# Patient Record
Sex: Female | Born: 1935 | Race: Black or African American | Hispanic: No | Smoking: Never smoker
Health system: Southern US, Community
[De-identification: ages and names within clinical notes are randomized; demographics above are authoritative.]

## PROBLEM LIST (undated history)

## (undated) DIAGNOSIS — B029 Zoster without complications: Secondary | ICD-10-CM

## (undated) DIAGNOSIS — H409 Unspecified glaucoma: Secondary | ICD-10-CM

## (undated) DIAGNOSIS — M1990 Unspecified osteoarthritis, unspecified site: Secondary | ICD-10-CM

## (undated) DIAGNOSIS — N63 Unspecified lump in unspecified breast: Secondary | ICD-10-CM

## (undated) DIAGNOSIS — E119 Type 2 diabetes mellitus without complications: Secondary | ICD-10-CM

## (undated) DIAGNOSIS — I10 Essential (primary) hypertension: Secondary | ICD-10-CM

## (undated) DIAGNOSIS — Z1239 Encounter for other screening for malignant neoplasm of breast: Secondary | ICD-10-CM

## (undated) DIAGNOSIS — Z87442 Personal history of urinary calculi: Secondary | ICD-10-CM

## (undated) DIAGNOSIS — M109 Gout, unspecified: Secondary | ICD-10-CM

## (undated) DIAGNOSIS — M810 Age-related osteoporosis without current pathological fracture: Secondary | ICD-10-CM

## (undated) DIAGNOSIS — K219 Gastro-esophageal reflux disease without esophagitis: Secondary | ICD-10-CM

## (undated) DIAGNOSIS — I499 Cardiac arrhythmia, unspecified: Secondary | ICD-10-CM

## (undated) DIAGNOSIS — D649 Anemia, unspecified: Secondary | ICD-10-CM

## (undated) DIAGNOSIS — E669 Obesity, unspecified: Secondary | ICD-10-CM

## (undated) DIAGNOSIS — Z1211 Encounter for screening for malignant neoplasm of colon: Secondary | ICD-10-CM

## (undated) DIAGNOSIS — R928 Other abnormal and inconclusive findings on diagnostic imaging of breast: Secondary | ICD-10-CM

## (undated) DIAGNOSIS — E785 Hyperlipidemia, unspecified: Secondary | ICD-10-CM

## (undated) HISTORY — PX: CATARACT EXTRACTION, BILATERAL: SHX1313

## (undated) HISTORY — DX: Other abnormal and inconclusive findings on diagnostic imaging of breast: R92.8

## (undated) HISTORY — DX: Type 2 diabetes mellitus without complications: E11.9

## (undated) HISTORY — PX: KNEE SURGERY: SHX244

## (undated) HISTORY — DX: Encounter for screening for malignant neoplasm of colon: Z12.11

## (undated) HISTORY — DX: Zoster without complications: B02.9

## (undated) HISTORY — DX: Age-related osteoporosis without current pathological fracture: M81.0

## (undated) HISTORY — PX: BACK SURGERY: SHX140

## (undated) HISTORY — PX: TONSILLECTOMY: SUR1361

## (undated) HISTORY — PX: EYE SURGERY: SHX253

## (undated) HISTORY — PX: CHOLECYSTECTOMY: SHX55

## (undated) HISTORY — PX: CORONARY ARTERY BYPASS GRAFT: SHX141

## (undated) HISTORY — DX: Essential (primary) hypertension: I10

## (undated) HISTORY — DX: Gastro-esophageal reflux disease without esophagitis: K21.9

## (undated) HISTORY — DX: Hyperlipidemia, unspecified: E78.5

## (undated) HISTORY — DX: Unspecified lump in unspecified breast: N63.0

## (undated) HISTORY — DX: Unspecified glaucoma: H40.9

## (undated) HISTORY — PX: BREAST EXCISIONAL BIOPSY: SUR124

## (undated) HISTORY — DX: Encounter for other screening for malignant neoplasm of breast: Z12.39

## (undated) HISTORY — DX: Obesity, unspecified: E66.9

---
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
## 1978-10-21 DIAGNOSIS — I10 Essential (primary) hypertension: Secondary | ICD-10-CM

## 1978-10-21 HISTORY — DX: Essential (primary) hypertension: I10

## 1996-02-20 DIAGNOSIS — E119 Type 2 diabetes mellitus without complications: Secondary | ICD-10-CM

## 1996-02-20 HISTORY — PX: LIPOMA EXCISION: SHX5283

## 1996-02-20 HISTORY — DX: Type 2 diabetes mellitus without complications: E11.9

## 2001-02-19 DIAGNOSIS — H409 Unspecified glaucoma: Secondary | ICD-10-CM

## 2001-02-19 HISTORY — DX: Unspecified glaucoma: H40.9

## 2001-02-19 HISTORY — PX: COLONOSCOPY: SHX174

## 2002-02-19 HISTORY — PX: SPINE SURGERY: SHX786

## 2004-05-27 ENCOUNTER — Emergency Department: Payer: Self-pay | Admitting: Emergency Medicine

## 2004-06-07 ENCOUNTER — Encounter: Payer: Self-pay | Admitting: Family Medicine

## 2004-06-07 ENCOUNTER — Emergency Department: Payer: Self-pay | Admitting: Emergency Medicine

## 2004-06-13 ENCOUNTER — Ambulatory Visit: Payer: Self-pay | Admitting: Family Medicine

## 2004-06-19 ENCOUNTER — Encounter: Payer: Self-pay | Admitting: Family Medicine

## 2004-07-20 ENCOUNTER — Encounter: Payer: Self-pay | Admitting: Family Medicine

## 2004-11-05 ENCOUNTER — Emergency Department: Payer: Self-pay | Admitting: Internal Medicine

## 2005-07-24 ENCOUNTER — Ambulatory Visit: Payer: Self-pay | Admitting: Specialist

## 2005-08-02 ENCOUNTER — Ambulatory Visit: Payer: Self-pay | Admitting: Specialist

## 2005-08-02 ENCOUNTER — Other Ambulatory Visit: Payer: Self-pay

## 2005-08-10 ENCOUNTER — Ambulatory Visit: Payer: Self-pay | Admitting: Specialist

## 2006-02-19 DIAGNOSIS — E785 Hyperlipidemia, unspecified: Secondary | ICD-10-CM

## 2006-02-19 HISTORY — DX: Hyperlipidemia, unspecified: E78.5

## 2007-02-24 ENCOUNTER — Ambulatory Visit: Payer: Self-pay | Admitting: Specialist

## 2007-03-04 ENCOUNTER — Inpatient Hospital Stay: Payer: Self-pay | Admitting: Specialist

## 2007-05-28 ENCOUNTER — Ambulatory Visit: Payer: Self-pay | Admitting: Family Medicine

## 2008-10-14 ENCOUNTER — Ambulatory Visit: Payer: Self-pay | Admitting: Specialist

## 2009-04-19 ENCOUNTER — Emergency Department: Payer: Self-pay | Admitting: Emergency Medicine

## 2009-07-18 ENCOUNTER — Encounter: Payer: Self-pay | Admitting: Internal Medicine

## 2009-07-20 ENCOUNTER — Encounter: Payer: Self-pay | Admitting: Internal Medicine

## 2009-08-19 ENCOUNTER — Encounter: Payer: Self-pay | Admitting: Internal Medicine

## 2010-06-28 ENCOUNTER — Encounter: Payer: Self-pay | Admitting: Orthopaedic Surgery

## 2010-07-04 ENCOUNTER — Ambulatory Visit: Payer: Self-pay | Admitting: Family Medicine

## 2010-07-21 ENCOUNTER — Encounter: Payer: Self-pay | Admitting: Orthopaedic Surgery

## 2010-12-04 ENCOUNTER — Encounter: Payer: Self-pay | Admitting: Orthopaedic Surgery

## 2010-12-21 ENCOUNTER — Encounter: Payer: Self-pay | Admitting: Orthopaedic Surgery

## 2011-01-20 ENCOUNTER — Encounter: Payer: Self-pay | Admitting: Orthopaedic Surgery

## 2011-02-20 DIAGNOSIS — R928 Other abnormal and inconclusive findings on diagnostic imaging of breast: Secondary | ICD-10-CM

## 2011-02-20 DIAGNOSIS — Z1211 Encounter for screening for malignant neoplasm of colon: Secondary | ICD-10-CM

## 2011-02-20 DIAGNOSIS — B029 Zoster without complications: Secondary | ICD-10-CM

## 2011-02-20 DIAGNOSIS — E669 Obesity, unspecified: Secondary | ICD-10-CM

## 2011-02-20 DIAGNOSIS — Z1239 Encounter for other screening for malignant neoplasm of breast: Secondary | ICD-10-CM

## 2011-02-20 HISTORY — DX: Encounter for other screening for malignant neoplasm of breast: Z12.39

## 2011-02-20 HISTORY — DX: Obesity, unspecified: E66.9

## 2011-02-20 HISTORY — DX: Encounter for screening for malignant neoplasm of colon: Z12.11

## 2011-02-20 HISTORY — DX: Zoster without complications: B02.9

## 2011-02-20 HISTORY — DX: Other abnormal and inconclusive findings on diagnostic imaging of breast: R92.8

## 2011-06-14 ENCOUNTER — Ambulatory Visit: Payer: Self-pay | Admitting: Family Medicine

## 2011-06-21 ENCOUNTER — Ambulatory Visit: Payer: Self-pay | Admitting: Family Medicine

## 2011-07-28 ENCOUNTER — Emergency Department: Payer: Self-pay | Admitting: Emergency Medicine

## 2011-08-27 ENCOUNTER — Ambulatory Visit: Payer: Self-pay | Admitting: Family Medicine

## 2011-11-21 ENCOUNTER — Ambulatory Visit: Payer: Self-pay | Admitting: Family Medicine

## 2011-12-26 ENCOUNTER — Ambulatory Visit: Payer: Self-pay | Admitting: General Surgery

## 2011-12-31 ENCOUNTER — Ambulatory Visit: Payer: Self-pay | Admitting: Family Medicine

## 2012-01-03 DIAGNOSIS — N63 Unspecified lump in unspecified breast: Secondary | ICD-10-CM

## 2012-01-03 HISTORY — DX: Unspecified lump in unspecified breast: N63.0

## 2012-01-04 ENCOUNTER — Ambulatory Visit: Payer: Self-pay | Admitting: Family Medicine

## 2012-01-25 HISTORY — PX: BREAST BIOPSY: SHX20

## 2012-03-26 ENCOUNTER — Ambulatory Visit: Payer: Self-pay | Admitting: Family Medicine

## 2012-05-21 ENCOUNTER — Encounter: Payer: Self-pay | Admitting: General Surgery

## 2012-06-02 ENCOUNTER — Encounter: Payer: Self-pay | Admitting: *Deleted

## 2012-06-02 DIAGNOSIS — N6310 Unspecified lump in the right breast, unspecified quadrant: Secondary | ICD-10-CM | POA: Insufficient documentation

## 2012-06-02 DIAGNOSIS — N63 Unspecified lump in unspecified breast: Secondary | ICD-10-CM | POA: Insufficient documentation

## 2012-06-11 ENCOUNTER — Ambulatory Visit: Payer: Self-pay | Admitting: Family Medicine

## 2012-06-24 ENCOUNTER — Ambulatory Visit: Payer: Self-pay | Admitting: General Surgery

## 2012-06-25 ENCOUNTER — Encounter: Payer: Self-pay | Admitting: General Surgery

## 2012-07-09 ENCOUNTER — Ambulatory Visit (INDEPENDENT_AMBULATORY_CARE_PROVIDER_SITE_OTHER): Payer: Medicare Other | Admitting: General Surgery

## 2012-07-09 ENCOUNTER — Encounter: Payer: Self-pay | Admitting: General Surgery

## 2012-07-09 VITALS — BP 140/74 | HR 72 | Resp 14 | Ht 62.0 in | Wt 206.0 lb

## 2012-07-09 DIAGNOSIS — N63 Unspecified lump in unspecified breast: Secondary | ICD-10-CM

## 2012-07-09 NOTE — Progress Notes (Signed)
Patient ID: Taylor [name], female   DOB: 1932/11/04, 79 y.o.   MRN: [id number]  Chief Complaint  Patient presents with  . Follow-up    mammogram    HPI Taylor [name] is a 79 y.o. female here today for her follow up mammogram done on 06/24/12 cat 2. Patient dose perform self breast checks and get regular mammograms.No family history of breast cancers. Patient has not problems at this time. HPI  Past Medical History  Diagnosis Date  . Diabetes mellitus without complication 1998    non insulin dependent  . Glaucoma 2003  . Hypertension 1980's  . Abnormal mammogram, unspecified 2013  . Shingles 2013  . Osteoporosis   . Breast screening, unspecified 2013  . Lump or mass in breast 01/03/2012    left breast  . Special screening for malignant neoplasms, colon 2013  . Obesity, unspecified 2013  . Hyperlipidemia 2008  . GERD (gastroesophageal reflux disease)     Past Surgical History  Procedure Laterality Date  . Abdominal hysterectomy  1958    menorrhagia  . Knee surgery  2009,2011,2012    twice on right and once on left  . Lipoma excision  1998  . Spine surgery  2004  . Tonsillectomy      age of 53  . Colonoscopy  2003    [facility]  . Coronary artery bypass graft Left 01/03/2012,01/24/2012    Left FNA, Left breast [facility] bx    Family History  Problem Relation Age of Onset  . Cancer Mother     skin cancer    Social History History  Substance Use Topics  . Smoking status: Never Smoker   . Smokeless tobacco: Never Used  . Alcohol Use: No    No Known Allergies  Current Outpatient Prescriptions  Medication Sig Dispense Refill  . amLODipine-benazepril (LOTREL) 10-20 MG per capsule Take 1 capsule by mouth daily.      . bimatoprost (LUMIGAN) 0.01 % SOLN 1 drop daily.      . metFORMIN (GLUCOPHAGE) 500 MG tablet Take 500 mg by mouth daily.      . pravastatin (PRAVACHOL) 40 MG tablet Take 40 mg by mouth daily.      . quinapril (ACCUPRIL) 40 MG tablet Take 40 mg by mouth daily.        No current facility-administered medications for this visit.    Review of Systems Review of Systems  Constitutional: Negative.   Respiratory: Negative.   Cardiovascular: Negative.     Blood pressure 140/74, pulse 72, resp. rate 14, height 5\' 2"  (1.575 m), weight 206 lb (93.441 kg).  Physical Exam Physical Exam  Constitutional: She appears well-developed and well-nourished.  Eyes: Conjunctivae are normal.  Neck: Neck supple.  Cardiovascular: Normal rate and normal heart sounds.   Pulmonary/Chest: Breath sounds normal.   Right breast exhibits no inverted nipple, no mass, no nipple discharge, no skin change and no tenderness. Left breast exhibits no inverted nipple, no mass, no nipple discharge, no skin change and no tenderness. Breasts are asymmetrical (right bigger than left two cup sizes differents.).  8 cm long scar on back   Lymphadenopathy:    She has no cervical adenopathy.    She has no axillary adenopathy.    Data Reviewed Bilateral mammograms dated 06/24/2012 showed the previously identified nodular density in the left breast to be associated with a biopsy clip. No interval new findings. BI-RAD-2.  The left breast biopsy dated 12 6, 2013 showed fibroadenomatous changes with usual ductal hyperplasia.  No atypia or malignancy. Assessment    Benign breast exam    Plan    The patient will continue annual clinical and mammographic examinations with her primary care provider, Dennison Mascot, M.D.       Earline Mayotte 07/10/2012, 7:09 AM

## 2012-07-09 NOTE — Patient Instructions (Signed)
Patient to return as needed. 

## 2012-07-10 ENCOUNTER — Encounter: Payer: Self-pay | Admitting: General Surgery

## 2012-07-10 DIAGNOSIS — K429 Umbilical hernia without obstruction or gangrene: Secondary | ICD-10-CM | POA: Insufficient documentation

## 2012-07-10 DIAGNOSIS — K5000 Crohn's disease of small intestine without complications: Secondary | ICD-10-CM | POA: Insufficient documentation

## 2012-09-02 ENCOUNTER — Ambulatory Visit: Payer: Self-pay | Admitting: Family Medicine

## 2013-06-29 ENCOUNTER — Ambulatory Visit: Payer: Self-pay | Admitting: Family Medicine

## 2013-07-06 ENCOUNTER — Ambulatory Visit: Payer: Self-pay | Admitting: Family Medicine

## 2013-08-13 ENCOUNTER — Ambulatory Visit: Payer: Self-pay | Admitting: Family Medicine

## 2013-09-08 ENCOUNTER — Ambulatory Visit: Payer: Self-pay | Admitting: Family Medicine

## 2013-12-21 ENCOUNTER — Encounter: Payer: Self-pay | Admitting: General Surgery

## 2014-03-16 DIAGNOSIS — E780 Pure hypercholesterolemia: Secondary | ICD-10-CM | POA: Diagnosis not present

## 2014-03-16 DIAGNOSIS — E669 Obesity, unspecified: Secondary | ICD-10-CM | POA: Diagnosis not present

## 2014-03-16 DIAGNOSIS — E119 Type 2 diabetes mellitus without complications: Secondary | ICD-10-CM | POA: Diagnosis not present

## 2014-03-16 DIAGNOSIS — E1140 Type 2 diabetes mellitus with diabetic neuropathy, unspecified: Secondary | ICD-10-CM | POA: Diagnosis not present

## 2014-03-16 DIAGNOSIS — I10 Essential (primary) hypertension: Secondary | ICD-10-CM | POA: Diagnosis not present

## 2014-03-29 ENCOUNTER — Ambulatory Visit: Payer: Self-pay | Admitting: Family Medicine

## 2014-03-29 DIAGNOSIS — Z713 Dietary counseling and surveillance: Secondary | ICD-10-CM | POA: Diagnosis not present

## 2014-03-29 DIAGNOSIS — E119 Type 2 diabetes mellitus without complications: Secondary | ICD-10-CM | POA: Diagnosis not present

## 2014-04-20 ENCOUNTER — Ambulatory Visit
Admit: 2014-04-20 | Disposition: A | Discharge status: Home / Self Care | Payer: Self-pay | Attending: Family Medicine | Admitting: Family Medicine

## 2014-05-03 DIAGNOSIS — Z96652 Presence of left artificial knee joint: Secondary | ICD-10-CM | POA: Diagnosis not present

## 2014-05-03 DIAGNOSIS — Z471 Aftercare following joint replacement surgery: Secondary | ICD-10-CM | POA: Diagnosis not present

## 2014-05-03 DIAGNOSIS — M25561 Pain in right knee: Secondary | ICD-10-CM | POA: Diagnosis not present

## 2014-05-03 DIAGNOSIS — Z96651 Presence of right artificial knee joint: Secondary | ICD-10-CM | POA: Diagnosis not present

## 2014-05-03 DIAGNOSIS — M79671 Pain in right foot: Secondary | ICD-10-CM | POA: Diagnosis not present

## 2014-05-20 DIAGNOSIS — H4011X3 Primary open-angle glaucoma, severe stage: Secondary | ICD-10-CM | POA: Diagnosis not present

## 2014-05-20 DIAGNOSIS — H2513 Age-related nuclear cataract, bilateral: Secondary | ICD-10-CM | POA: Diagnosis not present

## 2014-05-20 DIAGNOSIS — H524 Presbyopia: Secondary | ICD-10-CM | POA: Diagnosis not present

## 2014-06-10 DIAGNOSIS — I10 Essential (primary) hypertension: Secondary | ICD-10-CM | POA: Diagnosis not present

## 2014-06-10 DIAGNOSIS — G894 Chronic pain syndrome: Secondary | ICD-10-CM | POA: Diagnosis not present

## 2014-06-10 DIAGNOSIS — E1140 Type 2 diabetes mellitus with diabetic neuropathy, unspecified: Secondary | ICD-10-CM | POA: Diagnosis not present

## 2014-06-10 DIAGNOSIS — E780 Pure hypercholesterolemia: Secondary | ICD-10-CM | POA: Diagnosis not present

## 2014-06-10 DIAGNOSIS — G629 Polyneuropathy, unspecified: Secondary | ICD-10-CM | POA: Diagnosis not present

## 2014-06-21 ENCOUNTER — Other Ambulatory Visit: Payer: Self-pay

## 2014-06-21 DIAGNOSIS — Z1231 Encounter for screening mammogram for malignant neoplasm of breast: Secondary | ICD-10-CM

## 2014-07-09 ENCOUNTER — Other Ambulatory Visit: Payer: Self-pay

## 2014-07-09 ENCOUNTER — Ambulatory Visit
Admission: RE | Admit: 2014-07-09 | Discharge: 2014-07-09 | Disposition: A | Discharge status: Home / Self Care | Payer: Medicare Other | Source: Ambulatory Visit | Attending: Family Medicine | Admitting: Family Medicine

## 2014-07-09 DIAGNOSIS — Z1231 Encounter for screening mammogram for malignant neoplasm of breast: Secondary | ICD-10-CM

## 2014-07-20 ENCOUNTER — Other Ambulatory Visit: Payer: Self-pay | Admitting: Family Medicine

## 2014-07-20 DIAGNOSIS — R928 Other abnormal and inconclusive findings on diagnostic imaging of breast: Secondary | ICD-10-CM

## 2014-07-20 DIAGNOSIS — N63 Unspecified lump in unspecified breast: Secondary | ICD-10-CM

## 2014-07-23 ENCOUNTER — Ambulatory Visit
Admission: RE | Admit: 2014-07-23 | Discharge: 2014-07-23 | Disposition: A | Discharge status: Home / Self Care | Payer: Medicare Other | Source: Ambulatory Visit | Attending: Family Medicine | Admitting: Family Medicine

## 2014-07-23 DIAGNOSIS — N63 Unspecified lump in unspecified breast: Secondary | ICD-10-CM

## 2014-07-23 DIAGNOSIS — N6002 Solitary cyst of left breast: Secondary | ICD-10-CM | POA: Diagnosis not present

## 2014-07-23 DIAGNOSIS — R928 Other abnormal and inconclusive findings on diagnostic imaging of breast: Secondary | ICD-10-CM

## 2014-08-16 ENCOUNTER — Other Ambulatory Visit: Payer: Self-pay | Admitting: Family Medicine

## 2014-09-09 ENCOUNTER — Ambulatory Visit (INDEPENDENT_AMBULATORY_CARE_PROVIDER_SITE_OTHER): Payer: Medicare Other | Admitting: Family Medicine

## 2014-09-09 ENCOUNTER — Encounter: Payer: Self-pay | Admitting: Family Medicine

## 2014-09-09 VITALS — BP 140/72 | HR 93 | Temp 97.1°F | Resp 16 | Ht 60.0 in | Wt 208.9 lb

## 2014-09-09 DIAGNOSIS — G8929 Other chronic pain: Secondary | ICD-10-CM

## 2014-09-09 DIAGNOSIS — I10 Essential (primary) hypertension: Secondary | ICD-10-CM | POA: Diagnosis not present

## 2014-09-09 DIAGNOSIS — N183 Chronic kidney disease, stage 3 (moderate): Secondary | ICD-10-CM

## 2014-09-09 DIAGNOSIS — E1129 Type 2 diabetes mellitus with other diabetic kidney complication: Secondary | ICD-10-CM | POA: Insufficient documentation

## 2014-09-09 DIAGNOSIS — M159 Polyosteoarthritis, unspecified: Secondary | ICD-10-CM | POA: Insufficient documentation

## 2014-09-09 DIAGNOSIS — B354 Tinea corporis: Secondary | ICD-10-CM

## 2014-09-09 DIAGNOSIS — R809 Proteinuria, unspecified: Secondary | ICD-10-CM

## 2014-09-09 DIAGNOSIS — H409 Unspecified glaucoma: Secondary | ICD-10-CM | POA: Insufficient documentation

## 2014-09-09 DIAGNOSIS — I129 Hypertensive chronic kidney disease with stage 1 through stage 4 chronic kidney disease, or unspecified chronic kidney disease: Secondary | ICD-10-CM | POA: Insufficient documentation

## 2014-09-09 MED ORDER — TERBINAFINE HCL 250 MG PO TABS: 250.0000 mg | ORAL_TABLET | Freq: Every day | ORAL | Status: DC

## 2014-09-09 MED ORDER — OXYCODONE-ACETAMINOPHEN 7.5-325 MG PO TABS: 1.0000 | ORAL_TABLET | Freq: Three times a day (TID) | ORAL | Status: DC | PRN

## 2014-09-09 NOTE — Progress Notes (Signed)
Name: Taylor [name]   MRN: [id number]    DOB: [birth year]   Date:09/09/2014       Progress Note  Subjective  Chief Complaint  Chief Complaint  Patient presents with  . Back Pain    chronic pain management  . Rash    skin discoloration around chin for 2 months    Back Pain This is a chronic problem. The current episode started more than 1 year ago. The problem occurs constantly. The problem is unchanged. The pain is present in the lumbar spine. The quality of the pain is described as stabbing. The pain is moderate. The pain is worse during the night. The symptoms are aggravated by bending, coughing and twisting. Pertinent negatives include no chest pain, dysuria, fever, headaches, tingling, weakness or weight loss. Risk factors include history of osteoporosis and sedentary lifestyle. She has tried NSAIDs and muscle relaxant for the symptoms. The treatment provided mild relief.  Rash Associated symptoms include joint pain. Pertinent negatives include no congestion, cough, diarrhea, fever, shortness of breath, sore throat or vomiting.  Hypertension This is a chronic problem. The current episode started more than 1 year ago. The problem is unchanged. The problem is controlled. Associated symptoms include anxiety. Pertinent negatives include no blurred vision, chest pain, headaches, neck pain, orthopnea, palpitations or shortness of breath. (Chronic pain) Risk factors for coronary artery disease include diabetes mellitus, dyslipidemia, obesity and sedentary lifestyle. Past treatments include ACE inhibitors and calcium channel blockers. Hypertensive end-organ damage includes kidney disease.   patient is noted a facial rash for the past several weeks. It is slightly pruritic. She already has a dermatology appointment.    Past Medical History  Diagnosis Date  . Diabetes mellitus without complication 4239    non insulin dependent  . Glaucoma 2003  . Hypertension 1980's  . Abnormal mammogram,  unspecified 2013  . Shingles 2013  . Osteoporosis   . Breast screening, unspecified 2013  . Lump or mass in breast 01/03/2012    left breast  . Special screening for malignant neoplasms, colon 2013  . Obesity, unspecified 2013  . Hyperlipidemia 2008  . GERD (gastroesophageal reflux disease)     History  Substance Use Topics  . Smoking status: Never Smoker   . Smokeless tobacco: Never Used  . Alcohol Use: No     Current outpatient prescriptions:  .  fluticasone (FLONASE) 50 MCG/ACT nasal spray, Place into the nose., Disp: , Rfl:  .  alendronate (FOSAMAX) 70 MG tablet, TAKE 1 TABLET WEEKLY, Disp: 4 tablet, Rfl: 11 .  amLODipine-benazepril (LOTREL) 10-20 MG per capsule, Take 1 capsule by mouth daily., Disp: , Rfl:  .  bimatoprost (LUMIGAN) 0.01 % SOLN, 1 drop daily., Disp: , Rfl:  .  Cholecalciferol (VITAMIN D-1000 MAX ST) 1000 UNITS tablet, Take by mouth., Disp: , Rfl:  .  metFORMIN (GLUCOPHAGE) 500 MG tablet, Take 500 mg by mouth daily., Disp: , Rfl:  .  omeprazole (PRILOSEC) 20 MG capsule, Take by mouth., Disp: , Rfl:  .  oxyCODONE-acetaminophen (PERCOCET) 7.5-325 MG per tablet, Take by mouth., Disp: , Rfl:  .  pravastatin (PRAVACHOL) 40 MG tablet, Take 40 mg by mouth daily., Disp: , Rfl:  .  quinapril (ACCUPRIL) 40 MG tablet, Take 40 mg by mouth daily., Disp: , Rfl:   No Known Allergies  Review of Systems  Constitutional: Negative for fever, chills and weight loss.  HENT: Negative for congestion, hearing loss, sore throat and tinnitus.   Eyes: Negative for blurred  vision, double vision and redness.  Respiratory: Negative for cough, hemoptysis and shortness of breath.   Cardiovascular: Negative for chest pain, palpitations, orthopnea, claudication and leg swelling.  Gastrointestinal: Negative for heartburn, nausea, vomiting, diarrhea, constipation and blood in stool.  Genitourinary: Negative for dysuria, urgency, frequency and hematuria.  Musculoskeletal: Positive for back  pain and joint pain. Negative for myalgias, falls and neck pain.  Skin: Positive for rash. Negative for itching.  Neurological: Negative for dizziness, tingling, tremors, focal weakness, seizures, loss of consciousness, weakness and headaches.  Endo/Heme/Allergies: Does not bruise/bleed easily.  Psychiatric/Behavioral: Negative for depression and substance abuse. The patient is not nervous/anxious and does not have insomnia.      Objective  Filed Vitals:   09/09/14 1110  BP: 140/72  Pulse: 93  Temp: 97.1 F (36.2 C)  TempSrc: Oral  Resp: 16  Height: 5' (1.524 m)  Weight: 208 lb 14.4 oz (94.756 kg)  SpO2: 97%     Physical Exam  Constitutional: She is oriented to person, place, and time and well-developed, well-nourished, and in no distress.  HENT:  Head: Normocephalic.  Eyes: EOM are normal. Pupils are equal, round, and reactive to light.  Neck: Normal range of motion. No thyromegaly present.  Cardiovascular: Normal rate, regular rhythm and normal heart sounds.   No murmur heard. Pulmonary/Chest: Effort normal and breath sounds normal.  Abdominal: Soft. Bowel sounds are normal.  Musculoskeletal: Normal range of motion. She exhibits no edema.  Neurological: She is alert and oriented to person, place, and time. No cranial nerve deficit. Gait normal.  Skin: Skin is warm and dry. Rash noted.  Scaly hypopigmented rash noted on the face  Psychiatric: Memory and affect normal.      Assessment & Plan  1. Chronic pain Stable - oxyCODONE-acetaminophen (PERCOCET) 7.5-325 MG per tablet; Take 1 tablet by mouth every 8 (eight) hours as needed for severe pain.  Dispense: 90 tablet; Refill: 0 - oxyCODONE-acetaminophen (PERCOCET) 7.5-325 MG per tablet; Take 1 tablet by mouth every 8 (eight) hours as needed for severe pain.  Dispense: 90 tablet; Refill: 0  2. Tinea corporis New-onset. Dermatology if not resolving with current meds - terbinafine (LAMISIL) 250 MG tablet; Take 1 tablet  (250 mg total) by mouth daily.  Dispense: 10 tablet; Refill: 0  3. Essential hypertension Stable

## 2014-09-09 NOTE — Patient Instructions (Signed)

## 2014-09-27 DIAGNOSIS — M79662 Pain in left lower leg: Secondary | ICD-10-CM | POA: Diagnosis not present

## 2014-09-27 DIAGNOSIS — Z471 Aftercare following joint replacement surgery: Secondary | ICD-10-CM | POA: Diagnosis not present

## 2014-09-27 DIAGNOSIS — Z96653 Presence of artificial knee joint, bilateral: Secondary | ICD-10-CM | POA: Diagnosis not present

## 2014-09-27 DIAGNOSIS — R600 Localized edema: Secondary | ICD-10-CM | POA: Diagnosis not present

## 2014-09-27 DIAGNOSIS — M79661 Pain in right lower leg: Secondary | ICD-10-CM | POA: Diagnosis not present

## 2014-09-27 DIAGNOSIS — Z96651 Presence of right artificial knee joint: Secondary | ICD-10-CM | POA: Diagnosis not present

## 2014-10-06 DIAGNOSIS — L818 Other specified disorders of pigmentation: Secondary | ICD-10-CM | POA: Diagnosis not present

## 2014-10-07 ENCOUNTER — Other Ambulatory Visit: Payer: Self-pay | Admitting: Family Medicine

## 2014-10-11 ENCOUNTER — Other Ambulatory Visit: Payer: Self-pay | Admitting: Emergency Medicine

## 2014-10-11 DIAGNOSIS — G8929 Other chronic pain: Secondary | ICD-10-CM

## 2014-10-11 MED ORDER — OXYCODONE-ACETAMINOPHEN 7.5-325 MG PO TABS: 1.0000 | ORAL_TABLET | Freq: Three times a day (TID) | ORAL | Status: DC | PRN

## 2014-10-11 NOTE — Telephone Encounter (Signed)
Patient called and stated she need a refill on her pain medication. Has lost previous script.

## 2014-11-10 ENCOUNTER — Ambulatory Visit (INDEPENDENT_AMBULATORY_CARE_PROVIDER_SITE_OTHER): Payer: Medicare Other | Admitting: Family Medicine

## 2014-11-10 ENCOUNTER — Encounter: Payer: Self-pay | Admitting: Family Medicine

## 2014-11-10 VITALS — BP 126/72 | HR 102 | Temp 98.1°F | Resp 16 | Ht 60.0 in | Wt 210.5 lb

## 2014-11-10 DIAGNOSIS — B354 Tinea corporis: Secondary | ICD-10-CM | POA: Diagnosis not present

## 2014-11-10 DIAGNOSIS — I10 Essential (primary) hypertension: Secondary | ICD-10-CM

## 2014-11-10 DIAGNOSIS — E1129 Type 2 diabetes mellitus with other diabetic kidney complication: Secondary | ICD-10-CM | POA: Diagnosis not present

## 2014-11-10 DIAGNOSIS — G8929 Other chronic pain: Secondary | ICD-10-CM | POA: Diagnosis not present

## 2014-11-10 DIAGNOSIS — Z23 Encounter for immunization: Secondary | ICD-10-CM | POA: Diagnosis not present

## 2014-11-10 DIAGNOSIS — E1165 Type 2 diabetes mellitus with hyperglycemia: Secondary | ICD-10-CM

## 2014-11-10 DIAGNOSIS — N200 Calculus of kidney: Secondary | ICD-10-CM | POA: Insufficient documentation

## 2014-11-10 DIAGNOSIS — IMO0002 Reserved for concepts with insufficient information to code with codable children: Secondary | ICD-10-CM

## 2014-11-10 LAB — GLUCOSE, POCT (MANUAL RESULT ENTRY): POC Glucose: 138 mg/dl — AB (ref 70–99)

## 2014-11-10 LAB — POCT GLYCOSYLATED HEMOGLOBIN (HGB A1C): Hemoglobin A1C: 7.3

## 2014-11-10 MED ORDER — OXYCODONE-ACETAMINOPHEN 7.5-325 MG PO TABS
1.0000 | ORAL_TABLET | Freq: Three times a day (TID) | ORAL | Status: DC | PRN
Start: 2014-11-10 — End: 2015-08-05

## 2014-11-10 MED ORDER — OXYCODONE-ACETAMINOPHEN 7.5-325 MG PO TABS
1.0000 | ORAL_TABLET | Freq: Three times a day (TID) | ORAL | Status: DC | PRN
Start: 2014-11-10 — End: 2015-02-11

## 2014-11-10 MED ORDER — TERBINAFINE HCL 250 MG PO TABS: 250.0000 mg | ORAL_TABLET | Freq: Every day | ORAL | Status: DC

## 2014-11-10 MED ORDER — OXYCODONE-ACETAMINOPHEN 7.5-325 MG PO TABS: 1.0000 | ORAL_TABLET | Freq: Three times a day (TID) | ORAL | Status: DC | PRN

## 2014-11-10 NOTE — Progress Notes (Signed)
Name: Taylor [name]   MRN: [id number]    DOB: Mar 24, 1932   Date:11/10/2014       Progress Note  Subjective  Chief Complaint  Chief Complaint  Patient presents with  . Back Pain    chronic med refill 2 month recheck    HPI  Chronic pain  Patient presents for follow-up of chronic pain syndrome. The pain score at worse is 7/10  and at best is 5/10 with medication rest and home remedies.  There is no evidence of any extra dosage or issues of usage outside of the prescribed regimen.  Pain is exacerbated by changes in weather and by strenuous activities. There have been no recent activities to exacerbate the chronic pain. Concomitant medications include Percocet every 8 hours   .  Drug screen and medication contract have been renewed within the last 1 year.    Hypertension   Patient presents for follow-up of hypertension. It has been present for over 5 years.  Patient states that there is compliance with medical regimen amlodipine and quinapril. Minimal exercise.   . There is no end organ disease. Cardiac risk factors include hypertension hyperlipidemia and diabetes.  Exercise regimen consist of minimal .  Diet consist of restricted salt .  Diabetes  Patient presents for follow-up of diabetes which is present for greater than 5 years. Is currently on a regimen of metformin daily . Patient states she is sometimes compliant with their diet and exercise. There's been no hypoglycemic episodes and there is no polyuria polydipsia polyphagia. His average fasting glucoses been in the low around 130s with a high around 140s . There is no end organ disease.  Last diabetic eye exam was earlier this year.   Last visit with dietitian was several years ago. Last microalbumin was obtained January 16 and was 100 .   Past Medical History  Diagnosis Date  . Diabetes mellitus without complication 1950    non insulin dependent  . Glaucoma 2003  . Hypertension 1980's  . Abnormal mammogram, unspecified 2013   . Shingles 2013  . Osteoporosis   . Breast screening, unspecified 2013  . Lump or mass in breast 01/03/2012    left breast  . Special screening for malignant neoplasms, colon 2013  . Obesity, unspecified 2013  . Hyperlipidemia 2008  . GERD (gastroesophageal reflux disease)     Social History  Substance Use Topics  . Smoking status: Never Smoker   . Smokeless tobacco: Never Used  . Alcohol Use: No     Current outpatient prescriptions:  .  alendronate (FOSAMAX) 70 MG tablet, TAKE 1 TABLET WEEKLY, Disp: 4 tablet, Rfl: 11 .  amLODipine-benazepril (LOTREL) 10-20 MG per capsule, Take 1 capsule by mouth daily., Disp: , Rfl:  .  bimatoprost (LUMIGAN) 0.01 % SOLN, 1 drop daily., Disp: , Rfl:  .  Cholecalciferol (VITAMIN D-1000 MAX ST) 1000 UNITS tablet, Take by mouth., Disp: , Rfl:  .  fluticasone (FLONASE) 50 MCG/ACT nasal spray, Place into the nose., Disp: , Rfl:  .  metFORMIN (GLUCOPHAGE) 500 MG tablet, TAKE 1 TABLET BY MOUTH DAILY, Disp: 90 tablet, Rfl: 1 .  omeprazole (PRILOSEC) 20 MG capsule, Take by mouth., Disp: , Rfl:  .  oxyCODONE-acetaminophen (PERCOCET) 7.5-325 MG per tablet, Take 1 tablet by mouth every 8 (eight) hours as needed for severe pain., Disp: 90 tablet, Rfl: 0 .  oxyCODONE-acetaminophen (PERCOCET) 7.5-325 MG per tablet, Take 1 tablet by mouth every 8 (eight) hours as needed for severe pain.,  Disp: 90 tablet, Rfl: 0 .  pravastatin (PRAVACHOL) 40 MG tablet, Take 40 mg by mouth daily., Disp: , Rfl:  .  quinapril (ACCUPRIL) 40 MG tablet, Take 40 mg by mouth daily., Disp: , Rfl:  .  terbinafine (LAMISIL) 250 MG tablet, Take 1 tablet (250 mg total) by mouth daily., Disp: 10 tablet, Rfl: 0  No Known Allergies  Review of Systems  Constitutional: Negative for fever, chills and weight loss.  HENT: Negative for congestion, hearing loss, sore throat and tinnitus.   Eyes: Negative for blurred vision, double vision and redness.  Respiratory: Negative for cough, hemoptysis and  shortness of breath.   Cardiovascular: Positive for leg swelling. Negative for chest pain, palpitations (on face which is responding to Lamisil), orthopnea and claudication.  Gastrointestinal: Negative for heartburn, nausea, vomiting, diarrhea, constipation and blood in stool.  Genitourinary: Negative for dysuria, urgency, frequency and hematuria.  Musculoskeletal: Positive for back pain and joint pain. Negative for myalgias, falls and neck pain.  Skin: Positive for rash. Negative for itching.  Neurological: Positive for weakness. Negative for dizziness, tingling, tremors, focal weakness, seizures, loss of consciousness and headaches.  Endo/Heme/Allergies: Does not bruise/bleed easily.  Psychiatric/Behavioral: Negative for depression and substance abuse. The patient has insomnia. The patient is not nervous/anxious.      Objective  Filed Vitals:   11/10/14 1056  BP: 126/72  Pulse: 102  Temp: 98.1 F (36.7 C)  TempSrc: Oral  Resp: 16  Height: 5' (1.524 m)  Weight: 210 lb 8 oz (95.482 kg)  SpO2: 97%     Physical Exam  Constitutional: She is oriented to person, place, and time and well-developed, well-nourished, and in no distress.  HENT:  Head: Normocephalic.  Eyes: EOM are normal. Pupils are equal, round, and reactive to light.  Neck: Normal range of motion. No thyromegaly present.  Cardiovascular: Normal rate, regular rhythm and normal heart sounds.   No murmur heard. Pulmonary/Chest: Effort normal and breath sounds normal.  Abdominal: Soft. Bowel sounds are normal.  Musculoskeletal: She exhibits tenderness (left flank area). She exhibits no edema.  Neurological: She is alert and oriented to person, place, and time. No cranial nerve deficit. Gait normal.  Skin: Skin is warm and dry. No rash noted.  Psychiatric: Memory and affect normal.      Assessment & Plan   1. DM (diabetes mellitus), type 2, uncontrolled, with renal complications  - POCT HgB A1C - POCT Glucose  (CBG)  2. Essential hypertension Well-controlled  3. Need for influenza vaccination Given today - Flu vaccine HIGH DOSE PF (Fluzone High dose)  4. Tinea corporis Treatment - terbinafine (LAMISIL) 250 MG tablet; Take 1 tablet (250 mg total) by mouth daily.  Dispense: 21 tablet; Refill: 0  5. Chronic pain Continue current meds and recheck in 3 months - oxyCODONE-acetaminophen (PERCOCET) 7.5-325 MG per tablet; Take 1 tablet by mouth every 8 (eight) hours as needed for severe pain.  Dispense: 90 tablet; Refill: 0 - oxyCODONE-acetaminophen (PERCOCET) 7.5-325 MG per tablet; Take 1 tablet by mouth every 8 (eight) hours as needed for severe pain.  Dispense: 90 tablet; Refill: 0 - oxyCODONE-acetaminophen (PERCOCET) 7.5-325 MG per tablet; Take 1 tablet by mouth every 8 (eight) hours as needed for severe pain.  Dispense: 90 tablet; Refill: 0 - oxyCODONE-acetaminophen (PERCOCET) 7.5-325 MG per tablet; Take 1 tablet by mouth every 8 (eight) hours as needed for severe pain.  Dispense: 90 tablet; Refill: 0

## 2014-11-16 ENCOUNTER — Other Ambulatory Visit: Payer: Self-pay | Admitting: Emergency Medicine

## 2014-11-16 NOTE — Telephone Encounter (Signed)
Patient called and stated she got a script for pain medication for October, November and December. Did not receive one for September. Per Dr. Ancil Boozer, we spoke to pharmacy and they filled the December script for September. Patient return to office with all scripts and Dr. Ancil Boozer sign ok to fill now on the December script. Patient will then be on track and return in December for follow up

## 2014-12-21 ENCOUNTER — Other Ambulatory Visit: Payer: Self-pay | Admitting: Family Medicine

## 2014-12-21 NOTE — Telephone Encounter (Signed)
Patient requesting refill. 

## 2014-12-23 ENCOUNTER — Other Ambulatory Visit: Payer: Self-pay

## 2014-12-23 DIAGNOSIS — K2100 Gastro-esophageal reflux disease with esophagitis, without bleeding: Secondary | ICD-10-CM

## 2014-12-23 DIAGNOSIS — E1142 Type 2 diabetes mellitus with diabetic polyneuropathy: Secondary | ICD-10-CM

## 2014-12-23 DIAGNOSIS — G629 Polyneuropathy, unspecified: Secondary | ICD-10-CM

## 2014-12-23 DIAGNOSIS — I10 Essential (primary) hypertension: Secondary | ICD-10-CM

## 2014-12-23 NOTE — Telephone Encounter (Signed)
Patient requesting refill. 

## 2014-12-24 ENCOUNTER — Other Ambulatory Visit: Payer: Self-pay

## 2014-12-24 MED ORDER — PRAVASTATIN SODIUM 40 MG PO TABS
40.0000 mg | ORAL_TABLET | Freq: Every day | ORAL | Status: DC
Start: 2014-12-24 — End: 2015-07-15

## 2014-12-24 NOTE — Telephone Encounter (Signed)
Patient requesting refill. 

## 2014-12-26 NOTE — Telephone Encounter (Signed)
What does she need rf for?

## 2014-12-27 NOTE — Telephone Encounter (Signed)
Patient is requesting a refill on Norvasc 10 mg, Quinapril 40 mg, Gabapentin, Prilosec and glucose test strips.

## 2014-12-28 NOTE — Telephone Encounter (Signed)
Refill each for a three-month supply with one refill

## 2014-12-29 MED ORDER — GLUCOSE BLOOD VI STRP: ORAL_STRIP | Status: DC

## 2014-12-29 MED ORDER — OMEPRAZOLE 20 MG PO CPDR: 20.0000 mg | DELAYED_RELEASE_CAPSULE | Freq: Every day | ORAL | Status: DC

## 2014-12-29 MED ORDER — "INSULIN SYRINGE-NEEDLE U-100 31G X 5/16"" 0.5 ML MISC": 100.0000 | Freq: Every day | Status: DC

## 2014-12-29 MED ORDER — AMLODIPINE BESY-BENAZEPRIL HCL 10-20 MG PO CAPS: 1.0000 | ORAL_CAPSULE | Freq: Every day | ORAL | Status: DC

## 2014-12-29 MED ORDER — QUINAPRIL HCL 40 MG PO TABS: 40.0000 mg | ORAL_TABLET | Freq: Every day | ORAL | Status: DC

## 2014-12-29 MED ORDER — GABAPENTIN 300 MG PO CAPS: 300.0000 mg | ORAL_CAPSULE | Freq: Three times a day (TID) | ORAL | Status: DC

## 2014-12-29 NOTE — Addendum Note (Signed)
Addended by: Vonna Kotyk L on: 12/29/2014 12:02 PM   Modules accepted: Orders, Medications

## 2014-12-30 NOTE — Telephone Encounter (Signed)
Refill all with 3 months with 1 refill

## 2015-01-06 ENCOUNTER — Telehealth: Payer: Self-pay | Admitting: Family Medicine

## 2015-01-06 NOTE — Telephone Encounter (Signed)
Pt would like a call back. She has some issues with some of her prescriptions and needs to speak to you.

## 2015-01-06 NOTE — Telephone Encounter (Signed)
Spoke to patient, She stated that she had scripts on her profile at the pharmacy that was not medication she was on. I informed patient I will contact pharmacy to get it straight

## 2015-02-09 ENCOUNTER — Ambulatory Visit: Payer: Medicare Other | Admitting: Family Medicine

## 2015-02-10 ENCOUNTER — Telehealth: Payer: Self-pay | Admitting: Family Medicine

## 2015-02-10 DIAGNOSIS — G8929 Other chronic pain: Secondary | ICD-10-CM

## 2015-02-10 NOTE — Telephone Encounter (Signed)
Dr Rutherford Nail Patient: She had appointment today and it was cancelled due to Dr being out of the office. She is completely out of Oxycodone acetaminophen 7.5-325 mg and would like to know if you are able to refill the medication for her. Please return call

## 2015-02-11 MED ORDER — OXYCODONE-ACETAMINOPHEN 7.5-325 MG PO TABS: 1.0000 | ORAL_TABLET | Freq: Three times a day (TID) | ORAL | Status: DC | PRN

## 2015-02-11 NOTE — Telephone Encounter (Signed)
Patient informed and she is on her way to pick it up. She also thanks you

## 2015-02-11 NOTE — Telephone Encounter (Signed)
Done, she needs to pick it up before we close

## 2015-02-17 ENCOUNTER — Ambulatory Visit: Payer: Medicare Other | Admitting: Family Medicine

## 2015-02-22 ENCOUNTER — Ambulatory Visit (INDEPENDENT_AMBULATORY_CARE_PROVIDER_SITE_OTHER): Payer: Medicare Other | Admitting: Family Medicine

## 2015-02-22 ENCOUNTER — Encounter: Payer: Self-pay | Admitting: Family Medicine

## 2015-02-22 VITALS — BP 130/68 | HR 88 | Temp 98.0°F | Resp 14 | Ht 60.0 in | Wt 209.9 lb

## 2015-02-22 DIAGNOSIS — G8929 Other chronic pain: Secondary | ICD-10-CM

## 2015-02-22 DIAGNOSIS — N898 Other specified noninflammatory disorders of vagina: Secondary | ICD-10-CM

## 2015-02-22 DIAGNOSIS — M1990 Unspecified osteoarthritis, unspecified site: Secondary | ICD-10-CM

## 2015-02-22 MED ORDER — OXYCODONE-ACETAMINOPHEN 7.5-325 MG PO TABS: 1.0000 | ORAL_TABLET | Freq: Three times a day (TID) | ORAL | Status: DC | PRN

## 2015-02-22 MED ORDER — ESTROGENS, CONJUGATED 0.625 MG/GM VA CREA: 1.0000 | TOPICAL_CREAM | Freq: Every day | VAGINAL | Status: DC

## 2015-02-22 NOTE — Progress Notes (Signed)
Name: Taylor [name]   MRN: [id number]    DOB: [birth year]   Date:02/22/2015       Progress Note  Subjective  Chief Complaint  Chief Complaint  Patient presents with  . Back Pain    chronic pain med refill 3 month recheck    HPI  Chronic pain  Patient presents for follow-up of chronic pain syndrome. The pain score at worse is 10/10  and at best is 6-7/10 with medication rest and home remedies.  There is no evidence of any extra dosage or issues of usage outside of the prescribed regimen.  Pain is exacerbated by changes in weather and by strenuous activities. There have been no recent activities to exacerbate the chronic pain. Concomitant medications include gabapentin and Percocet .  Drug screen and medication contract have been renewed within the last 1 year.    Vaginal dryness  Complaint of vaginal dryness and itching and irritation which is improved with intermittent use of Premarin vaginal cream. There's no vaginal bleeding or history of any uterine or vaginal cancer.  Past Medical History  Diagnosis Date  . Diabetes mellitus without complication ([place]) [id number]    non insulin dependent  . Glaucoma 2003  . Hypertension 1980's  . Abnormal mammogram, unspecified 2013  . Shingles 2013  . Osteoporosis   . Breast screening, unspecified 2013  . Lump or mass in breast 01/03/2012    left breast  . Special screening for malignant neoplasms, colon 2013  . Obesity, unspecified 2013  . Hyperlipidemia 2008  . GERD (gastroesophageal reflux disease)     Social History  Substance Use Topics  . Smoking status: Never Smoker   . Smokeless tobacco: Never Used  . Alcohol Use: No     Current outpatient prescriptions:  .  alendronate (FOSAMAX) 70 MG tablet, TAKE 1 TABLET WEEKLY, Disp: 4 tablet, Rfl: 11 .  amLODipine-benazepril (LOTREL) 10-20 MG capsule, Take 1 capsule by mouth daily., Disp: 90 capsule, Rfl: 1 .  bimatoprost (LUMIGAN) 0.01 % SOLN, 1 drop daily., Disp: , Rfl:  .   Cholecalciferol (VITAMIN D-1000 MAX ST) 1000 UNITS tablet, Take by mouth., Disp: , Rfl:  .  fluticasone (FLONASE) 50 MCG/ACT nasal spray, Place into the nose., Disp: , Rfl:  .  gabapentin (NEURONTIN) 300 MG capsule, Take 1 capsule (300 mg total) by mouth 3 (three) times daily., Disp: 90 capsule, Rfl: 1 .  glucose blood test strip, Use as instructed, Disp: 100 each, Rfl: 12 .  Insulin Syringe-Needle U-100 31G X 5/16" 0.5 ML MISC, 100 each by Does not apply route daily., Disp: 100 each, Rfl: 12 .  metFORMIN (GLUCOPHAGE) 500 MG tablet, TAKE 1 TABLET BY MOUTH DAILY, Disp: 90 tablet, Rfl: 1 .  omeprazole (PRILOSEC) 20 MG capsule, Take 1 capsule (20 mg total) by mouth daily., Disp: 90 capsule, Rfl: 1 .  oxyCODONE-acetaminophen (PERCOCET) 7.5-325 MG per tablet, Take 1 tablet by mouth every 8 (eight) hours as needed for severe pain., Disp: 90 tablet, Rfl: 0 .  oxyCODONE-acetaminophen (PERCOCET) 7.5-325 MG per tablet, Take 1 tablet by mouth every 8 (eight) hours as needed for severe pain., Disp: 90 tablet, Rfl: 0 .  oxyCODONE-acetaminophen (PERCOCET) 7.5-325 MG per tablet, Take 1 tablet by mouth every 8 (eight) hours as needed for severe pain., Disp: 90 tablet, Rfl: 0 .  oxyCODONE-acetaminophen (PERCOCET) 7.5-325 MG per tablet, Take 1 tablet by mouth every 8 (eight) hours as needed for severe pain., Disp: 90 tablet, Rfl: 0 .  oxyCODONE-acetaminophen (PERCOCET)  7.5-325 MG tablet, Take 1 tablet by mouth every 8 (eight) hours as needed for severe pain., Disp: 90 tablet, Rfl: 0 .  pravastatin (PRAVACHOL) 40 MG tablet, Take 1 tablet (40 mg total) by mouth daily., Disp: 90 tablet, Rfl: 1 .  quinapril (ACCUPRIL) 40 MG tablet, Take 1 tablet (40 mg total) by mouth daily., Disp: 90 tablet, Rfl: 1 .  terbinafine (LAMISIL) 250 MG tablet, Take 1 tablet (250 mg total) by mouth daily., Disp: 21 tablet, Rfl: 0  No Known Allergies  Review of Systems  Constitutional: Negative for fever, chills and weight loss.  HENT:  Negative for congestion, hearing loss, sore throat and tinnitus.   Eyes: Negative for blurred vision, double vision and redness.  Respiratory: Negative for cough, hemoptysis and shortness of breath.   Cardiovascular: Negative for chest pain, palpitations, orthopnea, claudication and leg swelling.  Gastrointestinal: Negative for heartburn, nausea, vomiting, diarrhea, constipation and blood in stool.  Genitourinary: Negative for dysuria, urgency, frequency and hematuria.       Vaginal irritation and dryness  Musculoskeletal: Positive for back pain and joint pain. Negative for myalgias, falls and neck pain.  Skin: Negative for itching.  Neurological: Negative for dizziness, tingling, tremors, focal weakness, seizures, loss of consciousness, weakness and headaches.  Endo/Heme/Allergies: Does not bruise/bleed easily.  Psychiatric/Behavioral: Negative for depression and substance abuse. The patient is not nervous/anxious and does not have insomnia.      Objective  Filed Vitals:   02/22/15 1100  BP: 130/68  Pulse: 88  Temp: 98 F (36.7 C)  TempSrc: Oral  Resp: 14  Height: 5' (1.524 m)  Weight: 209 lb 14.4 oz (95.21 kg)  SpO2: 98%     Physical Exam  Constitutional: She is oriented to person, place, and time.  Obesity no acute distress  HENT:  Head: Normocephalic.  Eyes: EOM are normal. Pupils are equal, round, and reactive to light.  Neck: Normal range of motion. No thyromegaly present.  Cardiovascular: Normal rate, regular rhythm and normal heart sounds.   No murmur heard. Pulmonary/Chest: Effort normal and breath sounds normal.  Abdominal: Soft. Bowel sounds are normal.  Musculoskeletal: She exhibits tenderness (lower back and both knees). She exhibits no edema.  Neurological: She is alert and oriented to person, place, and time. No cranial nerve deficit. Gait normal.  Skin: Skin is warm and dry. No rash noted.  Psychiatric: Memory and affect normal.      Assessment &  Plan  1. Chronic pain Stable - oxyCODONE-acetaminophen (PERCOCET) 7.5-325 MG tablet; Take 1 tablet by mouth every 8 (eight) hours as needed for severe pain.  Dispense: 90 tablet; Refill: 0 - oxyCODONE-acetaminophen (PERCOCET) 7.5-325 MG tablet; Take 1 tablet by mouth every 8 (eight) hours as needed for severe pain.  Dispense: 90 tablet; Refill: 0 - oxyCODONE-acetaminophen (PERCOCET) 7.5-325 MG tablet; Take 1 tablet by mouth every 8 (eight) hours as needed for severe pain.  Dispense: 90 tablet; Refill: 0  2. Arthritis Worsening  3. Vaginal dryness Renew vaginal cream twice weekly - conjugated estrogens (PREMARIN) vaginal cream; Place 1 Applicatorful vaginally daily.  Dispense: 42.5 g; Refill: 5

## 2015-03-11 ENCOUNTER — Other Ambulatory Visit: Payer: Self-pay | Admitting: Family Medicine

## 2015-03-17 ENCOUNTER — Other Ambulatory Visit: Payer: Self-pay | Admitting: Family Medicine

## 2015-03-25 ENCOUNTER — Ambulatory Visit: Payer: Medicare Other | Admitting: Family Medicine

## 2015-03-31 DIAGNOSIS — H35342 Macular cyst, hole, or pseudohole, left eye: Secondary | ICD-10-CM | POA: Diagnosis not present

## 2015-03-31 DIAGNOSIS — H2513 Age-related nuclear cataract, bilateral: Secondary | ICD-10-CM | POA: Diagnosis not present

## 2015-03-31 DIAGNOSIS — H401132 Primary open-angle glaucoma, bilateral, moderate stage: Secondary | ICD-10-CM | POA: Diagnosis not present

## 2015-04-13 DIAGNOSIS — B351 Tinea unguium: Secondary | ICD-10-CM | POA: Diagnosis not present

## 2015-04-13 DIAGNOSIS — M79675 Pain in left toe(s): Secondary | ICD-10-CM | POA: Diagnosis not present

## 2015-04-13 DIAGNOSIS — M2011 Hallux valgus (acquired), right foot: Secondary | ICD-10-CM | POA: Diagnosis not present

## 2015-04-13 DIAGNOSIS — M79674 Pain in right toe(s): Secondary | ICD-10-CM | POA: Diagnosis not present

## 2015-04-13 DIAGNOSIS — E119 Type 2 diabetes mellitus without complications: Secondary | ICD-10-CM | POA: Diagnosis not present

## 2015-04-20 ENCOUNTER — Ambulatory Visit: Payer: Medicare Other | Admitting: Family Medicine

## 2015-05-03 ENCOUNTER — Other Ambulatory Visit: Payer: Self-pay

## 2015-05-03 DIAGNOSIS — G8929 Other chronic pain: Secondary | ICD-10-CM

## 2015-05-03 MED ORDER — OXYCODONE-ACETAMINOPHEN 7.5-325 MG PO TABS: 1.0000 | ORAL_TABLET | Freq: Three times a day (TID) | ORAL | Status: DC | PRN

## 2015-05-16 DIAGNOSIS — H2511 Age-related nuclear cataract, right eye: Secondary | ICD-10-CM | POA: Diagnosis not present

## 2015-05-16 DIAGNOSIS — H35342 Macular cyst, hole, or pseudohole, left eye: Secondary | ICD-10-CM | POA: Diagnosis not present

## 2015-05-16 DIAGNOSIS — H2513 Age-related nuclear cataract, bilateral: Secondary | ICD-10-CM | POA: Diagnosis not present

## 2015-05-16 DIAGNOSIS — H18413 Arcus senilis, bilateral: Secondary | ICD-10-CM | POA: Diagnosis not present

## 2015-05-16 DIAGNOSIS — H02839 Dermatochalasis of unspecified eye, unspecified eyelid: Secondary | ICD-10-CM | POA: Diagnosis not present

## 2015-05-20 ENCOUNTER — Encounter (INDEPENDENT_AMBULATORY_CARE_PROVIDER_SITE_OTHER): Payer: Medicare Other | Admitting: Ophthalmology

## 2015-05-20 DIAGNOSIS — H35342 Macular cyst, hole, or pseudohole, left eye: Secondary | ICD-10-CM

## 2015-05-20 DIAGNOSIS — I10 Essential (primary) hypertension: Secondary | ICD-10-CM

## 2015-05-20 DIAGNOSIS — H35032 Hypertensive retinopathy, left eye: Secondary | ICD-10-CM

## 2015-05-20 DIAGNOSIS — H2513 Age-related nuclear cataract, bilateral: Secondary | ICD-10-CM

## 2015-05-20 DIAGNOSIS — H43813 Vitreous degeneration, bilateral: Secondary | ICD-10-CM | POA: Diagnosis not present

## 2015-05-23 ENCOUNTER — Ambulatory Visit: Payer: Medicare Other | Admitting: Family Medicine

## 2015-06-02 ENCOUNTER — Other Ambulatory Visit: Payer: Self-pay | Admitting: Family Medicine

## 2015-06-02 DIAGNOSIS — I10 Essential (primary) hypertension: Secondary | ICD-10-CM

## 2015-06-02 MED ORDER — AMLODIPINE BESY-BENAZEPRIL HCL 10-20 MG PO CAPS: 1.0000 | ORAL_CAPSULE | Freq: Every day | ORAL | Status: DC

## 2015-06-02 MED ORDER — OMEPRAZOLE 20 MG PO CPDR: 20.0000 mg | DELAYED_RELEASE_CAPSULE | Freq: Every day | ORAL | Status: DC

## 2015-06-02 MED ORDER — METFORMIN HCL 500 MG PO TABS: 500.0000 mg | ORAL_TABLET | Freq: Every day | ORAL | Status: DC

## 2015-06-07 DIAGNOSIS — H2513 Age-related nuclear cataract, bilateral: Secondary | ICD-10-CM | POA: Diagnosis not present

## 2015-06-07 DIAGNOSIS — H2511 Age-related nuclear cataract, right eye: Secondary | ICD-10-CM | POA: Diagnosis not present

## 2015-06-08 ENCOUNTER — Other Ambulatory Visit: Payer: Self-pay

## 2015-06-08 DIAGNOSIS — H2512 Age-related nuclear cataract, left eye: Secondary | ICD-10-CM | POA: Diagnosis not present

## 2015-06-08 DIAGNOSIS — Z0283 Encounter for blood-alcohol and blood-drug test: Secondary | ICD-10-CM

## 2015-06-08 DIAGNOSIS — Z0189 Encounter for other specified special examinations: Secondary | ICD-10-CM | POA: Diagnosis not present

## 2015-06-08 DIAGNOSIS — F1110 Opioid abuse, uncomplicated: Secondary | ICD-10-CM

## 2015-06-13 ENCOUNTER — Encounter: Payer: Self-pay | Admitting: Family Medicine

## 2015-06-16 ENCOUNTER — Other Ambulatory Visit: Payer: Self-pay | Admitting: Family Medicine

## 2015-06-16 ENCOUNTER — Other Ambulatory Visit: Payer: Self-pay | Admitting: Orthopaedic Surgery

## 2015-06-16 DIAGNOSIS — Z1231 Encounter for screening mammogram for malignant neoplasm of breast: Secondary | ICD-10-CM

## 2015-06-16 DIAGNOSIS — Z96653 Presence of artificial knee joint, bilateral: Secondary | ICD-10-CM

## 2015-06-17 ENCOUNTER — Ambulatory Visit
Admission: RE | Admit: 2015-06-17 | Discharge: 2015-06-17 | Disposition: A | Discharge status: Home / Self Care | Payer: Medicare Other | Source: Ambulatory Visit | Attending: Orthopaedic Surgery | Admitting: Orthopaedic Surgery

## 2015-06-17 DIAGNOSIS — Z96653 Presence of artificial knee joint, bilateral: Secondary | ICD-10-CM | POA: Diagnosis not present

## 2015-06-17 DIAGNOSIS — Z471 Aftercare following joint replacement surgery: Secondary | ICD-10-CM | POA: Diagnosis not present

## 2015-06-17 DIAGNOSIS — Z96651 Presence of right artificial knee joint: Secondary | ICD-10-CM | POA: Diagnosis not present

## 2015-06-17 DIAGNOSIS — Z96652 Presence of left artificial knee joint: Secondary | ICD-10-CM | POA: Diagnosis not present

## 2015-06-21 DIAGNOSIS — H2512 Age-related nuclear cataract, left eye: Secondary | ICD-10-CM | POA: Diagnosis not present

## 2015-06-21 DIAGNOSIS — H2513 Age-related nuclear cataract, bilateral: Secondary | ICD-10-CM | POA: Diagnosis not present

## 2015-06-27 DIAGNOSIS — Z96653 Presence of artificial knee joint, bilateral: Secondary | ICD-10-CM | POA: Diagnosis not present

## 2015-06-27 DIAGNOSIS — R208 Other disturbances of skin sensation: Secondary | ICD-10-CM | POA: Diagnosis not present

## 2015-07-04 ENCOUNTER — Other Ambulatory Visit: Payer: Self-pay

## 2015-07-04 DIAGNOSIS — I10 Essential (primary) hypertension: Secondary | ICD-10-CM

## 2015-07-04 MED ORDER — AMLODIPINE BESYLATE 10 MG PO TABS: 10.0000 mg | ORAL_TABLET | Freq: Every day | ORAL | Status: DC

## 2015-07-11 ENCOUNTER — Other Ambulatory Visit: Payer: Self-pay | Admitting: Family Medicine

## 2015-07-11 ENCOUNTER — Ambulatory Visit
Admission: RE | Admit: 2015-07-11 | Discharge: 2015-07-11 | Disposition: A | Discharge status: Home / Self Care | Payer: Medicare Other | Source: Ambulatory Visit | Attending: Family Medicine | Admitting: Family Medicine

## 2015-07-11 DIAGNOSIS — Z1231 Encounter for screening mammogram for malignant neoplasm of breast: Secondary | ICD-10-CM

## 2015-07-14 DIAGNOSIS — T1511XA Foreign body in conjunctival sac, right eye, initial encounter: Secondary | ICD-10-CM | POA: Diagnosis not present

## 2015-07-15 ENCOUNTER — Other Ambulatory Visit: Payer: Self-pay | Admitting: Family Medicine

## 2015-07-15 NOTE — Telephone Encounter (Signed)
Patient requesting refill. 

## 2015-07-28 ENCOUNTER — Other Ambulatory Visit: Payer: Self-pay | Admitting: Family Medicine

## 2015-08-05 ENCOUNTER — Ambulatory Visit (INDEPENDENT_AMBULATORY_CARE_PROVIDER_SITE_OTHER): Payer: Medicare Other | Admitting: Family Medicine

## 2015-08-05 ENCOUNTER — Encounter: Payer: Self-pay | Admitting: Family Medicine

## 2015-08-05 VITALS — BP 128/76 | HR 72 | Temp 98.1°F | Resp 16 | Ht 60.0 in | Wt 211.1 lb

## 2015-08-05 DIAGNOSIS — Z9889 Other specified postprocedural states: Secondary | ICD-10-CM

## 2015-08-05 DIAGNOSIS — I10 Essential (primary) hypertension: Secondary | ICD-10-CM | POA: Diagnosis not present

## 2015-08-05 DIAGNOSIS — E785 Hyperlipidemia, unspecified: Secondary | ICD-10-CM | POA: Diagnosis not present

## 2015-08-05 DIAGNOSIS — E1129 Type 2 diabetes mellitus with other diabetic kidney complication: Secondary | ICD-10-CM | POA: Diagnosis not present

## 2015-08-05 DIAGNOSIS — M5441 Lumbago with sciatica, right side: Secondary | ICD-10-CM | POA: Diagnosis not present

## 2015-08-05 DIAGNOSIS — Z23 Encounter for immunization: Secondary | ICD-10-CM | POA: Diagnosis not present

## 2015-08-05 DIAGNOSIS — M150 Primary generalized (osteo)arthritis: Secondary | ICD-10-CM | POA: Diagnosis not present

## 2015-08-05 DIAGNOSIS — M159 Polyosteoarthritis, unspecified: Secondary | ICD-10-CM

## 2015-08-05 DIAGNOSIS — K219 Gastro-esophageal reflux disease without esophagitis: Secondary | ICD-10-CM | POA: Diagnosis not present

## 2015-08-05 DIAGNOSIS — G8929 Other chronic pain: Secondary | ICD-10-CM

## 2015-08-05 DIAGNOSIS — Z79899 Other long term (current) drug therapy: Secondary | ICD-10-CM | POA: Diagnosis not present

## 2015-08-05 DIAGNOSIS — R809 Proteinuria, unspecified: Secondary | ICD-10-CM

## 2015-08-05 DIAGNOSIS — M8949 Other hypertrophic osteoarthropathy, multiple sites: Secondary | ICD-10-CM

## 2015-08-05 MED ORDER — OXYCODONE-ACETAMINOPHEN 7.5-325 MG PO TABS: 1.0000 | ORAL_TABLET | Freq: Three times a day (TID) | ORAL | Status: DC | PRN

## 2015-08-05 MED ORDER — QUINAPRIL HCL 40 MG PO TABS: 40.0000 mg | ORAL_TABLET | Freq: Every day | ORAL | Status: DC

## 2015-08-05 MED ORDER — PRAVASTATIN SODIUM 40 MG PO TABS
40.0000 mg | ORAL_TABLET | Freq: Every day | ORAL | Status: DC
Start: 2015-08-05 — End: 2015-11-24

## 2015-08-05 MED ORDER — OMEPRAZOLE 20 MG PO CPDR: 20.0000 mg | DELAYED_RELEASE_CAPSULE | Freq: Every day | ORAL | Status: DC

## 2015-08-05 MED ORDER — AMLODIPINE BESYLATE 10 MG PO TABS: 10.0000 mg | ORAL_TABLET | Freq: Every day | ORAL | Status: DC

## 2015-08-05 MED ORDER — ASPIRIN EC 81 MG PO TBEC: 81.0000 mg | DELAYED_RELEASE_TABLET | Freq: Every day | ORAL | Status: DC

## 2015-08-05 NOTE — Progress Notes (Signed)
Name: Taylor [name]   MRN: [id number]    DOB: 10/24/1932   Date:08/05/2015       Progress Note  Subjective  Chief Complaint  Chief Complaint  Patient presents with  . Medication Refill    BP and pain meds, diabetic supplies.  . Diabetes    patient stated that her blood sugar runs around 112-140. needs diabetic foot exam. patient has an appt with Dr. Silvano Bilis [facility]) on 08/10/15 at 11:15am.  . Pain    patient stated that she has intermittent pain in multiple sites. she does have arthritis.  . Labs Only    last labs was 03/16/2014  . Immunizations    PCV 23 patient does not like needles, but Bonnita Nasuti usually gives them to her with no problem.  . Hypertension    patient has not been     HPI  Chronic pain: she has a history of lumbar spine surgery and OA of multiple joints, she states she has been on Percocet for a long time. Discussed risk of of pain medication in her age group including chance of fall and accidental overdose with possible death, but she refuses to try to wean off medication, advised referral to pain clinic. I will give her one month supply only. She uses a cane to help with gait, pain level at this time is 7/10. Denies sedation or constipation with narcotic use  HTN: taking medication as prescribed, no chest pain or palpitation  DMII with microalbuminuria: she has been taking medication as prescribed, glucose at home has been 112-140 range. She denies polydipsia, polyuria or polyphagia. She is having cataract surgery and had recent eye exam. She is taking Ace as prescribed. No side effects of Metformin  GERD: discussed possible side effects of long term use of PPI, discussed trying to alternate with Zantac and eventually wean self off it. No heartburn or regurgitation at this time  Rash on both hips: she wears depends at night, and has hyperpigmentation and per patient itching daily, advised A&D ointment, hydrocortisone or try different  Depends  Patient Active Problem List   Diagnosis Date Noted  . Hyperlipidemia 08/05/2015  . Vaginal dryness 02/22/2015  . Calculus of kidney 11/10/2014  . Osteoarthritis, multiple sites 09/09/2014  . Diabetes mellitus, type 2 ([place]) 09/09/2014  . Glaucoma 09/09/2014  . BP (high blood pressure) 09/09/2014  . Chronic pain 09/09/2014  . Tinea corporis 09/09/2014  . Umbilical hernia [id number]  . Lump or mass in breast     Past Surgical History  Procedure Laterality Date  . Abdominal hysterectomy  1958    menorrhagia  . Knee surgery  2009,2011,2012    twice on right and once on left  . Lipoma excision  1998  . Spine surgery  2004  . Tonsillectomy      age of 57  . Colonoscopy  2003    [facility]  . Coronary artery bypass graft Left 01/03/2012,01/24/2012    Left FNA, Left breast [facility] bx  . Breast biopsy Left 2013    stereo, negativie  . Cataract extraction, bilateral Bilateral     1st 06/07/15 and the 2nd 06/21/15    Family History  Problem Relation Age of Onset  . Cancer Mother     skin cancer    Social History   Social History  . Marital Status: Divorced    Spouse Name: N/A  . Number of Children: N/A  . Years of Education: N/A   Occupational History  .  Not on file.   Social History Main Topics  . Smoking status: Never Smoker   . Smokeless tobacco: Never Used  . Alcohol Use: No  . Drug Use: No  . Sexual Activity: No   Other Topics Concern  . Not on file   Social History Narrative     Current outpatient prescriptions:  .  alendronate (FOSAMAX) 70 MG tablet, TAKE 1 TABLET WEEKLY, Disp: 4 tablet, Rfl: 11 .  amLODipine (NORVASC) 10 MG tablet, Take 1 tablet (10 mg total) by mouth daily., Disp: 90 tablet, Rfl: 1 .  BESIVANCE 0.6 % SUSP, USE 1 DROP IN LEFT EYE THREE TIMES A DAY, Disp: , Rfl: 6 .  bimatoprost (LUMIGAN) 0.01 % SOLN, 1 drop daily., Disp: , Rfl:  .  DUREZOL 0.05 % EMUL, , Disp: , Rfl:  .  fluticasone (FLONASE) 50 MCG/ACT nasal spray, Place into the  nose., Disp: , Rfl:  .  glucose blood test strip, Use as instructed, Disp: 100 each, Rfl: 12 .  metFORMIN (GLUCOPHAGE) 500 MG tablet, Take 1 tablet (500 mg total) by mouth daily., Disp: 90 tablet, Rfl: 0 .  omeprazole (PRILOSEC) 20 MG capsule, Take 1 capsule (20 mg total) by mouth daily., Disp: 90 capsule, Rfl: 1 .  ONETOUCH DELICA LANCETS FINE MISC, USE ONCE DAILY TO CHECK BLOOD SUGAR, Disp: 100 each, Rfl: 0 .  pravastatin (PRAVACHOL) 40 MG tablet, Take 1 tablet (40 mg total) by mouth at bedtime., Disp: 90 tablet, Rfl: 0 .  PROLENSA 0.07 % SOLN, USE 1 DROP IN LEFT EYE AT BEDTIME, Disp: , Rfl: 1 .  quinapril (ACCUPRIL) 40 MG tablet, Take 1 tablet (40 mg total) by mouth daily., Disp: 90 tablet, Rfl: 1 .  aspirin EC 81 MG tablet, Take 1 tablet (81 mg total) by mouth daily., Disp: 30 tablet, Rfl: 0 .  Cholecalciferol (VITAMIN D-1000 MAX ST) 1000 UNITS tablet, Take by mouth. Reported on 08/05/2015, Disp: , Rfl:  .  conjugated estrogens (PREMARIN) vaginal cream, Place 1 Applicatorful vaginally daily., Disp: 42.5 g, Rfl: 5 .  oxyCODONE-acetaminophen (PERCOCET) 7.5-325 MG tablet, Take 1 tablet by mouth every 8 (eight) hours as needed for severe pain., Disp: 90 tablet, Rfl: 0  No Known Allergies   ROS  Constitutional: Negative for fever or weight change.  Respiratory: Negative for cough and shortness of breath.   Cardiovascular: Negative for chest pain or palpitations.  Gastrointestinal: Negative for abdominal pain, no bowel changes.  Musculoskeletal: Negative for gait problem or joint swelling.  Skin: Negative for rash.  Neurological: Negative for dizziness or headache.  No other specific complaints in a complete review of systems (except as listed in HPI above).  Objective  Filed Vitals:   08/05/15 1149  BP: 128/76  Pulse: 72  Temp: 98.1 F (36.7 C)  TempSrc: Oral  Resp: 16  Height: 5' (1.524 m)  Weight: 211 lb 1.6 oz (95.754 kg)  SpO2: 95%    Body mass index is 41.23  kg/(m^2).  Physical Exam  Constitutional: Patient appears well-developed and well-nourished. Obese  No distress.  HEENT: head atraumatic, normocephalic,neck supple, throat within normal limits Cardiovascular: Normal rate, regular rhythm, systolic ejection murmur 2/6 ( seen by Dr. Clayborn Bigness in the past )  Trace BLE edema. Pulmonary/Chest: Effort normal and breath sounds normal. No respiratory distress. Abdominal: Soft.  There is no tenderness. Psychiatric: Patient has a normal mood and affect. behavior is normal. Judgment and thought content normal. Muscular Skeletal : history of knee replacement, no crepitus,  pain during palpation of lumbar spine, negative for radiculitis.    PHQ2/9: Depression screen [facility] 2/9 08/05/2015 02/22/2015 11/10/2014  Decreased Interest 0 0 0  Down, Depressed, Hopeless 0 0 0  PHQ - 2 Score 0 0 0     Fall Risk: Fall Risk  08/05/2015 02/22/2015 11/10/2014 09/09/2014  Falls in the past year? Yes No No No  Number falls in past yr: 1 - - -  Injury with Fall? No - - -     Functional Status Survey: Is the patient deaf or have difficulty hearing?: Yes Does the patient have difficulty seeing, even when wearing glasses/contacts?: Yes (patient recently had cataract surgery) Does the patient have difficulty concentrating, remembering, or making decisions?: No Does the patient have difficulty walking or climbing stairs?: Yes Does the patient have difficulty dressing or bathing?: No Does the patient have difficulty doing errands alone such as visiting a doctor's office or shopping?: Yes    Assessment & Plan  1. Essential hypertension  At goal - quinapril (ACCUPRIL) 40 MG tablet; Take 1 tablet (40 mg total) by mouth daily.  Dispense: 90 tablet; Refill: 1 - amLODipine (NORVASC) 10 MG tablet; Take 1 tablet (10 mg total) by mouth daily.  Dispense: 90 tablet; Refill: 1 - Comprehensive metabolic panel  2. Chronic pain  Explained to the patient that I don't feel  comfortable rx her narcotics, she agrees in going back to pain clinic - oxyCODONE-acetaminophen (PERCOCET) 7.5-325 MG tablet; Take 1 tablet by mouth every 8 (eight) hours as needed for severe pain.  Dispense: 90 tablet; Refill: 0 - Ambulatory referral to Pain Clinic  3. Primary osteoarthritis involving multiple joints  - Ambulatory referral to Pain Clinic  4. History of back surgery   5. Hyperlipidemia  - pravastatin (PRAVACHOL) 40 MG tablet; Take 1 tablet (40 mg total) by mouth at bedtime.  Dispense: 90 tablet; Refill: 0 - Lipid panel - Comprehensive metabolic panel  6. Gastroesophageal reflux disease without esophagitis  She will try to wean self off, will alternate with Ranitidine - omeprazole (PRILOSEC) 20 MG capsule; Take 1 capsule (20 mg total) by mouth daily.  Dispense: 90 capsule; Refill: 1  7. 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 Controlled type 2 diabetes mellitus with microalbuminuria, without long-term current use of insulin ([facility])  - POCT HgB at goal for her - POCT UA - Microalbumin -50  9. Need for pneumococcal vaccination  - Pneumococcal conjugate vaccine 13-valent IM  10. Encounter for long-term (current) use of high-risk medication  - Comprehensive metabolic panel

## 2015-08-09 DIAGNOSIS — I10 Essential (primary) hypertension: Secondary | ICD-10-CM | POA: Diagnosis not present

## 2015-08-09 DIAGNOSIS — Z79899 Other long term (current) drug therapy: Secondary | ICD-10-CM | POA: Diagnosis not present

## 2015-08-09 DIAGNOSIS — E785 Hyperlipidemia, unspecified: Secondary | ICD-10-CM | POA: Diagnosis not present

## 2015-08-10 LAB — COMPREHENSIVE METABOLIC PANEL
ALT: 6 IU/L (ref 0–32)
AST: 9 IU/L (ref 0–40)
Albumin/Globulin Ratio: 1.7 (ref 1.2–2.2)
Albumin: 4.1 g/dL (ref 3.5–4.7)
Alkaline Phosphatase: 56 IU/L (ref 39–117)
BUN/Creatinine Ratio: 16 (ref 12–28)
BUN: 22 mg/dL (ref 8–27)
Bilirubin Total: 0.3 mg/dL (ref 0.0–1.2)
CO2: 22 mmol/L (ref 18–29)
Calcium: 9.3 mg/dL (ref 8.7–10.3)
Chloride: 105 mmol/L (ref 96–106)
Creatinine, Ser: 1.38 mg/dL — ABNORMAL HIGH (ref 0.57–1.00)
GFR calc Af Amer: 42 mL/min/{1.73_m2} — ABNORMAL LOW (ref >59)
GFR calc non Af Amer: 36 mL/min/{1.73_m2} — ABNORMAL LOW (ref >59)
Globulin, Total: 2.4 g/dL (ref 1.5–4.5)
Glucose: 160 mg/dL — ABNORMAL HIGH (ref 65–99)
Potassium: 5.2 mmol/L (ref 3.5–5.2)
Sodium: 141 mmol/L (ref 134–144)
Total Protein: 6.5 g/dL (ref 6.0–8.5)

## 2015-08-10 LAB — LIPID PANEL
Chol/HDL Ratio: 2.7 ratio units (ref 0.0–4.4)
Cholesterol, Total: 127 mg/dL (ref 100–199)
HDL: 47 mg/dL (ref >39)
LDL Calculated: 59 mg/dL (ref 0–99)
Triglycerides: 105 mg/dL (ref 0–149)
VLDL Cholesterol Cal: 21 mg/dL (ref 5–40)

## 2015-08-11 ENCOUNTER — Emergency Department: Payer: Medicare Other

## 2015-08-11 ENCOUNTER — Encounter: Payer: Self-pay | Admitting: Emergency Medicine

## 2015-08-11 ENCOUNTER — Emergency Department
Admission: EM | Admit: 2015-08-11 | Discharge: 2015-08-12 | Disposition: A | Discharge status: Home / Self Care | Payer: Medicare Other | Attending: Emergency Medicine | Admitting: Emergency Medicine

## 2015-08-11 ENCOUNTER — Other Ambulatory Visit: Payer: Self-pay

## 2015-08-11 DIAGNOSIS — E119 Type 2 diabetes mellitus without complications: Secondary | ICD-10-CM | POA: Insufficient documentation

## 2015-08-11 DIAGNOSIS — Z951 Presence of aortocoronary bypass graft: Secondary | ICD-10-CM | POA: Insufficient documentation

## 2015-08-11 DIAGNOSIS — Z79899 Other long term (current) drug therapy: Secondary | ICD-10-CM | POA: Insufficient documentation

## 2015-08-11 DIAGNOSIS — M810 Age-related osteoporosis without current pathological fracture: Secondary | ICD-10-CM | POA: Insufficient documentation

## 2015-08-11 DIAGNOSIS — Z7951 Long term (current) use of inhaled steroids: Secondary | ICD-10-CM | POA: Insufficient documentation

## 2015-08-11 DIAGNOSIS — M79601 Pain in right arm: Secondary | ICD-10-CM | POA: Diagnosis not present

## 2015-08-11 DIAGNOSIS — M1990 Unspecified osteoarthritis, unspecified site: Secondary | ICD-10-CM | POA: Insufficient documentation

## 2015-08-11 DIAGNOSIS — I10 Essential (primary) hypertension: Secondary | ICD-10-CM | POA: Insufficient documentation

## 2015-08-11 DIAGNOSIS — Z7984 Long term (current) use of oral hypoglycemic drugs: Secondary | ICD-10-CM | POA: Diagnosis not present

## 2015-08-11 DIAGNOSIS — E785 Hyperlipidemia, unspecified: Secondary | ICD-10-CM | POA: Diagnosis not present

## 2015-08-11 DIAGNOSIS — Z7982 Long term (current) use of aspirin: Secondary | ICD-10-CM | POA: Insufficient documentation

## 2015-08-11 DIAGNOSIS — I517 Cardiomegaly: Secondary | ICD-10-CM | POA: Diagnosis not present

## 2015-08-11 HISTORY — DX: Gout, unspecified: M10.9

## 2015-08-11 NOTE — ED Provider Notes (Signed)
[facility] Emergency Department Provider Note  ____________________________________________  Time seen: Approximately 10:32 PM  I have reviewed the triage vital signs and the nursing notes.   HISTORY  Chief Complaint Back Pain and Shoulder Pain    HPI Taylor [name] is a 82 y.o. female with history of chronic pain primarily in her lumbar spine and a history of prior lumbar surgery who has been on Percocet for decades presents for evaluation of right arm pain.  She reports that she noticed it when she woke up yesterday morning and that it is been present and constant and severe for all day yesterday and all day today until she decided to come to the emergency department.  She describes it as an aching pain that radiates down from her shoulder down to her forearm.  She denies fever/chills, chest pain, shortness of breath, nausea, vomiting, diarrhea, abdominal pain, dysuria.  Moving the arm causes the pain to get worse and holding it at rest makes it better.  She states that she has tried taking some of her usual Percocet which does seem to make the pain slightly better.  She denies any recent traumatic injuries and does not think that she strained it.  She also denies headache and neck pain.  She has had no numbness nor tingling in the extremity.   Past Medical History  Diagnosis Date  . Diabetes mellitus without complication ([place]) [id number]    non insulin dependent  . Glaucoma 2003  . Hypertension 1980's  . Abnormal mammogram, unspecified 2013  . Shingles 2013  . Osteoporosis   . Breast screening, unspecified 2013  . Lump or mass in breast 01/03/2012    left breast  . Special screening for malignant neoplasms, colon 2013  . Obesity, unspecified 2013  . Hyperlipidemia 2008  . GERD (gastroesophageal reflux disease)   . Gout     Patient Active Problem List   Diagnosis Date Noted  . Hyperlipidemia 08/05/2015  . Vaginal dryness 02/22/2015  . Calculus of  kidney 11/10/2014  . Osteoarthritis, multiple sites 09/09/2014  . Diabetes mellitus, type 2 ([place]) 09/09/2014  . Glaucoma 09/09/2014  . BP (high blood pressure) 09/09/2014  . Chronic pain 09/09/2014  . Tinea corporis 09/09/2014  . Umbilical hernia [id number]  . Lump or mass in breast     Past Surgical History  Procedure Laterality Date  . Abdominal hysterectomy  1958    menorrhagia  . Knee surgery  2009,2011,2012    twice on right and once on left  . Lipoma excision  1998  . Spine surgery  2004  . Tonsillectomy      age of 30  . Colonoscopy  2003    [facility]  . Coronary artery bypass graft Left 01/03/2012,01/24/2012    Left FNA, Left breast [facility] bx  . Breast biopsy Left 2013    stereo, negativie  . Cataract extraction, bilateral Bilateral     1st 06/07/15 and the 2nd 06/21/15  . Cholecystectomy    . Back surgery      Current Outpatient Rx  Name  Route  Sig  Dispense  Refill  . alendronate (FOSAMAX) 70 MG tablet      TAKE 1 TABLET WEEKLY   4 tablet   11   . amLODipine (NORVASC) 10 MG tablet   Oral   Take 1 tablet (10 mg total) by mouth daily.   90 tablet   1   . aspirin EC 81 MG tablet   Oral  Take 1 tablet (81 mg total) by mouth daily.   30 tablet   0   . bimatoprost (LUMIGAN) 0.01 % SOLN   Both Eyes   Place 1 drop into both eyes daily.          . brimonidine-timolol (COMBIGAN) 0.2-0.5 % ophthalmic solution   Both Eyes   Place 1 drop into both eyes See admin instructions. 1 drop into both eyes in the morning, then 1 drop into both eyes 8 hours later         . Cholecalciferol (VITAMIN D-1000 MAX ST) 1000 UNITS tablet   Oral   Take 1,000 Units by mouth daily. Reported on 08/05/2015         . conjugated estrogens (PREMARIN) vaginal cream   Vaginal   Place 1 Applicatorful vaginally daily.   42.5 g   5   . DUREZOL 0.05 % EMUL   Both Eyes   Place 1 drop into both eyes 4 (four) times daily.            Dispense as written.   . fluticasone  (FLONASE) 50 MCG/ACT nasal spray   Each Nare   Place 2 sprays into both nostrils daily.          [name] glucose blood test strip      Use as instructed   100 each   12   . metFORMIN (GLUCOPHAGE) 500 MG tablet   Oral   Take 1 tablet (500 mg total) by mouth daily.   90 tablet   0   . omeprazole (PRILOSEC) 20 MG capsule   Oral   Take 1 capsule (20 mg total) by mouth daily.   90 capsule   1   . ONETOUCH DELICA LANCETS FINE MISC      USE ONCE DAILY TO CHECK BLOOD SUGAR   100 each   0   . oxyCODONE-acetaminophen (PERCOCET) 7.5-325 MG tablet   Oral   Take 1 tablet by mouth every 8 (eight) hours as needed for severe pain.   90 tablet   0     Refill 06/22/2015   . pravastatin (PRAVACHOL) 40 MG tablet   Oral   Take 1 tablet (40 mg total) by mouth at bedtime.   90 tablet   0   . quinapril (ACCUPRIL) 40 MG tablet   Oral   Take 1 tablet (40 mg total) by mouth daily.   90 tablet   1   . BESIVANCE 0.6 % SUSP      Reported on 08/11/2015      6     Dispense as written.   [name] PROLENSA 0.07 % SOLN      Reported on 08/11/2015      1     Dispense as written.     Allergies Review of patient's allergies indicates no known allergies.  Family History  Problem Relation Age of Onset  . Cancer Mother     skin cancer    Social History Social History  Substance Use Topics  . Smoking status: Never Smoker   . Smokeless tobacco: Never Used  . Alcohol Use: No    Review of Systems Constitutional: No fever/chills Eyes: No visual changes. ENT: No sore throat. Cardiovascular: Denies chest pain. Respiratory: Denies shortness of breath. Gastrointestinal: No abdominal pain.  No nausea, no vomiting.  No diarrhea.  No constipation. Genitourinary: Negative for dysuria. Musculoskeletal: Pain in her right upper extremity from the shoulder down to the forearm. Skin: Negative for rash.  Neurological: Negative for headaches, focal weakness or numbness.  10-point ROS otherwise  negative.  ____________________________________________   PHYSICAL EXAM:  VITAL SIGNS: ED Triage Vitals  Enc Vitals Group     BP 08/11/15 2140 196/78 mmHg     Pulse Rate 08/11/15 2140 70     Resp 08/11/15 2140 22     Temp 08/11/15 2140 98.2 F (36.8 C)     Temp Source 08/11/15 2140 Oral     SpO2 08/11/15 2140 98 %     Weight 08/11/15 2140 211 lb (95.709 kg)     Height 08/11/15 2140 5\' 2"  (1.575 m)     Head Cir --      Peak Flow --      Pain Score 08/11/15 2141 10     Pain Loc --      Pain Edu? --      Excl. in [place]? --     Constitutional: Alert and oriented. Well appearing and in no acute distress. Eyes: Conjunctivae are normal. PERRL. EOMI. Head: Atraumatic. Nose: No congestion/rhinnorhea. Mouth/Throat: Mucous membranes are moist.  Oropharynx non-erythematous. Neck: No stridor.  No meningeal signs.  No cervical spine tenderness to palpation. Cardiovascular: Normal rate, regular rhythm. Good peripheral circulation. Grossly normal heart sounds.   Respiratory: Normal respiratory effort.  No retractions. Lungs CTAB. Gastrointestinal: Soft and nontender. No distention.  Musculoskeletal: Highly reproducible right shoulder pain with range of motion (both active and passive).  Neurovascularly intact throughout the upper extremities.  No evidence of infection/cellulitis.  No gross deformity.  Radial pulses are intact bilaterally.  No tenderness to palpation throughout the spine.  The patient has old surgical scars down her spine and a horizontal scar on her left shoulder where she says a lipoma was excised. Neurologic:  Normal speech and language. No gross focal neurologic deficits are appreciated.  Skin:  Skin is warm, dry and intact. No rash noted. Psychiatric: Mood and affect are normal. Speech and behavior are normal.  ____________________________________________   LABS (all labs ordered are listed, but only abnormal results are displayed)  Labs Reviewed - No data to  display ____________________________________________  EKG  ED ECG REPORT I, Keaundre Thelin, the attending physician, personally viewed and interpreted this ECG.  Date: 08/11/2015 EKG Time: 21:40 Rate: 77 Rhythm: normal sinus rhythm QRS Axis: normal Intervals: normal ST/T Wave abnormalities: normal Conduction Disturbances: none Narrative Interpretation: unremarkable  ____________________________________________  RADIOLOGY   Dg Chest 2 View  08/11/2015  CLINICAL DATA:  Acute onset of right-sided arm pain, radiating to the right shoulder. Initial encounter. EXAM: CHEST  2 VIEW COMPARISON:  Chest radiograph performed 06/11/2012 FINDINGS: The lungs are well-aerated and clear. There is no evidence of focal opacification, pleural effusion or pneumothorax. The heart is borderline enlarged. No acute osseous abnormalities are seen. Clips are noted within the right upper quadrant, reflecting prior cholecystectomy. IMPRESSION: Borderline cardiomegaly, though the superior mediastinum is normal in appearance. The thoracic aorta is grossly unremarkable in appearance. No acute cardiopulmonary process seen. Electronically Signed   By: Garald Balding M.D.   On: 08/11/2015 23:19    ____________________________________________   PROCEDURES  Procedure(s) performed:   Procedures   ____________________________________________   INITIAL IMPRESSION / ASSESSMENT AND PLAN / ED COURSE  Pertinent labs & imaging results that were available during my care of the patient were reviewed by me and considered in my medical decision making (see chart for details).  The patient is joking with me and laughing and in no acute distress.  I discussed with her how the signs and symptoms seem to be musculoskeletal strain.  I reviewed her medical history and she was at her primary care doctor about 6 days ago.  Her doctor is referring her to a pain management clinic because the PCP is no longer comfortable  prescribing chronic narcotics at her age.  The patient refuses to try and wean herself off.  I mentioned that she should continue taking her chronic narcotics because the PCP said that she would prescribe one month supply for the last time.  The patient told me she had only one pill left, but I reminded her that she was given a prescription about 6 days ago for a full month's supply, and she said "oh, that medicine.".  I have a very low suspicion that she has an acute or emergent medical condition.  I am obtaining a two-view chest x-ray to rule out a widened mediastinum and to look for any gross abnormality in the thorax, as well as x-rays of her right shoulder, but anticipate close outpatient follow-up with her doctor will be the most appropriate plan.  I explained this to her and her caretaker who is present in the room and they both understand and agree.  ----------------------------------------- 11:31 PM on 08/11/2015 -----------------------------------------  The patient felt that it was too painful to raise her arm over her head to obtain the shoulder x-rays.  However in the room she continues to have normal range of motion.  I discussed again with her the chronic osteoarthritis and the very musculoskeletal nature of her symptoms.  She is okay with the plan to take her outpatient Percocet to follow up with an orthopedic doctor or her regular doctor.  ____________________________________________  FINAL CLINICAL IMPRESSION(S) / ED DIAGNOSES  Final diagnoses:  Musculoskeletal pain of right upper extremity     MEDICATIONS GIVEN DURING THIS VISIT:  Medications - No data to display   NEW OUTPATIENT MEDICATIONS STARTED DURING THIS VISIT:  New Prescriptions   No medications on file      Note:  This document was prepared using Dragon voice recognition software and may include unintentional dictation errors.   Hinda Kehr, MD 08/11/15 [phone]

## 2015-08-11 NOTE — ED Notes (Addendum)
Pt to triage with c/o right arm pain (10/10), "going through to my shoulder", radiation to right shoulder.  Pt denies N/V/dizziness/SOB or cardic history.  Pt reports that she woke up like this and denies trauma injury to site.

## 2015-08-11 NOTE — ED Notes (Signed)
Pt returned from xray at this time.

## 2015-08-11 NOTE — ED Notes (Signed)
MD at bedside. 

## 2015-08-11 NOTE — Discharge Instructions (Signed)
We believe that your symptoms are caused by musculoskeletal strain.  Please read through the included information about additional care such as heating pads, over-the-counter pain medicine.  If you were provided a prescription please use it only as needed and as instructed.  Remember that early mobility and using the affected part of your body is actually better than keeping it immobile.  Follow-up with the doctor listed as recommended or return to the emergency department with new or worsening symptoms that concern you.   Musculoskeletal Pain Musculoskeletal pain is muscle and boney aches and pains. These pains can occur in any part of the body. Your caregiver may treat you without knowing the cause of the pain. They may treat you if blood or urine tests, X-rays, and other tests were normal.  CAUSES There is often not a definite cause or reason for these pains. These pains may be caused by a type of germ (virus). The discomfort may also come from overuse. Overuse includes working out too hard when your body is not fit. Boney aches also come from weather changes. Bone is sensitive to atmospheric pressure changes. HOME CARE INSTRUCTIONS   Ask when your test results will be ready. Make sure you get your test results.  Only take over-the-counter or prescription medicines for pain, discomfort, or fever as directed by your caregiver. If you were given medications for your condition, do not drive, operate machinery or power tools, or sign legal documents for 24 hours. Do not drink alcohol. Do not take sleeping pills or other medications that may interfere with treatment.  Continue all activities unless the activities cause more pain. When the pain lessens, slowly resume normal activities. Gradually increase the intensity and duration of the activities or exercise.  During periods of severe pain, bed rest may be helpful. Lay or sit in any position that is comfortable.  Putting ice on the injured  area.  Put ice in a bag.  Place a towel between your skin and the bag.  Leave the ice on for 15 to 20 minutes, 3 to 4 times a day.  Follow up with your caregiver for continued problems and no reason can be found for the pain. If the pain becomes worse or does not go away, it may be necessary to repeat tests or do additional testing. Your caregiver may need to look further for a possible cause. SEEK IMMEDIATE MEDICAL CARE IF:  You have pain that is getting worse and is not relieved by medications.  You develop chest pain that is associated with shortness or breath, sweating, feeling sick to your stomach (nauseous), or throw up (vomit).  Your pain becomes localized to the abdomen.  You develop any new symptoms that seem different or that concern you. MAKE SURE YOU:   Understand these instructions.  Will watch your condition.  Will get help right away if you are not doing well or get worse.   This information is not intended to replace advice given to you by your health care provider. Make sure you discuss any questions you have with your health care provider.   Document Released: 02/05/2005 Document Revised: 04/30/2011 Document Reviewed: 10/10/2012 Elsevier Interactive Patient Education 2016 [place] therapy can help ease sore, stiff, injured, and tight muscles and joints. Heat relaxes your muscles, which may help ease your pain.  RISKS AND COMPLICATIONS If you have any of the following conditions, do not use heat therapy unless your health care provider has approved:  Poor circulation.  Healing wounds or scarred skin in the area being treated.  Diabetes, heart disease, or high blood pressure.  Not being able to feel (numbness) the area being treated.  Unusual swelling of the area being treated.  Active infections.  Blood clots.  Cancer.  Inability to communicate pain. This may include young children and people who have problems with their  brain function (dementia).  Pregnancy. Heat therapy should only be used on old, pre-existing, or long-lasting (chronic) injuries. Do not use heat therapy on new injuries unless directed by your health care provider. HOW TO USE HEAT THERAPY There are several different kinds of heat therapy, including:  Moist heat pack.  Warm water bath.  Hot water bottle.  Electric heating pad.  Heated gel pack.  Heated wrap.  Electric heating pad. Use the heat therapy method suggested by your health care provider. Follow your health care provider's instructions on when and how to use heat therapy. GENERAL HEAT THERAPY RECOMMENDATIONS  Do not sleep while using heat therapy. Only use heat therapy while you are awake.  Your skin may turn pink while using heat therapy. Do not use heat therapy if your skin turns red.  Do not use heat therapy if you have new pain.  High heat or long exposure to heat can cause burns. Be careful when using heat therapy to avoid burning your skin.  Do not use heat therapy on areas of your skin that are already irritated, such as with a rash or sunburn. SEEK MEDICAL CARE IF:  You have blisters, redness, swelling, or numbness.  You have new pain.  Your pain is worse. MAKE SURE YOU:  Understand these instructions.  Will watch your condition.  Will get help right away if you are not doing well or get worse.   This information is not intended to replace advice given to you by your health care provider. Make sure you discuss any questions you have with your health care provider.   Document Released: 04/30/2011 Document Revised: 02/26/2014 Document Reviewed: 03/31/2013 Elsevier Interactive Patient Education [employer].

## 2015-08-12 ENCOUNTER — Encounter: Payer: Self-pay | Admitting: Family Medicine

## 2015-08-12 ENCOUNTER — Ambulatory Visit (INDEPENDENT_AMBULATORY_CARE_PROVIDER_SITE_OTHER): Payer: Medicare Other | Admitting: Family Medicine

## 2015-08-12 ENCOUNTER — Telehealth: Payer: Self-pay

## 2015-08-12 VITALS — BP 158/82 | HR 88 | Temp 98.3°F | Resp 16 | Ht 60.0 in | Wt 204.7 lb

## 2015-08-12 DIAGNOSIS — I10 Essential (primary) hypertension: Secondary | ICD-10-CM

## 2015-08-12 DIAGNOSIS — M25511 Pain in right shoulder: Secondary | ICD-10-CM

## 2015-08-12 DIAGNOSIS — R809 Proteinuria, unspecified: Secondary | ICD-10-CM | POA: Diagnosis not present

## 2015-08-12 DIAGNOSIS — M542 Cervicalgia: Secondary | ICD-10-CM

## 2015-08-12 DIAGNOSIS — M792 Neuralgia and neuritis, unspecified: Secondary | ICD-10-CM

## 2015-08-12 DIAGNOSIS — E1129 Type 2 diabetes mellitus with other diabetic kidney complication: Secondary | ICD-10-CM | POA: Diagnosis not present

## 2015-08-12 DIAGNOSIS — M5410 Radiculopathy, site unspecified: Secondary | ICD-10-CM

## 2015-08-12 LAB — GLUCOSE, POCT (MANUAL RESULT ENTRY): POC Glucose: 153 mg/dl — AB (ref 70–99)

## 2015-08-12 MED ORDER — CYCLOBENZAPRINE HCL 5 MG PO TABS: 5.0000 mg | ORAL_TABLET | Freq: Three times a day (TID) | ORAL | Status: DC | PRN

## 2015-08-12 MED ORDER — KETOROLAC TROMETHAMINE 60 MG/2ML IM SOLN
60.0000 mg | Freq: Once | INTRAMUSCULAR | Status: AC
  Administered 2015-08-12: 60 mg via INTRAMUSCULAR

## 2015-08-12 MED ORDER — DEXAMETHASONE SODIUM PHOSPHATE 10 MG/ML IJ SOLN: 10.0000 mg | Freq: Once | INTRAMUSCULAR | Status: DC

## 2015-08-12 MED ORDER — METHYLPREDNISOLONE 4 MG PO TBPK: ORAL_TABLET | ORAL | Status: DC

## 2015-08-12 MED ORDER — DEXAMETHASONE SODIUM PHOSPHATE 10 MG/ML IJ SOLN
10.0000 mg | Freq: Once | INTRAMUSCULAR | Status: AC
  Administered 2015-08-12: 10 mg via INTRAMUSCULAR

## 2015-08-12 NOTE — Progress Notes (Signed)
Name: Taylor [name]   MRN: [id number]    DOB: 1933/01/15   Date:08/12/2015       Progress Note  Subjective  Chief Complaint  Chief Complaint  Patient presents with  . Arm Pain    Onset-Wednesday, patient states she went to the ER and they tried to do a X-Ray but could not perform them do not being able to stretch her arm out. Patient is in excruciating pain and took a Percocet to help her symptoms. Pain shoots her from her right shoulder to her fingers and is a sharp pain.    HPI  Radiculitis: she woke up 2 days ago in severe pain. She states pain is on her right arm, but hurts from right side of neck and radiates down her right hand and right scapular area. She denies burning or tingling, she states she feels slightly week. She describe the pain as intense, constant, aching like, worse when she moves her arm or neck. Percocet helps with symptoms but minimally. There is no swelling on her arm, no SOB or chest pain. She was seen at [facility] last night and CXR was unremarkable with mild cardiomegaly and normal EKG. She was not given any medications for her pain.   HTN: bp is usually at goal, but has been very high, likely secondary to her pain, better today than last night while at [facility]  DM: glucose in our office in the 150 range. She denies polyphagia, polydipsia or polyuria. Explained to her that steroids will increase her glucose levels and needs to be very strict about her diet and drink plenty of fluids   Patient Active Problem List   Diagnosis Date Noted  . Hyperlipidemia 08/05/2015  . Vaginal dryness 02/22/2015  . Calculus of kidney 11/10/2014  . Osteoarthritis, multiple sites 09/09/2014  . Diabetes mellitus, type 2 ([place]) 09/09/2014  . Glaucoma 09/09/2014  . BP (high blood pressure) 09/09/2014  . Chronic pain 09/09/2014  . Tinea corporis 09/09/2014  . Umbilical hernia [id number]  . Lump or mass in breast     Past Surgical History  Procedure Laterality Date  . Abdominal  hysterectomy  1958    menorrhagia  . Knee surgery  2009,2011,2012    twice on right and once on left  . Lipoma excision  1998  . Spine surgery  2004  . Tonsillectomy      age of 49  . Colonoscopy  2003    [facility]  . Coronary artery bypass graft Left 01/03/2012,01/24/2012    Left FNA, Left breast [facility] bx  . Breast biopsy Left 2013    stereo, negativie  . Cataract extraction, bilateral Bilateral     1st 06/07/15 and the 2nd 06/21/15  . Cholecystectomy    . Back surgery      Family History  Problem Relation Age of Onset  . Cancer Mother     skin cancer    Social History   Social History  . Marital Status: Divorced    Spouse Name: N/A  . Number of Children: N/A  . Years of Education: N/A   Occupational History  . Not on file.   Social History Main Topics  . Smoking status: Never Smoker   . Smokeless tobacco: Never Used  . Alcohol Use: No  . Drug Use: No  . Sexual Activity: No   Other Topics Concern  . Not on file   Social History Narrative     Current outpatient prescriptions:  .  alendronate (  FOSAMAX) 70 MG tablet, TAKE 1 TABLET WEEKLY, Disp: 4 tablet, Rfl: 11 .  amLODipine (NORVASC) 10 MG tablet, Take 1 tablet (10 mg total) by mouth daily., Disp: 90 tablet, Rfl: 1 .  aspirin EC 81 MG tablet, Take 1 tablet (81 mg total) by mouth daily., Disp: 30 tablet, Rfl: 0 .  BESIVANCE 0.6 % SUSP, Reported on 08/11/2015, Disp: , Rfl: 6 .  bimatoprost (LUMIGAN) 0.01 % SOLN, Place 1 drop into both eyes daily. , Disp: , Rfl:  .  brimonidine-timolol (COMBIGAN) 0.2-0.5 % ophthalmic solution, Place 1 drop into both eyes See admin instructions. 1 drop into both eyes in the morning, then 1 drop into both eyes 8 hours later, Disp: , Rfl:  .  Cholecalciferol (VITAMIN D-1000 MAX ST) 1000 UNITS tablet, Take 1,000 Units by mouth daily. Reported on 08/05/2015, Disp: , Rfl:  .  conjugated estrogens (PREMARIN) vaginal cream, Place 1 Applicatorful vaginally daily., Disp: 42.5 g, Rfl: 5 .  DUREZOL  0.05 % EMUL, Place 1 drop into both eyes 4 (four) times daily. , Disp: , Rfl:  .  fluticasone (FLONASE) 50 MCG/ACT nasal spray, Place 2 sprays into both nostrils daily. , Disp: , Rfl:  .  glucose blood test strip, Use as instructed, Disp: 100 each, Rfl: 12 .  metFORMIN (GLUCOPHAGE) 500 MG tablet, Take 1 tablet (500 mg total) by mouth daily., Disp: 90 tablet, Rfl: 0 .  omeprazole (PRILOSEC) 20 MG capsule, Take 1 capsule (20 mg total) by mouth daily., Disp: 90 capsule, Rfl: 1 .  ONETOUCH DELICA LANCETS FINE MISC, USE ONCE DAILY TO CHECK BLOOD SUGAR, Disp: 100 each, Rfl: 0 .  oxyCODONE-acetaminophen (PERCOCET) 7.5-325 MG tablet, Take 1 tablet by mouth every 8 (eight) hours as needed for severe pain., Disp: 90 tablet, Rfl: 0 .  pravastatin (PRAVACHOL) 40 MG tablet, Take 1 tablet (40 mg total) by mouth at bedtime., Disp: 90 tablet, Rfl: 0 .  PROLENSA 0.07 % SOLN, Reported on 08/11/2015, Disp: , Rfl: 1 .  quinapril (ACCUPRIL) 40 MG tablet, Take 1 tablet (40 mg total) by mouth daily., Disp: 90 tablet, Rfl: 1 .  cyclobenzaprine (FLEXERIL) 5 MG tablet, Take 1 tablet (5 mg total) by mouth 3 (three) times daily as needed for muscle spasms., Disp: 30 tablet, Rfl: 0 .  methylPREDNISolone (MEDROL DOSEPAK) 4 MG TBPK tablet, Take as directed, Disp: 21 tablet, Rfl: 0  Current facility-administered medications:  .  dexamethasone (DECADRON) injection 10 mg, 10 mg, Intramuscular, Once, [name], MD .  ketorolac (TORADOL) injection 60 mg, 60 mg, Intramuscular, Once, [name], MD  No Known Allergies   ROS  Ten systems reviewed and is negative except as mentioned in HPI   Objective  Filed Vitals:   08/12/15 0919  BP: 158/82  Pulse: 88  Temp: 98.3 F (36.8 C)  TempSrc: Oral  Resp: 16  Height: 5' (1.524 m)  Weight: 204 lb 11.2 oz (92.851 kg)  SpO2: 98%    Body mass index is 39.98 kg/(m^2).  Physical Exam  Constitutional: Patient appears well-developed and well-nourished. Obese  She is in   distress. Holding right elbow with left hand, moaning. HEENT: head atraumatic, normocephalic, pupils equal and reactive to light, pain with rom of neck , throat within normal limits Cardiovascular: Normal rate, regular rhythm and normal heart sounds.  No murmur heard. No BLE edema. Pulmonary/Chest: Effort normal and breath sounds normal. No respiratory distress. Abdominal: Soft.  There is no tenderness. Psychiatric: behavior is normal. Judgment and thought  content normal. Muscular Skeletal: decrease rom of right shoulder, only mild abduction but she cried and we stopped, some pain during palpation of cervical spine and right paraspinal muscles, also during right anterior shoulder pain. Normal grip and sensation on both arms. Normal radial pulse, no rashes.   Recent Results (from the past 2160 hour(s))  Lipid panel     Status: None   Collection Time: 08/09/15  9:21 AM  Result Value Ref Range   Cholesterol, Total 127 100 - 199 mg/dL   Triglycerides 105 0 - 149 mg/dL   HDL 47 >39 mg/dL   VLDL Cholesterol Cal 21 5 - 40 mg/dL   LDL Calculated 59 0 - 99 mg/dL   Chol/HDL Ratio 2.7 0.0 - 4.4 ratio units    Comment:                                   T. Chol/HDL Ratio                                             Men  Women                               1/2 Avg.Risk  3.4    3.3                                   Avg.Risk  5.0    4.4                                2X Avg.Risk  9.6    7.1                                3X Avg.Risk 23.4   11.0   Comprehensive metabolic panel     Status: Abnormal   Collection Time: 08/09/15  9:21 AM  Result Value Ref Range   Glucose 160 (H) 65 - 99 mg/dL   BUN 22 8 - 27 mg/dL   Creatinine, Ser 1.38 (H) 0.57 - 1.00 mg/dL   GFR calc non Af Amer 36 (L) >59 mL/min/1.73   GFR calc Af Amer 42 (L) >59 mL/min/1.73   BUN/Creatinine Ratio 16 12 - 28   Sodium 141 134 - 144 mmol/L   Potassium 5.2 3.5 - 5.2 mmol/L   Chloride 105 96 - 106 mmol/L   CO2 22 18 - 29 mmol/L    Calcium 9.3 8.7 - 10.3 mg/dL   Total Protein 6.5 6.0 - 8.5 g/dL   Albumin 4.1 3.5 - 4.7 g/dL   Globulin, Total 2.4 1.5 - 4.5 g/dL   Albumin/Globulin Ratio 1.7 1.2 - 2.2   Bilirubin Total 0.3 0.0 - 1.2 mg/dL   Alkaline Phosphatase 56 39 - 117 IU/L   AST 9 0 - 40 IU/L   ALT 6 0 - 32 IU/L      PHQ2/9: Depression screen [facility] 2/9 08/05/2015 02/22/2015 11/10/2014  Decreased Interest 0 0 0  Down, Depressed, Hopeless 0 0 0  PHQ - 2 Score 0 0 0  Fall Risk: Fall Risk  08/05/2015 02/22/2015 11/10/2014 09/09/2014  Falls in the past year? Yes No No No  Number falls in past yr: 1 - - -  Injury with Fall? No - - -    Assessment & Plan  1. Essential hypertension  Continue medication, bp is better today, recheck in one week   2. Radiculitis  She would not be able to tolerate imaging of neck or shoulder today, can't stay still because of pain - dexamethasone (DECADRON) injection 10 mg; Inject 1 mL (10 mg total) into the muscle once. - ketorolac (TORADOL) injection 60 mg; Inject 2 mLs (60 mg total) into the muscle once. - cyclobenzaprine (FLEXERIL) 5 MG tablet; Take 1 tablet (5 mg total) by mouth 3 (three) times daily as needed for muscle spasms.  Dispense: 30 tablet; Refill: 0 - methylPREDNISolone (MEDROL DOSEPAK) 4 MG TBPK tablet; Take as directed  Dispense: 21 tablet; Refill: 0  3. Right shoulder pain  - dexamethasone (DECADRON) injection 10 mg; Inject 1 mL (10 mg total) into the muscle once. - ketorolac (TORADOL) injection 60 mg; Inject 2 mLs (60 mg total) into the muscle once.  4. Neck pain on right side  - dexamethasone (DECADRON) injection 10 mg; Inject 1 mL (10 mg total) into the muscle once. - ketorolac (TORADOL) injection 60 mg; Inject 2 mLs (60 mg total) into the muscle once.  5. Controlled type 2 diabetes mellitus with microalbuminuria, without long-term current use of insulin ([place])  Follow diabetic diet and check glucose two to three times daily while of Medrol dose pain.

## 2015-08-12 NOTE — Telephone Encounter (Signed)
Dr. Ancil Boozer wanted [place] to check on patient since she was in the office this morning in excruating pain and was given shots with an muscle relaxer. Patient answered my phone called and states the pain is dying down-which is making her feel better and she is also feeling sleepy from the medication.

## 2015-08-19 ENCOUNTER — Ambulatory Visit (INDEPENDENT_AMBULATORY_CARE_PROVIDER_SITE_OTHER): Payer: Medicare Other | Admitting: Family Medicine

## 2015-08-19 ENCOUNTER — Encounter: Payer: Self-pay | Admitting: Family Medicine

## 2015-08-19 VITALS — BP 124/76 | HR 84 | Temp 98.1°F | Resp 18 | Ht 60.0 in | Wt 210.6 lb

## 2015-08-19 DIAGNOSIS — M25511 Pain in right shoulder: Secondary | ICD-10-CM

## 2015-08-19 DIAGNOSIS — M5412 Radiculopathy, cervical region: Secondary | ICD-10-CM

## 2015-08-19 DIAGNOSIS — E1129 Type 2 diabetes mellitus with other diabetic kidney complication: Secondary | ICD-10-CM | POA: Diagnosis not present

## 2015-08-19 DIAGNOSIS — R809 Proteinuria, unspecified: Secondary | ICD-10-CM | POA: Diagnosis not present

## 2015-08-19 NOTE — Progress Notes (Signed)
Name: Taylor [name]   MRN: [id number]    DOB: 1932-03-25   Date:08/19/2015       Progress Note  Subjective  Chief Complaint  Chief Complaint  Patient presents with  . Shoulder Pain    1 week F/U, patient states her pain has decreased going from a 20 to a 10 today on our pain scale. Patient states she is feeling a little better but it is not gone away completely.     HPI  Cervical pain with radiculitis: she was seen 1 weeks ago with severe pain on right side of neck and shoulder, she was given Medrol dose pain and pain is now bearable, still has decrease in abduction of right shoulder, but today she also states she has numbness on 5th right finger and weakness of right hand. She had a long history of neck problems, but symptoms of tingling, weakness started suddenly with right shoulder pain on 08/11/2015.  DMII: her glucose went up to 450 once while taking medrol dose pain, average of 200's, glucose was 197 this am. She has not noticed polydipsia or polyuria.   Patient Active Problem List   Diagnosis Date Noted  . Hyperlipidemia 08/05/2015  . Vaginal dryness 02/22/2015  . Calculus of kidney 11/10/2014  . Osteoarthritis, multiple sites 09/09/2014  . Diabetes mellitus, type 2 ([place]) 09/09/2014  . Glaucoma 09/09/2014  . BP (high blood pressure) 09/09/2014  . Chronic pain 09/09/2014  . Tinea corporis 09/09/2014  . Umbilical hernia [id number]  . Lump or mass in breast     Past Surgical History  Procedure Laterality Date  . Abdominal hysterectomy  1958    menorrhagia  . Knee surgery  2009,2011,2012    twice on right and once on left  . Lipoma excision  1998  . Spine surgery  2004  . Tonsillectomy      age of 55  . Colonoscopy  2003    [facility]  . Coronary artery bypass graft Left 01/03/2012,01/24/2012    Left FNA, Left breast [facility] bx  . Breast biopsy Left 2013    stereo, negativie  . Cataract extraction, bilateral Bilateral     1st 06/07/15 and the 2nd 06/21/15  .  Cholecystectomy    . Back surgery      Family History  Problem Relation Age of Onset  . Cancer Mother     skin cancer    Social History   Social History  . Marital Status: Divorced    Spouse Name: N/A  . Number of Children: N/A  . Years of Education: N/A   Occupational History  . Not on file.   Social History Main Topics  . Smoking status: Never Smoker   . Smokeless tobacco: Never Used  . Alcohol Use: No  . Drug Use: No  . Sexual Activity: No   Other Topics Concern  . Not on file   Social History Narrative     Current outpatient prescriptions:  .  alendronate (FOSAMAX) 70 MG tablet, TAKE 1 TABLET WEEKLY, Disp: 4 tablet, Rfl: 11 .  amLODipine (NORVASC) 10 MG tablet, Take 1 tablet (10 mg total) by mouth daily., Disp: 90 tablet, Rfl: 1 .  aspirin EC 81 MG tablet, Take 1 tablet (81 mg total) by mouth daily., Disp: 30 tablet, Rfl: 0 .  BESIVANCE 0.6 % SUSP, Reported on 08/11/2015, Disp: , Rfl: 6 .  bimatoprost (LUMIGAN) 0.01 % SOLN, Place 1 drop into both eyes daily. , Disp: , Rfl:  .  brimonidine-timolol (COMBIGAN) 0.2-0.5 % ophthalmic solution, Place 1 drop into both eyes See admin instructions. 1 drop into both eyes in the morning, then 1 drop into both eyes 8 hours later, Disp: , Rfl:  .  Cholecalciferol (VITAMIN D-1000 MAX ST) 1000 UNITS tablet, Take 1,000 Units by mouth daily. Reported on 08/05/2015, Disp: , Rfl:  .  conjugated estrogens (PREMARIN) vaginal cream, Place 1 Applicatorful vaginally daily., Disp: 42.5 g, Rfl: 5 .  cyclobenzaprine (FLEXERIL) 5 MG tablet, Take 1 tablet (5 mg total) by mouth 3 (three) times daily as needed for muscle spasms., Disp: 30 tablet, Rfl: 0 .  DUREZOL 0.05 % EMUL, Place 1 drop into both eyes 4 (four) times daily. , Disp: , Rfl:  .  fluticasone (FLONASE) 50 MCG/ACT nasal spray, Place 2 sprays into both nostrils daily. , Disp: , Rfl:  .  glucose blood test strip, Use as instructed, Disp: 100 each, Rfl: 12 .  metFORMIN (GLUCOPHAGE) 500 MG  tablet, Take 1 tablet (500 mg total) by mouth daily., Disp: 90 tablet, Rfl: 0 .  omeprazole (PRILOSEC) 20 MG capsule, Take 1 capsule (20 mg total) by mouth daily., Disp: 90 capsule, Rfl: 1 .  ONETOUCH DELICA LANCETS FINE MISC, USE ONCE DAILY TO CHECK BLOOD SUGAR, Disp: 100 each, Rfl: 0 .  oxyCODONE-acetaminophen (PERCOCET) 7.5-325 MG tablet, Take 1 tablet by mouth every 8 (eight) hours as needed for severe pain., Disp: 90 tablet, Rfl: 0 .  pravastatin (PRAVACHOL) 40 MG tablet, Take 1 tablet (40 mg total) by mouth at bedtime., Disp: 90 tablet, Rfl: 0 .  PROLENSA 0.07 % SOLN, Reported on 08/11/2015, Disp: , Rfl: 1 .  quinapril (ACCUPRIL) 40 MG tablet, Take 1 tablet (40 mg total) by mouth daily., Disp: 90 tablet, Rfl: 1  No Known Allergies   ROS  Ten systems reviewed and is negative except as mentioned in HPI   Objective  Filed Vitals:   08/19/15 0948  BP: 124/76  Pulse: 84  Temp: 98.1 F (36.7 C)  TempSrc: Oral  Resp: 18  Height: 5' (1.524 m)  Weight: 210 lb 9.6 oz (95.528 kg)  SpO2: 98%    Body mass index is 41.13 kg/(m^2).  Physical Exam  Constitutional: Patient appears well-developed and well-nourished. Obese No distress.  HEENT: head atraumatic, normocephalic, pupils equal and reactive to light, decrease in rom, worse with extension or right lateral bending, pain during palpation of right posterior neckthroat within normal limits Cardiovascular: Normal rate, regular rhythm and normal heart sounds.  No murmur heard. No BLE edema. Pulmonary/Chest: Effort normal and breath sounds normal. No respiratory distress. Abdominal: Soft.  There is no tenderness. Psychiatric: Patient has a normal mood and affect. behavior is normal. Judgment and thought content normal. Muscular skeletal: pain during palpation of trapezium muscle, and anterior shoulder, she is right handed and grip is 5/5 but weaker on the right side, paresthesia on right lateral 5th finger  Recent Results (from the past  2160 hour(s))  Lipid panel     Status: None   Collection Time: 08/09/15  9:21 AM  Result Value Ref Range   Cholesterol, Total 127 100 - 199 mg/dL   Triglycerides 105 0 - 149 mg/dL   HDL 47 >39 mg/dL   VLDL Cholesterol Cal 21 5 - 40 mg/dL   LDL Calculated 59 0 - 99 mg/dL   Chol/HDL Ratio 2.7 0.0 - 4.4 ratio units    Comment:  T. Chol/HDL Ratio                                             Men  Women                               1/2 Avg.Risk  3.4    3.3                                   Avg.Risk  5.0    4.4                                2X Avg.Risk  9.6    7.1                                3X Avg.Risk 23.4   11.0   Comprehensive metabolic panel     Status: Abnormal   Collection Time: 08/09/15  9:21 AM  Result Value Ref Range   Glucose 160 (H) 65 - 99 mg/dL   BUN 22 8 - 27 mg/dL   Creatinine, Ser 1.38 (H) 0.57 - 1.00 mg/dL   GFR calc non Af Amer 36 (L) >59 mL/min/1.73   GFR calc Af Amer 42 (L) >59 mL/min/1.73   BUN/Creatinine Ratio 16 12 - 28   Sodium 141 134 - 144 mmol/L   Potassium 5.2 3.5 - 5.2 mmol/L   Chloride 105 96 - 106 mmol/L   CO2 22 18 - 29 mmol/L   Calcium 9.3 8.7 - 10.3 mg/dL   Total Protein 6.5 6.0 - 8.5 g/dL   Albumin 4.1 3.5 - 4.7 g/dL   Globulin, Total 2.4 1.5 - 4.5 g/dL   Albumin/Globulin Ratio 1.7 1.2 - 2.2   Bilirubin Total 0.3 0.0 - 1.2 mg/dL   Alkaline Phosphatase 56 39 - 117 IU/L   AST 9 0 - 40 IU/L   ALT 6 0 - 32 IU/L  POCT Glucose (CBG)     Status: Abnormal   Collection Time: 08/12/15  9:55 AM  Result Value Ref Range   POC Glucose 153 (A) 70 - 99 mg/dl    PHQ2/9: Depression screen [facility] 2/9 08/05/2015 02/22/2015 11/10/2014  Decreased Interest 0 0 0  Down, Depressed, Hopeless 0 0 0  PHQ - 2 Score 0 0 0     Fall Risk: Fall Risk  08/05/2015 02/22/2015 11/10/2014 09/09/2014  Falls in the past year? Yes No No No  Number falls in past yr: 1 - - -  Injury with Fall? No - - -     Assessment & Plan  1. Radiculitis of  right cervical region  - MR Cervical Spine Wo Contrast; Future  2. Right shoulder pain  Seems to be secondary to neck problems.   3. Controlled type 2 diabetes mellitus with microalbuminuria, without long-term current use of insulin ([facility])  Glucose has gone up secondary to steroids, needs to follow diabetic diet and keep monitoring, trending down now.

## 2015-08-19 NOTE — Addendum Note (Signed)
Addended by: Inda Coke on: 08/19/2015 03:02 PM   Modules accepted: Orders

## 2015-08-22 ENCOUNTER — Telehealth: Payer: Self-pay

## 2015-08-22 NOTE — Telephone Encounter (Signed)
Patient was informed via Taylor [name], about her upcoming appt with Dr. Sharlet Salina on 10/03/15 @ 10:30am/

## 2015-09-01 ENCOUNTER — Telehealth: Payer: Self-pay | Admitting: Family Medicine

## 2015-09-01 NOTE — Telephone Encounter (Signed)
Not due for refill until July 16th and will need to be seen for drug screen also

## 2015-09-01 NOTE — Telephone Encounter (Signed)
Patient informed. 

## 2015-09-01 NOTE — Telephone Encounter (Signed)
Patient calls with complaint for shoulder pain that is going down her arm again. She is asking for a prescription for the pain. Please send to [email or web address]. Have been going on for the past 3 days and its getting worse. Not able to sleep.

## 2015-09-05 DIAGNOSIS — M545 Low back pain: Secondary | ICD-10-CM | POA: Diagnosis not present

## 2015-09-05 DIAGNOSIS — G8929 Other chronic pain: Secondary | ICD-10-CM | POA: Diagnosis not present

## 2015-09-06 ENCOUNTER — Other Ambulatory Visit: Payer: Self-pay | Admitting: Family Medicine

## 2015-09-06 ENCOUNTER — Ambulatory Visit: Payer: Medicare Other

## 2015-09-09 ENCOUNTER — Other Ambulatory Visit: Payer: Self-pay

## 2015-09-09 DIAGNOSIS — K219 Gastro-esophageal reflux disease without esophagitis: Secondary | ICD-10-CM

## 2015-09-09 MED ORDER — METFORMIN HCL 500 MG PO TABS: 500.0000 mg | ORAL_TABLET | Freq: Every day | ORAL | Status: DC

## 2015-09-09 NOTE — Telephone Encounter (Signed)
Got a fax from [facility] requesting a refill of this patient's Metformin 500mg   Refill request was sent to Dr. [name] for approval and submission.

## 2015-09-12 ENCOUNTER — Telehealth: Payer: Self-pay | Admitting: Family Medicine

## 2015-09-13 ENCOUNTER — Other Ambulatory Visit: Payer: Self-pay | Admitting: Family Medicine

## 2015-09-13 DIAGNOSIS — M5410 Radiculopathy, site unspecified: Secondary | ICD-10-CM

## 2015-09-13 DIAGNOSIS — G8929 Other chronic pain: Secondary | ICD-10-CM

## 2015-09-13 MED ORDER — CYCLOBENZAPRINE HCL 5 MG PO TABS: 5.0000 mg | ORAL_TABLET | Freq: Three times a day (TID) | ORAL | 0 refills | Status: DC | PRN

## 2015-09-13 MED ORDER — OXYCODONE-ACETAMINOPHEN 7.5-325 MG PO TABS: 1.0000 | ORAL_TABLET | Freq: Two times a day (BID) | ORAL | 0 refills | Status: DC | PRN

## 2015-09-13 NOTE — Telephone Encounter (Signed)
Patient notified and states her arm is still bothering her and radiating down her fingers. Can you call in some more of her muscle relaxer.

## 2015-09-13 NOTE — Telephone Encounter (Signed)
I will give her 33 until she sees pain clinic, she will need to take at most two pills daily to make it last

## 2015-09-13 NOTE — Telephone Encounter (Signed)
Patient urine screen is back and last refill was:  08/05/2015  oxyCODONE-acetaminophen (PERCOCET) 7.5-325 MG tablet [id number]  Order Details  Dose: 1 tablet Route: Oral Frequency: Every 8 hours PRN for severe pain Note to Pharmacy: Refill 06/22/2015    Dispense Quantity:  90 tablet Refills:  0 Fills remaining:  --      Sig: Take 1 tablet by mouth every 8 (eight) hours as needed for severe pain.

## 2015-09-13 NOTE — Telephone Encounter (Signed)
done

## 2015-09-21 ENCOUNTER — Encounter: Payer: Self-pay | Admitting: Family Medicine

## 2015-09-23 ENCOUNTER — Telehealth: Payer: Self-pay | Admitting: Family Medicine

## 2015-09-23 NOTE — Telephone Encounter (Signed)
Patient notified

## 2015-09-23 NOTE — Telephone Encounter (Signed)
Go to the first appointment. They both can take care of pain

## 2015-09-28 DIAGNOSIS — H21233 Degeneration of iris (pigmentary), bilateral: Secondary | ICD-10-CM | POA: Diagnosis not present

## 2015-09-29 DIAGNOSIS — H35342 Macular cyst, hole, or pseudohole, left eye: Secondary | ICD-10-CM | POA: Diagnosis not present

## 2015-09-29 DIAGNOSIS — I10 Essential (primary) hypertension: Secondary | ICD-10-CM | POA: Diagnosis not present

## 2015-09-29 DIAGNOSIS — H18412 Arcus senilis, left eye: Secondary | ICD-10-CM | POA: Diagnosis not present

## 2015-09-29 DIAGNOSIS — H401190 Primary open-angle glaucoma, unspecified eye, stage unspecified: Secondary | ICD-10-CM | POA: Diagnosis not present

## 2015-10-10 DIAGNOSIS — H401190 Primary open-angle glaucoma, unspecified eye, stage unspecified: Secondary | ICD-10-CM | POA: Diagnosis not present

## 2015-10-10 DIAGNOSIS — Z961 Presence of intraocular lens: Secondary | ICD-10-CM | POA: Diagnosis not present

## 2015-10-10 DIAGNOSIS — H20023 Recurrent acute iridocyclitis, bilateral: Secondary | ICD-10-CM | POA: Diagnosis not present

## 2015-10-13 ENCOUNTER — Telehealth: Payer: Self-pay | Admitting: Family Medicine

## 2015-10-13 NOTE — Telephone Encounter (Signed)
Pt informed

## 2015-10-13 NOTE — Telephone Encounter (Signed)
Sorry, no. She will need to wait for appointment

## 2015-10-25 DIAGNOSIS — H20043 Secondary noninfectious iridocyclitis, bilateral: Secondary | ICD-10-CM | POA: Diagnosis not present

## 2015-11-07 ENCOUNTER — Telehealth: Payer: Self-pay

## 2015-11-07 NOTE — Telephone Encounter (Signed)
She can come in today for follow up

## 2015-11-07 NOTE — Telephone Encounter (Signed)
Patient called Call A Nurse and states she would like some cough medication for something for runny nose and congestion. States symptoms started 2 days ago. States, she has been hoarse and is losing voice, states her throat is not sore. Denies SOB, Chest Pain. Denies Nausea or vomiting. States has a stuffy nose. States cough is non-productive. Arcola Jansky, RN informed patient sopping the cough could worsen symptoms and lead to pneumonia. So informed Taylor [name] to use Inhaler Advair as instructed by PCP and drink warm fluids and use humidifier.

## 2015-11-07 NOTE — Telephone Encounter (Signed)
You are completely booked for today therefore I was able to get her in tomorrow afternoon

## 2015-11-08 ENCOUNTER — Ambulatory Visit (INDEPENDENT_AMBULATORY_CARE_PROVIDER_SITE_OTHER): Payer: Medicare Other | Admitting: Family Medicine

## 2015-11-08 ENCOUNTER — Encounter: Payer: Self-pay | Admitting: Family Medicine

## 2015-11-08 VITALS — BP 110/64 | HR 84 | Temp 97.6°F | Resp 16 | Ht 60.0 in | Wt 206.8 lb

## 2015-11-08 DIAGNOSIS — E1165 Type 2 diabetes mellitus with hyperglycemia: Secondary | ICD-10-CM | POA: Diagnosis not present

## 2015-11-08 DIAGNOSIS — R490 Dysphonia: Secondary | ICD-10-CM

## 2015-11-08 DIAGNOSIS — E1121 Type 2 diabetes mellitus with diabetic nephropathy: Secondary | ICD-10-CM | POA: Diagnosis not present

## 2015-11-08 DIAGNOSIS — J069 Acute upper respiratory infection, unspecified: Secondary | ICD-10-CM | POA: Diagnosis not present

## 2015-11-08 DIAGNOSIS — IMO0002 Reserved for concepts with insufficient information to code with codable children: Secondary | ICD-10-CM

## 2015-11-08 MED ORDER — PREDNISONE 10 MG PO TABS: 10.0000 mg | ORAL_TABLET | Freq: Every day | ORAL | 0 refills | Status: DC

## 2015-11-08 NOTE — Progress Notes (Signed)
Name: Taylor [name]   MRN: [id number]    DOB: Oct 23, 1932   Date:11/08/2015       Progress Note  Subjective  Chief Complaint  Chief Complaint  Patient presents with  . Cough    Patient states for the past 4 days she has been experiencing hoarseness, sneezing. Patient has been doing hot tea with honey and has been using her inhaler and humidifer with no relief.    HPI  URI: she states she has developed cold symptoms over the past 4 days, she has sneezing, mild dry cough but the worse symptom is hoarseness. She has been drinking warm tea with honey but is not helping. She denies SOB or fever, no sore throat. She has been tired, she has lost her appetite  DM: she states glucose at home has been controlled in the low 100's, no polydipsia or polyuria   Patient Active Problem List   Diagnosis Date Noted  . Hyperlipidemia 08/05/2015  . Vaginal dryness 02/22/2015  . Calculus of kidney 11/10/2014  . Osteoarthritis, multiple sites 09/09/2014  . Diabetes mellitus, type 2 ([place]) 09/09/2014  . Glaucoma 09/09/2014  . BP (high blood pressure) 09/09/2014  . Chronic pain 09/09/2014  . Tinea corporis 09/09/2014  . Umbilical hernia [id number]  . Lump or mass in breast     Past Surgical History:  Procedure Laterality Date  . ABDOMINAL HYSTERECTOMY  1958   menorrhagia  . BACK SURGERY    . BREAST BIOPSY Left 2013   stereo, negativie  . CATARACT EXTRACTION, BILATERAL Bilateral    1st 06/07/15 and the 2nd 06/21/15  . CHOLECYSTECTOMY    . COLONOSCOPY  2003   [facility]  . CORONARY ARTERY BYPASS GRAFT Left 01/03/2012,01/24/2012   Left FNA, Left breast [facility] bx  . KNEE SURGERY  2009,2011,2012   twice on right and once on left  . LIPOMA EXCISION  1998  . [place]  2004  . TONSILLECTOMY     age of 22    Family History  Problem Relation Age of Onset  . Cancer Mother     skin cancer    Social History   Social History  . Marital status: Divorced    Spouse name: N/A  . Number of children:  N/A  . Years of education: N/A   Occupational History  . Not on file.   Social History Main Topics  . Smoking status: Never Smoker  . Smokeless tobacco: Never Used  . Alcohol use No  . Drug use: No  . Sexual activity: No   Other Topics Concern  . Not on file   Social History Narrative  . No narrative on file     Current Outpatient Prescriptions:  .  alendronate (FOSAMAX) 70 MG tablet, TAKE 1 TABLET WEEKLY, Disp: 4 tablet, Rfl: 11 .  amLODipine (NORVASC) 10 MG tablet, Take 1 tablet (10 mg total) by mouth daily., Disp: 90 tablet, Rfl: 1 .  bimatoprost (LUMIGAN) 0.01 % SOLN, Place 1 drop into both eyes daily. , Disp: , Rfl:  .  brimonidine-timolol (COMBIGAN) 0.2-0.5 % ophthalmic solution, Place 1 drop into both eyes See admin instructions. 1 drop into both eyes in the morning, then 1 drop into both eyes 8 hours later, Disp: , Rfl:  .  Cholecalciferol (VITAMIN D-1000 MAX ST) 1000 UNITS tablet, Take 1,000 Units by mouth daily. Reported on 08/05/2015, Disp: , Rfl:  .  conjugated estrogens (PREMARIN) vaginal cream, Place 1 Applicatorful vaginally daily., Disp: 42.5 g, Rfl: 5 .  cyclobenzaprine (FLEXERIL) 5 MG tablet, Take 1 tablet (5 mg total) by mouth 3 (three) times daily as needed for muscle spasms., Disp: 30 tablet, Rfl: 0 .  DUREZOL 0.05 % EMUL, Place 1 drop into both eyes 4 (four) times daily. , Disp: , Rfl:  .  fluticasone (FLONASE) 50 MCG/ACT nasal spray, Place 2 sprays into both nostrils daily. , Disp: , Rfl:  .  glucose blood test strip, Use as instructed, Disp: 100 each, Rfl: 12 .  metFORMIN (GLUCOPHAGE) 500 MG tablet, Take 1 tablet (500 mg total) by mouth daily., Disp: 90 tablet, Rfl: 0 .  omeprazole (PRILOSEC) 20 MG capsule, Take 1 capsule (20 mg total) by mouth daily., Disp: 90 capsule, Rfl: 1 .  ONETOUCH DELICA LANCETS FINE MISC, USE ONCE DAILY TO CHECK BLOOD SUGAR, Disp: 100 each, Rfl: 0 .  oxyCODONE-acetaminophen (PERCOCET) 7.5-325 MG tablet, Take 1 tablet by mouth 2 (two)  times daily as needed for severe pain., Disp: 60 tablet, Rfl: 0 .  pravastatin (PRAVACHOL) 40 MG tablet, Take 1 tablet (40 mg total) by mouth at bedtime., Disp: 90 tablet, Rfl: 0 .  PROLENSA 0.07 % SOLN, Reported on 08/11/2015, Disp: , Rfl: 1 .  quinapril (ACCUPRIL) 40 MG tablet, Take 1 tablet (40 mg total) by mouth daily., Disp: 90 tablet, Rfl: 1  No Known Allergies   ROS  Ten systems reviewed and is negative except as mentioned in HPI   Objective  Vitals:   11/08/15 1533  Pulse: 84  Resp: 16  Temp: 97.6 F (36.4 C)  TempSrc: Oral  SpO2: 98%  Weight: 206 lb 12.8 oz (93.8 kg)  Height: 5' (1.524 m)    Body mass index is 40.39 kg/m.  Physical Exam  Constitutional: Patient appears well-developed and well-nourished. Obese  No distress.  HEENT: head atraumatic, normocephalic, pupils equal and reactive to light, ears normal TM neck supple, throat within normal limits Cardiovascular: Normal rate, regular rhythm and normal heart sounds.  No murmur heard. No BLE edema. Pulmonary/Chest: Effort normal and breath sounds normal. No respiratory distress. Abdominal: Soft.  There is no tenderness. Psychiatric: Patient has a normal mood and affect. behavior is normal. Judgment and thought content normal.  Recent Results (from the past 2160 hour(s))  POCT Glucose (CBG)     Status: Abnormal   Collection Time: 08/12/15  9:55 AM  Result Value Ref Range   POC Glucose 153 (A) 70 - 99 mg/dl      PHQ2/9: Depression screen [facility] 2/9 11/08/2015 08/05/2015 02/22/2015 11/10/2014  Decreased Interest 0 0 0 0  Down, Depressed, Hopeless 0 0 0 0  PHQ - 2 Score 0 0 0 0     Fall Risk: Fall Risk  11/08/2015 08/05/2015 02/22/2015 11/10/2014 09/09/2014  Falls in the past year? No Yes No No No  Number falls in past yr: - 1 - - -  Injury with Fall? - No - - -      Functional Status Survey: Is the patient deaf or have difficulty hearing?: No Does the patient have difficulty seeing, even when wearing  glasses/contacts?: No Does the patient have difficulty concentrating, remembering, or making decisions?: No Does the patient have difficulty walking or climbing stairs?: Yes (difficulty walking) Does the patient have difficulty dressing or bathing?: No Does the patient have difficulty doing errands alone such as visiting a doctor's office or shopping?: No    Assessment & Plan  1. Upper respiratory infection  Advised Coricidin HbP  2. Hoarseness  - predniSONE (  DELTASONE) 10 MG tablet; Take 1 tablet (10 mg total) by mouth daily with breakfast.  Dispense: 6 tablet; Refill: 0  3. Uncontrolled type 2 diabetes mellitus with diabetic nephropathy, without long-term current use of insulin ([place])  Explained importance of following diabetic diet and monitor glucose since she will be taking steroids

## 2015-11-14 ENCOUNTER — Encounter: Payer: Self-pay | Admitting: Anesthesiology

## 2015-11-14 ENCOUNTER — Ambulatory Visit: Payer: Medicare Other | Attending: Anesthesiology | Admitting: Anesthesiology

## 2015-11-14 VITALS — BP 159/77 | HR 88 | Temp 98.4°F | Resp 16 | Ht 61.0 in | Wt 206.0 lb

## 2015-11-14 DIAGNOSIS — M109 Gout, unspecified: Secondary | ICD-10-CM | POA: Insufficient documentation

## 2015-11-14 DIAGNOSIS — I10 Essential (primary) hypertension: Secondary | ICD-10-CM | POA: Diagnosis not present

## 2015-11-14 DIAGNOSIS — Z7984 Long term (current) use of oral hypoglycemic drugs: Secondary | ICD-10-CM | POA: Insufficient documentation

## 2015-11-14 DIAGNOSIS — M545 Low back pain: Secondary | ICD-10-CM | POA: Insufficient documentation

## 2015-11-14 DIAGNOSIS — R928 Other abnormal and inconclusive findings on diagnostic imaging of breast: Secondary | ICD-10-CM | POA: Diagnosis not present

## 2015-11-14 DIAGNOSIS — Z981 Arthrodesis status: Secondary | ICD-10-CM | POA: Diagnosis not present

## 2015-11-14 DIAGNOSIS — M25511 Pain in right shoulder: Secondary | ICD-10-CM | POA: Insufficient documentation

## 2015-11-14 DIAGNOSIS — M25551 Pain in right hip: Secondary | ICD-10-CM | POA: Diagnosis present

## 2015-11-14 DIAGNOSIS — Z823 Family history of stroke: Secondary | ICD-10-CM | POA: Diagnosis not present

## 2015-11-14 DIAGNOSIS — Z9049 Acquired absence of other specified parts of digestive tract: Secondary | ICD-10-CM | POA: Insufficient documentation

## 2015-11-14 DIAGNOSIS — E669 Obesity, unspecified: Secondary | ICD-10-CM | POA: Diagnosis not present

## 2015-11-14 DIAGNOSIS — R200 Anesthesia of skin: Secondary | ICD-10-CM | POA: Diagnosis not present

## 2015-11-14 DIAGNOSIS — M5431 Sciatica, right side: Secondary | ICD-10-CM | POA: Diagnosis not present

## 2015-11-14 DIAGNOSIS — Z9889 Other specified postprocedural states: Secondary | ICD-10-CM | POA: Diagnosis not present

## 2015-11-14 DIAGNOSIS — E785 Hyperlipidemia, unspecified: Secondary | ICD-10-CM | POA: Insufficient documentation

## 2015-11-14 DIAGNOSIS — K219 Gastro-esophageal reflux disease without esophagitis: Secondary | ICD-10-CM | POA: Diagnosis not present

## 2015-11-14 DIAGNOSIS — Z90710 Acquired absence of both cervix and uterus: Secondary | ICD-10-CM | POA: Insufficient documentation

## 2015-11-14 DIAGNOSIS — Z951 Presence of aortocoronary bypass graft: Secondary | ICD-10-CM | POA: Diagnosis not present

## 2015-11-14 DIAGNOSIS — M542 Cervicalgia: Secondary | ICD-10-CM | POA: Insufficient documentation

## 2015-11-14 DIAGNOSIS — M25552 Pain in left hip: Secondary | ICD-10-CM | POA: Insufficient documentation

## 2015-11-14 DIAGNOSIS — E119 Type 2 diabetes mellitus without complications: Secondary | ICD-10-CM | POA: Diagnosis not present

## 2015-11-14 DIAGNOSIS — H409 Unspecified glaucoma: Secondary | ICD-10-CM | POA: Insufficient documentation

## 2015-11-14 DIAGNOSIS — Z79899 Other long term (current) drug therapy: Secondary | ICD-10-CM | POA: Diagnosis not present

## 2015-11-14 DIAGNOSIS — G8929 Other chronic pain: Secondary | ICD-10-CM | POA: Diagnosis not present

## 2015-11-14 DIAGNOSIS — M961 Postlaminectomy syndrome, not elsewhere classified: Secondary | ICD-10-CM

## 2015-11-14 DIAGNOSIS — M5386 Other specified dorsopathies, lumbar region: Secondary | ICD-10-CM

## 2015-11-14 NOTE — Patient Instructions (Signed)
Epidural Steroid Injection Patient Information  Description: The epidural space surrounds the nerves as they exit the spinal cord.  In some patients, the nerves can be compressed and inflamed by a bulging disc or a tight spinal canal (spinal stenosis).  By injecting steroids into the epidural space, we can bring irritated nerves into direct contact with a potentially helpful medication.  These steroids act directly on the irritated nerves and can reduce swelling and inflammation which often leads to decreased pain.  Epidural steroids may be injected anywhere along the spine and from the neck to the low back depending upon the location of your pain.   After numbing the skin with local anesthetic (like Novocaine), a small needle is passed into the epidural space slowly.  You may experience a sensation of pressure while this is being done.  The entire block usually last less than 10 minutes.  Conditions which may be treated by epidural steroids:   Low back and leg pain  Neck and arm pain  Spinal stenosis  Post-laminectomy syndrome  Herpes zoster (shingles) pain  Pain from compression fractures  Preparation for the injection:  1. Do not eat any solid food or dairy products within 8 hours of your appointment.  2. You may drink clear liquids up to 3 hours before appointment.  Clear liquids include water, black coffee, juice or soda.  No milk or cream please. 3. You may take your regular medication, including pain medications, with a sip of water before your appointment  Diabetics should hold regular insulin (if taken separately) and take 1/2 normal NPH dos the morning of the procedure.  Carry some sugar containing items with you to your appointment. 4. A driver must accompany you and be prepared to drive you home after your procedure.  5. Bring all your current medications with your. 6. An IV may be inserted and sedation may be given at the discretion of the physician.   7. A blood pressure  cuff, EKG and other monitors will often be applied during the procedure.  Some patients may need to have extra oxygen administered for a short period. 8. You will be asked to provide medical information, including your allergies, prior to the procedure.  We must know immediately if you are taking blood thinners (like Coumadin/Warfarin)  Or if you are allergic to IV iodine contrast (dye). We must know if you could possible be pregnant.  Possible side-effects:  Bleeding from needle site  Infection (rare, may require surgery)  Nerve injury (rare)  Numbness & tingling (temporary)  Difficulty urinating (rare, temporary)  Spinal headache ( a headache worse with upright posture)  Light -headedness (temporary)  Pain at injection site (several days)  Decreased blood pressure (temporary)  Weakness in arm/leg (temporary)  Pressure sensation in back/neck (temporary)  Call if you experience:  Fever/chills associated with headache or increased back/neck pain.  Headache worsened by an upright position.  New onset weakness or numbness of an extremity below the injection site  Hives or difficulty breathing (go to the emergency room)  Inflammation or drainage at the infection site  Severe back/neck pain  Any new symptoms which are concerning to you  Please note:  Although the local anesthetic injected can often make your back or neck feel good for several hours after the injection, the pain will likely return.  It takes 3-7 days for steroids to work in the epidural space.  You may not notice any pain relief for at least that one week.    If effective, we will often do a series of three injections spaced 3-6 weeks apart to maximally decrease your pain.  After the initial series, we generally will wait several months before considering a repeat injection of the same type.  If you have any questions, please call [phone] [facility] Clinic[facility] R[facility]KS AND  COMPLICATIONS  What are the risk, side effects and possible complications? Generally speaking, most procedures are safe.  However, with any procedure there are risks, side effects, and the possibility of complications.  The risks and complications are dependent upon the sites that are lesioned, or the type of nerve block to be performed.  The closer the procedure is to the spine, the more serious the risks are.  Great care is taken when placing the radio frequency needles, block needles or lesioning probes, but sometimes complications can occur. 1. Infection: Any time there is an injection through the skin, there is a risk of infection.  This is why sterile conditions are used for these blocks.  There are four possible types of infection. 1. Localized skin infection. 2. Central Nervous System Infection-This can be in the form of Meningitis, which can be deadly. 3. Epidural Infections-This can be in the form of an epidural abscess, which can cause pressure inside of the spine, causing compression of the spinal cord with subsequent paralysis. This would require an emergency surgery to decompress, and there are no guarantees that the patient would recover from the paralysis. 4. Discitis-This is an infection of the intervertebral discs.  It occurs in about 1% of discography procedures.  It is difficult to treat and it may lead to surgery.        2. Pain: the needles have to go through skin and soft tissues, will cause soreness.       3. Damage to internal structures:  The nerves to be lesioned may be near blood vessels or    other nerves which can be potentially damaged.       4. Bleeding: Bleeding is more common if the patient is taking blood thinners such as  aspirin, Coumadin, Ticiid, Plavix, etc., or if he/she have some genetic predisposition  such as hemophilia. Bleeding into the spinal canal can cause compression of the spinal  cord with subsequent paralysis.  This would require an emergency surgery  to  decompress and there are no guarantees that the patient would recover from the  paralysis.       5. Pneumothorax:  Puncturing of a lung is a possibility, every time a needle is introduced in  the area of the chest or upper back.  Pneumothorax refers to free air around the  collapsed lung(s), inside of the thoracic cavity (chest cavity).  Another two possible  complications related to a similar event would include: Hemothorax and Chylothorax.   These are variations of the Pneumothorax, where instead of air around the collapsed  lung(s), you may have blood or chyle, respectively.       6. Spinal headaches: They may occur with any procedures in the area of the spine.       7. Persistent CSF (Cerebro-Spinal Fluid) leakage: This is a rare problem, but may occur  with prolonged intrathecal or epidural catheters either due to the formation of a fistulous  track or a dural tear.       8. Nerve damage: By working so close to the spinal cord, there is always a possibility of  nerve damage, which could be   as serious as a permanent spinal cord injury with  paralysis.       9. Death:  Although rare, severe deadly allergic reactions known as "Anaphylactic  reaction" can occur to any of the medications used.      10. Worsening of the symptoms:  We can always make thing worse.  What are the chances of something like this happening? Chances of any of this occuring are extremely low.  By statistics, you have more of a chance of getting killed in a motor vehicle accident: while driving to the hospital than any of the above occurring .  Nevertheless, you should be aware that they are possibilities.  In general, it is similar to taking a shower.  Everybody knows that you can slip, hit your head and get killed.  Does that mean that you should not shower again?  Nevertheless always keep in mind that statistics do not mean anything if you happen to be on the wrong side of them.  Even if a procedure has a 1 (one) in a 1,000,000  (million) chance of going wrong, it you happen to be that one..Also, keep in mind that by statistics, you have more of a chance of having something go wrong when taking medications.  Who should not have this procedure? If you are on a blood thinning medication (e.g. Coumadin, Plavix, see list of "Blood Thinners"), or if you have an active infection going on, you should not have the procedure.  If you are taking any blood thinners, please inform your physician.  How should I prepare for this procedure?  Do not eat or drink anything at least six hours prior to the procedure.  Bring a driver with you .  It cannot be a taxi.  Come accompanied by an adult that can drive you back, and that is strong enough to help you if your legs get weak or numb from the local anesthetic.  Take all of your medicines the morning of the procedure with just enough water to swallow them.  If you have diabetes, make sure that you are scheduled to have your procedure done first thing in the morning, whenever possible.  If you have diabetes, take only half of your insulin dose and notify our nurse that you have done so as soon as you arrive at the clinic.  If you are diabetic, but only take blood sugar pills (oral hypoglycemic), then do not take them on the morning of your procedure.  You may take them after you have had the procedure.  Do not take aspirin or any aspirin-containing medications, at least eleven (11) days prior to the procedure.  They may prolong bleeding.  Wear loose fitting clothing that may be easy to take off and that you would not mind if it got stained with Betadine or blood.  Do not wear any jewelry or perfume  Remove any nail coloring.  It will interfere with some of our monitoring equipment.  NOTE: Remember that this is not meant to be interpreted as a complete list of all possible complications.  Unforeseen problems may occur.  BLOOD THINNERS The following drugs contain aspirin or other  products, which can cause increased bleeding during surgery and should not be taken for 2 weeks prior to and 1 week after surgery.  If you should need take something for relief of minor pain, you may take acetaminophen which is found in Tylenol,m Datril, Anacin-3 and Panadol. It is not blood thinner. The products listed below   are.  Do not take any of the products listed below in addition to any listed on your instruction sheet.  A.P.C or A.P.C with Codeine Codeine Phosphate Capsules #3 Ibuprofen Ridaura  ABC compound Congesprin Imuran rimadil  Advil Cope Indocin Robaxisal  Alka-Seltzer Effervescent Pain Reliever and Antacid Coricidin or Coricidin-D  Indomethacin Rufen  Alka-Seltzer plus Cold Medicine Cosprin Ketoprofen S-A-C Tablets  Anacin Analgesic Tablets or Capsules Coumadin Korlgesic Salflex  Anacin Extra Strength Analgesic tablets or capsules CP-2 Tablets Lanoril Salicylate  Anaprox Cuprimine Capsules Levenox Salocol  Anexsia-D Dalteparin Magan Salsalate  Anodynos Darvon compound Magnesium Salicylate Sine-off  Ansaid Dasin Capsules Magsal Sodium Salicylate  Anturane Depen Capsules Marnal Soma  APF Arthritis pain formula Dewitt's Pills Measurin Stanback  Argesic Dia-Gesic Meclofenamic Sulfinpyrazone  Arthritis Bayer Timed Release Aspirin Diclofenac Meclomen Sulindac  Arthritis pain formula Anacin Dicumarol Medipren Supac  Analgesic (Safety coated) Arthralgen Diffunasal Mefanamic Suprofen  Arthritis Strength Bufferin Dihydrocodeine Mepro Compound Suprol  Arthropan liquid Dopirydamole Methcarbomol with Aspirin Synalgos  ASA tablets/Enseals Disalcid Micrainin Tagament  Ascriptin Doan's Midol Talwin  Ascriptin A/D Dolene Mobidin Tanderil  Ascriptin Extra Strength Dolobid Moblgesic Ticlid  Ascriptin with Codeine Doloprin or Doloprin with Codeine Momentum Tolectin  Asperbuf Duoprin Mono-gesic Trendar  Aspergum Duradyne Motrin or Motrin IB Triminicin  Aspirin plain, buffered or enteric  coated Durasal Myochrisine Trigesic  Aspirin Suppositories Easprin Nalfon Trillsate  Aspirin with Codeine Ecotrin Regular or Extra Strength Naprosyn Uracel  Atromid-S Efficin Naproxen Ursinus  Auranofin Capsules Elmiron Neocylate Vanquish  Axotal Emagrin Norgesic Verin  Azathioprine Empirin or Empirin with Codeine Normiflo Vitamin E  Azolid Emprazil Nuprin Voltaren  Bayer Aspirin plain, buffered or children's or timed BC Tablets or powders Encaprin Orgaran Warfarin Sodium  Buff-a-Comp Enoxaparin Orudis Zorpin  Buff-a-Comp with Codeine Equegesic Os-Cal-Gesic   Buffaprin Excedrin plain, buffered or Extra Strength Oxalid   Bufferin Arthritis Strength Feldene Oxphenbutazone   Bufferin plain or Extra Strength Feldene Capsules Oxycodone with Aspirin   Bufferin with Codeine Fenoprofen Fenoprofen Pabalate or Pabalate-SF   Buffets II Flogesic Panagesic   Buffinol plain or Extra Strength Florinal or Florinal with Codeine Panwarfarin   Buf-Tabs Flurbiprofen Penicillamine   Butalbital Compound Four-way cold tablets Penicillin   Butazolidin Fragmin Pepto-Bismol   Carbenicillin Geminisyn Percodan   Carna Arthritis Reliever Geopen Persantine   Carprofen Gold's salt Persistin   Chloramphenicol Goody's Phenylbutazone   Chloromycetin Haltrain Piroxlcam   Clmetidine heparin Plaquenil   Cllnoril Hyco-pap Ponstel   Clofibrate Hydroxy chloroquine Propoxyphen         Before stopping any of these medications, be sure to consult the physician who ordered them.  Some, such as Coumadin (Warfarin) are ordered to prevent or treat serious conditions such as "deep thrombosis", "pumonary embolisms", and other heart problems.  The amount of time that you may need off of the medication may also vary with the medication and the reason for which you were taking it.  If you are taking any of these medications, please make sure you notify your pain physician before you undergo any procedures.          

## 2015-11-14 NOTE — Progress Notes (Signed)
Subjective:  Patient ID: Taylor [name], female    DOB: 16-Jan-1933  Age: 82 y.o. MRN: [id number]  CC: Shoulder Pain (right); Leg Pain (bilateral); and Hip Pain (bilateral )      PROCEDURE:None  HPI Taylor [name] presents for a new patient evaluation. She is a pleasant 82 year old black female with long-standing history of low back pain. She reports a previous fusion back in 2001 at L5-S1 and this was cooperated with previous x-rays of her low back. She's had no recent studies that reports a pain that starts in her low back with radiation into the right posterior and anterior leg. She has associated numbness and tingling that is worse with standing affecting the anterior knee and anterior lower leg right side. Furthermore she reports a VAS score of maximum 8 to 10 and averaging a 6 but  not influenced by time of day but aggravated by any type of motion or activity. Alleviating factors include walking resting warm showers or medication management. She has been on Percocet 7.5 mg tablets for an extended period of time and these are the only thing she reports giving her relief. The pain is described as a feeling of weight pressure sharp shooting in her low back with radiation into the right lower leg area she has associated tingling affecting the front of the leg and weakness that occurs with prolonged standing affecting the right greater than left lower leg.  She has had a history of some cervical pain as well affecting the right shoulder region and down the right arm into the right fifth finger however this is reportedly better at this time. This is no longer a current problem for other than some occasional muscle stiffness in the posterior right trapezius region. She denies any weakness in the upper extremities at this time  History Taylor [name] has a past medical history of Abnormal mammogram, unspecified (2013); Breast screening, unspecified (2013); Diabetes mellitus without complication ([place]) ([id number]);  GERD (gastroesophageal reflux disease); Glaucoma (2003); Gout; Hyperlipidemia (2008); Hypertension (1980's); Lump or mass in breast (01/03/2012); Obesity, unspecified (2013); Osteoporosis; Shingles (2013); and Special screening for malignant neoplasms, colon (2013).   She has a past surgical history that includes Abdominal hysterectomy (1958); Knee surgery (2009,2011,2012); Lipoma excision (1998); Spine surgery (2004); Tonsillectomy; Colonoscopy (2003); Coronary artery bypass graft (Left, 01/03/2012,01/24/2012); Cataract extraction, bilateral (Bilateral); Cholecystectomy; Back surgery; and Knee surgery.   Her family history includes Cancer in her mother; Heart disease in her father; Stroke in her mother.She reports that she has never smoked. She has never used smokeless tobacco. She reports that she does not drink alcohol or use drugs.  No results found for this or any previous visit.  No results found for: TOXASSSELUR  Outpatient Medications Prior to Visit  Medication Sig Dispense Refill  . amLODipine (NORVASC) 10 MG tablet Take 1 tablet (10 mg total) by mouth daily. 90 tablet 1  . bimatoprost (LUMIGAN) 0.01 % SOLN Place 1 drop into both eyes daily.     . brimonidine-timolol (COMBIGAN) 0.2-0.5 % ophthalmic solution Place 1 drop into both eyes See admin instructions. 1 drop into both eyes in the morning, then 1 drop into both eyes 8 hours later    . conjugated estrogens (PREMARIN) vaginal cream Place 1 Applicatorful vaginally daily. 42.5 g 5  . DUREZOL 0.05 % EMUL Place 1 drop into both eyes 4 (four) times daily.     . fluticasone (FLONASE) 50 MCG/ACT nasal spray Place 2 sprays into both nostrils daily.     [name]  glucose blood test strip Use as instructed 100 each 12  . metFORMIN (GLUCOPHAGE) 500 MG tablet Take 1 tablet (500 mg total) by mouth daily. 90 tablet 0  . omeprazole (PRILOSEC) 20 MG capsule Take 1 capsule (20 mg total) by mouth daily. 90 capsule 1  . ONETOUCH DELICA LANCETS FINE MISC USE  ONCE DAILY TO CHECK BLOOD SUGAR 100 each 0  . pravastatin (PRAVACHOL) 40 MG tablet Take 1 tablet (40 mg total) by mouth at bedtime. 90 tablet 0  . predniSONE (DELTASONE) 10 MG tablet Take 1 tablet (10 mg total) by mouth daily with breakfast. 6 tablet 0  . quinapril (ACCUPRIL) 40 MG tablet Take 1 tablet (40 mg total) by mouth daily. 90 tablet 1  . alendronate (FOSAMAX) 70 MG tablet TAKE 1 TABLET WEEKLY (Patient not taking: Reported on 11/14/2015) 4 tablet 11  . Cholecalciferol (VITAMIN D-1000 MAX ST) 1000 UNITS tablet Take 1,000 Units by mouth daily. Reported on 08/05/2015    . cyclobenzaprine (FLEXERIL) 5 MG tablet Take 1 tablet (5 mg total) by mouth 3 (three) times daily as needed for muscle spasms. (Patient not taking: Reported on 11/14/2015) 30 tablet 0  . oxyCODONE-acetaminophen (PERCOCET) 7.5-325 MG tablet Take 1 tablet by mouth 2 (two) times daily as needed for severe pain. (Patient not taking: Reported on 11/14/2015) 60 tablet 0  . PROLENSA 0.07 % SOLN Reported on 08/11/2015  1   No facility-administered medications prior to visit.    Lab Results  Component Value Date   GLUCOSE 160 (H) 08/09/2015   CHOL 127 08/09/2015   TRIG 105 08/09/2015   HDL 47 08/09/2015   LDLCALC 59 08/09/2015   ALT 6 08/09/2015   AST 9 08/09/2015   NA 141 08/09/2015   K 5.2 08/09/2015   CL 105 08/09/2015   CREATININE 1.38 (H) 08/09/2015   BUN 22 08/09/2015   CO2 22 08/09/2015   HGBA1C 7.3 11/10/2014    --------------------------------------------------------------------------------------------------------------------- Dg Chest 2 View  Result Date: 08/11/2015 CLINICAL DATA:  Acute onset of right-sided arm pain, radiating to the right shoulder. Initial encounter. EXAM: CHEST  2 VIEW COMPARISON:  Chest radiograph performed 06/11/2012 FINDINGS: The lungs are well-aerated and clear. There is no evidence of focal opacification, pleural effusion or pneumothorax. The heart is borderline enlarged. No acute osseous  abnormalities are seen. Clips are noted within the right upper quadrant, reflecting prior cholecystectomy. IMPRESSION: Borderline cardiomegaly, though the superior mediastinum is normal in appearance. The thoracic aorta is grossly unremarkable in appearance. No acute cardiopulmonary process seen. Electronically Signed   By: Garald Balding M.D.   On: 08/11/2015 23:19       ---------------------------------------------------------------------------------------------------------------------- Past Medical History:  Diagnosis Date  . Abnormal mammogram, unspecified 2013  . Breast screening, unspecified 2013  . Diabetes mellitus without complication ([place]) [id number]   non insulin dependent  . GERD (gastroesophageal reflux disease)   . Glaucoma 2003  . Gout   . Hyperlipidemia 2008  . Hypertension 1980's  . Lump or mass in breast 01/03/2012   left breast  . Obesity, unspecified 2013  . Osteoporosis   . Shingles 2013  . Special screening for malignant neoplasms, colon 2013    Past Surgical History:  Procedure Laterality Date  . ABDOMINAL HYSTERECTOMY  1958   menorrhagia  . BACK SURGERY    . CATARACT EXTRACTION, BILATERAL Bilateral    1st 06/07/15 and the 2nd 06/21/15  . CHOLECYSTECTOMY    . COLONOSCOPY  2003   [facility]  . CORONARY ARTERY  BYPASS GRAFT Left 01/03/2012,01/24/2012   Left FNA, Left breast [facility] bx  . KNEE SURGERY  2009,2011,2012   twice on right and once on left  . KNEE SURGERY    . LIPOMA EXCISION  1998  . [place]  2004  . TONSILLECTOMY     age of 59    Family History  Problem Relation Age of Onset  . Cancer Mother     skin cancer  . Stroke Mother   . Heart disease Father     Social History  Substance Use Topics  . Smoking status: Never Smoker  . Smokeless tobacco: Never Used  . Alcohol use No    ---------------------------------------------------------------------------------------------------------------------- Social History   Social History  .  Marital status: Divorced    Spouse name: N/A  . Number of children: N/A  . Years of education: N/A   Social History Main Topics  . Smoking status: Never Smoker  . Smokeless tobacco: Never Used  . Alcohol use No  . Drug use: No  . Sexual activity: No   Other Topics Concern  . None   Social History Narrative  . None    Scheduled Meds: Continuous Infusions: PRN Meds:.   BP (!) 159/77 (BP Location: Right Arm, Patient Position: Sitting, Cuff Size: Large)   Pulse 88   Temp 98.4 F (36.9 C) (Oral)   Resp 16   Ht 5\' 1"  (1.549 m)   Wt 206 lb (93.4 kg)   SpO2 100%   BMI 38.92 kg/m    BP Readings from Last 3 Encounters:  11/14/15 (!) 159/77  11/08/15 110/64  08/19/15 124/76     Wt Readings from Last 3 Encounters:  11/14/15 206 lb (93.4 kg)  11/08/15 206 lb 12.8 oz (93.8 kg)  08/19/15 210 lb 9.6 oz (95.5 kg)     ----------------------------------------------------------------------------------------------------------------------  ROS Review of Systems  Cardiac: Abnormal heart rhythm and high blood pressure with heart murmur Pulmonary: Bronchitis Neurologic: As above Psychological: Negative GI: Negative GU: Kidney stones and history of UTI Hematologic: Occasional easy bruising Endocrine: Diabetes Rheumatologic: Rheumatoid arthritis history Denies drug abuse  Objective:  BP (!) 159/77 (BP Location: Right Arm, Patient Position: Sitting, Cuff Size: Large)   Pulse 88   Temp 98.4 F (36.9 C) (Oral)   Resp 16   Ht 5\' 1"  (1.549 m)   Wt 206 lb (93.4 kg)   SpO2 100%   BMI 38.92 kg/m   Physical Exam she is a pleasant 82 year old black female in no acute distress alert and oriented in her wheelchair. Pupils are equally round reactive to light extraocular muscles intact Heart is regular rate and rhythm without murmur Lungs are clear to also dictation Action the back reveals a well-healed midline scar running from approximately T12-L1 region to the sacral  region. No cutaneous remarkable findings are present she has some mild tenderness to palpation bilaterally. Her strength throughout the lower extremities appears to be intact except for 4/5 strength to flexion at the right knee. Sensation appears to be grossly intact.     Assessment & Plan:   Taylor [name] was seen today for shoulder pain, leg pain and hip pain.  Diagnoses and all orders for this visit:  Failed back syndrome of lumbar spine -     Lumbar Epidural Injection; Future -     ToxASSURE Select 13 (MW), Urine  Sciatica associated with disorder of lumbar spine, right -     Lumbar Epidural Injection; Future  Low back derangement syndrome -  Lumbar Epidural Injection; Future     ----------------------------------------------------------------------------------------------------------------------  Problem List Items Addressed This Visit    None    Visit Diagnoses    Failed back syndrome of lumbar spine    -  Primary   Relevant Orders   Lumbar Epidural Injection   ToxASSURE Select 13 (MW), Urine   Sciatica associated with disorder of lumbar spine, right       Relevant Orders   Lumbar Epidural Injection   Low back derangement syndrome       Relevant Orders   Lumbar Epidural Injection      ----------------------------------------------------------------------------------------------------------------------  1. Failed back syndrome of lumbar spine We talked about the benefits of a possible caudal epidural injection at her next visit in one month. We have gone over the risks and benefits of this and she seems somewhat reticent to proceed with injection therapy. Furthermore we talked about benefits of continued opioid management. We talked about potential for addiction death from opioid overdose etc. These seem to be well managed for her at this time and she maintains that she's been compliant with the regimen. A urine tox screen is ordered today. Return to clinic in 1 month  and we may consider writing for her medications at that point. - Lumbar Epidural Injection; Future - ToxASSURE Select 13 (MW), Urine  2. Sciatica associated with disorder of lumbar spine, right As above as above - Lumbar Epidural Injection; Future  3. Low back derangement syndrome As above - Lumbar Epidural Injection; Future    ----------------------------------------------------------------------------------------------------------------------  I am having Taylor [name] maintain her bimatoprost, Cholecalciferol, fluticasone, glucose blood, conjugated estrogens, ONETOUCH DELICA LANCETS FINE, alendronate, DUREZOL, PROLENSA, quinapril, amLODipine, omeprazole, pravastatin, brimonidine-timolol, metFORMIN, oxyCODONE-acetaminophen, cyclobenzaprine, predniSONE, and fluticasone-salmeterol.   Meds ordered this encounter  Medications  . fluticasone-salmeterol (ADVAIR HFA) 115-21 MCG/ACT inhaler    Sig: Inhale 2 puffs into the lungs 2 (two) times daily.       Follow-up: Return in about 1 month (around 12/14/2015) for evaluation, procedure.    [name], MD  This dictation was performed utilizing Dragon voice recognition software.  Please excuse any unintentional or mistaken typographical errors as a result of its unedited utilization.

## 2015-11-14 NOTE — Progress Notes (Signed)
Safety precautions to be maintained throughout the outpatient stay will include: orient to surroundings, keep bed in low position, maintain call bell within reach at all times, provide assistance with transfer out of bed and ambulation.  New pt eval

## 2015-11-17 ENCOUNTER — Ambulatory Visit (INDEPENDENT_AMBULATORY_CARE_PROVIDER_SITE_OTHER): Payer: Medicare Other

## 2015-11-17 DIAGNOSIS — Z23 Encounter for immunization: Secondary | ICD-10-CM

## 2015-11-23 LAB — TOXASSURE SELECT 13 (MW), URINE

## 2015-11-24 ENCOUNTER — Other Ambulatory Visit: Payer: Self-pay | Admitting: Family Medicine

## 2015-11-24 DIAGNOSIS — E785 Hyperlipidemia, unspecified: Secondary | ICD-10-CM

## 2015-11-24 NOTE — Telephone Encounter (Signed)
Patient requesting refill of Pravastatin to [facility].  

## 2015-11-30 DIAGNOSIS — M62830 Muscle spasm of back: Secondary | ICD-10-CM | POA: Diagnosis not present

## 2015-11-30 DIAGNOSIS — S39012A Strain of muscle, fascia and tendon of lower back, initial encounter: Secondary | ICD-10-CM | POA: Diagnosis not present

## 2015-12-05 ENCOUNTER — Ambulatory Visit: Payer: Medicare Other | Admitting: Family Medicine

## 2015-12-10 ENCOUNTER — Other Ambulatory Visit: Payer: Self-pay | Admitting: Family Medicine

## 2015-12-12 ENCOUNTER — Ambulatory Visit (INDEPENDENT_AMBULATORY_CARE_PROVIDER_SITE_OTHER): Payer: Medicare Other | Admitting: Family Medicine

## 2015-12-12 ENCOUNTER — Encounter: Payer: Self-pay | Admitting: Family Medicine

## 2015-12-12 VITALS — BP 134/76 | HR 90 | Temp 97.7°F | Resp 16 | Ht 61.0 in | Wt 207.7 lb

## 2015-12-12 DIAGNOSIS — E7800 Pure hypercholesterolemia, unspecified: Secondary | ICD-10-CM | POA: Diagnosis not present

## 2015-12-12 DIAGNOSIS — L600 Ingrowing nail: Secondary | ICD-10-CM | POA: Diagnosis not present

## 2015-12-12 DIAGNOSIS — K219 Gastro-esophageal reflux disease without esophagitis: Secondary | ICD-10-CM

## 2015-12-12 DIAGNOSIS — E1165 Type 2 diabetes mellitus with hyperglycemia: Secondary | ICD-10-CM

## 2015-12-12 DIAGNOSIS — M150 Primary generalized (osteo)arthritis: Secondary | ICD-10-CM

## 2015-12-12 DIAGNOSIS — E1121 Type 2 diabetes mellitus with diabetic nephropathy: Secondary | ICD-10-CM

## 2015-12-12 DIAGNOSIS — IMO0002 Reserved for concepts with insufficient information to code with codable children: Secondary | ICD-10-CM

## 2015-12-12 DIAGNOSIS — I10 Essential (primary) hypertension: Secondary | ICD-10-CM

## 2015-12-12 DIAGNOSIS — M8949 Other hypertrophic osteoarthropathy, multiple sites: Secondary | ICD-10-CM

## 2015-12-12 DIAGNOSIS — M159 Polyosteoarthritis, unspecified: Secondary | ICD-10-CM

## 2015-12-12 LAB — POCT GLYCOSYLATED HEMOGLOBIN (HGB A1C): Hemoglobin A1C: 8.2

## 2015-12-12 LAB — POCT UA - MICROALBUMIN: Microalbumin Ur, POC: 100 mg/L

## 2015-12-12 MED ORDER — AMLODIPINE BESYLATE 10 MG PO TABS: 10.0000 mg | ORAL_TABLET | Freq: Every day | ORAL | 1 refills | Status: DC

## 2015-12-12 MED ORDER — METFORMIN HCL 500 MG PO TABS: 500.0000 mg | ORAL_TABLET | Freq: Every day | ORAL | 1 refills | Status: DC

## 2015-12-12 MED ORDER — PRAVASTATIN SODIUM 40 MG PO TABS: 40.0000 mg | ORAL_TABLET | Freq: Every day | ORAL | 1 refills | Status: DC

## 2015-12-12 MED ORDER — QUINAPRIL HCL 40 MG PO TABS
40.0000 mg | ORAL_TABLET | Freq: Every day | ORAL | 1 refills | Status: DC
Start: 2015-12-12 — End: 2016-04-16

## 2015-12-12 MED ORDER — METFORMIN HCL 500 MG PO TABS: 500.0000 mg | ORAL_TABLET | Freq: Two times a day (BID) | ORAL | 1 refills | Status: DC

## 2015-12-12 MED ORDER — ACETAMINOPHEN 500 MG PO TABS: 500.0000 mg | ORAL_TABLET | Freq: Three times a day (TID) | ORAL | 0 refills | Status: DC

## 2015-12-12 MED ORDER — RANITIDINE HCL 150 MG PO TABS: 150.0000 mg | ORAL_TABLET | Freq: Two times a day (BID) | ORAL | 0 refills | Status: DC

## 2015-12-12 NOTE — Patient Instructions (Signed)
Food Choices for Gastroesophageal Reflux Disease, Adult When you have gastroesophageal reflux disease (GERD), the foods you eat and your eating habits are very important. Choosing the right foods can help ease the discomfort of GERD. WHAT GENERAL GUIDELINES DO I NEED TO FOLLOW?  Choose fruits, vegetables, whole grains, low-fat dairy products, and low-fat meat, fish, and poultry.  Limit fats such as oils, salad dressings, butter, nuts, and avocado.  Keep a food diary to identify foods that cause symptoms.  Avoid foods that cause reflux. These may be different for different people.  Eat frequent small meals instead of three large meals each day.  Eat your meals slowly, in a relaxed setting.  Limit fried foods.  Cook foods using methods other than frying.  Avoid drinking alcohol.  Avoid drinking large amounts of liquids with your meals.  Avoid bending over or lying down until 2-3 hours after eating. WHAT FOODS ARE NOT RECOMMENDED? The following are some foods and drinks that may worsen your symptoms: Vegetables Tomatoes. Tomato juice. Tomato and spaghetti sauce. Chili peppers. Onion and garlic. Horseradish. Fruits Oranges, grapefruit, and lemon (fruit and juice). Meats High-fat meats, fish, and poultry. This includes hot dogs, ribs, ham, sausage, salami, and bacon. Dairy Whole milk and chocolate milk. Sour cream. Cream. Butter. Ice cream. Cream cheese.  Beverages Coffee and tea, with or without caffeine. Carbonated beverages or energy drinks. Condiments Hot sauce. Barbecue sauce.  Sweets/Desserts Chocolate and cocoa. Donuts. Peppermint and spearmint. Fats and Oils High-fat foods, including French fries and potato chips. Other Vinegar. Strong spices, such as black pepper, white pepper, red pepper, cayenne, curry powder, cloves, ginger, and chili powder. The items listed above may not be a complete list of foods and beverages to avoid. Contact your dietitian for more  information.   This information is not intended to replace advice given to you by your health care provider. Make sure you discuss any questions you have with your health care provider.   Document Released: 02/05/2005 Document Revised: 02/26/2014 Document Reviewed: 12/10/2012 Elsevier Interactive Patient Education 2016 [employer]  

## 2015-12-12 NOTE — Progress Notes (Signed)
Name: Taylor [name]   MRN: [id number]    DOB: [birth year]   Date:12/12/2015       Progress Note  Subjective  Chief Complaint  Chief Complaint  Patient presents with  . Follow-up    1 month F/U  . Diabetes    Checks BS once daily Low-121 Average-150 Highest-180. Patient states she kept a check on her sugar while taking Prednisone.     HPI  HTN: bp is at goal today, she states she forgets to take medication sometimes and that is when it runs high. She states she has burning from GERD, but no tightness, no SOB.  Hyperlipidemia: she is taking pravastatin, no decrease in exercise tolerance or SOB  DM: glucose in our office in the 150 range, low of 121 and high of 180.  She denies polyphagia, polydipsia or polyuria. Eye exam is up to date. She has microalbuminuria and is taking ACE  GERD: she has been taking Omeprazole daily , she states symptoms of heartburn are worse at night, she has a hospital bed, and she has not been raising to 30 degrees. Discussed life style modification and long term risk associated with PPI, she is willing to try switching to Ranitidine  OA: she has intermittent low back pain, also daily right knee pain, and some other joint aches, explained the risk of narcotics, we will give her Tylenol, she goes to pain clinic, Dr. Andree Elk  Patient Active Problem List   Diagnosis Date Noted  . Hyperlipidemia 08/05/2015  . Vaginal dryness 02/22/2015  . Calculus of kidney 11/10/2014  . Osteoarthritis, multiple sites 09/09/2014  . Diabetes mellitus, type 2 ([place]) 09/09/2014  . Glaucoma 09/09/2014  . BP (high blood pressure) 09/09/2014  . Chronic pain 09/09/2014  . Tinea corporis 09/09/2014  . Umbilical hernia [id number]  . Lump or mass in breast     Past Surgical History:  Procedure Laterality Date  . ABDOMINAL HYSTERECTOMY  1958   menorrhagia  . BACK SURGERY    . CATARACT EXTRACTION, BILATERAL Bilateral    1st 06/07/15 and the 2nd 06/21/15  . CHOLECYSTECTOMY    .  COLONOSCOPY  2003   [facility]  . CORONARY ARTERY BYPASS GRAFT Left 01/03/2012,01/24/2012   Left FNA, Left breast [facility] bx  . KNEE SURGERY  2009,2011,2012   twice on right and once on left  . KNEE SURGERY    . LIPOMA EXCISION  1998  . [place]  2004  . TONSILLECTOMY     age of 60    Family History  Problem Relation Age of Onset  . Cancer Mother     skin cancer  . Stroke Mother   . Heart disease Father     Social History   Social History  . Marital status: Divorced    Spouse name: N/A  . Number of children: N/A  . Years of education: N/A   Occupational History  . Not on file.   Social History Main Topics  . Smoking status: Never Smoker  . Smokeless tobacco: Never Used  . Alcohol use No  . Drug use: No  . Sexual activity: No   Other Topics Concern  . Not on file   Social History Narrative  . No narrative on file     Current Outpatient Prescriptions:  .  alendronate (FOSAMAX) 70 MG tablet, TAKE 1 TABLET WEEKLY, Disp: 4 tablet, Rfl: 11 .  amLODipine (NORVASC) 10 MG tablet, Take 1 tablet (10 mg total) by mouth daily., Disp: 90  tablet, Rfl: 1 .  bimatoprost (LUMIGAN) 0.01 % SOLN, Place 1 drop into both eyes daily. , Disp: , Rfl:  .  brimonidine-timolol (COMBIGAN) 0.2-0.5 % ophthalmic solution, Place 1 drop into both eyes See admin instructions. 1 drop into both eyes in the morning, then 1 drop into both eyes 8 hours later, Disp: , Rfl:  .  conjugated estrogens (PREMARIN) vaginal cream, Place 1 Applicatorful vaginally daily., Disp: 42.5 g, Rfl: 5 .  fluticasone (FLONASE) 50 MCG/ACT nasal spray, Place 2 sprays into both nostrils daily. , Disp: , Rfl:  .  glucose blood test strip, Use as instructed, Disp: 100 each, Rfl: 12 .  metFORMIN (GLUCOPHAGE) 500 MG tablet, Take 1 tablet (500 mg total) by mouth 2 (two) times daily with a meal., Disp: 180 tablet, Rfl: 1 .  omeprazole (PRILOSEC) 20 MG capsule, Take 1 capsule (20 mg total) by mouth daily., Disp: 90 capsule, Rfl: 1 .   ONETOUCH DELICA LANCETS FINE MISC, USE ONCE DAILY TO CHECK BLOOD SUGAR, Disp: 100 each, Rfl: 0 .  pravastatin (PRAVACHOL) 40 MG tablet, Take 1 tablet (40 mg total) by mouth at bedtime., Disp: 90 tablet, Rfl: 1 .  PROLENSA 0.07 % SOLN, Reported on 08/11/2015, Disp: , Rfl: 1 .  quinapril (ACCUPRIL) 40 MG tablet, Take 1 tablet (40 mg total) by mouth daily., Disp: 90 tablet, Rfl: 1 .  acetaminophen (TYLENOL) 500 MG tablet, Take 1 tablet (500 mg total) by mouth 3 (three) times daily., Disp: 90 tablet, Rfl: 0 .  Cholecalciferol (VITAMIN D-1000 MAX ST) 1000 UNITS tablet, Take 1,000 Units by mouth daily. Reported on 08/05/2015, Disp: , Rfl:  .  DUREZOL 0.05 % EMUL, Place 1 drop into both eyes 4 (four) times daily. , Disp: , Rfl:  .  ranitidine (ZANTAC) 150 MG tablet, Take 1 tablet (150 mg total) by mouth 2 (two) times daily. Try to take this in place of Omeprazole, Disp: 180 tablet, Rfl: 0  No Known Allergies   ROS  Constitutional: Negative for fever or weight change.  Respiratory: Negative for cough and shortness of breath.   Cardiovascular: Negative for chest pain or palpitations.  Gastrointestinal: Negative for abdominal pain, no bowel changes.  Musculoskeletal:Positive for gait problem ( uses a cane ) or joint swelling.  Skin: Negative for rash.  Neurological: Negative for dizziness or headache.  No other specific complaints in a complete review of systems (except as listed in HPI above).  Objective  Vitals:   12/12/15 1144  BP: 134/76  Pulse: 90  Resp: 16  Temp: 97.7 F (36.5 C)  TempSrc: Oral  SpO2: 96%  Weight: 207 lb 11.2 oz (94.2 kg)  Height: 5\' 1"  (1.549 m)    Body mass index is 39.24 kg/m.  Physical Exam  Constitutional: Patient appears well-developed and well-nourished. Obese  No distress.  HEENT: head atraumatic, normocephalic, pupils equal and reactive to light, neck supple, throat within normal limits Cardiovascular: Normal rate, regular rhythm and normal heart  sounds.  No murmur heard. No BLE edema. Pulmonary/Chest: Effort normal and breath sounds normal. No respiratory distress. Abdominal: Soft.  There is no tenderness. Psychiatric: Patient has a normal mood and affect. behavior is normal. Judgment and thought content normal. Muscular skeletal: uses cane Skin: right ingrown toenail, tender but no signs of infection   Recent Results (from the past 2160 hour(s))  ToxASSURE Select 13 (MW), Urine     Status: None   Collection Time: 11/14/15  4:06 PM  Result Value Ref Range  ToxAssure Select 13 FINAL     Comment: ==================================================================== TOXASSURE SELECT 13 (MW) ==================================================================== Test                             Result       Flag       Units Drug Absent but Declared for Prescription Verification   Oxycodone                      Not Detected UNEXPECTED ng/mg creat ==================================================================== Test                      Result    Flag   Units      Ref Range   Creatinine              92               mg/dL      >=20 ==================================================================== Declared Medications:  The flagging and interpretation on this report are based on the  following declared medications.  Unexpected results may arise from  inaccuracies in the declared medications.  **Note: The testing scope of this panel includes these medications:  Oxycodone (Oxycodone Acetaminophen)  **Note: The testing scope of this panel does not include following   reported medications:  Acetaminophen (Oxycodone Acetaminophen)  Alendronate (Fosamax)  Amlodipine (Norvasc)  Bimatoprost (Lumigan)  Bromfenac (Prolensa)  Cholecalciferol  Cyclobenzaprine (Flexeril)  Estrogen (Premarin)  Eye Drop  Fluticasone (Advair)  Fluticasone (Flonase)  Metformin  Omeprazole  Pravastatin  Prednisone  Quinapril (Accupril)  Salmeterol  (Advair) ==================================================================== For clinical consultation, please call [phone]. ====================================================================   POCT glycosylated hemoglobin (Hb A1C)     Status: Abnormal   Collection Time: 12/12/15 12:18 PM  Result Value Ref Range   Hemoglobin A1C 8.2   POCT UA - Microalbumin     Status: Abnormal   Collection Time: 12/12/15 12:21 PM  Result Value Ref Range   Microalbumin Ur, POC 100 mg/L   Creatinine, POC  mg/dL   Albumin/Creatinine Ratio, Urine, POC        PHQ2/9: Depression screen [facility] 2/9 11/14/2015 11/08/2015 08/05/2015 02/22/2015 11/10/2014  Decreased Interest 0 0 0 0 0  Down, Depressed, Hopeless 0 0 0 0 0  PHQ - 2 Score 0 0 0 0 0     Fall Risk: Fall Risk  11/14/2015 11/08/2015 08/05/2015 02/22/2015 11/10/2014  Falls in the past year? No No Yes No No  Number falls in past yr: - - 1 - -  Injury with Fall? - - No - -    Assessment & Plan  1. Uncontrolled type 2 diabetes mellitus with diabetic nephropathy, without long-term current use of insulin (IXL)  She had a round of prednisone and glucose was high, however advised to increase Metformin to twice daily , taking Losartan  - POCT glycosylated hemoglobin (Hb A1C) - metFORMIN (GLUCOPHAGE) 500 MG tablet; Take 1 tablet (500 mg total) by mouth twice  daily.  Dispense: 180 tablet; Refill: 1  2. Essential hypertension  - quinapril (ACCUPRIL) 40 MG tablet; Take 1 tablet (40 mg total) by mouth daily.  Dispense: 90 tablet; Refill: 1 - amLODipine (NORVASC) 10 MG tablet; Take 1 tablet (10 mg total) by mouth daily.  Dispense: 90 tablet; Refill: 1  3. Gastroesophageal reflux disease without esophagitis  - ranitidine (ZANTAC) 150 MG tablet; Take 1 tablet (150 mg total) by mouth 2 (two) times daily.  Try to take this in place of Omeprazole  Dispense: 180 tablet; Refill: 0  4. Pure hypercholesterolemia  - pravastatin (PRAVACHOL) 40 MG tablet; Take 1  tablet (40 mg total) by mouth at bedtime.  Dispense: 90 tablet; Refill: 1  5. Primary osteoarthritis involving multiple joints  Advised Tylenol to control pain  6. Ingrown right big toenail  Going on for a long time, painful right medial aspect - Ambulatory referral to Podiatry

## 2015-12-12 NOTE — Telephone Encounter (Signed)
Patient requesting refill of Metformin to [facility]. 

## 2015-12-14 DIAGNOSIS — H2013 Chronic iridocyclitis, bilateral: Secondary | ICD-10-CM | POA: Diagnosis not present

## 2015-12-15 ENCOUNTER — Ambulatory Visit: Payer: Medicare Other | Attending: Anesthesiology | Admitting: Anesthesiology

## 2015-12-15 ENCOUNTER — Encounter: Payer: Self-pay | Admitting: Anesthesiology

## 2015-12-15 VITALS — BP 167/66 | HR 85 | Temp 97.9°F | Resp 18 | Ht 61.0 in | Wt 207.0 lb

## 2015-12-15 DIAGNOSIS — Z823 Family history of stroke: Secondary | ICD-10-CM | POA: Insufficient documentation

## 2015-12-15 DIAGNOSIS — M109 Gout, unspecified: Secondary | ICD-10-CM | POA: Insufficient documentation

## 2015-12-15 DIAGNOSIS — Z8619 Personal history of other infectious and parasitic diseases: Secondary | ICD-10-CM | POA: Diagnosis not present

## 2015-12-15 DIAGNOSIS — Z951 Presence of aortocoronary bypass graft: Secondary | ICD-10-CM | POA: Diagnosis not present

## 2015-12-15 DIAGNOSIS — Z6839 Body mass index (BMI) 39.0-39.9, adult: Secondary | ICD-10-CM | POA: Diagnosis not present

## 2015-12-15 DIAGNOSIS — Z9841 Cataract extraction status, right eye: Secondary | ICD-10-CM | POA: Insufficient documentation

## 2015-12-15 DIAGNOSIS — K219 Gastro-esophageal reflux disease without esophagitis: Secondary | ICD-10-CM | POA: Diagnosis not present

## 2015-12-15 DIAGNOSIS — M545 Low back pain: Secondary | ICD-10-CM | POA: Insufficient documentation

## 2015-12-15 DIAGNOSIS — Z9842 Cataract extraction status, left eye: Secondary | ICD-10-CM | POA: Insufficient documentation

## 2015-12-15 DIAGNOSIS — M25552 Pain in left hip: Secondary | ICD-10-CM | POA: Diagnosis not present

## 2015-12-15 DIAGNOSIS — M79604 Pain in right leg: Secondary | ICD-10-CM | POA: Diagnosis not present

## 2015-12-15 DIAGNOSIS — M961 Postlaminectomy syndrome, not elsewhere classified: Secondary | ICD-10-CM

## 2015-12-15 DIAGNOSIS — H409 Unspecified glaucoma: Secondary | ICD-10-CM | POA: Diagnosis not present

## 2015-12-15 DIAGNOSIS — G8929 Other chronic pain: Secondary | ICD-10-CM | POA: Diagnosis not present

## 2015-12-15 DIAGNOSIS — I10 Essential (primary) hypertension: Secondary | ICD-10-CM | POA: Diagnosis not present

## 2015-12-15 DIAGNOSIS — M542 Cervicalgia: Secondary | ICD-10-CM | POA: Insufficient documentation

## 2015-12-15 DIAGNOSIS — M5386 Other specified dorsopathies, lumbar region: Secondary | ICD-10-CM

## 2015-12-15 DIAGNOSIS — E785 Hyperlipidemia, unspecified: Secondary | ICD-10-CM | POA: Insufficient documentation

## 2015-12-15 DIAGNOSIS — Z981 Arthrodesis status: Secondary | ICD-10-CM | POA: Diagnosis not present

## 2015-12-15 DIAGNOSIS — Z809 Family history of malignant neoplasm, unspecified: Secondary | ICD-10-CM | POA: Diagnosis not present

## 2015-12-15 DIAGNOSIS — Z90710 Acquired absence of both cervix and uterus: Secondary | ICD-10-CM | POA: Insufficient documentation

## 2015-12-15 DIAGNOSIS — E119 Type 2 diabetes mellitus without complications: Secondary | ICD-10-CM | POA: Insufficient documentation

## 2015-12-15 DIAGNOSIS — E669 Obesity, unspecified: Secondary | ICD-10-CM | POA: Insufficient documentation

## 2015-12-15 DIAGNOSIS — Z7984 Long term (current) use of oral hypoglycemic drugs: Secondary | ICD-10-CM | POA: Diagnosis not present

## 2015-12-15 DIAGNOSIS — Z8249 Family history of ischemic heart disease and other diseases of the circulatory system: Secondary | ICD-10-CM | POA: Diagnosis not present

## 2015-12-15 DIAGNOSIS — Z9049 Acquired absence of other specified parts of digestive tract: Secondary | ICD-10-CM | POA: Insufficient documentation

## 2015-12-15 MED ORDER — OXYCODONE-ACETAMINOPHEN 7.5-325 MG PO TABS: 1.0000 | ORAL_TABLET | Freq: Three times a day (TID) | ORAL | 0 refills | Status: DC

## 2015-12-15 NOTE — Progress Notes (Signed)
Subjective:  Patient ID: Taylor [name], female    DOB: [birth year]  Age: 82 y.o. MRN: [id number]  CC: Hip Pain (left) and Leg Pain (right lower)   Service Provided on Last Visit: Evaluation  PROCEDURE:None  HPI Taylor [name] presents for a Repeat evaluation today. She is a pleasant 82 year old black female with long-standing history of low back pain. She reports a previous fusion back in 2001 at L5-S1 and this was cooperated with previous x-rays of her low back. She's had no recent studies that reports a pain that starts in her low back with radiation into the right posterior and anterior leg. She has associated numbness and tingling that is worse with standing affecting the anterior knee and anterior lower leg right side. Furthermore she reports a VAS score of maximum 8 to 10 and averaging a 6 but  not influenced by time of day but aggravated by any type of motion or activity. Alleviating factors include walking resting warm showers or medication management. She has been on Percocet 7.5 mg tablets for an extended period of time and these are the only thing she reports giving her relief. No changes in the quality of her low back pain and lower extremity pain are noted her urine tox screen was negative and she is requesting initiation of opioid therapy today.   She has had a history of some cervical pain as well affecting the right shoulder region and down the right arm into the right fifth finger however this is reportedly better at this time. This is no longer a current problem for other than some occasional muscle stiffness in the posterior right trapezius region. She denies any weakness in the upper extremities at this time  History Taylor [name] has a past medical history of Abnormal mammogram, unspecified (2013); Breast screening, unspecified (2013); Diabetes mellitus without complication ([place]) ([id number]); GERD (gastroesophageal reflux disease); Glaucoma (2003); Gout; Hyperlipidemia (2008); Hypertension  (1980's); Lump or mass in breast (01/03/2012); Obesity, unspecified (2013); Osteoporosis; Shingles (2013); and Special screening for malignant neoplasms, colon (2013).   She has a past surgical history that includes Abdominal hysterectomy (1958); Knee surgery (2009,2011,2012); Lipoma excision (1998); Spine surgery (2004); Tonsillectomy; Colonoscopy (2003); Coronary artery bypass graft (Left, 01/03/2012,01/24/2012); Cataract extraction, bilateral (Bilateral); Cholecystectomy; Back surgery; and Knee surgery.   Her family history includes Cancer in her mother; Heart disease in her father; Stroke in her mother.She reports that she has never smoked. She has never used smokeless tobacco. She reports that she does not drink alcohol or use drugs.  No results found for this or any previous visit.  ToxAssure Select 13  Date Value Ref Range Status  11/14/2015 FINAL  Final    Comment:    ==================================================================== TOXASSURE SELECT 13 (MW) ==================================================================== Test                             Result       Flag       Units Drug Absent but Declared for Prescription Verification   Oxycodone                      Not Detected UNEXPECTED ng/mg creat ==================================================================== Test                      Result    Flag   Units      Ref Range   Creatinine  92               mg/dL      >=20 ==================================================================== Declared Medications:  The flagging and interpretation on this report are based on the  following declared medications.  Unexpected results may arise from  inaccuracies in the declared medications.  **Note: The testing scope of this panel includes these medications:  Oxycodone (Oxycodone Acetaminophen)  **Note: The testing scope of this panel does not include following  reported medications:  Acetaminophen  (Oxycodone Acetaminophen)  Alendronate (Fosamax)  Amlodipine (Norvasc)  Bimatoprost (Lumigan)  Bromfenac (Prolensa)  Cholecalciferol  Cyclobenzaprine (Flexeril)  Estrogen (Premarin)  Eye Drop  Fluticasone (Advair)  Fluticasone (Flonase)  Metformin  Omeprazole  Pravastatin  Prednisone  Quinapril (Accupril)  Salmeterol (Advair) ==================================================================== For clinical consultation, please call [phone]. ====================================================================     Outpatient Medications Prior to Visit  Medication Sig Dispense Refill  . acetaminophen (TYLENOL) 500 MG tablet Take 1 tablet (500 mg total) by mouth 3 (three) times daily. 90 tablet 0  . alendronate (FOSAMAX) 70 MG tablet TAKE 1 TABLET WEEKLY 4 tablet 11  . amLODipine (NORVASC) 10 MG tablet Take 1 tablet (10 mg total) by mouth daily. 90 tablet 1  . bimatoprost (LUMIGAN) 0.01 % SOLN Place 1 drop into both eyes daily.     . brimonidine-timolol (COMBIGAN) 0.2-0.5 % ophthalmic solution Place 1 drop into both eyes See admin instructions. 1 drop into both eyes in the morning, then 1 drop into both eyes 8 hours later    . Cholecalciferol (VITAMIN D-1000 MAX ST) 1000 UNITS tablet Take 1,000 Units by mouth daily. Reported on 08/05/2015    . conjugated estrogens (PREMARIN) vaginal cream Place 1 Applicatorful vaginally daily. 42.5 g 5  . DUREZOL 0.05 % EMUL Place 1 drop into both eyes 4 (four) times daily.     . fluticasone (FLONASE) 50 MCG/ACT nasal spray Place 2 sprays into both nostrils daily.     [name] glucose blood test strip Use as instructed 100 each 12  . metFORMIN (GLUCOPHAGE) 500 MG tablet Take 1 tablet (500 mg total) by mouth 2 (two) times daily with a meal. 180 tablet 1  . omeprazole (PRILOSEC) 20 MG capsule Take 1 capsule (20 mg total) by mouth daily. 90 capsule 1  . ONETOUCH DELICA LANCETS FINE MISC USE ONCE DAILY TO CHECK BLOOD SUGAR 100 each 0  . pravastatin  (PRAVACHOL) 40 MG tablet Take 1 tablet (40 mg total) by mouth at bedtime. 90 tablet 1  . PROLENSA 0.07 % SOLN Reported on 08/11/2015  1  . quinapril (ACCUPRIL) 40 MG tablet Take 1 tablet (40 mg total) by mouth daily. 90 tablet 1  . ranitidine (ZANTAC) 150 MG tablet Take 1 tablet (150 mg total) by mouth 2 (two) times daily. Try to take this in place of Omeprazole 180 tablet 0   No facility-administered medications prior to visit.    Lab Results  Component Value Date   GLUCOSE 160 (H) 08/09/2015   CHOL 127 08/09/2015   TRIG 105 08/09/2015   HDL 47 08/09/2015   LDLCALC 59 08/09/2015   ALT 6 08/09/2015   AST 9 08/09/2015   NA 141 08/09/2015   K 5.2 08/09/2015   CL 105 08/09/2015   CREATININE 1.38 (H) 08/09/2015   BUN 22 08/09/2015   CO2 22 08/09/2015   HGBA1C 8.2 12/12/2015   MICROALBUR 100 12/12/2015    --------------------------------------------------------------------------------------------------------------------- Dg Chest 2 View  Result Date: 08/11/2015 CLINICAL DATA:  Acute onset  of right-sided arm pain, radiating to the right shoulder. Initial encounter. EXAM: CHEST  2 VIEW COMPARISON:  Chest radiograph performed 06/11/2012 FINDINGS: The lungs are well-aerated and clear. There is no evidence of focal opacification, pleural effusion or pneumothorax. The heart is borderline enlarged. No acute osseous abnormalities are seen. Clips are noted within the right upper quadrant, reflecting prior cholecystectomy. IMPRESSION: Borderline cardiomegaly, though the superior mediastinum is normal in appearance. The thoracic aorta is grossly unremarkable in appearance. No acute cardiopulmonary process seen. Electronically Signed   By: Garald Balding M.D.   On: 08/11/2015 23:19       ---------------------------------------------------------------------------------------------------------------------- Past Medical History:  Diagnosis Date  . Abnormal mammogram, unspecified 2013  . Breast  screening, unspecified 2013  . Diabetes mellitus without complication ([place]) [id number]   non insulin dependent  . GERD (gastroesophageal reflux disease)   . Glaucoma 2003  . Gout   . Hyperlipidemia 2008  . Hypertension 1980's  . Lump or mass in breast 01/03/2012   left breast  . Obesity, unspecified 2013  . Osteoporosis   . Shingles 2013  . Special screening for malignant neoplasms, colon 2013    Past Surgical History:  Procedure Laterality Date  . ABDOMINAL HYSTERECTOMY  1958   menorrhagia  . BACK SURGERY    . CATARACT EXTRACTION, BILATERAL Bilateral    1st 06/07/15 and the 2nd 06/21/15  . CHOLECYSTECTOMY    . COLONOSCOPY  2003   [facility]  . CORONARY ARTERY BYPASS GRAFT Left 01/03/2012,01/24/2012   Left FNA, Left breast [facility] bx  . KNEE SURGERY  2009,2011,2012   twice on right and once on left  . KNEE SURGERY    . LIPOMA EXCISION  1998  . [place]  2004  . TONSILLECTOMY     age of 84    Family History  Problem Relation Age of Onset  . Cancer Mother     skin cancer  . Stroke Mother   . Heart disease Father     Social History  Substance Use Topics  . Smoking status: Never Smoker  . Smokeless tobacco: Never Used  . Alcohol use No    ---------------------------------------------------------------------------------------------------------------------- Social History   Social History  . Marital status: Divorced    Spouse name: N/A  . Number of children: N/A  . Years of education: N/A   Social History Main Topics  . Smoking status: Never Smoker  . Smokeless tobacco: Never Used  . Alcohol use No  . Drug use: No  . Sexual activity: No   Other Topics Concern  . None   Social History Narrative  . None    Scheduled Meds: Continuous Infusions: PRN Meds:.   BP (!) 167/66   Pulse 85   Temp 97.9 F (36.6 C) (Oral)   Resp 18   Ht 5\' 1"  (1.549 m)   Wt 207 lb (93.9 kg)   SpO2 100%   BMI 39.11 kg/m    BP Readings from Last 3 Encounters:  12/15/15 (!)  167/66  12/12/15 134/76  11/14/15 (!) 159/77     Wt Readings from Last 3 Encounters:  12/15/15 207 lb (93.9 kg)  12/12/15 207 lb 11.2 oz (94.2 kg)  11/14/15 206 lb (93.4 kg)     ----------------------------------------------------------------------------------------------------------------------  ROS Review of Systems    Objective:  BP (!) 167/66   Pulse 85   Temp 97.9 F (36.6 C) (Oral)   Resp 18   Ht 5\' 1"  (1.549 m)   Wt 207 lb (93.9 kg)  SpO2 100%   BMI 39.11 kg/m   Physical Exam she is a pleasant 82 year old black female in no acute distress alert and oriented in her wheelchair. Pupils are equally round reactive to light extraocular muscles intact Heart is regular rate and rhythm without murmur Lungs are clear to also dictation Action the back reveals a well-healed midline scar running from approximately T12-L1 region to the sacral region. No cutaneous remarkable findings are present she has some mild tenderness to palpation bilaterally. Her strength throughout the lower extremities appears to be intact except for 4/5 strength to flexion at the right knee. Sensation appears to be grossly intact. This is without change from previous evaluation.     Assessment & Plan:   Taylor [name] was seen today for hip pain and leg pain.  Diagnoses and all orders for this visit:  Failed back syndrome of lumbar spine  Low back derangement syndrome  Other orders -     oxyCODONE-acetaminophen (PERCOCET) 7.5-325 MG tablet; Take 1 tablet by mouth 3 (three) times daily.     ----------------------------------------------------------------------------------------------------------------------  Problem List Items Addressed This Visit    None    Visit Diagnoses    Failed back syndrome of lumbar spine    -  Primary   Low back derangement syndrome       Relevant Medications   cyclobenzaprine (FLEXERIL) 5 MG tablet   oxyCODONE-acetaminophen (PERCOCET) 7.5-325 MG tablet       ----------------------------------------------------------------------------------------------------------------------  1. Failed back syndrome of lumbar spine We talked about the benefits of a possible caudal epidural injection at a future visit. She is reticent to proceed with any injection therapy. She desires to proceed with reinitiation of oxycodone therapy. This has worked well for her in the past at the 7.5 mg dose 3 times a day. A prescription was given for today with return to clinic in 1 month 2. Sciatica associated with disorder of lumbar spine, right As above as above - Lumbar Epidural Injection; Future  3. Low back derangement syndrome As above - Lumbar Epidural Injection; Future    ----------------------------------------------------------------------------------------------------------------------  I am having Taylor [name] start on oxyCODONE-acetaminophen. I am also having her maintain her bimatoprost, Cholecalciferol, fluticasone, glucose blood, conjugated estrogens, ONETOUCH DELICA LANCETS FINE, alendronate, DUREZOL, PROLENSA, omeprazole, brimonidine-timolol, ranitidine, acetaminophen, pravastatin, quinapril, amLODipine, metFORMIN, and cyclobenzaprine.   Meds ordered this encounter  Medications  . cyclobenzaprine (FLEXERIL) 5 MG tablet    Sig: Take 5 mg by mouth 3 (three) times daily.  [name] oxyCODONE-acetaminophen (PERCOCET) 7.5-325 MG tablet    Sig: Take 1 tablet by mouth 3 (three) times daily.    Dispense:  90 tablet    Refill:  0       Follow-up: Return in about 1 month (around 01/15/2016) for evaluation, med refill.    [name], MD  This dictation was performed utilizing Dragon voice recognition software.  Please excuse any unintentional or mistaken typographical errors as a result of its unedited utilization.

## 2015-12-15 NOTE — Patient Instructions (Signed)
You were given one prescription for Oxycodone today. 

## 2015-12-15 NOTE — Progress Notes (Signed)
Safety precautions to be maintained throughout the outpatient stay will include: orient to surroundings, keep bed in low position, maintain call bell within reach at all times, provide assistance with transfer out of bed and ambulation.  

## 2015-12-19 DIAGNOSIS — M79675 Pain in left toe(s): Secondary | ICD-10-CM | POA: Diagnosis not present

## 2015-12-19 DIAGNOSIS — L600 Ingrowing nail: Secondary | ICD-10-CM | POA: Diagnosis not present

## 2015-12-19 DIAGNOSIS — M79674 Pain in right toe(s): Secondary | ICD-10-CM | POA: Diagnosis not present

## 2015-12-19 DIAGNOSIS — B351 Tinea unguium: Secondary | ICD-10-CM | POA: Diagnosis not present

## 2016-01-03 ENCOUNTER — Ambulatory Visit: Payer: Medicare Other | Attending: Anesthesiology | Admitting: Anesthesiology

## 2016-01-03 ENCOUNTER — Encounter: Payer: Self-pay | Admitting: Anesthesiology

## 2016-01-03 VITALS — BP 149/56 | HR 81 | Temp 98.1°F | Resp 18 | Ht 61.0 in | Wt 206.0 lb

## 2016-01-03 DIAGNOSIS — M545 Low back pain: Secondary | ICD-10-CM | POA: Diagnosis not present

## 2016-01-03 DIAGNOSIS — M79641 Pain in right hand: Secondary | ICD-10-CM | POA: Diagnosis present

## 2016-01-03 DIAGNOSIS — M25561 Pain in right knee: Secondary | ICD-10-CM | POA: Diagnosis present

## 2016-01-03 DIAGNOSIS — M961 Postlaminectomy syndrome, not elsewhere classified: Secondary | ICD-10-CM | POA: Diagnosis not present

## 2016-01-03 DIAGNOSIS — M25562 Pain in left knee: Secondary | ICD-10-CM | POA: Diagnosis present

## 2016-01-03 DIAGNOSIS — M5386 Other specified dorsopathies, lumbar region: Secondary | ICD-10-CM

## 2016-01-03 DIAGNOSIS — M5431 Sciatica, right side: Secondary | ICD-10-CM | POA: Diagnosis not present

## 2016-01-03 MED ORDER — OXYCODONE-ACETAMINOPHEN 7.5-325 MG PO TABS: 1.0000 | ORAL_TABLET | Freq: Three times a day (TID) | ORAL | 0 refills | Status: DC

## 2016-01-03 NOTE — Progress Notes (Signed)
Subjective:  Patient ID: Taylor [name], female    DOB: Mar 01, 1932  Age: 82 y.o. MRN: [id number]  CC: Knee Pain (bilateral) and Hand Pain (bilateral)   Service Provided on Last Visit: Evaluation, Med Refill  PROCEDURE:None  HPI Taylor [name] presents For reevaluation today. She was last seen 1 month ago and states that the quality characteristic and distribution of her pain have been stable in nature. Medication she is taking including Percocet seemed to keep the pain under good control and based on her narcotic assessment sheet she is handling these medications well and they are achieving improvement in her overall functional status. Otherwise no changes are noted in upper or lower extremity strength or function.  By history She is a pleasant 82 year old black female with long-standing history of low back pain. She reports a previous fusion back in 2001 at L5-S1 and this was cooperated with previous x-rays of her low back. She's had no recent studies that reports a pain that starts in her low back with radiation into the right posterior and anterior leg. She has associated numbness and tingling that is worse with standing affecting the anterior knee and anterior lower leg right side. Furthermore she reports a VAS score of maximum 8 to 10 and averaging a 6 but  not influenced by time of day but aggravated by any type of motion or activity. Alleviating factors include walking resting warm showers or medication management. She has been on Percocet 7.5 mg tablets for an extended period of time and these are the only thing she reports giving her relief. No changes in the quality of her low back pain and lower extremity pain are noted her urine tox screen was negative and she is requesting initiation of opioid therapy today.   She has had a history of some cervical pain as well affecting the right shoulder region and down the right arm into the right fifth finger however this is reportedly better at  this time. This is no longer a current problem for other than some occasional muscle stiffness in the posterior right trapezius region. She denies any weakness in the upper extremities at this time  History [name] has a past medical history of Abnormal mammogram, unspecified (2013); Breast screening, unspecified (2013); Diabetes mellitus without complication ([place]) ([id number]); GERD (gastroesophageal reflux disease); Glaucoma (2003); Gout; Hyperlipidemia (2008); Hypertension (1980's); Lump or mass in breast (01/03/2012); Obesity, unspecified (2013); Osteoporosis; Shingles (2013); and Special screening for malignant neoplasms, colon (2013).   She has a past surgical history that includes Abdominal hysterectomy (1958); Knee surgery (2009,2011,2012); Lipoma excision (1998); Spine surgery (2004); Tonsillectomy; Colonoscopy (2003); Coronary artery bypass graft (Left, 01/03/2012,01/24/2012); Cataract extraction, bilateral (Bilateral); Cholecystectomy; Back surgery; and Knee surgery.   Her family history includes Cancer in her mother; Heart disease in her father; Stroke in her mother.She reports that she has never smoked. She has never used smokeless tobacco. She reports that she does not drink alcohol or use drugs.  No results found for this or any previous visit.  Tox[facility] Select 13  Date Value Ref Range Status  11/14/2015 FINAL  Final    Comment:    ==================================================================== TOXASSU[facility] SELECT 13 (MW) ==================================================================== Test                             Result       Flag       Units Drug Absent but Declared for Prescription Verification  Oxycodone                      Not Detected UNEXPECTED ng/mg creat ==================================================================== Test                      Result    Flag   Units      Ref Range   Creatinine              92               mg/dL       >=20 ==================================================================== Declared Medications:  The flagging and interpretation on this report are based on the  following declared medications.  Unexpected results may arise from  inaccuracies in the declared medications.  **Note: The testing scope of this panel includes these medications:  Oxycodone (Oxycodone Acetaminophen)  **Note: The testing scope of this panel does not include following  reported medications:  Acetaminophen (Oxycodone Acetaminophen)  Alendronate (Fosamax)  Amlodipine (Norvasc)  Bimatoprost (Lumigan)  Bromfenac (Prolensa)  Cholecalciferol  Cyclobenzaprine (Flexeril)  Estrogen (Premarin)  Eye Drop  Fluticasone (Advair)  Fluticasone (Flonase)  Metformin  Omeprazole  Pravastatin  Prednisone  Quinapril (Accupril)  Salmeterol (Advair) ==================================================================== For clinical consultation, please call [phone]. ====================================================================     Outpatient Medications Prior to Visit  Medication Sig Dispense Refill  . acetaminophen (TYLENOL) 500 MG tablet Take 1 tablet (500 mg total) by mouth 3 (three) times daily. 90 tablet 0  . alendronate (FOSAMAX) 70 MG tablet TAKE 1 TABLET WEEKLY 4 tablet 11  . amLODipine (NORVASC) 10 MG tablet Take 1 tablet (10 mg total) by mouth daily. 90 tablet 1  . bimatoprost (LUMIGAN) 0.01 % SOLN Place 1 drop into both eyes daily.     . brimonidine-timolol (COMBIGAN) 0.2-0.5 % ophthalmic solution Place 1 drop into both eyes See admin instructions. 1 drop into both eyes in the morning, then 1 drop into both eyes 8 hours later    . Cholecalciferol (VITAMIN D-1000 MAX ST) 1000 UNITS tablet Take 1,000 Units by mouth daily. Reported on 08/05/2015    . conjugated estrogens (PREMARIN) vaginal cream Place 1 Applicatorful vaginally daily. 42.5 g 5  . cyclobenzaprine (FLEXERIL) 5 MG tablet Take 5 mg by mouth 3  (three) times daily.    . DUREZOL 0.05 % EMUL Place 1 drop into both eyes 4 (four) times daily.     . fluticasone (FLONASE) 50 MCG/ACT nasal spray Place 2 sprays into both nostrils daily.     [name] glucose blood test strip Use as instructed 100 each 12  . metFORMIN (GLUCOPHAGE) 500 MG tablet Take 1 tablet (500 mg total) by mouth 2 (two) times daily with a meal. 180 tablet 1  . omeprazole (PRILOSEC) 20 MG capsule Take 1 capsule (20 mg total) by mouth daily. 90 capsule 1  . ONETOUCH DELICA LANCETS FINE MISC USE ONCE DAILY TO CHECK BLOOD SUGAR 100 each 0  . pravastatin (PRAVACHOL) 40 MG tablet Take 1 tablet (40 mg total) by mouth at bedtime. 90 tablet 1  . PROLENSA 0.07 % SOLN Reported on 08/11/2015  1  . quinapril (ACCUPRIL) 40 MG tablet Take 1 tablet (40 mg total) by mouth daily. 90 tablet 1  . ranitidine (ZANTAC) 150 MG tablet Take 1 tablet (150 mg total) by mouth 2 (two) times daily. Try to take this in place of Omeprazole 180 tablet 0  . oxyCODONE-acetaminophen (PERCOCET) 7.5-325 MG tablet Take 1 tablet by mouth  3 (three) times daily. 90 tablet 0   No facility-administered medications prior to visit.    Lab Results  Component Value Date   GLUCOSE 160 (H) 08/09/2015   CHOL 127 08/09/2015   TRIG 105 08/09/2015   HDL 47 08/09/2015   LDLCALC 59 08/09/2015   ALT 6 08/09/2015   AST 9 08/09/2015   NA 141 08/09/2015   K 5.2 08/09/2015   CL 105 08/09/2015   CREATININE 1.38 (H) 08/09/2015   BUN 22 08/09/2015   CO2 22 08/09/2015   HGBA1C 8.2 12/12/2015   MICROALBUR 100 12/12/2015    --------------------------------------------------------------------------------------------------------------------- Dg Chest 2 View  Result Date: 08/11/2015 CLINICAL DATA:  Acute onset of right-sided arm pain, radiating to the right shoulder. Initial encounter. EXAM: CHEST  2 VIEW COMPARISON:  Chest radiograph performed 06/11/2012 FINDINGS: The lungs are well-aerated and clear. There is no evidence of focal  opacification, pleural effusion or pneumothorax. The heart is borderline enlarged. No acute osseous abnormalities are seen. Clips are noted within the right upper quadrant, reflecting prior cholecystectomy. IMPRESSION: Borderline cardiomegaly, though the superior mediastinum is normal in appearance. The thoracic aorta is grossly unremarkable in appearance. No acute cardiopulmonary process seen. Electronically Signed   By: Garald Balding M.D.   On: 08/11/2015 23:19       ---------------------------------------------------------------------------------------------------------------------- Past Medical History:  Diagnosis Date  . Abnormal mammogram, unspecified 2013  . Breast screening, unspecified 2013  . Diabetes mellitus without complication ([place]) [id number]   non insulin dependent  . GERD (gastroesophageal reflux disease)   . Glaucoma 2003  . Gout   . Hyperlipidemia 2008  . Hypertension 1980's  . Lump or mass in breast 01/03/2012   left breast  . Obesity, unspecified 2013  . Osteoporosis   . Shingles 2013  . Special screening for malignant neoplasms, colon 2013    Past Surgical History:  Procedure Laterality Date  . ABDOMINAL HYSTERECTOMY  1958   menorrhagia  . BACK SURGERY    . CATARACT EXTRACTION, BILATERAL Bilateral    1st 06/07/15 and the 2nd 06/21/15  . CHOLECYSTECTOMY    . COLONOSCOPY  2003   [facility]  . CORONARY ARTERY BYPASS GRAFT Left 01/03/2012,01/24/2012   Left FNA, Left breast [facility] bx  . KNEE SURGERY  2009,2011,2012   twice on right and once on left  . KNEE SURGERY    . LIPOMA EXCISION  1998  . [place]  2004  . TONSILLECTOMY     age of 34    Family History  Problem Relation Age of Onset  . Cancer Mother     skin cancer  . Stroke Mother   . Heart disease Father     Social History  Substance Use Topics  . Smoking status: Never Smoker  . Smokeless tobacco: Never Used  . Alcohol use No     ---------------------------------------------------------------------------------------------------------------------- Social History   Social History  . Marital status: Divorced    Spouse name: N/A  . Number of children: N/A  . Years of education: N/A   Social History Main Topics  . Smoking status: Never Smoker  . Smokeless tobacco: Never Used  . Alcohol use No  . Drug use: No  . Sexual activity: No   Other Topics Concern  . None   Social History Narrative  . None    Scheduled Meds: Continuous Infusions: PRN Meds:.   BP (!) 149/56   Pulse 81   Temp 98.1 F (36.7 C) (Oral)   Resp 18   Ht  5\' 1"  (1.549 m)   Wt 206 lb (93.4 kg)   SpO2 99%   BMI 38.92 kg/m    BP Readings from Last 3 Encounters:  01/03/16 (!) 149/56  12/15/15 (!) 167/66  12/12/15 134/76     Wt Readings from Last 3 Encounters:  01/03/16 206 lb (93.4 kg)  12/15/15 207 lb (93.9 kg)  12/12/15 207 lb 11.2 oz (94.2 kg)     ----------------------------------------------------------------------------------------------------------------------  ROS Review of Systems    Objective:  BP (!) 149/56   Pulse 81   Temp 98.1 F (36.7 C) (Oral)   Resp 18   Ht 5\' 1"  (1.549 m)   Wt 206 lb (93.4 kg)   SpO2 99%   BMI 38.92 kg/m   Physical Exam she is a pleasant 82 year old black female in no acute distress alert and oriented in her wheelchair. Pupils are equally round reactive to light extraocular muscles intact Heart is regular rate and rhythm without murmur Lungs are clear to also dictation Otherwise her lower extremity strength and function are at baseline.     Assessment & Plan:   Delorus was seen today for knee pain and hand pain.  Diagnoses and all orders for this visit:  Failed back syndrome of lumbar spine  Low back derangement syndrome  Other orders -     oxyCODONE-acetaminophen (PERCOCET) 7.5-325 MG tablet; Take 1 tablet by mouth 3 (three) times  daily.     ----------------------------------------------------------------------------------------------------------------------  Problem List Items Addressed This Visit    None    Visit Diagnoses    Failed back syndrome of lumbar spine    -  Primary   Low back derangement syndrome       Relevant Medications   oxyCODONE-acetaminophen (PERCOCET) 7.5-325 MG tablet      ----------------------------------------------------------------------------------------------------------------------  1. Failed back syndrome of lumbar spine She seems to be doing well with her current medication regimen. She is denying any significant side effects and feels that overall her lifestyle function has improved and we will therefore keep her on this medication regimen at this time. She is to return to clinic in 1 month for reevaluation. 2. Sciatica associated with disorder of lumbar spine, right As above as above - Lumbar Epidural Injection; Future  3. Low back derangement syndrome As above - Lumbar Epidural Injection; Future    ----------------------------------------------------------------------------------------------------------------------  I am having Ms. Owczarzak maintain her bimatoprost, Cholecalciferol, fluticasone, glucose blood, conjugated estrogens, ONETOUCH DELICA LANCETS FINE, alendronate, DUREZOL, PROLENSA, omeprazole, brimonidine-timolol, ranitidine, acetaminophen, pravastatin, quinapril, amLODipine, metFORMIN, cyclobenzaprine, and oxyCODONE-acetaminophen.   Meds ordered this encounter  Medications  . oxyCODONE-acetaminophen (PERCOCET) 7.5-325 MG tablet    Sig: Take 1 tablet by mouth 3 (three) times daily.    Dispense:  90 tablet    Refill:  0    Do not fill until [id number]       Follow-up: Return in about 1 month (around 02/02/2016) for evaluation, med refill.    [name], MD  This dictation was performed utilizing Dragon voice recognition software.  Please excuse  any unintentional or mistaken typographical errors as a result of its unedited utilization.

## 2016-01-03 NOTE — Progress Notes (Signed)
Safety precautions to be maintained throughout the outpatient stay will include: orient to surroundings, keep bed in low position, maintain call bell within reach at all times, provide assistance with transfer out of bed and ambulation.  

## 2016-01-05 ENCOUNTER — Ambulatory Visit: Payer: Medicare Other | Admitting: Anesthesiology

## 2016-01-18 DIAGNOSIS — H2013 Chronic iridocyclitis, bilateral: Secondary | ICD-10-CM | POA: Diagnosis not present

## 2016-01-31 ENCOUNTER — Encounter: Payer: Self-pay | Admitting: Anesthesiology

## 2016-01-31 ENCOUNTER — Ambulatory Visit: Payer: Medicare Other | Attending: Anesthesiology | Admitting: Anesthesiology

## 2016-01-31 VITALS — BP 146/63 | HR 63 | Temp 98.4°F | Resp 15 | Ht 61.0 in | Wt 268.0 lb

## 2016-01-31 DIAGNOSIS — Z823 Family history of stroke: Secondary | ICD-10-CM | POA: Insufficient documentation

## 2016-01-31 DIAGNOSIS — K219 Gastro-esophageal reflux disease without esophagitis: Secondary | ICD-10-CM | POA: Diagnosis not present

## 2016-01-31 DIAGNOSIS — Z951 Presence of aortocoronary bypass graft: Secondary | ICD-10-CM | POA: Insufficient documentation

## 2016-01-31 DIAGNOSIS — E785 Hyperlipidemia, unspecified: Secondary | ICD-10-CM | POA: Diagnosis not present

## 2016-01-31 DIAGNOSIS — M545 Low back pain: Secondary | ICD-10-CM

## 2016-01-31 DIAGNOSIS — M79641 Pain in right hand: Secondary | ICD-10-CM | POA: Diagnosis not present

## 2016-01-31 DIAGNOSIS — Z8249 Family history of ischemic heart disease and other diseases of the circulatory system: Secondary | ICD-10-CM | POA: Insufficient documentation

## 2016-01-31 DIAGNOSIS — G894 Chronic pain syndrome: Secondary | ICD-10-CM | POA: Diagnosis not present

## 2016-01-31 DIAGNOSIS — Z7984 Long term (current) use of oral hypoglycemic drugs: Secondary | ICD-10-CM | POA: Diagnosis not present

## 2016-01-31 DIAGNOSIS — M961 Postlaminectomy syndrome, not elsewhere classified: Secondary | ICD-10-CM | POA: Diagnosis not present

## 2016-01-31 DIAGNOSIS — Z8619 Personal history of other infectious and parasitic diseases: Secondary | ICD-10-CM | POA: Insufficient documentation

## 2016-01-31 DIAGNOSIS — E669 Obesity, unspecified: Secondary | ICD-10-CM | POA: Insufficient documentation

## 2016-01-31 DIAGNOSIS — Z90710 Acquired absence of both cervix and uterus: Secondary | ICD-10-CM | POA: Insufficient documentation

## 2016-01-31 DIAGNOSIS — M810 Age-related osteoporosis without current pathological fracture: Secondary | ICD-10-CM | POA: Diagnosis not present

## 2016-01-31 DIAGNOSIS — M25512 Pain in left shoulder: Secondary | ICD-10-CM | POA: Insufficient documentation

## 2016-01-31 DIAGNOSIS — Z808 Family history of malignant neoplasm of other organs or systems: Secondary | ICD-10-CM | POA: Insufficient documentation

## 2016-01-31 DIAGNOSIS — H409 Unspecified glaucoma: Secondary | ICD-10-CM | POA: Diagnosis not present

## 2016-01-31 DIAGNOSIS — Z981 Arthrodesis status: Secondary | ICD-10-CM | POA: Diagnosis not present

## 2016-01-31 DIAGNOSIS — Z9841 Cataract extraction status, right eye: Secondary | ICD-10-CM | POA: Insufficient documentation

## 2016-01-31 DIAGNOSIS — Z6841 Body Mass Index (BMI) 40.0 and over, adult: Secondary | ICD-10-CM | POA: Diagnosis not present

## 2016-01-31 DIAGNOSIS — Z9842 Cataract extraction status, left eye: Secondary | ICD-10-CM | POA: Diagnosis not present

## 2016-01-31 DIAGNOSIS — I10 Essential (primary) hypertension: Secondary | ICD-10-CM | POA: Diagnosis not present

## 2016-01-31 DIAGNOSIS — Z9049 Acquired absence of other specified parts of digestive tract: Secondary | ICD-10-CM | POA: Diagnosis not present

## 2016-01-31 DIAGNOSIS — E119 Type 2 diabetes mellitus without complications: Secondary | ICD-10-CM | POA: Diagnosis not present

## 2016-01-31 DIAGNOSIS — M109 Gout, unspecified: Secondary | ICD-10-CM | POA: Diagnosis not present

## 2016-01-31 DIAGNOSIS — M5386 Other specified dorsopathies, lumbar region: Secondary | ICD-10-CM

## 2016-01-31 MED ORDER — OXYCODONE-ACETAMINOPHEN 7.5-325 MG PO TABS: 1.0000 | ORAL_TABLET | Freq: Three times a day (TID) | ORAL | 0 refills | Status: DC

## 2016-01-31 NOTE — Progress Notes (Signed)
Safety precautions to be maintained throughout the outpatient stay will include: orient to surroundings, keep bed in low position, maintain call bell within reach at all times, provide assistance with transfer out of bed and ambulation.  

## 2016-02-01 NOTE — Progress Notes (Signed)
Subjective:  Patient ID: Taylor [name], female    DOB: [birth year]  Age: 82 y.o. MRN: [id number]  CC: Shoulder Pain (left); Knee Pain (right ); and Hand Pain (right )   Service Provided on Last Visit: Evaluation, Med Refill  PROCEDURE:None  HPI Taylor [name] presents For reevaluation today. She is followed for her chronic low back pain and was here proximally 1 month ago. The quality characteristic addition we should've her pain remains stable without change in lower extremity strength or function. Furthermore she is tolerating her medications without problem. I have reviewed the practitioner database and she has been compliant.   She is a pleasant 82 year old black female with long-standing history of low back pain. She reports a previous fusion back in 2001 at L5-S1 and this was cooperated with previous x-rays of her low back. She's had no recent studies that reports a pain that starts in her low back with radiation into the right posterior and anterior leg. She has associated numbness and tingling that is worse with standing affecting the anterior knee and anterior lower leg right side. Furthermore she reports a VAS score of maximum 8 to 10 and averaging a 6 but  not influenced by time of day but aggravated by any type of motion or activity. Alleviating factors include walking resting warm showers or medication management. She has been on Percocet 7.5 mg tablets for an extended period of time and these are the only thing she reports giving her relief. No changes in the quality of her low back pain and lower extremity pain are noted her urine tox screen was negative and she is requesting initiation of opioid therapy today.   She has had a history of some cervical pain as well affecting the right shoulder region and down the right arm into the right fifth finger however this is reportedly better at this time. This is no longer a current problem for other than some occasional muscle stiffness in  the posterior right trapezius region. She denies any weakness in the upper extremities at this time  History Taylor [name] has a past medical history of Abnormal mammogram, unspecified (2013); Breast screening, unspecified (2013); Diabetes mellitus without complication ([place]) ([id number]); GERD (gastroesophageal reflux disease); Glaucoma (2003); Gout; Hyperlipidemia (2008); Hypertension (1980's); Lump or mass in breast (01/03/2012); Obesity, unspecified (2013); Osteoporosis; Shingles (2013); and Special screening for malignant neoplasms, colon (2013).   She has a past surgical history that includes Abdominal hysterectomy (1958); Knee surgery (2009,2011,2012); Lipoma excision (1998); Spine surgery (2004); Tonsillectomy; Colonoscopy (2003); Coronary artery bypass graft (Left, 01/03/2012,01/24/2012); Cataract extraction, bilateral (Bilateral); Cholecystectomy; Back surgery; and Knee surgery.   Her family history includes Cancer in her mother; Heart disease in her father; Stroke in her mother.She reports that she has never smoked. She has never used smokeless tobacco. She reports that she does not drink alcohol or use drugs.  No results found for this or any previous visit.  ToxAssure Select 13  Date Value Ref Range Status  11/14/2015 FINAL  Final    Comment:    ==================================================================== TOXASSURE SELECT 13 (MW) ==================================================================== Test                             Result       Flag       Units Drug Absent but Declared for Prescription Verification   Oxycodone  Not Detected UNEXPECTED ng/mg creat ==================================================================== Test                      Result    Flag   Units      Ref Range   Creatinine              92               mg/dL      >=20 ==================================================================== Declared Medications:  The flagging and  interpretation on this report are based on the  following declared medications.  Unexpected results may arise from  inaccuracies in the declared medications.  **Note: The testing scope of this panel includes these medications:  Oxycodone (Oxycodone Acetaminophen)  **Note: The testing scope of this panel does not include following  reported medications:  Acetaminophen (Oxycodone Acetaminophen)  Alendronate (Fosamax)  Amlodipine (Norvasc)  Bimatoprost (Lumigan)  Bromfenac (Prolensa)  Cholecalciferol  Cyclobenzaprine (Flexeril)  Estrogen (Premarin)  Eye Drop  Fluticasone (Advair)  Fluticasone (Flonase)  Metformin  Omeprazole  Pravastatin  Prednisone  Quinapril (Accupril)  Salmeterol (Advair) ==================================================================== For clinical consultation, please call [phone]. ====================================================================     Outpatient Medications Prior to Visit  Medication Sig Dispense Refill  . alendronate (FOSAMAX) 70 MG tablet TAKE 1 TABLET WEEKLY 4 tablet 11  . amLODipine (NORVASC) 10 MG tablet Take 1 tablet (10 mg total) by mouth daily. 90 tablet 1  . bimatoprost (LUMIGAN) 0.01 % SOLN Place 1 drop into both eyes daily.     . brimonidine-timolol (COMBIGAN) 0.2-0.5 % ophthalmic solution Place 1 drop into both eyes See admin instructions. 1 drop into both eyes in the morning, then 1 drop into both eyes 8 hours later    . conjugated estrogens (PREMARIN) vaginal cream Place 1 Applicatorful vaginally daily. 42.5 g 5  . DUREZOL 0.05 % EMUL Place 1 drop into both eyes 4 (four) times daily.     . fluticasone (FLONASE) 50 MCG/ACT nasal spray Place 2 sprays into both nostrils daily.     [name] glucose blood test strip Use as instructed 100 each 12  . metFORMIN (GLUCOPHAGE) 500 MG tablet Take 1 tablet (500 mg total) by mouth 2 (two) times daily with a meal. 180 tablet 1  . omeprazole (PRILOSEC) 20 MG capsule Take 1 capsule (20 mg  total) by mouth daily. 90 capsule 1  . ONETOUCH DELICA LANCETS FINE MISC USE ONCE DAILY TO CHECK BLOOD SUGAR 100 each 0  . pravastatin (PRAVACHOL) 40 MG tablet Take 1 tablet (40 mg total) by mouth at bedtime. 90 tablet 1  . PROLENSA 0.07 % SOLN Reported on 08/11/2015  1  . quinapril (ACCUPRIL) 40 MG tablet Take 1 tablet (40 mg total) by mouth daily. 90 tablet 1  . oxyCODONE-acetaminophen (PERCOCET) 7.5-325 MG tablet Take 1 tablet by mouth 3 (three) times daily. 90 tablet 0  . acetaminophen (TYLENOL) 500 MG tablet Take 1 tablet (500 mg total) by mouth 3 (three) times daily. (Patient not taking: Reported on 01/31/2016) 90 tablet 0  . Cholecalciferol (VITAMIN D-1000 MAX ST) 1000 UNITS tablet Take 1,000 Units by mouth daily. Reported on 08/05/2015    . cyclobenzaprine (FLEXERIL) 5 MG tablet Take 5 mg by mouth 3 (three) times daily.    . ranitidine (ZANTAC) 150 MG tablet Take 1 tablet (150 mg total) by mouth 2 (two) times daily. Try to take this in place of Omeprazole (Patient not taking: Reported on 01/31/2016) 180 tablet 0   No  facility-administered medications prior to visit.    Lab Results  Component Value Date   GLUCOSE 160 (H) 08/09/2015   CHOL 127 08/09/2015   TRIG 105 08/09/2015   HDL 47 08/09/2015   LDLCALC 59 08/09/2015   ALT 6 08/09/2015   AST 9 08/09/2015   NA 141 08/09/2015   K 5.2 08/09/2015   CL 105 08/09/2015   CREATININE 1.38 (H) 08/09/2015   BUN 22 08/09/2015   CO2 22 08/09/2015   HGBA1C 8.2 12/12/2015   MICROALBUR 100 12/12/2015    --------------------------------------------------------------------------------------------------------------------- Dg Chest 2 View  Result Date: 08/11/2015 CLINICAL DATA:  Acute onset of right-sided arm pain, radiating to the right shoulder. Initial encounter. EXAM: CHEST  2 VIEW COMPARISON:  Chest radiograph performed 06/11/2012 FINDINGS: The lungs are well-aerated and clear. There is no evidence of focal opacification, pleural effusion  or pneumothorax. The heart is borderline enlarged. No acute osseous abnormalities are seen. Clips are noted within the right upper quadrant, reflecting prior cholecystectomy. IMPRESSION: Borderline cardiomegaly, though the superior mediastinum is normal in appearance. The thoracic aorta is grossly unremarkable in appearance. No acute cardiopulmonary process seen. Electronically Signed   By: Garald Balding M.D.   On: 08/11/2015 23:19       ---------------------------------------------------------------------------------------------------------------------- Past Medical History:  Diagnosis Date  . Abnormal mammogram, unspecified 2013  . Breast screening, unspecified 2013  . Diabetes mellitus without complication ([place]) [id number]   non insulin dependent  . GERD (gastroesophageal reflux disease)   . Glaucoma 2003  . Gout   . Hyperlipidemia 2008  . Hypertension 1980's  . Lump or mass in breast 01/03/2012   left breast  . Obesity, unspecified 2013  . Osteoporosis   . Shingles 2013  . Special screening for malignant neoplasms, colon 2013    Past Surgical History:  Procedure Laterality Date  . ABDOMINAL HYSTERECTOMY  1958   menorrhagia  . BACK SURGERY    . CATARACT EXTRACTION, BILATERAL Bilateral    1st 06/07/15 and the 2nd 06/21/15  . CHOLECYSTECTOMY    . COLONOSCOPY  2003   [facility]  . CORONARY ARTERY BYPASS GRAFT Left 01/03/2012,01/24/2012   Left FNA, Left breast [facility] bx  . KNEE SURGERY  2009,2011,2012   twice on right and once on left  . KNEE SURGERY    . LIPOMA EXCISION  1998  . [place]  2004  . TONSILLECTOMY     age of 43    Family History  Problem Relation Age of Onset  . Cancer Mother     skin cancer  . Stroke Mother   . Heart disease Father     Social History  Substance Use Topics  . Smoking status: Never Smoker  . Smokeless tobacco: Never Used  . Alcohol use No     ---------------------------------------------------------------------------------------------------------------------- Social History   Social History  . Marital status: Divorced    Spouse name: N/A  . Number of children: N/A  . Years of education: N/A   Social History Main Topics  . Smoking status: Never Smoker  . Smokeless tobacco: Never Used  . Alcohol use No  . Drug use: No  . Sexual activity: No   Other Topics Concern  . None   Social History Narrative  . None    Scheduled Meds: Continuous Infusions: PRN Meds:.   BP (!) 146/63 (BP Location: Left Arm, Patient Position: Sitting, Cuff Size: Large)   Pulse 63   Temp 98.4 F (36.9 C) (Oral)   Resp 15   Ht  5\' 1"  (1.549 m)   Wt 268 lb (121.6 kg)   SpO2 95%   BMI 50.64 kg/m    BP Readings from Last 3 Encounters:  01/31/16 (!) 146/63  01/03/16 (!) 149/56  12/15/15 (!) 167/66     Wt Readings from Last 3 Encounters:  01/31/16 268 lb (121.6 kg)  01/03/16 206 lb (93.4 kg)  12/15/15 207 lb (93.9 kg)     ----------------------------------------------------------------------------------------------------------------------  ROS Review of Systems  No interval change noted    Objective:  BP (!) 146/63 (BP Location: Left Arm, Patient Position: Sitting, Cuff Size: Large)   Pulse 63   Temp 98.4 F (36.9 C) (Oral)   Resp 15   Ht 5\' 1"  (1.549 m)   Wt 268 lb (121.6 kg)   SpO2 95%   BMI 50.64 kg/m   Physical Exam she is a pleasant 82 year old black female in no acute distress alert and oriented in her wheelchair. Pupils are equally round reactive to light extraocular muscles intact Heart is regular rate and rhythm without murmur Lungs are clear to also dictation Otherwise her lower extremity strength and function are at baseline.     Assessment & Plan:   Taylor [name] was seen today for shoulder pain, knee pain and hand pain.  Diagnoses and all orders for this visit:  Failed back syndrome of lumbar  spine  Low back derangement syndrome  Chronic pain syndrome -     ToxASSURE Select 13 (MW), Urine  Pain in joint of left shoulder  Other orders -     oxyCODONE-acetaminophen (PERCOCET) 7.5-325 MG tablet; Take 1 tablet by mouth 3 (three) times daily.     ----------------------------------------------------------------------------------------------------------------------  Problem List Items Addressed This Visit      Other   Chronic pain   Relevant Medications   oxyCODONE-acetaminophen (PERCOCET) 7.5-325 MG tablet   Other Relevant Orders   ToxASSURE Select 13 (MW), Urine    Other Visit Diagnoses    Failed back syndrome of lumbar spine    -  Primary   Low back derangement syndrome       Relevant Medications   oxyCODONE-acetaminophen (PERCOCET) 7.5-325 MG tablet   Pain in joint of left shoulder          ----------------------------------------------------------------------------------------------------------------------  1. Failed back syndrome of lumbar spine We will refill her medications today with return to clinic in 1 month.  2. Sciatica associated with disorder of lumbar spine, right As above as above - Lumbar Epidural Injection; Future  3. Low back derangement syndrome As above - Lumbar Epidural Injection; Future    ----------------------------------------------------------------------------------------------------------------------  I am having Taylor [name] maintain her bimatoprost, Cholecalciferol, fluticasone, glucose blood, conjugated estrogens, ONETOUCH DELICA LANCETS FINE, alendronate, DUREZOL, PROLENSA, omeprazole, brimonidine-timolol, ranitidine, acetaminophen, pravastatin, quinapril, amLODipine, metFORMIN, cyclobenzaprine, and oxyCODONE-acetaminophen.   Meds ordered this encounter  Medications  . oxyCODONE-acetaminophen (PERCOCET) 7.5-325 MG tablet    Sig: Take 1 tablet by mouth 3 (three) times daily.    Dispense:  90 tablet    Refill:  0    Do  not fill until [id number]       Follow-up: Return in about 1 month (around 03/02/2016) for evaluation.    [name], MD  This dictation was performed utilizing Dragon voice recognition software.  Please excuse any unintentional or mistaken typographical errors as a result of its unedited utilization.

## 2016-02-02 DIAGNOSIS — L03031 Cellulitis of right toe: Secondary | ICD-10-CM | POA: Diagnosis not present

## 2016-02-02 DIAGNOSIS — L600 Ingrowing nail: Secondary | ICD-10-CM | POA: Diagnosis not present

## 2016-02-05 LAB — TOXASSURE SELECT 13 (MW), URINE

## 2016-02-15 DIAGNOSIS — H2013 Chronic iridocyclitis, bilateral: Secondary | ICD-10-CM | POA: Diagnosis not present

## 2016-02-29 ENCOUNTER — Ambulatory Visit: Payer: Medicare Other | Attending: Anesthesiology | Admitting: Anesthesiology

## 2016-02-29 ENCOUNTER — Encounter: Payer: Self-pay | Admitting: Anesthesiology

## 2016-02-29 VITALS — BP 174/67 | HR 83 | Temp 98.3°F | Resp 16 | Ht 61.0 in | Wt 268.0 lb

## 2016-02-29 DIAGNOSIS — M79641 Pain in right hand: Secondary | ICD-10-CM | POA: Diagnosis not present

## 2016-02-29 DIAGNOSIS — M79642 Pain in left hand: Secondary | ICD-10-CM | POA: Insufficient documentation

## 2016-02-29 DIAGNOSIS — E119 Type 2 diabetes mellitus without complications: Secondary | ICD-10-CM | POA: Insufficient documentation

## 2016-02-29 DIAGNOSIS — Z981 Arthrodesis status: Secondary | ICD-10-CM | POA: Diagnosis not present

## 2016-02-29 DIAGNOSIS — M542 Cervicalgia: Secondary | ICD-10-CM | POA: Insufficient documentation

## 2016-02-29 DIAGNOSIS — M545 Low back pain: Secondary | ICD-10-CM | POA: Diagnosis not present

## 2016-02-29 DIAGNOSIS — E785 Hyperlipidemia, unspecified: Secondary | ICD-10-CM | POA: Insufficient documentation

## 2016-02-29 DIAGNOSIS — H409 Unspecified glaucoma: Secondary | ICD-10-CM | POA: Diagnosis not present

## 2016-02-29 DIAGNOSIS — Z7984 Long term (current) use of oral hypoglycemic drugs: Secondary | ICD-10-CM | POA: Diagnosis not present

## 2016-02-29 DIAGNOSIS — K219 Gastro-esophageal reflux disease without esophagitis: Secondary | ICD-10-CM | POA: Diagnosis not present

## 2016-02-29 DIAGNOSIS — I10 Essential (primary) hypertension: Secondary | ICD-10-CM | POA: Diagnosis not present

## 2016-02-29 DIAGNOSIS — M5386 Other specified dorsopathies, lumbar region: Secondary | ICD-10-CM

## 2016-02-29 DIAGNOSIS — G894 Chronic pain syndrome: Secondary | ICD-10-CM | POA: Diagnosis not present

## 2016-02-29 DIAGNOSIS — M961 Postlaminectomy syndrome, not elsewhere classified: Secondary | ICD-10-CM

## 2016-02-29 MED ORDER — OXYCODONE-ACETAMINOPHEN 7.5-325 MG PO TABS: 1.0000 | ORAL_TABLET | Freq: Three times a day (TID) | ORAL | 0 refills | Status: DC

## 2016-02-29 NOTE — Progress Notes (Signed)
Safety precautions to be maintained throughout the outpatient stay will include: orient to surroundings, keep bed in low position, maintain call bell within reach at all times, provide assistance with transfer out of bed and ambulation.  

## 2016-02-29 NOTE — Patient Instructions (Signed)

## 2016-03-01 NOTE — Progress Notes (Signed)
Subjective:  Patient ID: Taylor [name], female    DOB: [birth year]  Age: 83 y.o. MRN: [id number]  CC: Hand Pain (both); Back Pain (lower); and Neck Pain (both sides )      PROCEDURE:None  HPI Taylor [name] is here for reevaluation today. She continues to do well with her current medication regimen. We have reviewed the practitioner database and she is appropriate. She is tolerating her medications well with no significant side effects and based on her narcotic assessment sheet this continues to work well for her. She is receiving improved overall lifestyle function with her medications and they are well received.   She is a pleasant 83 year old black female with long-standing history of low back pain. She reports a previous fusion back in 2001 at L5-S1 and this was cooperated with previous x-rays of her low back. She's had no recent studies that reports a pain that starts in her low back with radiation into the right posterior and anterior leg. She has associated numbness and tingling that is worse with standing affecting the anterior knee and anterior lower leg right side. Furthermore she reports a VAS score of maximum 8 to 10 and averaging a 6 but  not influenced by time of day but aggravated by any type of motion or activity. Alleviating factors include walking resting warm showers or medication management. She has been on Percocet 7.5 mg tablets for an extended period of time and these are the only thing she reports giving her relief. No changes in the quality of her low back pain and lower extremity pain are noted her urine tox screen was negative and she is requesting initiation of opioid therapy today.   She has had a history of some cervical pain as well affecting the right shoulder region and down the right arm into the right fifth finger however this is reportedly better at this time. This is no longer a current problem for other than some occasional muscle stiffness in the posterior  right trapezius region. She denies any weakness in the upper extremities at this time  History [name] has a past medical history of Abnormal mammogram, unspecified (2013); Breast screening, unspecified (2013); Diabetes mellitus without complication ([place]) ([id number]); GERD (gastroesophageal reflux disease); Glaucoma (2003); Gout; Hyperlipidemia (2008); Hypertension (1980's); Lump or mass in breast (01/03/2012); Obesity, unspecified (2013); Osteoporosis; Shingles (2013); and Special screening for malignant neoplasms, colon (2013).   She has a past surgical history that includes Abdominal hysterectomy (1958); Knee surgery (2009,2011,2012); Lipoma excision (1998); Spine surgery (2004); Tonsillectomy; Colonoscopy (2003); Coronary artery bypass graft (Left, 01/03/2012,01/24/2012); Cataract extraction, bilateral (Bilateral); Cholecystectomy; Back surgery; and Knee surgery.   Her family history includes Cancer in her mother; Heart disease in her father; Stroke in her mother.She reports that she has never smoked. She has never used smokeless tobacco. She reports that she does not drink alcohol or use drugs.  No results found for this or any previous visit.  Tox[facility] Select 13  Date Value Ref Range Status  01/31/2016 FINAL  Final    Comment:    ==================================================================== TOX[facility] SELECT 13 (MW) ==================================================================== Test                             Result       Flag       Units Drug Present and Declared for Prescription Verification   Oxycodone  528          EXPECTED   ng/mg creat   Oxymorphone                    596          EXPECTED   ng/mg creat   Noroxycodone                   699          EXPECTED   ng/mg creat   Noroxymorphone                 240          EXPECTED   ng/mg creat    Sources of oxycodone are scheduled prescription medications.    Oxymorphone, noroxycodone, and noroxymorphone are  expected    metabolites of oxycodone. Oxymorphone is also available as a    scheduled prescription medication. ==================================================================== Test                      Result    Flag   Units      Ref Range   Creatinine              126              mg/dL      >=20 ==================================================================== Declared Medications:  The flagging and interpretation on this report are based on the  following declared medications.  Unexpected results may arise from  inaccuracies in the declared medications.  **Note: The testing scope of this panel includes these medications:  Oxycodone (Percocet)  **Note: The testing scope of this panel does not include following  reported medications:  Acetaminophen (Percocet)  Acetaminophen (Tylenol)  Alendronate (Fosamax)  Amlodipine (Norvasc)  Bimatoprost (Lumigan)  Bromfenac (Prolensa)  Cholecalciferol  Cyclobenzaprine (Flexeril)  Estrogen (Premarin)  Eye Drops  Fluticasone (Flonase)  Metformin  Omeprazole  Pravastatin (Pravachol)  Quinapril  Ranitidine (Zantac) ==================================================================== For clinical consultation, please call [phone]. ====================================================================     Outpatient Medications Prior to Visit  Medication Sig Dispense Refill  . acetaminophen (TYLENOL) 500 MG tablet Take 1 tablet (500 mg total) by mouth 3 (three) times daily. (Patient taking differently: Take 500 mg by mouth as needed. ) 90 tablet 0  . alendronate (FOSAMAX) 70 MG tablet TAKE 1 TABLET WEEKLY 4 tablet 11  . amLODipine (NORVASC) 10 MG tablet Take 1 tablet (10 mg total) by mouth daily. 90 tablet 1  . bimatoprost (LUMIGAN) 0.01 % SOLN Place 1 drop into both eyes daily.     . brimonidine-timolol (COMBIGAN) 0.2-0.5 % ophthalmic solution Place 1 drop into both eyes See admin instructions. 1 drop into both eyes in the  morning, then 1 drop into both eyes 8 hours later    . conjugated estrogens (PREMARIN) vaginal cream Place 1 Applicatorful vaginally daily. 42.5 g 5  . cyclobenzaprine (FLEXERIL) 5 MG tablet Take 5 mg by mouth 3 (three) times daily.    . fluticasone (FLONASE) 50 MCG/ACT nasal spray Place 2 sprays into both nostrils daily.     [name] glucose blood test strip Use as instructed 100 each 12  . metFORMIN (GLUCOPHAGE) 500 MG tablet Take 1 tablet (500 mg total) by mouth 2 (two) times daily with a meal. 180 tablet 1  . omeprazole (PRILOSEC) 20 MG capsule Take 1 capsule (20 mg total) by mouth daily. 90 capsule 1  . ONETOUCH DELICA LANCETS FINE MISC USE ONCE DAILY TO  CHECK BLOOD SUGAR 100 each 0  . pravastatin (PRAVACHOL) 40 MG tablet Take 1 tablet (40 mg total) by mouth at bedtime. 90 tablet 1  . quinapril (ACCUPRIL) 40 MG tablet Take 1 tablet (40 mg total) by mouth daily. 90 tablet 1  . ranitidine (ZANTAC) 150 MG tablet Take 1 tablet (150 mg total) by mouth 2 (two) times daily. Try to take this in place of Omeprazole 180 tablet 0  . oxyCODONE-acetaminophen (PERCOCET) 7.5-325 MG tablet Take 1 tablet by mouth 3 (three) times daily. 90 tablet 0  . Cholecalciferol (VITAMIN D-1000 MAX ST) 1000 UNITS tablet Take 1,000 Units by mouth daily. Reported on 08/05/2015    . DUREZOL 0.05 % EMUL Place 1 drop into both eyes 4 (four) times daily.     [name] PROLENSA 0.07 % SOLN Reported on 08/11/2015  1   No facility-administered medications prior to visit.    Lab Results  Component Value Date   GLUCOSE 160 (H) 08/09/2015   CHOL 127 08/09/2015   TRIG 105 08/09/2015   HDL 47 08/09/2015   LDLCALC 59 08/09/2015   ALT 6 08/09/2015   AST 9 08/09/2015   NA 141 08/09/2015   K 5.2 08/09/2015   CL 105 08/09/2015   CREATININE 1.38 (H) 08/09/2015   BUN 22 08/09/2015   CO2 22 08/09/2015   HGBA1C 8.2 12/12/2015   MICROALBUR 100 12/12/2015     --------------------------------------------------------------------------------------------------------------------- Dg Chest 2 View  Result Date: 08/11/2015 CLINICAL DATA:  Acute onset of right-sided arm pain, radiating to the right shoulder. Initial encounter. EXAM: CHEST  2 VIEW COMPARISON:  Chest radiograph performed 06/11/2012 FINDINGS: The lungs are well-aerated and clear. There is no evidence of focal opacification, pleural effusion or pneumothorax. The heart is borderline enlarged. No acute osseous abnormalities are seen. Clips are noted within the right upper quadrant, reflecting prior cholecystectomy. IMPRESSION: Borderline cardiomegaly, though the superior mediastinum is normal in appearance. The thoracic aorta is grossly unremarkable in appearance. No acute cardiopulmonary process seen. Electronically Signed   By: Garald Balding M.D.   On: 08/11/2015 23:19       ---------------------------------------------------------------------------------------------------------------------- Past Medical History:  Diagnosis Date  . Abnormal mammogram, unspecified 2013  . Breast screening, unspecified 2013  . Diabetes mellitus without complication ([place]) [id number]   non insulin dependent  . GERD (gastroesophageal reflux disease)   . Glaucoma 2003  . Gout   . Hyperlipidemia 2008  . Hypertension 1980's  . Lump or mass in breast 01/03/2012   left breast  . Obesity, unspecified 2013  . Osteoporosis   . Shingles 2013  . Special screening for malignant neoplasms, colon 2013    Past Surgical History:  Procedure Laterality Date  . ABDOMINAL HYSTERECTOMY  1958   menorrhagia  . BACK SURGERY    . CATARACT EXTRACTION, BILATERAL Bilateral    1st 06/07/15 and the 2nd 06/21/15  . CHOLECYSTECTOMY    . COLONOSCOPY  2003   [facility]  . CORONARY ARTERY BYPASS GRAFT Left 01/03/2012,01/24/2012   Left FNA, Left breast [facility] bx  . KNEE SURGERY  2009,2011,2012   twice on right and once on left  . KNEE  SURGERY    . LIPOMA EXCISION  1998  . [place]  2004  . TONSILLECTOMY     age of 63    Family History  Problem Relation Age of Onset  . Cancer Mother     skin cancer  . Stroke Mother   . Heart disease Father  Social History  Substance Use Topics  . Smoking status: Never Smoker  . Smokeless tobacco: Never Used  . Alcohol use No    ---------------------------------------------------------------------------------------------------------------------- Social History   Social History  . Marital status: Divorced    Spouse name: N/A  . Number of children: N/A  . Years of education: N/A   Social History Main Topics  . Smoking status: Never Smoker  . Smokeless tobacco: Never Used  . Alcohol use No  . Drug use: No  . Sexual activity: No   Other Topics Concern  . None   Social History Narrative  . None    Scheduled Meds: Continuous Infusions: PRN Meds:.   BP (!) 174/67 (BP Location: Left Arm, Patient Position: Sitting, Cuff Size: Large)   Pulse 83   Temp 98.3 F (36.8 C) (Oral)   Resp 16   Ht 5\' 1"  (1.549 m)   Wt 268 lb (121.6 kg)   SpO2 99%   BMI 50.64 kg/m    BP Readings from Last 3 Encounters:  02/29/16 (!) 174/67  01/31/16 (!) 146/63  01/03/16 (!) 149/56     Wt Readings from Last 3 Encounters:  02/29/16 268 lb (121.6 kg)  01/31/16 268 lb (121.6 kg)  01/03/16 206 lb (93.4 kg)     ----------------------------------------------------------------------------------------------------------------------  ROS Review of Systems  No interval change noted    Objective:  BP (!) 174/67 (BP Location: Left Arm, Patient Position: Sitting, Cuff Size: Large)   Pulse 83   Temp 98.3 F (36.8 C) (Oral)   Resp 16   Ht 5\' 1"  (1.549 m)   Wt 268 lb (121.6 kg)   SpO2 99%   BMI 50.64 kg/m   Physical Exam  Pupils are equally round reactive to light extraocular muscles intact Heart is regular rate and rhythm without murmur Lungs are clear to  Auscultation Her exam remained stable   Assessment & Plan:   Honestie was seen today for hand pain, back pain and neck pain.  Diagnoses and all orders for this visit:  Failed back syndrome of lumbar spine  Low back derangement syndrome  Chronic pain syndrome  Other orders -     oxyCODONE-acetaminophen (PERCOCET) 7.5-325 MG tablet; Take 1 tablet by mouth 3 (three) times daily.     ----------------------------------------------------------------------------------------------------------------------  Problem List Items Addressed This Visit      Other   Chronic pain   Relevant Medications   oxyCODONE-acetaminophen (PERCOCET) 7.5-325 MG tablet    Other Visit Diagnoses    Failed back syndrome of lumbar spine    -  Primary   Low back derangement syndrome       Relevant Medications   oxyCODONE-acetaminophen (PERCOCET) 7.5-325 MG tablet      ----------------------------------------------------------------------------------------------------------------------  1. Failed back syndrome of lumbar spine We will refill her medications today with return to clinic in 1 month. Oh other changes are recommended today. She is tolerating her medications well. My plan is to span her out to every 2 month review at the six-month mark. She is to continue follow-up with her primary care physicians. 2. Sciatica associated with disorder of lumbar spine, right As above as above - Lumbar Epidural Injection; Future  3. Low back derangement syndrome As above - Lumbar Epidural Injection; Future    ----------------------------------------------------------------------------------------------------------------------  I am having Ms. Myint maintain her bimatoprost, Cholecalciferol, fluticasone, glucose blood, conjugated estrogens, ONETOUCH DELICA LANCETS FINE, alendronate, DUREZOL, PROLENSA, omeprazole, brimonidine-timolol, ranitidine, acetaminophen, pravastatin, quinapril, amLODipine, metFORMIN,  cyclobenzaprine, Bromfenac Sodium, Bromfenac Sodium, brimonidine-timolol, and  oxyCODONE-acetaminophen.   Meds ordered this encounter  Medications  . Bromfenac Sodium 0.09 % SOLN    Sig: APPLY 1 DROP INTO AFFECTED EYE DAILY    Refill:  1  . Bromfenac Sodium 0.09 % SOLN    Sig: APPLY 1 DROP INTO AFFECTED EYE DAILY  . brimonidine-timolol (COMBIGAN) 0.2-0.5 % ophthalmic solution  . oxyCODONE-acetaminophen (PERCOCET) 7.5-325 MG tablet    Sig: Take 1 tablet by mouth 3 (three) times daily.    Dispense:  90 tablet    Refill:  0    Do not fill until [id number]       Follow-up: Return in about 1 month (around 03/31/2016) for evaluation, med refill.    [name], MD  This dictation was performed utilizing Dragon voice recognition software.  Please excuse any unintentional or mistaken typographical errors as a result of its unedited utilization.

## 2016-03-14 DIAGNOSIS — H2013 Chronic iridocyclitis, bilateral: Secondary | ICD-10-CM | POA: Diagnosis not present

## 2016-03-26 ENCOUNTER — Other Ambulatory Visit: Payer: Self-pay

## 2016-03-26 NOTE — Telephone Encounter (Signed)
Patient requesting refill of Omega 3 to [facility]. 

## 2016-03-27 ENCOUNTER — Encounter: Payer: Self-pay | Admitting: Anesthesiology

## 2016-03-27 ENCOUNTER — Ambulatory Visit: Payer: Medicare Other | Attending: Anesthesiology | Admitting: Anesthesiology

## 2016-03-27 VITALS — BP 188/63 | HR 56 | Temp 98.0°F | Resp 16 | Ht 61.0 in | Wt 210.0 lb

## 2016-03-27 DIAGNOSIS — K219 Gastro-esophageal reflux disease without esophagitis: Secondary | ICD-10-CM | POA: Diagnosis not present

## 2016-03-27 DIAGNOSIS — I10 Essential (primary) hypertension: Secondary | ICD-10-CM | POA: Diagnosis not present

## 2016-03-27 DIAGNOSIS — M5386 Other specified dorsopathies, lumbar region: Secondary | ICD-10-CM

## 2016-03-27 DIAGNOSIS — M79603 Pain in arm, unspecified: Secondary | ICD-10-CM | POA: Diagnosis not present

## 2016-03-27 DIAGNOSIS — Z6839 Body mass index (BMI) 39.0-39.9, adult: Secondary | ICD-10-CM | POA: Insufficient documentation

## 2016-03-27 DIAGNOSIS — G894 Chronic pain syndrome: Secondary | ICD-10-CM | POA: Insufficient documentation

## 2016-03-27 DIAGNOSIS — M179 Osteoarthritis of knee, unspecified: Secondary | ICD-10-CM | POA: Diagnosis not present

## 2016-03-27 DIAGNOSIS — M961 Postlaminectomy syndrome, not elsewhere classified: Secondary | ICD-10-CM | POA: Insufficient documentation

## 2016-03-27 DIAGNOSIS — Z8249 Family history of ischemic heart disease and other diseases of the circulatory system: Secondary | ICD-10-CM | POA: Insufficient documentation

## 2016-03-27 DIAGNOSIS — Z951 Presence of aortocoronary bypass graft: Secondary | ICD-10-CM | POA: Diagnosis not present

## 2016-03-27 DIAGNOSIS — Z9049 Acquired absence of other specified parts of digestive tract: Secondary | ICD-10-CM | POA: Diagnosis not present

## 2016-03-27 DIAGNOSIS — Z9841 Cataract extraction status, right eye: Secondary | ICD-10-CM | POA: Diagnosis not present

## 2016-03-27 DIAGNOSIS — H409 Unspecified glaucoma: Secondary | ICD-10-CM | POA: Diagnosis not present

## 2016-03-27 DIAGNOSIS — M109 Gout, unspecified: Secondary | ICD-10-CM | POA: Diagnosis not present

## 2016-03-27 DIAGNOSIS — Z8619 Personal history of other infectious and parasitic diseases: Secondary | ICD-10-CM | POA: Insufficient documentation

## 2016-03-27 DIAGNOSIS — Z9842 Cataract extraction status, left eye: Secondary | ICD-10-CM | POA: Diagnosis not present

## 2016-03-27 DIAGNOSIS — E119 Type 2 diabetes mellitus without complications: Secondary | ICD-10-CM | POA: Insufficient documentation

## 2016-03-27 DIAGNOSIS — E669 Obesity, unspecified: Secondary | ICD-10-CM | POA: Diagnosis not present

## 2016-03-27 DIAGNOSIS — M25512 Pain in left shoulder: Secondary | ICD-10-CM | POA: Insufficient documentation

## 2016-03-27 DIAGNOSIS — M542 Cervicalgia: Secondary | ICD-10-CM | POA: Diagnosis not present

## 2016-03-27 DIAGNOSIS — M5431 Sciatica, right side: Secondary | ICD-10-CM

## 2016-03-27 DIAGNOSIS — Z981 Arthrodesis status: Secondary | ICD-10-CM | POA: Diagnosis not present

## 2016-03-27 DIAGNOSIS — E785 Hyperlipidemia, unspecified: Secondary | ICD-10-CM | POA: Insufficient documentation

## 2016-03-27 DIAGNOSIS — Z823 Family history of stroke: Secondary | ICD-10-CM | POA: Insufficient documentation

## 2016-03-27 DIAGNOSIS — Z7984 Long term (current) use of oral hypoglycemic drugs: Secondary | ICD-10-CM | POA: Diagnosis not present

## 2016-03-27 DIAGNOSIS — Z79891 Long term (current) use of opiate analgesic: Secondary | ICD-10-CM | POA: Diagnosis not present

## 2016-03-27 DIAGNOSIS — M810 Age-related osteoporosis without current pathological fracture: Secondary | ICD-10-CM | POA: Insufficient documentation

## 2016-03-27 DIAGNOSIS — M545 Low back pain: Secondary | ICD-10-CM

## 2016-03-27 DIAGNOSIS — Z809 Family history of malignant neoplasm, unspecified: Secondary | ICD-10-CM | POA: Diagnosis not present

## 2016-03-27 MED ORDER — OXYCODONE-ACETAMINOPHEN 7.5-325 MG PO TABS: 1.0000 | ORAL_TABLET | Freq: Three times a day (TID) | ORAL | 0 refills | Status: DC

## 2016-03-27 NOTE — Progress Notes (Signed)
Safety precautions to be maintained throughout the outpatient stay will include: orient to surroundings, keep bed in low position, maintain call bell within reach at all times, provide assistance with transfer out of bed and ambulation.  

## 2016-03-27 NOTE — Patient Instructions (Signed)
You were given 2 prescriptions for Percocet

## 2016-03-28 NOTE — Progress Notes (Signed)
Subjective:  Patient ID: Taylor [name], female    DOB: [birth year]  Age: 83 y.o. MRN: [id number]  CC: Arm Pain (both) and Knee Pain (right)      PROCEDURE:None  HPI [name] presents for reevaluation. She was last seen approximately a month ago and continues to have low back pain which radiates into her right knee and right foot. The pain has been recalcitrant and generally responds favorably to her opioid regimen. Percocet keeps her pain under good control as reported on her lower narcotic assessment sheet. Otherwise she is in her usual state of health with no new changes in bowel or bladder function and her strength has been at baseline.   She is a pleasant 83 year old black female with long-standing history of low back pain. She reports a previous fusion back in 2001 at L5-S1 and this was cooperated with previous x-rays of her low back. She's had no recent studies that reports a pain that starts in her low back with radiation into the right posterior and anterior leg. She has associated numbness and tingling that is worse with standing affecting the anterior knee and anterior lower leg right side. Furthermore she reports a VAS score of maximum 8 to 10 and averaging a 6 but  not influenced by time of day but aggravated by any type of motion or activity. Alleviating factors include walking resting warm showers or medication management. She has been on Percocet 7.5 mg tablets for an extended period of time and these are the only thing she reports giving her relief. No changes in the quality of her low back pain and lower extremity pain are noted her urine tox screen was negative and she is requesting initiation of opioid therapy today.   She has had a history of some cervical pain as well affecting the right shoulder region and down the right arm into the right fifth finger however this is reportedly better at this time. This is no longer a current problem for other than some occasional  muscle stiffness in the posterior right trapezius region. She denies any weakness in the upper extremities at this time  History Mittie has a past medical history of Abnormal mammogram, unspecified (2013); Breast screening, unspecified (2013); Diabetes mellitus without complication ([place]) ([id number]); GERD (gastroesophageal reflux disease); Glaucoma (2003); Gout; Hyperlipidemia (2008); Hypertension (1980's); Lump or mass in breast (01/03/2012); Obesity, unspecified (2013); Osteoporosis; Shingles (2013); and Special screening for malignant neoplasms, colon (2013).   She has a past surgical history that includes Abdominal hysterectomy (1958); Knee surgery (2009,2011,2012); Lipoma excision (1998); Spine surgery (2004); Tonsillectomy; Colonoscopy (2003); Coronary artery bypass graft (Left, 01/03/2012,01/24/2012); Cataract extraction, bilateral (Bilateral); Cholecystectomy; Back surgery; and Knee surgery.   Her family history includes Cancer in her mother; Heart disease in her father; Stroke in her mother.She reports that she has never smoked. She has never used smokeless tobacco. She reports that she does not drink alcohol or use drugs.  No results found for this or any previous visit.  [facility]ox[facility] Select 13  Date Value Ref Range Status  01/31/2016 FINAL  Final    Comment:    ==================================================================== TOX[facility] SELECT 13 (MW) ==================================================================== Test                             Result       Flag       Units Drug Present and Declared for Prescription Verification   Oxycodone  528          EXPECTED   ng/mg creat   Oxymorphone                    596          EXPECTED   ng/mg creat   Noroxycodone                   699          EXPECTED   ng/mg creat   Noroxymorphone                 240          EXPECTED   ng/mg creat    Sources of oxycodone are scheduled prescription medications.    Oxymorphone,  noroxycodone, and noroxymorphone are expected    metabolites of oxycodone. Oxymorphone is also available as a    scheduled prescription medication. ==================================================================== Test                      Result    Flag   Units      Ref Range   Creatinine              126              mg/dL      >=20 ==================================================================== Declared Medications:  The flagging and interpretation on this report are based on the  following declared medications.  Unexpected results may arise from  inaccuracies in the declared medications.  **Note: The testing scope of this panel includes these medications:  Oxycodone (Percocet)  **Note: The testing scope of this panel does not include following  reported medications:  Acetaminophen (Percocet)  Acetaminophen (Tylenol)  Alendronate (Fosamax)  Amlodipine (Norvasc)  Bimatoprost (Lumigan)  Bromfenac (Prolensa)  Cholecalciferol  Cyclobenzaprine (Flexeril)  Estrogen (Premarin)  Eye Drops  Fluticasone (Flonase)  Metformin  Omeprazole  Pravastatin (Pravachol)  Quinapril  Ranitidine (Zantac) ==================================================================== For clinical consultation, please call [phone]. ====================================================================     Outpatient Medications Prior to Visit  Medication Sig Dispense Refill  . acetaminophen (TYLENOL) 500 MG tablet Take 1 tablet (500 mg total) by mouth 3 (three) times daily. (Patient taking differently: Take 500 mg by mouth as needed. ) 90 tablet 0  . alendronate (FOSAMAX) 70 MG tablet TAKE 1 TABLET WEEKLY 4 tablet 11  . amLODipine (NORVASC) 10 MG tablet Take 1 tablet (10 mg total) by mouth daily. 90 tablet 1  . bimatoprost (LUMIGAN) 0.01 % SOLN Place 1 drop into both eyes daily.     . brimonidine-timolol (COMBIGAN) 0.2-0.5 % ophthalmic solution Place 1 drop into both eyes See admin  instructions. 1 drop into both eyes in the morning, then 1 drop into both eyes 8 hours later    . conjugated estrogens (PREMARIN) vaginal cream Place 1 Applicatorful vaginally daily. 42.5 g 5  . DUREZOL 0.05 % EMUL Place 1 drop into both eyes 4 (four) times daily.     . fluticasone (FLONASE) 50 MCG/ACT nasal spray Place 2 sprays into both nostrils daily.     [name] glucose blood test strip Use as instructed 100 each 12  . metFORMIN (GLUCOPHAGE) 500 MG tablet Take 1 tablet (500 mg total) by mouth 2 (two) times daily with a meal. 180 tablet 1  . omeprazole (PRILOSEC) 20 MG capsule Take 1 capsule (20 mg total) by mouth daily. 90 capsule 1  . ONETOUCH DELICA LANCETS FINE MISC USE ONCE DAILY  TO CHECK BLOOD SUGAR 100 each 0  . pravastatin (PRAVACHOL) 40 MG tablet Take 1 tablet (40 mg total) by mouth at bedtime. 90 tablet 1  . quinapril (ACCUPRIL) 40 MG tablet Take 1 tablet (40 mg total) by mouth daily. 90 tablet 1  . ranitidine (ZANTAC) 150 MG tablet Take 1 tablet (150 mg total) by mouth 2 (two) times daily. Try to take this in place of Omeprazole 180 tablet 0  . oxyCODONE-acetaminophen (PERCOCET) 7.5-325 MG tablet Take 1 tablet by mouth 3 (three) times daily. 90 tablet 0  . brimonidine-timolol (COMBIGAN) 0.2-0.5 % ophthalmic solution     . Bromfenac Sodium 0.09 % SOLN APPLY 1 DROP INTO AFFECTED EYE DAILY  1  . Bromfenac Sodium 0.09 % SOLN APPLY 1 DROP INTO AFFECTED EYE DAILY    . Cholecalciferol (VITAMIN D-1000 MAX ST) 1000 UNITS tablet Take 1,000 Units by mouth daily. Reported on 08/05/2015    . cyclobenzaprine (FLEXERIL) 5 MG tablet Take 5 mg by mouth 3 (three) times daily.    [name] PROLENSA 0.07 % SOLN Reported on 08/11/2015  1   No facility-administered medications prior to visit.    Lab Results  Component Value Date   GLUCOSE 160 (H) 08/09/2015   CHOL 127 08/09/2015   TRIG 105 08/09/2015   HDL 47 08/09/2015   LDLCALC 59 08/09/2015   ALT 6 08/09/2015   AST 9 08/09/2015   NA 141 08/09/2015   K 5.2  08/09/2015   CL 105 08/09/2015   CREATININE 1.38 (H) 08/09/2015   BUN 22 08/09/2015   CO2 22 08/09/2015   HGBA1C 8.2 12/12/2015   MICROALBUR 100 12/12/2015    --------------------------------------------------------------------------------------------------------------------- Dg Chest 2 View  Result Date: 08/11/2015 CLINICAL DATA:  Acute onset of right-sided arm pain, radiating to the right shoulder. Initial encounter. EXAM: CHEST  2 VIEW COMPARISON:  Chest radiograph performed 06/11/2012 FINDINGS: The lungs are well-aerated and clear. There is no evidence of focal opacification, pleural effusion or pneumothorax. The heart is borderline enlarged. No acute osseous abnormalities are seen. Clips are noted within the right upper quadrant, reflecting prior cholecystectomy. IMPRESSION: Borderline cardiomegaly, though the superior mediastinum is normal in appearance. The thoracic aorta is grossly unremarkable in appearance. No acute cardiopulmonary process seen. Electronically Signed   By: Garald Balding M.D.   On: 08/11/2015 23:19       ---------------------------------------------------------------------------------------------------------------------- Past Medical History:  Diagnosis Date  . Abnormal mammogram, unspecified 2013  . Breast screening, unspecified 2013  . Diabetes mellitus without complication ([place]) [id number]   non insulin dependent  . GERD (gastroesophageal reflux disease)   . Glaucoma 2003  . Gout   . Hyperlipidemia 2008  . Hypertension 1980's  . Lump or mass in breast 01/03/2012   left breast  . Obesity, unspecified 2013  . Osteoporosis   . Shingles 2013  . Special screening for malignant neoplasms, colon 2013    Past Surgical History:  Procedure Laterality Date  . ABDOMINAL HYSTERECTOMY  1958   menorrhagia  . BACK SURGERY    . CATARACT EXTRACTION, BILATERAL Bilateral    1st 06/07/15 and the 2nd 06/21/15  . CHOLECYSTECTOMY    . COLONOSCOPY  2003   [facility]  . CORONARY  ARTERY BYPASS GRAFT Left 01/03/2012,01/24/2012   Left FNA, Left breast [facility] bx  . KNEE SURGERY  2009,2011,2012   twice on right and once on left  . KNEE SURGERY    . LIPOMA EXCISION  1998  . [place]  2004  .  TONSILLECTOMY     age of 75    Family History  Problem Relation Age of Onset  . Cancer Mother     skin cancer  . Stroke Mother   . Heart disease Father     Social History  Substance Use Topics  . Smoking status: Never Smoker  . Smokeless tobacco: Never Used  . Alcohol use No    ---------------------------------------------------------------------------------------------------------------------- Social History   Social History  . Marital status: Divorced    Spouse name: N/A  . Number of children: N/A  . Years of education: N/A   Social History Main Topics  . Smoking status: Never Smoker  . Smokeless tobacco: Never Used  . Alcohol use No  . Drug use: No  . Sexual activity: No   Other Topics Concern  . None   Social History Narrative  . None    Scheduled Meds: Continuous Infusions: PRN Meds:.   BP (!) 188/63 (BP Location: Left Arm, Patient Position: Sitting, Cuff Size: Normal)   Pulse (!) 56   Temp 98 F (36.7 C) (Oral)   Resp 16   Ht 5\' 1"  (1.549 m)   Wt 210 lb (95.3 kg)   SpO2 100%   BMI 39.68 kg/m    BP Readings from Last 3 Encounters:  03/27/16 (!) 188/63  02/29/16 (!) 174/67  01/31/16 (!) 146/63     Wt Readings from Last 3 Encounters:  03/27/16 210 lb (95.3 kg)  02/29/16 268 lb (121.6 kg)  01/31/16 268 lb (121.6 kg)     ----------------------------------------------------------------------------------------------------------------------  ROS Review of Systems  No interval change noted  GI: No constipation reported   Objective:  BP (!) 188/63 (BP Location: Left Arm, Patient Position: Sitting, Cuff Size: Normal)   Pulse (!) 56   Temp 98 F (36.7 C) (Oral)   Resp 16   Ht 5\' 1"  (1.549 m)   Wt 210 lb (95.3 kg)    SpO2 100%   BMI 39.68 kg/m   Physical Exam  Pupils are equally round reactive to light extraocular muscles intact Heart is regular rate and rhythm without murmur Lungs are clear to Auscultation Her exam remained stable Without change on examination today. She has some mild paraspinous muscle tenderness worse on the right side.   Assessment & Plan:   Tapanga was seen today for arm pain and knee pain.  Diagnoses and all orders for this visit:  Failed back syndrome of lumbar spine  Low back derangement syndrome  Pain in joint of left shoulder  Chronic pain syndrome  Chronic sciatica, right  Other orders -     Discontinue: oxyCODONE-acetaminophen (PERCOCET) 7.5-325 MG tablet; Take 1 tablet by mouth 3 (three) times daily. -     oxyCODONE-acetaminophen (PERCOCET) 7.5-325 MG tablet; Take 1 tablet by mouth 3 (three) times daily.     ----------------------------------------------------------------------------------------------------------------------  Problem List Items Addressed This Visit      Other   Chronic pain   Relevant Medications   oxyCODONE-acetaminophen (PERCOCET) 7.5-325 MG tablet    Other Visit Diagnoses    Failed back syndrome of lumbar spine    -  Primary   Low back derangement syndrome       Relevant Medications   oxyCODONE-acetaminophen (PERCOCET) 7.5-325 MG tablet   Pain in joint of left shoulder       Chronic sciatica, right          ----------------------------------------------------------------------------------------------------------------------  1. Failed back syndrome of lumbar spine I will refill her medications for the next  2 months with return to clinic in 2 months.   2. Sciatica associated with disorder of lumbar spine, right We have talked about options for a possible epidural steroid injection should her radicular right side pain worsen. 3. Low back derangement  syndrome     ----------------------------------------------------------------------------------------------------------------------  I am having Ms. Vanduzer maintain her bimatoprost, Cholecalciferol, fluticasone, glucose blood, conjugated estrogens, ONETOUCH DELICA LANCETS FINE, alendronate, DUREZOL, PROLENSA, omeprazole, brimonidine-timolol, ranitidine, acetaminophen, pravastatin, quinapril, amLODipine, metFORMIN, cyclobenzaprine, Bromfenac Sodium, Bromfenac Sodium, brimonidine-timolol, ketorolac, and oxyCODONE-acetaminophen.   Meds ordered this encounter  Medications  . ketorolac (ACULAR) 0.5 % ophthalmic solution  . DISCONTD: oxyCODONE-acetaminophen (PERCOCET) 7.5-325 MG tablet    Sig: Take 1 tablet by mouth 3 (three) times daily.    Dispense:  90 tablet    Refill:  0    Do not fill until [id number]  . oxyCODONE-acetaminophen (PERCOCET) 7.5-325 MG tablet    Sig: Take 1 tablet by mouth 3 (three) times daily.    Dispense:  90 tablet    Refill:  0    Do not fill until [id number]       Follow-up: Return in about 2 months (around 05/25/2016) for evaluation, med refill.    [name], MD  This dictation was performed utilizing Dragon voice recognition software.  Please excuse any unintentional or mistaken typographical errors as a result of its unedited utilization.

## 2016-03-29 ENCOUNTER — Other Ambulatory Visit: Payer: Self-pay

## 2016-03-29 MED ORDER — OMEGA-3-ACID ETHYL ESTERS 1 G PO CAPS: 1.0000 g | ORAL_CAPSULE | Freq: Two times a day (BID) | ORAL | 0 refills | Status: DC

## 2016-03-29 MED ORDER — LIDOCAINE 5 % EX OINT: 1.0000 "application " | TOPICAL_OINTMENT | CUTANEOUS | 0 refills | Status: DC | PRN

## 2016-03-29 NOTE — Telephone Encounter (Addendum)
Patient requesting refill of Lidocaine Ointment and Lovaza to [facility].

## 2016-04-11 ENCOUNTER — Other Ambulatory Visit: Payer: Self-pay

## 2016-04-11 MED ORDER — ONETOUCH DELICA LANCETS FINE MISC: 1.0000 | Freq: Every day | 1 refills | Status: DC

## 2016-04-16 ENCOUNTER — Ambulatory Visit (INDEPENDENT_AMBULATORY_CARE_PROVIDER_SITE_OTHER): Payer: Medicare Other | Admitting: Family Medicine

## 2016-04-16 ENCOUNTER — Encounter: Payer: Self-pay | Admitting: Family Medicine

## 2016-04-16 VITALS — BP 126/66 | HR 69 | Temp 97.8°F | Resp 16 | Ht 61.0 in | Wt 208.6 lb

## 2016-04-16 DIAGNOSIS — E1129 Type 2 diabetes mellitus with other diabetic kidney complication: Secondary | ICD-10-CM | POA: Diagnosis not present

## 2016-04-16 DIAGNOSIS — I10 Essential (primary) hypertension: Secondary | ICD-10-CM | POA: Diagnosis not present

## 2016-04-16 DIAGNOSIS — E1165 Type 2 diabetes mellitus with hyperglycemia: Secondary | ICD-10-CM

## 2016-04-16 DIAGNOSIS — IMO0002 Reserved for concepts with insufficient information to code with codable children: Secondary | ICD-10-CM

## 2016-04-16 DIAGNOSIS — R21 Rash and other nonspecific skin eruption: Secondary | ICD-10-CM | POA: Diagnosis not present

## 2016-04-16 DIAGNOSIS — E785 Hyperlipidemia, unspecified: Secondary | ICD-10-CM | POA: Diagnosis not present

## 2016-04-16 DIAGNOSIS — E1169 Type 2 diabetes mellitus with other specified complication: Secondary | ICD-10-CM | POA: Diagnosis not present

## 2016-04-16 DIAGNOSIS — M545 Low back pain, unspecified: Secondary | ICD-10-CM

## 2016-04-16 DIAGNOSIS — G8929 Other chronic pain: Secondary | ICD-10-CM | POA: Insufficient documentation

## 2016-04-16 DIAGNOSIS — M25561 Pain in right knee: Secondary | ICD-10-CM | POA: Diagnosis not present

## 2016-04-16 DIAGNOSIS — R809 Proteinuria, unspecified: Secondary | ICD-10-CM | POA: Diagnosis not present

## 2016-04-16 DIAGNOSIS — E7800 Pure hypercholesterolemia, unspecified: Secondary | ICD-10-CM | POA: Diagnosis not present

## 2016-04-16 LAB — POCT GLYCOSYLATED HEMOGLOBIN (HGB A1C): Hemoglobin A1C: 7.7

## 2016-04-16 MED ORDER — KETOCONAZOLE 2 % EX CREA: 1.0000 "application " | TOPICAL_CREAM | Freq: Every day | CUTANEOUS | 0 refills | Status: DC

## 2016-04-16 MED ORDER — METFORMIN HCL 500 MG PO TABS: 500.0000 mg | ORAL_TABLET | Freq: Two times a day (BID) | ORAL | 1 refills | Status: DC

## 2016-04-16 MED ORDER — FLUCONAZOLE 150 MG PO TABS: 150.0000 mg | ORAL_TABLET | ORAL | 0 refills | Status: DC

## 2016-04-16 MED ORDER — QUINAPRIL HCL 40 MG PO TABS: 40.0000 mg | ORAL_TABLET | Freq: Every day | ORAL | 1 refills | Status: DC

## 2016-04-16 MED ORDER — AMLODIPINE BESYLATE 10 MG PO TABS: 10.0000 mg | ORAL_TABLET | Freq: Every day | ORAL | 1 refills | Status: DC

## 2016-04-16 MED ORDER — PRAVASTATIN SODIUM 40 MG PO TABS: 40.0000 mg | ORAL_TABLET | Freq: Every day | ORAL | 1 refills | Status: DC

## 2016-04-16 NOTE — Progress Notes (Signed)
Name: Taylor [name]   MRN: [id number]    DOB: November 30, 1932   Date:04/16/2016       Progress Note  Subjective  Chief Complaint  Chief Complaint  Patient presents with  . Diabetes    Checks occasionally, Highest 161, Lowest 114  . Pain  . Hyperlipidemia  . Hypertension  . Medication Refill    4 month F/U    HPI  HTN: bp is at goal today, she denies side effects of medication.  She denies chest pain or  SOB.  Hyperlipidemia: she is taking pravastatin, no decrease in exercise tolerance or SOB, no myalgias   DM: glucose 114-161  She denies polyphagia, polydipsia or polyuria. Eye exam is up to date, foot exam done by Dr. Cleda Mccreedy She has microalbuminuria and is taking ACE. She also has dyslipidemia with low HDL, but LDL is at goal on statin therapy.   GERD: she was  taking Omeprazole daily , she is now taking Ranitidine prn and is doing well, no heartburn or regurgitation at this time  OA: she has intermittent low back pain, also daily right knee pain, and some other joint aches, explained the risk of narcotics,she goes to pain clinic, Dr. Andree Elk. Discussed referral to Ortho but she states she does not want to have another surgery  Rash on face: going on for over one year, she states it clears at times, seen by [facility] Dermatolologist in the past.   Patient Active Problem List   Diagnosis Date Noted  . Dyslipidemia associated with type 2 diabetes mellitus ([place]) 04/16/2016  . Chronic midline low back pain without sciatica 04/16/2016  . Chronic pain of right knee 04/16/2016  . Vaginal dryness 02/22/2015  . Calculus of kidney 11/10/2014  . Osteoarthritis, multiple sites 09/09/2014  . Type 2 diabetes mellitus with microalbuminuria ([place]) 09/09/2014  . Glaucoma 09/09/2014  . BP (high blood pressure) 09/09/2014  . Chronic pain 09/09/2014  . Tinea corporis 09/09/2014  . Umbilical hernia 123XX123  . Lump or mass in breast     Past Surgical History:  Procedure Laterality Date  .  ABDOMINAL HYSTERECTOMY  1958   menorrhagia  . BACK SURGERY    . CATARACT EXTRACTION, BILATERAL Bilateral    1st 06/07/15 and the 2nd 06/21/15  . CHOLECYSTECTOMY    . COLONOSCOPY  2003   [facility]  . CORONARY ARTERY BYPASS GRAFT Left 01/03/2012,01/24/2012   Left FNA, Left breast [facility] bx  . KNEE SURGERY  2009,2011,2012   twice on right and once on left  . KNEE SURGERY    . LIPOMA EXCISION  1998  . [place]  2004  . TONSILLECTOMY     age of 47    Family History  Problem Relation Age of Onset  . Cancer Mother     skin cancer  . Stroke Mother   . Heart disease Father     Social History   Social History  . Marital status: Divorced    Spouse name: N/A  . Number of children: N/A  . Years of education: N/A   Occupational History  . Not on file.   Social History Main Topics  . Smoking status: Never Smoker  . Smokeless tobacco: Never Used  . Alcohol use No  . Drug use: No  . Sexual activity: No   Other Topics Concern  . Not on file   Social History Narrative  . No narrative on file     Current Outpatient Prescriptions:  .  acetaminophen (TYLENOL)  500 MG tablet, Take 1 tablet (500 mg total) by mouth 3 (three) times daily. (Patient taking differently: Take 500 mg by mouth as needed. ), Disp: 90 tablet, Rfl: 0 .  alendronate (FOSAMAX) 70 MG tablet, TAKE 1 TABLET WEEKLY, Disp: 4 tablet, Rfl: 11 .  amLODipine (NORVASC) 10 MG tablet, Take 1 tablet (10 mg total) by mouth daily., Disp: 90 tablet, Rfl: 1 .  bimatoprost (LUMIGAN) 0.01 % SOLN, Place 1 drop into both eyes daily. , Disp: , Rfl:  .  brimonidine-timolol (COMBIGAN) 0.2-0.5 % ophthalmic solution, Place 1 drop into both eyes See admin instructions. 1 drop into both eyes in the morning, then 1 drop into both eyes 8 hours later, Disp: , Rfl:  .  Bromfenac Sodium 0.09 % SOLN, APPLY 1 DROP INTO AFFECTED EYE DAILY, Disp: , Rfl: 1 .  Cholecalciferol (VITAMIN D-1000 MAX ST) 1000 UNITS tablet, Take 1,000 Units by mouth daily.  Reported on 08/05/2015, Disp: , Rfl:  .  conjugated estrogens (PREMARIN) vaginal cream, Place 1 Applicatorful vaginally daily., Disp: 42.5 g, Rfl: 5 .  cyclobenzaprine (FLEXERIL) 5 MG tablet, Take 5 mg by mouth 3 (three) times daily., Disp: , Rfl:  .  DUREZOL 0.05 % EMUL, Place 1 drop into both eyes 4 (four) times daily. , Disp: , Rfl:  .  fluticasone (FLONASE) 50 MCG/ACT nasal spray, Place 2 sprays into both nostrils daily. , Disp: , Rfl:  .  glucose blood test strip, Use as instructed, Disp: 100 each, Rfl: 12 .  ketorolac (ACULAR) 0.5 % ophthalmic solution, , Disp: , Rfl:  .  lidocaine (XYLOCAINE) 5 % ointment, Apply 1 application topically as needed., Disp: 35.44 g, Rfl: 0 .  metFORMIN (GLUCOPHAGE) 500 MG tablet, Take 1 tablet (500 mg total) by mouth 2 (two) times daily with a meal., Disp: 180 tablet, Rfl: 1 .  omega-3 acid ethyl esters (LOVAZA) 1 g capsule, Take 1 capsule (1 g total) by mouth 2 (two) times daily., Disp: 360 capsule, Rfl: 0 .  omeprazole (PRILOSEC) 20 MG capsule, Take 1 capsule (20 mg total) by mouth daily., Disp: 90 capsule, Rfl: 1 .  ONETOUCH DELICA LANCETS FINE MISC, 1 each by Does not apply route daily. 30 G Lancets- One Touch Delica, Disp: 123XX123 each, Rfl: 1 .  oxyCODONE-acetaminophen (PERCOCET) 7.5-325 MG tablet, Take 1 tablet by mouth 3 (three) times daily., Disp: 90 tablet, Rfl: 0 .  pravastatin (PRAVACHOL) 40 MG tablet, Take 1 tablet (40 mg total) by mouth at bedtime., Disp: 90 tablet, Rfl: 1 .  quinapril (ACCUPRIL) 40 MG tablet, Take 1 tablet (40 mg total) by mouth daily., Disp: 90 tablet, Rfl: 1 .  ranitidine (ZANTAC) 150 MG tablet, Take 1 tablet (150 mg total) by mouth 2 (two) times daily. Try to take this in place of Omeprazole, Disp: 180 tablet, Rfl: 0  No Known Allergies   ROS  Constitutional: Negative for fever or significant weight change.  Respiratory: Negative for cough and shortness of breath.   Cardiovascular: Negative for chest pain or palpitations.   Gastrointestinal: Negative for abdominal pain, no bowel changes.  Musculoskeletal: Positive  for gait problem and  joint swelling.  Skin: Positive  for rash.  Neurological: Negative for dizziness or headache.  No other specific complaints in a complete review of systems (except as listed in HPI above).  Objective  Vitals:   04/16/16 1049  BP: 126/66  Pulse: 69  Resp: 16  Temp: 97.8 F (36.6 C)  TempSrc: Oral  SpO2:  96%  Weight: 208 lb 9 oz (94.6 kg)  Height: 5\' 1"  (1.549 m)    Body mass index is 39.41 kg/m.  Physical Exam  Constitutional: Patient appears well-developed and well-nourished. Obese  No distress.  HEENT: head atraumatic, normocephalic, pupils equal and reactive to light, neck supple, throat within normal limits Cardiovascular: Normal rate, regular rhythm and normal heart sounds.  No murmur heard. No BLE edema. Pulmonary/Chest: Effort normal and breath sounds normal. No respiratory distress. Abdominal: Soft.  There is no tenderness. Psychiatric: Patient has a normal mood and affect. behavior is normal. Judgment and thought content normal. Muscular skeletal: uses cane, pain with rom of right knee Skin: hypopigmentation and scaly area on right lower jaw.   Recent Results (from the past 2160 hour(s))  ToxASSURE Select 13 (MW), Urine     Status: None   Collection Time: 01/31/16  2:49 PM  Result Value Ref Range   ToxAssure Select 13 FINAL     Comment: ==================================================================== TOXASSURE SELECT 13 (MW) ==================================================================== Test                             Result       Flag       Units Drug Present and Declared for Prescription Verification   Oxycodone                      528          EXPECTED   ng/mg creat   Oxymorphone                    596          EXPECTED   ng/mg creat   Noroxycodone                   699          EXPECTED   ng/mg creat   Noroxymorphone                  240          EXPECTED   ng/mg creat    Sources of oxycodone are scheduled prescription medications.    Oxymorphone, noroxycodone, and noroxymorphone are expected    metabolites of oxycodone. Oxymorphone is also available as a    scheduled prescription medication. ==================================================================== Test                      Result    Flag   Units      Ref Range   Creatinine              126              mg/dL      >=20 ====== ============================================================== Declared Medications:  The flagging and interpretation on this report are based on the  following declared medications.  Unexpected results may arise from  inaccuracies in the declared medications.  **Note: The testing scope of this panel includes these medications:  Oxycodone (Percocet)  **Note: The testing scope of this panel does not include following  reported medications:  Acetaminophen (Percocet)  Acetaminophen (Tylenol)  Alendronate (Fosamax)  Amlodipine (Norvasc)  Bimatoprost (Lumigan)  Bromfenac (Prolensa)  Cholecalciferol  Cyclobenzaprine (Flexeril)  Estrogen (Premarin)  Eye Drops  Fluticasone (Flonase)  Metformin  Omeprazole  Pravastatin (Pravachol)  Quinapril  Ranitidine (Zantac) ==================================================================== For clinical consultation, please call [phone]. ====================================================================  POCT HgB A1C     Status: Abnormal   Collection Time: 04/16/16 11:02 AM  Result Value Ref Range   Hemoglobin A1C 7.7       PHQ2/9: Depression screen [facility] 2/9 04/16/2016 03/27/2016 02/29/2016 01/31/2016 01/03/2016  Decreased Interest 0 0 0 0 0  Down, Depressed, Hopeless 0 0 0 0 0  PHQ - 2 Score 0 0 0 0 0     Fall Risk: Fall Risk  04/16/2016 03/27/2016 02/29/2016 01/31/2016 01/03/2016  Falls in the past year? No No No No No  Number falls in past yr: - - - - -  Injury with  Fall? - - - - -     Functional Status Survey: Is the patient deaf or have difficulty hearing?: Yes Does the patient have difficulty seeing, even when wearing glasses/contacts?: No Does the patient have difficulty concentrating, remembering, or making decisions?: No Does the patient have difficulty walking or climbing stairs?: Yes Does the patient have difficulty dressing or bathing?: No Does the patient have difficulty doing errands alone such as visiting a doctor's office or shopping?: Yes   Assessment & Plan  1. Uncontrolled type 2 diabetes mellitus with microalbuminuria, without long-term current use of insulin ([place])  Doing better, closer to goal. Continue medication and hard work  - POCT HgB A1C - metFORMIN (GLUCOPHAGE) 500 MG tablet; Take 1 tablet (500 mg total) by mouth 2 (two) times daily with a meal.  Dispense: 180 tablet; Refill: 1  2. Essential hypertension  At goal  - quinapril (ACCUPRIL) 40 MG tablet; Take 1 tablet (40 mg total) by mouth daily.  Dispense: 90 tablet; Refill: 1 - amLODipine (NORVASC) 10 MG tablet; Take 1 tablet (10 mg total) by mouth daily.  Dispense: 90 tablet; Refill: 1  3. Dyslipidemia associated with type 2 diabetes mellitus ([place])  Continue statin therapy   4. Chronic pain of right knee  Had two knee surgeries, sees dr. Andree Elk but still affects his sleep  5. Chronic midline low back pain without sciatica  Stable on medication   6. Pure hypercholesterolemia  - pravastatin (PRAVACHOL) 40 MG tablet; Take 1 tablet (40 mg total) by mouth at bedtime.  Dispense: 90 tablet; Refill: 1  7. Rash of face  Advised to call me back if no improvement, avoid contact with mouth or eye. Had Lamisil in the past and referral to Dermatologist that at the time states post-inflammatory, patient states rash is back  - fluconazole (DIFLUCAN) 150 MG tablet; Take 1 tablet (150 mg total) by mouth every other day.  Dispense: 3 tablet; Refill: 0 - ketoconazole (NIZORAL)  2 % cream; Apply 1 application topically daily.  Dispense: 15 g; Refill: 0

## 2016-04-19 ENCOUNTER — Other Ambulatory Visit: Payer: Self-pay

## 2016-04-19 MED ORDER — LIDOCAINE 5 % EX OINT: 1.0000 "application " | TOPICAL_OINTMENT | CUTANEOUS | 0 refills | Status: DC | PRN

## 2016-04-19 MED ORDER — OMEGA-3-ACID ETHYL ESTERS 1 G PO CAPS: 1.0000 g | ORAL_CAPSULE | Freq: Two times a day (BID) | ORAL | 0 refills | Status: DC

## 2016-04-19 NOTE — Telephone Encounter (Signed)
Patient requesting refill of Lidocaine and Lovaza to [facility].

## 2016-04-23 ENCOUNTER — Telehealth: Payer: Self-pay | Admitting: Family Medicine

## 2016-04-23 ENCOUNTER — Encounter: Payer: Self-pay | Admitting: Family Medicine

## 2016-04-23 ENCOUNTER — Ambulatory Visit (INDEPENDENT_AMBULATORY_CARE_PROVIDER_SITE_OTHER): Payer: Medicare Other | Admitting: Family Medicine

## 2016-04-23 VITALS — BP 138/78 | HR 77 | Temp 98.5°F | Resp 18 | Ht 61.0 in | Wt 199.9 lb

## 2016-04-23 DIAGNOSIS — E1129 Type 2 diabetes mellitus with other diabetic kidney complication: Secondary | ICD-10-CM

## 2016-04-23 DIAGNOSIS — R809 Proteinuria, unspecified: Secondary | ICD-10-CM

## 2016-04-23 DIAGNOSIS — IMO0002 Reserved for concepts with insufficient information to code with codable children: Secondary | ICD-10-CM

## 2016-04-23 DIAGNOSIS — E1165 Type 2 diabetes mellitus with hyperglycemia: Secondary | ICD-10-CM | POA: Diagnosis not present

## 2016-04-23 DIAGNOSIS — J209 Acute bronchitis, unspecified: Secondary | ICD-10-CM | POA: Diagnosis not present

## 2016-04-23 MED ORDER — BENZONATATE 100 MG PO CAPS: 100.0000 mg | ORAL_CAPSULE | Freq: Three times a day (TID) | ORAL | 0 refills | Status: DC | PRN

## 2016-04-23 MED ORDER — AZITHROMYCIN 250 MG PO TABS: ORAL_TABLET | ORAL | 0 refills | Status: DC

## 2016-04-23 MED ORDER — FLUTICASONE FUROATE-VILANTEROL 100-25 MCG/INH IN AEPB: 1.0000 | INHALATION_SPRAY | Freq: Every day | RESPIRATORY_TRACT | 0 refills | Status: DC

## 2016-04-23 NOTE — Progress Notes (Signed)
Name: Taylor [name]   MRN: [id number]    DOB: 06/10/1932   Date:04/23/2016       Progress Note  Subjective  Chief Complaint  Chief Complaint  Patient presents with  . Fever    Onset-Friday, coughing, wheezing  . Gait Problem    Has been unstable    HPI  Bronchitis: she states that since Friday she has been having ear pain, fatigue, decrease in appetite, nasal congestion , rhinorrhea and scratchy throat. Cough has been dry, and has noticed wheezing and SOB with activity. She had chills, and temperature was 101.5 and took two Tylenols, but did not take antipyretic today.  It may have been the flu, but no fever today and is getting better so we will not rx Tamiflu at this time. She has DM and is older, other differential diagnosis includes CAP and bronchitis. She has DM, but states she has not checked her glucose over the past couple of days.    Patient Active Problem List   Diagnosis Date Noted  . Dyslipidemia associated with type 2 diabetes mellitus ([place]) 04/16/2016  . Chronic midline low back pain without sciatica 04/16/2016  . Chronic pain of right knee 04/16/2016  . Vaginal dryness 02/22/2015  . Calculus of kidney 11/10/2014  . Osteoarthritis, multiple sites 09/09/2014  . Type 2 diabetes mellitus with microalbuminuria ([place]) 09/09/2014  . Glaucoma 09/09/2014  . BP (high blood pressure) 09/09/2014  . Chronic pain 09/09/2014  . Tinea corporis 09/09/2014  . Umbilical hernia [id number]  . Lump or mass in breast     Past Surgical History:  Procedure Laterality Date  . ABDOMINAL HYSTERECTOMY  1958   menorrhagia  . BACK SURGERY    . CATARACT EXTRACTION, BILATERAL Bilateral    1st 06/07/15 and the 2nd 06/21/15  . CHOLECYSTECTOMY    . COLONOSCOPY  2003   [facility]  . CORONARY ARTERY BYPASS GRAFT Left 01/03/2012,01/24/2012   Left FNA, Left breast [facility] bx  . KNEE SURGERY  2009,2011,2012   twice on right and once on left  . KNEE SURGERY    . LIPOMA EXCISION  1998  . [place]   2004  . TONSILLECTOMY     age of 89    Family History  Problem Relation Age of Onset  . Cancer Mother     skin cancer  . Stroke Mother   . Heart disease Father     Social History   Social History  . Marital status: Divorced    Spouse name: N/A  . Number of children: N/A  . Years of education: N/A   Occupational History  . Not on file.   Social History Main Topics  . Smoking status: Never Smoker  . Smokeless tobacco: Never Used  . Alcohol use No  . Drug use: No  . Sexual activity: No   Other Topics Concern  . Not on file   Social History Narrative  . No narrative on file     Current Outpatient Prescriptions:  .  acetaminophen (TYLENOL) 500 MG tablet, Take 1 tablet (500 mg total) by mouth 3 (three) times daily. (Patient taking differently: Take 500 mg by mouth as needed. ), Disp: 90 tablet, Rfl: 0 .  alendronate (FOSAMAX) 70 MG tablet, TAKE 1 TABLET WEEKLY, Disp: 4 tablet, Rfl: 11 .  amLODipine (NORVASC) 10 MG tablet, Take 1 tablet (10 mg total) by mouth daily., Disp: 90 tablet, Rfl: 1 .  bimatoprost (LUMIGAN) 0.01 % SOLN, Place 1 drop into both  eyes daily. , Disp: , Rfl:  .  brimonidine-timolol (COMBIGAN) 0.2-0.5 % ophthalmic solution, Place 1 drop into both eyes See admin instructions. 1 drop into both eyes in the morning, then 1 drop into both eyes 8 hours later, Disp: , Rfl:  .  Bromfenac Sodium 0.09 % SOLN, APPLY 1 DROP INTO AFFECTED EYE DAILY, Disp: , Rfl: 1 .  Cholecalciferol (VITAMIN D-1000 MAX ST) 1000 UNITS tablet, Take 1,000 Units by mouth daily. Reported on 08/05/2015, Disp: , Rfl:  .  conjugated estrogens (PREMARIN) vaginal cream, Place 1 Applicatorful vaginally daily., Disp: 42.5 g, Rfl: 5 .  cyclobenzaprine (FLEXERIL) 5 MG tablet, Take 5 mg by mouth 3 (three) times daily., Disp: , Rfl:  .  DUREZOL 0.05 % EMUL, Place 1 drop into both eyes 4 (four) times daily. , Disp: , Rfl:  .  fluconazole (DIFLUCAN) 150 MG tablet, Take 1 tablet (150 mg total) by mouth  every other day., Disp: 3 tablet, Rfl: 0 .  fluticasone (FLONASE) 50 MCG/ACT nasal spray, Place 2 sprays into both nostrils daily. , Disp: , Rfl:  .  glucose blood test strip, Use as instructed, Disp: 100 each, Rfl: 12 .  ketoconazole (NIZORAL) 2 % cream, Apply 1 application topically daily., Disp: 15 g, Rfl: 0 .  ketorolac (ACULAR) 0.5 % ophthalmic solution, , Disp: , Rfl:  .  lidocaine (XYLOCAINE) 5 % ointment, Apply 1 application topically as needed., Disp: 35.44 g, Rfl: 0 .  metFORMIN (GLUCOPHAGE) 500 MG tablet, Take 1 tablet (500 mg total) by mouth 2 (two) times daily with a meal., Disp: 180 tablet, Rfl: 1 .  omega-3 acid ethyl esters (LOVAZA) 1 g capsule, Take 1 capsule (1 g total) by mouth 2 (two) times daily., Disp: 360 capsule, Rfl: 0 .  omeprazole (PRILOSEC) 20 MG capsule, Take 1 capsule (20 mg total) by mouth daily., Disp: 90 capsule, Rfl: 1 .  ONETOUCH DELICA LANCETS FINE MISC, 1 each by Does not apply route daily. 30 G Lancets- One Touch Delica, Disp: 123XX123 each, Rfl: 1 .  oxyCODONE-acetaminophen (PERCOCET) 7.5-325 MG tablet, Take 1 tablet by mouth 3 (three) times daily., Disp: 90 tablet, Rfl: 0 .  pravastatin (PRAVACHOL) 40 MG tablet, Take 1 tablet (40 mg total) by mouth at bedtime., Disp: 90 tablet, Rfl: 1 .  quinapril (ACCUPRIL) 40 MG tablet, Take 1 tablet (40 mg total) by mouth daily., Disp: 90 tablet, Rfl: 1 .  ranitidine (ZANTAC) 150 MG tablet, Take 1 tablet (150 mg total) by mouth 2 (two) times daily. Try to take this in place of Omeprazole, Disp: 180 tablet, Rfl: 0  No Known Allergies   ROS  Ten systems reviewed and is negative except as mentioned in HPI   Objective  Vitals:   04/23/16 1310  BP: 138/78  Pulse: 77  Resp: 18  Temp: 98.5 F (36.9 C)  TempSrc: Oral  SpO2: 97%  Weight: 199 lb 14.4 oz (90.7 kg)  Height: 5\' 1"  (1.549 m)    Body mass index is 37.77 kg/m.  Physical Exam  Constitutional: Patient appears well-developed and well-nourished. Obese No  distress.  HEENT: head atraumatic, normocephalic, pupils equal and reactive to light, ears normal bilaterally, neck supple, throat within normal limits Cardiovascular: Normal rate, regular rhythm and normal heart sounds.  No murmur heard. No BLE edema. Pulmonary/Chest: Effort normal and breath sounds normal. No respiratory distress. Abdominal: Soft.  There is no tenderness. Psychiatric: Patient has a normal mood and affect. behavior is normal. Judgment and thought  content normal. Muscular Skeletal: using a cane  Recent Results (from the past 2160 hour(s))  ToxASSURE Select 13 (MW), Urine     Status: None   Collection Time: 01/31/16  2:49 PM  Result Value Ref Range   ToxAssure Select 13 FINAL     Comment: ==================================================================== TOXASSURE SELECT 13 (MW) ==================================================================== Test                             Result       Flag       Units Drug Present and Declared for Prescription Verification   Oxycodone                      528          EXPECTED   ng/mg creat   Oxymorphone                    596          EXPECTED   ng/mg creat   Noroxycodone                   699          EXPECTED   ng/mg creat   Noroxymorphone                 240          EXPECTED   ng/mg creat    Sources of oxycodone are scheduled prescription medications.    Oxymorphone, noroxycodone, and noroxymorphone are expected    metabolites of oxycodone. Oxymorphone is also available as a    scheduled prescription medication. ==================================================================== Test                      Result    Flag   Units      Ref Range   Creatinine              126              mg/dL      >=20 ====== ============================================================== Declared Medications:  The flagging and interpretation on this report are based on the  following declared medications.  Unexpected results may arise  from  inaccuracies in the declared medications.  **Note: The testing scope of this panel includes these medications:  Oxycodone (Percocet)  **Note: The testing scope of this panel does not include following  reported medications:  Acetaminophen (Percocet)  Acetaminophen (Tylenol)  Alendronate (Fosamax)  Amlodipine (Norvasc)  Bimatoprost (Lumigan)  Bromfenac (Prolensa)  Cholecalciferol  Cyclobenzaprine (Flexeril)  Estrogen (Premarin)  Eye Drops  Fluticasone (Flonase)  Metformin  Omeprazole  Pravastatin (Pravachol)  Quinapril  Ranitidine (Zantac) ==================================================================== For clinical consultation, please call [phone]. ====================================================================   POCT HgB A1C     Status: Abnormal   Collection Time: 04/16/16 11:02 AM  Result Value Ref Range   Hemoglobin A1C 7.7      PHQ2/9: Depression screen [facility] 2/9 04/16/2016 03/27/2016 02/29/2016 01/31/2016 01/03/2016  Decreased Interest 0 0 0 0 0  Down, Depressed, Hopeless 0 0 0 0 0  PHQ - 2 Score 0 0 0 0 0    Fall Risk: Fall Risk  04/16/2016 03/27/2016 02/29/2016 01/31/2016 01/03/2016  Falls in the past year? No No No No No  Number falls in past yr: - - - - -  Injury with Fall? - - - - -     Assessment &  Plan   1. Acute bronchitis, unspecified organism  Because of her age and risk factors we will start antibiotics, sample of Breo, avoid oral prednisone because of DM, advised fluids, rest and to go to [facility] if symptoms do not improve - azithromycin (ZITHROMAX) 250 MG tablet; Take as directed  Dispense: 6 tablet; Refill: 0 - fluticasone furoate-vilanterol (BREO ELLIPTA) 100-25 MCG/INH AEPB; Inhale 1 puff into the lungs daily.  Dispense: 60 each; Refill: 0 - benzonatate (TESSALON) 100 MG capsule; Take 1-2 capsules (100-200 mg total) by mouth 3 (three) times daily as needed.  Dispense: 40 capsule; Refill: 0  2. Uncontrolled type 2 diabetes mellitus  with microalbuminuria, without long-term current use of insulin ([facility])  Monitor glucose and make sure to hold Metformin if she develops diarrhea and is unable to keep fluids down, also go to [facility] if glucose spikes.

## 2016-04-23 NOTE — Telephone Encounter (Signed)
done

## 2016-04-23 NOTE — Telephone Encounter (Signed)
Azithromycin, benzonatate, and breo was sent to wrong pharmacy. Please send to [facility]

## 2016-04-24 NOTE — Telephone Encounter (Signed)
Pt informed

## 2016-05-01 ENCOUNTER — Telehealth: Payer: Self-pay | Admitting: Family Medicine

## 2016-05-01 ENCOUNTER — Other Ambulatory Visit: Payer: Self-pay | Admitting: Family Medicine

## 2016-05-01 MED ORDER — LIDOCAINE 5 % EX OINT: 1.0000 "application " | TOPICAL_OINTMENT | Freq: Three times a day (TID) | CUTANEOUS | 0 refills | Status: DC | PRN

## 2016-05-01 NOTE — Telephone Encounter (Signed)
Have question about lidocaine prescription. Please return call [phone] States instructions are not clear

## 2016-05-01 NOTE — Telephone Encounter (Signed)
Please clarify prescription for lidocaine. Thanks

## 2016-05-08 ENCOUNTER — Telehealth: Payer: Self-pay | Admitting: Family Medicine

## 2016-05-08 MED ORDER — LIDOCAINE 5 % EX OINT: 1.0000 "application " | TOPICAL_OINTMENT | Freq: Three times a day (TID) | CUTANEOUS | 0 refills | Status: DC | PRN

## 2016-05-08 NOTE — Telephone Encounter (Signed)
Amy from [place] needing clarification on lidocaine. Would like to know the directions for this script. Need to know how long this script should last and how often or how much should pt apply [phone]

## 2016-05-08 NOTE — Telephone Encounter (Signed)
2 grams every 6 hours prn, one month

## 2016-05-09 DIAGNOSIS — H2013 Chronic iridocyclitis, bilateral: Secondary | ICD-10-CM | POA: Diagnosis not present

## 2016-05-09 DIAGNOSIS — H401132 Primary open-angle glaucoma, bilateral, moderate stage: Secondary | ICD-10-CM | POA: Diagnosis not present

## 2016-05-09 NOTE — Telephone Encounter (Signed)
Informed the pharmacist of directions at [place].

## 2016-05-20 ENCOUNTER — Other Ambulatory Visit: Payer: Self-pay | Admitting: Family Medicine

## 2016-05-20 DIAGNOSIS — J209 Acute bronchitis, unspecified: Secondary | ICD-10-CM

## 2016-05-22 ENCOUNTER — Encounter: Payer: Self-pay | Admitting: Anesthesiology

## 2016-05-22 ENCOUNTER — Ambulatory Visit: Payer: Medicare Other | Attending: Anesthesiology | Admitting: Anesthesiology

## 2016-05-22 VITALS — BP 176/69 | HR 73 | Temp 98.7°F | Resp 18 | Ht 61.5 in | Wt 199.0 lb

## 2016-05-22 DIAGNOSIS — M25561 Pain in right knee: Secondary | ICD-10-CM | POA: Insufficient documentation

## 2016-05-22 DIAGNOSIS — Z7984 Long term (current) use of oral hypoglycemic drugs: Secondary | ICD-10-CM | POA: Insufficient documentation

## 2016-05-22 DIAGNOSIS — E669 Obesity, unspecified: Secondary | ICD-10-CM | POA: Insufficient documentation

## 2016-05-22 DIAGNOSIS — M533 Sacrococcygeal disorders, not elsewhere classified: Secondary | ICD-10-CM | POA: Diagnosis not present

## 2016-05-22 DIAGNOSIS — M5386 Other specified dorsopathies, lumbar region: Secondary | ICD-10-CM

## 2016-05-22 DIAGNOSIS — E785 Hyperlipidemia, unspecified: Secondary | ICD-10-CM | POA: Insufficient documentation

## 2016-05-22 DIAGNOSIS — Z6836 Body mass index (BMI) 36.0-36.9, adult: Secondary | ICD-10-CM | POA: Insufficient documentation

## 2016-05-22 DIAGNOSIS — I10 Essential (primary) hypertension: Secondary | ICD-10-CM | POA: Insufficient documentation

## 2016-05-22 DIAGNOSIS — Z9841 Cataract extraction status, right eye: Secondary | ICD-10-CM | POA: Insufficient documentation

## 2016-05-22 DIAGNOSIS — M961 Postlaminectomy syndrome, not elsewhere classified: Secondary | ICD-10-CM | POA: Diagnosis not present

## 2016-05-22 DIAGNOSIS — E119 Type 2 diabetes mellitus without complications: Secondary | ICD-10-CM | POA: Insufficient documentation

## 2016-05-22 DIAGNOSIS — M109 Gout, unspecified: Secondary | ICD-10-CM | POA: Diagnosis not present

## 2016-05-22 DIAGNOSIS — M79641 Pain in right hand: Secondary | ICD-10-CM | POA: Diagnosis not present

## 2016-05-22 DIAGNOSIS — R200 Anesthesia of skin: Secondary | ICD-10-CM | POA: Insufficient documentation

## 2016-05-22 DIAGNOSIS — Z9842 Cataract extraction status, left eye: Secondary | ICD-10-CM | POA: Diagnosis not present

## 2016-05-22 DIAGNOSIS — K219 Gastro-esophageal reflux disease without esophagitis: Secondary | ICD-10-CM | POA: Diagnosis not present

## 2016-05-22 DIAGNOSIS — Z981 Arthrodesis status: Secondary | ICD-10-CM | POA: Insufficient documentation

## 2016-05-22 DIAGNOSIS — Z8249 Family history of ischemic heart disease and other diseases of the circulatory system: Secondary | ICD-10-CM | POA: Insufficient documentation

## 2016-05-22 DIAGNOSIS — Z951 Presence of aortocoronary bypass graft: Secondary | ICD-10-CM | POA: Diagnosis not present

## 2016-05-22 DIAGNOSIS — M545 Low back pain: Secondary | ICD-10-CM | POA: Diagnosis not present

## 2016-05-22 DIAGNOSIS — Z90710 Acquired absence of both cervix and uterus: Secondary | ICD-10-CM | POA: Insufficient documentation

## 2016-05-22 DIAGNOSIS — M79642 Pain in left hand: Secondary | ICD-10-CM | POA: Diagnosis not present

## 2016-05-22 DIAGNOSIS — Z809 Family history of malignant neoplasm, unspecified: Secondary | ICD-10-CM | POA: Insufficient documentation

## 2016-05-22 DIAGNOSIS — M810 Age-related osteoporosis without current pathological fracture: Secondary | ICD-10-CM | POA: Diagnosis not present

## 2016-05-22 DIAGNOSIS — Z79891 Long term (current) use of opiate analgesic: Secondary | ICD-10-CM | POA: Insufficient documentation

## 2016-05-22 DIAGNOSIS — Z9889 Other specified postprocedural states: Secondary | ICD-10-CM | POA: Insufficient documentation

## 2016-05-22 DIAGNOSIS — M542 Cervicalgia: Secondary | ICD-10-CM | POA: Diagnosis not present

## 2016-05-22 DIAGNOSIS — Z9049 Acquired absence of other specified parts of digestive tract: Secondary | ICD-10-CM | POA: Diagnosis not present

## 2016-05-22 DIAGNOSIS — H409 Unspecified glaucoma: Secondary | ICD-10-CM | POA: Insufficient documentation

## 2016-05-22 DIAGNOSIS — G894 Chronic pain syndrome: Secondary | ICD-10-CM | POA: Diagnosis not present

## 2016-05-22 DIAGNOSIS — Z823 Family history of stroke: Secondary | ICD-10-CM | POA: Insufficient documentation

## 2016-05-22 MED ORDER — OXYCODONE-ACETAMINOPHEN 7.5-325 MG PO TABS: 1.0000 | ORAL_TABLET | Freq: Three times a day (TID) | ORAL | 0 refills | Status: DC

## 2016-05-22 NOTE — Patient Instructions (Signed)
You were given 2 prescriptions for Oxycodone today. Please bring your remaining bottle of Oxycodone to your appointment for pill count.

## 2016-05-23 ENCOUNTER — Ambulatory Visit (INDEPENDENT_AMBULATORY_CARE_PROVIDER_SITE_OTHER): Payer: Medicare Other | Admitting: Ophthalmology

## 2016-05-23 NOTE — Progress Notes (Signed)
Subjective:  Patient ID: Taylor [name], female    DOB: [birth year]  Age: 83 y.o. MRN: [id number]  CC: Knee Pain (right) and Hand Pain (bilateral)   Service Provided on Last Visit: Med Refill, Evaluation  PROCEDURE:None  HPI Taylor [name] was last seen 2 months ago. Taylor [name] presents for reevaluation and seems to be doing reasonably well with her current regimen of medication. The quality characteristic and distribution of her pain have been stable. Taylor [name]'s taking her medications as prescribed and they're working well for her. In review today Taylor [name] denies any significant problems with side effects with the medicines. And based on her narcotic assessment sheet Taylor [name] appears to be doing well. Taylor [name] continues to derive good benefit from the medications based on lifestyle and function.   Taylor [name] is a pleasant 83 year old black female with long-standing history of low back pain. Taylor [name] reports a previous fusion back in 2001 at L5-S1 and this was cooperated with previous x-rays of her low back. Taylor [name]'s had no recent studies that reports a pain that starts in her low back with radiation into the right posterior and anterior leg. Taylor [name] has associated numbness and tingling that is worse with standing affecting the anterior knee and anterior lower leg right side. Furthermore Taylor [name] reports a VAS score of maximum 8 to 10 and averaging a 6 but  not influenced by time of day but aggravated by any type of motion or activity. Alleviating factors include walking resting warm showers or medication management. Taylor [name] has been on Percocet 7.5 mg tablets for an extended period of time and these are the only thing Taylor [name] reports giving her relief. No changes in the quality of her low back pain and lower extremity pain are noted her urine tox screen was negative and Taylor [name] is requesting initiation of opioid therapy today.   Taylor [name] has had a history of some cervical pain as well affecting the right shoulder region and down the right arm into the right fifth  finger however this is reportedly better at this time. This is no longer a current problem for other than some occasional muscle stiffness in the posterior right trapezius region. Taylor [name] denies any weakness in the upper extremities at this time  History Taylor [name] has a past medical history of Abnormal mammogram, unspecified (2013); Breast screening, unspecified (2013); Diabetes mellitus without complication ([place]) (7588); GERD (gastroesophageal reflux disease); Glaucoma (2003); Gout; Hyperlipidemia (2008); Hypertension (1980's); Lump or mass in breast (01/03/2012); Obesity, unspecified (2013); Osteoporosis; Shingles (2013); and Special screening for malignant neoplasms, colon (2013).   Taylor [name] has a past surgical history that includes Abdominal hysterectomy (1958); Knee surgery (2009,2011,2012); Lipoma excision (1998); Spine surgery (2004); Tonsillectomy; Colonoscopy (2003); Coronary artery bypass graft (Left, 01/03/2012,01/24/2012); Cataract extraction, bilateral (Bilateral); Cholecystectomy; Back surgery; and Knee surgery.   Her family history includes Cancer in her mother; Heart disease in her father; Stroke in her mother.Taylor [name] reports that Taylor [name] has never smoked. Taylor [name] has never used smokeless tobacco. Taylor [name] reports that Taylor [name] does not drink alcohol or use drugs.  No results found for this or any previous visit.  [facility] Select 13  Date Value Ref Range Status  01/31/2016 FINAL  Final    Comment:    ==================================================================== [facility] SELECT 13 (MW) ==================================================================== Test                             Result       Flag       Units  Drug Present and Declared for Prescription Verification   Oxycodone                      528          EXPECTED   ng/mg creat   Oxymorphone                    596          EXPECTED   ng/mg creat   Noroxycodone                   699          EXPECTED   ng/mg creat   Noroxymorphone                  240          EXPECTED   ng/mg creat    Sources of oxycodone are scheduled prescription medications.    Oxymorphone, noroxycodone, and noroxymorphone are expected    metabolites of oxycodone. Oxymorphone is also available as a    scheduled prescription medication. ==================================================================== Test                      Result    Flag   Units      Ref Range   Creatinine              126              mg/dL      >=20 ==================================================================== Declared Medications:  The flagging and interpretation on this report are based on the  following declared medications.  Unexpected results may arise from  inaccuracies in the declared medications.  **Note: The testing scope of this panel includes these medications:  Oxycodone (Percocet)  **Note: The testing scope of this panel does not include following  reported medications:  Acetaminophen (Percocet)  Acetaminophen (Tylenol)  Alendronate (Fosamax)  Amlodipine (Norvasc)  Bimatoprost (Lumigan)  Bromfenac (Prolensa)  Cholecalciferol  Cyclobenzaprine (Flexeril)  Estrogen (Premarin)  Eye Drops  Fluticasone (Flonase)  Metformin  Omeprazole  Pravastatin (Pravachol)  Quinapril  Ranitidine (Zantac) ==================================================================== For clinical consultation, please call [phone]. ====================================================================     Outpatient Medications Prior to Visit  Medication Sig Dispense Refill  . acetaminophen (TYLENOL) 500 MG tablet Take 1 tablet (500 mg total) by mouth 3 (three) times daily. (Patient taking differently: Take 500 mg by mouth as needed. ) 90 tablet 0  . alendronate (FOSAMAX) 70 MG tablet TAKE 1 TABLET WEEKLY 4 tablet 11  . amLODipine (NORVASC) 10 MG tablet Take 1 tablet (10 mg total) by mouth daily. 90 tablet 1  . benzonatate (TESSALON) 100 MG capsule Take 1-2 capsules (100-200 mg  total) by mouth 3 (three) times daily as needed. 40 capsule 0  . bimatoprost (LUMIGAN) 0.01 % SOLN Place 1 drop into both eyes daily.     [name] BREO ELLIPTA 100-25 MCG/INH AEPB INHALE 1 PUFF INTO LUNGS DAILY 60 each 0  . brimonidine-timolol (COMBIGAN) 0.2-0.5 % ophthalmic solution Place 1 drop into both eyes See admin instructions. 1 drop into both eyes in the morning, then 1 drop into both eyes 8 hours later    . Bromfenac Sodium 0.09 % SOLN APPLY 1 DROP INTO AFFECTED EYE DAILY  1  . Cholecalciferol (VITAMIN D-1000 MAX ST) 1000 UNITS tablet Take 1,000 Units by mouth daily. Reported on 08/05/2015    . conjugated estrogens (PREMARIN) vaginal cream Place 1 Applicatorful  vaginally daily. 42.5 g 5  . cyclobenzaprine (FLEXERIL) 5 MG tablet Take 5 mg by mouth 3 (three) times daily.    . DUREZOL 0.05 % EMUL Place 1 drop into both eyes 4 (four) times daily.     . fluconazole (DIFLUCAN) 150 MG tablet Take 1 tablet (150 mg total) by mouth every other day. 3 tablet 0  . fluticasone (FLONASE) 50 MCG/ACT nasal spray Place 2 sprays into both nostrils daily.     [name] glucose blood test strip Use as instructed 100 each 12  . ketoconazole (NIZORAL) 2 % cream Apply 1 application topically daily. 15 g 0  . ketorolac (ACULAR) 0.5 % ophthalmic solution     . lidocaine (XYLOCAINE) 5 % ointment Apply 1 application topically 3 (three) times daily as needed. 2 grams 35.44 g 0  . metFORMIN (GLUCOPHAGE) 500 MG tablet Take 1 tablet (500 mg total) by mouth 2 (two) times daily with a meal. 180 tablet 1  . omega-3 acid ethyl esters (LOVAZA) 1 g capsule Take 1 capsule (1 g total) by mouth 2 (two) times daily. 360 capsule 0  . omeprazole (PRILOSEC) 20 MG capsule Take 1 capsule (20 mg total) by mouth daily. 90 capsule 1  . ONETOUCH DELICA LANCETS FINE MISC 1 each by Does not apply route daily. 30 G Lancets- One Touch Delica 790 each 1  . pravastatin (PRAVACHOL) 40 MG tablet Take 1 tablet (40 mg total) by mouth at bedtime. 90 tablet 1  .  quinapril (ACCUPRIL) 40 MG tablet Take 1 tablet (40 mg total) by mouth daily. 90 tablet 1  . ranitidine (ZANTAC) 150 MG tablet Take 1 tablet (150 mg total) by mouth 2 (two) times daily. Try to take this in place of Omeprazole 180 tablet 0  . oxyCODONE-acetaminophen (PERCOCET) 7.5-325 MG tablet Take 1 tablet by mouth 3 (three) times daily. 90 tablet 0   No facility-administered medications prior to visit.    Lab Results  Component Value Date   GLUCOSE 160 (H) 08/09/2015   CHOL 127 08/09/2015   TRIG 105 08/09/2015   HDL 47 08/09/2015   LDLCALC 59 08/09/2015   ALT 6 08/09/2015   AST 9 08/09/2015   NA 141 08/09/2015   K 5.2 08/09/2015   CL 105 08/09/2015   CREATININE 1.38 (H) 08/09/2015   BUN 22 08/09/2015   CO2 22 08/09/2015   HGBA1C 7.7 04/16/2016   MICROALBUR 100 12/12/2015    --------------------------------------------------------------------------------------------------------------------- Dg Chest 2 View  Result Date: 08/11/2015 CLINICAL DATA:  Acute onset of right-sided arm pain, radiating to the right shoulder. Initial encounter. EXAM: CHEST  2 VIEW COMPARISON:  Chest radiograph performed 06/11/2012 FINDINGS: The lungs are well-aerated and clear. There is no evidence of focal opacification, pleural effusion or pneumothorax. The heart is borderline enlarged. No acute osseous abnormalities are seen. Clips are noted within the right upper quadrant, reflecting prior cholecystectomy. IMPRESSION: Borderline cardiomegaly, though the superior mediastinum is normal in appearance. The thoracic aorta is grossly unremarkable in appearance. No acute cardiopulmonary process seen. Electronically Signed   By: Garald Balding M.D.   On: 08/11/2015 23:19       ---------------------------------------------------------------------------------------------------------------------- Past Medical History:  Diagnosis Date  . Abnormal mammogram, unspecified 2013  . Breast screening, unspecified 2013   . Diabetes mellitus without complication ([place]) 2409   non insulin dependent  . GERD (gastroesophageal reflux disease)   . Glaucoma 2003  . Gout   . Hyperlipidemia 2008  . Hypertension 1980's  . Lump or  mass in breast 01/03/2012   left breast  . Obesity, unspecified 2013  . Osteoporosis   . Shingles 2013  . Special screening for malignant neoplasms, colon 2013    Past Surgical History:  Procedure Laterality Date  . ABDOMINAL HYSTERECTOMY  1958   menorrhagia  . BACK SURGERY    . CATARACT EXTRACTION, BILATERAL Bilateral    1st 06/07/15 and the 2nd 06/21/15  . CHOLECYSTECTOMY    . COLONOSCOPY  2003   [facility]  . CORONARY ARTERY BYPASS GRAFT Left 01/03/2012,01/24/2012   Left FNA, Left breast [facility] bx  . KNEE SURGERY  2009,2011,2012   twice on right and once on left  . KNEE SURGERY    . LIPOMA EXCISION  1998  . [place]  2004  . TONSILLECTOMY     age of 75    Family History  Problem Relation Age of Onset  . Cancer Mother     skin cancer  . Stroke Mother   . Heart disease Father     Social History  Substance Use Topics  . Smoking status: Never Smoker  . Smokeless tobacco: Never Used  . Alcohol use No    ---------------------------------------------------------------------------------------------------------------------- Social History   Social History  . Marital status: Divorced    Spouse name: N/A  . Number of children: N/A  . Years of education: N/A   Social History Main Topics  . Smoking status: Never Smoker  . Smokeless tobacco: Never Used  . Alcohol use No  . Drug use: No  . Sexual activity: No   Other Topics Concern  . None   Social History Narrative  . None    Scheduled Meds: Continuous Infusions: PRN Meds:.   BP (!) 176/69   Pulse 73   Temp 98.7 F (37.1 C) (Oral)   Resp 18   Ht 5' 1.5" (1.562 m)   Wt 199 lb (90.3 kg)   SpO2 100%   BMI 36.99 kg/m    BP Readings from Last 3 Encounters:  05/22/16 (!) 176/69  04/23/16 138/78   04/16/16 126/66     Wt Readings from Last 3 Encounters:  05/22/16 199 lb (90.3 kg)  04/23/16 199 lb 14.4 oz (90.7 kg)  04/16/16 208 lb 9 oz (94.6 kg)     ----------------------------------------------------------------------------------------------------------------------  ROS Review of Systems  No interval change noted  GI: No constipation reported   Objective:  BP (!) 176/69   Pulse 73   Temp 98.7 F (37.1 C) (Oral)   Resp 18   Ht 5' 1.5" (1.562 m)   Wt 199 lb (90.3 kg)   SpO2 100%   BMI 36.99 kg/m   Physical Exam  Pupils are equally round reactive to light extraocular muscles intact Heart is regular rate and rhythm without murmur Lungs are clear to Auscultation   Assessment & Plan:   Taylor [name] was seen today for knee pain and hand pain.  Diagnoses and all orders for this visit:  Failed back syndrome of lumbar spine  Low back derangement syndrome  Chronic pain syndrome  Other orders -     Discontinue: oxyCODONE-acetaminophen (PERCOCET) 7.5-325 MG tablet; Take 1 tablet by mouth 3 (three) times daily. -     oxyCODONE-acetaminophen (PERCOCET) 7.5-325 MG tablet; Take 1 tablet by mouth 3 (three) times daily.     ----------------------------------------------------------------------------------------------------------------------  Problem List Items Addressed This Visit      Other   Chronic pain   Relevant Medications   oxyCODONE-acetaminophen (PERCOCET) 7.5-325 MG tablet  Other Visit Diagnoses    Failed back syndrome of lumbar spine    -  Primary   Low back derangement syndrome       Relevant Medications   oxyCODONE-acetaminophen (PERCOCET) 7.5-325 MG tablet      ----------------------------------------------------------------------------------------------------------------------  1. Failed back syndrome of lumbar spine I will refill her medications for the next 2 months with return to clinic in 2 months. We have reviewed the [facility]  practitioner database information and it is appropriate  2. Sciatica associated with disorder of lumbar spine, right We have talked about options for a possible epidural steroid injection should her radicular right side pain worsen. 3. Low back derangement syndrome     ----------------------------------------------------------------------------------------------------------------------  I am having Taylor [name] maintain her bimatoprost, Cholecalciferol, fluticasone, glucose blood, conjugated estrogens, alendronate, DUREZOL, omeprazole, brimonidine-timolol, ranitidine, acetaminophen, cyclobenzaprine, Bromfenac Sodium, ketorolac, ONETOUCH DELICA LANCETS FINE, pravastatin, quinapril, metFORMIN, amLODipine, fluconazole, ketoconazole, omega-3 acid ethyl esters, benzonatate, lidocaine, BREO ELLIPTA, and oxyCODONE-acetaminophen.   Meds ordered this encounter  Medications  . DISCONTD: oxyCODONE-acetaminophen (PERCOCET) 7.5-325 MG tablet    Sig: Take 1 tablet by mouth 3 (three) times daily.    Dispense:  90 tablet    Refill:  0    Do not fill until [id number]  . oxyCODONE-acetaminophen (PERCOCET) 7.5-325 MG tablet    Sig: Take 1 tablet by mouth 3 (three) times daily.    Dispense:  90 tablet    Refill:  0    Do not fill until [id number]       Follow-up: Return in about 2 months (around 07/22/2016) for evaluation, med refill.    [name], MD  This dictation was performed utilizing Dragon voice recognition software.  Please excuse any unintentional or mistaken typographical errors as a result of its unedited utilization.

## 2016-06-04 ENCOUNTER — Other Ambulatory Visit: Payer: Self-pay | Admitting: Family Medicine

## 2016-06-04 DIAGNOSIS — Z1231 Encounter for screening mammogram for malignant neoplasm of breast: Secondary | ICD-10-CM

## 2016-06-06 ENCOUNTER — Ambulatory Visit (INDEPENDENT_AMBULATORY_CARE_PROVIDER_SITE_OTHER): Payer: Medicare Other | Admitting: Ophthalmology

## 2016-06-06 DIAGNOSIS — H43813 Vitreous degeneration, bilateral: Secondary | ICD-10-CM | POA: Diagnosis not present

## 2016-06-06 DIAGNOSIS — I10 Essential (primary) hypertension: Secondary | ICD-10-CM

## 2016-06-06 DIAGNOSIS — H35033 Hypertensive retinopathy, bilateral: Secondary | ICD-10-CM | POA: Diagnosis not present

## 2016-06-26 LAB — HM DIABETES EYE EXAM

## 2016-06-27 LAB — HM DIABETES EYE EXAM

## 2016-07-03 ENCOUNTER — Other Ambulatory Visit: Payer: Self-pay

## 2016-07-03 DIAGNOSIS — N898 Other specified noninflammatory disorders of vagina: Secondary | ICD-10-CM

## 2016-07-03 MED ORDER — ESTROGENS, CONJUGATED 0.625 MG/GM VA CREA: 1.0000 | TOPICAL_CREAM | Freq: Every day | VAGINAL | 5 refills | Status: DC

## 2016-07-03 NOTE — Telephone Encounter (Signed)
Patient requesting refill of Premarin cream to [facility].

## 2016-07-19 ENCOUNTER — Ambulatory Visit: Payer: Medicare Other | Attending: Anesthesiology | Admitting: Nurse Practitioner

## 2016-07-19 ENCOUNTER — Encounter: Payer: Self-pay | Admitting: Nurse Practitioner

## 2016-07-19 VITALS — BP 179/71 | HR 72 | Temp 98.2°F | Resp 18 | Ht 61.0 in | Wt 210.0 lb

## 2016-07-19 DIAGNOSIS — E785 Hyperlipidemia, unspecified: Secondary | ICD-10-CM | POA: Insufficient documentation

## 2016-07-19 DIAGNOSIS — N63 Unspecified lump in unspecified breast: Secondary | ICD-10-CM | POA: Diagnosis not present

## 2016-07-19 DIAGNOSIS — M25561 Pain in right knee: Secondary | ICD-10-CM | POA: Insufficient documentation

## 2016-07-19 DIAGNOSIS — I10 Essential (primary) hypertension: Secondary | ICD-10-CM | POA: Insufficient documentation

## 2016-07-19 DIAGNOSIS — M545 Low back pain: Secondary | ICD-10-CM

## 2016-07-19 DIAGNOSIS — R809 Proteinuria, unspecified: Secondary | ICD-10-CM | POA: Insufficient documentation

## 2016-07-19 DIAGNOSIS — K429 Umbilical hernia without obstruction or gangrene: Secondary | ICD-10-CM | POA: Insufficient documentation

## 2016-07-19 DIAGNOSIS — G894 Chronic pain syndrome: Secondary | ICD-10-CM

## 2016-07-19 DIAGNOSIS — M25562 Pain in left knee: Secondary | ICD-10-CM | POA: Diagnosis present

## 2016-07-19 DIAGNOSIS — Z7984 Long term (current) use of oral hypoglycemic drugs: Secondary | ICD-10-CM | POA: Insufficient documentation

## 2016-07-19 DIAGNOSIS — M150 Primary generalized (osteo)arthritis: Secondary | ICD-10-CM

## 2016-07-19 DIAGNOSIS — M109 Gout, unspecified: Secondary | ICD-10-CM | POA: Diagnosis not present

## 2016-07-19 DIAGNOSIS — E669 Obesity, unspecified: Secondary | ICD-10-CM | POA: Insufficient documentation

## 2016-07-19 DIAGNOSIS — Z79891 Long term (current) use of opiate analgesic: Secondary | ICD-10-CM | POA: Diagnosis not present

## 2016-07-19 DIAGNOSIS — N200 Calculus of kidney: Secondary | ICD-10-CM | POA: Insufficient documentation

## 2016-07-19 DIAGNOSIS — E119 Type 2 diabetes mellitus without complications: Secondary | ICD-10-CM | POA: Insufficient documentation

## 2016-07-19 DIAGNOSIS — K219 Gastro-esophageal reflux disease without esophagitis: Secondary | ICD-10-CM | POA: Diagnosis not present

## 2016-07-19 DIAGNOSIS — M8949 Other hypertrophic osteoarthropathy, multiple sites: Secondary | ICD-10-CM

## 2016-07-19 DIAGNOSIS — M1990 Unspecified osteoarthritis, unspecified site: Secondary | ICD-10-CM | POA: Diagnosis not present

## 2016-07-19 DIAGNOSIS — Z6839 Body mass index (BMI) 39.0-39.9, adult: Secondary | ICD-10-CM | POA: Insufficient documentation

## 2016-07-19 DIAGNOSIS — B354 Tinea corporis: Secondary | ICD-10-CM | POA: Insufficient documentation

## 2016-07-19 DIAGNOSIS — H409 Unspecified glaucoma: Secondary | ICD-10-CM | POA: Diagnosis not present

## 2016-07-19 DIAGNOSIS — M159 Polyosteoarthritis, unspecified: Secondary | ICD-10-CM

## 2016-07-19 DIAGNOSIS — G8929 Other chronic pain: Secondary | ICD-10-CM | POA: Diagnosis not present

## 2016-07-19 DIAGNOSIS — Z951 Presence of aortocoronary bypass graft: Secondary | ICD-10-CM | POA: Diagnosis not present

## 2016-07-19 MED ORDER — OXYCODONE-ACETAMINOPHEN 7.5-325 MG PO TABS: 1.0000 | ORAL_TABLET | Freq: Four times a day (QID) | ORAL | 0 refills | Status: DC | PRN

## 2016-07-19 MED ORDER — OXYCODONE-ACETAMINOPHEN 7.5-325 MG PO TABS: 1.0000 | ORAL_TABLET | Freq: Three times a day (TID) | ORAL | 0 refills | Status: AC

## 2016-07-19 NOTE — Progress Notes (Signed)
Patient's Name: Taylor [name]  MRN: [id number]  Referring Provider: [name], MD  DOB: 01/17/1933  PCP: [name], MD  DOS: 07/19/2016  Note by: Taylor Francois NP  Service setting: Ambulatory outpatient  Specialty: Interventional Pain Management  Location: [facility] (AMB) Pain Management Facility    Patient type: Established    Primary Reason(s) for Visit: Encounter for prescription drug management (Level of risk: moderate) CC: Knee Pain (bilateral, but right is worse) and Hand Pain (bilateral)  HPI  Taylor [name] is a 83 y.o. year old, female patient, who comes today for a medication management evaluation. She has Lump or mass in breast; Umbilical hernia; Osteoarthritis, multiple sites; Type 2 diabetes mellitus with microalbuminuria ([place]); Glaucoma; BP (high blood pressure); Chronic pain; Tinea corporis; Calculus of kidney; Vaginal dryness; Dyslipidemia associated with type 2 diabetes mellitus ([place]); Chronic midline low back pain without sciatica; Chronic pain of right knee; and Long term current use of opiate analgesic on her problem list. Her primarily concern today is the Knee Pain (bilateral, but right is worse) and Hand Pain (bilateral)  Pain Assessment: Self-Reported Pain Score: 7 /10 Clinically the patient looks like a 3/10 Reported level is inconsistent with clinical observations. Information on the proper use of the pain scale provided to the patient today Pain Type: Chronic pain Pain Location: Knee (hands) Pain Orientation: Left, Right (right is worse) Pain Descriptors / Indicators: Aching, Constant Pain Frequency: Constant  Taylor [name] was last scheduled for an appointment on 04/03/018 medication management. During today's appointment we reviewed Taylor [name]'s chronic pain status, as well as her outpatient medication regimen.  She has chronic knee and hand pain. She denies any radicular symptoms. She admits that the right knee is worse. She has a lot of swelling and stiffness  in her knee. She admits that it is worse at night. She does use the Voltren gel which she states does help.  The patient  reports that she does not use drugs. Her body mass index is 39.68 kg/m.  Further details on both, my assessment(s), as well as the proposed treatment plan, please see below.  Controlled Substance Pharmacotherapy Assessment REMS (Risk Evaluation and Mitigation Strategy)  Analgesic: Oxycodone/acetaminophen 7.5/325 every 6 hours (30  MME/day: 61m/day Tice, Taylor [name]  07/19/2016  2:48 PM  Signed Nursing Pain Medication Assessment:  Safety precautions to be maintained throughout the outpatient stay will include: orient to surroundings, keep bed in low position, maintain call bell within reach at all times, provide assistance with transfer out of bed and ambulation.  Medication Inspection Compliance: Pill count conducted under aseptic conditions, in front of the patient. Neither the pills nor the bottle was removed from the patient's sight at any time. Once count was completed pills were immediately returned to the patient in their original bottle.  Medication: Oxycodone/APAP Pill/Patch Count: 68 of 90 pills remain Pill/Patch Appearance: Markings consistent with prescribed medication Bottle Appearance: Standard pharmacy container. Clearly labeled. Filled Date: 05 / 24 / 2018 Last Medication intake:  Today   Pharmacokinetics: Liberation and absorption (onset of action): WNL Distribution (time to peak effect): WNL Metabolism and excretion (duration of action): WNL         Pharmacodynamics: Desired effects: Analgesia: Ms. CSeherreports >50% benefit. Functional ability: Patient reports that medication allows her to accomplish basic ADLs Clinically meaningful improvement in function (CMIF): Sustained CMIF goals met Perceived effectiveness: Described as relatively effective, allowing for increase in activities of daily living (ADL) Undesirable effects: Side-effects or  Adverse reactions: None reported Monitoring: [place] PMP: Online review of the past 18-monthperiod conducted. Compliant with practice rules and regulations List of all UDS test(s) done:  Lab Results  Component Value Date   TOXASSSELUR FINAL 01/31/2016   TKnoxFINAL 11/14/2015   Last UDS on record: ToxAssure Select 13  Date Value Ref Range Status  01/31/2016 FINAL  Final    Comment:    ==================================================================== TOXASSURE SELECT 13 (MW) ==================================================================== Test                             Result       Flag       Units Drug Present and Declared for Prescription Verification   Oxycodone                      528          EXPECTED   ng/mg creat   Oxymorphone                    596          EXPECTED   ng/mg creat   Noroxycodone                   699          EXPECTED   ng/mg creat   Noroxymorphone                 240          EXPECTED   ng/mg creat    Sources of oxycodone are scheduled prescription medications.    Oxymorphone, noroxycodone, and noroxymorphone are expected    metabolites of oxycodone. Oxymorphone is also available as a    scheduled prescription medication. ==================================================================== Test                      Result    Flag   Units      Ref Range   Creatinine              126              mg/dL      >=20 ==================================================================== Declared Medications:  The flagging and interpretation on this report are based on the  following declared medications.  Unexpected results may arise from  inaccuracies in the declared medications.  **Note: The testing scope of this panel includes these medications:  Oxycodone (Percocet)  **Note: The testing scope of this panel does not include following  reported medications:  Acetaminophen (Percocet)  Acetaminophen (Tylenol)  Alendronate (Fosamax)  Amlodipine  (Norvasc)  Bimatoprost (Lumigan)  Bromfenac (Prolensa)  Cholecalciferol  Cyclobenzaprine (Flexeril)  Estrogen (Premarin)  Eye Drops  Fluticasone (Flonase)  Metformin  Omeprazole  Pravastatin (Pravachol)  Quinapril  Ranitidine (Zantac) ==================================================================== For clinical consultation, please call ([phone] ====================================================================    UDS interpretation: Compliant          Medication Assessment Form: Reviewed. Patient indicates being compliant with therapy Treatment compliance: Compliant Risk Assessment Profile: Aberrant behavior: See prior evaluations. None observed or detected today Comorbid factors increasing risk of overdose: See prior notes. No additional risks detected today Risk of substance use disorder (SUD): Low Opioid Risk Tool (ORT) Total Score: 0  Interpretation Table:  Score <3 = Low Risk for SUD  Score between 4-7 = Moderate Risk for SUD  Score >8 = High Risk for Opioid Abuse  Risk Mitigation Strategies:  Patient Counseling: Covered Patient-Prescriber Agreement (PPA): Present and active  Notification to other healthcare providers: Done  Pharmacologic Plan: No change in therapy, at this time  Laboratory Chemistry  Inflammation Markers No results found for: CRP, ESRSEDRATE (CRP: Acute Phase) (ESR: Chronic Phase) Renal Function Markers Lab Results  Component Value Date   BUN 22 08/09/2015   CREATININE 1.38 (H) 08/09/2015   GFRAA 42 (L) 08/09/2015   GFRNONAA 36 (L) 08/09/2015   Hepatic Function Markers Lab Results  Component Value Date   AST 9 08/09/2015   ALT 6 08/09/2015   ALBUMIN 4.1 08/09/2015   ALKPHOS 56 08/09/2015   Electrolytes Lab Results  Component Value Date   NA 141 08/09/2015   K 5.2 08/09/2015   CL 105 08/09/2015   CALCIUM 9.3 08/09/2015   Neuropathy Markers No results found for: [id number] Bone Pathology Markers Lab Results   Component Value Date   ALKPHOS 56 08/09/2015   CALCIUM 9.3 08/09/2015   Coagulation Parameters No results found for: INR, LABPROT, APTT, PLT Cardiovascular Markers No results found for: BNP, HGB, HCT Note: Lab results reviewed.  Recent Diagnostic Imaging Review  Dg Chest 2 View  Result Date: 08/11/2015 CLINICAL DATA:  Acute onset of right-sided arm pain, radiating to the right shoulder. Initial encounter. EXAM: CHEST  2 VIEW COMPARISON:  Chest radiograph performed 06/11/2012 FINDINGS: The lungs are well-aerated and clear. There is no evidence of focal opacification, pleural effusion or pneumothorax. The heart is borderline enlarged. No acute osseous abnormalities are seen. Clips are noted within the right upper quadrant, reflecting prior cholecystectomy. IMPRESSION: Borderline cardiomegaly, though the superior mediastinum is normal in appearance. The thoracic aorta is grossly unremarkable in appearance. No acute cardiopulmonary process seen. Electronically Signed   By: Garald Balding M.D.   On: 08/11/2015 23:19   Note: Imaging results reviewed.          Meds  The patient has a current medication list which includes the following prescription(s): acetaminophen, alendronate, amlodipine, benzonatate, besivance, bimatoprost, breo ellipta, brimonidine-timolol, bromfenac sodium, cholecalciferol, conjugated estrogens, cyclobenzaprine, durezol, fluconazole, fluticasone, glucose blood, ketoconazole, lidocaine, metformin, omega-3 acid ethyl esters, omeprazole, onetouch delica lancets fine, pravastatin, quinapril, ranitidine, ketorolac, oxycodone-acetaminophen, and oxycodone-acetaminophen.  Current Outpatient Prescriptions on File Prior to Visit  Medication Sig  . acetaminophen (TYLENOL) 500 MG tablet Take 1 tablet (500 mg total) by mouth 3 (three) times daily. (Patient taking differently: Take 500 mg by mouth as needed. )  . alendronate (FOSAMAX) 70 MG tablet TAKE 1 TABLET WEEKLY  . amLODipine  (NORVASC) 10 MG tablet Take 1 tablet (10 mg total) by mouth daily.  . benzonatate (TESSALON) 100 MG capsule Take 1-2 capsules (100-200 mg total) by mouth 3 (three) times daily as needed.  . bimatoprost (LUMIGAN) 0.01 % SOLN Place 1 drop into both eyes daily.   [name] BREO ELLIPTA 100-25 MCG/INH AEPB INHALE 1 PUFF INTO LUNGS DAILY  . brimonidine-timolol (COMBIGAN) 0.2-0.5 % ophthalmic solution Place 1 drop into both eyes See admin instructions. 1 drop into both eyes in the morning, then 1 drop into both eyes 8 hours later  . Bromfenac Sodium 0.09 % SOLN APPLY 1 DROP INTO AFFECTED EYE DAILY  . Cholecalciferol (VITAMIN D-1000 MAX ST) 1000 UNITS tablet Take 1,000 Units by mouth daily. Reported on 08/05/2015  . conjugated estrogens (PREMARIN) vaginal cream Place 1 Applicatorful vaginally daily.  . cyclobenzaprine (FLEXERIL) 5 MG tablet Take 5 mg by mouth 3 (three) times daily.  . DUREZOL 0.05 %  EMUL Place 1 drop into both eyes 4 (four) times daily.   . fluconazole (DIFLUCAN) 150 MG tablet Take 1 tablet (150 mg total) by mouth every other day.  . fluticasone (FLONASE) 50 MCG/ACT nasal spray Place 2 sprays into both nostrils daily.   [name] glucose blood test strip Use as instructed  . ketoconazole (NIZORAL) 2 % cream Apply 1 application topically daily.  [name] lidocaine (XYLOCAINE) 5 % ointment Apply 1 application topically 3 (three) times daily as needed. 2 grams  . metFORMIN (GLUCOPHAGE) 500 MG tablet Take 1 tablet (500 mg total) by mouth 2 (two) times daily with a meal.  . omega-3 acid ethyl esters (LOVAZA) 1 g capsule Take 1 capsule (1 g total) by mouth 2 (two) times daily.  [name] omeprazole (PRILOSEC) 20 MG capsule Take 1 capsule (20 mg total) by mouth daily.  [name] DELICA LANCETS FINE MISC 1 each by Does not apply route daily. [place]  . pravastatin (PRAVACHOL) 40 MG tablet Take 1 tablet (40 mg total) by mouth at bedtime.  . quinapril (ACCUPRIL) 40 MG tablet Take 1 tablet (40 mg total)  by mouth daily.  . ranitidine (ZANTAC) 150 MG tablet Take 1 tablet (150 mg total) by mouth 2 (two) times daily. Try to take this in place of Omeprazole  . ketorolac (ACULAR) 0.5 % ophthalmic solution    No current facility-administered medications on file prior to visit.    ROS  Constitutional: Denies any fever or chills Gastrointestinal: No reported hemesis, hematochezia, vomiting, or acute GI distress Musculoskeletal: Denies any acute onset joint swelling, redness, loss of ROM, or weakness Neurological: No reported episodes of acute onset apraxia, aphasia, dysarthria, agnosia, amnesia, paralysis, loss of coordination, or loss of consciousness  Allergies  Ms. Birchler has No Known Allergies.  [place]  Drug: Ms. Benton  reports that she does not use drugs. Alcohol:  reports that she does not drink alcohol. Tobacco:  reports that she has never smoked. She has never used smokeless tobacco. Medical:  has a past medical history of Abnormal mammogram, unspecified (2013); Breast screening, unspecified (2013); Diabetes mellitus without complication ([place]) (9622); GERD (gastroesophageal reflux disease); Glaucoma (2003); Gout; Hyperlipidemia (2008); Hypertension (1980's); Lump or mass in breast (01/03/2012); Obesity, unspecified (2013); Osteoporosis; Shingles (2013); and Special screening for malignant neoplasms, colon (2013). Family: family history includes Cancer in her mother; Heart disease in her father; Stroke in her mother.  Past Surgical History:  Procedure Laterality Date  . ABDOMINAL HYSTERECTOMY  1958   menorrhagia  . BACK SURGERY    . CATARACT EXTRACTION, BILATERAL Bilateral    1st 06/07/15 and the 2nd 06/21/15  . CHOLECYSTECTOMY    . COLONOSCOPY  2003   [facility]  . CORONARY ARTERY BYPASS GRAFT Left 01/03/2012,01/24/2012   Left FNA, Left breast [facility] bx  . KNEE SURGERY  2009,2011,2012   twice on right and once on left  . KNEE SURGERY    . LIPOMA EXCISION  1998  . [place]  2004  .  TONSILLECTOMY     age of 59   Constitutional Exam  General appearance: Well nourished, well developed, and well hydrated. In no apparent acute distress Vitals:   07/19/16 1440 07/19/16 1441  BP:  (!) 179/71  Pulse:  72  Resp:  18  Temp:  98.2 F (36.8 C)  SpO2:  100%  Weight: 210 lb (95.3 kg)   Height: _0  (1.549 m)    BMI Assessment: Estimated body mass  index is 39.68 kg/m as calculated from the following:   Height as of this encounter: _0  (1.549 m).   Weight as of this encounter: 210 lb (95.3 kg).  BMI interpretation table: BMI level Category Range association with higher incidence of chronic pain  <18 kg/m2 Underweight   18.5-24.9 kg/m2 Ideal body weight   25-29.9 kg/m2 Overweight Increased incidence by 20%  30-34.9 kg/m2 Obese (Class I) Increased incidence by 68%  35-39.9 kg/m2 Severe obesity (Class II) Increased incidence by 136%  >40 kg/m2 Extreme obesity (Class III) Increased incidence by 254%   BMI Readings from Last 4 Encounters:  07/19/16 39.68 kg/m  05/22/16 36.99 kg/m  04/23/16 37.77 kg/m  04/16/16 39.41 kg/m   Wt Readings from Last 4 Encounters:  07/19/16 210 lb (95.3 kg)  05/22/16 199 lb (90.3 kg)  04/23/16 199 lb 14.4 oz (90.7 kg)  04/16/16 208 lb 9 oz (94.6 kg)  Psych/Mental status: Alert, oriented x 3 (person, place, & time)       Eyes: PERLA Respiratory: No evidence of acute respiratory distress  Upper Extremity (UE) Exam    Side: Right upper extremity  Side: Left upper extremity  Inspection: No masses, redness, swelling, or asymmetry. No contractures  Inspection: No masses, redness, swelling, or asymmetry. No contractures  Functional ROM: Unrestricted ROM          Functional ROM: Unrestricted ROM          Muscle strength & Tone: Functionally intact  Muscle strength & Tone: Functionally intact  Sensory: Unimpaired  Sensory: Unimpaired  Palpation: No palpable anomalies              Palpation: No palpable anomalies              Specialized  Test(s): Deferred         Specialized Test(s): Deferred           Lumbar Spine Exam  Inspection: No masses, redness, or swelling Alignment: Symmetrical Functional ROM: Unrestricted ROM      Stability: No instability detected Muscle strength & Tone: Functionally intact Sensory: Unimpaired Palpation: No palpable anomalies       Provocative Tests: Lumbar Hyperextension and rotation test: evaluation deferred today       Patrick's Maneuver: evaluation deferred today                    Gait & Posture Assessment  Ambulation: Patient ambulates using a cane Gait: Limited. Using assistive device to ambulate Posture: WNL   Lower Extremity Exam    Side: Right lower extremity  Side: Left lower extremity  Inspection: Swelling and warmth; signs if inflammation  Inspection: No masses, redness, swelling, or asymmetry. No contractures  Functional ROM: Unrestricted ROM          Functional ROM: Unrestricted ROM          Muscle strength & Tone: Functionally intact  Muscle strength & Tone: Functionally intact  Sensory: Unimpaired  Sensory: Unimpaired  Palpation: No palpable anomalies  Palpation: No palpable anomalies   Assessment  Primary Diagnosis & Pertinent Problem List: The primary encounter diagnosis was Chronic pain of right knee. Diagnoses of Primary osteoarthritis involving multiple joints, Chronic midline low back pain without sciatica, Chronic pain syndrome, and Long term current use of opiate analgesic were also pertinent to this visit.  Status Diagnosis  Controlled Controlled Controlled 1. Chronic pain of right knee   2. Primary osteoarthritis involving multiple joints   3. Chronic midline low back  pain without sciatica   4. Chronic pain syndrome   5. Long term current use of opiate analgesic     Problems updated and reviewed during this visit: Problem  Chronic Midline Low Back Pain Without Sciatica  Chronic Pain of Right Knee  Osteoarthritis, Multiple Sites  Chronic Pain  Long  Term Current Use of Opiate Analgesic  Dyslipidemia Associated With Type 2 Diabetes Mellitus ([facility])  Vaginal Dryness  Calculus of Kidney  Type 2 Diabetes Mellitus With Microalbuminuria ([facility])  Glaucoma  Bp (High Blood Pressure)  Tinea Corporis  Umbilical Hernia  Lump Or Mass in Breast   Plan of Care  Pharmacotherapy (Medications Ordered): Meds ordered this encounter  Medications  . oxyCODONE-acetaminophen (PERCOCET) 7.5-325 MG tablet    Sig: Take 1 tablet by mouth 3 (three) times daily.    Dispense:  90 tablet    Refill:  0    Do not fill until 08/10/2016    Order Specific Question:   Supervising Provider    Answer:   [name] [[id number]]  . oxyCODONE-acetaminophen (PERCOCET) 7.5-325 MG tablet    Sig: Take 1 tablet by mouth every 6 (six) hours as needed for moderate pain or severe pain.    Dispense:  90 tablet    Refill:  0    Do not place this medication, or any other prescription from our practice, on "Automatic Refill". Patient may have prescription filled one day early if pharmacy is closed on scheduled refill date. Do not fill until: 09/09/16 To last until:10/09/16    Order Specific Question:   Supervising Provider    Answer:   [name] [[id number]]   New Prescriptions   OXYCODONE-ACETAMINOPHEN (PERCOCET) 7.5-325 MG TABLET    Take 1 tablet by mouth every 6 (six) hours as needed for moderate pain or severe pain.   Medications administered today: Ms. Ismael had no medications administered during this visit. Lab-work, procedure(s), and/or referral(s): No orders of the defined types were placed in this encounter.  Imaging and/or referral(s): None  Interventional therapies: Planned, scheduled, and/or pending:   Not at this time.   Considering:   Right genicular nerve block    Palliative PRN treatment(s):   Not at this time    Provider-requested follow-up: Return in about 2 months (around 09/18/2016) for Medication Mgmt.  Future Appointments Date Time Provider  [place]  07/26/2016 2:20 PM [facility]  08/14/2016 9:20 AM [name], MD [place] None  09/13/2016 1:45 PM Taylor Francois, NP [facility] None  06/10/2017 1:45 PM Hayden Pedro, MD [facility] None   Primary Care Physician: [name], MD Location: [facility] Facility Note by: Taylor Francois NP Date: 07/19/2016; Time: 3:39 PM  Pain Score Disclaimer: We use the NRS-11 scale. This is a self-reported, subjective measurement of pain severity with only modest accuracy. It is used primarily to identify changes within a particular patient. It must be understood that outpatient pain scales are significantly less accurate that those used for research, where they can be applied under ideal controlled circumstances with minimal exposure to variables. In reality, the score is likely to be a combination of pain intensity and pain affect, where pain affect describes the degree of emotional arousal or changes in action readiness caused by the sensory experience of pain. Factors such as social and work situation, setting, emotional state, anxiety levels, expectation, and prior pain experience may influence pain perception and show large inter-individual differences that may also be affected  by time variables.  Patient instructions provided during this appointment: Patient Instructions    ____________________________________________________________________________________________  Medication Rules  Applies to: All patients receiving prescriptions (written or electronic).  Pharmacy of record: Pharmacy where electronic prescriptions will be sent. If written prescriptions are taken to a different pharmacy, please inform the nursing staff. The pharmacy listed in the electronic medical record should be the one where you would like electronic prescriptions to be sent.  Prescription refills: Only during scheduled appointments. Applies to both, written and electronic  prescriptions.  NOTE: The following applies primarily to controlled substances (Opioid Pain Medications)  Patient's responsibilities: 1. Pain Pills: Bring all pain pills to every appointment (except for procedure appointments). 2. Pill Bottles: Bring pills in original pharmacy bottle. Always bring newest bottle. Bring bottle, even if empty. 3. Medication refills: You are responsible for knowing and keeping track of what medications you need refilled. The day before your appointment, write a list of all prescriptions that need to be refilled. Bring that list to your appointment and give it to the admitting nurse. Prescriptions will be written only during appointments. If you forget a medication, it will not be "Called in", "Faxed", or "electronically sent". You will need to get another appointment to get these prescribed. 4. Prescription Accuracy: You are responsible for carefully inspecting your prescriptions before leaving our office. Have the discharge nurse carefully go over each prescription with you, before taking them home. Make sure that your name is accurately spelled, that your address is correct. Check the name and dose of your medication to make sure it is accurate. Check the number of pills, and the written instructions to make sure they are clear and accurate. Make sure that you are given enough medication to last until your next medication refill appointment. 5. Taking Medication: Take medication as prescribed. Never take more pills than instructed. Never take medication more frequently than prescribed. Taking less pills or less frequently is permitted and encouraged, when it comes to controlled substances (written prescriptions).  6. Inform other Doctors: Always inform, all of your healthcare providers, of all the medications you take. 7. Pain Medication from other Providers: You are not allowed to accept any additional pain medication from any other Doctor or Healthcare provider. There are  two exceptions to this rule. (see below) In the event that you require additional pain medication, you are responsible for notifying us, as stated below. 8. Medication Agreement: You are responsible for carefully reading and following our Medication Agreement. This must be signed before receiving any prescriptions from our practice. Safely store a copy of your signed Agreement. Violations to the Agreement will result in no further prescriptions. (Additional copies of our Medication Agreement are available upon request.) 9. Laws, Rules, & Regulations: All patients are expected to follow all Federal and [employer], Rules, [employer]. Ignorance of the Laws does not constitute a valid excuse.  Exceptions: There are only two exceptions to the rule of not receiving pain medications from other Healthcare Providers. 1. Exception #1 (Emergencies): In the event of an emergency (i.e.: accident requiring emergency care), you are allowed to receive additional pain medication. However, you are responsible for: As soon as you are able, call our office [phone], at any time of the day or night, and leave a message stating your name, the date and nature of the emergency, and the name and dose of the medication prescribed. In the event that your call is answered by a member of our staff, make  sure to document and save the date, time, and the name of the person that took your information.  2. Exception #2 (Planned Surgery): In the event that you are scheduled by another doctor or dentist to have any type of surgery or procedure, you are allowed (for a period no longer than 30 days), to receive additional pain medication, for the acute post-op pain. However, in this case, you are responsible for picking up a copy of our "Post-op Pain Management for Surgeons" handout, and giving it to your surgeon or dentist. This document is available at our office, and does not require an appointment to obtain it. Simply go to  our office during business hours (Monday-Thursday from 8:00 AM to 4:00 PM) (Friday 8:00 AM to 12:00 Noon) or if you have a scheduled appointment with [place], prior to your surgery, and ask for it by name. In addition, you will need to provide [place] with your name, name of your surgeon, type of surgery, and date of procedure or surgery.  ____________________________________________________________________________________________ ____________________________________________________________________________________________  Pain Scale  Introduction: The pain score used by this practice is the Verbal Numerical Rating Scale (VNRS-11). This is an 11-point scale. It is for adults and children 10 years or older. There are significant differences in how the pain score is reported, used, and applied. Forget everything you learned in the past and learn this scoring system.  General Information: The scale should reflect your current level of pain. Unless you are specifically asked for the level of your worst pain, or your average pain. If you are asked for one of these two, then it should be understood that it is over the past 24 hours.  Basic Activities of Daily Living (ADL): Personal hygiene, dressing, eating, transferring, and using restroom.  Instructions: Most patients tend to report their level of pain as a combination of two factors, their physical pain and their psychosocial pain. This last one is also known as "suffering" and it is reflection of how physical pain affects you socially and psychologically. From now on, report them separately. From this point on, when asked to report your pain level, report only your physical pain. Use the following table for reference.  Pain Clinic Pain Levels (0-5/10)  Pain Level Score  Description  No Pain 0   Mild pain 1 Nagging, annoying, but does not interfere with basic activities of daily living (ADL). Patients are able to eat, bathe, get dressed, toileting (being able  to get on and off the toilet and perform personal hygiene functions), transfer (move in and out of bed or a chair without assistance), and maintain continence (able to control bladder and bowel functions). Blood pressure and heart rate are unaffected. A normal heart rate for a healthy adult ranges from 60 to 100 bpm (beats per minute).   Mild to moderate pain 2 Noticeable and distracting. Impossible to hide from other people. More frequent flare-ups. Still possible to adapt and function close to normal. It can be very annoying and may have occasional stronger flare-ups. With discipline, patients may get used to it and adapt.   Moderate pain 3 Interferes significantly with activities of daily living (ADL). It becomes difficult to feed, bathe, get dressed, get on and off the toilet or to perform personal hygiene functions. Difficult to get in and out of bed or a chair without assistance. Very distracting. With effort, it can be ignored when deeply involved in activities.   Moderately severe pain 4 Impossible to ignore for more than a  few minutes. With effort, patients may still be able to manage work or participate in some social activities. Very difficult to concentrate. Signs of autonomic nervous system discharge are evident: dilated pupils (mydriasis); mild sweating (diaphoresis); sleep interference. Heart rate becomes elevated (>115 bpm). Diastolic blood pressure (lower number) rises above 100 mmHg. Patients find relief in laying down and not moving.   Severe pain 5 Intense and extremely unpleasant. Associated with frowning face and frequent crying. Pain overwhelms the senses.  Ability to do any activity or maintain social relationships becomes significantly limited. Conversation becomes difficult. Pacing back and forth is common, as getting into a comfortable position is nearly impossible. Pain wakes you up from deep sleep. Physical signs will be obvious: pupillary dilation; increased sweating;  goosebumps; brisk reflexes; cold, clammy hands and feet; nausea, vomiting or dry heaves; loss of appetite; significant sleep disturbance with inability to fall asleep or to remain asleep. When persistent, significant weight loss is observed due to the complete loss of appetite and sleep deprivation.  Blood pressure and heart rate becomes significantly elevated. Caution: If elevated blood pressure triggers a pounding headache associated with blurred vision, then the patient should immediately seek attention at an urgent or emergency care unit, as these may be signs of an impending stroke.    Emergency Department Pain Levels (6-10/10)  Emergency Room Pain 6 Severely limiting. Requires emergency care and should not be seen or managed at an outpatient pain management facility. Communication becomes difficult and requires great effort. Assistance to reach the emergency department may be required. Facial flushing and profuse sweating along with potentially dangerous increases in heart rate and blood pressure will be evident.   Distressing pain 7 Self-care is very difficult. Assistance is required to transport, or use restroom. Assistance to reach the emergency department will be required. Tasks requiring coordination, such as bathing and getting dressed become very difficult.   Disabling pain 8 Self-care is no longer possible. At this level, pain is disabling. The individual is unable to do even the most "basic" activities such as walking, eating, bathing, dressing, transferring to a bed, or toileting. Fine motor skills are lost. It is difficult to think clearly.   Incapacitating pain 9 Pain becomes incapacitating. Thought processing is no longer possible. Difficult to remember your own name. Control of movement and coordination are lost.   The worst pain imaginable 10 At this level, most patients pass out from pain. When this level is reached, collapse of the autonomic nervous system occurs, leading to a  sudden drop in blood pressure and heart rate. This in turn results in a temporary and dramatic drop in blood flow to the brain, leading to a loss of consciousness. Fainting is one of the body's self defense mechanisms. Passing out puts the brain in a calmed state and causes it to shut down for a while, in order to begin the healing process.    Summary: 1. Refer to this scale when providing [place] with your pain level. 2. Be accurate and careful when reporting your pain level. This will help with your care. 3. Over-reporting your pain level will lead to loss of credibility. 4. Even a level of 1/10 means that there is pain and will be treated at our facility. 5. High, inaccurate reporting will be documented as "Symptom Exaggeration", leading to loss of credibility and suspicions of possible secondary gains such as obtaining more narcotics, or wanting to appear disabled, for fraudulent reasons. 6. Only pain levels of 5 or below  will be seen at our facility. 7. Pain levels of 6 and above will be sent to the Emergency Department and the appointment cancelled. ____________________________________________________________________________________________

## 2016-07-19 NOTE — Patient Instructions (Addendum)
____________________________________________________________________________________________  Medication Rules  Applies to: All patients receiving prescriptions (written or electronic).  Pharmacy of record: Pharmacy where electronic prescriptions will be sent. If written prescriptions are taken to a different pharmacy, please inform the nursing staff. The pharmacy listed in the electronic medical record should be the one where you would like electronic prescriptions to be sent.  Prescription refills: Only during scheduled appointments. Applies to both, written and electronic prescriptions.  NOTE: The following applies primarily to controlled substances (Opioid Pain Medications)  Patient's responsibilities: 1. Pain Pills: Bring all pain pills to every appointment (except for procedure appointments). 2. Pill Bottles: Bring pills in original pharmacy bottle. Always bring newest bottle. Bring bottle, even if empty. 3. Medication refills: You are responsible for knowing and keeping track of what medications you need refilled. The day before your appointment, write a list of all prescriptions that need to be refilled. Bring that list to your appointment and give it to the admitting nurse. Prescriptions will be written only during appointments. If you forget a medication, it will not be "Called in", "Faxed", or "electronically sent". You will need to get another appointment to get these prescribed. 4. Prescription Accuracy: You are responsible for carefully inspecting your prescriptions before leaving our office. Have the discharge nurse carefully go over each prescription with you, before taking them home. Make sure that your name is accurately spelled, that your address is correct. Check the name and dose of your medication to make sure it is accurate. Check the number of pills, and the written instructions to make sure they are clear and accurate. Make sure that you are given enough medication to last  until your next medication refill appointment. 5. Taking Medication: Take medication as prescribed. Never take more pills than instructed. Never take medication more frequently than prescribed. Taking less pills or less frequently is permitted and encouraged, when it comes to controlled substances (written prescriptions).  6. Inform other Doctors: Always inform, all of your healthcare providers, of all the medications you take. 7. Pain Medication from other Providers: You are not allowed to accept any additional pain medication from any other Doctor or Healthcare provider. There are two exceptions to this rule. (see below) In the event that you require additional pain medication, you are responsible for notifying us, as stated below. 8. Medication Agreement: You are responsible for carefully reading and following our Medication Agreement. This must be signed before receiving any prescriptions from our practice. Safely store a copy of your signed Agreement. Violations to the Agreement will result in no further prescriptions. (Additional copies of our Medication Agreement are available upon request.) 9. Laws, Rules, & Regulations: All patients are expected to follow all Federal and State Laws, Statutes, Rules, & Regulations. Ignorance of the Laws does not constitute a valid excuse.  Exceptions: There are only two exceptions to the rule of not receiving pain medications from other Healthcare Providers. 1. Exception #1 (Emergencies): In the event of an emergency (i.e.: accident requiring emergency care), you are allowed to receive additional pain medication. However, you are responsible for: As soon as you are able, call our office [phone], at any time of the day or night, and leave a message stating your name, the date and nature of the emergency, and the name and dose of the medication prescribed. In the event that your call is answered by a member of our staff, make sure to document and save the date,  time, and the name of the person that   took your information.  2. Exception #2 (Planned Surgery): In the event that you are scheduled by another doctor or dentist to have any type of surgery or procedure, you are allowed (for a period no longer than 30 days), to receive additional pain medication, for the acute post-op pain. However, in this case, you are responsible for picking up a copy of our "Post-op Pain Management for Surgeons" handout, and giving it to your surgeon or dentist. This document is available at our office, and does not require an appointment to obtain it. Simply go to our office during business hours (Monday-Thursday from 8:00 AM to 4:00 PM) (Friday 8:00 AM to 12:00 Noon) or if you have a scheduled appointment with us, prior to your surgery, and ask for it by name. In addition, you will need to provide us with your name, name of your surgeon, type of surgery, and date of procedure or surgery.  _____________________________________________________________________________________________  ____________________________________________________________________________________________  Pain Scale  Introduction: The pain score used by this practice is the Verbal Numerical Rating Scale (VNRS-11). This is an 11-point scale. It is for adults and children 10 years or older. There are significant differences in how the pain score is reported, used, and applied. Forget everything you learned in the past and learn this scoring system.  General Information: The scale should reflect your current level of pain. Unless you are specifically asked for the level of your worst pain, or your average pain. If you are asked for one of these two, then it should be understood that it is over the past 24 hours.  Basic Activities of Daily Living (ADL): Personal hygiene, dressing, eating, transferring, and using restroom.  Instructions: Most patients tend to report their level of pain as a combination of two  factors, their physical pain and their psychosocial pain. This last one is also known as "suffering" and it is reflection of how physical pain affects you socially and psychologically. From now on, report them separately. From this point on, when asked to report your pain level, report only your physical pain. Use the following table for reference.  Pain Clinic Pain Levels (0-5/10)  Pain Level Score  Description  No Pain 0   Mild pain 1 Nagging, annoying, but does not interfere with basic activities of daily living (ADL). Patients are able to eat, bathe, get dressed, toileting (being able to get on and off the toilet and perform personal hygiene functions), transfer (move in and out of bed or a chair without assistance), and maintain continence (able to control bladder and bowel functions). Blood pressure and heart rate are unaffected. A normal heart rate for a healthy adult ranges from 60 to 100 bpm (beats per minute).   Mild to moderate pain 2 Noticeable and distracting. Impossible to hide from other people. More frequent flare-ups. Still possible to adapt and function close to normal. It can be very annoying and may have occasional stronger flare-ups. With discipline, patients may get used to it and adapt.   Moderate pain 3 Interferes significantly with activities of daily living (ADL). It becomes difficult to feed, bathe, get dressed, get on and off the toilet or to perform personal hygiene functions. Difficult to get in and out of bed or a chair without assistance. Very distracting. With effort, it can be ignored when deeply involved in activities.   Moderately severe pain 4 Impossible to ignore for more than a few minutes. With effort, patients may still be able to manage work or participate in   some social activities. Very difficult to concentrate. Signs of autonomic nervous system discharge are evident: dilated pupils (mydriasis); mild sweating (diaphoresis); sleep interference. Heart rate becomes  elevated (>115 bpm). Diastolic blood pressure (lower number) rises above 100 mmHg. Patients find relief in laying down and not moving.   Severe pain 5 Intense and extremely unpleasant. Associated with frowning face and frequent crying. Pain overwhelms the senses.  Ability to do any activity or maintain social relationships becomes significantly limited. Conversation becomes difficult. Pacing back and forth is common, as getting into a comfortable position is nearly impossible. Pain wakes you up from deep sleep. Physical signs will be obvious: pupillary dilation; increased sweating; goosebumps; brisk reflexes; cold, clammy hands and feet; nausea, vomiting or dry heaves; loss of appetite; significant sleep disturbance with inability to fall asleep or to remain asleep. When persistent, significant weight loss is observed due to the complete loss of appetite and sleep deprivation.  Blood pressure and heart rate becomes significantly elevated. Caution: If elevated blood pressure triggers a pounding headache associated with blurred vision, then the patient should immediately seek attention at an urgent or emergency care unit, as these may be signs of an impending stroke.    Emergency Department Pain Levels (6-10/10)  Emergency Room Pain 6 Severely limiting. Requires emergency care and should not be seen or managed at an outpatient pain management facility. Communication becomes difficult and requires great effort. Assistance to reach the emergency department may be required. Facial flushing and profuse sweating along with potentially dangerous increases in heart rate and blood pressure will be evident.   Distressing pain 7 Self-care is very difficult. Assistance is required to transport, or use restroom. Assistance to reach the emergency department will be required. Tasks requiring coordination, such as bathing and getting dressed become very difficult.   Disabling pain 8 Self-care is no longer possible. At  this level, pain is disabling. The individual is unable to do even the most "basic" activities such as walking, eating, bathing, dressing, transferring to a bed, or toileting. Fine motor skills are lost. It is difficult to think clearly.   Incapacitating pain 9 Pain becomes incapacitating. Thought processing is no longer possible. Difficult to remember your own name. Control of movement and coordination are lost.   The worst pain imaginable 10 At this level, most patients pass out from pain. When this level is reached, collapse of the autonomic nervous system occurs, leading to a sudden drop in blood pressure and heart rate. This in turn results in a temporary and dramatic drop in blood flow to the brain, leading to a loss of consciousness. Fainting is one of the body's self defense mechanisms. Passing out puts the brain in a calmed state and causes it to shut down for a while, in order to begin the healing process.    Summary: 1. Refer to this scale when providing us with your pain level. 2. Be accurate and careful when reporting your pain level. This will help with your care. 3. Over-reporting your pain level will lead to loss of credibility. 4. Even a level of 1/10 means that there is pain and will be treated at our facility. 5. High, inaccurate reporting will be documented as "Symptom Exaggeration", leading to loss of credibility and suspicions of possible secondary gains such as obtaining more narcotics, or wanting to appear disabled, for fraudulent reasons. 6. Only pain levels of 5 or below will be seen at our facility. 7. Pain levels of 6 and above will be   sent to the Emergency Department and the appointment cancelled. _____________________________________________________________________________________________   

## 2016-07-19 NOTE — Progress Notes (Signed)
Nursing Pain Medication Assessment:  Safety precautions to be maintained throughout the outpatient stay will include: orient to surroundings, keep bed in low position, maintain call bell within reach at all times, provide assistance with transfer out of bed and ambulation.  Medication Inspection Compliance: Pill count conducted under aseptic conditions, in front of the patient. Neither the pills nor the bottle was removed from the patient's sight at any time. Once count was completed pills were immediately returned to the patient in their original bottle.  Medication: Oxycodone/APAP Pill/Patch Count: 68 of 90 pills remain Pill/Patch Appearance: Markings consistent with prescribed medication Bottle Appearance: Standard pharmacy container. Clearly labeled. Filled Date: 05 / 24 / 2018 Last Medication intake:  Today

## 2016-07-26 ENCOUNTER — Ambulatory Visit
Admission: RE | Admit: 2016-07-26 | Discharge: 2016-07-26 | Disposition: A | Discharge status: Home / Self Care | Payer: Medicare Other | Source: Ambulatory Visit | Attending: Family Medicine | Admitting: Family Medicine

## 2016-07-26 DIAGNOSIS — Z1231 Encounter for screening mammogram for malignant neoplasm of breast: Secondary | ICD-10-CM | POA: Diagnosis not present

## 2016-07-26 DIAGNOSIS — R928 Other abnormal and inconclusive findings on diagnostic imaging of breast: Secondary | ICD-10-CM | POA: Diagnosis not present

## 2016-07-27 ENCOUNTER — Other Ambulatory Visit: Payer: Self-pay | Admitting: Family Medicine

## 2016-07-27 DIAGNOSIS — N6310 Unspecified lump in the right breast, unspecified quadrant: Secondary | ICD-10-CM

## 2016-07-27 DIAGNOSIS — R928 Other abnormal and inconclusive findings on diagnostic imaging of breast: Secondary | ICD-10-CM

## 2016-08-09 ENCOUNTER — Ambulatory Visit
Admission: RE | Admit: 2016-08-09 | Discharge: 2016-08-09 | Disposition: A | Discharge status: Home / Self Care | Payer: Medicare Other | Source: Ambulatory Visit | Attending: Family Medicine | Admitting: Family Medicine

## 2016-08-09 DIAGNOSIS — N6310 Unspecified lump in the right breast, unspecified quadrant: Secondary | ICD-10-CM | POA: Diagnosis not present

## 2016-08-09 DIAGNOSIS — R928 Other abnormal and inconclusive findings on diagnostic imaging of breast: Secondary | ICD-10-CM | POA: Insufficient documentation

## 2016-08-14 ENCOUNTER — Ambulatory Visit (INDEPENDENT_AMBULATORY_CARE_PROVIDER_SITE_OTHER): Payer: Medicare Other | Admitting: Family Medicine

## 2016-08-14 ENCOUNTER — Encounter: Payer: Self-pay | Admitting: Family Medicine

## 2016-08-14 VITALS — BP 138/68 | HR 73 | Temp 98.0°F | Resp 16 | Ht 61.0 in | Wt 208.2 lb

## 2016-08-14 DIAGNOSIS — J3089 Other allergic rhinitis: Secondary | ICD-10-CM

## 2016-08-14 DIAGNOSIS — M8949 Other hypertrophic osteoarthropathy, multiple sites: Secondary | ICD-10-CM

## 2016-08-14 DIAGNOSIS — E1129 Type 2 diabetes mellitus with other diabetic kidney complication: Secondary | ICD-10-CM | POA: Diagnosis not present

## 2016-08-14 DIAGNOSIS — M25561 Pain in right knee: Secondary | ICD-10-CM | POA: Diagnosis not present

## 2016-08-14 DIAGNOSIS — N182 Chronic kidney disease, stage 2 (mild): Secondary | ICD-10-CM

## 2016-08-14 DIAGNOSIS — E785 Hyperlipidemia, unspecified: Secondary | ICD-10-CM

## 2016-08-14 DIAGNOSIS — IMO0002 Reserved for concepts with insufficient information to code with codable children: Secondary | ICD-10-CM

## 2016-08-14 DIAGNOSIS — E1169 Type 2 diabetes mellitus with other specified complication: Secondary | ICD-10-CM | POA: Diagnosis not present

## 2016-08-14 DIAGNOSIS — K219 Gastro-esophageal reflux disease without esophagitis: Secondary | ICD-10-CM

## 2016-08-14 DIAGNOSIS — M545 Low back pain, unspecified: Secondary | ICD-10-CM

## 2016-08-14 DIAGNOSIS — M810 Age-related osteoporosis without current pathological fracture: Secondary | ICD-10-CM | POA: Diagnosis not present

## 2016-08-14 DIAGNOSIS — E782 Mixed hyperlipidemia: Secondary | ICD-10-CM

## 2016-08-14 DIAGNOSIS — M150 Primary generalized (osteo)arthritis: Secondary | ICD-10-CM

## 2016-08-14 DIAGNOSIS — G8929 Other chronic pain: Secondary | ICD-10-CM

## 2016-08-14 DIAGNOSIS — I10 Essential (primary) hypertension: Secondary | ICD-10-CM | POA: Diagnosis not present

## 2016-08-14 DIAGNOSIS — R809 Proteinuria, unspecified: Secondary | ICD-10-CM | POA: Diagnosis not present

## 2016-08-14 DIAGNOSIS — M159 Polyosteoarthritis, unspecified: Secondary | ICD-10-CM

## 2016-08-14 DIAGNOSIS — R21 Rash and other nonspecific skin eruption: Secondary | ICD-10-CM | POA: Diagnosis not present

## 2016-08-14 DIAGNOSIS — E1165 Type 2 diabetes mellitus with hyperglycemia: Secondary | ICD-10-CM

## 2016-08-14 LAB — CBC WITH DIFFERENTIAL/PLATELET
Basophils Absolute: 66 cells/uL (ref 0–200)
Basophils Relative: 1 %
Eosinophils Absolute: 132 cells/uL (ref 15–500)
Eosinophils Relative: 2 %
HCT: 36.1 % (ref 35.0–45.0)
Hemoglobin: 11.4 g/dL — ABNORMAL LOW (ref 11.7–15.5)
Lymphocytes Relative: 29 %
Lymphs Abs: 1914 cells/uL (ref 850–3900)
MCH: 26.6 pg — ABNORMAL LOW (ref 27.0–33.0)
MCHC: 31.6 g/dL — ABNORMAL LOW (ref 32.0–36.0)
MCV: 84.1 fL (ref 80.0–100.0)
MPV: 9 fL (ref 7.5–12.5)
Monocytes Absolute: 528 cells/uL (ref 200–950)
Monocytes Relative: 8 %
Neutro Abs: 3960 cells/uL (ref 1500–7800)
Neutrophils Relative %: 60 %
Platelets: 331 10*3/uL (ref 140–400)
RBC: 4.29 MIL/uL (ref 3.80–5.10)
RDW: 14.4 % (ref 11.0–15.0)
WBC: 6.6 10*3/uL (ref 3.8–10.8)

## 2016-08-14 LAB — LIPID PANEL
Cholesterol: 132 mg/dL (ref <200)
HDL: 52 mg/dL (ref >50)
LDL Cholesterol: 57 mg/dL (ref <100)
Total CHOL/HDL Ratio: 2.5 Ratio (ref <5.0)
Triglycerides: 117 mg/dL (ref <150)
VLDL: 23 mg/dL (ref <30)

## 2016-08-14 LAB — COMPLETE METABOLIC PANEL WITH GFR
ALT: 6 U/L (ref 6–29)
AST: 9 U/L — ABNORMAL LOW (ref 10–35)
Albumin: 4 g/dL (ref 3.6–5.1)
Alkaline Phosphatase: 56 U/L (ref 33–130)
BUN: 19 mg/dL (ref 7–25)
CO2: 27 mmol/L (ref 20–31)
Calcium: 9.6 mg/dL (ref 8.6–10.4)
Chloride: 105 mmol/L (ref 98–110)
Creat: 1.26 mg/dL — ABNORMAL HIGH (ref 0.60–0.88)
GFR, Est African American: 46 mL/min — ABNORMAL LOW (ref >=60)
GFR, Est Non African American: 40 mL/min — ABNORMAL LOW (ref >=60)
Glucose, Bld: 145 mg/dL — ABNORMAL HIGH (ref 65–99)
Potassium: 5.3 mmol/L (ref 3.5–5.3)
Sodium: 140 mmol/L (ref 135–146)
Total Bilirubin: 0.4 mg/dL (ref 0.2–1.2)
Total Protein: 6.7 g/dL (ref 6.1–8.1)

## 2016-08-14 LAB — POCT GLYCOSYLATED HEMOGLOBIN (HGB A1C): Hemoglobin A1C: 7.5

## 2016-08-14 MED ORDER — AMLODIPINE BESYLATE 10 MG PO TABS: 10.0000 mg | ORAL_TABLET | Freq: Every day | ORAL | 1 refills | Status: DC

## 2016-08-14 MED ORDER — KETOCONAZOLE 2 % EX CREA: 1.0000 "application " | TOPICAL_CREAM | Freq: Every day | CUTANEOUS | 0 refills | Status: DC

## 2016-08-14 MED ORDER — FLUTICASONE PROPIONATE 50 MCG/ACT NA SUSP: 2.0000 | Freq: Every day | NASAL | 1 refills | Status: DC

## 2016-08-14 MED ORDER — METFORMIN HCL 500 MG PO TABS: 500.0000 mg | ORAL_TABLET | Freq: Two times a day (BID) | ORAL | 1 refills | Status: DC

## 2016-08-14 MED ORDER — OMEGA-3-ACID ETHYL ESTERS 1 G PO CAPS: 1.0000 g | ORAL_CAPSULE | Freq: Two times a day (BID) | ORAL | 1 refills | Status: DC

## 2016-08-14 MED ORDER — ALENDRONATE SODIUM 70 MG PO TABS: ORAL_TABLET | ORAL | 11 refills | Status: DC

## 2016-08-14 MED ORDER — RANITIDINE HCL 150 MG PO TABS: 150.0000 mg | ORAL_TABLET | Freq: Every day | ORAL | 0 refills | Status: DC | PRN

## 2016-08-14 NOTE — Progress Notes (Signed)
Name: Taylor [name]   MRN: [id number]    DOB: 10-May-1932   Date:08/14/2016       Progress Note  Subjective  Chief Complaint  Chief Complaint  Patient presents with  . Medication Refill    3 month F/U  . Diabetes    Checks weekly runs on average-130's  . Hypertension    Has edema in feet if she stands too long  . Hyperlipidemia  . Gastroesophageal Reflux    Well controlled    HPI  HTN: bp is at goal today, she denies side effects of medication.  She denies chest pain or  SOB. BP was very high in May while at the pain clinic, but she states she was nervous about her upcoming eye surgery, feeling better now  Hyperlipidemia: she is taking pravastatin and Omega 3 fatty acids,  no decrease in exercise tolerance or SOB, no myalgias . We will recheck labs today   DM: glucose 112-119, highest 150 in am's. She denies polyphagia, polydipsia or polyuria. Eye exam is up to date - we will get records from [facility] center, foot exam done by Dr. Cleda Mccreedy She has microalbuminuria and is taking ACE. She also has dyslipidemia with low HDL, but LDL is at goal on statin therapy, currently also on Lovaza.   GERD: she was  taking Omeprazole daily , she is now taking Ranitidine prn and is doing well, no heartburn or regurgitation at this time, she needs refill of medication  OA: she has intermittent low back pain, also daily right knee pain, and some other joint aches, explained the risk of narcotics,she goes to pain clinic, Dr. Andree Elk. Discussed referral to Ortho but she states she does not want to have another surgery  Rash on face: going on for over one year, she states it clears at times, seen by [facility] Dermatolologist in the past, she has been responding well on Nizoral .   Patient Active Problem List   Diagnosis Date Noted  . Long term current use of opiate analgesic 07/19/2016  . Dyslipidemia associated with type 2 diabetes mellitus ([place]) 04/16/2016  . Chronic midline low back pain without  sciatica 04/16/2016  . Chronic pain of right knee 04/16/2016  . Vaginal dryness 02/22/2015  . Calculus of kidney 11/10/2014  . Osteoarthritis, multiple sites 09/09/2014  . Type 2 diabetes mellitus with microalbuminuria ([place]) 09/09/2014  . Glaucoma 09/09/2014  . BP (high blood pressure) 09/09/2014  . Chronic pain 09/09/2014  . Tinea corporis 09/09/2014  . Umbilical hernia 57/32/2025  . Lump or mass in breast     Past Surgical History:  Procedure Laterality Date  . ABDOMINAL HYSTERECTOMY  1958   menorrhagia  . BACK SURGERY    . CATARACT EXTRACTION, BILATERAL Bilateral    1st 06/07/15 and the 2nd 06/21/15  . CHOLECYSTECTOMY    . COLONOSCOPY  2003   [facility]  . CORONARY ARTERY BYPASS GRAFT Left 01/03/2012,01/24/2012   Left FNA, Left breast [place] bx  . KNEE SURGERY  2009,2011,2012   twice on right and once on left  . KNEE SURGERY    . LIPOMA EXCISION  1998  . [place]  2004  . TONSILLECTOMY     age of 77    Family History  Problem Relation Age of Onset  . Cancer Mother        skin cancer  . Stroke Mother   . Heart disease Father   . Breast cancer Neg Hx     Social  History   Social History  . Marital status: Divorced    Spouse name: N/A  . Number of children: N/A  . Years of education: N/A   Occupational History  . Not on file.   Social History Main Topics  . Smoking status: Never Smoker  . Smokeless tobacco: Never Used  . Alcohol use No  . Drug use: No  . Sexual activity: No   Other Topics Concern  . Not on file   Social History Narrative  . No narrative on file     Current Outpatient Prescriptions:  .  acetaminophen (TYLENOL) 500 MG tablet, Take 1 tablet (500 mg total) by mouth 3 (three) times daily. (Patient taking differently: Take 500 mg by mouth as needed. ), Disp: 90 tablet, Rfl: 0 .  alendronate (FOSAMAX) 70 MG tablet, TAKE 1 TABLET WEEKLY, Disp: 4 tablet, Rfl: 11 .  amLODipine (NORVASC) 10 MG tablet, Take 1 tablet (10 mg total) by mouth  daily., Disp: 90 tablet, Rfl: 1 .  bimatoprost (LUMIGAN) 0.01 % SOLN, Place 1 drop into both eyes daily. , Disp: , Rfl:  .  brimonidine-timolol (COMBIGAN) 0.2-0.5 % ophthalmic solution, Place 1 drop into both eyes See admin instructions. 1 drop into both eyes in the morning, then 1 drop into both eyes 8 hours later, Disp: , Rfl:  .  Bromfenac Sodium 0.09 % SOLN, APPLY 1 DROP INTO AFFECTED EYE DAILY, Disp: , Rfl: 1 .  conjugated estrogens (PREMARIN) vaginal cream, Place 1 Applicatorful vaginally daily., Disp: 42.5 g, Rfl: 5 .  cyclobenzaprine (FLEXERIL) 5 MG tablet, Take 5 mg by mouth 3 (three) times daily., Disp: , Rfl:  .  DUREZOL 0.05 % EMUL, Place 1 drop into both eyes 4 (four) times daily. , Disp: , Rfl:  .  fluticasone (FLONASE) 50 MCG/ACT nasal spray, Place 2 sprays into both nostrils daily., Disp: 16 g, Rfl: 1 .  glucose blood test strip, Use as instructed, Disp: 100 each, Rfl: 12 .  ketoconazole (NIZORAL) 2 % cream, Apply 1 application topically daily., Disp: 15 g, Rfl: 0 .  ketorolac (ACULAR) 0.5 % ophthalmic solution, , Disp: , Rfl:  .  lidocaine (XYLOCAINE) 5 % ointment, Apply 1 application topically 3 (three) times daily as needed. 2 grams, Disp: 35.44 g, Rfl: 0 .  metFORMIN (GLUCOPHAGE) 500 MG tablet, Take 1 tablet (500 mg total) by mouth 2 (two) times daily with a meal., Disp: 180 tablet, Rfl: 1 .  ONETOUCH DELICA LANCETS FINE MISC, 1 each by Does not apply route daily. 30 G Lancets- One Touch Delica, Disp: 244 each, Rfl: 1 .  oxyCODONE-acetaminophen (PERCOCET) 7.5-325 MG tablet, Take 1 tablet by mouth 3 (three) times daily., Disp: 90 tablet, Rfl: 0 .  [START ON 09/09/2016] oxyCODONE-acetaminophen (PERCOCET) 7.5-325 MG tablet, Take 1 tablet by mouth every 6 (six) hours as needed for moderate pain or severe pain., Disp: 90 tablet, Rfl: 0 .  pravastatin (PRAVACHOL) 40 MG tablet, Take 1 tablet (40 mg total) by mouth at bedtime., Disp: 90 tablet, Rfl: 1 .  quinapril (ACCUPRIL) 40 MG tablet,  Take 1 tablet (40 mg total) by mouth daily., Disp: 90 tablet, Rfl: 1 .  BESIVANCE 0.6 % SUSP, 1 DROP IN SURGICAL EYE TWICE A DAY STARTING 1 DAY BEFORE SURGERY, Disp: , Rfl: 1 .  Cholecalciferol (VITAMIN D-1000 MAX ST) 1000 UNITS tablet, Take 1,000 Units by mouth daily. Reported on 08/05/2015, Disp: , Rfl:  .  omega-3 acid ethyl esters (LOVAZA) 1 g capsule, Take  1 capsule (1 g total) by mouth 2 (two) times daily., Disp: 180 capsule, Rfl: 1 .  ranitidine (ZANTAC) 150 MG tablet, Take 1 tablet (150 mg total) by mouth daily as needed for heartburn. Try to take this in place of Omeprazole, Disp: 90 tablet, Rfl: 0  No Known Allergies   ROS  Constitutional: Negative for fever or weight change.  Respiratory: Negative for cough and shortness of breath.   Cardiovascular: Negative for chest pain or palpitations.  Gastrointestinal: Negative for abdominal pain, no bowel changes.  Musculoskeletal: Positive  for gait problem and  joint swelling.  Skin: Positive  for rash - face, but improving.  Neurological: Negative for dizziness or headache.  No other specific complaints in a complete review of systems (except as listed in HPI above).  Objective  Vitals:   08/14/16 0929  BP: 138/68  Pulse: 73  Resp: 16  Temp: 98 F (36.7 C)  TempSrc: Oral  SpO2: 97%  Weight: 208 lb 3.2 oz (94.4 kg)  Height: 5\' 1"  (1.549 m)    Body mass index is 39.34 kg/m.  Physical Exam  Constitutional: Patient appears well-developed and well-nourished. Obese No distress.  HEENT: head atraumatic, normocephalic, pupils equal and reactive to light, neck supple, throat within normal limits Cardiovascular: Normal rate, regular rhythm and normal heart sounds. No murmur heard. Trace BL ankle  edema. Pulmonary/Chest: Effort normal and breath sounds normal. No respiratory distress. Abdominal: Soft. There is no tenderness. Psychiatric: Patient has a normal mood and affect. behavior is normal. Judgment and thought content  normal. Muscular skeletal: uses cane, pain with rom of right knee Skin: hypopigmentation and scaly area on right lower jaw.   Recent Results (from the past 2160 hour(s))  POCT HgB A1C     Status: Abnormal   Collection Time: 08/14/16  9:37 AM  Result Value Ref Range   Hemoglobin A1C 7.5      PHQ2/9: Depression screen [facility] 2/9 08/14/2016 07/19/2016 04/16/2016 03/27/2016 02/29/2016  Decreased Interest 0 0 0 0 0  Down, Depressed, Hopeless 0 0 0 0 0  PHQ - 2 Score 0 0 0 0 0     Fall Risk: Fall Risk  08/14/2016 07/19/2016 04/16/2016 03/27/2016 02/29/2016  Falls in the past year? No No No No No  Number falls in past yr: - - - - -  Injury with Fall? - - - - -     Functional Status Survey: Is the patient deaf or have difficulty hearing?: Yes Does the patient have difficulty seeing, even when wearing glasses/contacts?: No Does the patient have difficulty concentrating, remembering, or making decisions?: No Does the patient have difficulty walking or climbing stairs?: Yes Does the patient have difficulty dressing or bathing?: No Does the patient have difficulty doing errands alone such as visiting a doctor's office or shopping?: No    Assessment & Plan  1. Type 2 diabetes mellitus with microalbuminuria, without long-term current use of insulin (HCC)  She went down to one Metformin daily because she felt like it was dropping her sugar. hgbA1C is slightly better and because of her age hgbA1C at 7.5 should be okay - POCT HgB A1C - VITAMIN D 25 Hydroxy (Vit-D Deficiency, Fractures)  2. Essential hypertension  At goal  - CBC with Differential/Platelet - amLODipine (NORVASC) 10 MG tablet; Take 1 tablet (10 mg total) by mouth daily.  Dispense: 90 tablet; Refill: 1  3. Dyslipidemia associated with type 2 diabetes mellitus (HCC)  - Lipid panel  4. Chronic  midline low back pain without sciatica  Continue follow up with Dr. Andree Elk  5. Chronic pain of right knee   6. Primary osteoarthritis  involving multiple joints   7. Mixed hyperlipidemia  - omega-3 acid ethyl esters (LOVAZA) 1 g capsule; Take 1 capsule (1 g total) by mouth 2 (two) times daily.  Dispense: 180 capsule; Refill: 1  8. Uncontrolled type 2 diabetes mellitus with microalbuminuria, without long-term current use of insulin (HCC)  - metFORMIN (GLUCOPHAGE) 500 MG tablet; Take 1 tablet (500 mg total) by mouth 2 (two) times daily with a meal.  Dispense: 180 tablet; Refill: 1  9. Rash of face  Improving  - ketoconazole (NIZORAL) 2 % cream; Apply 1 application topically daily.  Dispense: 15 g; Refill: 0  10. Gastroesophageal reflux disease without esophagitis  Doing well, off Omeprazole and only taking Ranitidine prn  - ranitidine (ZANTAC) 150 MG tablet; Take 1 tablet (150 mg total) by mouth daily as needed for heartburn. Try to take this in place of Omeprazole  Dispense: 90 tablet; Refill: 0  11. Stage 2 chronic kidney disease  - COMPLETE METABOLIC PANEL WITH GFR  12. Perennial allergic rhinitis  - fluticasone (FLONASE) 50 MCG/ACT nasal spray; Place 2 sprays into both nostrils daily.  Dispense: 16 g; Refill: 1  13. Age-related osteoporosis without current pathological fracture  - alendronate (FOSAMAX) 70 MG tablet; TAKE 1 TABLET WEEKLY  Dispense: 4 tablet; Refill: 11

## 2016-08-15 LAB — VITAMIN D 25 HYDROXY (VIT D DEFICIENCY, FRACTURES): Vit D, 25-Hydroxy: 16 ng/mL — ABNORMAL LOW (ref 30–100)

## 2016-08-17 ENCOUNTER — Encounter: Payer: Self-pay | Admitting: Family Medicine

## 2016-08-17 DIAGNOSIS — H35033 Hypertensive retinopathy, bilateral: Secondary | ICD-10-CM

## 2016-08-17 DIAGNOSIS — H401133 Primary open-angle glaucoma, bilateral, severe stage: Secondary | ICD-10-CM | POA: Insufficient documentation

## 2016-08-17 DIAGNOSIS — H35039 Hypertensive retinopathy, unspecified eye: Secondary | ICD-10-CM | POA: Insufficient documentation

## 2016-08-19 ENCOUNTER — Other Ambulatory Visit: Payer: Self-pay | Admitting: Family Medicine

## 2016-08-19 DIAGNOSIS — D649 Anemia, unspecified: Secondary | ICD-10-CM | POA: Insufficient documentation

## 2016-08-19 DIAGNOSIS — E559 Vitamin D deficiency, unspecified: Secondary | ICD-10-CM | POA: Insufficient documentation

## 2016-08-19 MED ORDER — VITAMIN D (ERGOCALCIFEROL) 1.25 MG (50000 UNIT) PO CAPS: 50000.0000 [IU] | ORAL_CAPSULE | ORAL | 0 refills | Status: DC

## 2016-08-30 ENCOUNTER — Other Ambulatory Visit: Payer: Self-pay

## 2016-08-30 DIAGNOSIS — M810 Age-related osteoporosis without current pathological fracture: Secondary | ICD-10-CM

## 2016-08-30 NOTE — Telephone Encounter (Signed)
Patient requesting refill of Fosamax to [facility].  

## 2016-08-31 MED ORDER — ALENDRONATE SODIUM 70 MG PO TABS: ORAL_TABLET | ORAL | 11 refills | Status: DC

## 2016-09-05 ENCOUNTER — Other Ambulatory Visit: Payer: Self-pay | Admitting: Family Medicine

## 2016-09-05 DIAGNOSIS — M810 Age-related osteoporosis without current pathological fracture: Secondary | ICD-10-CM

## 2016-09-05 MED ORDER — ALENDRONATE SODIUM 70 MG PO TABS: ORAL_TABLET | ORAL | 0 refills | Status: DC

## 2016-09-05 NOTE — Telephone Encounter (Signed)
PT NEEDS REFILL ON HER ALENDRONATE. SHE SAID THAT THE PHARM HAS SENT SEVERAL REQUEST AND THE PHARM CALLED HER AND SAID THAT THE DR HAS NOT RESPONDED YET. THEY WANTED HER TO CALL AND LET THE DR KNOW.

## 2016-09-13 ENCOUNTER — Encounter: Payer: Self-pay | Admitting: Nurse Practitioner

## 2016-09-13 ENCOUNTER — Ambulatory Visit: Payer: Medicare Other | Attending: Nurse Practitioner | Admitting: Nurse Practitioner

## 2016-09-13 VITALS — BP 149/67 | HR 73 | Temp 98.5°F | Resp 16 | Ht 61.0 in | Wt 208.0 lb

## 2016-09-13 DIAGNOSIS — E785 Hyperlipidemia, unspecified: Secondary | ICD-10-CM | POA: Diagnosis not present

## 2016-09-13 DIAGNOSIS — M545 Low back pain, unspecified: Secondary | ICD-10-CM

## 2016-09-13 DIAGNOSIS — E11319 Type 2 diabetes mellitus with unspecified diabetic retinopathy without macular edema: Secondary | ICD-10-CM | POA: Insufficient documentation

## 2016-09-13 DIAGNOSIS — G894 Chronic pain syndrome: Secondary | ICD-10-CM | POA: Diagnosis not present

## 2016-09-13 DIAGNOSIS — M109 Gout, unspecified: Secondary | ICD-10-CM | POA: Insufficient documentation

## 2016-09-13 DIAGNOSIS — Z7951 Long term (current) use of inhaled steroids: Secondary | ICD-10-CM | POA: Insufficient documentation

## 2016-09-13 DIAGNOSIS — Z951 Presence of aortocoronary bypass graft: Secondary | ICD-10-CM | POA: Insufficient documentation

## 2016-09-13 DIAGNOSIS — Z823 Family history of stroke: Secondary | ICD-10-CM | POA: Diagnosis not present

## 2016-09-13 DIAGNOSIS — Z87442 Personal history of urinary calculi: Secondary | ICD-10-CM | POA: Diagnosis not present

## 2016-09-13 DIAGNOSIS — M1991 Primary osteoarthritis, unspecified site: Secondary | ICD-10-CM | POA: Insufficient documentation

## 2016-09-13 DIAGNOSIS — M25562 Pain in left knee: Secondary | ICD-10-CM | POA: Diagnosis not present

## 2016-09-13 DIAGNOSIS — E559 Vitamin D deficiency, unspecified: Secondary | ICD-10-CM | POA: Diagnosis not present

## 2016-09-13 DIAGNOSIS — I10 Essential (primary) hypertension: Secondary | ICD-10-CM | POA: Diagnosis not present

## 2016-09-13 DIAGNOSIS — Z7984 Long term (current) use of oral hypoglycemic drugs: Secondary | ICD-10-CM | POA: Insufficient documentation

## 2016-09-13 DIAGNOSIS — Z7983 Long term (current) use of bisphosphonates: Secondary | ICD-10-CM | POA: Insufficient documentation

## 2016-09-13 DIAGNOSIS — G8929 Other chronic pain: Secondary | ICD-10-CM | POA: Diagnosis not present

## 2016-09-13 DIAGNOSIS — Z6839 Body mass index (BMI) 39.0-39.9, adult: Secondary | ICD-10-CM | POA: Insufficient documentation

## 2016-09-13 DIAGNOSIS — Z8249 Family history of ischemic heart disease and other diseases of the circulatory system: Secondary | ICD-10-CM | POA: Insufficient documentation

## 2016-09-13 DIAGNOSIS — H409 Unspecified glaucoma: Secondary | ICD-10-CM | POA: Insufficient documentation

## 2016-09-13 DIAGNOSIS — M159 Polyosteoarthritis, unspecified: Secondary | ICD-10-CM

## 2016-09-13 DIAGNOSIS — K219 Gastro-esophageal reflux disease without esophagitis: Secondary | ICD-10-CM | POA: Diagnosis not present

## 2016-09-13 DIAGNOSIS — Z96653 Presence of artificial knee joint, bilateral: Secondary | ICD-10-CM | POA: Insufficient documentation

## 2016-09-13 DIAGNOSIS — Z79891 Long term (current) use of opiate analgesic: Secondary | ICD-10-CM | POA: Diagnosis not present

## 2016-09-13 DIAGNOSIS — E669 Obesity, unspecified: Secondary | ICD-10-CM | POA: Diagnosis not present

## 2016-09-13 DIAGNOSIS — M79641 Pain in right hand: Secondary | ICD-10-CM | POA: Insufficient documentation

## 2016-09-13 DIAGNOSIS — H35039 Hypertensive retinopathy, unspecified eye: Secondary | ICD-10-CM | POA: Insufficient documentation

## 2016-09-13 DIAGNOSIS — M810 Age-related osteoporosis without current pathological fracture: Secondary | ICD-10-CM | POA: Insufficient documentation

## 2016-09-13 DIAGNOSIS — M25561 Pain in right knee: Secondary | ICD-10-CM | POA: Diagnosis not present

## 2016-09-13 DIAGNOSIS — Z79899 Other long term (current) drug therapy: Secondary | ICD-10-CM | POA: Diagnosis not present

## 2016-09-13 DIAGNOSIS — M150 Primary generalized (osteo)arthritis: Secondary | ICD-10-CM

## 2016-09-13 DIAGNOSIS — M8949 Other hypertrophic osteoarthropathy, multiple sites: Secondary | ICD-10-CM

## 2016-09-13 DIAGNOSIS — M79642 Pain in left hand: Secondary | ICD-10-CM | POA: Insufficient documentation

## 2016-09-13 MED ORDER — OXYCODONE-ACETAMINOPHEN 7.5-325 MG PO TABS: 1.0000 | ORAL_TABLET | Freq: Four times a day (QID) | ORAL | 0 refills | Status: DC | PRN

## 2016-09-13 NOTE — Patient Instructions (Signed)

## 2016-09-13 NOTE — Progress Notes (Signed)
Nursing Pain Medication Assessment:  Safety precautions to be maintained throughout the outpatient stay will include: orient to surroundings, keep bed in low position, maintain call bell within reach at all times, provide assistance with transfer out of bed and ambulation.  Medication Inspection Compliance: Pill count conducted under aseptic conditions, in front of the patient. Neither the pills nor the bottle was removed from the patient's sight at any time. Once count was completed pills were immediately returned to the patient in their original bottle.  Medication: Oxycodone/APAP Pill/Patch Count: 80 of 90 pills remain Pill/Patch Appearance: Markings consistent with prescribed medication Bottle Appearance: Standard pharmacy container. Clearly labeled. Filled Date: 07 / 23 / 2018 Last Medication intake:  Today

## 2016-09-13 NOTE — Progress Notes (Signed)
Patient's Name: Taylor [name]  MRN: [id number]  Referring Provider: [name], MD  DOB: November 18, 1932  PCP: [name], MD  DOS: 09/13/2016  Note by: Vevelyn Francois NP  Service setting: Ambulatory outpatient  Specialty: Interventional Pain Management  Location: [facility] (AMB) Pain Management Facility    Patient type: Established    Primary Reason(s) for Visit: Encounter for prescription drug management. (Level of risk: moderate)  CC: Knee Pain (both right worse) and Hand Pain (both, right worse)  HPI  [name] is a 83 y.o. year old, female patient, who comes today for a medication management evaluation. She has Lump or mass in breast; Umbilical hernia; Osteoarthritis, multiple sites; Type 2 diabetes mellitus with microalbuminuria ([place]); Glaucoma; BP (high blood pressure); Chronic pain; Tinea corporis; Calculus of kidney; Vaginal dryness; Dyslipidemia associated with type 2 diabetes mellitus ([place]); Chronic midline low back pain without sciatica; Chronic pain of right knee; Long term current use of opiate analgesic; Primary open angle glaucoma (POAG) of both eyes, severe stage; Hypertensive retinopathy; Vitamin D deficiency; Anemia, unspecified; and Chronic pain syndrome on her problem list. Her primarily concern today is the Knee Pain (both right worse) and Hand Pain (both, right worse)  Pain Assessment: Location: Right, Left (right worse) Knee Radiating: stays in knee Onset: More than a month ago Duration: Chronic pain Quality: Aching, Constant Severity: 7 /10 (self-reported pain score)  Note: Reported level is compatible with observation.                   Effect on ADL: pace self Timing: Constant Modifying factors: medicine , voltaren gel , heating pad  Taylor [name] was last scheduled for an appointment on 07/19/2016 for medication management. During today's appointment we reviewed Taylor [name]'s chronic pain status, as well as her outpatient medication regimen. She is SP bilateral knee  replacemtns with revision on the right. She admits that she has swelling. She feels like it gets worse at night. She uses heat and topical creams. She also admits that her hands have been very paniful this week. She has had cramping but she denies any numbneess and tingling. She admits that she does use arthritic gloves at night which provides relief.  She declines any procedure at this time.  The patient  reports that she does not use drugs. Her body mass index is 39.3 kg/m.  Further details on both, my assessment(s), as well as the proposed treatment plan, please see below.  Controlled Substance Pharmacotherapy Assessment REMS (Risk Evaluation and Mitigation Strategy)  Analgesic: Oxycodone/acetaminophen 7.5/325 mg 4 times daily MME/day: 30 mg/day.  Lona Millard, RN  09/13/2016  2:13 PM  Sign at close encounter Nursing Pain Medication Assessment:  Safety precautions to be maintained throughout the outpatient stay will include: orient to surroundings, keep bed in low position, maintain call bell within reach at all times, provide assistance with transfer out of bed and ambulation.  Medication Inspection Compliance: Pill count conducted under aseptic conditions, in front of the patient. Neither the pills nor the bottle was removed from the patient's sight at any time. Once count was completed pills were immediately returned to the patient in their original bottle.  Medication: Oxycodone/APAP Pill/Patch Count: 80 of 90 pills remain Pill/Patch Appearance: Markings consistent with prescribed medication Bottle Appearance: Standard pharmacy container. Clearly labeled. Filled Date: 07 / 23 / 2018 Last Medication intake:  Today   Pharmacokinetics: Liberation and absorption (onset of action): WNL Distribution (time to peak effect): WNL Metabolism and excretion (  duration of action): WNL         Pharmacodynamics: Desired effects: Analgesia: Taylor [name] reports >50% benefit. Functional ability:  Patient reports that medication allows her to accomplish basic ADLs Clinically meaningful improvement in function (CMIF): Sustained CMIF goals met Perceived effectiveness: Described as relatively effective, allowing for increase in activities of daily living (ADL) Undesirable effects: Side-effects or Adverse reactions: None reported Monitoring: [place] PMP: Online review of the past 72-monthperiod conducted. Compliant with practice rules and regulations List of all UDS test(s) done:  Lab Results  Component Value Date   TOXASSSELUR FINAL 01/31/2016   [place] 11/14/2015   SUMMARY FINAL 09/13/2016   Last UDS on record: ToxAssure Select 13  Date Value Ref Range Status  01/31/2016 FINAL  Final    Comment:    ==================================================================== TOXASSURE SELECT 13 (MW) ==================================================================== Test                             Result       Flag       Units Drug Present and Declared for Prescription Verification   Oxycodone                      528          EXPECTED   ng/mg creat   Oxymorphone                    596          EXPECTED   ng/mg creat   Noroxycodone                   699          EXPECTED   ng/mg creat   Noroxymorphone                 240          EXPECTED   ng/mg creat    Sources of oxycodone are scheduled prescription medications.    Oxymorphone, noroxycodone, and noroxymorphone are expected    metabolites of oxycodone. Oxymorphone is also available as a    scheduled prescription medication. ==================================================================== Test                      Result    Flag   Units      Ref Range   Creatinine              126              mg/dL      >=20 ==================================================================== Declared Medications:  The flagging and interpretation on this report are based on the  following declared medications.  Unexpected results may  arise from  inaccuracies in the declared medications.  **Note: The testing scope of this panel includes these medications:  Oxycodone (Percocet)  **Note: The testing scope of this panel does not include following  reported medications:  Acetaminophen (Percocet)  Acetaminophen (Tylenol)  Alendronate (Fosamax)  Amlodipine (Norvasc)  Bimatoprost (Lumigan)  Bromfenac (Prolensa)  Cholecalciferol  Cyclobenzaprine (Flexeril)  Estrogen (Premarin)  Eye Drops  Fluticasone (Flonase)  Metformin  Omeprazole  Pravastatin (Pravachol)  Quinapril  Ranitidine (Zantac) ==================================================================== For clinical consultation, please call ([phone] ====================================================================    Summary  Date Value Ref Range Status  09/13/2016 FINAL  Final    Comment:    ==================================================================== TOXASSURE SELECT 13 (MW) ==================================================================== Test  Result       Flag       Units Drug Present and Declared for Prescription Verification   Oxycodone                      175          EXPECTED   ng/mg creat   Oxymorphone                    591          EXPECTED   ng/mg creat   Noroxycodone                   644          EXPECTED   ng/mg creat   Noroxymorphone                 234          EXPECTED   ng/mg creat    Sources of oxycodone are scheduled prescription medications.    Oxymorphone, noroxycodone, and noroxymorphone are expected    metabolites of oxycodone. Oxymorphone is also available as a    scheduled prescription medication. ==================================================================== Test                      Result    Flag   Units      Ref Range   Creatinine              125              mg/dL      >=20 ==================================================================== Declared Medications:   The flagging and interpretation on this report are based on the  following declared medications.  Unexpected results may arise from  inaccuracies in the declared medications.  **Note: The testing scope of this panel includes these medications:  Oxycodone (Percocet)  **Note: The testing scope of this panel does not include following  reported medications:  Acetaminophen (Percocet)  Acetaminophen (Tylenol)  Alendronate (Fosamax)  Amlodipine (Norvasc)  Bimatoprost (Lumigan)  Cholecalciferol  Cyclobenzaprine (Flexeril)  Estrogen (Premarin)  Fluticasone (Flonase)  Ketoconazole (Nizoral)  Lidocaine (Xylocaine)  Metformin  Omega-3 Fatty Acids (Lovaza)  Pravastatin (Pravachol)  Quinapril (Accupril)  Ranitidine (Zantac)  Vitamin D2 (Drisdol) ==================================================================== For clinical consultation, please call [phone]. ====================================================================    UDS interpretation: Compliant          Medication Assessment Form: Reviewed. Patient indicates being compliant with therapy Treatment compliance: Compliant Risk Assessment Profile: Aberrant behavior: See prior evaluations. None observed or detected today Comorbid factors increasing risk of overdose: See prior notes. No additional risks detected today Risk of substance use disorder (SUD): Low Opioid Risk Tool (ORT) Total Score: 0  Interpretation Table:  Score <3 = Low Risk for SUD  Score between 4-7 = Moderate Risk for SUD  Score >8 = High Risk for Opioid Abuse   Risk Mitigation Strategies:  Patient Counseling: Covered Patient-Prescriber Agreement (PPA): Present and active  Notification to other healthcare providers: Done  Pharmacologic Plan: No change in therapy, at this time  Laboratory Chemistry  Inflammation Markers (CRP: Acute Phase) (ESR: Chronic Phase) No results found for: CRP, ESRSEDRATE               Renal Function Markers Lab  Results  Component Value Date   BUN 19 08/14/2016   CREATININE 1.26 (H) 08/14/2016   GFRAA 46 (L) 08/14/2016   GFRNONAA 40 (L) 08/14/2016  Hepatic Function Markers Lab Results  Component Value Date   AST 9 (L) 08/14/2016   ALT 6 08/14/2016   ALBUMIN 4.0 08/14/2016   ALKPHOS 56 08/14/2016                 Electrolytes Lab Results  Component Value Date   NA 140 08/14/2016   K 5.3 08/14/2016   CL 105 08/14/2016   CALCIUM 9.6 08/14/2016                 Neuropathy Markers No results found for: [id number]               Bone Pathology Markers Lab Results  Component Value Date   ALKPHOS 56 08/14/2016   VD25OH 16 (L) 08/14/2016   CALCIUM 9.6 08/14/2016                 Coagulation Parameters Lab Results  Component Value Date   PLT 331 08/14/2016                 Cardiovascular Markers Lab Results  Component Value Date   HGB 11.4 (L) 08/14/2016   HCT 36.1 08/14/2016                 Note: Lab results reviewed.  Recent Diagnostic Imaging Review  US Breast Ltd Uni Right Inc Axilla  Result Date: 08/09/2016 CLINICAL DATA:  The patient was called back for a right breast mass EXAM: DIGITAL DIAGNOSTIC RIGHT MAMMOGRAM ULTRASOUND RIGHT BREAST COMPARISON:  Previous exam(s). ACR Breast Density Category b: There are scattered areas of fibroglandular density. FINDINGS: The right breast mass persists on additional imaging. On physical exam, no suspicious lumps are identified. Targeted ultrasound is performed, showing fibrocystic changes accounting for the right breast mass. IMPRESSION: No mammographic or sonographic evidence of malignancy. RECOMMENDATION: Annual screening mammography. I have discussed the findings and recommendations with the patient. Results were also provided in writing at the conclusion of the visit. If applicable, a reminder letter will be sent to the patient regarding the next appointment. BI-RADS CATEGORY  2: Benign. Electronically Signed   By: Dorise Bullion III M.D   On: 08/09/2016 12:38   Mm Diag Breast Tomo Uni Right  Result Date: 08/09/2016 CLINICAL DATA:  The patient was called back for a right breast mass EXAM: DIGITAL DIAGNOSTIC RIGHT MAMMOGRAM ULTRASOUND RIGHT BREAST COMPARISON:  Previous exam(s). ACR Breast Density Category b: There are scattered areas of fibroglandular density. FINDINGS: The right breast mass persists on additional imaging. On physical exam, no suspicious lumps are identified. Targeted ultrasound is performed, showing fibrocystic changes accounting for the right breast mass. IMPRESSION: No mammographic or sonographic evidence of malignancy. RECOMMENDATION: Annual screening mammography. I have discussed the findings and recommendations with the patient. Results were also provided in writing at the conclusion of the visit. If applicable, a reminder letter will be sent to the patient regarding the next appointment. BI-RADS CATEGORY  2: Benign. Electronically Signed   By: Dorise Bullion III M.D   On: 08/09/2016 12:38   Note: Imaging results reviewed.          Meds   Current Meds  Medication Sig  . acetaminophen (TYLENOL) 500 MG tablet Take 1 tablet (500 mg total) by mouth 3 (three) times daily. (Patient taking differently: Take 500 mg by mouth as needed. )  . alendronate (FOSAMAX) 70 MG tablet TAKE 1 TABLET WEEKLY  . amLODipine (NORVASC) 10 MG tablet Take 1 tablet (10 mg total) by mouth  daily.  [name] BESIVANCE 0.6 % SUSP 1 DROP IN SURGICAL EYE TWICE A DAY STARTING 1 DAY BEFORE SURGERY  . bimatoprost (LUMIGAN) 0.01 % SOLN Place 1 drop into both eyes daily.   . brimonidine-timolol (COMBIGAN) 0.2-0.5 % ophthalmic solution Place 1 drop into both eyes See admin instructions. 1 drop into both eyes in the morning, then 1 drop into both eyes 8 hours later  . Bromfenac Sodium 0.09 % SOLN APPLY 1 DROP INTO AFFECTED EYE DAILY  . Cholecalciferol (VITAMIN D-1000 MAX ST) 1000 UNITS tablet Take 1,000 Units by mouth daily. Reported on  08/05/2015  . conjugated estrogens (PREMARIN) vaginal cream Place 1 Applicatorful vaginally daily.  . cyclobenzaprine (FLEXERIL) 5 MG tablet Take 5 mg by mouth 3 (three) times daily.  . DUREZOL 0.05 % EMUL Place 1 drop into both eyes 4 (four) times daily.   . fluticasone (FLONASE) 50 MCG/ACT nasal spray Place 2 sprays into both nostrils daily.  [name] glucose blood test strip Use as instructed  . ketoconazole (NIZORAL) 2 % cream Apply 1 application topically daily.  [name] ketorolac (ACULAR) 0.5 % ophthalmic solution   . lidocaine (XYLOCAINE) 5 % ointment Apply 1 application topically 3 (three) times daily as needed. 2 grams  . metFORMIN (GLUCOPHAGE) 500 MG tablet Take 1 tablet (500 mg total) by mouth 2 (two) times daily with a meal.  . omega-3 acid ethyl esters (LOVAZA) 1 g capsule Take 1 capsule (1 g total) by mouth 2 (two) times daily.  [name] DELICA LANCETS FINE MISC 1 each by Does not apply route daily. [place]  . [START ON 11/08/2016] oxyCODONE-acetaminophen (PERCOCET) 7.5-325 MG tablet Take 1 tablet by mouth every 6 (six) hours as needed for moderate pain or severe pain.  . pravastatin (PRAVACHOL) 40 MG tablet Take 1 tablet (40 mg total) by mouth at bedtime.  . quinapril (ACCUPRIL) 40 MG tablet Take 1 tablet (40 mg total) by mouth daily.  . ranitidine (ZANTAC) 150 MG tablet Take 1 tablet (150 mg total) by mouth daily as needed for heartburn. Try to take this in place of Omeprazole  . Vitamin D, Ergocalciferol, (DRISDOL) 50000 units CAPS capsule Take 1 capsule (50,000 Units total) by mouth every 7 (seven) days.  . [DISCONTINUED] oxyCODONE-acetaminophen (PERCOCET) 7.5-325 MG tablet Take 1 tablet by mouth every 6 (six) hours as needed for moderate pain or severe pain.  . [DISCONTINUED] oxyCODONE-acetaminophen (PERCOCET) 7.5-325 MG tablet Take 1 tablet by mouth every 6 (six) hours as needed for moderate pain or severe pain.    ROS  Constitutional: Denies any fever or  chills Gastrointestinal: No reported hemesis, hematochezia, vomiting, or acute GI distress Musculoskeletal: Denies any acute onset joint swelling, redness, loss of ROM, or weakness Neurological: No reported episodes of acute onset apraxia, aphasia, dysarthria, agnosia, amnesia, paralysis, loss of coordination, or loss of consciousness  Allergies  Ms. Neria has No Known Allergies.  [place]  Drug: Ms. Bluett  reports that she does not use drugs. Alcohol:  reports that she does not drink alcohol. Tobacco:  reports that she has never smoked. She has never used smokeless tobacco. Medical:  has a past medical history of Abnormal mammogram, unspecified (2013); Breast screening, unspecified (2013); Diabetes mellitus without complication ([place]) (9798); GERD (gastroesophageal reflux disease); Glaucoma (2003); Gout; Hyperlipidemia (2008); Hypertension (1980's); Lump or mass in breast (01/03/2012); Obesity, unspecified (2013); Osteoporosis; Shingles (2013); and Special screening for malignant neoplasms, colon (2013). Surgical: Ms. Neises  has a past surgical history  that includes Abdominal hysterectomy (1958); Knee surgery (2009,2011,2012); Lipoma excision (1998); Spine surgery (2004); Tonsillectomy; Colonoscopy (2003); Coronary artery bypass graft (Left, 01/03/2012,01/24/2012); Cataract extraction, bilateral (Bilateral); Cholecystectomy; Back surgery; and Knee surgery. Family: family history includes Cancer in her mother; Heart disease in her father; Stroke in her mother.  Constitutional Exam  General appearance: Well nourished, well developed, and well hydrated. In no apparent acute distress Vitals:   09/13/16 1400  BP: (!) 149/67  Pulse: 73  Resp: 16  Temp: 98.5 F (36.9 C)  SpO2: 100%  Weight: 208 lb (94.3 kg)  Height: 5' 1"  (1.549 m)   BMI Assessment: Estimated body mass index is 39.3 kg/m as calculated from the following:   Height as of this encounter: 5' 1"  (1.549 m).   Weight as of this  encounter: 208 lb (94.3 kg).  BMI interpretation table: BMI level Category Range association with higher incidence of chronic pain  <18 kg/m2 Underweight   18.5-24.9 kg/m2 Ideal body weight   25-29.9 kg/m2 Overweight Increased incidence by 20%  30-34.9 kg/m2 Obese (Class I) Increased incidence by 68%  35-39.9 kg/m2 Severe obesity (Class II) Increased incidence by 136%  >40 kg/m2 Extreme obesity (Class III) Increased incidence by 254%   BMI Readings from Last 4 Encounters:  09/13/16 39.30 kg/m  08/14/16 39.34 kg/m  07/19/16 39.68 kg/m  05/22/16 36.99 kg/m   Wt Readings from Last 4 Encounters:  09/13/16 208 lb (94.3 kg)  08/14/16 208 lb 3.2 oz (94.4 kg)  07/19/16 210 lb (95.3 kg)  05/22/16 199 lb (90.3 kg)  Psych/Mental status: Alert, oriented x 3 (person, place, & time)       Eyes: PERLA Respiratory: No evidence of acute respiratory distress  Cervical Spine Exam  Inspection: No masses, redness, or swelling Alignment: Symmetrical Functional ROM: Unrestricted ROM      Stability: No instability detected Muscle strength & Tone: Functionally intact Sensory: Unimpaired Palpation: No palpable anomalies              Upper Extremity (UE) Exam    Side: Right upper extremity  Side: Left upper extremity  Inspection: No masses, redness, swelling, or asymmetry. No contractures  Inspection: No masses, redness, swelling, or asymmetry. No contractures  Functional ROM: Unrestricted ROM          Functional ROM: Unrestricted ROM          Muscle strength & Tone: Functionally intact  Muscle strength & Tone: Functionally intact  Sensory: Unimpaired  Sensory: Unimpaired  Palpation: No palpable anomalies              Palpation: No palpable anomalies              Specialized Test(s): Deferred         Specialized Test(s): Deferred          Thoracic Spine Exam  Inspection: No masses, redness, or swelling Alignment: Symmetrical Functional ROM: Unrestricted ROM Stability: No instability  detected Sensory: Unimpaired Muscle strength & Tone: No palpable anomalies  Lumbar Spine Exam  Inspection: No masses, redness, or swelling Alignment: Symmetrical Functional ROM: Unrestricted ROM      Stability: No instability detected Muscle strength & Tone: Functionally intact Sensory: Unimpaired Palpation: No palpable anomalies       Provocative Tests: Lumbar Hyperextension and rotation test: evaluation deferred today       Lumbar Lateral bending test: evaluation deferred today       Patrick's Maneuver: evaluation deferred today  Gait & Posture Assessment  Ambulation: Patient ambulates using a cane Gait: Relatively normal for age and body habitus Posture: WNL   Lower Extremity Exam    Side: Right lower extremity  Side: Left lower extremity  Inspection: Scar from previous surgery  Inspection: Scar from previous surgery  Functional ROM: Unrestricted ROM          Functional ROM: Unrestricted ROM          Muscle strength & Tone: Functionally intact  Muscle strength & Tone: Functionally intact  Sensory: Unimpaired  Sensory: Unimpaired  Palpation: No palpable anomalies  Palpation: No palpable anomalies   Assessment  Primary Diagnosis & Pertinent Problem List: The primary encounter diagnosis was Chronic pain of right knee. Diagnoses of Chronic midline low back pain without sciatica, Primary osteoarthritis involving multiple joints, Chronic pain syndrome, and Long term current use of opiate analgesic were also pertinent to this visit.  Status Diagnosis  Controlled Controlled Controlled 1. Chronic pain of right knee   2. Chronic midline low back pain without sciatica   3. Primary osteoarthritis involving multiple joints   4. Chronic pain syndrome   5. Long term current use of opiate analgesic     Problems updated and reviewed during this visit: No problems updated. Plan of Care  Pharmacotherapy (Medications Ordered): Meds ordered this encounter  Medications   . DISCONTD: oxyCODONE-acetaminophen (PERCOCET) 7.5-325 MG tablet    Sig: Take 1 tablet by mouth every 6 (six) hours as needed for moderate pain or severe pain.    Dispense:  90 tablet    Refill:  0    Do not place this medication, or any other prescription from our practice, on "Automatic Refill". Patient may have prescription filled one day early if pharmacy is closed on scheduled refill date. Do not fill until:10/09/2016 To last until:11/08/16    Order Specific Question:   Supervising Provider    Answer:   [name] [[id number]]  . oxyCODONE-acetaminophen (PERCOCET) 7.5-325 MG tablet    Sig: Take 1 tablet by mouth every 6 (six) hours as needed for moderate pain or severe pain.    Dispense:  90 tablet    Refill:  0    Do not place this medication, or any other prescription from our practice, on "Automatic Refill". Patient may have prescription filled one day early if pharmacy is closed on scheduled refill date. Do not fill until:11/08/2016 To last until:12/08/16    Order Specific Question:   Supervising Provider    Answer:   Theressa Stamps   New Prescriptions   No medications on file   Medications administered today: Ms. Chaudhuri had no medications administered during this visit. Lab-work, procedure(s), and/or referral(s): Orders Placed This Encounter  Procedures  . ToxASSURE Select 13 (MW), Urine   Imaging and/or referral(s): None  Interventional therapies: Planned, scheduled, and/or pending:   Not at this time.   Considering:   Not at this time   Palliative PRN treatment(s):   Not at this time   Provider-requested follow-up: Return in about 2 months (around 11/14/2016) for MedMgmt.  Future Appointments Date Time Provider [place]  11/13/2016 1:30 PM Vevelyn Francois, NP [facility] None  12/14/2016 10:00 AM [name], MD [place] None  06/10/2017 1:45 PM Hayden Pedro, MD [facility] None   Primary Care Physician: [name], MD Location[facility]  Outpatient Pain Management Facility Note by: Vevelyn Francois NP Date: 09/13/2016; Time: 4:13 PM  Pain Score Disclaimer: We use the  NRS-11 scale. This is a self-reported, subjective measurement of pain severity with only modest accuracy. It is used primarily to identify changes within a particular patient. It must be understood that outpatient pain scales are significantly less accurate that those used for research, where they can be applied under ideal controlled circumstances with minimal exposure to variables. In reality, the score is likely to be a combination of pain intensity and pain affect, where pain affect describes the degree of emotional arousal or changes in action readiness caused by the sensory experience of pain. Factors such as social and work situation, setting, emotional state, anxiety levels, expectation, and prior pain experience may influence pain perception and show large inter-individual differences that may also be affected by time variables.  Patient instructions provided during this appointment: Patient Instructions   ____________________________________________________________________________________________  Medication Rules  Applies to: All patients receiving prescriptions (written or electronic).  Pharmacy of record: Pharmacy where electronic prescriptions will be sent. If written prescriptions are taken to a different pharmacy, please inform the nursing staff. The pharmacy listed in the electronic medical record should be the one where you would like electronic prescriptions to be sent.  Prescription refills: Only during scheduled appointments. Applies to both, written and electronic prescriptions.  NOTE: The following applies primarily to controlled substances (Opioid* Pain Medications).   Patient's responsibilities: 1. Pain Pills: Bring all pain pills to every appointment (except for procedure appointments). 2. Pill Bottles: Bring pills in original pharmacy  bottle. Always bring newest bottle. Bring bottle, even if empty. 3. Medication refills: You are responsible for knowing and keeping track of what medications you need refilled. The day before your appointment, write a list of all prescriptions that need to be refilled. Bring that list to your appointment and give it to the admitting nurse. Prescriptions will be written only during appointments. If you forget a medication, it will not be "Called in", "Faxed", or "electronically sent". You will need to get another appointment to get these prescribed. 4. Prescription Accuracy: You are responsible for carefully inspecting your prescriptions before leaving our office. Have the discharge nurse carefully go over each prescription with you, before taking them home. Make sure that your name is accurately spelled, that your address is correct. Check the name and dose of your medication to make sure it is accurate. Check the number of pills, and the written instructions to make sure they are clear and accurate. Make sure that you are given enough medication to last until your next medication refill appointment. 5. Taking Medication: Take medication as prescribed. Never take more pills than instructed. Never take medication more frequently than prescribed. Taking less pills or less frequently is permitted and encouraged, when it comes to controlled substances (written prescriptions).  6. Inform other Doctors: Always inform, all of your healthcare providers, of all the medications you take. 7. Pain Medication from other Providers: You are not allowed to accept any additional pain medication from any other Doctor or Healthcare provider. There are two exceptions to this rule. (see below) In the event that you require additional pain medication, you are responsible for notifying us, as stated below. 8. Medication Agreement: You are responsible for carefully reading and following our Medication Agreement. This must be signed  before receiving any prescriptions from our practice. Safely store a copy of your signed Agreement. Violations to the Agreement will result in no further prescriptions. (Additional copies of our Medication Agreement are available upon request.) 9. Laws, Rules, & Regulations: All patients are expected  to follow all Federal and [employer], Rules, & Regulations. Ignorance of the Laws does not constitute a valid excuse. The use of any illegal substances is prohibited. 10. Adopted CDC guidelines & recommendations: Target dosing levels will be at or below 60 MME/day. Use of benzodiazepines** is not recommended.  Exceptions: There are only two exceptions to the rule of not receiving pain medications from other Healthcare Providers. 1. Exception #1 (Emergencies): In the event of an emergency (i.e.: accident requiring emergency care), you are allowed to receive additional pain medication. However, you are responsible for: As soon as you are able, call our office [phone], at any time of the day or night, and leave a message stating your name, the date and nature of the emergency, and the name and dose of the medication prescribed. In the event that your call is answered by a member of our staff, make sure to document and save the date, time, and the name of the person that took your information.  2. Exception #2 (Planned Surgery): In the event that you are scheduled by another doctor or dentist to have any type of surgery or procedure, you are allowed (for a period no longer than 30 days), to receive additional pain medication, for the acute post-op pain. However, in this case, you are responsible for picking up a copy of our "Post-op Pain Management for Surgeons" handout, and giving it to your surgeon or dentist. This document is available at our office, and does not require an appointment to obtain it. Simply go to our office during business hours (Monday-Thursday from 8:00 AM to 4:00 PM) (Friday 8:00  AM to 12:00 Noon) or if you have a scheduled appointment with [place], prior to your surgery, and ask for it by name. In addition, you will need to provide [place] with your name, name of your surgeon, type of surgery, and date of procedure or surgery.  *Opioid medications include: morphine, codeine, oxycodone, oxymorphone, hydrocodone, hydromorphone, meperidine, tramadol, tapentadol, buprenorphine, fentanyl, methadone. **Benzodiazepine medications include: diazepam (Valium), alprazolam (Xanax), clonazepam (Klonopine), lorazepam (Ativan), clorazepate (Tranxene), chlordiazepoxide (Librium), estazolam (Prosom), oxazepam (Serax), temazepam (Restoril), triazolam (Halcion)  ____________________________________________________________________________________________

## 2016-09-20 LAB — TOXASSURE SELECT 13 (MW), URINE

## 2016-11-09 ENCOUNTER — Ambulatory Visit: Payer: Medicare Other | Admitting: Family Medicine

## 2016-11-13 ENCOUNTER — Ambulatory Visit: Payer: Medicare Other | Attending: Nurse Practitioner | Admitting: Nurse Practitioner

## 2016-11-13 ENCOUNTER — Encounter: Payer: Self-pay | Admitting: Nurse Practitioner

## 2016-11-13 ENCOUNTER — Other Ambulatory Visit: Payer: Self-pay | Admitting: Family Medicine

## 2016-11-13 VITALS — BP 214/71 | HR 85 | Temp 97.3°F | Resp 18 | Ht 61.0 in | Wt 210.0 lb

## 2016-11-13 DIAGNOSIS — G894 Chronic pain syndrome: Secondary | ICD-10-CM

## 2016-11-13 DIAGNOSIS — E7800 Pure hypercholesterolemia, unspecified: Secondary | ICD-10-CM

## 2016-11-13 DIAGNOSIS — Z9842 Cataract extraction status, left eye: Secondary | ICD-10-CM | POA: Diagnosis not present

## 2016-11-13 DIAGNOSIS — E669 Obesity, unspecified: Secondary | ICD-10-CM | POA: Insufficient documentation

## 2016-11-13 DIAGNOSIS — Z8249 Family history of ischemic heart disease and other diseases of the circulatory system: Secondary | ICD-10-CM | POA: Insufficient documentation

## 2016-11-13 DIAGNOSIS — M1990 Unspecified osteoarthritis, unspecified site: Secondary | ICD-10-CM | POA: Insufficient documentation

## 2016-11-13 DIAGNOSIS — Z6839 Body mass index (BMI) 39.0-39.9, adult: Secondary | ICD-10-CM | POA: Insufficient documentation

## 2016-11-13 DIAGNOSIS — E785 Hyperlipidemia, unspecified: Secondary | ICD-10-CM | POA: Insufficient documentation

## 2016-11-13 DIAGNOSIS — Z9841 Cataract extraction status, right eye: Secondary | ICD-10-CM | POA: Diagnosis not present

## 2016-11-13 DIAGNOSIS — G8929 Other chronic pain: Secondary | ICD-10-CM

## 2016-11-13 DIAGNOSIS — I10 Essential (primary) hypertension: Secondary | ICD-10-CM

## 2016-11-13 DIAGNOSIS — Z90710 Acquired absence of both cervix and uterus: Secondary | ICD-10-CM | POA: Diagnosis not present

## 2016-11-13 DIAGNOSIS — Z87442 Personal history of urinary calculi: Secondary | ICD-10-CM | POA: Insufficient documentation

## 2016-11-13 DIAGNOSIS — E559 Vitamin D deficiency, unspecified: Secondary | ICD-10-CM | POA: Insufficient documentation

## 2016-11-13 DIAGNOSIS — M545 Low back pain: Secondary | ICD-10-CM | POA: Diagnosis not present

## 2016-11-13 DIAGNOSIS — Z823 Family history of stroke: Secondary | ICD-10-CM | POA: Diagnosis not present

## 2016-11-13 DIAGNOSIS — Z951 Presence of aortocoronary bypass graft: Secondary | ICD-10-CM | POA: Diagnosis not present

## 2016-11-13 DIAGNOSIS — M25561 Pain in right knee: Secondary | ICD-10-CM | POA: Diagnosis not present

## 2016-11-13 DIAGNOSIS — M810 Age-related osteoporosis without current pathological fracture: Secondary | ICD-10-CM | POA: Insufficient documentation

## 2016-11-13 DIAGNOSIS — K219 Gastro-esophageal reflux disease without esophagitis: Secondary | ICD-10-CM | POA: Insufficient documentation

## 2016-11-13 DIAGNOSIS — Z79891 Long term (current) use of opiate analgesic: Secondary | ICD-10-CM | POA: Diagnosis not present

## 2016-11-13 DIAGNOSIS — Z79899 Other long term (current) drug therapy: Secondary | ICD-10-CM | POA: Diagnosis not present

## 2016-11-13 DIAGNOSIS — H35039 Hypertensive retinopathy, unspecified eye: Secondary | ICD-10-CM | POA: Insufficient documentation

## 2016-11-13 DIAGNOSIS — N63 Unspecified lump in unspecified breast: Secondary | ICD-10-CM | POA: Diagnosis not present

## 2016-11-13 DIAGNOSIS — M109 Gout, unspecified: Secondary | ICD-10-CM | POA: Diagnosis not present

## 2016-11-13 DIAGNOSIS — E11319 Type 2 diabetes mellitus with unspecified diabetic retinopathy without macular edema: Secondary | ICD-10-CM | POA: Diagnosis not present

## 2016-11-13 DIAGNOSIS — Z7984 Long term (current) use of oral hypoglycemic drugs: Secondary | ICD-10-CM | POA: Diagnosis not present

## 2016-11-13 DIAGNOSIS — Z7983 Long term (current) use of bisphosphonates: Secondary | ICD-10-CM | POA: Insufficient documentation

## 2016-11-13 DIAGNOSIS — M79641 Pain in right hand: Secondary | ICD-10-CM | POA: Insufficient documentation

## 2016-11-13 DIAGNOSIS — M159 Polyosteoarthritis, unspecified: Secondary | ICD-10-CM | POA: Diagnosis not present

## 2016-11-13 DIAGNOSIS — M79642 Pain in left hand: Secondary | ICD-10-CM | POA: Insufficient documentation

## 2016-11-13 DIAGNOSIS — H401133 Primary open-angle glaucoma, bilateral, severe stage: Secondary | ICD-10-CM | POA: Insufficient documentation

## 2016-11-13 DIAGNOSIS — D649 Anemia, unspecified: Secondary | ICD-10-CM | POA: Insufficient documentation

## 2016-11-13 MED ORDER — OXYCODONE-ACETAMINOPHEN 7.5-325 MG PO TABS: 1.0000 | ORAL_TABLET | Freq: Four times a day (QID) | ORAL | 0 refills | Status: DC | PRN

## 2016-11-13 NOTE — Patient Instructions (Addendum)
____________________________________________________________________________________________  Medication Rules  Applies to: All patients receiving prescriptions (written or electronic).  Pharmacy of record: Pharmacy where electronic prescriptions will be sent. If written prescriptions are taken to a different pharmacy, please inform the nursing staff. The pharmacy listed in the electronic medical record should be the one where you would like electronic prescriptions to be sent.  Prescription refills: Only during scheduled appointments. Applies to both, written and electronic prescriptions.  NOTE: The following applies primarily to controlled substances (Opioid* Pain Medications).   Patient's responsibilities: 1. Pain Pills: Bring all pain pills to every appointment (except for procedure appointments). 2. Pill Bottles: Bring pills in original pharmacy bottle. Always bring newest bottle. Bring bottle, even if empty. 3. Medication refills: You are responsible for knowing and keeping track of what medications you need refilled. The day before your appointment, write a list of all prescriptions that need to be refilled. Bring that list to your appointment and give it to the admitting nurse. Prescriptions will be written only during appointments. If you forget a medication, it will not be "Called in", "Faxed", or "electronically sent". You will need to get another appointment to get these prescribed. 4. Prescription Accuracy: You are responsible for carefully inspecting your prescriptions before leaving our office. Have the discharge nurse carefully go over each prescription with you, before taking them home. Make sure that your name is accurately spelled, that your address is correct. Check the name and dose of your medication to make sure it is accurate. Check the number of pills, and the written instructions to make sure they are clear and accurate. Make sure that you are given enough medication to  last until your next medication refill appointment. 5. Taking Medication: Take medication as prescribed. Never take more pills than instructed. Never take medication more frequently than prescribed. Taking less pills or less frequently is permitted and encouraged, when it comes to controlled substances (written prescriptions).  6. Inform other Doctors: Always inform, all of your healthcare providers, of all the medications you take. 7. Pain Medication from other Providers: You are not allowed to accept any additional pain medication from any other Doctor or Healthcare provider. There are two exceptions to this rule. (see below) In the event that you require additional pain medication, you are responsible for notifying us, as stated below. 8. Medication Agreement: You are responsible for carefully reading and following our Medication Agreement. This must be signed before receiving any prescriptions from our practice. Safely store a copy of your signed Agreement. Violations to the Agreement will result in no further prescriptions. (Additional copies of our Medication Agreement are available upon request.) 9. Laws, Rules, & Regulations: All patients are expected to follow all Federal and State Laws, Statutes, Rules, & Regulations. Ignorance of the Laws does not constitute a valid excuse. The use of any illegal substances is prohibited. 10. Adopted CDC guidelines & recommendations: Target dosing levels will be at or below 60 MME/day. Use of benzodiazepines** is not recommended.  Exceptions: There are only two exceptions to the rule of not receiving pain medications from other Healthcare Providers. 1. Exception #1 (Emergencies): In the event of an emergency (i.e.: accident requiring emergency care), you are allowed to receive additional pain medication. However, you are responsible for: As soon as you are able, call our office [phone], at any time of the day or night, and leave a message stating your  name, the date and nature of the emergency, and the name and dose of the medication   prescribed. In the event that your call is answered by a member of our staff, make sure to document and save the date, time, and the name of the person that took your information.  2. Exception #2 (Planned Surgery): In the event that you are scheduled by another doctor or dentist to have any type of surgery or procedure, you are allowed (for a period no longer than 30 days), to receive additional pain medication, for the acute post-op pain. However, in this case, you are responsible for picking up a copy of our "Post-op Pain Management for Surgeons" handout, and giving it to your surgeon or dentist. This document is available at our office, and does not require an appointment to obtain it. Simply go to our office during business hours (Monday-Thursday from 8:00 AM to 4:00 PM) (Friday 8:00 AM to 12:00 Noon) or if you have a scheduled appointment with [place], prior to your surgery, and ask for it by name. In addition, you will need to provide [place] with your name, name of your surgeon, type of surgery, and date of procedure or surgery.  *Opioid medications include: morphine, codeine, oxycodone, oxymorphone, hydrocodone, hydromorphone, meperidine, tramadol, tapentadol, buprenorphine, fentanyl, methadone. **Benzodiazepine medications include: diazepam (Valium), alprazolam (Xanax), clonazepam (Klonopine), lorazepam (Ativan), clorazepate (Tranxene), chlordiazepoxide (Librium), estazolam (Prosom), oxazepam (Serax), temazepam (Restoril), triazolam (Halcion)  ____________________________________________________________________________________________ We will need MAR in the future that shows how much of her medication she is actually taking.   BMI Assessment: Estimated body mass index is 39.68 kg/m as calculated from the following:   Height as of this encounter: 5\' 1"  (1.549 m).   Weight as of this encounter: 210 lb (95.3 kg).  BMI  interpretation table: BMI level Category Range association with higher incidence of chronic pain  <18 kg/m2 Underweight   18.5-24.9 kg/m2 Ideal body weight   25-29.9 kg/m2 Overweight Increased incidence by 20%  30-34.9 kg/m2 Obese (Class I) Increased incidence by 68%  35-39.9 kg/m2 Severe obesity (Class II) Increased incidence by 136%  >40 kg/m2 Extreme obesity (Class III) Increased incidence by 254%   BMI Readings from Last 4 Encounters:  11/13/16 39.68 kg/m  09/13/16 39.30 kg/m  08/14/16 39.34 kg/m  07/19/16 39.68 kg/m   Wt Readings from Last 4 Encounters:  11/13/16 210 lb (95.3 kg)  09/13/16 208 lb (94.3 kg)  08/14/16 208 lb 3.2 oz (94.4 kg)  07/19/16 210 lb (95.3 kg)

## 2016-11-13 NOTE — Progress Notes (Signed)
Nursing Pain Medication Assessment:  Safety precautions to be maintained throughout the outpatient stay will include: orient to surroundings, keep bed in low position, maintain call bell within reach at all times, provide assistance with transfer out of bed and ambulation.  Medication Inspection Compliance: Ms. Edell did not comply with our request to bring her pills to be counted. She was reminded that bringing the medication bottles, even when empty, is a requirement.  Medication: None brought in. Pill/Patch Count: None available to be counted. Bottle Appearance: No container available. Did not bring bottle(s) to appointment. Filled Date: N/A Last Medication intake:  Today   Patient lives in a health care facility. They keep her meds and given them to her.

## 2016-11-13 NOTE — Telephone Encounter (Signed)
Patient requesting refill of Pravastatin, Quinapril and Ranitidine to [facility].

## 2016-11-13 NOTE — Progress Notes (Signed)
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 address is correct. Check the name and dose of your medication to make sure it is accurate. Check the number of pills, and the written instructions to make sure they are clear and accurate. Make sure that you are given enough medication to last until your next medication refill appointment. 5. Taking Medication: Take medication as prescribed. Never take more pills than instructed. Never take medication more frequently than prescribed. Taking less pills or less frequently is permitted and encouraged, when it comes to controlled substances (written prescriptions).  6. Inform other Doctors: Always inform, all of your healthcare providers, of all the medications you take. 7. Pain Medication from other Providers: You are not allowed to accept any additional pain medication from any other Doctor or Healthcare provider. There are two exceptions to this rule. (see below)  In the event that you require additional pain medication, you are responsible for notifying us, as stated below. 8. Medication Agreement: You are responsible for carefully reading and following our Medication Agreement. This must be signed before receiving any prescriptions from our practice. Safely store a copy of your signed Agreement. Violations to the Agreement will result in no further prescriptions. (Additional copies of our Medication Agreement are available upon request.) 9. Laws, Rules, & Regulations: All patients are expected to follow all Federal and [employer], [employer], Rules, [employer]. Ignorance of the Laws does not constitute a valid excuse. The use of any illegal substances is prohibited. 10. Adopted CDC guidelines & recommendations: Target dosing levels will be at or below 60 MME/day. Use of benzodiazepines** is not recommended.  Exceptions: There are only two exceptions to the rule of not receiving pain medications from other Healthcare Providers. 1. Exception #1 (Emergencies): In the event of an emergency (i.e.: accident requiring emergency care), you are allowed to receive additional pain medication. However, you are responsible for: As soon as you are able, call our office [phone], at any time of the day or night, and leave a message stating your name, the date and nature of the emergency, and the name and dose of the medication prescribed. In the event that your call is answered by a member of our staff, make sure to document and save the date, time, and the name of the person that took your information.  2. Exception #2 (Planned Surgery): In the event that you are scheduled by another doctor or dentist to have any type of surgery or procedure, you are allowed (for a period no longer than 30 days), to receive additional pain medication, for the acute post-op pain. However, in this case, you are responsible for picking up a copy of our "Post-op Pain Management for Surgeons"  handout, and giving it to your surgeon or dentist. This document is available at our office, and does not require an appointment to obtain it. Simply go to our office during business hours (Monday-Thursday from 8:00 AM to 4:00 PM) (Friday 8:00 AM to 12:00 Noon) or if you have a scheduled appointment with [place], prior to your surgery, and ask for it by name. In addition, you will need to provide [place] with your name, name of your surgeon, type of surgery, and date of procedure or surgery.  *Opioid medications include: morphine, codeine, oxycodone, oxymorphone, hydrocodone, hydromorphone, meperidine, tramadol, tapentadol, buprenorphine, fentanyl, methadone. **Benzodiazepine medications include: diazepam (Valium), alprazolam (Xanax), clonazepam (Klonopine), lorazepam (Ativan), clorazepate (Tranxene), chlordiazepoxide (Librium), estazolam (Prosom), oxazepam (Serax), temazepam (Restoril), triazolam (Halcion)  ____________________________________________________________________________________________ We  will need MAR in the future that shows how much of her medication she is actually taking.   BMI Assessment: Estimated body mass index is 39.68 kg/m as calculated from the following:   Height as of this encounter: 5' 1" (1.549 m).   Weight as of this encounter: 210 lb (95.3 kg).  BMI interpretation table: BMI level Category Range association with higher incidence of chronic pain  <18 kg/m2 Underweight   18.5-24.9 kg/m2 Ideal body weight   25-29.9 kg/m2 Overweight Increased incidence by 20%  30-34.9 kg/m2 Obese (Class I) Increased incidence by 68%  35-39.9 kg/m2 Severe obesity (Class II) Increased incidence by 136%  >40 kg/m2 Extreme obesity (Class III) Increased incidence by 254%   BMI Readings from Last 4 Encounters:  11/13/16 39.68 kg/m  09/13/16 39.30 kg/m  08/14/16 39.34 kg/m  07/19/16 39.68 kg/m   Wt Readings from Last 4 Encounters:  11/13/16 210 lb (95.3 kg)  09/13/16 208 lb (94.3 kg)   08/14/16 208 lb 3.2 oz (94.4 kg)  07/19/16 210 lb (95.3 kg)

## 2016-11-23 ENCOUNTER — Ambulatory Visit (INDEPENDENT_AMBULATORY_CARE_PROVIDER_SITE_OTHER): Payer: Medicare Other | Admitting: Family Medicine

## 2016-11-23 ENCOUNTER — Encounter: Payer: Self-pay | Admitting: Family Medicine

## 2016-11-23 ENCOUNTER — Telehealth: Payer: Self-pay

## 2016-11-23 VITALS — BP 140/60 | HR 80 | Temp 98.0°F | Resp 16 | Ht 61.0 in | Wt 219.2 lb

## 2016-11-23 DIAGNOSIS — I700 Atherosclerosis of aorta: Secondary | ICD-10-CM | POA: Diagnosis not present

## 2016-11-23 DIAGNOSIS — M810 Age-related osteoporosis without current pathological fracture: Secondary | ICD-10-CM | POA: Diagnosis not present

## 2016-11-23 DIAGNOSIS — Z9181 History of falling: Secondary | ICD-10-CM | POA: Diagnosis not present

## 2016-11-23 DIAGNOSIS — M4310 Spondylolisthesis, site unspecified: Secondary | ICD-10-CM | POA: Diagnosis not present

## 2016-11-23 DIAGNOSIS — S060X0D Concussion without loss of consciousness, subsequent encounter: Secondary | ICD-10-CM

## 2016-11-23 DIAGNOSIS — K219 Gastro-esophageal reflux disease without esophagitis: Secondary | ICD-10-CM | POA: Diagnosis not present

## 2016-11-23 DIAGNOSIS — Z23 Encounter for immunization: Secondary | ICD-10-CM

## 2016-11-23 MED ORDER — VITAMIN D (ERGOCALCIFEROL) 1.25 MG (50000 UNIT) PO CAPS: 50000.0000 [IU] | ORAL_CAPSULE | ORAL | 0 refills | Status: DC

## 2016-11-23 MED ORDER — RANITIDINE HCL 150 MG PO TABS: ORAL_TABLET | ORAL | 1 refills | Status: DC

## 2016-11-23 MED ORDER — ALENDRONATE SODIUM 70 MG PO TABS: ORAL_TABLET | ORAL | 1 refills | Status: DC

## 2016-11-23 NOTE — Progress Notes (Signed)
Name: Taylor [name]   MRN: [id number]    DOB: [birth year]   Date:11/23/2016       Progress Note  Subjective  Chief Complaint  Chief Complaint  Patient presents with  . Hospitalization Follow-up  . Fall    Patient was making a quick move to get out of the way of a car and went backward. She did not fracure anything, but hit of back of her head. Went to Rehab on the 6th of September until 3rd of October.     HPI  Hospital follow up: she went groceries shopping at [employer] in [place] on September 3rd, 2018 and while pushing the grocerie cart a vehicle started to back up on her, she tried to move quickly lost her balance and fell backwards. She recalls all the events prior and during the fall. She was able to get up with assistance and 911 was called, pain on mid back was very intense and she was transported to [facility] because she had a history of back surgery ( previously done at [facility]), she stayed in house for 3 days, had x-ray and pan management. She was transferred to [facility] care upon discharge. She was given a walker and also had PT and OT, pain level is better controlled , down to 3-10 localized on lumbar spine, no radiculitis. She has not been driving and her friend Mrs. Sharlett Iles , had to bring her to the office today. CT abdomen showed atherosclerosis she is already of bp medication and statin therapy, she was advised to consider resuming aspirin 81 mg   Since the fall she has noticed intermittent dizziness and headaches and has difficulty balancing. Explained she likes has post-concussion symptoms and also because of age it takes longer to get gait back to baseline   Patient Active Problem List   Diagnosis Date Noted  . Chronic pain syndrome 09/13/2016  . Vitamin D deficiency 08/19/2016  . Anemia, unspecified 08/19/2016  . Primary open angle glaucoma (POAG) of both eyes, severe stage 08/17/2016  . Hypertensive retinopathy 08/17/2016  . Long term current use of opiate  analgesic 07/19/2016  . Dyslipidemia associated with type 2 diabetes mellitus ([place]) 04/16/2016  . Chronic midline low back pain without sciatica 04/16/2016  . Chronic pain of right knee 04/16/2016  . Vaginal dryness 02/22/2015  . Calculus of kidney 11/10/2014  . Osteoarthritis, multiple sites 09/09/2014  . Type 2 diabetes mellitus with microalbuminuria ([place]) 09/09/2014  . Glaucoma 09/09/2014  . BP (high blood pressure) 09/09/2014  . Chronic pain 09/09/2014  . Tinea corporis 09/09/2014  . Umbilical hernia 40/98/1191  . Lump or mass in breast     Past Surgical History:  Procedure Laterality Date  . ABDOMINAL HYSTERECTOMY  1958   menorrhagia  . BACK SURGERY    . CATARACT EXTRACTION, BILATERAL Bilateral    1st 06/07/15 and the 2nd 06/21/15  . CHOLECYSTECTOMY    . COLONOSCOPY  2003   [facility]  . CORONARY ARTERY BYPASS GRAFT Left 01/03/2012,01/24/2012   Left FNA, Left breast [facility] bx  . KNEE SURGERY  2009,2011,2012   twice on right and once on left  . KNEE SURGERY    . LIPOMA EXCISION  1998  . [place]  2004  . TONSILLECTOMY     age of 69    Family History  Problem Relation Age of Onset  . Cancer Mother        skin cancer  . Stroke Mother   . Heart disease  Father   . Breast cancer Neg Hx     Social History   Social History  . Marital status: Divorced    Spouse name: N/A  . Number of children: 4  . Years of education: N/A   Occupational History  . retired     Lawyer - used to wire units   Social History Main Topics  . Smoking status: Never Smoker  . Smokeless tobacco: Never Used  . Alcohol use No  . Drug use: No  . Sexual activity: No   Other Topics Concern  . Not on file   Social History Narrative   Lives alone   Children do not live in town   Friend helps out: Cira Rue - [phone]   Independent prior to her fall 10/2016     Current Outpatient Prescriptions:  .  acetaminophen (TYLENOL) 500 MG tablet, Take 1 tablet (500 mg  total) by mouth 3 (three) times daily. (Patient taking differently: Take 500 mg by mouth as needed. ), Disp: 90 tablet, Rfl: 0 .  alendronate (FOSAMAX) 70 MG tablet, TAKE 1 TABLET WEEKLY, Disp: 12 tablet, Rfl: 1 .  amLODipine (NORVASC) 10 MG tablet, Take 1 tablet (10 mg total) by mouth daily., Disp: 90 tablet, Rfl: 1 .  BESIVANCE 0.6 % SUSP, 1 DROP IN SURGICAL EYE TWICE A DAY STARTING 1 DAY BEFORE SURGERY, Disp: , Rfl: 1 .  bimatoprost (LUMIGAN) 0.01 % SOLN, Place 1 drop into both eyes daily. , Disp: , Rfl:  .  brimonidine-timolol (COMBIGAN) 0.2-0.5 % ophthalmic solution, Place 1 drop into both eyes See admin instructions. 1 drop into both eyes in the morning, then 1 drop into both eyes 8 hours later, Disp: , Rfl:  .  Bromfenac Sodium 0.09 % SOLN, APPLY 1 DROP INTO AFFECTED EYE DAILY, Disp: , Rfl: 1 .  Cholecalciferol (VITAMIN D-1000 MAX ST) 1000 UNITS tablet, Take 1,000 Units by mouth daily. Reported on 08/05/2015, Disp: , Rfl:  .  conjugated estrogens (PREMARIN) vaginal cream, Place 1 Applicatorful vaginally daily., Disp: 42.5 g, Rfl: 5 .  DUREZOL 0.05 % EMUL, Place 1 drop into both eyes 4 (four) times daily. , Disp: , Rfl:  .  fluticasone (FLONASE) 50 MCG/ACT nasal spray, Place 2 sprays into both nostrils daily., Disp: 16 g, Rfl: 1 .  glucose blood test strip, Use as instructed, Disp: 100 each, Rfl: 12 .  ketoconazole (NIZORAL) 2 % cream, Apply 1 application topically daily., Disp: 15 g, Rfl: 0 .  ketorolac (ACULAR) 0.5 % ophthalmic solution, , Disp: , Rfl:  .  lidocaine (XYLOCAINE) 5 % ointment, Apply 1 application topically 3 (three) times daily as needed. 2 grams, Disp: 35.44 g, Rfl: 0 .  metFORMIN (GLUCOPHAGE) 500 MG tablet, Take 1 tablet (500 mg total) by mouth 2 (two) times daily with a meal., Disp: 180 tablet, Rfl: 1 .  omega-3 acid ethyl esters (LOVAZA) 1 g capsule, Take 1 capsule (1 g total) by mouth 2 (two) times daily., Disp: 180 capsule, Rfl: 1 .  ONETOUCH DELICA LANCETS FINE MISC, 1  each by Does not apply route daily. 30 G Lancets- One Touch Delica, Disp: 295 each, Rfl: 1 .  [START ON 01/07/2017] oxyCODONE-acetaminophen (PERCOCET) 7.5-325 MG tablet, Take 1 tablet by mouth every 6 (six) hours as needed for moderate pain or severe pain., Disp: 90 tablet, Rfl: 0 .  pravastatin (PRAVACHOL) 40 MG tablet, TAKE 1 TABLET (40 MG TOTAL) BY MOUTH AT BEDTIME., Disp: 90 tablet, Rfl: 1 .  quinapril (ACCUPRIL) 40 MG tablet, TAKE 1 TABLET (40 MG TOTAL) BY MOUTH DAILY., Disp: 90 tablet, Rfl: 1 .  ranitidine (ZANTAC) 150 MG tablet, TAKE 1 TABLET BY MOUTH DAILY AS NEEDED FOR HEARTBURN, *TAKE THIS IN PLACCE OF OMEPRAZOLE*, Disp: 90 tablet, Rfl: 1 .  Vitamin D, Ergocalciferol, (DRISDOL) 50000 units CAPS capsule, Take 1 capsule (50,000 Units total) by mouth every 7 (seven) days., Disp: 12 capsule, Rfl: 0  No Known Allergies   ROS  Constitutional: Negative for fever or weight change.  Respiratory: positive for occasional cough but no shortness of breath.   Cardiovascular: Negative for chest pain or palpitations.  Gastrointestinal: Negative for abdominal pain, no bowel changes.  Musculoskeletal: Positive  for gait problem and  joint swelling both ankles.  Skin: Negative for rash.  Neurological: positive  for intermittent dizziness and intermittent  headache.  No other specific complaints in a complete review of systems (except as listed in HPI above).  Objective  Vitals:   11/23/16 1006  BP: 140/60  Pulse: 80  Resp: 16  Temp: 98 F (36.7 C)  TempSrc: Oral  SpO2: 97%  Weight: 219 lb 3.2 oz (99.4 kg)  Height: 5\' 1"  (1.549 m)    Body mass index is 41.42 kg/m.  Physical Exam  Constitutional: Patient appears well-developed and well-nourished. Obese  No distress.  HEENT: head atraumatic, normocephalic, pupils equal and reactive to light,  neck supple, throat within normal limits Cardiovascular: Normal rate, regular rhythm and normal heart sounds.  No murmur heard. No BLE  edema. Pulmonary/Chest: Effort normal and breath sounds normal. No respiratory distress. Abdominal: Soft.  There is no tenderness. Muscular Skeletal: slowly walking, using walker.  Psychiatric: Patient has a normal mood and affect. behavior is normal. Judgment and thought content normal.  Recent Results (from the past 2160 hour(s))  ToxASSURE Select 13 (MW), Urine     Status: None   Collection Time: 09/13/16  2:46 PM  Result Value Ref Range   Summary FINAL     Comment: ==================================================================== TOXASSURE SELECT 13 (MW) ==================================================================== Test                             Result       Flag       Units Drug Present and Declared for Prescription Verification   Oxycodone                      175          EXPECTED   ng/mg creat   Oxymorphone                    591          EXPECTED   ng/mg creat   Noroxycodone                   644          EXPECTED   ng/mg creat   Noroxymorphone                 234          EXPECTED   ng/mg creat    Sources of oxycodone are scheduled prescription medications.    Oxymorphone, noroxycodone, and noroxymorphone are expected    metabolites of oxycodone. Oxymorphone is also available as a    scheduled prescription medication. ==================================================================== Test  Result    Flag   Units      Ref Range   Creatinine              125              mg/dL      >=20 ====== ============================================================== Declared Medications:  The flagging and interpretation on this report are based on the  following declared medications.  Unexpected results may arise from  inaccuracies in the declared medications.  **Note: The testing scope of this panel includes these medications:  Oxycodone (Percocet)  **Note: The testing scope of this panel does not include following  reported medications:   Acetaminophen (Percocet)  Acetaminophen (Tylenol)  Alendronate (Fosamax)  Amlodipine (Norvasc)  Bimatoprost (Lumigan)  Cholecalciferol  Cyclobenzaprine (Flexeril)  Estrogen (Premarin)  Fluticasone (Flonase)  Ketoconazole (Nizoral)  Lidocaine (Xylocaine)  Metformin  Omega-3 Fatty Acids (Lovaza)  Pravastatin (Pravachol)  Quinapril (Accupril)  Ranitidine (Zantac)  Vitamin D2 (Drisdol) ==================================================================== For clinical consultation, please call [phone]. =================== =================================================      PHQ2/9: Depression screen [facility] 2/9 11/23/2016 11/13/2016 09/13/2016 08/14/2016 07/19/2016  Decreased Interest 0 0 0 0 0  Down, Depressed, Hopeless 0 0 0 0 0  PHQ - 2 Score 0 0 0 0 0     Fall Risk: Fall Risk  11/23/2016 11/13/2016 09/13/2016 08/14/2016 07/19/2016  Falls in the past year? Yes Yes No No No  Comment - - - - -  Number falls in past yr: 1 1 - - -  Injury with Fall? Yes Yes - - -  Risk Factor Category  - High Fall Risk - - -    Functional Status Survey: Is the patient deaf or have difficulty hearing?: Yes Does the patient have difficulty seeing, even when wearing glasses/contacts?: Yes Does the patient have difficulty concentrating, remembering, or making decisions?: Yes Does the patient have difficulty walking or climbing stairs?: Yes Does the patient have difficulty dressing or bathing?: Yes Does the patient have difficulty doing errands alone such as visiting a doctor's office or shopping?: Yes   Assessment & Plan  1. History of recent fall  - Ambulatory referral to Home Health  2. Needs flu shot  - Flu vaccine HIGH DOSE PF (Fluzone High dose)  3. Concussion without loss of consciousness, subsequent encounter  - Ambulatory referral to [place]  4. Age-related osteoporosis without current pathological fracture  - alendronate (FOSAMAX) 70 MG tablet; TAKE 1 TABLET WEEKLY   Dispense: 12 tablet; Refill: 1 - Vitamin D, Ergocalciferol, (DRISDOL) 50000 units CAPS capsule; Take 1 capsule (50,000 Units total) by mouth every 7 (seven) days.  Dispense: 12 capsule; Refill: 0  5. Gastroesophageal reflux disease without esophagitis  - ranitidine (ZANTAC) 150 MG tablet; TAKE 1 TABLET BY MOUTH DAILY AS NEEDED FOR HEARTBURN, *TAKE THIS IN PLACCE OF OMEPRAZOLE*  Dispense: 90 tablet; Refill: 1   6. Aortic atherosclerosis ([place])  She is on bp medication and statin therapy and she is high risk for falls, so she is not sure if she will resume aspirin  7. Anterolisthesis  Stable

## 2016-11-23 NOTE — Telephone Encounter (Signed)
Spoke with [facility] and they stated they put the referral into [facility] for PT and OT. Taylor [name] will be handling patient's care and her phone number is [phone]. Spoke with [name] and she states she never received the resumption care orders and will call [place] to straighten this out and contact us back.

## 2016-11-23 NOTE — Telephone Encounter (Signed)
I entered referral just now, please fax to them.

## 2016-11-26 ENCOUNTER — Telehealth: Payer: Self-pay | Admitting: Family Medicine

## 2016-11-26 NOTE — Telephone Encounter (Signed)
Maria from [place] at [facility] notifying you that pt has been seen this past Saturday for home health but they are request Verbal order home health physical therapy for 1x week for 1 week and 2x weekly for 6 weeks. [phone]

## 2016-11-26 NOTE — Telephone Encounter (Signed)
Okay to give verbal order, please ask the diagnosis for documentation

## 2016-11-27 NOTE — Telephone Encounter (Signed)
Verdis Frederickson from [facility] was notified of verbal order for Home Health PT.

## 2016-11-28 NOTE — Telephone Encounter (Signed)
Also received a call from [place] with [facility] stating they are also in need of a verbal order for occupational therapy  2 times per week for 6 weeks.  Connie's call back # is [phone]

## 2016-11-28 NOTE — Telephone Encounter (Signed)
Taylor [name] from [facility] was notified of verbal order from Dr. Ancil Boozer regarding occupational therapy for 2x weekly for 6 weeks.

## 2016-11-28 NOTE — Telephone Encounter (Signed)
Okay 

## 2016-11-30 ENCOUNTER — Other Ambulatory Visit: Payer: Self-pay | Admitting: Family Medicine

## 2016-11-30 MED ORDER — CYCLOBENZAPRINE HCL 5 MG PO TABS: 5.0000 mg | ORAL_TABLET | Freq: Three times a day (TID) | ORAL | 0 refills | Status: DC

## 2016-11-30 NOTE — Telephone Encounter (Signed)
Patient would like a rx for muscle relaxer for her hip and side pain.  Patient was last seen on 11/23/2016 and she stated that Dr. Ancil Boozer offered to give her a rx for pain on that visit but patient declined at that time.  She states that she is now needing it.

## 2016-11-30 NOTE — Telephone Encounter (Signed)
Refill request for general medication. Muscle relaxer  Last office visit: 11/23/2016  No Follow-up on file.  Patient has taken flexeril in the past for muscle relaxants.  Teed up flexeril from history. NKDA

## 2016-12-14 ENCOUNTER — Ambulatory Visit: Payer: Medicare Other | Admitting: Family Medicine

## 2016-12-18 ENCOUNTER — Ambulatory Visit (INDEPENDENT_AMBULATORY_CARE_PROVIDER_SITE_OTHER): Payer: Medicare Other | Admitting: Family Medicine

## 2016-12-18 ENCOUNTER — Encounter: Payer: Self-pay | Admitting: Family Medicine

## 2016-12-18 VITALS — BP 130/60 | HR 86 | Resp 14 | Ht 61.0 in | Wt 219.0 lb

## 2016-12-18 DIAGNOSIS — R809 Proteinuria, unspecified: Secondary | ICD-10-CM

## 2016-12-18 DIAGNOSIS — E785 Hyperlipidemia, unspecified: Secondary | ICD-10-CM

## 2016-12-18 DIAGNOSIS — D649 Anemia, unspecified: Secondary | ICD-10-CM

## 2016-12-18 DIAGNOSIS — E1169 Type 2 diabetes mellitus with other specified complication: Secondary | ICD-10-CM

## 2016-12-18 DIAGNOSIS — I10 Essential (primary) hypertension: Secondary | ICD-10-CM | POA: Diagnosis not present

## 2016-12-18 DIAGNOSIS — M545 Low back pain, unspecified: Secondary | ICD-10-CM

## 2016-12-18 DIAGNOSIS — K219 Gastro-esophageal reflux disease without esophagitis: Secondary | ICD-10-CM | POA: Diagnosis not present

## 2016-12-18 DIAGNOSIS — G8929 Other chronic pain: Secondary | ICD-10-CM

## 2016-12-18 DIAGNOSIS — E1129 Type 2 diabetes mellitus with other diabetic kidney complication: Secondary | ICD-10-CM | POA: Diagnosis not present

## 2016-12-18 DIAGNOSIS — I700 Atherosclerosis of aorta: Secondary | ICD-10-CM

## 2016-12-18 LAB — POCT GLYCOSYLATED HEMOGLOBIN (HGB A1C): Hemoglobin A1C: 7.1

## 2016-12-18 NOTE — Progress Notes (Signed)
Name: Taylor [name]   MRN: [id number]    DOB: 01/21/1933   Date:12/18/2016       Progress Note  Subjective  Chief Complaint  Chief Complaint  Patient presents with  . Diabetes    HPI  HTN: bp is at goal today, she denies side effects of medication. She denies chest pain or SOB. She denies orthopnea or lower extremity edema  Hyperlipidemia: she is taking pravastatin and Omega 3 fatty acids,  no decrease in exercise tolerance or SOB, no myalgias . She has intermittent hand cramps.   DM: glucose 130's fasting  highest 150 in am's. She denies polyphagia, polydipsia or polyuria. Eye exam is up to date foot exam done by Dr. Garry Heater has microalbuminuria and is taking ACE. She also has dyslipidemia with low HDL, but LDL is at goal on statin therapy, currently also on Lovaza. We will check urine micro today   GERD: she is off Omeprazole, she is now taking Ranitidine prn and is doing well, no heartburn or regurgitation at this time.  OA: hands/ right knee /chronic back pain: explained the risk of narcotics,she goes to pain clinic, Dr. Andree Elk. Discussed referral to Ortho but she states she does not want to have another surgery. She has difficulty walking since fall 10/2016 - went to rehab and is now having home PT still using a walker and is afraid of falling because she still feels weak.   Patient Active Problem List   Diagnosis Date Noted  . Aortic atherosclerosis ([place]) 11/23/2016  . Anterolisthesis 11/23/2016  . Chronic pain syndrome 09/13/2016  . Vitamin D deficiency 08/19/2016  . Anemia, unspecified 08/19/2016  . Primary open angle glaucoma (POAG) of both eyes, severe stage 08/17/2016  . Hypertensive retinopathy 08/17/2016  . Long term current use of opiate analgesic 07/19/2016  . Dyslipidemia associated with type 2 diabetes mellitus ([place]) 04/16/2016  . Chronic midline low back pain without sciatica 04/16/2016  . Chronic pain of right knee 04/16/2016  . Vaginal dryness  02/22/2015  . Calculus of kidney 11/10/2014  . Osteoarthritis, multiple sites 09/09/2014  . Type 2 diabetes mellitus with microalbuminuria ([place]) 09/09/2014  . Glaucoma 09/09/2014  . BP (high blood pressure) 09/09/2014  . Chronic pain 09/09/2014  . Tinea corporis 09/09/2014  . Umbilical hernia 38/18/2993  . Lump or mass in breast     Past Surgical History:  Procedure Laterality Date  . ABDOMINAL HYSTERECTOMY  1958   menorrhagia  . BACK SURGERY    . CATARACT EXTRACTION, BILATERAL Bilateral    1st 06/07/15 and the 2nd 06/21/15  . CHOLECYSTECTOMY    . COLONOSCOPY  2003   [facility]  . CORONARY ARTERY BYPASS GRAFT Left 01/03/2012,01/24/2012   Left FNA, Left breast En[facility] bx  . KNEE SURGERY  2009,2011,2012   twice on right and once on left  . KNEE SURGERY    . LIPOMA EXCISION  1998  . [place]  2004  . TONSILLECTOMY     age of 38    Family History  Problem Relation Age of Onset  . Cancer Mother        skin cancer  . Stroke Mother   . Heart disease Father   . Breast cancer Neg Hx     Social History   Social History  . Marital status: Divorced    Spouse name: N/A  . Number of children: 4  . Years of education: N/A   Occupational History  . 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 Smoking status: Never Smoker  . Smokeless tobacco: Never Used  . Alcohol use No  . Drug use: No  . Sexual activity: No   Other Topics Concern  . Not on file   Social History Narrative   Lives alone   Children do not live in town   Friend helps out: Cira Rue - [phone]   Independent prior to her fall 10/2016     Current Outpatient Prescriptions:  .  acetaminophen (TYLENOL) 500 MG tablet, Take 1 tablet (500 mg total) by mouth 3 (three) times daily. (Patient taking differently: Take 500 mg by mouth as needed. ), Disp: 90 tablet, Rfl: 0 .  alendronate (FOSAMAX) 70 MG tablet, TAKE 1 TABLET WEEKLY, Disp: 12 tablet, Rfl: 1 .   amLODipine (NORVASC) 10 MG tablet, Take 1 tablet (10 mg total) by mouth daily., Disp: 90 tablet, Rfl: 1 .  BESIVANCE 0.6 % SUSP, 1 DROP IN SURGICAL EYE TWICE A DAY STARTING 1 DAY BEFORE SURGERY, Disp: , Rfl: 1 .  bimatoprost (LUMIGAN) 0.01 % SOLN, Place 1 drop into both eyes daily. , Disp: , Rfl:  .  brimonidine-timolol (COMBIGAN) 0.2-0.5 % ophthalmic solution, Place 1 drop into both eyes See admin instructions. 1 drop into both eyes in the morning, then 1 drop into both eyes 8 hours later, Disp: , Rfl:  .  Bromfenac Sodium 0.09 % SOLN, APPLY 1 DROP INTO AFFECTED EYE DAILY, Disp: , Rfl: 1 .  Cholecalciferol (VITAMIN D-1000 MAX ST) 1000 UNITS tablet, Take 1,000 Units by mouth daily. Reported on 08/05/2015, Disp: , Rfl:  .  conjugated estrogens (PREMARIN) vaginal cream, Place 1 Applicatorful vaginally daily., Disp: 42.5 g, Rfl: 5 .  cyclobenzaprine (FLEXERIL) 5 MG tablet, Take 1 tablet (5 mg total) by mouth 3 (three) times daily., Disp: 30 tablet, Rfl: 0 .  DUREZOL 0.05 % EMUL, Place 1 drop into both eyes 4 (four) times daily. , Disp: , Rfl:  .  fluticasone (FLONASE) 50 MCG/ACT nasal spray, Place 2 sprays into both nostrils daily., Disp: 16 g, Rfl: 1 .  glucose blood test strip, Use as instructed, Disp: 100 each, Rfl: 12 .  ketoconazole (NIZORAL) 2 % cream, Apply 1 application topically daily., Disp: 15 g, Rfl: 0 .  ketorolac (ACULAR) 0.5 % ophthalmic solution, , Disp: , Rfl:  .  lidocaine (XYLOCAINE) 5 % ointment, Apply 1 application topically 3 (three) times daily as needed. 2 grams, Disp: 35.44 g, Rfl: 0 .  metFORMIN (GLUCOPHAGE) 500 MG tablet, Take 1 tablet (500 mg total) by mouth 2 (two) times daily with a meal., Disp: 180 tablet, Rfl: 1 .  omega-3 acid ethyl esters (LOVAZA) 1 g capsule, Take 1 capsule (1 g total) by mouth 2 (two) times daily., Disp: 180 capsule, Rfl: 1 .  ONETOUCH DELICA LANCETS FINE MISC, 1 each by Does not apply route daily. 30 G Lancets- One Touch Delica, Disp: 353 each, Rfl:  1 .  [START ON 01/07/2017] oxyCODONE-acetaminophen (PERCOCET) 7.5-325 MG tablet, Take 1 tablet by mouth every 6 (six) hours as needed for moderate pain or severe pain., Disp: 90 tablet, Rfl: 0 .  pravastatin (PRAVACHOL) 40 MG tablet, TAKE 1 TABLET (40 MG TOTAL) BY MOUTH AT BEDTIME., Disp: 90 tablet, Rfl: 1 .  quinapril (ACCUPRIL) 40 MG tablet, TAKE 1 TABLET (40 MG TOTAL) BY MOUTH DAILY., Disp: 90 tablet, Rfl: 1 .  ranitidine (ZANTAC) 150 MG tablet, TAKE  1 TABLET BY MOUTH DAILY AS NEEDED FOR HEARTBURN, *TAKE THIS IN PLACCE OF OMEPRAZOLE*, Disp: 90 tablet, Rfl: 1 .  Vitamin D, Ergocalciferol, (DRISDOL) 50000 units CAPS capsule, Take 1 capsule (50,000 Units total) by mouth every 7 (seven) days., Disp: 12 capsule, Rfl: 0  No Known Allergies   ROS  Constitutional: Negative for fever or weight change.  Respiratory: Negative for cough and shortness of breath.   Cardiovascular: Negative for chest pain or palpitations.  Gastrointestinal: Negative for abdominal pain, no bowel changes.  Musculoskeletal: Positive  for gait problem and right knee  joint swelling.  Skin: Negative for rash.  Neurological: Negative for dizziness or headache.  No other specific complaints in a complete review of systems (except as listed in HPI above).  Objective  Vitals:   12/18/16 1126  BP: 130/60  Pulse: 86  Resp: 14  SpO2: 98%  Weight: 219 lb (99.3 kg)  Height: 5\' 1"  (1.549 m)    Body mass index is 41.38 kg/m.  Physical Exam  Constitutional: Patient appears well-developed and well-nourished. Obese No distress.  HEENT: head atraumatic, normocephalic, pupils equal and reactive to light, neck supple, throat within normal limits Cardiovascular: Normal rate, regular rhythm and normal heart sounds.  No murmur heard. No BLE edema. Pulmonary/Chest: Effort normal and breath sounds normal. No respiratory distress. Abdominal: Soft.  There is no tenderness. Psychiatric: Patient has a normal mood and affect.  behavior is normal. Judgment and thought content normal. Muscular skeletal: slow to get up from chair, using hand to assist ( over 30 seconds) she forgot to bring her cane today, slow and cautious gate using furniture to assist   Recent Results (from the past 2160 hour(s))  POCT HgB A1C     Status: Abnormal   Collection Time: 12/18/16 11:48 AM  Result Value Ref Range   Hemoglobin A1C 7.1       PHQ2/9: Depression screen [facility] 2/9 11/23/2016 11/13/2016 09/13/2016 08/14/2016 07/19/2016  Decreased Interest 0 0 0 0 0  Down, Depressed, Hopeless 0 0 0 0 0  PHQ - 2 Score 0 0 0 0 0     Fall Risk: Fall Risk  11/23/2016 11/13/2016 09/13/2016 08/14/2016 07/19/2016  Falls in the past year? Yes Yes No No No  Comment - - - - -  Number falls in past yr: 1 1 - - -  Injury with Fall? Yes Yes - - -  Risk Factor Category  - High Fall Risk - - -     Functional Status Survey: Is the patient deaf or have difficulty hearing?: No Does the patient have difficulty seeing, even when wearing glasses/contacts?: No Does the patient have difficulty concentrating, remembering, or making decisions?: No Does the patient have difficulty walking or climbing stairs?: Yes Does the patient have difficulty dressing or bathing?: Yes Does the patient have difficulty doing errands alone such as visiting a doctor's office or shopping?: Yes    Assessment & Plan  1. Dyslipidemia associated with type 2 diabetes mellitus ([facility])  - POCT HgB A1C - urine micro   2. Aortic atherosclerosis ([facility])  Continue medications and life style modification  3. Type 2 diabetes mellitus with microalbuminuria, without long-term current use of insulin ([facility])  - Urine Microalbumin w/creat. ratio  4. Essential hypertension  At goal   5. Gastroesophageal reflux disease without esophagitis  stable  6. Chronic midline low back pain without sciatica  Keep follow up with pain clinic - Dr. Andree Elk  7. Anemia, unspecified type  -  Hemoglobin  and hematocrit, blood - Iron, TIBC and Ferritin Panel; Future

## 2016-12-19 ENCOUNTER — Encounter: Payer: Self-pay | Admitting: Family Medicine

## 2016-12-19 DIAGNOSIS — D509 Iron deficiency anemia, unspecified: Secondary | ICD-10-CM | POA: Insufficient documentation

## 2016-12-19 LAB — IRON,TIBC AND FERRITIN PANEL
%SAT: 13 % (calc) (ref 11–50)
Ferritin: 18 ng/mL — ABNORMAL LOW (ref 20–288)
Iron: 43 ug/dL — ABNORMAL LOW (ref 45–160)
TIBC: 325 mcg/dL (calc) (ref 250–450)

## 2016-12-19 LAB — MICROALBUMIN / CREATININE URINE RATIO
Creatinine, Urine: 70 mg/dL (ref 20–275)
Microalb Creat Ratio: 93 mcg/mg creat — ABNORMAL HIGH (ref <30)
Microalb, Ur: 6.5 mg/dL

## 2016-12-19 LAB — HEMOGLOBIN AND HEMATOCRIT, BLOOD
HCT: 34.2 % — ABNORMAL LOW (ref 35.0–45.0)
Hemoglobin: 11.2 g/dL — ABNORMAL LOW (ref 11.7–15.5)

## 2016-12-31 ENCOUNTER — Ambulatory Visit: Payer: Medicare Other | Admitting: Nurse Practitioner

## 2017-01-04 ENCOUNTER — Telehealth: Payer: Self-pay

## 2017-01-04 NOTE — Telephone Encounter (Signed)
done

## 2017-01-04 NOTE — Telephone Encounter (Signed)
Copied from [place] #[id number]. Topic: General - Other >> Jan 04, 2017  9:21 AM Lennox Solders wrote: Reason for CRM: CONNIE from [facility] is calling. Marlowe Kays is requesting verbal order for OT for twice a wk for 2 wks. Callback number [phone]

## 2017-01-07 ENCOUNTER — Ambulatory Visit: Payer: Medicare Other | Attending: Nurse Practitioner | Admitting: Nurse Practitioner

## 2017-01-07 ENCOUNTER — Other Ambulatory Visit: Payer: Self-pay

## 2017-01-07 ENCOUNTER — Encounter: Payer: Self-pay | Admitting: Nurse Practitioner

## 2017-01-07 VITALS — BP 160/69 | HR 70 | Temp 97.7°F | Resp 18 | Ht 61.0 in | Wt 219.0 lb

## 2017-01-07 DIAGNOSIS — M810 Age-related osteoporosis without current pathological fracture: Secondary | ICD-10-CM | POA: Diagnosis not present

## 2017-01-07 DIAGNOSIS — B354 Tinea corporis: Secondary | ICD-10-CM | POA: Insufficient documentation

## 2017-01-07 DIAGNOSIS — G894 Chronic pain syndrome: Secondary | ICD-10-CM | POA: Diagnosis not present

## 2017-01-07 DIAGNOSIS — E1369 Other specified diabetes mellitus with other specified complication: Secondary | ICD-10-CM | POA: Diagnosis not present

## 2017-01-07 DIAGNOSIS — I700 Atherosclerosis of aorta: Secondary | ICD-10-CM | POA: Insufficient documentation

## 2017-01-07 DIAGNOSIS — E785 Hyperlipidemia, unspecified: Secondary | ICD-10-CM | POA: Insufficient documentation

## 2017-01-07 DIAGNOSIS — N200 Calculus of kidney: Secondary | ICD-10-CM | POA: Insufficient documentation

## 2017-01-07 DIAGNOSIS — K219 Gastro-esophageal reflux disease without esophagitis: Secondary | ICD-10-CM | POA: Insufficient documentation

## 2017-01-07 DIAGNOSIS — Z79891 Long term (current) use of opiate analgesic: Secondary | ICD-10-CM | POA: Insufficient documentation

## 2017-01-07 DIAGNOSIS — D509 Iron deficiency anemia, unspecified: Secondary | ICD-10-CM | POA: Diagnosis not present

## 2017-01-07 DIAGNOSIS — E559 Vitamin D deficiency, unspecified: Secondary | ICD-10-CM | POA: Diagnosis not present

## 2017-01-07 DIAGNOSIS — I10 Essential (primary) hypertension: Secondary | ICD-10-CM | POA: Diagnosis not present

## 2017-01-07 DIAGNOSIS — G8929 Other chronic pain: Secondary | ICD-10-CM | POA: Diagnosis not present

## 2017-01-07 DIAGNOSIS — E669 Obesity, unspecified: Secondary | ICD-10-CM | POA: Diagnosis not present

## 2017-01-07 DIAGNOSIS — K429 Umbilical hernia without obstruction or gangrene: Secondary | ICD-10-CM | POA: Diagnosis not present

## 2017-01-07 DIAGNOSIS — M159 Polyosteoarthritis, unspecified: Secondary | ICD-10-CM

## 2017-01-07 DIAGNOSIS — E119 Type 2 diabetes mellitus without complications: Secondary | ICD-10-CM | POA: Diagnosis not present

## 2017-01-07 DIAGNOSIS — H401133 Primary open-angle glaucoma, bilateral, severe stage: Secondary | ICD-10-CM | POA: Diagnosis not present

## 2017-01-07 DIAGNOSIS — M545 Low back pain, unspecified: Secondary | ICD-10-CM

## 2017-01-07 DIAGNOSIS — M1990 Unspecified osteoarthritis, unspecified site: Secondary | ICD-10-CM | POA: Diagnosis not present

## 2017-01-07 DIAGNOSIS — Z79899 Other long term (current) drug therapy: Secondary | ICD-10-CM | POA: Insufficient documentation

## 2017-01-07 DIAGNOSIS — Z7984 Long term (current) use of oral hypoglycemic drugs: Secondary | ICD-10-CM | POA: Insufficient documentation

## 2017-01-07 DIAGNOSIS — M109 Gout, unspecified: Secondary | ICD-10-CM | POA: Insufficient documentation

## 2017-01-07 DIAGNOSIS — M25561 Pain in right knee: Secondary | ICD-10-CM | POA: Diagnosis not present

## 2017-01-07 DIAGNOSIS — M549 Dorsalgia, unspecified: Secondary | ICD-10-CM | POA: Diagnosis present

## 2017-01-07 MED ORDER — OXYCODONE-ACETAMINOPHEN 7.5-325 MG PO TABS: 1.0000 | ORAL_TABLET | Freq: Four times a day (QID) | ORAL | 0 refills | Status: DC | PRN

## 2017-01-07 NOTE — Progress Notes (Signed)
Nursing Pain Medication Assessment:  Safety precautions to be maintained throughout the outpatient stay will include: orient to surroundings, keep bed in low position, maintain call bell within reach at all times, provide assistance with transfer out of bed and ambulation.  Medication Inspection Compliance: Pill count conducted under aseptic conditions, in front of the patient. Neither the pills nor the bottle was removed from the patient's sight at any time. Once count was completed pills were immediately returned to the patient in their original bottle.  Medication: Oxycodone/APAP Pill/Patch Count: 5 of 90 pills remain Pill/Patch Appearance: Markings consistent with prescribed medication Bottle Appearance: Standard pharmacy container. Clearly labeled. Filled Date:10/ 3/ 2018 Last Medication intake:  Today

## 2017-01-07 NOTE — Progress Notes (Signed)
Patient's Name: Taylor [name]  MRN: [id number]  Referring Provider: [name], MD  DOB: 09-02-1932  PCP: [name], MD  DOS: 01/07/2017  Note by: Vevelyn Francois NP  Service setting: Ambulatory outpatient  Specialty: Interventional Pain Management  Location: [facility] (AMB) Pain Management Facility    Patient type: Established    Primary Reason(s) for Visit: Encounter for prescription drug management. (Level of risk: moderate)  CC: Back Pain; Hip Pain; and Hand Pain  HPI  Taylor [name] is a 83 y.o. year old, female patient, who comes today for a medication management evaluation. She has Lump or mass in breast; Umbilical hernia; Osteoarthritis, multiple sites; Type 2 diabetes mellitus with microalbuminuria ([place]); Glaucoma; BP (high blood pressure); Chronic pain; Tinea corporis; Calculus of kidney; Vaginal dryness; Dyslipidemia associated with type 2 diabetes mellitus ([place]); Chronic midline low back pain without sciatica; Chronic pain of right knee; Long term current use of opiate analgesic; Primary open angle glaucoma (POAG) of both eyes, severe stage; Hypertensive retinopathy; Vitamin D deficiency; Anemia, unspecified; Chronic pain syndrome; Aortic atherosclerosis ([place]); Anterolisthesis; and Iron deficiency anemia on their problem list. Her primarily concern today is the Back Pain; Hip Pain; and Hand Pain  Pain Assessment: Location: Lower, Right, Left Back Radiating:   Onset: More than a month ago Duration: Chronic pain Quality: Constant Severity: 6 /10 (self-reported pain score)  Note: Reported level is compatible with observation. Clinically the patient looks like a 3/10       Information on the proper use of the pain scale provided to the patient today. When using our objective Pain Scale, levels between 6 and 10/10 are said to belong in an emergency room, as it progressively worsens from a 6/10, described as severely limiting, requiring emergency care not usually available at an  outpatient pain management facility. At a 6/10 level, communication becomes difficult and requires great effort. Assistance to reach the emergency department may be required. Facial flushing and profuse sweating along with potentially dangerous increases in heart rate and blood pressure will be evident. Effect on ADL:   Timing:   Modifying factors:    Taylor [name] was last scheduled for an appointment on 11/13/2016 for medication management. During today's appointment we reviewed Taylor [name]'s chronic pain status, as well as her outpatient medication regimen. She admits that her pain is stable and that she is doing well. She continues to undergo PT secondary to her fall. She admits that this is going well also.  She denies any side effects of her medication . She denies any concerns today.   The patient  reports that she does not use drugs. Her body mass index is 41.38 kg/m.  Further details on both, my assessment(s), as well as the proposed treatment plan, please see below.  Controlled Substance Pharmacotherapy Assessment REMS (Risk Evaluation and Mitigation Strategy)  Analgesic:Oxycodone/acetaminophen 7.5/325 mg 3times daily MME/day:22.5 mg/day.    Taylor Shorter, RN  01/07/2017  2:19 PM  Signed Nursing Pain Medication Assessment:  Safety precautions to be maintained throughout the outpatient stay will include: orient to surroundings, keep bed in low position, maintain call bell within reach at all times, provide assistance with transfer out of bed and ambulation.  Medication Inspection Compliance: Pill count conducted under aseptic conditions, in front of the patient. Neither the pills nor the bottle was removed from the patient's sight at any time. Once count was completed pills were immediately returned to the patient in their original bottle.  Medication: Oxycodone/APAP Pill/Patch Count: 5  of 90 pills remain Pill/Patch Appearance: Markings consistent with prescribed medication Bottle  Appearance: Standard pharmacy container. Clearly labeled. Filled Date:10/ 3/ 2018 Last Medication intake:  Today   Pharmacokinetics: Liberation and absorption (onset of action): WNL Distribution (time to peak effect): WNL Metabolism and excretion (duration of action): WNL         Pharmacodynamics: Desired effects: Analgesia: [name] reports >50% benefit. Functional ability: Patient reports that medication allows her to accomplish basic ADLs Clinically meaningful improvement in function (CMIF): Sustained CMIF goals met Perceived effectiveness: Described as relatively effective, allowing for increase in activities of daily living (ADL) Undesirable effects: Side-effects or Adverse reactions: None reported Monitoring: [place] PMP: Online review of the past 72-monthperiod conducted. Compliant with practice rules and regulations Last UDS on record: Summary  Date Value Ref Range Status  09/13/2016 FINAL  Final    Comment:    ==================================================================== TOXASSURE SELECT 13 (MW) ==================================================================== Test                             Result       Flag       Units Drug Present and Declared for Prescription Verification   Oxycodone                      175          EXPECTED   ng/mg creat   Oxymorphone                    591          EXPECTED   ng/mg creat   Noroxycodone                   644          EXPECTED   ng/mg creat   Noroxymorphone                 234          EXPECTED   ng/mg creat    Sources of oxycodone are scheduled prescription medications.    Oxymorphone, noroxycodone, and noroxymorphone are expected    metabolites of oxycodone. Oxymorphone is also available as a    scheduled prescription medication. ==================================================================== Test                      Result    Flag   Units      Ref Range   Creatinine              125              mg/dL       >=20 ==================================================================== Declared Medications:  The flagging and interpretation on this report are based on the  following declared medications.  Unexpected results may arise from  inaccuracies in the declared medications.  **Note: The testing scope of this panel includes these medications:  Oxycodone (Percocet)  **Note: The testing scope of this panel does not include following  reported medications:  Acetaminophen (Percocet)  Acetaminophen (Tylenol)  Alendronate (Fosamax)  Amlodipine (Norvasc)  Bimatoprost (Lumigan)  Cholecalciferol  Cyclobenzaprine (Flexeril)  Estrogen (Premarin)  Fluticasone (Flonase)  Ketoconazole (Nizoral)  Lidocaine (Xylocaine)  Metformin  Omega-3 Fatty Acids (Lovaza)  Pravastatin (Pravachol)  Quinapril (Accupril)  Ranitidine (Zantac)  Vitamin D2 (Drisdol) ==================================================================== For clinical consultation, please call ([phone] ====================================================================    UDS interpretation: Compliant  Medication Assessment Form: Reviewed. Patient indicates being compliant with therapy Treatment compliance: Compliant Risk Assessment Profile: Aberrant behavior: See prior evaluations. None observed or detected today Comorbid factors increasing risk of overdose: See prior notes. No additional risks detected today Risk of substance use disorder (SUD): Low Opioid Risk Tool - 01/07/17 1417      Family History of Substance Abuse   Alcohol  Negative    Illegal Drugs  Negative    Rx Drugs  Negative      Personal History of Substance Abuse   Alcohol  Negative    Illegal Drugs  Negative    Rx Drugs  Negative      Age   Age between 55-45 years   No      Psychological Disease   Psychological Disease  Negative    Depression  Negative      Total Score   Opioid Risk Tool Scoring  0    Opioid Risk Interpretation  Low  Risk      ORT Scoring interpretation table:  Score <3 = Low Risk for SUD  Score between 4-7 = Moderate Risk for SUD  Score >8 = High Risk for Opioid Abuse   Risk Mitigation Strategies:  Patient Counseling: Covered Patient-Prescriber Agreement (PPA): Present and active  Notification to other healthcare providers: Done  Pharmacologic Plan: No change in therapy, at this time  Laboratory Chemistry  Inflammation Markers (CRP: Acute Phase) (ESR: Chronic Phase) No results found for: CRP, ESRSEDRATE               Renal Function Markers Lab Results  Component Value Date   BUN 19 08/14/2016   CREATININE 1.26 (H) 08/14/2016   GFRAA 46 (L) 08/14/2016   GFRNONAA 40 (L) 08/14/2016                 Hepatic Function Markers Lab Results  Component Value Date   AST 9 (L) 08/14/2016   ALT 6 08/14/2016   ALBUMIN 4.0 08/14/2016   ALKPHOS 56 08/14/2016                 Electrolytes Lab Results  Component Value Date   NA 140 08/14/2016   K 5.3 08/14/2016   CL 105 08/14/2016   CALCIUM 9.6 08/14/2016                 Neuropathy Markers No results found for: [id number]               Bone Pathology Markers Lab Results  Component Value Date   ALKPHOS 56 08/14/2016   VD25OH 16 (L) 08/14/2016   CALCIUM 9.6 08/14/2016                 Rheumatology Markers No results found for: LABURIC, URICUR              Coagulation Parameters Lab Results  Component Value Date   PLT 331 08/14/2016                 Cardiovascular Markers Lab Results  Component Value Date   HGB 11.2 (L) 12/18/2016   HCT 34.2 (L) 12/18/2016                 CA Markers No results found for: CEA, CA125, LABCA2               Note: Lab results reviewed.  Recent Diagnostic Imaging Results  US BREAST LTD UNI RIGHT INC AXILLA CLINICAL DATA:  The patient was  called back for a right breast mass  EXAM: DIGITAL DIAGNOSTIC RIGHT MAMMOGRAM  ULTRASOUND RIGHT BREAST  COMPARISON:  Previous exam(s).  ACR Breast  Density Category b: There are scattered areas of fibroglandular density.  FINDINGS: The right breast mass persists on additional imaging.  On physical exam, no suspicious lumps are identified.  Targeted ultrasound is performed, showing fibrocystic changes accounting for the right breast mass.  IMPRESSION: No mammographic or sonographic evidence of malignancy.  RECOMMENDATION: Annual screening mammography.  I have discussed the findings and recommendations with the patient. Results were also provided in writing at the conclusion of the visit. If applicable, a reminder letter will be sent to the patient regarding the next appointment.  BI-RADS CATEGORY  2: Benign.  Electronically Signed   By: Dorise Bullion III M.D   On: 08/09/2016 12:38 MM DIAG BREAST TOMO UNI RIGHT CLINICAL DATA:  The patient was called back for a right breast mass  EXAM: DIGITAL DIAGNOSTIC RIGHT MAMMOGRAM  ULTRASOUND RIGHT BREAST  COMPARISON:  Previous exam(s).  ACR Breast Density Category b: There are scattered areas of fibroglandular density.  FINDINGS: The right breast mass persists on additional imaging.  On physical exam, no suspicious lumps are identified.  Targeted ultrasound is performed, showing fibrocystic changes accounting for the right breast mass.  IMPRESSION: No mammographic or sonographic evidence of malignancy.  RECOMMENDATION: Annual screening mammography.  I have discussed the findings and recommendations with the patient. Results were also provided in writing at the conclusion of the visit. If applicable, a reminder letter will be sent to the patient regarding the next appointment.  BI-RADS CATEGORY  2: Benign.  Electronically Signed   By: Dorise Bullion III M.D   On: 08/09/2016 12:38  Complexity Note: Imaging results reviewed. Results shared with Ms. Hamor, using [employer].                         Meds   Current Outpatient Medications:  .   acetaminophen (TYLENOL) 500 MG tablet, Take 1 tablet (500 mg total) by mouth 3 (three) times daily. (Patient taking differently: Take 500 mg by mouth as needed. ), Disp: 90 tablet, Rfl: 0 .  alendronate (FOSAMAX) 70 MG tablet, TAKE 1 TABLET WEEKLY, Disp: 12 tablet, Rfl: 1 .  amLODipine (NORVASC) 10 MG tablet, Take 1 tablet (10 mg total) by mouth daily., Disp: 90 tablet, Rfl: 1 .  BESIVANCE 0.6 % SUSP, 1 DROP IN SURGICAL EYE TWICE A DAY STARTING 1 DAY BEFORE SURGERY, Disp: , Rfl: 1 .  bimatoprost (LUMIGAN) 0.01 % SOLN, Place 1 drop into both eyes daily. , Disp: , Rfl:  .  brimonidine-timolol (COMBIGAN) 0.2-0.5 % ophthalmic solution, Place 1 drop into both eyes See admin instructions. 1 drop into both eyes in the morning, then 1 drop into both eyes 8 hours later, Disp: , Rfl:  .  Bromfenac Sodium 0.09 % SOLN, APPLY 1 DROP INTO AFFECTED EYE DAILY, Disp: , Rfl: 1 .  Cholecalciferol (VITAMIN D-1000 MAX ST) 1000 UNITS tablet, Take 1,000 Units by mouth daily. Reported on 08/05/2015, Disp: , Rfl:  .  conjugated estrogens (PREMARIN) vaginal cream, Place 1 Applicatorful vaginally daily., Disp: 42.5 g, Rfl: 5 .  cyclobenzaprine (FLEXERIL) 5 MG tablet, Take 1 tablet (5 mg total) by mouth 3 (three) times daily., Disp: 30 tablet, Rfl: 0 .  DUREZOL 0.05 % EMUL, Place 1 drop into both eyes 4 (four) times daily. , Disp: , Rfl:  .  fluticasone (FLONASE) 50 MCG/ACT nasal spray, Place 2 sprays into both nostrils daily., Disp: 16 g, Rfl: 1 .  glucose blood test strip, Use as instructed, Disp: 100 each, Rfl: 12 .  ketoconazole (NIZORAL) 2 % cream, Apply 1 application topically daily., Disp: 15 g, Rfl: 0 .  ketorolac (ACULAR) 0.5 % ophthalmic solution, , Disp: , Rfl:  .  lidocaine (XYLOCAINE) 5 % ointment, Apply 1 application topically 3 (three) times daily as needed. 2 grams, Disp: 35.44 g, Rfl: 0 .  metFORMIN (GLUCOPHAGE) 500 MG tablet, Take 1 tablet (500 mg total) by mouth 2 (two) times daily with a meal., Disp: 180  tablet, Rfl: 1 .  omega-3 acid ethyl esters (LOVAZA) 1 g capsule, Take 1 capsule (1 g total) by mouth 2 (two) times daily., Disp: 180 capsule, Rfl: 1 .  ONETOUCH DELICA LANCETS FINE MISC, 1 each by Does not apply route daily. 30 G Lancets- One Touch Delica, Disp: 309 each, Rfl: 1 .  [START ON 03/08/2017] oxyCODONE-acetaminophen (PERCOCET) 7.5-325 MG tablet, Take 1 tablet every 6 (six) hours as needed by mouth for moderate pain or severe pain., Disp: 90 tablet, Rfl: 0 .  pravastatin (PRAVACHOL) 40 MG tablet, TAKE 1 TABLET (40 MG TOTAL) BY MOUTH AT BEDTIME., Disp: 90 tablet, Rfl: 1 .  quinapril (ACCUPRIL) 40 MG tablet, TAKE 1 TABLET (40 MG TOTAL) BY MOUTH DAILY., Disp: 90 tablet, Rfl: 1 .  ranitidine (ZANTAC) 150 MG tablet, TAKE 1 TABLET BY MOUTH DAILY AS NEEDED FOR HEARTBURN, *TAKE THIS IN PLACCE OF OMEPRAZOLE*, Disp: 90 tablet, Rfl: 1 .  Vitamin D, Ergocalciferol, (DRISDOL) 50000 units CAPS capsule, Take 1 capsule (50,000 Units total) by mouth every 7 (seven) days., Disp: 12 capsule, Rfl: 0  ROS  Constitutional: Denies any fever or chills Gastrointestinal: No reported hemesis, hematochezia, vomiting, or acute GI distress Musculoskeletal: Denies any acute onset joint swelling, redness, loss of ROM, or weakness Neurological: No reported episodes of acute onset apraxia, aphasia, dysarthria, agnosia, amnesia, paralysis, loss of coordination, or loss of consciousness  Allergies  Ms. Kem has No Known Allergies.  [place]  Drug: Ms. Cangemi  reports that she does not use drugs. Alcohol:  reports that she does not drink alcohol. Tobacco:  reports that  has never smoked. she has never used smokeless tobacco. Medical:  has a past medical history of Abnormal mammogram, unspecified (2013), Breast screening, unspecified (2013), Diabetes mellitus without complication ([place]) (4076), GERD (gastroesophageal reflux disease), Glaucoma (2003), Gout, Hyperlipidemia (2008), Hypertension (1980's), Lump or mass in breast  (01/03/2012), Obesity, unspecified (2013), Osteoporosis, Shingles (2013), and Special screening for malignant neoplasms, colon (2013). Surgical: Ms. Tino  has a past surgical history that includes Abdominal hysterectomy (1958); Knee surgery (2009,2011,2012); Lipoma excision (1998); Spine surgery (2004); Tonsillectomy; Colonoscopy (2003); Coronary artery bypass graft (Left, 01/03/2012,01/24/2012); Cataract extraction, bilateral (Bilateral); Cholecystectomy; Back surgery; and Knee surgery. Family: family history includes Cancer in her mother; Heart disease in her father; Stroke in her mother.  Constitutional Exam  General appearance: Well nourished, well developed, and well hydrated. In no apparent acute distress Vitals:   01/07/17 1412 01/07/17 1414  BP:  (!) 160/69  Pulse: 70   Resp: 18   Temp: 97.7 F (36.5 C)   SpO2: 100%   Weight: 219 lb (99.3 kg)   Height: 5' 1"  (1.549 m)    BMI Assessment: Estimated body mass index is 41.38 kg/m as calculated from the following:   Height as of this encounter: 5' 1"  (1.549 m).  Weight as of this encounter: 219 lb (99.3 kg). Psych/Mental status: Alert, oriented x 3 (person, place, & time)       Eyes: PERLA Respiratory: No evidence of acute respiratory distress  Cervical Spine Area Exam  Skin & Axial Inspection: No masses, redness, edema, swelling, or associated skin lesions Alignment: Symmetrical Functional ROM: Unrestricted ROM      Stability: No instability detected Muscle Tone/Strength: Functionally intact. No obvious neuro-muscular anomalies detected. Sensory (Neurological): Unimpaired Palpation: No palpable anomalies              Upper Extremity (UE) Exam    Side: Right upper extremity  Side: Left upper extremity  Skin & Extremity Inspection: Skin color, temperature, and hair growth are WNL. No peripheral edema or cyanosis. No masses, redness, swelling, asymmetry, or associated skin lesions. No contractures.  Skin & Extremity  Inspection: Skin color, temperature, and hair growth are WNL. No peripheral edema or cyanosis. No masses, redness, swelling, asymmetry, or associated skin lesions. No contractures.  Functional ROM: Unrestricted ROM          Functional ROM: Unrestricted ROM          Muscle Tone/Strength: Functionally intact. No obvious neuro-muscular anomalies detected.  Muscle Tone/Strength: Functionally intact. No obvious neuro-muscular anomalies detected.  Sensory (Neurological): Unimpaired          Sensory (Neurological): Unimpaired          Palpation: No palpable anomalies              Palpation: No palpable anomalies              Specialized Test(s): Deferred         Specialized Test(s): Deferred          Thoracic Spine Area Exam  Skin & Axial Inspection: No masses, redness, or swelling Alignment: Symmetrical Functional ROM: Unrestricted ROM Stability: No instability detected Muscle Tone/Strength: Functionally intact. No obvious neuro-muscular anomalies detected. Sensory (Neurological): Unimpaired Muscle strength & Tone: No palpable anomalies  Lumbar Spine Area Exam  Skin & Axial Inspection: No masses, redness, or swelling Alignment: Symmetrical Functional ROM: Unrestricted ROM      Stability: No instability detected Muscle Tone/Strength: Functionally intact. No obvious neuro-muscular anomalies detected. Sensory (Neurological): Unimpaired Palpation: No palpable anomalies       Provocative Tests: Lumbar Hyperextension and rotation test: evaluation deferred today       Lumbar Lateral bending test: evaluation deferred today       Patrick's Maneuver: evaluation deferred today                    Gait & Posture Assessment  Ambulation: Patient ambulates using a walker Gait: Relatively normal for age and body habitus Posture: WNL   Lower Extremity Exam    Side: Right lower extremity  Side: Left lower extremity  Skin & Extremity Inspection: Skin color, temperature, and hair growth are WNL. No  peripheral edema or cyanosis. No masses, redness, swelling, asymmetry, or associated skin lesions. No contractures.  Skin & Extremity Inspection: Skin color, temperature, and hair growth are WNL. No peripheral edema or cyanosis. No masses, redness, swelling, asymmetry, or associated skin lesions. No contractures.  Functional ROM: Unrestricted ROM          Functional ROM: Unrestricted ROM          Muscle Tone/Strength: Functionally intact. No obvious neuro-muscular anomalies detected.  Muscle Tone/Strength: Functionally intact. No obvious neuro-muscular anomalies detected.  Sensory (Neurological): Unimpaired  Sensory (Neurological): Unimpaired  Palpation: No palpable anomalies  Palpation: No palpable anomalies   Assessment  Primary Diagnosis & Pertinent Problem List: The primary encounter diagnosis was Chronic midline low back pain without sciatica. Diagnoses of Osteoarthritis of multiple joints, unspecified osteoarthritis type, Chronic pain of right knee, and Chronic pain syndrome were also pertinent to this visit.  Status Diagnosis  Controlled Controlled Controlled 1. Chronic midline low back pain without sciatica   2. Osteoarthritis of multiple joints, unspecified osteoarthritis type   3. Chronic pain of right knee   4. Chronic pain syndrome     Problems updated and reviewed during this visit: No problems updated. Plan of Care  Pharmacotherapy (Medications Ordered): Meds ordered this encounter  Medications  . oxyCODONE-acetaminophen (PERCOCET) 7.5-325 MG tablet    Sig: Take 1 tablet every 6 (six) hours as needed by mouth for moderate pain or severe pain.    Dispense:  90 tablet    Refill:  0    Do not place this medication, or any other prescription from our practice, on "Automatic Refill". Patient may have prescription filled one day early if pharmacy is closed on scheduled refill date. Do not fill until:02/06/2017 To last until:03/08/2017    Order Specific Question:   Supervising  Provider    Answer:   Milinda Pointer [phone]  . oxyCODONE-acetaminophen (PERCOCET) 7.5-325 MG tablet    Sig: Take 1 tablet every 6 (six) hours as needed by mouth for moderate pain or severe pain.    Dispense:  90 tablet    Refill:  0    Do not place this medication, or any other prescription from our practice, on "Automatic Refill". Patient may have prescription filled one day early if pharmacy is closed on scheduled refill date. Do not fill until:03/08/2017 To last until:04/07/2017    Order Specific Question:   Supervising Provider    Answer:   [name] [[id number]]  This SmartLink is deprecated. Use AVSMEDLIST instead to display the medication list for a patient. Medications administered today: [name] had no medications administered during this visit. Lab-work, procedure(s), and/or referral(s): No orders of the defined types were placed in this encounter.  Imaging and/or referral(s): None  Interventional therapies: Planned, scheduled, and/or pending:   Not at this time.   Provider-requested follow-up: Return in about 2 months (around 03/09/2017) for MedMgmt.  Future Appointments  Date Time Provider [place]  03/04/2017  1:30 PM Vevelyn Francois, NP [facility] None  04/19/2017 10:40 AM [name], MD [place] [facility]  06/03/2017  1:45 PM Hayden Pedro, MD [facility] None   Primary Care Physician: [name], MD Location: [facility] Note by: Vevelyn Francois NP Date: 01/07/2017; Time: 3:28 PM  Pain Score Disclaimer: We use the NRS-11 scale. This is a self-reported, subjective measurement of pain severity with only modest accuracy. It is used primarily to identify changes within a particular patient. It must be understood that outpatient pain scales are significantly less accurate that those used for research, where they can be applied under ideal controlled circumstances with minimal exposure to variables. In reality, the score  is likely to be a combination of pain intensity and pain affect, where pain affect describes the degree of emotional arousal or changes in action readiness caused by the sensory experience of pain. Factors such as social and work situation, setting, emotional state, anxiety levels, expectation, and prior pain experience may influence pain perception and show large inter-individual differences that may also be affected by time  variables.  Patient instructions provided during this appointment: Patient Instructions    ____________________________________________________________________________________________  Medication Rules  Applies to: All patients receiving prescriptions (written or electronic).  Pharmacy of record: Pharmacy where electronic prescriptions will be sent. If written prescriptions are taken to a different pharmacy, please inform the nursing staff. The pharmacy listed in the electronic medical record should be the one where you would like electronic prescriptions to be sent.  Prescription refills: Only during scheduled appointments. Applies to both, written and electronic prescriptions.  NOTE: The following applies primarily to controlled substances (Opioid* Pain Medications).   Patient's responsibilities: 1. Pain Pills: Bring all pain pills to every appointment (except for procedure appointments). 2. Pill Bottles: Bring pills in original pharmacy bottle. Always bring newest bottle. Bring bottle, even if empty. 3. Medication refills: You are responsible for knowing and keeping track of what medications you need refilled. The day before your appointment, write a list of all prescriptions that need to be refilled. Bring that list to your appointment and give it to the admitting nurse. Prescriptions will be written only during appointments. If you forget a medication, it will not be "Called in", "Faxed", or "electronically sent". You will need to get another appointment to get these  prescribed. 4. Prescription Accuracy: You are responsible for carefully inspecting your prescriptions before leaving our office. Have the discharge nurse carefully go over each prescription with you, before taking them home. Make sure that your name is accurately spelled, that your address is correct. Check the name and dose of your medication to make sure it is accurate. Check the number of pills, and the written instructions to make sure they are clear and accurate. Make sure that you are given enough medication to last until your next medication refill appointment. 5. Taking Medication: Take medication as prescribed. Never take more pills than instructed. Never take medication more frequently than prescribed. Taking less pills or less frequently is permitted and encouraged, when it comes to controlled substances (written prescriptions).  6. Inform other Doctors: Always inform, all of your healthcare providers, of all the medications you take. 7. Pain Medication from other Providers: You are not allowed to accept any additional pain medication from any other Doctor or Healthcare provider. There are two exceptions to this rule. (see below) In the event that you require additional pain medication, you are responsible for notifying us, as stated below. 8. Medication Agreement: You are responsible for carefully reading and following our Medication Agreement. This must be signed before receiving any prescriptions from our practice. Safely store a copy of your signed Agreement. Violations to the Agreement will result in no further prescriptions. (Additional copies of our Medication Agreement are available upon request.) 9. Laws, Rules, & Regulations: All patients are expected to follow all Federal and [employer], [employer], Rules, [employer]. Ignorance of the Laws does not constitute a valid excuse. The use of any illegal substances is prohibited. 10. Adopted CDC guidelines & recommendations: Target dosing  levels will be at or below 60 MME/day. Use of benzodiazepines** is not recommended.  Exceptions: There are only two exceptions to the rule of not receiving pain medications from other Healthcare Providers. 1. Exception #1 (Emergencies): In the event of an emergency (i.e.: accident requiring emergency care), you are allowed to receive additional pain medication. However, you are responsible for: As soon as you are able, call our office [phone], at any time of the day or night, and leave a message stating your name, the date  and nature of the emergency, and the name and dose of the medication prescribed. In the event that your call is answered by a member of our staff, make sure to document and save the date, time, and the name of the person that took your information.  2. Exception #2 (Planned Surgery): In the event that you are scheduled by another doctor or dentist to have any type of surgery or procedure, you are allowed (for a period no longer than 30 days), to receive additional pain medication, for the acute post-op pain. However, in this case, you are responsible for picking up a copy of our "Post-op Pain Management for Surgeons" handout, and giving it to your surgeon or dentist. This document is available at our office, and does not require an appointment to obtain it. Simply go to our office during business hours (Monday-Thursday from 8:00 AM to 4:00 PM) (Friday 8:00 AM to 12:00 Noon) or if you have a scheduled appointment with [place], prior to your surgery, and ask for it by name. In addition, you will need to provide [place] with your name, name of your surgeon, type of surgery, and date of procedure or surgery.  *Opioid medications include: morphine, codeine, oxycodone, oxymorphone, hydrocodone, hydromorphone, meperidine, tramadol, tapentadol, buprenorphine, fentanyl, methadone. **Benzodiazepine medications include: diazepam (Valium), alprazolam (Xanax), clonazepam (Klonopine), lorazepam (Ativan),  clorazepate (Tranxene), chlordiazepoxide (Librium), estazolam (Prosom), oxazepam (Serax), temazepam (Restoril), triazolam (Halcion)  ____________________________________________________________________________________________  ____________________________________________________________________________________________  Pain Scale  Introduction: The pain score used by this practice is the Verbal Numerical Rating Scale (VNRS-11). This is an 11-point scale. It is for adults and children 10 years or older. There are significant differences in how the pain score is reported, used, and applied. Forget everything you learned in the past and learn this scoring system.  General Information: The scale should reflect your current level of pain. Unless you are specifically asked for the level of your worst pain, or your average pain. If you are asked for one of these two, then it should be understood that it is over the past 24 hours.  Basic Activities of Daily Living (ADL): Personal hygiene, dressing, eating, transferring, and using restroom.  Instructions: Most patients tend to report their level of pain as a combination of two factors, their physical pain and their psychosocial pain. This last one is also known as "suffering" and it is reflection of how physical pain affects you socially and psychologically. From now on, report them separately. From this point on, when asked to report your pain level, report only your physical pain. Use the following table for reference.  Pain Clinic Pain Levels (0-5/10)  Pain Level Score  Description  No Pain 0   Mild pain 1 Nagging, annoying, but does not interfere with basic activities of daily living (ADL). Patients are able to eat, bathe, get dressed, toileting (being able to get on and off the toilet and perform personal hygiene functions), transfer (move in and out of bed or a chair without assistance), and maintain continence (able to control bladder and bowel  functions). Blood pressure and heart rate are unaffected. A normal heart rate for a healthy adult ranges from 60 to 100 bpm (beats per minute).   Mild to moderate pain 2 Noticeable and distracting. Impossible to hide from other people. More frequent flare-ups. Still possible to adapt and function close to normal. It can be very annoying and may have occasional stronger flare-ups. With discipline, patients may get used to it and adapt.   Moderate  pain 3 Interferes significantly with activities of daily living (ADL). It becomes difficult to feed, bathe, get dressed, get on and off the toilet or to perform personal hygiene functions. Difficult to get in and out of bed or a chair without assistance. Very distracting. With effort, it can be ignored when deeply involved in activities.   Moderately severe pain 4 Impossible to ignore for more than a few minutes. With effort, patients may still be able to manage work or participate in some social activities. Very difficult to concentrate. Signs of autonomic nervous system discharge are evident: dilated pupils (mydriasis); mild sweating (diaphoresis); sleep interference. Heart rate becomes elevated (>115 bpm). Diastolic blood pressure (lower number) rises above 100 mmHg. Patients find relief in laying down and not moving.   Severe pain 5 Intense and extremely unpleasant. Associated with frowning face and frequent crying. Pain overwhelms the senses.  Ability to do any activity or maintain social relationships becomes significantly limited. Conversation becomes difficult. Pacing back and forth is common, as getting into a comfortable position is nearly impossible. Pain wakes you up from deep sleep. Physical signs will be obvious: pupillary dilation; increased sweating; goosebumps; brisk reflexes; cold, clammy hands and feet; nausea, vomiting or dry heaves; loss of appetite; significant sleep disturbance with inability to fall asleep or to remain asleep. When  persistent, significant weight loss is observed due to the complete loss of appetite and sleep deprivation.  Blood pressure and heart rate becomes significantly elevated. Caution: If elevated blood pressure triggers a pounding headache associated with blurred vision, then the patient should immediately seek attention at an urgent or emergency care unit, as these may be signs of an impending stroke.    Emergency Department Pain Levels (6-10/10)  Emergency Room Pain 6 Severely limiting. Requires emergency care and should not be seen or managed at an outpatient pain management facility. Communication becomes difficult and requires great effort. Assistance to reach the emergency department may be required. Facial flushing and profuse sweating along with potentially dangerous increases in heart rate and blood pressure will be evident.   Distressing pain 7 Self-care is very difficult. Assistance is required to transport, or use restroom. Assistance to reach the emergency department will be required. Tasks requiring coordination, such as bathing and getting dressed become very difficult.   Disabling pain 8 Self-care is no longer possible. At this level, pain is disabling. The individual is unable to do even the most "basic" activities such as walking, eating, bathing, dressing, transferring to a bed, or toileting. Fine motor skills are lost. It is difficult to think clearly.   Incapacitating pain 9 Pain becomes incapacitating. Thought processing is no longer possible. Difficult to remember your own name. Control of movement and coordination are lost.   The worst pain imaginable 10 At this level, most patients pass out from pain. When this level is reached, collapse of the autonomic nervous system occurs, leading to a sudden drop in blood pressure and heart rate. This in turn results in a temporary and dramatic drop in blood flow to the brain, leading to a loss of consciousness. Fainting is one of the body's  self defense mechanisms. Passing out puts the brain in a calmed state and causes it to shut down for a while, in order to begin the healing process.    Summary: 1. Refer to this scale when providing [place] with your pain level. 2. Be accurate and careful when reporting your pain level. This will help with your care. 3. Over-reporting  your pain level will lead to loss of credibility. 4. Even a level of 1/10 means that there is pain and will be treated at our facility. 5. High, inaccurate reporting will be documented as "Symptom Exaggeration", leading to loss of credibility and suspicions of possible secondary gains such as obtaining more narcotics, or wanting to appear disabled, for fraudulent reasons. 6. Only pain levels of 5 or below will be seen at our facility. 7. Pain levels of 6 and above will be sent to the Emergency Department and the appointment cancelled. ____________________________________________________________________________________________

## 2017-01-07 NOTE — Patient Instructions (Addendum)

## 2017-01-08 ENCOUNTER — Telehealth: Payer: Self-pay | Admitting: Family Medicine

## 2017-01-08 NOTE — Telephone Encounter (Signed)
Copied from [place] #[id number]. Topic: Quick Communication - See Telephone Encounter >> Jan 08, 2017  2:59 PM [place], Laban Emperor wrote: CRM for notification. See Telephone encounter for:  Taylor [name] with [facility] is requesting more time for  PT orders 2 times a week for 3 weeks. [phone] 01/08/17.

## 2017-01-09 NOTE — Telephone Encounter (Signed)
Yes.  Please give verbal order

## 2017-01-23 ENCOUNTER — Telehealth: Payer: Self-pay | Admitting: Family Medicine

## 2017-01-23 NOTE — Telephone Encounter (Signed)
Copied from [place] [phone]. Topic: Quick Communication - See Telephone Encounter >> Jan 23, 2017  8:33 AM Clack, Laban Emperor wrote: CRM for notification. See Telephone encounter for: Marlowe Kays from [place] at [facility] calling to request verbal orders for OT for twice a week for 3 weeks and once a week for 2 weeks.  Connie contact number [phone]   01/23/17.

## 2017-01-23 NOTE — Telephone Encounter (Signed)
Please give verbal order.

## 2017-01-24 ENCOUNTER — Telehealth: Payer: Self-pay | Admitting: Family Medicine

## 2017-01-24 NOTE — Telephone Encounter (Signed)
Spoke with Taylor [name] and gave her verbal orders for OT.

## 2017-01-24 NOTE — Telephone Encounter (Signed)
Copied from [place]. Topic: Quick Communication - See Telephone Encounter >> Jan 24, 2017  1:10 PM Ether Griffins B wrote: CRM for notification. See Telephone encounter for:  [place] with [facility] would like to extend pts PT 2x a week for 8 weeks. Call back number [phone] 01/24/17.

## 2017-01-25 NOTE — Telephone Encounter (Signed)
Please give the okay for extending PT

## 2017-01-25 NOTE — Telephone Encounter (Signed)
Called Joelene Millin at [facility] and gave her verbal order to extend PT 2 x week for 8 weeks.

## 2017-02-05 ENCOUNTER — Other Ambulatory Visit: Payer: Self-pay

## 2017-02-05 ENCOUNTER — Telehealth: Payer: Self-pay | Admitting: Family Medicine

## 2017-02-05 DIAGNOSIS — E1142 Type 2 diabetes mellitus with diabetic polyneuropathy: Secondary | ICD-10-CM

## 2017-02-05 MED ORDER — GLUCOSE BLOOD VI STRP: ORAL_STRIP | 12 refills | Status: DC

## 2017-02-05 NOTE — Telephone Encounter (Signed)
Refill request.  Thanks

## 2017-02-05 NOTE — Telephone Encounter (Signed)
Refill request for diabetic medication:   Blood glucose test strips  Last office visit pertaining to diabetes: 12/18/2016  Follow up visit: 04/19/2017  Lab Results  Component Value Date   HGBA1C 7.1 12/18/2016

## 2017-02-05 NOTE — Telephone Encounter (Signed)
Pt called in to request a refill on her test strips. Pt says that pharmacy was suppose to have been sent request. Pt says that she is now completely out.    Please assist further   Pharmacy: [id number] - [place], [place]

## 2017-02-05 NOTE — Telephone Encounter (Signed)
Sent request to Dr. Ancil Boozer to sign.

## 2017-02-26 NOTE — Telephone Encounter (Signed)
Copied from [place] [phone] [phone]. Topic: General - Other >> Feb 26, 2017  8:54 AM Taylor [name], NT wrote: Reason for CRM: [facility] called ,would like a verbal order to have home health extended to 2 x  a week for 4 wk , due to balance  issues  please call [phone]

## 2017-02-26 NOTE — Telephone Encounter (Signed)
Okay to give extension for home health as requested

## 2017-02-26 NOTE — Telephone Encounter (Signed)
Verbal order given to [place] at [place].

## 2017-03-04 ENCOUNTER — Other Ambulatory Visit: Payer: Self-pay

## 2017-03-04 ENCOUNTER — Encounter: Payer: Self-pay | Admitting: Nurse Practitioner

## 2017-03-04 ENCOUNTER — Ambulatory Visit: Payer: Medicare Other | Attending: Nurse Practitioner | Admitting: Nurse Practitioner

## 2017-03-04 VITALS — BP 143/53 | HR 74 | Temp 98.2°F | Resp 16 | Ht 61.0 in | Wt 215.0 lb

## 2017-03-04 DIAGNOSIS — M545 Low back pain: Secondary | ICD-10-CM

## 2017-03-04 DIAGNOSIS — D509 Iron deficiency anemia, unspecified: Secondary | ICD-10-CM | POA: Insufficient documentation

## 2017-03-04 DIAGNOSIS — K219 Gastro-esophageal reflux disease without esophagitis: Secondary | ICD-10-CM | POA: Insufficient documentation

## 2017-03-04 DIAGNOSIS — M25551 Pain in right hip: Secondary | ICD-10-CM | POA: Insufficient documentation

## 2017-03-04 DIAGNOSIS — G8929 Other chronic pain: Secondary | ICD-10-CM | POA: Diagnosis not present

## 2017-03-04 DIAGNOSIS — M79641 Pain in right hand: Secondary | ICD-10-CM | POA: Diagnosis present

## 2017-03-04 DIAGNOSIS — M159 Polyosteoarthritis, unspecified: Secondary | ICD-10-CM | POA: Diagnosis not present

## 2017-03-04 DIAGNOSIS — M1990 Unspecified osteoarthritis, unspecified site: Secondary | ICD-10-CM | POA: Diagnosis not present

## 2017-03-04 DIAGNOSIS — Z951 Presence of aortocoronary bypass graft: Secondary | ICD-10-CM | POA: Insufficient documentation

## 2017-03-04 DIAGNOSIS — Z9049 Acquired absence of other specified parts of digestive tract: Secondary | ICD-10-CM | POA: Insufficient documentation

## 2017-03-04 DIAGNOSIS — M25561 Pain in right knee: Secondary | ICD-10-CM | POA: Insufficient documentation

## 2017-03-04 DIAGNOSIS — H409 Unspecified glaucoma: Secondary | ICD-10-CM | POA: Diagnosis not present

## 2017-03-04 DIAGNOSIS — G894 Chronic pain syndrome: Secondary | ICD-10-CM | POA: Insufficient documentation

## 2017-03-04 DIAGNOSIS — N200 Calculus of kidney: Secondary | ICD-10-CM | POA: Insufficient documentation

## 2017-03-04 DIAGNOSIS — E785 Hyperlipidemia, unspecified: Secondary | ICD-10-CM | POA: Diagnosis not present

## 2017-03-04 DIAGNOSIS — Z8249 Family history of ischemic heart disease and other diseases of the circulatory system: Secondary | ICD-10-CM | POA: Insufficient documentation

## 2017-03-04 DIAGNOSIS — Z809 Family history of malignant neoplasm, unspecified: Secondary | ICD-10-CM | POA: Insufficient documentation

## 2017-03-04 DIAGNOSIS — Z79891 Long term (current) use of opiate analgesic: Secondary | ICD-10-CM | POA: Insufficient documentation

## 2017-03-04 DIAGNOSIS — N6310 Unspecified lump in the right breast, unspecified quadrant: Secondary | ICD-10-CM | POA: Insufficient documentation

## 2017-03-04 DIAGNOSIS — K429 Umbilical hernia without obstruction or gangrene: Secondary | ICD-10-CM | POA: Diagnosis not present

## 2017-03-04 DIAGNOSIS — B354 Tinea corporis: Secondary | ICD-10-CM | POA: Insufficient documentation

## 2017-03-04 DIAGNOSIS — Z823 Family history of stroke: Secondary | ICD-10-CM | POA: Insufficient documentation

## 2017-03-04 DIAGNOSIS — Z79899 Other long term (current) drug therapy: Secondary | ICD-10-CM | POA: Insufficient documentation

## 2017-03-04 DIAGNOSIS — E119 Type 2 diabetes mellitus without complications: Secondary | ICD-10-CM | POA: Insufficient documentation

## 2017-03-04 DIAGNOSIS — M109 Gout, unspecified: Secondary | ICD-10-CM | POA: Insufficient documentation

## 2017-03-04 DIAGNOSIS — Z9889 Other specified postprocedural states: Secondary | ICD-10-CM | POA: Insufficient documentation

## 2017-03-04 DIAGNOSIS — E669 Obesity, unspecified: Secondary | ICD-10-CM | POA: Diagnosis not present

## 2017-03-04 DIAGNOSIS — Z7984 Long term (current) use of oral hypoglycemic drugs: Secondary | ICD-10-CM | POA: Diagnosis not present

## 2017-03-04 DIAGNOSIS — I10 Essential (primary) hypertension: Secondary | ICD-10-CM | POA: Diagnosis not present

## 2017-03-04 MED ORDER — OXYCODONE-ACETAMINOPHEN 7.5-325 MG PO TABS: 1.0000 | ORAL_TABLET | Freq: Three times a day (TID) | ORAL | 0 refills | Status: DC | PRN

## 2017-03-04 MED ORDER — OXYCODONE-ACETAMINOPHEN 7.5-325 MG PO TABS: 1.0000 | ORAL_TABLET | Freq: Four times a day (QID) | ORAL | 0 refills | Status: DC | PRN

## 2017-03-04 NOTE — Addendum Note (Signed)
Addended by: Vevelyn Francois on: 03/04/2017 01:46 PM   Modules accepted: Orders

## 2017-03-04 NOTE — Progress Notes (Signed)
Nursing Pain Medication Assessment:  Safety precautions to be maintained throughout the outpatient stay will include: orient to surroundings, keep bed in low position, maintain call bell within reach at all times, provide assistance with transfer out of bed and ambulation.  Medication Inspection Compliance: Pill count conducted under aseptic conditions, in front of the patient. Neither the pills nor the bottle was removed from the patient's sight at any time. Once count was completed pills were immediately returned to the patient in their original bottle.  Medication: Oxycodone/APAP Pill/Patch Count: 23 of 90 pills remain Pill/Patch Appearance: Markings consistent with prescribed medication Bottle Appearance: Standard pharmacy container. Clearly labeled. Filled Date: 12/21 2018 Last Medication intake:  Today

## 2017-03-04 NOTE — Patient Instructions (Addendum)
____________________________________________________________________________________________  Medication Rules  Applies to: All patients receiving prescriptions (written or electronic).  Pharmacy of record: Pharmacy where electronic prescriptions will be sent. If written prescriptions are taken to a different pharmacy, please inform the nursing staff. The pharmacy listed in the electronic medical record should be the one where you would like electronic prescriptions to be sent.  Prescription refills: Only during scheduled appointments. Applies to both, written and electronic prescriptions.  NOTE: The following applies primarily to controlled substances (Opioid* Pain Medications).   Patient's responsibilities: 1. Pain Pills: Bring all pain pills to every appointment (except for procedure appointments). 2. Pill Bottles: Bring pills in original pharmacy bottle. Always bring newest bottle. Bring bottle, even if empty. 3. Medication refills: You are responsible for knowing and keeping track of what medications you need refilled. The day before your appointment, write a list of all prescriptions that need to be refilled. Bring that list to your appointment and give it to the admitting nurse. Prescriptions will be written only during appointments. If you forget a medication, it will not be "Called in", "Faxed", or "electronically sent". You will need to get another appointment to get these prescribed. 4. Prescription Accuracy: You are responsible for carefully inspecting your prescriptions before leaving our office. Have the discharge nurse carefully go over each prescription with you, before taking them home. Make sure that your name is accurately spelled, that your address is correct. Check the name and dose of your medication to make sure it is accurate. Check the number of pills, and the written instructions to make sure they are clear and accurate. Make sure that you are given enough medication to  last until your next medication refill appointment. 5. Taking Medication: Take medication as prescribed. Never take more pills than instructed. Never take medication more frequently than prescribed. Taking less pills or less frequently is permitted and encouraged, when it comes to controlled substances (written prescriptions).  6. Inform other Doctors: Always inform, all of your healthcare providers, of all the medications you take. 7. Pain Medication from other Providers: You are not allowed to accept any additional pain medication from any other Doctor or Healthcare provider. There are two exceptions to this rule. (see below) In the event that you require additional pain medication, you are responsible for notifying us, as stated below. 8. Medication Agreement: You are responsible for carefully reading and following our Medication Agreement. This must be signed before receiving any prescriptions from our practice. Safely store a copy of your signed Agreement. Violations to the Agreement will result in no further prescriptions. (Additional copies of our Medication Agreement are available upon request.) 9. Laws, Rules, & Regulations: All patients are expected to follow all Federal and State Laws, Statutes, Rules, & Regulations. Ignorance of the Laws does not constitute a valid excuse. The use of any illegal substances is prohibited. 10. Adopted CDC guidelines & recommendations: Target dosing levels will be at or below 60 MME/day. Use of benzodiazepines** is not recommended.  Exceptions: There are only two exceptions to the rule of not receiving pain medications from other Healthcare Providers. 1. Exception #1 (Emergencies): In the event of an emergency (i.e.: accident requiring emergency care), you are allowed to receive additional pain medication. However, you are responsible for: As soon as you are able, call our office [phone], at any time of the day or night, and leave a message stating your  name, the date and nature of the emergency, and the name and dose of the medication   prescribed. In the event that your call is answered by a member of our staff, make sure to document and save the date, time, and the name of the person that took your information.  2. Exception #2 (Planned Surgery): In the event that you are scheduled by another doctor or dentist to have any type of surgery or procedure, you are allowed (for a period no longer than 30 days), to receive additional pain medication, for the acute post-op pain. However, in this case, you are responsible for picking up a copy of our "Post-op Pain Management for Surgeons" handout, and giving it to your surgeon or dentist. This document is available at our office, and does not require an appointment to obtain it. Simply go to our office during business hours (Monday-Thursday from 8:00 AM to 4:00 PM) (Friday 8:00 AM to 12:00 Noon) or if you have a scheduled appointment with [place], prior to your surgery, and ask for it by name. In addition, you will need to provide [place] with your name, name of your surgeon, type of surgery, and date of procedure or surgery.  *Opioid medications include: morphine, codeine, oxycodone, oxymorphone, hydrocodone, hydromorphone, meperidine, tramadol, tapentadol, buprenorphine, fentanyl, methadone. **Benzodiazepine medications include: diazepam (Valium), alprazolam (Xanax), clonazepam (Klonopine), lorazepam (Ativan), clorazepate (Tranxene), chlordiazepoxide (Librium), estazolam (Prosom), oxazepam (Serax), temazepam (Restoril), triazolam (Halcion)  ____________________________________________________________________________________________   BMI Assessment: Estimated body mass index is 40.62 kg/m as calculated from the following:   Height as of this encounter: 5\' 1"  (1.549 m).   Weight as of this encounter: 215 lb (97.5 kg).  BMI interpretation table: BMI level Category Range association with higher incidence of chronic  pain  <18 kg/m2 Underweight   18.5-24.9 kg/m2 Ideal body weight   25-29.9 kg/m2 Overweight Increased incidence by 20%  30-34.9 kg/m2 Obese (Class I) Increased incidence by 68%  35-39.9 kg/m2 Severe obesity (Class II) Increased incidence by 136%  >40 kg/m2 Extreme obesity (Class III) Increased incidence by 254%   BMI Readings from Last 4 Encounters:  03/04/17 40.62 kg/m  01/07/17 41.38 kg/m  12/18/16 41.38 kg/m  11/23/16 41.42 kg/m   Wt Readings from Last 4 Encounters:  03/04/17 215 lb (97.5 kg)  01/07/17 219 lb (99.3 kg)  12/18/16 219 lb (99.3 kg)  11/23/16 219 lb 3.2 oz (99.4 kg)

## 2017-03-04 NOTE — Progress Notes (Addendum)
Patient's Name: Taylor [name]  MRN: [id number]  Referring Provider: [name], MD  DOB: 07-24-1932  PCP: [name], MD  DOS: 03/04/2017  Note by: Vevelyn Francois NP  Service setting: Ambulatory outpatient  Specialty: Interventional Pain Management  Location: [facility] (AMB) Pain Management Facility    Patient type: Established    Primary Reason(s) for Visit: Encounter for prescription drug management. (Level of risk: moderate)  CC: Hip Pain (right); Knee Pain (right); and Hand Pain (bilateral)  HPI  Taylor [name] is a 84 y.o. year old, female patient, who comes today for a medication management evaluation. She has Lump or mass in breast; Umbilical hernia; Osteoarthritis, multiple sites; Type 2 diabetes mellitus with microalbuminuria ([place]); Glaucoma; BP (high blood pressure); Chronic pain; Tinea corporis; Calculus of kidney; Vaginal dryness; Dyslipidemia associated with type 2 diabetes mellitus ([place]); Chronic midline low back pain without sciatica; Chronic pain of right knee; Long term current use of opiate analgesic; Primary open angle glaucoma (POAG) of both eyes, severe stage; Hypertensive retinopathy; Vitamin D deficiency; Anemia, unspecified; Chronic pain syndrome; Aortic atherosclerosis ([place]); Anterolisthesis; and Iron deficiency anemia on their problem list. Her primarily concern today is the Hip Pain (right); Knee Pain (right); and Hand Pain (bilateral)  Pain Assessment: Location: Right, Left Knee Onset: More than a month ago Duration: Chronic pain Quality: Pressure Severity: 7 /10 (self-reported pain score)  Note: Reported level is compatible with observation. Clinically the patient looks like a 2/10 A 2/10 is viewed as "Mild to Moderate" and described as noticeable and distracting. Impossible to hide from other people. More frequent flare-ups. Still possible to adapt and function close to normal. It can be very annoying and may have occasional stronger flare-ups. With discipline,  patients may get used to it and adapt.         Taylor [name] was last scheduled for an appointment on 01/07/2017 for medication management. During today's appointment we reviewed Taylor [name]'s chronic pain status, as well as her outpatient medication regimen. She admits that her knee pain continues. She has failed interventional therapy in the past. She is not interested. She continues with home PT. She denies any side effects of her medication.  The patient  reports that she does not use drugs. Her body mass index is 40.62 kg/m.  Further details on both, my assessment(s), as well as the proposed treatment plan, please see below.  Controlled Substance Pharmacotherapy Assessment REMS (Risk Evaluation and Mitigation Strategy)  Analgesic:Oxycodone/acetaminophen 7.5/325 mg 3times daily MME/day:22.5 mg/day. Landis Martins, RN  03/04/2017  1:38 PM  Signed Nursing Pain Medication Assessment:  Safety precautions to be maintained throughout the outpatient stay will include: orient to surroundings, keep bed in low position, maintain call bell within reach at all times, provide assistance with transfer out of bed and ambulation.  Medication Inspection Compliance: Pill count conducted under aseptic conditions, in front of the patient. Neither the pills nor the bottle was removed from the patient's sight at any time. Once count was completed pills were immediately returned to the patient in their original bottle.  Medication: Oxycodone/APAP Pill/Patch Count: 23 of 90 pills remain Pill/Patch Appearance: Markings consistent with prescribed medication Bottle Appearance: Standard pharmacy container. Clearly labeled. Filled Date: 12/21 2018 Last Medication intake:  Today   Pharmacokinetics: Liberation and absorption (onset of action): WNL Distribution (time to peak effect): WNL Metabolism and excretion (duration of action): WNL         Pharmacodynamics: Desired effects: Analgesia: Taylor [name] reports  >50% benefit.  Functional ability: Patient reports that medication allows her to accomplish basic ADLs Clinically meaningful improvement in function (CMIF): Sustained CMIF goals met Perceived effectiveness: Described as relatively effective, allowing for increase in activities of daily living (ADL) Undesirable effects: Side-effects or Adverse reactions: None reported Monitoring: [place] PMP: Online review of the past 8-monthperiod conducted. Compliant with practice rules and regulations Last UDS on record: Summary  Date Value Ref Range Status  09/13/2016 FINAL  Final    Comment:    ==================================================================== TOXASSURE SELECT 13 (MW) ==================================================================== Test                             Result       Flag       Units Drug Present and Declared for Prescription Verification   Oxycodone                      175          EXPECTED   ng/mg creat   Oxymorphone                    591          EXPECTED   ng/mg creat   Noroxycodone                   644          EXPECTED   ng/mg creat   Noroxymorphone                 234          EXPECTED   ng/mg creat    Sources of oxycodone are scheduled prescription medications.    Oxymorphone, noroxycodone, and noroxymorphone are expected    metabolites of oxycodone. Oxymorphone is also available as a    scheduled prescription medication. ==================================================================== Test                      Result    Flag   Units      Ref Range   Creatinine              125              mg/dL      >=20 ==================================================================== Declared Medications:  The flagging and interpretation on this report are based on the  following declared medications.  Unexpected results may arise from  inaccuracies in the declared medications.  **Note: The testing scope of this panel includes these medications:  Oxycodone  (Percocet)  **Note: The testing scope of this panel does not include following  reported medications:  Acetaminophen (Percocet)  Acetaminophen (Tylenol)  Alendronate (Fosamax)  Amlodipine (Norvasc)  Bimatoprost (Lumigan)  Cholecalciferol  Cyclobenzaprine (Flexeril)  Estrogen (Premarin)  Fluticasone (Flonase)  Ketoconazole (Nizoral)  Lidocaine (Xylocaine)  Metformin  Omega-3 Fatty Acids (Lovaza)  Pravastatin (Pravachol)  Quinapril (Accupril)  Ranitidine (Zantac)  Vitamin D2 (Drisdol) ==================================================================== For clinical consultation, please call ([phone] ====================================================================    UDS interpretation: Compliant          Medication Assessment Form: Reviewed. Patient indicates being compliant with therapy Treatment compliance: Compliant Risk Assessment Profile: Aberrant behavior: See prior evaluations. None observed or detected today Comorbid factors increasing risk of overdose: See prior notes. No additional risks detected today Risk of substance use disorder (SUD): Low Opioid Risk Tool - 01/07/17 1417      Family History of Substance Abuse  Alcohol  Negative    Illegal Drugs  Negative    Rx Drugs  Negative      Personal History of Substance Abuse   Alcohol  Negative    Illegal Drugs  Negative    Rx Drugs  Negative      Age   Age between 83-45 years   No      Psychological Disease   Psychological Disease  Negative    Depression  Negative      Total Score   Opioid Risk Tool Scoring  0    Opioid Risk Interpretation  Low Risk      ORT Scoring interpretation table:  Score <3 = Low Risk for SUD  Score between 4-7 = Moderate Risk for SUD  Score >8 = High Risk for Opioid Abuse   Risk Mitigation Strategies:  Patient Counseling: Covered Patient-Prescriber Agreement (PPA): Present and active  Notification to other healthcare providers: Done  Pharmacologic Plan: No  change in therapy, at this time.             Laboratory Chemistry  Inflammation Markers (CRP: Acute Phase) (ESR: Chronic Phase) No results found for: CRP, ESRSEDRATE, LATICACIDVEN               Rheumatology Markers No results found for: RF, ANA, [facility], LYMEIGGIGMAB, [facility] (1-Rh)              Renal Function Markers Lab Results  Component Value Date   BUN 19 08/14/2016   CREATININE 1.26 (H) 08/14/2016   GFRAA 46 (L) 08/14/2016   GFRNONAA 40 (L) 08/14/2016                 Hepatic Function Markers Lab Results  Component Value Date   AST 9 (L) 08/14/2016   ALT 6 08/14/2016   ALBUMIN 4.0 08/14/2016   ALKPHOS 56 08/14/2016                 Electrolytes Lab Results  Component Value Date   NA 140 08/14/2016   K 5.3 08/14/2016   CL 105 08/14/2016   CALCIUM 9.6 08/14/2016                 Neuropathy Markers Lab Results  Component Value Date   HGBA1C 7.1 12/18/2016                 Bone Pathology Markers Lab Results  Component Value Date   VD25OH 16 (L) 08/14/2016                 Coagulation Parameters Lab Results  Component Value Date   PLT 331 08/14/2016                 Cardiovascular Markers Lab Results  Component Value Date   HGB 11.2 (L) 12/18/2016   HCT 34.2 (L) 12/18/2016                 CA Markers No results found for: CEA, CA125, LABCA2               Note: Lab results reviewed.  Recent Diagnostic Imaging Results  US BREAST LTD UNI RIGHT INC AXILLA CLINICAL DATA:  The patient was called back for a right breast mass  EXAM: DIGITAL DIAGNOSTIC RIGHT MAMMOGRAM  ULTRASOUND RIGHT BREAST  COMPARISON:  Previous exam(s).  ACR Breast Density Category b: There are scattered areas of fibroglandular density.  FINDINGS: The right breast mass persists on additional imaging.  On physical exam, no suspicious  lumps are identified.  Targeted ultrasound is performed, showing fibrocystic changes accounting for the right breast  mass.  IMPRESSION: No mammographic or sonographic evidence of malignancy.  RECOMMENDATION: Annual screening mammography.  I have discussed the findings and recommendations with the patient. Results were also provided in writing at the conclusion of the visit. If applicable, a reminder letter will be sent to the patient regarding the next appointment.  BI-RADS CATEGORY  2: Benign.  Electronically Signed   By: Dorise Bullion III M.D   On: 08/09/2016 12:38 MM DIAG BREAST TOMO UNI RIGHT CLINICAL DATA:  The patient was called back for a right breast mass  EXAM: DIGITAL DIAGNOSTIC RIGHT MAMMOGRAM  ULTRASOUND RIGHT BREAST  COMPARISON:  Previous exam(s).  ACR Breast Density Category b: There are scattered areas of fibroglandular density.  FINDINGS: The right breast mass persists on additional imaging.  On physical exam, no suspicious lumps are identified.  Targeted ultrasound is performed, showing fibrocystic changes accounting for the right breast mass.  IMPRESSION: No mammographic or sonographic evidence of malignancy.  RECOMMENDATION: Annual screening mammography.  I have discussed the findings and recommendations with the patient. Results were also provided in writing at the conclusion of the visit. If applicable, a reminder letter will be sent to the patient regarding the next appointment.  BI-RADS CATEGORY  2: Benign.  Electronically Signed   By: Dorise Bullion III M.D   On: 08/09/2016 12:38  Complexity Note: Imaging results reviewed. Results shared with Taylor [name], using [employer].                         Meds   Current Outpatient Medications:  .  acetaminophen (TYLENOL) 500 MG tablet, Take 1 tablet (500 mg total) by mouth 3 (three) times daily. (Patient taking differently: Take 500 mg by mouth as needed. ), Disp: 90 tablet, Rfl: 0 .  alendronate (FOSAMAX) 70 MG tablet, TAKE 1 TABLET WEEKLY, Disp: 12 tablet, Rfl: 1 .  amLODipine (NORVASC) 10 MG  tablet, Take 1 tablet (10 mg total) by mouth daily., Disp: 90 tablet, Rfl: 1 .  BESIVANCE 0.6 % SUSP, 1 DROP IN SURGICAL EYE TWICE A DAY STARTING 1 DAY BEFORE SURGERY, Disp: , Rfl: 1 .  bimatoprost (LUMIGAN) 0.01 % SOLN, Place 1 drop into both eyes daily. , Disp: , Rfl:  .  brimonidine-timolol (COMBIGAN) 0.2-0.5 % ophthalmic solution, Place 1 drop into both eyes See admin instructions. 1 drop into both eyes in the morning, then 1 drop into both eyes 8 hours later, Disp: , Rfl:  .  Cholecalciferol (VITAMIN D-1000 MAX ST) 1000 UNITS tablet, Take 1,000 Units by mouth daily. Reported on 08/05/2015, Disp: , Rfl:  .  conjugated estrogens (PREMARIN) vaginal cream, Place 1 Applicatorful vaginally daily., Disp: 42.5 g, Rfl: 5 .  fluticasone (FLONASE) 50 MCG/ACT nasal spray, Place 2 sprays into both nostrils daily., Disp: 16 g, Rfl: 1 .  glucose blood test strip, Use as instructed, Disp: 100 each, Rfl: 12 .  ketoconazole (NIZORAL) 2 % cream, Apply 1 application topically daily., Disp: 15 g, Rfl: 0 .  lidocaine (XYLOCAINE) 5 % ointment, Apply 1 application topically 3 (three) times daily as needed. 2 grams, Disp: 35.44 g, Rfl: 0 .  metFORMIN (GLUCOPHAGE) 500 MG tablet, Take 1 tablet (500 mg total) by mouth 2 (two) times daily with a meal., Disp: 180 tablet, Rfl: 1 .  omega-3 acid ethyl esters (LOVAZA) 1 g capsule, Take 1 capsule (1  g total) by mouth 2 (two) times daily., Disp: 180 capsule, Rfl: 1 .  ONETOUCH DELICA LANCETS FINE MISC, 1 each by Does not apply route daily. 30 G Lancets- One Touch Delica, Disp: 638 each, Rfl: 1 .  pravastatin (PRAVACHOL) 40 MG tablet, TAKE 1 TABLET (40 MG TOTAL) BY MOUTH AT BEDTIME., Disp: 90 tablet, Rfl: 1 .  quinapril (ACCUPRIL) 40 MG tablet, TAKE 1 TABLET (40 MG TOTAL) BY MOUTH DAILY., Disp: 90 tablet, Rfl: 1 .  ranitidine (ZANTAC) 150 MG tablet, TAKE 1 TABLET BY MOUTH DAILY AS NEEDED FOR HEARTBURN, *TAKE THIS IN PLACCE OF OMEPRAZOLE*, Disp: 90 tablet, Rfl: 1 .  Vitamin D,  Ergocalciferol, (DRISDOL) 50000 units CAPS capsule, Take 1 capsule (50,000 Units total) by mouth every 7 (seven) days., Disp: 12 capsule, Rfl: 0 .  [START ON 05/07/2017] oxyCODONE-acetaminophen (PERCOCET) 7.5-325 MG tablet, Take 1 tablet by mouth every 8 (eight) hours as needed for moderate pain or severe pain., Disp: 90 tablet, Rfl: 0 .  [START ON 04/07/2017] oxyCODONE-acetaminophen (PERCOCET) 7.5-325 MG tablet, Take 1 tablet by mouth every 8 (eight) hours as needed for moderate pain or severe pain., Disp: 90 tablet, Rfl: 0  ROS  Constitutional: Denies any fever or chills Gastrointestinal: No reported hemesis, hematochezia, vomiting, or acute GI distress Musculoskeletal: Denies any acute onset joint swelling, redness, loss of ROM, or weakness Neurological: No reported episodes of acute onset apraxia, aphasia, dysarthria, agnosia, amnesia, paralysis, loss of coordination, or loss of consciousness  Allergies  Taylor [name] has No Known Allergies.  [place]  Drug: Taylor [name]  reports that she does not use drugs. Alcohol:  reports that she does not drink alcohol. Tobacco:  reports that  has never smoked. she has never used smokeless tobacco. Medical:  has a past medical history of Abnormal mammogram, unspecified (2013), Breast screening, unspecified (2013), Diabetes mellitus without complication ([place]) (9373), GERD (gastroesophageal reflux disease), Glaucoma (2003), Gout, Hyperlipidemia (2008), Hypertension (1980's), Lump or mass in breast (01/03/2012), Obesity, unspecified (2013), Osteoporosis, Shingles (2013), and Special screening for malignant neoplasms, colon (2013). Surgical: Taylor [name]  has a past surgical history that includes Abdominal hysterectomy (1958); Knee surgery (2009,2011,2012); Lipoma excision (1998); Spine surgery (2004); Tonsillectomy; Colonoscopy (2003); Coronary artery bypass graft (Left, 01/03/2012,01/24/2012); Cataract extraction, bilateral (Bilateral); Cholecystectomy; Back surgery; and  Knee surgery. Family: family history includes Cancer in her mother; Heart disease in her father; Stroke in her mother.  Constitutional Exam  General appearance: Well nourished, well developed, and well hydrated. In no apparent acute distress Vitals:   03/04/17 1311  BP: (!) 143/53  Pulse: 74  Resp: 16  Temp: 98.2 F (36.8 C)  TempSrc: Oral  SpO2: 99%  Weight: 215 lb (97.5 kg)  Height: 5' 1"  (1.549 m)  Psych/Mental status: Alert, oriented x 3 (person, place, & time)       Eyes: PERLA Respiratory: No evidence of acute respiratory distress  Lumbar Spine Area Exam  Skin & Axial Inspection: No masses, redness, or swelling Alignment: Symmetrical Functional ROM: Unrestricted ROM      Stability: No instability detected Muscle Tone/Strength: Functionally intact. No obvious neuro-muscular anomalies detected. Sensory (Neurological): Unimpaired Palpation: No palpable anomalies       Provocative Tests: Lumbar Hyperextension and rotation test: evaluation deferred today       Lumbar Lateral bending test: evaluation deferred today       Patrick's Maneuver: evaluation deferred today  Gait & Posture Assessment  Ambulation: Patient ambulates using a walker Gait: Relatively normal for age and body habitus Posture: WNL   Lower Extremity Exam    Side: Right lower extremity  Side: Left lower extremity  Skin & Extremity Inspection: Skin color, temperature, and hair growth are WNL. No peripheral edema or cyanosis. No masses, redness, swelling, asymmetry, or associated skin lesions. No contractures.  Skin & Extremity Inspection: Skin color, temperature, and hair growth are WNL. No peripheral edema or cyanosis. No masses, redness, swelling, asymmetry, or associated skin lesions. No contractures.  Functional ROM: Unrestricted ROM          Functional ROM: Unrestricted ROM          Muscle Tone/Strength: Functionally intact. No obvious neuro-muscular anomalies detected.  Muscle  Tone/Strength: Functionally intact. No obvious neuro-muscular anomalies detected.  Sensory (Neurological): Unimpaired  Sensory (Neurological): Unimpaired  Palpation: No palpable anomalies  Palpation: No palpable anomalies   Assessment  Primary Diagnosis & Pertinent Problem List: The primary encounter diagnosis was Chronic pain of right knee. Diagnoses of Chronic midline low back pain without sciatica, Osteoarthritis of multiple joints, unspecified osteoarthritis type, and Chronic pain syndrome were also pertinent to this visit.  Status Diagnosis  Controlled Persistent Persistent 1. Chronic pain of right knee   2. Chronic midline low back pain without sciatica   3. Osteoarthritis of multiple joints, unspecified osteoarthritis type   4. Chronic pain syndrome     Problems updated and reviewed during this visit: No problems updated. Plan of Care  Pharmacotherapy (Medications Ordered): Meds ordered this encounter  Medications  . DISCONTD: oxyCODONE-acetaminophen (PERCOCET) 7.5-325 MG tablet    Sig: Take 1 tablet by mouth every 6 (six) hours as needed for moderate pain or severe pain.    Dispense:  90 tablet    Refill:  0    Do not place this medication, or any other prescription from our practice, on "Automatic Refill". Patient may have prescription filled one day early if pharmacy is closed on scheduled refill date. Do not fill until:05/07/2017 To last until:06/06/2017    Order Specific Question:   Supervising Provider    Answer:   Milinda Pointer [phone]  . DISCONTD: oxyCODONE-acetaminophen (PERCOCET) 7.5-325 MG tablet    Sig: Take 1 tablet by mouth every 6 (six) hours as needed for moderate pain or severe pain.    Dispense:  90 tablet    Refill:  0    Do not place this medication, or any other prescription from our practice, on "Automatic Refill". Patient may have prescription filled one day early if pharmacy is closed on scheduled refill date. Do not fill until: 04/07/2017 To last  until:05/07/2017    Order Specific Question:   Supervising Provider    Answer:   Milinda Pointer [phone]  . oxyCODONE-acetaminophen (PERCOCET) 7.5-325 MG tablet    Sig: Take 1 tablet by mouth every 8 (eight) hours as needed for moderate pain or severe pain.    Dispense:  90 tablet    Refill:  0    Do not place this medication, or any other prescription from our practice, on "Automatic Refill". Patient may have prescription filled one day early if pharmacy is closed on scheduled refill date. Do not fill until: :05/07/2017 To last until4/18/2019    Order Specific Question:   Supervising Provider    Answer:   Milinda Pointer [phone]  . oxyCODONE-acetaminophen (PERCOCET) 7.5-325 MG tablet    Sig: Take 1 tablet by mouth every 8 (eight)  hours as needed for moderate pain or severe pain.    Dispense:  90 tablet    Refill:  0    Do not place this medication, or any other prescription from our practice, on "Automatic Refill". Patient may have prescription filled one day early if pharmacy is closed on scheduled refill date. Do not fill until: 2/17/2019To last until:05/07/2017    Order Specific Question:   Supervising Provider    Answer:   Milinda Pointer [phone]   New Prescriptions   OXYCODONE-ACETAMINOPHEN (PERCOCET) 7.5-325 MG TABLET    Take 1 tablet by mouth every 8 (eight) hours as needed for moderate pain or severe pain.   OXYCODONE-ACETAMINOPHEN (PERCOCET) 7.5-325 MG TABLET    Take 1 tablet by mouth every 8 (eight) hours as needed for moderate pain or severe pain.   Medications administered today: Taylor [name] had no medications administered during this visit. Lab-work, procedure(s), and/or referral(s): No orders of the defined types were placed in this encounter.  Imaging and/or referral(s): None  Interventional therapies: Planned, scheduled, and/or pending:   Not at this time.  Provider-requested follow-up: Return in about 3 months (around 06/02/2017) for MedMgmt with Me  Donella Stade [facility]).  Future Appointments  Date Time Provider [place]  04/19/2017 10:40 AM [name], MD [facility]  05/27/2017  1:45 PM Vevelyn Francois, NP [facility] None  06/03/2017  1:45 PM Hayden Pedro, MD [facility] None   Primary Care Physician: [name], MD Location: [facility] Note by: Vevelyn Francois NP Date: 03/04/2017; Time: 2:21 PM  Pain Score Disclaimer: We use the NRS-11 scale. This is a self-reported, subjective measurement of pain severity with only modest accuracy. It is used primarily to identify changes within a particular patient. It must be understood that outpatient pain scales are significantly less accurate that those used for research, where they can be applied under ideal controlled circumstances with minimal exposure to variables. In reality, the score is likely to be a combination of pain intensity and pain affect, where pain affect describes the degree of emotional arousal or changes in action readiness caused by the sensory experience of pain. Factors such as social and work situation, setting, emotional state, anxiety levels, expectation, and prior pain experience may influence pain perception and show large inter-individual differences that may also be affected by time variables.  Patient instructions provided during this appointment: Patient Instructions    ____________________________________________________________________________________________  Medication Rules  Applies to: All patients receiving prescriptions (written or electronic).  Pharmacy of record: Pharmacy where electronic prescriptions will be sent. If written prescriptions are taken to a different pharmacy, please inform the nursing staff. The pharmacy listed in the electronic medical record should be the one where you would like electronic prescriptions to be sent.  Prescription refills: Only during scheduled appointments. Applies to both,  written and electronic prescriptions.  NOTE: The following applies primarily to controlled substances (Opioid* Pain Medications).   Patient's responsibilities: 1. Pain Pills: Bring all pain pills to every appointment (except for procedure appointments). 2. Pill Bottles: Bring pills in original pharmacy bottle. Always bring newest bottle. Bring bottle, even if empty. 3. Medication refills: You are responsible for knowing and keeping track of what medications you need refilled. The day before your appointment, write a list of all prescriptions that need to be refilled. Bring that list to your appointment and give it to the admitting nurse. Prescriptions will be written only during appointments. If you forget a medication, it will  not be "Called in", "Faxed", or "electronically sent". You will need to get another appointment to get these prescribed. 4. Prescription Accuracy: You are responsible for carefully inspecting your prescriptions before leaving our office. Have the discharge nurse carefully go over each prescription with you, before taking them home. Make sure that your name is accurately spelled, that your address is correct. Check the name and dose of your medication to make sure it is accurate. Check the number of pills, and the written instructions to make sure they are clear and accurate. Make sure that you are given enough medication to last until your next medication refill appointment. 5. Taking Medication: Take medication as prescribed. Never take more pills than instructed. Never take medication more frequently than prescribed. Taking less pills or less frequently is permitted and encouraged, when it comes to controlled substances (written prescriptions).  6. Inform other Doctors: Always inform, all of your healthcare providers, of all the medications you take. 7. Pain Medication from other Providers: You are not allowed to accept any additional pain medication from any other Doctor or  Healthcare provider. There are two exceptions to this rule. (see below) In the event that you require additional pain medication, you are responsible for notifying us, as stated below. 8. Medication Agreement: You are responsible for carefully reading and following our Medication Agreement. This must be signed before receiving any prescriptions from our practice. Safely store a copy of your signed Agreement. Violations to the Agreement will result in no further prescriptions. (Additional copies of our Medication Agreement are available upon request.) 9. Laws, Rules, & Regulations: All patients are expected to follow all Federal and [employer], [employer], Rules, [employer] [facility]. Ignorance of the Laws does not constitute a valid excuse. The use of any illegal substances is prohibited. 10. Adopted CDC guidelines & recommendations: Target dosing levels will be at or below 60 MME/day. Use of benzodiazepines** is not recommended.  Exceptions: There are only two exceptions to the rule of not receiving pain medications from other Healthcare Providers. 1. Exception #1 (Emergencies): In the event of an emergency (i.e.: accident requiring emergency care), you are allowed to receive additional pain medication. However, you are responsible for: As soon as you are able, call our office [phone], at any time of the day or night, and leave a message stating your name, the date and nature of the emergency, and the name and dose of the medication prescribed. In the event that your call is answered by a member of our staff, make sure to document and save the date, time, and the name of the person that took your information.  2. Exception #2 (Planned Surgery): In the event that you are scheduled by another doctor or dentist to have any type of surgery or procedure, you are allowed (for a period no longer than 30 days), to receive additional pain medication, for the acute post-op pain. However, in this case, you are  responsible for picking up a copy of our "Post-op Pain Management for Surgeons" handout, and giving it to your surgeon or dentist. This document is available at our office, and does not require an appointment to obtain it. Simply go to our office during business hours (Monday-Thursday from 8:00 AM to 4:00 PM) (Friday 8:00 AM to 12:00 Noon) or if you have a scheduled appointment with [place], prior to your surgery, and ask for it by name. In addition, you will need to provide [place] with your name, name of your surgeon, type of  surgery, and date of procedure or surgery.  *Opioid medications include: morphine, codeine, oxycodone, oxymorphone, hydrocodone, hydromorphone, meperidine, tramadol, tapentadol, buprenorphine, fentanyl, methadone. **Benzodiazepine medications include: diazepam (Valium), alprazolam (Xanax), clonazepam (Klonopine), lorazepam (Ativan), clorazepate (Tranxene), chlordiazepoxide (Librium), estazolam (Prosom), oxazepam (Serax), temazepam (Restoril), triazolam (Halcion)  ____________________________________________________________________________________________   BMI Assessment: Estimated body mass index is 40.62 kg/m as calculated from the following:   Height as of this encounter: 5' 1"  (1.549 m).   Weight as of this encounter: 215 lb (97.5 kg).  BMI interpretation table: BMI level Category Range association with higher incidence of chronic pain  <18 kg/m2 Underweight   18.5-24.9 kg/m2 Ideal body weight   25-29.9 kg/m2 Overweight Increased incidence by 20%  30-34.9 kg/m2 Obese (Class I) Increased incidence by 68%  35-39.9 kg/m2 Severe obesity (Class II) Increased incidence by 136%  >40 kg/m2 Extreme obesity (Class III) Increased incidence by 254%   BMI Readings from Last 4 Encounters:  03/04/17 40.62 kg/m  01/07/17 41.38 kg/m  12/18/16 41.38 kg/m  11/23/16 41.42 kg/m   Wt Readings from Last 4 Encounters:  03/04/17 215 lb (97.5 kg)  01/07/17 219 lb (99.3 kg)  12/18/16 219 lb  (99.3 kg)  11/23/16 219 lb 3.2 oz (99.4 kg)

## 2017-03-16 ENCOUNTER — Other Ambulatory Visit: Payer: Self-pay | Admitting: Family Medicine

## 2017-03-16 DIAGNOSIS — M810 Age-related osteoporosis without current pathological fracture: Secondary | ICD-10-CM

## 2017-03-18 ENCOUNTER — Telehealth: Payer: Self-pay | Admitting: Family Medicine

## 2017-03-18 ENCOUNTER — Ambulatory Visit: Payer: Self-pay | Admitting: *Deleted

## 2017-03-18 NOTE — Telephone Encounter (Signed)
Copied from [place] [id number]. Topic: Quick Communication - See Telephone Encounter >> Mar 18, 2017  2:59 PM Boyd Kerbs wrote: CRM for notification. See Telephone encounter for:   Pt is asking for a prescription refill for Benzonatate.  Has not been filled since 3/18. She has a dry cough  [id number] - [place], [place] - [address] [place] [address] Phone: [phone] Fax: [phone]    03/18/17.

## 2017-03-18 NOTE — Telephone Encounter (Signed)
Taylor [name] called to inform patient she needs an appointment for cough but her voicemail was full and unable to leave message. Orvis Brill put in CRM for them to schedule an appointment when she calls back.

## 2017-03-18 NOTE — Telephone Encounter (Signed)
See phone triage note.

## 2017-03-18 NOTE — Telephone Encounter (Signed)
She needs to try otc or come in for evaluation of cough. I am sorry

## 2017-03-18 NOTE — Telephone Encounter (Signed)
Patient is requesting tessalon for her cough. Patient reports she has a dry cough that has lingered for 2 weeks now. She does not feel that she has congestion in her chest and she does not reports problems breathing. She states she has some nasal congestion that is helped with Flonase. Patient states she has her worst coughing at night when she lays down to go to sleep. Patient states she "barks". Home care reviewed and patient advised to continue her OTC treatment. If her symptoms continue for another week- she should be seen.Will forward her request to her provider.   Reason for Disposition . Cough with cold symptoms (e.g., runny nose, postnasal drip, throat clearing)  Answer Assessment - Initial Assessment Questions 1. ONSET: "When did the cough begin?"      Couple weeks  2. SEVERITY: "How bad is the cough today?"      Not too bad today- tonight it may get worse 3. RESPIRATORY DISTRESS: "Describe your breathing."      No problems with breathing 4. FEVER: "Do you have a fever?" If so, ask: "What is your temperature, how was it measured, and when did it start?"     No fever 5. HEMOPTYSIS: "Are you coughing up any blood?" If so ask: "How much?" (flecks, streaks, tablespoons, etc.)     no 6. TREATMENT: "What have you done so far to treat the cough?" (e.g., meds, fluids, humidifier)     Using Desylm, fluids 7. CARDIAC HISTORY: "Do you have any history of heart disease?" (e.g., heart attack, congestive heart failure)      no 8. LUNG HISTORY: "Do you have any history of lung disease?"  (e.g., pulmonary embolus, asthma, emphysema)     no 9. PE RISK FACTORS: "Do you have a history of blood clots?" (or: recent major surgery, recent prolonged travel, bedridden )     no 10. OTHER SYMPTOMS: "Do you have any other symptoms? (e.g., runny nose, wheezing, chest pain)       Nasal congestion- stopped up 11. PREGNANCY: "Is there any chance you are pregnant?" "When was your last menstrual period?"        n/a 12. TRAVEL: "Have you traveled out of the country in the last month?" (e.g., travel history, exposures)       n/a  Protocols used: COUGH - ACUTE NON-PRODUCTIVE-A-AH

## 2017-03-20 ENCOUNTER — Ambulatory Visit: Payer: Medicare Other | Admitting: Family Medicine

## 2017-03-21 ENCOUNTER — Ambulatory Visit (INDEPENDENT_AMBULATORY_CARE_PROVIDER_SITE_OTHER): Payer: Medicare Other | Admitting: Family Medicine

## 2017-03-21 ENCOUNTER — Encounter: Payer: Self-pay | Admitting: Family Medicine

## 2017-03-21 ENCOUNTER — Ambulatory Visit
Admission: RE | Admit: 2017-03-21 | Discharge: 2017-03-21 | Disposition: A | Discharge status: Home / Self Care | Payer: Medicare Other | Source: Ambulatory Visit | Attending: Family Medicine | Admitting: Family Medicine

## 2017-03-21 VITALS — BP 132/72 | HR 76 | Temp 98.7°F | Resp 16 | Ht 61.0 in | Wt 212.5 lb

## 2017-03-21 DIAGNOSIS — R05 Cough: Secondary | ICD-10-CM

## 2017-03-21 DIAGNOSIS — E1129 Type 2 diabetes mellitus with other diabetic kidney complication: Secondary | ICD-10-CM

## 2017-03-21 DIAGNOSIS — R809 Proteinuria, unspecified: Secondary | ICD-10-CM

## 2017-03-21 DIAGNOSIS — R059 Cough, unspecified: Secondary | ICD-10-CM

## 2017-03-21 DIAGNOSIS — R062 Wheezing: Secondary | ICD-10-CM

## 2017-03-21 DIAGNOSIS — M4855XA Collapsed vertebra, not elsewhere classified, thoracolumbar region, initial encounter for fracture: Secondary | ICD-10-CM | POA: Insufficient documentation

## 2017-03-21 DIAGNOSIS — Z748 Other problems related to care provider dependency: Secondary | ICD-10-CM | POA: Diagnosis not present

## 2017-03-21 LAB — BASIC METABOLIC PANEL WITH GFR
BUN/Creatinine Ratio: 18 (calc) (ref 6–22)
BUN: 23 mg/dL (ref 7–25)
CO2: 24 mmol/L (ref 20–32)
Calcium: 9.3 mg/dL (ref 8.6–10.4)
Chloride: 105 mmol/L (ref 98–110)
Creat: 1.26 mg/dL — ABNORMAL HIGH (ref 0.60–0.88)
GFR, Est African American: 46 mL/min/{1.73_m2} — ABNORMAL LOW (ref >=60)
GFR, Est Non African American: 40 mL/min/{1.73_m2} — ABNORMAL LOW (ref >=60)
Glucose, Bld: 143 mg/dL — ABNORMAL HIGH (ref 65–99)
Potassium: 4.4 mmol/L (ref 3.5–5.3)
Sodium: 139 mmol/L (ref 135–146)

## 2017-03-21 LAB — CBC
HCT: 35.1 % (ref 35.0–45.0)
Hemoglobin: 11.4 g/dL — ABNORMAL LOW (ref 11.7–15.5)
MCH: 27.1 pg (ref 27.0–33.0)
MCHC: 32.5 g/dL (ref 32.0–36.0)
MCV: 83.4 fL (ref 80.0–100.0)
MPV: 9.9 fL (ref 7.5–12.5)
Platelets: 346 10*3/uL (ref 140–400)
RBC: 4.21 10*6/uL (ref 3.80–5.10)
RDW: 13.2 % (ref 11.0–15.0)
WBC: 6.9 10*3/uL (ref 3.8–10.8)

## 2017-03-21 MED ORDER — BENZONATATE 100 MG PO CAPS: 100.0000 mg | ORAL_CAPSULE | Freq: Three times a day (TID) | ORAL | 0 refills | Status: DC | PRN

## 2017-03-21 NOTE — Progress Notes (Addendum)
Name: Taylor [name]   MRN: [id number]    DOB: 03/15/1932   Date:03/21/2017       Progress Note  Subjective  Chief Complaint  Chief Complaint  Patient presents with  . Cough    for over 2 week, non productive, worst at night    HPI  Pt presents with 2 weeks of dry non-productive cough.  She has never been a smoker; has diabetes (last A1C was 7.1%, taking her metformin as prescribed).  She notes some nasal congestion - better with Flonase; endorses occasional chills and hoarse voice.  Denies chest pain, shortness of breath, fevers, body aches, NVD, no history of lung issues in the past, no appetite changes, no fatigue, no recent sick contacts, no regurgitation or heartburn.    She missed an appointment yesterday with PCP Dr. Ancil Boozer - she has not been driving since she had a fall, so she is now dependent on her family and friends for a ride to her appointments.  She states she has the resources that she needs including someone to take her to the grocery store and other ADL's.  Patient Active Problem List   Diagnosis Date Noted  . Iron deficiency anemia 12/19/2016  . Aortic atherosclerosis ([place]) 11/23/2016  . Anterolisthesis 11/23/2016  . Chronic pain syndrome 09/13/2016  . Vitamin D deficiency 08/19/2016  . Anemia, unspecified 08/19/2016  . Primary open angle glaucoma (POAG) of both eyes, severe stage 08/17/2016  . Hypertensive retinopathy 08/17/2016  . Long term current use of opiate analgesic 07/19/2016  . Dyslipidemia associated with type 2 diabetes mellitus ([place]) 04/16/2016  . Chronic midline low back pain without sciatica 04/16/2016  . Chronic pain of right knee 04/16/2016  . Vaginal dryness 02/22/2015  . Calculus of kidney 11/10/2014  . Osteoarthritis, multiple sites 09/09/2014  . Type 2 diabetes mellitus with microalbuminuria ([place]) 09/09/2014  . Glaucoma 09/09/2014  . BP (high blood pressure) 09/09/2014  . Chronic pain 09/09/2014  . Tinea corporis 09/09/2014  . Umbilical  hernia 70/62/3762  . Lump or mass in breast     Social History   Tobacco Use  . Smoking status: Never Smoker  . Smokeless tobacco: Never Used  Substance Use Topics  . Alcohol use: No    Alcohol/week: 0.0 oz     Current Outpatient Medications:  .  acetaminophen (TYLENOL) 500 MG tablet, Take 1 tablet (500 mg total) by mouth 3 (three) times daily. (Patient taking differently: Take 500 mg by mouth as needed. ), Disp: 90 tablet, Rfl: 0 .  alendronate (FOSAMAX) 70 MG tablet, TAKE 1 TABLET WEEKLY, Disp: 12 tablet, Rfl: 1 .  amLODipine (NORVASC) 10 MG tablet, Take 1 tablet (10 mg total) by mouth daily., Disp: 90 tablet, Rfl: 1 .  BESIVANCE 0.6 % SUSP, 1 DROP IN SURGICAL EYE TWICE A DAY STARTING 1 DAY BEFORE SURGERY, Disp: , Rfl: 1 .  bimatoprost (LUMIGAN) 0.01 % SOLN, Place 1 drop into both eyes daily. , Disp: , Rfl:  .  brimonidine-timolol (COMBIGAN) 0.2-0.5 % ophthalmic solution, Place 1 drop into both eyes See admin instructions. 1 drop into both eyes in the morning, then 1 drop into both eyes 8 hours later, Disp: , Rfl:  .  Cholecalciferol (VITAMIN D-1000 MAX ST) 1000 UNITS tablet, Take 1,000 Units by mouth daily. Reported on 08/05/2015, Disp: , Rfl:  .  conjugated estrogens (PREMARIN) vaginal cream, Place 1 Applicatorful vaginally daily., Disp: 42.5 g, Rfl: 5 .  fluticasone (FLONASE) 50 MCG/ACT nasal spray, Place 2  sprays into both nostrils daily., Disp: 16 g, Rfl: 1 .  glucose blood test strip, Use as instructed, Disp: 100 each, Rfl: 12 .  ketoconazole (NIZORAL) 2 % cream, Apply 1 application topically daily., Disp: 15 g, Rfl: 0 .  lidocaine (XYLOCAINE) 5 % ointment, Apply 1 application topically 3 (three) times daily as needed. 2 grams, Disp: 35.44 g, Rfl: 0 .  metFORMIN (GLUCOPHAGE) 500 MG tablet, Take 1 tablet (500 mg total) by mouth 2 (two) times daily with a meal., Disp: 180 tablet, Rfl: 1 .  omega-3 acid ethyl esters (LOVAZA) 1 g capsule, Take 1 capsule (1 g total) by mouth 2 (two)  times daily., Disp: 180 capsule, Rfl: 1 .  ONETOUCH DELICA LANCETS FINE MISC, 1 each by Does not apply route daily. 30 G Lancets- One Touch Delica, Disp: 696 each, Rfl: 1 .  [START ON 05/07/2017] oxyCODONE-acetaminophen (PERCOCET) 7.5-325 MG tablet, Take 1 tablet by mouth every 8 (eight) hours as needed for moderate pain or severe pain., Disp: 90 tablet, Rfl: 0 .  [START ON 04/07/2017] oxyCODONE-acetaminophen (PERCOCET) 7.5-325 MG tablet, Take 1 tablet by mouth every 8 (eight) hours as needed for moderate pain or severe pain., Disp: 90 tablet, Rfl: 0 .  pravastatin (PRAVACHOL) 40 MG tablet, TAKE 1 TABLET (40 MG TOTAL) BY MOUTH AT BEDTIME., Disp: 90 tablet, Rfl: 1 .  quinapril (ACCUPRIL) 40 MG tablet, TAKE 1 TABLET (40 MG TOTAL) BY MOUTH DAILY., Disp: 90 tablet, Rfl: 1 .  ranitidine (ZANTAC) 150 MG tablet, TAKE 1 TABLET BY MOUTH DAILY AS NEEDED FOR HEARTBURN, *TAKE THIS IN PLACCE OF OMEPRAZOLE*, Disp: 90 tablet, Rfl: 1 .  Vitamin D, Ergocalciferol, (DRISDOL) 50000 units CAPS capsule, TAKE 1 CAPSULE (50,000 UNITS TOTAL) BY MOUTH EVERY 7 (SEVEN) DAYS., Disp: 12 capsule, Rfl: 0  No Known Allergies  ROS  Ten systems reviewed and is negative except as mentioned in HPI  Objective  Vitals:   03/21/17 1331  BP: 132/72  Pulse: 76  Resp: 16  Temp: 98.7 F (37.1 C)  TempSrc: Oral  SpO2: 96%  Weight: 212 lb 8 oz (96.4 kg)  Height: 5\' 1"  (1.549 m)   Body mass index is 40.15 kg/m.  Nursing Note and Vital Signs reviewed.  Physical Exam  Constitutional: Patient appears well-developed and well-nourished. Obese. No distress.  HEENT: head atraumatic, normocephalic, pupils equal and reactive to light, EOM's intact, TM's without erythema or bulging, no maxillary or frontal sinus tenderness , neck supple without lymphadenopathy, oropharynx pink and moist without exudate Cardiovascular: Normal rate, regular rhythm, S1/S2 present.  No murmur or rub heard. No BLE edema. Pulmonary/Chest: Effort normal and  breath sounds inspiratory and expiratory wheezing in BUL, some expiratory wheezing in RML. No respiratory distress or retractions. Psychiatric: Patient has a normal mood and affect. behavior is normal. Judgment and thought content normal.  No results found for this or any previous visit (from the past 72 hour(s)).  Assessment & Plan  1. Cough in adult patient - DG Chest 2 View; Future - CBC - BASIC METABOLIC PANEL WITH GFR - benzonatate (TESSALON) 100 MG capsule; Take 1-2 capsules (100-200 mg total) by mouth 3 (three) times daily as needed for cough.  Dispense: 30 capsule; Refill: 0 - Discussed several possible etiologies - adventitious lung sounds suggest possible bronchitis vs pneumonia vs other respiratory issue; takes quinapril - question ACE-I related cough.  Advised we will obtain labs and CXR to better determine the cause, follow up will be planned based on results.  2. Wheezing on auscultation - DG Chest 2 View; Future - CBC - BASIC METABOLIC PANEL WITH GFR  3. Type 2 diabetes mellitus with microalbuminuria, without long-term current use of insulin (HCC) - Discussed increased risk of infection due to her diabetes.  Continue taking medications as prescribed, monitor for fevers/chills, shortness of breath, chest pain, hyper/hypoglycemic episodes.  4. Assistance needed with transportation - Advised to let us know if she needs to be connected to community resources.  At this time she will continue to use family and friends for transportation and assistance with ADL's.  -Red flags and when to present for emergency care or RTC including fever >101.47F, chest pain, shortness of breath, new/worsening/un-resolving symptoms, reviewed with patient at time of visit. Follow up and care instructions discussed and provided in AVS.

## 2017-03-21 NOTE — Patient Instructions (Addendum)
Please go to the [facility] on [facility] for a Chest [employer] Vaporizer A cool mist vaporizer is a device that releases a cool mist into the air. If you have a cough or a cold, using a vaporizer may help relieve your symptoms. The mist adds moisture to the air, which may help thin your mucus and make it less sticky. When your mucus is thin and less sticky, it easier for you to breathe and to cough up secretions. Do not use a vaporizer if you are allergic to mold. Follow these instructions at home:  Follow the instructions that come with the vaporizer.  Do not use anything other than distilled water in the vaporizer.  Do not run the vaporizer all of the time. Doing that can cause mold or bacteria to grow in the vaporizer.  Clean the vaporizer after each time that you use it.  Clean and dry the vaporizer well before storing it.  Stop using the vaporizer if your breathing symptoms get worse. This information is not intended to replace advice given to you by your health care provider. Make sure you discuss any questions you have with your health care provider. Document Released: 11/03/2003 Document Revised: 08/26/2015 Document Reviewed: 05/07/2015 Elsevier Interactive Patient Education  2018 [place].   Cough, Adult A cough helps to clear your throat and lungs. A cough may last only 2-3 weeks (acute), or it may last longer than 8 weeks (chronic). Many different things can cause a cough. A cough may be a sign of an illness or another medical condition. Follow these instructions at home:  Pay attention to any changes in your cough.  Take medicines only as told by your doctor. ? If you were prescribed an antibiotic medicine, take it as told by your doctor. Do not stop taking it even if you start to feel better. ? Talk with your doctor before you try using a cough medicine.  Drink enough fluid to keep your pee (urine) clear or pale yellow.  If the air is  dry, use a cold steam vaporizer or humidifier in your home.  Stay away from things that make you cough at work or at home.  If your cough is worse at night, try using extra pillows to raise your head up higher while you sleep.  Do not smoke, and try not to be around smoke. If you need help quitting, ask your doctor.  Do not have caffeine.  Do not drink alcohol.  Rest as needed. Contact a doctor if:  You have new problems (symptoms).  You cough up yellow fluid (pus).  Your cough does not get better after 2-3 weeks, or your cough gets worse.  Medicine does not help your cough and you are not sleeping well.  You have pain that gets worse or pain that is not helped with medicine.  You have a fever.  You are losing weight and you do not know why.  You have night sweats. Get help right away if:  You cough up blood.  You have trouble breathing.  Your heartbeat is very fast. This information is not intended to replace advice given to you by your health care provider. Make sure you discuss any questions you have with your health care provider. Document Released: 10/19/2010 Document Revised: 07/14/2015 Document Reviewed: 04/14/2014 Elsevier Interactive Patient Education  [employer].

## 2017-03-22 ENCOUNTER — Telehealth: Payer: Self-pay | Admitting: Family Medicine

## 2017-03-22 ENCOUNTER — Other Ambulatory Visit: Payer: Self-pay | Admitting: Family Medicine

## 2017-03-22 DIAGNOSIS — M8088XA Other osteoporosis with current pathological fracture, vertebra(e), initial encounter for fracture: Secondary | ICD-10-CM

## 2017-03-22 DIAGNOSIS — E559 Vitamin D deficiency, unspecified: Secondary | ICD-10-CM

## 2017-03-22 DIAGNOSIS — I10 Essential (primary) hypertension: Secondary | ICD-10-CM

## 2017-03-22 DIAGNOSIS — M159 Polyosteoarthritis, unspecified: Secondary | ICD-10-CM

## 2017-03-22 MED ORDER — OLMESARTAN MEDOXOMIL 20 MG PO TABS: 20.0000 mg | ORAL_TABLET | Freq: Every day | ORAL | 0 refills | Status: DC

## 2017-03-22 NOTE — Telephone Encounter (Signed)
Okay to give her verbal order

## 2017-03-22 NOTE — Telephone Encounter (Signed)
Gave verbal order on Marlowe Kays from [facility] cell phone for patient to have OT per Dr. Ancil Boozer.

## 2017-03-22 NOTE — Telephone Encounter (Signed)
Copied from [place] [id number]. Topic: Quick Communication - See Telephone Encounter >> Mar 22, 2017 10:43 AM Valla Leaver wrote: CRM for notification. See Telephone encounter for: Connie,OT w/ [facility] requesting verbal order for OT 2x a wk for 3wks, 1x wk for 3wks.  03/22/17.

## 2017-04-03 ENCOUNTER — Encounter: Payer: Self-pay | Admitting: Family Medicine

## 2017-04-03 ENCOUNTER — Ambulatory Visit (INDEPENDENT_AMBULATORY_CARE_PROVIDER_SITE_OTHER): Payer: Medicare Other | Admitting: Family Medicine

## 2017-04-03 ENCOUNTER — Other Ambulatory Visit: Payer: Self-pay | Admitting: Family Medicine

## 2017-04-03 VITALS — BP 140/70 | HR 76 | Resp 14 | Ht 61.0 in | Wt 213.8 lb

## 2017-04-03 DIAGNOSIS — R05 Cough: Secondary | ICD-10-CM

## 2017-04-03 DIAGNOSIS — G8929 Other chronic pain: Secondary | ICD-10-CM

## 2017-04-03 DIAGNOSIS — I10 Essential (primary) hypertension: Secondary | ICD-10-CM

## 2017-04-03 DIAGNOSIS — M25561 Pain in right knee: Secondary | ICD-10-CM

## 2017-04-03 DIAGNOSIS — R809 Proteinuria, unspecified: Secondary | ICD-10-CM

## 2017-04-03 DIAGNOSIS — M8000XD Age-related osteoporosis with current pathological fracture, unspecified site, subsequent encounter for fracture with routine healing: Secondary | ICD-10-CM | POA: Diagnosis not present

## 2017-04-03 DIAGNOSIS — R059 Cough, unspecified: Secondary | ICD-10-CM

## 2017-04-03 DIAGNOSIS — S22000A Wedge compression fracture of unspecified thoracic vertebra, initial encounter for closed fracture: Secondary | ICD-10-CM | POA: Diagnosis not present

## 2017-04-03 DIAGNOSIS — E1129 Type 2 diabetes mellitus with other diabetic kidney complication: Secondary | ICD-10-CM | POA: Diagnosis not present

## 2017-04-03 LAB — POCT GLYCOSYLATED HEMOGLOBIN (HGB A1C): Hemoglobin A1C: 7.2

## 2017-04-03 NOTE — Progress Notes (Signed)
Name: Taylor [name]   MRN: [id number]    DOB: 1932/09/27   Date:04/03/2017       Progress Note  Subjective  Chief Complaint  Chief Complaint  Patient presents with  . Diabetes  . Hypertension  . Hyperlipidemia    HPI   HTN: she was seen with a chronic cough January 2018, CXR negative for lung problems, ace stopped and is on Benicar, bp is at goal for her. She is hoarse. No chest pain or palpitation  Hyperlipidemia: she is taking pravastatin and Omega 3 fatty acids, no myalgia. She has cramps on her hands  DM: she states glucose goes up and down, but usually 150 in the mornings, She denies polyphagia, polydipsia or polyuria. Eye exam is up to date foot exam done by Dr. Garry Heater has microalbuminuria and is taking ARB. She also has dyslipidemia with low HDL, but LDL is at goal on statin therapy, currently also on Lovaza.   GERD: she is off Omeprazole, she is now taking Ranitidine but is hoarse and having a cough, discussed going back on PPI, but she wants to hold off , she thinks symptoms secondary to recent cold.  OA: hands/ right knee /chronic back pain: explained the risk of narcotics,she goes to pain clinic, Dr. Andree Elk. Discussed referral to Ortho but she states she does not want to have another surgery. She has difficulty walking since fall 10/2016 - went to rehab and is now having home PT still using a walker and is afraid of falling because she still feels weak, she denies any new symptoms.   Osteoporosis: had recent CXR that showed compression fractures, she states that she has noticed pain on her bra line, getting better, she is on Fosamax but explained she needs labs and may need to go on Forteo or Prolia , she agrees on seeing endocrinologist   Patient Active Problem List   Diagnosis Date Noted  . Osteoporotic compression fracture of spine ([place]) 03/22/2017  . Assistance needed with transportation 03/21/2017  . Iron deficiency anemia 12/19/2016  . Aortic  atherosclerosis ([place]) 11/23/2016  . Anterolisthesis 11/23/2016  . Chronic pain syndrome 09/13/2016  . Vitamin D deficiency 08/19/2016  . Anemia, unspecified 08/19/2016  . Primary open angle glaucoma (POAG) of both eyes, severe stage 08/17/2016  . Hypertensive retinopathy 08/17/2016  . Long term current use of opiate analgesic 07/19/2016  . Dyslipidemia associated with type 2 diabetes mellitus ([place]) 04/16/2016  . Chronic midline low back pain without sciatica 04/16/2016  . Chronic pain of right knee 04/16/2016  . Vaginal dryness 02/22/2015  . Calculus of kidney 11/10/2014  . Osteoarthritis, multiple sites 09/09/2014  . Type 2 diabetes mellitus with microalbuminuria ([place]) 09/09/2014  . Glaucoma 09/09/2014  . BP (high blood pressure) 09/09/2014  . Chronic pain 09/09/2014  . Tinea corporis 09/09/2014  . Umbilical hernia 51/70/0174  . Lump or mass in breast     Past Surgical History:  Procedure Laterality Date  . ABDOMINAL HYSTERECTOMY  1958   menorrhagia  . BACK SURGERY    . CATARACT EXTRACTION, BILATERAL Bilateral    1st 06/07/15 and the 2nd 06/21/15  . CHOLECYSTECTOMY    . COLONOSCOPY  2003   [facility]  . CORONARY ARTERY BYPASS GRAFT Left 01/03/2012,01/24/2012   Left FNA, Left breast [facility] bx  . KNEE SURGERY  2009,2011,2012   twice on right and once on left  . KNEE SURGERY    . LIPOMA EXCISION  1998  . [place]  2004  .  TONSILLECTOMY     age of 32    Family History  Problem Relation Age of Onset  . Cancer Mother        skin cancer  . Stroke Mother   . Heart disease Father   . Breast cancer Neg Hx     Social History   Socioeconomic History  . Marital status: Divorced    Spouse name: Not on file  . Number of children: 4  . Years of education: Not on file  . Highest education level: Not on file  Social Needs  . Financial resource strain: Not on file  . Food insecurity - worry: Not on file  . Food insecurity - inability: Not on file  . Transportation needs -  medical: Not on file  . Transportation needs - non-medical: Not on file  Occupational History  . 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 Smoking status: Never Smoker  . Smokeless tobacco: Never Used  Substance and Sexual Activity  . Alcohol use: No    Alcohol/week: 0.0 oz  . Drug use: No  . Sexual activity: No  Other Topics Concern  . Not on file  Social History Narrative   Lives alone   Children do not live in town   Friend helps out: Cira Rue - [phone]   Independent prior to her fall 10/2016     Current Outpatient Medications:  .  acetaminophen (TYLENOL) 500 MG tablet, Take 1 tablet (500 mg total) by mouth 3 (three) times daily. (Patient taking differently: Take 500 mg by mouth as needed. ), Disp: 90 tablet, Rfl: 0 .  alendronate (FOSAMAX) 70 MG tablet, TAKE 1 TABLET WEEKLY, Disp: 12 tablet, Rfl: 1 .  amLODipine (NORVASC) 10 MG tablet, Take 1 tablet (10 mg total) by mouth daily., Disp: 90 tablet, Rfl: 1 .  benzonatate (TESSALON) 100 MG capsule, Take 1-2 capsules (100-200 mg total) by mouth 3 (three) times daily as needed for cough., Disp: 30 capsule, Rfl: 0 .  BESIVANCE 0.6 % SUSP, 1 DROP IN SURGICAL EYE TWICE A DAY STARTING 1 DAY BEFORE SURGERY, Disp: , Rfl: 1 .  bimatoprost (LUMIGAN) 0.01 % SOLN, Place 1 drop into both eyes daily. , Disp: , Rfl:  .  brimonidine-timolol (COMBIGAN) 0.2-0.5 % ophthalmic solution, Place 1 drop into both eyes See admin instructions. 1 drop into both eyes in the morning, then 1 drop into both eyes 8 hours later, Disp: , Rfl:  .  Cholecalciferol (VITAMIN D-1000 MAX ST) 1000 UNITS tablet, Take 1,000 Units by mouth daily. Reported on 08/05/2015, Disp: , Rfl:  .  conjugated estrogens (PREMARIN) vaginal cream, Place 1 Applicatorful vaginally daily., Disp: 42.5 g, Rfl: 5 .  fluticasone (FLONASE) 50 MCG/ACT nasal spray, Place 2 sprays into both nostrils daily., Disp: 16 g, Rfl: 1 .  glucose blood test  strip, Use as instructed, Disp: 100 each, Rfl: 12 .  lidocaine (XYLOCAINE) 5 % ointment, Apply 1 application topically 3 (three) times daily as needed. 2 grams, Disp: 35.44 g, Rfl: 0 .  metFORMIN (GLUCOPHAGE) 500 MG tablet, Take 1 tablet (500 mg total) by mouth 2 (two) times daily with a meal., Disp: 180 tablet, Rfl: 1 .  olmesartan (BENICAR) 20 MG tablet, Take 1 tablet (20 mg total) by mouth daily., Disp: 30 tablet, Rfl: 0 .  omega-3 acid ethyl esters (LOVAZA) 1 g capsule, Take 1 capsule (1 g total) by mouth 2 (two) times  daily., Disp: 180 capsule, Rfl: 1 .  ONETOUCH DELICA LANCETS FINE MISC, 1 each by Does not apply route daily. 30 G Lancets- One Touch Delica, Disp: 213 each, Rfl: 1 .  [START ON 05/07/2017] oxyCODONE-acetaminophen (PERCOCET) 7.5-325 MG tablet, Take 1 tablet by mouth every 8 (eight) hours as needed for moderate pain or severe pain., Disp: 90 tablet, Rfl: 0 .  [START ON 04/07/2017] oxyCODONE-acetaminophen (PERCOCET) 7.5-325 MG tablet, Take 1 tablet by mouth every 8 (eight) hours as needed for moderate pain or severe pain., Disp: 90 tablet, Rfl: 0 .  pravastatin (PRAVACHOL) 40 MG tablet, TAKE 1 TABLET (40 MG TOTAL) BY MOUTH AT BEDTIME., Disp: 90 tablet, Rfl: 1 .  ranitidine (ZANTAC) 150 MG tablet, TAKE 1 TABLET BY MOUTH DAILY AS NEEDED FOR HEARTBURN, *TAKE THIS IN PLACCE OF OMEPRAZOLE*, Disp: 90 tablet, Rfl: 1 .  Vitamin D, Ergocalciferol, (DRISDOL) 50000 units CAPS capsule, TAKE 1 CAPSULE (50,000 UNITS TOTAL) BY MOUTH EVERY 7 (SEVEN) DAYS., Disp: 12 capsule, Rfl: 0 .  ketoconazole (NIZORAL) 2 % cream, Apply 1 application topically daily. (Patient not taking: Reported on 04/03/2017), Disp: 15 g, Rfl: 0  Allergies  Allergen Reactions  . Ace Inhibitors     cough     ROS  Constitutional: Negative for fever or weight change.  Respiratory:Positive for cough and has mild  shortness of breath with activity - chronic .   Cardiovascular: Negative for chest pain or palpitations.   Gastrointestinal: Negative for abdominal pain, no bowel changes.  Musculoskeletal: Positive  for gait problem and  joint swelling.  Skin: Negative for rash.  Neurological: Negative for dizziness or headache.  No other specific complaints in a complete review of systems (except as listed in HPI above).  Objective  Vitals:   04/03/17 1131  BP: 140/70  Pulse: 76  Resp: 14  SpO2: 99%  Weight: 213 lb 12.8 oz (97 kg)  Height: 5\' 1"  (1.549 m)    Body mass index is 40.4 kg/m.  Physical Exam  Constitutional: Patient appears well-developed and well-nourished. Obese  No distress.  HEENT: head atraumatic, normocephalic, pupils equal and reactive to light,  neck supple, throat within normal limits Cardiovascular: Normal rate, regular rhythm and normal heart sounds.  No murmur heard. 1 plus  BLE edema. Pulmonary/Chest: Effort normal and breath sounds normal. No respiratory distress. Abdominal: Soft.  There is no tenderness. Psychiatric: Patient has a normal mood and affect. behavior is normal. Judgment and thought content normal. Muscular Skeletal: on wheelchair, enlarged right knee, mild effusion, decrease rom   Recent Results (from the past 2160 hour(s))  CBC     Status: Abnormal   Collection Time: 03/21/17  2:02 PM  Result Value Ref Range   WBC 6.9 3.8 - 10.8 Thousand/uL   RBC 4.21 3.80 - 5.10 Million/uL   Hemoglobin 11.4 (L) 11.7 - 15.5 g/dL   HCT 35.1 35.0 - 45.0 %   MCV 83.4 80.0 - 100.0 fL   MCH 27.1 27.0 - 33.0 pg   MCHC 32.5 32.0 - 36.0 g/dL   RDW 13.2 11.0 - 15.0 %   Platelets 346 140 - 400 Thousand/uL   MPV 9.9 7.5 - 12.5 fL  BASIC METABOLIC PANEL WITH GFR     Status: Abnormal   Collection Time: 03/21/17  2:02 PM  Result Value Ref Range   Glucose, Bld 143 (H) 65 - 99 mg/dL    Comment: .            Fasting reference interval .  For someone without known diabetes, a glucose value >125 mg/dL indicates that they may have diabetes and this should be confirmed with  a follow-up test. .    BUN 23 7 - 25 mg/dL   Creat 1.26 (H) 0.60 - 0.88 mg/dL    Comment: For patients >21 years of age, the reference limit for Creatinine is approximately 13% higher for people identified as African-American. .    GFR, Est Non African American 40 (L) > OR = 60 mL/min/1.82m2   GFR, Est African American 46 (L) > OR = 60 mL/min/1.74m2   BUN/Creatinine Ratio 18 6 - 22 (calc)   Sodium 139 135 - 146 mmol/L   Potassium 4.4 3.5 - 5.3 mmol/L   Chloride 105 98 - 110 mmol/L   CO2 24 20 - 32 mmol/L   Calcium 9.3 8.6 - 10.4 mg/dL  POCT HgB A1C     Status: Abnormal   Collection Time: 04/03/17 11:46 AM  Result Value Ref Range   Hemoglobin A1C 7.2     Diabetic Foot Exam: Diabetic Foot Exam - Simple   Simple Foot Form Diabetic Foot exam was performed with the following findings:  Yes 04/03/2017 12:10 PM  Visual Inspection No deformities, no ulcerations, no other skin breakdown bilaterally:  Yes Sensation Testing Intact to touch and monofilament testing bilaterally:  Yes Pulse Check Posterior Tibialis and Dorsalis pulse intact bilaterally:  Yes Comments      PHQ2/9: Depression screen [facility] 2/9 03/04/2017 01/07/2017 11/23/2016 11/13/2016 09/13/2016  Decreased Interest 0 0 0 0 0  Down, Depressed, Hopeless 0 0 0 0 0  PHQ - 2 Score 0 0 0 0 0     Fall Risk: Fall Risk  04/03/2017 03/04/2017 01/07/2017 11/23/2016 11/13/2016  Falls in the past year? No No No Yes Yes  Comment - - - - -  Number falls in past yr: - - - 1 1  Injury with Fall? - - - Yes Yes  Risk Factor Category  - - - - High Fall Risk    Functional Status Survey: Is the patient deaf or have difficulty hearing?: No Does the patient have difficulty seeing, even when wearing glasses/contacts?: No Does the patient have difficulty concentrating, remembering, or making decisions?: No Does the patient have difficulty walking or climbing stairs?: Yes Does the patient have difficulty dressing or bathing?: No Does the  patient have difficulty doing errands alone such as visiting a doctor's office or shopping?: No    Assessment & Plan  1. Type 2 diabetes mellitus with microalbuminuria, without long-term current use of insulin (HCC)  - POCT HgB A1C  2. Cough  Discussed going back on PPI, she is off ACE, doing better, she wants to hold off on any changes at this time  3. Closed compression fracture of thoracic vertebra, initial encounter ([place])   4. Osteoporosis with current pathological fracture with routine healing, unspecified osteoporosis type, subsequent encounter  - DG Bone Density; Future - Ambulatory referral to Endocrinology  5. Essential hypertension  Continue Benicar  Discussed placement, she refuses it.

## 2017-04-03 NOTE — Telephone Encounter (Signed)
Copied from [place]. Topic: Quick Communication - See Telephone Encounter >> Apr 03, 2017  3:04 PM Ivar Drape wrote: CRM for notification. See Telephone encounter for:  04/03/17. Patient wants to know if she can have a refill of the benzonatate (TESSALON) 100 MG capsule medication.  She said her cough is better, but still not gone.  She only have tablets left.  She could barely talk over the phone.  Please advise.

## 2017-04-04 ENCOUNTER — Telehealth: Payer: Self-pay | Admitting: Family Medicine

## 2017-04-04 MED ORDER — BENZONATATE 100 MG PO CAPS: 100.0000 mg | ORAL_CAPSULE | Freq: Three times a day (TID) | ORAL | 0 refills | Status: DC | PRN

## 2017-04-04 NOTE — Telephone Encounter (Unsigned)
Copied from [place]. Topic: Quick Communication - See Telephone Encounter >> Apr 04, 2017  9:53 AM Percell Belt A wrote: CRM for notification. See Telephone encounter for: pt called in and wanted to talk to [place] about Dexa scan.  She thinks she has had one.  She is requesting call back because she has a few questions about it  Best number [phone]   04/04/17.

## 2017-04-17 ENCOUNTER — Other Ambulatory Visit: Payer: Self-pay

## 2017-04-17 DIAGNOSIS — I10 Essential (primary) hypertension: Secondary | ICD-10-CM

## 2017-04-17 NOTE — Telephone Encounter (Signed)
Hypertension medication request: Olmesartan to [facility] with a 90 day supply.   Last office visit pertaining to hypertension:  BP Readings from Last 3 Encounters:  04/03/17 140/70  03/21/17 132/72  03/04/17 (!) 143/53    Lab Results  Component Value Date   CREATININE 1.26 (H) 03/21/2017   BUN 23 03/21/2017   NA 139 03/21/2017   K 4.4 03/21/2017   CL 105 03/21/2017   CO2 24 03/21/2017     Follow up on 08/05/2017

## 2017-04-18 ENCOUNTER — Other Ambulatory Visit: Payer: Self-pay | Admitting: Family Medicine

## 2017-04-18 DIAGNOSIS — I10 Essential (primary) hypertension: Secondary | ICD-10-CM

## 2017-04-19 ENCOUNTER — Ambulatory Visit: Payer: Medicare Other | Admitting: Family Medicine

## 2017-04-19 MED ORDER — OLMESARTAN MEDOXOMIL 20 MG PO TABS: 20.0000 mg | ORAL_TABLET | Freq: Every day | ORAL | 0 refills | Status: DC

## 2017-04-30 ENCOUNTER — Ambulatory Visit
Admission: RE | Admit: 2017-04-30 | Discharge: 2017-04-30 | Disposition: A | Discharge status: Home / Self Care | Payer: Medicare Other | Source: Ambulatory Visit | Attending: Family Medicine | Admitting: Family Medicine

## 2017-04-30 DIAGNOSIS — M8088XA Other osteoporosis with current pathological fracture, vertebra(e), initial encounter for fracture: Secondary | ICD-10-CM | POA: Diagnosis not present

## 2017-04-30 DIAGNOSIS — M810 Age-related osteoporosis without current pathological fracture: Secondary | ICD-10-CM | POA: Diagnosis not present

## 2017-04-30 DIAGNOSIS — S22000A Wedge compression fracture of unspecified thoracic vertebra, initial encounter for closed fracture: Secondary | ICD-10-CM

## 2017-04-30 DIAGNOSIS — M8000XD Age-related osteoporosis with current pathological fracture, unspecified site, subsequent encounter for fracture with routine healing: Secondary | ICD-10-CM

## 2017-05-18 ENCOUNTER — Other Ambulatory Visit: Payer: Self-pay | Admitting: Family Medicine

## 2017-05-18 DIAGNOSIS — J3089 Other allergic rhinitis: Secondary | ICD-10-CM

## 2017-05-27 ENCOUNTER — Encounter: Payer: Self-pay | Admitting: Nurse Practitioner

## 2017-05-27 ENCOUNTER — Ambulatory Visit: Payer: Medicare Other | Attending: Nurse Practitioner | Admitting: Nurse Practitioner

## 2017-05-27 ENCOUNTER — Other Ambulatory Visit: Payer: Self-pay

## 2017-05-27 VITALS — BP 174/72 | HR 75 | Temp 98.0°F | Resp 18 | Ht 61.0 in | Wt 213.0 lb

## 2017-05-27 DIAGNOSIS — E119 Type 2 diabetes mellitus without complications: Secondary | ICD-10-CM | POA: Insufficient documentation

## 2017-05-27 DIAGNOSIS — Z888 Allergy status to other drugs, medicaments and biological substances status: Secondary | ICD-10-CM | POA: Insufficient documentation

## 2017-05-27 DIAGNOSIS — Z951 Presence of aortocoronary bypass graft: Secondary | ICD-10-CM | POA: Diagnosis not present

## 2017-05-27 DIAGNOSIS — B354 Tinea corporis: Secondary | ICD-10-CM | POA: Diagnosis not present

## 2017-05-27 DIAGNOSIS — H409 Unspecified glaucoma: Secondary | ICD-10-CM | POA: Diagnosis not present

## 2017-05-27 DIAGNOSIS — I10 Essential (primary) hypertension: Secondary | ICD-10-CM | POA: Insufficient documentation

## 2017-05-27 DIAGNOSIS — M109 Gout, unspecified: Secondary | ICD-10-CM | POA: Diagnosis not present

## 2017-05-27 DIAGNOSIS — M159 Polyosteoarthritis, unspecified: Secondary | ICD-10-CM

## 2017-05-27 DIAGNOSIS — N200 Calculus of kidney: Secondary | ICD-10-CM | POA: Diagnosis not present

## 2017-05-27 DIAGNOSIS — M25562 Pain in left knee: Secondary | ICD-10-CM

## 2017-05-27 DIAGNOSIS — M25561 Pain in right knee: Secondary | ICD-10-CM | POA: Insufficient documentation

## 2017-05-27 DIAGNOSIS — M1990 Unspecified osteoarthritis, unspecified site: Secondary | ICD-10-CM | POA: Insufficient documentation

## 2017-05-27 DIAGNOSIS — G894 Chronic pain syndrome: Secondary | ICD-10-CM

## 2017-05-27 DIAGNOSIS — G8929 Other chronic pain: Secondary | ICD-10-CM

## 2017-05-27 DIAGNOSIS — Z823 Family history of stroke: Secondary | ICD-10-CM | POA: Diagnosis not present

## 2017-05-27 DIAGNOSIS — K219 Gastro-esophageal reflux disease without esophagitis: Secondary | ICD-10-CM | POA: Insufficient documentation

## 2017-05-27 DIAGNOSIS — Z809 Family history of malignant neoplasm, unspecified: Secondary | ICD-10-CM | POA: Insufficient documentation

## 2017-05-27 DIAGNOSIS — N63 Unspecified lump in unspecified breast: Secondary | ICD-10-CM | POA: Insufficient documentation

## 2017-05-27 DIAGNOSIS — E559 Vitamin D deficiency, unspecified: Secondary | ICD-10-CM | POA: Insufficient documentation

## 2017-05-27 DIAGNOSIS — Z8249 Family history of ischemic heart disease and other diseases of the circulatory system: Secondary | ICD-10-CM | POA: Diagnosis not present

## 2017-05-27 DIAGNOSIS — E785 Hyperlipidemia, unspecified: Secondary | ICD-10-CM | POA: Insufficient documentation

## 2017-05-27 DIAGNOSIS — Z79891 Long term (current) use of opiate analgesic: Secondary | ICD-10-CM

## 2017-05-27 DIAGNOSIS — Z9049 Acquired absence of other specified parts of digestive tract: Secondary | ICD-10-CM | POA: Insufficient documentation

## 2017-05-27 DIAGNOSIS — Z79899 Other long term (current) drug therapy: Secondary | ICD-10-CM | POA: Insufficient documentation

## 2017-05-27 DIAGNOSIS — E669 Obesity, unspecified: Secondary | ICD-10-CM | POA: Diagnosis not present

## 2017-05-27 DIAGNOSIS — M545 Low back pain: Secondary | ICD-10-CM

## 2017-05-27 DIAGNOSIS — D509 Iron deficiency anemia, unspecified: Secondary | ICD-10-CM | POA: Diagnosis not present

## 2017-05-27 DIAGNOSIS — K429 Umbilical hernia without obstruction or gangrene: Secondary | ICD-10-CM | POA: Insufficient documentation

## 2017-05-27 DIAGNOSIS — Z7984 Long term (current) use of oral hypoglycemic drugs: Secondary | ICD-10-CM | POA: Diagnosis not present

## 2017-05-27 MED ORDER — OXYCODONE-ACETAMINOPHEN 7.5-325 MG PO TABS: 1.0000 | ORAL_TABLET | Freq: Three times a day (TID) | ORAL | 0 refills | Status: DC | PRN

## 2017-05-27 NOTE — Progress Notes (Signed)
Nursing Pain Medication Assessment:  Safety precautions to be maintained throughout the outpatient stay will include: orient to surroundings, keep bed in low position, maintain call bell within reach at all times, provide assistance with transfer out of bed and ambulation.  Medication Inspection Compliance: Pill count conducted under aseptic conditions, in front of the patient. Neither the pills nor the bottle was removed from the patient's sight at any time. Once count was completed pills were immediately returned to the patient in their original bottle.  Medication: Oxycodone/APAP Pill/Patch Count: 37 of 90 pills remain Pill/Patch Appearance: Markings consistent with prescribed medication Bottle Appearance: Standard pharmacy container. Clearly labeled. Filled Date: 03/ 19 / 2019 Last Medication intake:  Today

## 2017-05-27 NOTE — Progress Notes (Signed)
Patient's Name: Taylor [name]  MRN: [id number]  Referring Provider: [name], MD  DOB: 01/04/1933  PCP: [name], MD  DOS: 05/27/2017  Note by: Vevelyn Francois NP  Service setting: Ambulatory outpatient  Specialty: Interventional Pain Management  Location: [facility] (AMB) Pain Management Facility    Patient type: Established    Primary Reason(s) for Visit: Encounter for prescription drug management. (Level of risk: moderate)  CC: Back Pain (lower back pain; bilateral knee (rt is worse); hands bilateral )  HPI  [name] is a 84 y.o. year old, female patient, who comes today for a medication management evaluation. She has Lump or mass in breast; Umbilical hernia; Osteoarthritis, multiple sites; Type 2 diabetes mellitus with microalbuminuria ([place]); Glaucoma; BP (high blood pressure); Chronic pain; Tinea corporis; Calculus of kidney; Vaginal dryness; Dyslipidemia associated with type 2 diabetes mellitus ([place]); Chronic midline low back pain without sciatica; Chronic pain of right knee; Long term current use of opiate analgesic; Primary open angle glaucoma (POAG) of both eyes, severe stage; Hypertensive retinopathy; Vitamin D deficiency; Anemia, unspecified; Chronic pain syndrome; Aortic atherosclerosis ([place]); Anterolisthesis; Iron deficiency anemia; Assistance needed with transportation; and Osteoporotic compression fracture of spine ([place]) on their problem list. Her primarily concern today is the Back Pain (lower back pain; bilateral knee (rt is worse); hands bilateral )  Pain Assessment: Location: Right, Mid, Lower Back Radiating: radiated from lower back to right knee. Left knee is also painful. Cramping pain in hands.  Onset: More than a month ago Duration: Chronic pain Quality: Pins and needles, Cramping, Sharp Severity: 6 /10 (self-reported pain score)  Note: Reported level is compatible with observation. Clinically the patient looks like a 3/10 A 3/10 is viewed as "Moderate" and  described as significantly interfering with activities of daily living (ADL). It becomes difficult to feed, bathe, get dressed, get on and off the toilet or to perform personal hygiene functions. Difficult to get in and out of bed or a chair without assistance. Very distracting. With effort, it can be ignored when deeply involved in activities. Information on the proper use of the pain scale provided to the patient today. When using our objective Pain Scale, levels between 6 and 10/10 are said to belong in an emergency room, as it progressively worsens from a 6/10, described as severely limiting, requiring emergency care not usually available at an outpatient pain management facility. At a 6/10 level, communication becomes difficult and requires great effort. Assistance to reach the emergency department may be required. Facial flushing and profuse sweating along with potentially dangerous increases in heart rate and blood pressure will be evident. Effect on ADL:   Timing: Intermittent Modifying factors: Ice and heat. Ortho gloves for hands   [name] was last scheduled for an appointment on 03/04/2017 for medication management. During today's appointment we reviewed [name]'s chronic pain status, as well as her outpatient medication regimen. She admits that her pain is stable. She states that "her right knee will not get any better secondary to surgical revisions".   The patient  reports that she does not use drugs. Her body mass index is 40.25 kg/m.  Further details on both, my assessment(s), as well as the proposed treatment plan, please see below.  Controlled Substance Pharmacotherapy Assessment REMS (Risk Evaluation and Mitigation Strategy)  Analgesic:Oxycodone/acetaminophen 7.5/325 mg 3times daily MME/day:33.11m/day.   TDewayne Shorter RN  05/27/2017  2:12 PM  Signed Nursing Pain Medication Assessment:  Safety precautions to be maintained throughout the outpatient stay  will include:  orient to surroundings, keep bed in low position, maintain call bell within reach at all times, provide assistance with transfer out of bed and ambulation.  Medication Inspection Compliance: Pill count conducted under aseptic conditions, in front of the patient. Neither the pills nor the bottle was removed from the patient's sight at any time. Once count was completed pills were immediately returned to the patient in their original bottle.  Medication: Oxycodone/APAP Pill/Patch Count: 37 of 90 pills remain Pill/Patch Appearance: Markings consistent with prescribed medication Bottle Appearance: Standard pharmacy container. Clearly labeled. Filled Date: 03/ 19 / 2019 Last Medication intake:  Today   Pharmacokinetics: Liberation and absorption (onset of action): WNL Distribution (time to peak effect): WNL Metabolism and excretion (duration of action): WNL         Pharmacodynamics: Desired effects: Analgesia: [name] reports >50% benefit. Functional ability: Patient reports that medication allows her to accomplish basic ADLs Clinically meaningful improvement in function (CMIF): Sustained CMIF goals met Perceived effectiveness: Described as relatively effective, allowing for increase in activities of daily living (ADL) Undesirable effects: Side-effects or Adverse reactions: None reported Monitoring: [place] PMP: Online review of the past 20-monthperiod conducted. Compliant with practice rules and regulations Last UDS on record: Summary  Date Value Ref Range Status  09/13/2016 FINAL  Final    Comment:    ==================================================================== TOXASSURE SELECT 13 (MW) ==================================================================== Test                             Result       Flag       Units Drug Present and Declared for Prescription Verification   Oxycodone                      175          EXPECTED   ng/mg creat   Oxymorphone                    591           EXPECTED   ng/mg creat   Noroxycodone                   644          EXPECTED   ng/mg creat   Noroxymorphone                 234          EXPECTED   ng/mg creat    Sources of oxycodone are scheduled prescription medications.    Oxymorphone, noroxycodone, and noroxymorphone are expected    metabolites of oxycodone. Oxymorphone is also available as a    scheduled prescription medication. ==================================================================== Test                      Result    Flag   Units      Ref Range   Creatinine              125              mg/dL      >=20 ==================================================================== Declared Medications:  The flagging and interpretation on this report are based on the  following declared medications.  Unexpected results may arise from  inaccuracies in the declared medications.  **Note: The testing scope of this panel includes these medications:  Oxycodone (Percocet)  **Note: The testing scope of  this panel does not include following  reported medications:  Acetaminophen (Percocet)  Acetaminophen (Tylenol)  Alendronate (Fosamax)  Amlodipine (Norvasc)  Bimatoprost (Lumigan)  Cholecalciferol  Cyclobenzaprine (Flexeril)  Estrogen (Premarin)  Fluticasone (Flonase)  Ketoconazole (Nizoral)  Lidocaine (Xylocaine)  Metformin  Omega-3 Fatty Acids (Lovaza)  Pravastatin (Pravachol)  Quinapril (Accupril)  Ranitidine (Zantac)  Vitamin D2 (Drisdol) ==================================================================== For clinical consultation, please call [phone]. ====================================================================    UDS interpretation: Compliant          Medication Assessment Form: Reviewed. Patient indicates being compliant with therapy Treatment compliance: Compliant Risk Assessment Profile: Aberrant behavior: See prior evaluations. None observed or detected today Comorbid factors increasing  risk of overdose: See prior notes. No additional risks detected today Risk of substance use disorder (SUD): Low  ORT Scoring interpretation table:  Score <3 = Low Risk for SUD  Score between 4-7 = Moderate Risk for SUD  Score >8 = High Risk for Opioid Abuse   Risk Mitigation Strategies:  Patient Counseling: Covered Patient-Prescriber Agreement (PPA): Present and active  Notification to other healthcare providers: Done  Pharmacologic Plan: No change in therapy, at this time.             Laboratory Chemistry  Inflammation Markers (CRP: Acute Phase) (ESR: Chronic Phase) No results found for: CRP, ESRSEDRATE, LATICACIDVEN                       Rheumatology Markers No results found for: RF, ANA, LABURIC, URICUR, LYMEIGGIGMAB, [facility]                      Renal Function Markers Lab Results  Component Value Date   BUN 23 03/21/2017   CREATININE 1.26 (H) 03/21/2017   GFRAA 46 (L) 03/21/2017   GFRNONAA 40 (L) 03/21/2017                              Hepatic Function Markers Lab Results  Component Value Date   AST 9 (L) 08/14/2016   ALT 6 08/14/2016   ALBUMIN 4.0 08/14/2016   ALKPHOS 56 08/14/2016                        Electrolytes Lab Results  Component Value Date   NA 139 03/21/2017   K 4.4 03/21/2017   CL 105 03/21/2017   CALCIUM 9.3 03/21/2017                        Neuropathy Markers Lab Results  Component Value Date   HGBA1C 7.2 04/03/2017                        Bone Pathology Markers Lab Results  Component Value Date   VD25OH 16 (L) 08/14/2016                         Coagulation Parameters Lab Results  Component Value Date   PLT 346 03/21/2017                        Cardiovascular Markers Lab Results  Component Value Date   HGB 11.4 (L) 03/21/2017   HCT 35.1 03/21/2017                         CA  Markers No results found for: CEA, CA125, LABCA2                      Note: Lab results reviewed.  Recent Diagnostic Imaging Results  DG Bone  Density EXAM: DUAL X-RAY ABSORPTIOMETRY (DXA) FOR BONE MINERAL DENSITY  IMPRESSION: Dear Dr. Ancil Boozer,  Your patient Evalene Vath completed a BMD test on 04/30/2017 using the [facility] (analysis version: 14.10) manufactured by [employer]. The following summarizes the results of our evaluation.  PATIENT BIOGRAPHICAL: Name: Danilyn, Cocke Patient ID: [id number] Birth Date: 03-08-1932 Height: 60.5 in. Gender: Female Exam Date: 04/30/2017 Weight: 213.3 lbs. Indications: Advanced Age, Diabetic, Height Loss, High Risk Meds, History of Fracture (Adult), Hysterectomy, Oophorectomy Bilateral, Postmenopausal, Vitamin D Deficiency Fractures: Toes Treatments: Flonase, Fosamax, Metformin, Premarin, Vitamin D  ASSESSMENT:  The BMD measured at Femur Neck Right is 0.575 g/cm2 with a T-score of -3.3. This patient is considered osteoporotic according to [facility]) criteria. Lumbar spine was not utilized due to surgerical hardware.  Site Region Measured Measured WHO Young Adult BMD Date       Age      Classification T-score DualFemur Neck Right 04/30/2017 82.2 Osteoporosis -3.3 0.575 g/cm2 DualFemur Neck Right 08/13/2013 78.4 Osteoporosis -3.3 0.576 g/cm2  DualFemur Total Mean 04/30/2017 82.2 Osteopenia -1.9 0.771 g/cm2 DualFemur Total Mean 08/13/2013 78.4 Osteopenia -1.8 0.780 g/cm2  Left Forearm Radius 33% 04/30/2017 82.2 Osteopenia -2.3 0.674 g/cm2 Left Forearm Radius 33% 08/13/2013 78.4 Osteopenia -2.3 0.678 g/cm2  World Health Organization [facility]) criteria for post-menopausal, Caucasian Women: Normal:       T-score at or above -1 SD Osteopenia:   T-score between -1 and -2.5 SD Osteoporosis: T-score at or below -2.5 SD  RECOMMENDATIONS:  1. All patients should optimize calcium and vitamin D intake. 2. Consider FDA-approved medical therapies in postmenopausal women and men aged 38 years and older, based on the following: a. A hip or vertebral(clinical  or morphometric) fracture b. T-score < -2.5 at the femoral neck or spine after appropriate evaluation to exclude secondary causes c. Low bone mass (T-score between -1.0 and -2.5 at the femoral neck or spine) and a 10-year probability of a hip fracture > 3% or a 10-year probability of a major osteoporosis-related fracture > 20% based on the [place]-adapted WHO algorithm d. Clinician judgment and/or patient preferences may indicate treatment for people with 10-year fracture probabilities above or below these levels 3. Patients with diagnosis of osteoporosis or at high risk for fracture should have regular bone mineral density tests. For patients eligible for Medicare, routine testing is allowed once every 2 years. The testing frequency can be increased to one year for patients who have rapidly progressing disease, those who are receiving or discontinuing medical therapy to restore bone mass, or have additional risk factors.  FOLLOW-UP: People with diagnosed cases of osteoporosis or at high risk for fracture should have regular bone mineral density tests. For patients eligible for Medicare, routine testing is allowed once every 2 years. The testing frequency can be increased to one year for patients who have rapidly progressing disease, those who are receiving or discontinuing medical therapy to restore bone mass, or have additional risk factors.  I have reviewed this report, and agree with the above findings.  [facility] Radiology  Electronically Signed   By: Marijo Conception, M.D.   On: 04/30/2017 15:08  Complexity Note: Imaging results reviewed. Results shared with Ms. Birch, using [employer].  Meds   Current Outpatient Medications:  .  acetaminophen (TYLENOL) 500 MG tablet, Take 1 tablet (500 mg total) by mouth 3 (three) times daily. (Patient taking differently: Take 500 mg by mouth as needed. ), Disp: 90 tablet, Rfl: 0 .  alendronate (FOSAMAX) 70 MG  tablet, TAKE 1 TABLET WEEKLY, Disp: 12 tablet, Rfl: 1 .  amLODipine (NORVASC) 10 MG tablet, Take 1 tablet (10 mg total) by mouth daily., Disp: 90 tablet, Rfl: 1 .  BESIVANCE 0.6 % SUSP, 1 DROP IN SURGICAL EYE TWICE A DAY STARTING 1 DAY BEFORE SURGERY, Disp: , Rfl: 1 .  bimatoprost (LUMIGAN) 0.01 % SOLN, Place 1 drop into both eyes daily. , Disp: , Rfl:  .  brimonidine-timolol (COMBIGAN) 0.2-0.5 % ophthalmic solution, Place 1 drop into both eyes See admin instructions. 1 drop into both eyes in the morning, then 1 drop into both eyes 8 hours later, Disp: , Rfl:  .  Cholecalciferol (VITAMIN D-1000 MAX ST) 1000 UNITS tablet, Take 1,000 Units by mouth daily. Reported on 08/05/2015, Disp: , Rfl:  .  conjugated estrogens (PREMARIN) vaginal cream, Place 1 Applicatorful vaginally daily., Disp: 42.5 g, Rfl: 5 .  fluticasone (FLONASE) 50 MCG/ACT nasal spray, SPRAY 2 SPRAYS INTO EACH NOSTRIL EVERY DAY, Disp: 16 g, Rfl: 1 .  glucose blood test strip, Use as instructed, Disp: 100 each, Rfl: 12 .  metFORMIN (GLUCOPHAGE) 500 MG tablet, Take 1 tablet (500 mg total) by mouth 2 (two) times daily with a meal., Disp: 180 tablet, Rfl: 1 .  olmesartan (BENICAR) 20 MG tablet, Take 1 tablet (20 mg total) by mouth daily., Disp: 90 tablet, Rfl: 0 .  omega-3 acid ethyl esters (LOVAZA) 1 g capsule, Take 1 capsule (1 g total) by mouth 2 (two) times daily., Disp: 180 capsule, Rfl: 1 .  ONETOUCH DELICA LANCETS FINE MISC, 1 each by Does not apply route daily. 30 G Lancets- One Touch Delica, Disp: 030 each, Rfl: 1 .  [START ON 07/06/2017] oxyCODONE-acetaminophen (PERCOCET) 7.5-325 MG tablet, Take 1 tablet by mouth every 8 (eight) hours as needed for moderate pain or severe pain., Disp: 90 tablet, Rfl: 0 .  pravastatin (PRAVACHOL) 40 MG tablet, TAKE 1 TABLET (40 MG TOTAL) BY MOUTH AT BEDTIME., Disp: 90 tablet, Rfl: 1 .  ranitidine (ZANTAC) 150 MG tablet, TAKE 1 TABLET BY MOUTH DAILY AS NEEDED FOR HEARTBURN, *TAKE THIS IN PLACCE OF  OMEPRAZOLE*, Disp: 90 tablet, Rfl: 1 .  Vitamin D, Ergocalciferol, (DRISDOL) 50000 units CAPS capsule, TAKE 1 CAPSULE (50,000 UNITS TOTAL) BY MOUTH EVERY 7 (SEVEN) DAYS., Disp: 12 capsule, Rfl: 0 .  ketoconazole (NIZORAL) 2 % cream, Apply 1 application topically daily. (Patient not taking: Reported on 04/03/2017), Disp: 15 g, Rfl: 0 .  [START ON 06/06/2017] oxyCODONE-acetaminophen (PERCOCET) 7.5-325 MG tablet, Take 1 tablet by mouth every 8 (eight) hours as needed for moderate pain or severe pain., Disp: 90 tablet, Rfl: 0  ROS  Constitutional: Denies any fever or chills Gastrointestinal: No reported hemesis, hematochezia, vomiting, or acute GI distress Musculoskeletal: Denies any acute onset joint swelling, redness, loss of ROM, or weakness Neurological: No reported episodes of acute onset apraxia, aphasia, dysarthria, agnosia, amnesia, paralysis, loss of coordination, or loss of consciousness  Allergies  Ms. Birkhead is allergic to ace inhibitors.  [place]  Drug: Ms. Hoggard  reports that she does not use drugs. Alcohol:  reports that she does not drink alcohol. Tobacco:  reports that she has never smoked. She has never used smokeless tobacco.  Medical:  has a past medical history of Abnormal mammogram, unspecified (2013), Breast screening, unspecified (2013), Diabetes mellitus without complication ([place]) (4709), GERD (gastroesophageal reflux disease), Glaucoma (2003), Gout, Hyperlipidemia (2008), Hypertension (1980's), Lump or mass in breast (01/03/2012), Obesity, unspecified (2013), Osteoporosis, Shingles (2013), and Special screening for malignant neoplasms, colon (2013). Surgical: Ms. Point  has a past surgical history that includes Abdominal hysterectomy (1958); Knee surgery (2009,2011,2012); Lipoma excision (1998); Spine surgery (2004); Tonsillectomy; Colonoscopy (2003); Coronary artery bypass graft (Left, 01/03/2012,01/24/2012); Cataract extraction, bilateral (Bilateral); Cholecystectomy; Back  surgery; and Knee surgery. Family: family history includes Cancer in her mother; Heart disease in her father; Stroke in her mother.  Constitutional Exam  General appearance: Well nourished, well developed, and well hydrated. In no apparent acute distress Vitals:   05/27/17 1346  BP: (!) 174/72  Pulse: 75  Resp: 18  Temp: 98 F (36.7 C)  TempSrc: Oral  SpO2: 100%  Weight: 213 lb (96.6 kg)  Height: 5' 1"  (1.549 m)  Psych/Mental status: Alert, oriented x 3 (person, place, & time)       Eyes: PERLA Respiratory: No evidence of acute respiratory distress  Cervical Spine Area Exam  Skin & Axial Inspection: No masses, redness, edema, swelling, or associated skin lesions Alignment: Symmetrical Functional ROM: Unrestricted ROM      Stability: No instability detected Muscle Tone/Strength: Functionally intact. No obvious neuro-muscular anomalies detected. Sensory (Neurological): Unimpaired Palpation: No palpable anomalies              Upper Extremity (UE) Exam    Side: Right upper extremity  Side: Left upper extremity  Skin & Extremity Inspection: Skin color, temperature, and hair growth are WNL. No peripheral edema or cyanosis. No masses, redness, swelling, asymmetry, or associated skin lesions. No contractures.  Skin & Extremity Inspection: Skin color, temperature, and hair growth are WNL. No peripheral edema or cyanosis. No masses, redness, swelling, asymmetry, or associated skin lesions. No contractures.  Functional ROM: Unrestricted ROM          Functional ROM: Unrestricted ROM          Muscle Tone/Strength: Functionally intact. No obvious neuro-muscular anomalies detected.  Muscle Tone/Strength: Functionally intact. No obvious neuro-muscular anomalies detected.  Sensory (Neurological): Unimpaired          Sensory (Neurological): Unimpaired          Palpation: No palpable anomalies              Palpation: No palpable anomalies              Specialized Test(s): Deferred          Specialized Test(s): Deferred          Thoracic Spine Area Exam  Skin & Axial Inspection: No masses, redness, or swelling Alignment: Symmetrical Functional ROM: Unrestricted ROM Stability: No instability detected Muscle Tone/Strength: Functionally intact. No obvious neuro-muscular anomalies detected. Sensory (Neurological): Unimpaired Muscle strength & Tone: No palpable anomalies  Lumbar Spine Area Exam  Skin & Axial Inspection: No masses, redness, or swelling Alignment: Symmetrical Functional ROM: Unrestricted ROM      Stability: No instability detected Muscle Tone/Strength: Functionally intact. No obvious neuro-muscular anomalies detected. Sensory (Neurological): Unimpaired Palpation: No palpable anomalies       Provocative Tests: Lumbar Hyperextension and rotation test: evaluation deferred today       Lumbar Lateral bending test: evaluation deferred today       Patrick's Maneuver: evaluation deferred today  Gait & Posture Assessment  Ambulation: Patient ambulates using a walker Gait: Relatively normal for age and body habitus Posture: WNL   Lower Extremity Exam    Side: Right lower extremity  Side: Left lower extremity  Skin & Extremity Inspection: Evidence of prior arthroplastic surgery  Skin & Extremity Inspection: Evidence of prior arthroplastic surgery  Functional ROM: Decreased ROM          Functional ROM: Decreased ROM          Muscle Tone/Strength: Functionally intact. No obvious neuro-muscular anomalies detected.  Muscle Tone/Strength: Functionally intact. No obvious neuro-muscular anomalies detected.  Sensory (Neurological): Unimpaired  Sensory (Neurological): Unimpaired  Palpation: Complains of area being tender to palpation  Palpation: Complains of area being tender to palpation   Assessment  Primary Diagnosis & Pertinent Problem List: The primary encounter diagnosis was Chronic pain of both knees. Diagnoses of Chronic midline low back pain  without sciatica, Osteoarthritis of multiple joints, unspecified osteoarthritis type, Chronic pain syndrome, and Long term prescription opiate use were also pertinent to this visit.  Status Diagnosis  Persistent Stable Persistent 1. Chronic pain of both knees   2. Chronic midline low back pain without sciatica   3. Osteoarthritis of multiple joints, unspecified osteoarthritis type   4. Chronic pain syndrome   5. Long term prescription opiate use     Problems updated and reviewed during this visit: No problems updated. Plan of Care  Pharmacotherapy (Medications Ordered): Meds ordered this encounter  Medications  . oxyCODONE-acetaminophen (PERCOCET) 7.5-325 MG tablet    Sig: Take 1 tablet by mouth every 8 (eight) hours as needed for moderate pain or severe pain.    Dispense:  90 tablet    Refill:  0    Do not place this medication, or any other prescription from our practice, on "Automatic Refill". Patient may have prescription filled one day early if pharmacy is closed on scheduled refill date. Do not fill until: :07/06/2017 To last until: 08/05/2017    Order Specific Question:   Supervising Provider    Answer:   Milinda Pointer [phone]  . oxyCODONE-acetaminophen (PERCOCET) 7.5-325 MG tablet    Sig: Take 1 tablet by mouth every 8 (eight) hours as needed for moderate pain or severe pain.    Dispense:  90 tablet    Refill:  0    Do not place this medication, or any other prescription from our practice, on "Automatic Refill". Patient may have prescription filled one day early if pharmacy is closed on scheduled refill date. Do not fill until: 06/06/2017 To last until:07/06/2017    Order Specific Question:   Supervising Provider    Answer:   Milinda Pointer [phone]   New Prescriptions   OXYCODONE-ACETAMINOPHEN (PERCOCET) 7.5-325 MG TABLET    Take 1 tablet by mouth every 8 (eight) hours as needed for moderate pain or severe pain.   Medications administered today: [name]  had no medications administered during this visit. Lab-work, procedure(s), and/or referral(s): Orders Placed This Encounter  Procedures  . ToxASSURE Select 13 (MW), Urine   Imaging and/or referral(s): None  Interventional therapies: Planned, scheduled, and/or pending:   Not at this time. Declines possible genicular nerve block  Provider-requested follow-up: Return in about 2 months (around 07/27/2017) for MedMgmt with Me Donella Stade Edison Pace).  Future Appointments  Date Time Provider [place]  06/03/2017  1:45 PM Hayden Pedro, MD [facility] None  07/25/2017  1:30 PM Vevelyn Francois, NP [facility] None  08/05/2017 11:20 AM  [name], MD [facility]   Primary Care Physician: [name], MD Location: [facility] Outpatient Pain Management Facility Note by: Vevelyn Francois NP Date: 05/27/2017; Time: 3:26 PM  Pain Score Disclaimer: We use the NRS-11 scale. This is a self-reported, subjective measurement of pain severity with only modest accuracy. It is used primarily to identify changes within a particular patient. It must be understood that outpatient pain scales are significantly less accurate that those used for research, where they can be applied under ideal controlled circumstances with minimal exposure to variables. In reality, the score is likely to be a combination of pain intensity and pain affect, where pain affect describes the degree of emotional arousal or changes in action readiness caused by the sensory experience of pain. Factors such as social and work situation, setting, emotional state, anxiety levels, expectation, and prior pain experience may influence pain perception and show large inter-individual differences that may also be affected by time variables.  Patient instructions provided during this appointment: Patient Instructions   ____________________________________________________________________________________________  Medication Rules  Applies to: All  patients receiving prescriptions (written or electronic).  Pharmacy of record: Pharmacy where electronic prescriptions will be sent. If written prescriptions are taken to a different pharmacy, please inform the nursing staff. The pharmacy listed in the electronic medical record should be the one where you would like electronic prescriptions to be sent.  Prescription refills: Only during scheduled appointments. Applies to both, written and electronic prescriptions.  NOTE: The following applies primarily to controlled substances (Opioid* Pain Medications).   Patient's responsibilities: 1. Pain Pills: Bring all pain pills to every appointment (except for procedure appointments). 2. Pill Bottles: Bring pills in original pharmacy bottle. Always bring newest bottle. Bring bottle, even if empty. 3. Medication refills: You are responsible for knowing and keeping track of what medications you need refilled. The day before your appointment, write a list of all prescriptions that need to be refilled. Bring that list to your appointment and give it to the admitting nurse. Prescriptions will be written only during appointments. If you forget a medication, it will not be "Called in", "Faxed", or "electronically sent". You will need to get another appointment to get these prescribed. 4. Prescription Accuracy: You are responsible for carefully inspecting your prescriptions before leaving our office. Have the discharge nurse carefully go over each prescription with you, before taking them home. Make sure that your name is accurately spelled, that your address is correct. Check the name and dose of your medication to make sure it is accurate. Check the number of pills, and the written instructions to make sure they are clear and accurate. Make sure that you are given enough medication to last until your next medication refill appointment. 5. Taking Medication: Take medication as prescribed. Never take more pills than  instructed. Never take medication more frequently than prescribed. Taking less pills or less frequently is permitted and encouraged, when it comes to controlled substances (written prescriptions).  6. Inform other Doctors: Always inform, all of your healthcare providers, of all the medications you take. 7. Pain Medication from other Providers: You are not allowed to accept any additional pain medication from any other Doctor or Healthcare provider. There are two exceptions to this rule. (see below) In the event that you require additional pain medication, you are responsible for notifying us, as stated below. 8. Medication Agreement: You are responsible for carefully reading and following our Medication Agreement. This must be signed before receiving any prescriptions from our practice.  Safely store a copy of your signed Agreement. Violations to the Agreement will result in no further prescriptions. (Additional copies of our Medication Agreement are available upon request.) 9. Laws, Rules, & Regulations: All patients are expected to follow all Federal and [employer], [employer], Rules, [employer]. Ignorance of the Laws does not constitute a valid excuse. The use of any illegal substances is prohibited. 10. Adopted CDC guidelines & recommendations: Target dosing levels will be at or below 60 MME/day. Use of benzodiazepines** is not recommended.  Exceptions: There are only two exceptions to the rule of not receiving pain medications from other Healthcare Providers. 1. Exception #1 (Emergencies): In the event of an emergency (i.e.: accident requiring emergency care), you are allowed to receive additional pain medication. However, you are responsible for: As soon as you are able, call our office [phone], at any time of the day or night, and leave a message stating your name, the date and nature of the emergency, and the name and dose of the medication prescribed. In the event that your call is answered  by a member of our staff, make sure to document and save the date, time, and the name of the person that took your information.  2. Exception #2 (Planned Surgery): In the event that you are scheduled by another doctor or dentist to have any type of surgery or procedure, you are allowed (for a period no longer than 30 days), to receive additional pain medication, for the acute post-op pain. However, in this case, you are responsible for picking up a copy of our "Post-op Pain Management for Surgeons" handout, and giving it to your surgeon or dentist. This document is available at our office, and does not require an appointment to obtain it. Simply go to our office during business hours (Monday-Thursday from 8:00 AM to 4:00 PM) (Friday 8:00 AM to 12:00 Noon) or if you have a scheduled appointment with [place], prior to your surgery, and ask for it by name. In addition, you will need to provide [place] with your name, name of your surgeon, type of surgery, and date of procedure or surgery.  *Opioid medications include: morphine, codeine, oxycodone, oxymorphone, hydrocodone, hydromorphone, meperidine, tramadol, tapentadol, buprenorphine, fentanyl, methadone. **Benzodiazepine medications include: diazepam (Valium), alprazolam (Xanax), clonazepam (Klonopine), lorazepam (Ativan), clorazepate (Tranxene), chlordiazepoxide (Librium), estazolam (Prosom), oxazepam (Serax), temazepam (Restoril), triazolam (Halcion) (Last updated: 04/18/2017) ____________________________________________________________________________________________    BMI Assessment: Estimated body mass index is 40.25 kg/m as calculated from the following:   Height as of this encounter: 5' 1"  (1.549 m).   Weight as of this encounter: 213 lb (96.6 kg).  BMI interpretation table: BMI level Category Range association with higher incidence of chronic pain  <18 kg/m2 Underweight   18.5-24.9 kg/m2 Ideal body weight   25-29.9 kg/m2 Overweight Increased  incidence by 20%  30-34.9 kg/m2 Obese (Class I) Increased incidence by 68%  35-39.9 kg/m2 Severe obesity (Class II) Increased incidence by 136%  >40 kg/m2 Extreme obesity (Class III) Increased incidence by 254%   BMI Readings from Last 4 Encounters:  05/27/17 40.25 kg/m  04/03/17 40.40 kg/m  03/21/17 40.15 kg/m  03/04/17 40.62 kg/m   Wt Readings from Last 4 Encounters:  05/27/17 213 lb (96.6 kg)  04/03/17 213 lb 12.8 oz (97 kg)  03/21/17 212 lb 8 oz (96.4 kg)  03/04/17 215 lb (97.5 kg)   You were given 2 prescriptions for Percocet.

## 2017-05-27 NOTE — Patient Instructions (Addendum)
____________________________________________________________________________________________  Medication Rules  Applies to: All patients receiving prescriptions (written or electronic).  Pharmacy of record: Pharmacy where electronic prescriptions will be sent. If written prescriptions are taken to a different pharmacy, please inform the nursing staff. The pharmacy listed in the electronic medical record should be the one where you would like electronic prescriptions to be sent.  Prescription refills: Only during scheduled appointments. Applies to both, written and electronic prescriptions.  NOTE: The following applies primarily to controlled substances (Opioid* Pain Medications).   Patient's responsibilities: 1. Pain Pills: Bring all pain pills to every appointment (except for procedure appointments). 2. Pill Bottles: Bring pills in original pharmacy bottle. Always bring newest bottle. Bring bottle, even if empty. 3. Medication refills: You are responsible for knowing and keeping track of what medications you need refilled. The day before your appointment, write a list of all prescriptions that need to be refilled. Bring that list to your appointment and give it to the admitting nurse. Prescriptions will be written only during appointments. If you forget a medication, it will not be "Called in", "Faxed", or "electronically sent". You will need to get another appointment to get these prescribed. 4. Prescription Accuracy: You are responsible for carefully inspecting your prescriptions before leaving our office. Have the discharge nurse carefully go over each prescription with you, before taking them home. Make sure that your name is accurately spelled, that your address is correct. Check the name and dose of your medication to make sure it is accurate. Check the number of pills, and the written instructions to make sure they are clear and accurate. Make sure that you are given enough medication to last  until your next medication refill appointment. 5. Taking Medication: Take medication as prescribed. Never take more pills than instructed. Never take medication more frequently than prescribed. Taking less pills or less frequently is permitted and encouraged, when it comes to controlled substances (written prescriptions).  6. Inform other Doctors: Always inform, all of your healthcare providers, of all the medications you take. 7. Pain Medication from other Providers: You are not allowed to accept any additional pain medication from any other Doctor or Healthcare provider. There are two exceptions to this rule. (see below) In the event that you require additional pain medication, you are responsible for notifying us, as stated below. 8. Medication Agreement: You are responsible for carefully reading and following our Medication Agreement. This must be signed before receiving any prescriptions from our practice. Safely store a copy of your signed Agreement. Violations to the Agreement will result in no further prescriptions. (Additional copies of our Medication Agreement are available upon request.) 9. Laws, Rules, & Regulations: All patients are expected to follow all Federal and State Laws, Statutes, Rules, & Regulations. Ignorance of the Laws does not constitute a valid excuse. The use of any illegal substances is prohibited. 10. Adopted CDC guidelines & recommendations: Target dosing levels will be at or below 60 MME/day. Use of benzodiazepines** is not recommended.  Exceptions: There are only two exceptions to the rule of not receiving pain medications from other Healthcare Providers. 1. Exception #1 (Emergencies): In the event of an emergency (i.e.: accident requiring emergency care), you are allowed to receive additional pain medication. However, you are responsible for: As soon as you are able, call our office [phone], at any time of the day or night, and leave a message stating your name, the  date and nature of the emergency, and the name and dose of the medication   prescribed. In the event that your call is answered by a member of our staff, make sure to document and save the date, time, and the name of the person that took your information.  2. Exception #2 (Planned Surgery): In the event that you are scheduled by another doctor or dentist to have any type of surgery or procedure, you are allowed (for a period no longer than 30 days), to receive additional pain medication, for the acute post-op pain. However, in this case, you are responsible for picking up a copy of our "Post-op Pain Management for Surgeons" handout, and giving it to your surgeon or dentist. This document is available at our office, and does not require an appointment to obtain it. Simply go to our office during business hours (Monday-Thursday from 8:00 AM to 4:00 PM) (Friday 8:00 AM to 12:00 Noon) or if you have a scheduled appointment with [place], prior to your surgery, and ask for it by name. In addition, you will need to provide [place] with your name, name of your surgeon, type of surgery, and date of procedure or surgery.  *Opioid medications include: morphine, codeine, oxycodone, oxymorphone, hydrocodone, hydromorphone, meperidine, tramadol, tapentadol, buprenorphine, fentanyl, methadone. **Benzodiazepine medications include: diazepam (Valium), alprazolam (Xanax), clonazepam (Klonopine), lorazepam (Ativan), clorazepate (Tranxene), chlordiazepoxide (Librium), estazolam (Prosom), oxazepam (Serax), temazepam (Restoril), triazolam (Halcion) (Last updated: 04/18/2017) ____________________________________________________________________________________________    BMI Assessment: Estimated body mass index is 40.25 kg/m as calculated from the following:   Height as of this encounter: 5\' 1"  (1.549 m).   Weight as of this encounter: 213 lb (96.6 kg).  BMI interpretation table: BMI level Category Range association with higher  incidence of chronic pain  <18 kg/m2 Underweight   18.5-24.9 kg/m2 Ideal body weight   25-29.9 kg/m2 Overweight Increased incidence by 20%  30-34.9 kg/m2 Obese (Class I) Increased incidence by 68%  35-39.9 kg/m2 Severe obesity (Class II) Increased incidence by 136%  >40 kg/m2 Extreme obesity (Class III) Increased incidence by 254%   BMI Readings from Last 4 Encounters:  05/27/17 40.25 kg/m  04/03/17 40.40 kg/m  03/21/17 40.15 kg/m  03/04/17 40.62 kg/m   Wt Readings from Last 4 Encounters:  05/27/17 213 lb (96.6 kg)  04/03/17 213 lb 12.8 oz (97 kg)  03/21/17 212 lb 8 oz (96.4 kg)  03/04/17 215 lb (97.5 kg)   You were given 2 prescriptions for Percocet.

## 2017-06-01 LAB — TOXASSURE SELECT 13 (MW), URINE

## 2017-06-03 ENCOUNTER — Encounter (INDEPENDENT_AMBULATORY_CARE_PROVIDER_SITE_OTHER): Payer: Medicare Other | Admitting: Ophthalmology

## 2017-06-03 DIAGNOSIS — I10 Essential (primary) hypertension: Secondary | ICD-10-CM

## 2017-06-03 DIAGNOSIS — H35342 Macular cyst, hole, or pseudohole, left eye: Secondary | ICD-10-CM | POA: Diagnosis not present

## 2017-06-03 DIAGNOSIS — H43813 Vitreous degeneration, bilateral: Secondary | ICD-10-CM

## 2017-06-03 DIAGNOSIS — H35033 Hypertensive retinopathy, bilateral: Secondary | ICD-10-CM | POA: Diagnosis not present

## 2017-06-03 LAB — HM DIABETES EYE EXAM

## 2017-06-05 ENCOUNTER — Encounter: Payer: Self-pay | Admitting: Family Medicine

## 2017-06-06 ENCOUNTER — Encounter: Payer: Self-pay | Admitting: Family Medicine

## 2017-06-10 ENCOUNTER — Ambulatory Visit (INDEPENDENT_AMBULATORY_CARE_PROVIDER_SITE_OTHER): Payer: Medicare Other | Admitting: Ophthalmology

## 2017-06-17 ENCOUNTER — Other Ambulatory Visit: Payer: Self-pay | Admitting: Family Medicine

## 2017-06-17 DIAGNOSIS — I10 Essential (primary) hypertension: Secondary | ICD-10-CM

## 2017-06-20 ENCOUNTER — Other Ambulatory Visit: Payer: Self-pay | Admitting: Family Medicine

## 2017-06-20 DIAGNOSIS — I10 Essential (primary) hypertension: Secondary | ICD-10-CM

## 2017-06-20 NOTE — Telephone Encounter (Signed)
Hypertension medication request: Amlodipine  Last office visit pertaining to hypertension: 04/03/17   BP Readings from Last 3 Encounters:  05/27/17 (!) 174/72  04/03/17 140/70  03/21/17 132/72    Lab Results  Component Value Date   CREATININE 1.26 (H) 03/21/2017   BUN 23 03/21/2017   NA 139 03/21/2017   K 4.4 03/21/2017   CL 105 03/21/2017   CO2 24 03/21/2017    Follow up on 08/05/2017

## 2017-06-22 ENCOUNTER — Other Ambulatory Visit: Payer: Self-pay | Admitting: Family Medicine

## 2017-06-22 DIAGNOSIS — R809 Proteinuria, unspecified: Principal | ICD-10-CM

## 2017-06-22 DIAGNOSIS — E1165 Type 2 diabetes mellitus with hyperglycemia: Secondary | ICD-10-CM

## 2017-06-22 DIAGNOSIS — E1129 Type 2 diabetes mellitus with other diabetic kidney complication: Secondary | ICD-10-CM

## 2017-06-22 DIAGNOSIS — IMO0002 Reserved for concepts with insufficient information to code with codable children: Secondary | ICD-10-CM

## 2017-06-24 ENCOUNTER — Other Ambulatory Visit: Payer: Self-pay

## 2017-06-24 DIAGNOSIS — I10 Essential (primary) hypertension: Secondary | ICD-10-CM

## 2017-06-24 MED ORDER — OLMESARTAN MEDOXOMIL 20 MG PO TABS: 20.0000 mg | ORAL_TABLET | Freq: Every day | ORAL | 0 refills | Status: DC

## 2017-06-24 NOTE — Telephone Encounter (Signed)
Hypertension medication request: Olmesartan to [facility]  Last office visit pertaining to hypertension: 04/03/2017   BP Readings from Last 3 Encounters:  05/27/17 (!) 174/72  04/03/17 140/70  03/21/17 132/72    Lab Results  Component Value Date   CREATININE 1.26 (H) 03/21/2017   BUN 23 03/21/2017   NA 139 03/21/2017   K 4.4 03/21/2017   CL 105 03/21/2017   CO2 24 03/21/2017    Follow up 08/05/2017

## 2017-06-25 ENCOUNTER — Telehealth: Payer: Self-pay | Admitting: Family Medicine

## 2017-06-25 NOTE — Telephone Encounter (Signed)
Copied from [place] [id number]. Topic: General - Other >> Jun 25, 2017  1:45 PM Margot Ables wrote: Reason for CRM: pt has been discharged from personal care services due to no longer having Medicaid to cover services.

## 2017-06-29 ENCOUNTER — Other Ambulatory Visit: Payer: Self-pay | Admitting: Family Medicine

## 2017-06-29 DIAGNOSIS — E782 Mixed hyperlipidemia: Secondary | ICD-10-CM

## 2017-07-24 ENCOUNTER — Other Ambulatory Visit: Payer: Self-pay | Admitting: Family Medicine

## 2017-07-24 DIAGNOSIS — R809 Proteinuria, unspecified: Principal | ICD-10-CM

## 2017-07-24 DIAGNOSIS — E1129 Type 2 diabetes mellitus with other diabetic kidney complication: Secondary | ICD-10-CM

## 2017-07-24 DIAGNOSIS — E1165 Type 2 diabetes mellitus with hyperglycemia: Secondary | ICD-10-CM

## 2017-07-24 DIAGNOSIS — IMO0002 Reserved for concepts with insufficient information to code with codable children: Secondary | ICD-10-CM

## 2017-07-24 NOTE — Telephone Encounter (Signed)
Refill request for diabetic medication:   Metformin 500 mg  Last office visit pertaining to diabetes: 04/03/2017   Lab Results  Component Value Date   HGBA1C 7.2 04/03/2017    Follow-ups on file. 08/05/2017

## 2017-07-25 ENCOUNTER — Ambulatory Visit: Payer: Medicare Other | Attending: Nurse Practitioner | Admitting: Nurse Practitioner

## 2017-07-25 ENCOUNTER — Encounter: Payer: Self-pay | Admitting: Nurse Practitioner

## 2017-07-25 ENCOUNTER — Other Ambulatory Visit: Payer: Self-pay

## 2017-07-25 VITALS — BP 168/79 | HR 70 | Temp 98.0°F | Resp 18 | Ht 61.0 in | Wt 213.0 lb

## 2017-07-25 DIAGNOSIS — Z6841 Body Mass Index (BMI) 40.0 and over, adult: Secondary | ICD-10-CM | POA: Diagnosis not present

## 2017-07-25 DIAGNOSIS — M79641 Pain in right hand: Secondary | ICD-10-CM | POA: Insufficient documentation

## 2017-07-25 DIAGNOSIS — M1990 Unspecified osteoarthritis, unspecified site: Secondary | ICD-10-CM | POA: Diagnosis not present

## 2017-07-25 DIAGNOSIS — Z79891 Long term (current) use of opiate analgesic: Secondary | ICD-10-CM | POA: Diagnosis not present

## 2017-07-25 DIAGNOSIS — G8929 Other chronic pain: Secondary | ICD-10-CM

## 2017-07-25 DIAGNOSIS — M79642 Pain in left hand: Secondary | ICD-10-CM | POA: Insufficient documentation

## 2017-07-25 DIAGNOSIS — Z5181 Encounter for therapeutic drug level monitoring: Secondary | ICD-10-CM | POA: Diagnosis present

## 2017-07-25 DIAGNOSIS — E669 Obesity, unspecified: Secondary | ICD-10-CM | POA: Diagnosis not present

## 2017-07-25 DIAGNOSIS — M25561 Pain in right knee: Secondary | ICD-10-CM

## 2017-07-25 DIAGNOSIS — M545 Low back pain, unspecified: Secondary | ICD-10-CM

## 2017-07-25 DIAGNOSIS — I10 Essential (primary) hypertension: Secondary | ICD-10-CM | POA: Diagnosis not present

## 2017-07-25 DIAGNOSIS — E119 Type 2 diabetes mellitus without complications: Secondary | ICD-10-CM | POA: Insufficient documentation

## 2017-07-25 DIAGNOSIS — E785 Hyperlipidemia, unspecified: Secondary | ICD-10-CM | POA: Diagnosis not present

## 2017-07-25 DIAGNOSIS — H401133 Primary open-angle glaucoma, bilateral, severe stage: Secondary | ICD-10-CM | POA: Diagnosis not present

## 2017-07-25 DIAGNOSIS — D509 Iron deficiency anemia, unspecified: Secondary | ICD-10-CM | POA: Diagnosis not present

## 2017-07-25 DIAGNOSIS — Z7984 Long term (current) use of oral hypoglycemic drugs: Secondary | ICD-10-CM | POA: Diagnosis not present

## 2017-07-25 DIAGNOSIS — M159 Polyosteoarthritis, unspecified: Secondary | ICD-10-CM | POA: Diagnosis not present

## 2017-07-25 DIAGNOSIS — I700 Atherosclerosis of aorta: Secondary | ICD-10-CM | POA: Diagnosis not present

## 2017-07-25 DIAGNOSIS — Z79899 Other long term (current) drug therapy: Secondary | ICD-10-CM | POA: Insufficient documentation

## 2017-07-25 DIAGNOSIS — G894 Chronic pain syndrome: Secondary | ICD-10-CM | POA: Diagnosis not present

## 2017-07-25 MED ORDER — OXYCODONE-ACETAMINOPHEN 7.5-325 MG PO TABS: 1.0000 | ORAL_TABLET | Freq: Three times a day (TID) | ORAL | 0 refills | Status: DC | PRN

## 2017-07-25 NOTE — Progress Notes (Signed)
Patient's Name: Taylor [name]  MRN: [id number]  Referring Provider: [name], MD  DOB: 11-Nov-1932  PCP: [name], MD  DOS: 07/25/2017  Note by: Vevelyn Francois NP  Service setting: Ambulatory outpatient  Specialty: Interventional Pain Management  Location: [facility] (AMB) Pain Management Facility    Patient type: Established    Primary Reason(s) for Visit: Encounter for prescription drug management. (Level of risk: moderate)  CC: Hand Pain (bilateral) and Knee Pain (right)  HPI  Taylor [name] is a 84 y.o. year old, female patient, who comes today for a medication management evaluation. She has Lump or mass in breast; Umbilical hernia; Osteoarthritis, multiple sites; Type 2 diabetes mellitus with microalbuminuria ([place]); Glaucoma; BP (high blood pressure); Chronic pain; Tinea corporis; Calculus of kidney; Vaginal dryness; Dyslipidemia associated with type 2 diabetes mellitus ([place]); Chronic midline low back pain without sciatica; Chronic pain of right knee; Long term current use of opiate analgesic; Primary open angle glaucoma (POAG) of both eyes, severe stage; Hypertensive retinopathy; Vitamin D deficiency; Anemia, unspecified; Chronic pain syndrome; Aortic atherosclerosis ([place]); Anterolisthesis; Iron deficiency anemia; Assistance needed with transportation; Osteoporotic compression fracture of spine ([place]); and HLD (hyperlipidemia) on their problem list. Her primarily concern today is the Hand Pain (bilateral) and Knee Pain (right)  Pain Assessment: Location: Right, Left Hand Radiating: denies Onset: More than a month ago Duration: Chronic pain Quality: Aching, Throbbing Severity: 8 /10 (subjective, self-reported pain score)  Note: Reported level is compatible with observation. Clinically the patient looks like a 2/10 A 2/10 is viewed as "Mild to Moderate" and described as noticeable and distracting. Impossible to hide from other people. More frequent flare-ups. Still possible to adapt and  function close to normal. It can be very annoying and may have occasional stronger flare-ups. With discipline, patients may get used to it and adapt. Information on the proper use of the pain scale provided to the patient today. When using our objective Pain Scale, levels between 6 and 10/10 are said to belong in an emergency room, as it progressively worsens from a 6/10, described as severely limiting, requiring emergency care not usually available at an outpatient pain management facility. At a 6/10 level, communication becomes difficult and requires great effort. Assistance to reach the emergency department may be required. Facial flushing and profuse sweating along with potentially dangerous increases in heart rate and blood pressure will be evident. Timing: Intermittent Modifying factors: ice, heat, ortho gloves BP: (!) 168/79  HR: 70  Taylor [name] was last scheduled for an appointment on 05/27/2017 for medication management. During today's appointment we reviewed Taylor [name]'s chronic pain status, as well as her outpatient medication regimen. She admits that her OA in her hands is getting worse. She admits that she can not cut her steak. She admits that the right hand is worse than the left. She also has the chronic knee pain that she is aware will not improve. She declines any injections. She states that she has a fear of needles.  She denies any nausea or constipation.  The patient  reports that she does not use drugs. Her body mass index is 40.25 kg/m.  Further details on both, my assessment(s), as well as the proposed treatment plan, please see below.  Controlled Substance Pharmacotherapy Assessment REMS (Risk Evaluation and Mitigation Strategy)  Analgesic:Oxycodone/acetaminophen 7.5/325 mg 3times daily MME/day:33.33m/day.   WLandis Martins RN  07/25/2017  1:50 PM  Sign at close encounter Nursing Pain Medication Assessment:  Safety precautions to be maintained throughout  the outpatient  stay will include: orient to surroundings, keep bed in low position, maintain call bell within reach at all times, provide assistance with transfer out of bed and ambulation.  Medication Inspection Compliance: Pill count conducted under aseptic conditions, in front of the patient. Neither the pills nor the bottle was removed from the patient's sight at any time. Once count was completed pills were immediately returned to the patient in their original bottle.  Medication: Oxycodone/APAP Pill/Patch Count: 41  of 90 pills remain Pill/Patch Appearance: Markings consistent with prescribed medication Bottle Appearance: Standard pharmacy container. Clearly labeled. Filled Date:05/18/ 2019 Last Medication intake:  Today   Pharmacokinetics: Liberation and absorption (onset of action): WNL Distribution (time to peak effect): WNL Metabolism and excretion (duration of action): WNL         Pharmacodynamics: Desired effects: Analgesia: Taylor [name] reports >50% benefit. Functional ability: Patient reports that medication allows her to accomplish basic ADLs Clinically meaningful improvement in function (CMIF): Sustained CMIF goals met Perceived effectiveness: Described as relatively effective, allowing for increase in activities of daily living (ADL) Undesirable effects: Side-effects or Adverse reactions: None reported Monitoring: [place] PMP: Online review of the past 35-monthperiod conducted. Compliant with practice rules and regulations Last UDS on record: Summary  Date Value Ref Range Status  05/27/2017 FINAL  Final    Comment:    ==================================================================== TOXASSURE SELECT 13 (MW) ==================================================================== Test                             Result       Flag       Units Drug Present and Declared for Prescription Verification   Oxycodone                      494          EXPECTED   ng/mg creat   Oxymorphone                     164          EXPECTED   ng/mg creat   Noroxycodone                   540          EXPECTED   ng/mg creat    Sources of oxycodone include scheduled prescription medications.    Oxymorphone and noroxycodone are expected metabolites of    oxycodone. Oxymorphone is also available as a scheduled    prescription medication. ==================================================================== Test                      Result    Flag   Units      Ref Range   Creatinine              87               mg/dL      >=20 ==================================================================== Declared Medications:  The flagging and interpretation on this report are based on the  following declared medications.  Unexpected results may arise from  inaccuracies in the declared medications.  **Note: The testing scope of this panel includes these medications:  Oxycodone (Percocet)  **Note: The testing scope of this panel does not include following  reported medications:  Acetaminophen  Acetaminophen (Percocet)  Alendronate (Fosamax)  Amlodipine (Norvasc)  Besifloxacin (Besivance)  Bimatoprost (Lumigan)  Brimonidine Tartrate  Cholecalciferol  Estrogen (  Premarin)  Fluticasone (Flonase)  Ketoconazole (Nizoral)  Metformin (Glucophage)  Olmesartan (Benicar)  Omega-3 Fatty Acids (Lovaza)  Pravastatin (Pravachol)  Ranitidine (Zantac)  Timolol  Vitamin D2 (Drisdol) ==================================================================== For clinical consultation, please call [phone]. ====================================================================    UDS interpretation: Compliant          Medication Assessment Form: Reviewed. Patient indicates being compliant with therapy Treatment compliance: Compliant Risk Assessment Profile: Aberrant behavior: See prior evaluations. None observed or detected today Comorbid factors increasing risk of overdose: See prior notes. No additional risks  detected today Risk of substance use disorder (SUD): Low  ORT Scoring interpretation table:  Score <3 = Low Risk for SUD  Score between 4-7 = Moderate Risk for SUD  Score >8 = High Risk for Opioid Abuse   Risk Mitigation Strategies:  Patient Counseling: Covered Patient-Prescriber Agreement (PPA): Present and active  Notification to other healthcare providers: Done  Pharmacologic Plan: No change in therapy, at this time.             Laboratory Chemistry  Inflammation Markers (CRP: Acute Phase) (ESR: Chronic Phase) No results found for: CRP, ESRSEDRATE, LATICACIDVEN                       Rheumatology Markers No results found for: RF, ANA, LABURIC, URICUR, LYMEIGGIGMAB, LYMEABIGMQN, HLAB27                      Renal Function Markers Lab Results  Component Value Date   BUN 23 03/21/2017   CREATININE 1.26 (H) 03/21/2017   BCR 18 03/21/2017   GFRAA 46 (L) 03/21/2017   GFRNONAA 40 (L) 03/21/2017                              Hepatic Function Markers Lab Results  Component Value Date   AST 9 (L) 08/14/2016   ALT 6 08/14/2016   ALBUMIN 4.0 08/14/2016   ALKPHOS 56 08/14/2016                        Electrolytes Lab Results  Component Value Date   NA 139 03/21/2017   K 4.4 03/21/2017   CL 105 03/21/2017   CALCIUM 9.3 03/21/2017                        Neuropathy Markers Lab Results  Component Value Date   HGBA1C 7.2 04/03/2017                        Bone Pathology Markers Lab Results  Component Value Date   VD25OH 16 (L) 08/14/2016                         Coagulation Parameters Lab Results  Component Value Date   PLT 346 03/21/2017                        Cardiovascular Markers Lab Results  Component Value Date   HGB 11.4 (L) 03/21/2017   HCT 35.1 03/21/2017                         CA Markers No results found for: CEA, CA125, LABCA2  Note: Lab results reviewed.  Recent Diagnostic Imaging Results   Complexity Note: Imaging results  reviewed. Results shared with Taylor [name], using [employer].                         Meds   Current Outpatient Medications:  .  acetaminophen (TYLENOL) 500 MG tablet, Take 1 tablet (500 mg total) by mouth 3 (three) times daily. (Patient taking differently: Take 500 mg by mouth as needed. ), Disp: 90 tablet, Rfl: 0 .  alendronate (FOSAMAX) 70 MG tablet, TAKE 1 TABLET WEEKLY, Disp: 12 tablet, Rfl: 1 .  amLODipine (NORVASC) 10 MG tablet, TAKE 1 TABLET BY MOUTH EVERY DAY, Disp: 90 tablet, Rfl: 0 .  BESIVANCE 0.6 % SUSP, 1 DROP IN SURGICAL EYE TWICE A DAY STARTING 1 DAY BEFORE SURGERY, Disp: , Rfl: 1 .  bimatoprost (LUMIGAN) 0.01 % SOLN, Place 1 drop into both eyes daily. , Disp: , Rfl:  .  brimonidine-timolol (COMBIGAN) 0.2-0.5 % ophthalmic solution, Place 1 drop into both eyes See admin instructions. 1 drop into both eyes in the morning, then 1 drop into both eyes 8 hours later, Disp: , Rfl:  .  Cholecalciferol (VITAMIN D-1000 MAX ST) 1000 UNITS tablet, Take 1,000 Units by mouth daily. Reported on 08/05/2015, Disp: , Rfl:  .  conjugated estrogens (PREMARIN) vaginal cream, Place 1 Applicatorful vaginally daily., Disp: 42.5 g, Rfl: 5 .  fluticasone (FLONASE) 50 MCG/ACT nasal spray, SPRAY 2 SPRAYS INTO EACH NOSTRIL EVERY DAY, Disp: 16 g, Rfl: 1 .  glucose blood test strip, Use as instructed, Disp: 100 each, Rfl: 12 .  metFORMIN (GLUCOPHAGE) 500 MG tablet, TAKE 1 TABLET (500 MG TOTAL) BY MOUTH 2 (TWO) TIMES DAILY WITH A MEAL., Disp: 60 tablet, Rfl: 0 .  olmesartan (BENICAR) 20 MG tablet, Take 1 tablet (20 mg total) by mouth daily., Disp: 90 tablet, Rfl: 0 .  omega-3 acid ethyl esters (LOVAZA) 1 g capsule, TAKE 1 CAPSULE (1 G TOTAL) BY MOUTH 2 (TWO) TIMES DAILY., Disp: 180 capsule, Rfl: 1 .  ONETOUCH DELICA LANCETS FINE MISC, 1 each by Does not apply route daily. 30 G Lancets- One Touch Delica, Disp: 161 each, Rfl: 1 .  [START ON 09/04/2017] oxyCODONE-acetaminophen (PERCOCET) 7.5-325 MG tablet, Take 1  tablet by mouth every 8 (eight) hours as needed for moderate pain or severe pain., Disp: 90 tablet, Rfl: 0 .  [START ON 08/05/2017] oxyCODONE-acetaminophen (PERCOCET) 7.5-325 MG tablet, Take 1 tablet by mouth every 8 (eight) hours as needed for moderate pain or severe pain., Disp: 90 tablet, Rfl: 0 .  pravastatin (PRAVACHOL) 40 MG tablet, TAKE 1 TABLET (40 MG TOTAL) BY MOUTH AT BEDTIME., Disp: 90 tablet, Rfl: 1 .  ranitidine (ZANTAC) 150 MG tablet, TAKE 1 TABLET BY MOUTH DAILY AS NEEDED FOR HEARTBURN, *TAKE THIS IN PLACCE OF OMEPRAZOLE*, Disp: 90 tablet, Rfl: 1 .  Vitamin D, Ergocalciferol, (DRISDOL) 50000 units CAPS capsule, TAKE 1 CAPSULE (50,000 UNITS TOTAL) BY MOUTH EVERY 7 (SEVEN) DAYS., Disp: 12 capsule, Rfl: 0  ROS  Constitutional: Denies any fever or chills Gastrointestinal: No reported hemesis, hematochezia, vomiting, or acute GI distress Musculoskeletal: Denies any acute onset joint swelling, redness, loss of ROM, or weakness Neurological: No reported episodes of acute onset apraxia, aphasia, dysarthria, agnosia, amnesia, paralysis, loss of coordination, or loss of consciousness  Allergies  Taylor [name] is allergic to ace inhibitors.  [place]  Drug: Taylor [name]  reports that she does not use drugs.  Alcohol:  reports that she does not drink alcohol. Tobacco:  reports that she has never smoked. She has never used smokeless tobacco. Medical:  has a past medical history of Abnormal mammogram, unspecified (2013), Breast screening, unspecified (2013), Diabetes mellitus without complication ([place]) (1840), GERD (gastroesophageal reflux disease), Glaucoma (2003), Gout, Hyperlipidemia (2008), Hypertension (1980's), Lump or mass in breast (01/03/2012), Obesity, unspecified (2013), Osteoporosis, Shingles (2013), and Special screening for malignant neoplasms, colon (2013). Surgical: Taylor [name]  has a past surgical history that includes Abdominal hysterectomy (1958); Knee surgery (2009,2011,2012); Lipoma  excision (1998); Spine surgery (2004); Tonsillectomy; Colonoscopy (2003); Coronary artery bypass graft (Left, 01/03/2012,01/24/2012); Cataract extraction, bilateral (Bilateral); Cholecystectomy; Back surgery; and Knee surgery. Family: family history includes Cancer in her mother; Heart disease in her father; Stroke in her mother.  Constitutional Exam  General appearance: Well nourished, well developed, and well hydrated. In no apparent acute distress Vitals:   07/25/17 1343  BP: (!) 168/79  Pulse: 70  Resp: 18  Temp: 98 F (36.7 C)  TempSrc: Oral  SpO2: 100%  Weight: 213 lb (96.6 kg)  Height: 5' 1"  (1.549 m)  Psych/Mental status: Alert, oriented x 3 (person, place, & time)       Eyes: PERLA Respiratory: No evidence of acute respiratory distress  Cervical Spine Area Exam  Skin & Axial Inspection: No masses, redness, edema, swelling, or associated skin lesions Alignment: Symmetrical Functional ROM: Unrestricted ROM      Stability: No instability detected Muscle Tone/Strength: Functionally intact. No obvious neuro-muscular anomalies detected. Sensory (Neurological): Unimpaired Palpation: No palpable anomalies              Upper Extremity (UE) Exam    Side: Right upper extremity  Side: Left upper extremity  Skin & Extremity Inspection: Bouchard's nodes (PIP)  Skin & Extremity Inspection: Skin color, temperature, and hair growth are WNL. No peripheral edema or cyanosis. No masses, redness, swelling, asymmetry, or associated skin lesions. No contractures.  Functional ROM: Pain restricted ROM          Functional ROM: Unrestricted ROM          Muscle Tone/Strength: Functionally intact. No obvious neuro-muscular anomalies detected.  Muscle Tone/Strength: Functionally intact. No obvious neuro-muscular anomalies detected.  Sensory (Neurological): Unimpaired          Sensory (Neurological): Unimpaired          Palpation: No palpable anomalies              Palpation: No palpable anomalies               Provocative Test(s):  Phalen's test: deferred Tinel's test: deferred Apley's scratch test (touch opposite shoulder):  Action 1 (Across chest): deferred Action 2 (Overhead): deferred Action 3 (LB reach): deferred   Provocative Test(s):  Phalen's test: deferred Tinel's test: deferred Apley's scratch test (touch opposite shoulder):  Action 1 (Across chest): deferred Action 2 (Overhead): deferred Action 3 (LB reach): deferred    Thoracic Spine Area Exam  Skin & Axial Inspection: No masses, redness, or swelling Alignment: Symmetrical Functional ROM: Unrestricted ROM Stability: No instability detected Muscle Tone/Strength: Functionally intact. No obvious neuro-muscular anomalies detected. Sensory (Neurological): Unimpaired Muscle strength & Tone: No palpable anomalies  Lumbar Spine Area Exam  Skin & Axial Inspection: No masses, redness, or swelling Alignment: Symmetrical Functional ROM: Unrestricted ROM       Stability: No instability detected Muscle Tone/Strength: Functionally intact. No obvious neuro-muscular anomalies detected. Sensory (Neurological): Unimpaired Palpation: No palpable anomalies  Provocative Tests: Lumbar Hyperextension/rotation test: deferred today       Lumbar quadrant test (Kemp's test): deferred today       Lumbar Lateral bending test: deferred today       Patrick's Maneuver: deferred today                   FABER test: deferred today       Thigh-thrust test: deferred today       S-I compression test: deferred today       S-I distraction test: deferred today        Gait & Posture Assessment  Ambulation: Unassisted Gait: Relatively normal for age and body habitus Posture: WNL   Lower Extremity Exam    Side: Right lower extremity  Side: Left lower extremity  Stability: No instability observed          Stability: No instability observed          Skin & Extremity Inspection: Evidence of prior arthroplastic surgery  Skin & Extremity  Inspection: Skin color, temperature, and hair growth are WNL. No peripheral edema or cyanosis. No masses, redness, swelling, asymmetry, or associated skin lesions. No contractures.  Functional ROM: Pain restricted ROM                  Functional ROM: Unrestricted ROM                  Muscle Tone/Strength: Functionally intact. No obvious neuro-muscular anomalies detected.  Muscle Tone/Strength: Functionally intact. No obvious neuro-muscular anomalies detected.  Sensory (Neurological): Unimpaired  Sensory (Neurological): Unimpaired  Palpation: No palpable anomalies  Palpation: No palpable anomalies   Assessment  Primary Diagnosis & Pertinent Problem List: The primary encounter diagnosis was Osteoarthritis of multiple joints, unspecified osteoarthritis type. Diagnoses of Chronic pain of right knee, Chronic midline low back pain without sciatica, and Chronic pain syndrome were also pertinent to this visit.  Status Diagnosis  Worsening Persistent Controlled 1. Osteoarthritis of multiple joints, unspecified osteoarthritis type   2. Chronic pain of right knee   3. Chronic midline low back pain without sciatica   4. Chronic pain syndrome     Problems updated and reviewed during this visit: Problem  Hld (Hyperlipidemia)   Plan of Care  Pharmacotherapy (Medications Ordered): Meds ordered this encounter  Medications  . oxyCODONE-acetaminophen (PERCOCET) 7.5-325 MG tablet    Sig: Take 1 tablet by mouth every 8 (eight) hours as needed for moderate pain or severe pain.    Dispense:  90 tablet    Refill:  0    Do not place this medication, or any other prescription from our practice, on "Automatic Refill". Patient may have prescription filled one day early if pharmacy is closed on scheduled refill date. Do not fill until: :09/04/2017 To last until: 10/04/2017    Order Specific Question:   Supervising Provider    Answer:   Milinda Pointer [phone]  . oxyCODONE-acetaminophen (PERCOCET) 7.5-325  MG tablet    Sig: Take 1 tablet by mouth every 8 (eight) hours as needed for moderate pain or severe pain.    Dispense:  90 tablet    Refill:  0    Do not place this medication, or any other prescription from our practice, on "Automatic Refill". Patient may have prescription filled one day early if pharmacy is closed on scheduled refill date. Do not fill until: 08/05/2017 To last until:09/04/2017    Order Specific Question:   Supervising Provider    Answer:  [place], [place] [[id number]]   New Prescriptions   No medications on file   Medications administered today: Taylor [name] had no medications administered during this visit. Lab-work, procedure(s), and/or referral(s): No orders of the defined types were placed in this encounter.  Imaging and/or referral(s): None  Interventional therapies: Planned, scheduled, and/or pending:   Not at this time. Declines labs to evaluate CRP, sed rate and Mg levels. Encouraged pt to watch her diet for inflammatory foods.     Provider-requested follow-up: Return in about 2 months (around 09/24/2017) for MedMgmt with Me Donella Stade [facility]).  Future Appointments  Date Time Provider [place]  08/05/2017 11:20 AM [name], MD [place] [facility]  09/23/2017 12:45 PM Vevelyn Francois, NP [facility] None  06/05/2018  1:45 PM Hayden Pedro, MD [facility] None   Primary Care Physician: [name], MD Location: [facility] Note by: Vevelyn Francois NP Date: 07/25/2017; Time: 2:51 PM  Pain Score Disclaimer: We use the NRS-11 scale. This is a self-reported, subjective measurement of pain severity with only modest accuracy. It is used primarily to identify changes within a particular patient. It must be understood that outpatient pain scales are significantly less accurate that those used for research, where they can be applied under ideal controlled circumstances with minimal exposure to variables. In reality, the score is  likely to be a combination of pain intensity and pain affect, where pain affect describes the degree of emotional arousal or changes in action readiness caused by the sensory experience of pain. Factors such as social and work situation, setting, emotional state, anxiety levels, expectation, and prior pain experience may influence pain perception and show large inter-individual differences that may also be affected by time variables.  Patient instructions provided during this appointment: Patient Instructions   ____________________________________________________________________________________________  Medication Rules  Applies to: All patients receiving prescriptions (written or electronic).  Pharmacy of record: Pharmacy where electronic prescriptions will be sent. If written prescriptions are taken to a different pharmacy, please inform the nursing staff. The pharmacy listed in the electronic medical record should be the one where you would like electronic prescriptions to be sent.  Prescription refills: Only during scheduled appointments. Applies to both, written and electronic prescriptions.  NOTE: The following applies primarily to controlled substances (Opioid* Pain Medications).   Patient's responsibilities: 1. Pain Pills: Bring all pain pills to every appointment (except for procedure appointments). 2. Pill Bottles: Bring pills in original pharmacy bottle. Always bring newest bottle. Bring bottle, even if empty. 3. Medication refills: You are responsible for knowing and keeping track of what medications you need refilled. The day before your appointment, write a list of all prescriptions that need to be refilled. Bring that list to your appointment and give it to the admitting nurse. Prescriptions will be written only during appointments. If you forget a medication, it will not be "Called in", "Faxed", or "electronically sent". You will need to get another appointment to get these  prescribed. 4. Prescription Accuracy: You are responsible for carefully inspecting your prescriptions before leaving our office. Have the discharge nurse carefully go over each prescription with you, before taking them home. Make sure that your name is accurately spelled, that your address is correct. Check the name and dose of your medication to make sure it is accurate. Check the number of pills, and the written instructions to make sure they are clear and accurate. Make sure that you are given enough medication to last until your next medication  refill appointment. 5. Taking Medication: Take medication as prescribed. Never take more pills than instructed. Never take medication more frequently than prescribed. Taking less pills or less frequently is permitted and encouraged, when it comes to controlled substances (written prescriptions).  6. Inform other Doctors: Always inform, all of your healthcare providers, of all the medications you take. 7. Pain Medication from other Providers: You are not allowed to accept any additional pain medication from any other Doctor or Healthcare provider. There are two exceptions to this rule. (see below) In the event that you require additional pain medication, you are responsible for notifying us, as stated below. 8. Medication Agreement: You are responsible for carefully reading and following our Medication Agreement. This must be signed before receiving any prescriptions from our practice. Safely store a copy of your signed Agreement. Violations to the Agreement will result in no further prescriptions. (Additional copies of our Medication Agreement are available upon request.) 9. Laws, Rules, & Regulations: All patients are expected to follow all Federal and [employer], [employer], Rules, [employer]. Ignorance of the Laws does not constitute a valid excuse. The use of any illegal substances is prohibited. 10. Adopted CDC guidelines & recommendations: Target dosing  levels will be at or below 60 MME/day. Use of benzodiazepines** is not recommended.  Exceptions: There are only two exceptions to the rule of not receiving pain medications from other Healthcare Providers. 1. Exception #1 (Emergencies): In the event of an emergency (i.e.: accident requiring emergency care), you are allowed to receive additional pain medication. However, you are responsible for: As soon as you are able, call our office [phone], at any time of the day or night, and leave a message stating your name, the date and nature of the emergency, and the name and dose of the medication prescribed. In the event that your call is answered by a member of our staff, make sure to document and save the date, time, and the name of the person that took your information.  2. Exception #2 (Planned Surgery): In the event that you are scheduled by another doctor or dentist to have any type of surgery or procedure, you are allowed (for a period no longer than 30 days), to receive additional pain medication, for the acute post-op pain. However, in this case, you are responsible for picking up a copy of our "Post-op Pain Management for Surgeons" handout, and giving it to your surgeon or dentist. This document is available at our office, and does not require an appointment to obtain it. Simply go to our office during business hours (Monday-Thursday from 8:00 AM to 4:00 PM) (Friday 8:00 AM to 12:00 Noon) or if you have a scheduled appointment with [place], prior to your surgery, and ask for it by name. In addition, you will need to provide [place] with your name, name of your surgeon, type of surgery, and date of procedure or surgery.  *Opioid medications include: morphine, codeine, oxycodone, oxymorphone, hydrocodone, hydromorphone, meperidine, tramadol, tapentadol, buprenorphine, fentanyl, methadone. **Benzodiazepine medications include: diazepam (Valium), alprazolam (Xanax), clonazepam (Klonopine), lorazepam (Ativan),  clorazepate (Tranxene), chlordiazepoxide (Librium), estazolam (Prosom), oxazepam (Serax), temazepam (Restoril), triazolam (Halcion) (Last updated: 04/18/2017) ____________________________________________________________________________________________   ____________________________________________________________________________________________  Pain Scale  Introduction: The pain score used by this practice is the Verbal Numerical Rating Scale (VNRS-11). This is an 11-point scale. It is for adults and children 10 years or older. There are significant differences in how the pain score is reported, used, and applied. Forget everything you learned in the  past and learn this scoring system.  General Information: The scale should reflect your current level of pain. Unless you are specifically asked for the level of your worst pain, or your average pain. If you are asked for one of these two, then it should be understood that it is over the past 24 hours.  Basic Activities of Daily Living (ADL): Personal hygiene, dressing, eating, transferring, and using restroom.  Instructions: Most patients tend to report their level of pain as a combination of two factors, their physical pain and their psychosocial pain. This last one is also known as "suffering" and it is reflection of how physical pain affects you socially and psychologically. From now on, report them separately. From this point on, when asked to report your pain level, report only your physical pain. Use the following table for reference.  Pain Clinic Pain Levels (0-5/10)  Pain Level Score  Description  No Pain 0   Mild pain 1 Nagging, annoying, but does not interfere with basic activities of daily living (ADL). Patients are able to eat, bathe, get dressed, toileting (being able to get on and off the toilet and perform personal hygiene functions), transfer (move in and out of bed or a chair without assistance), and maintain continence (able to  control bladder and bowel functions). Blood pressure and heart rate are unaffected. A normal heart rate for a healthy adult ranges from 60 to 100 bpm (beats per minute).   Mild to moderate pain 2 Noticeable and distracting. Impossible to hide from other people. More frequent flare-ups. Still possible to adapt and function close to normal. It can be very annoying and may have occasional stronger flare-ups. With discipline, patients may get used to it and adapt.   Moderate pain 3 Interferes significantly with activities of daily living (ADL). It becomes difficult to feed, bathe, get dressed, get on and off the toilet or to perform personal hygiene functions. Difficult to get in and out of bed or a chair without assistance. Very distracting. With effort, it can be ignored when deeply involved in activities.   Moderately severe pain 4 Impossible to ignore for more than a few minutes. With effort, patients may still be able to manage work or participate in some social activities. Very difficult to concentrate. Signs of autonomic nervous system discharge are evident: dilated pupils (mydriasis); mild sweating (diaphoresis); sleep interference. Heart rate becomes elevated (>115 bpm). Diastolic blood pressure (lower number) rises above 100 mmHg. Patients find relief in laying down and not moving.   Severe pain 5 Intense and extremely unpleasant. Associated with frowning face and frequent crying. Pain overwhelms the senses.  Ability to do any activity or maintain social relationships becomes significantly limited. Conversation becomes difficult. Pacing back and forth is common, as getting into a comfortable position is nearly impossible. Pain wakes you up from deep sleep. Physical signs will be obvious: pupillary dilation; increased sweating; goosebumps; brisk reflexes; cold, clammy hands and feet; nausea, vomiting or dry heaves; loss of appetite; significant sleep disturbance with inability to fall asleep or to  remain asleep. When persistent, significant weight loss is observed due to the complete loss of appetite and sleep deprivation.  Blood pressure and heart rate becomes significantly elevated. Caution: If elevated blood pressure triggers a pounding headache associated with blurred vision, then the patient should immediately seek attention at an urgent or emergency care unit, as these may be signs of an impending stroke.    Emergency Department Pain Levels (6-10/10)  Emergency Room  Pain 6 Severely limiting. Requires emergency care and should not be seen or managed at an outpatient pain management facility. Communication becomes difficult and requires great effort. Assistance to reach the emergency department may be required. Facial flushing and profuse sweating along with potentially dangerous increases in heart rate and blood pressure will be evident.   Distressing pain 7 Self-care is very difficult. Assistance is required to transport, or use restroom. Assistance to reach the emergency department will be required. Tasks requiring coordination, such as bathing and getting dressed become very difficult.   Disabling pain 8 Self-care is no longer possible. At this level, pain is disabling. The individual is unable to do even the most "basic" activities such as walking, eating, bathing, dressing, transferring to a bed, or toileting. Fine motor skills are lost. It is difficult to think clearly.   Incapacitating pain 9 Pain becomes incapacitating. Thought processing is no longer possible. Difficult to remember your own name. Control of movement and coordination are lost.   The worst pain imaginable 10 At this level, most patients pass out from pain. When this level is reached, collapse of the autonomic nervous system occurs, leading to a sudden drop in blood pressure and heart rate. This in turn results in a temporary and dramatic drop in blood flow to the brain, leading to a loss of consciousness. Fainting is  one of the body's self defense mechanisms. Passing out puts the brain in a calmed state and causes it to shut down for a while, in order to begin the healing process.    Summary: 1. Refer to this scale when providing [place] with your pain level. 2. Be accurate and careful when reporting your pain level. This will help with your care. 3. Over-reporting your pain level will lead to loss of credibility. 4. Even a level of 1/10 means that there is pain and will be treated at our facility. 5. High, inaccurate reporting will be documented as "Symptom Exaggeration", leading to loss of credibility and suspicions of possible secondary gains such as obtaining more narcotics, or wanting to appear disabled, for fraudulent reasons. 6. Only pain levels of 5 or below will be seen at our facility. 7. Pain levels of 6 and above will be sent to the Emergency Department and the appointment cancelled. ____________________________________________________________________________________________  BMI Assessment: Estimated body mass index is 40.25 kg/m as calculated from the following:   Height as of this encounter: 5' 1"  (1.549 m).   Weight as of this encounter: 213 lb (96.6 kg).  BMI interpretation table: BMI level Category Range association with higher incidence of chronic pain  <18 kg/m2 Underweight   18.5-24.9 kg/m2 Ideal body weight   25-29.9 kg/m2 Overweight Increased incidence by 20%  30-34.9 kg/m2 Obese (Class I) Increased incidence by 68%  35-39.9 kg/m2 Severe obesity (Class II) Increased incidence by 136%  >40 kg/m2 Extreme obesity (Class III) Increased incidence by 254%   Patient's current BMI Ideal Body weight  Body mass index is 40.25 kg/m. Ideal body weight: 47.8 kg (105 lb 6.1 oz) Adjusted ideal body weight: 67.3 kg (148 lb 6.8 oz)   BMI Readings from Last 4 Encounters:  07/25/17 40.25 kg/m  05/27/17 40.25 kg/m  04/03/17 40.40 kg/m  03/21/17 40.15 kg/m   Wt Readings from Last 4  Encounters:  07/25/17 213 lb (96.6 kg)  05/27/17 213 lb (96.6 kg)  04/03/17 213 lb 12.8 oz (97 kg)  03/21/17 212 lb 8 oz (96.4 kg)   You were given 2 prescriptions  for Percocet today.

## 2017-07-25 NOTE — Patient Instructions (Addendum)
____________________________________________________________________________________________  Medication Rules  Applies to: All patients receiving prescriptions (written or electronic).  Pharmacy of record: Pharmacy where electronic prescriptions will be sent. If written prescriptions are taken to a different pharmacy, please inform the nursing staff. The pharmacy listed in the electronic medical record should be the one where you would like electronic prescriptions to be sent.  Prescription refills: Only during scheduled appointments. Applies to both, written and electronic prescriptions.  NOTE: The following applies primarily to controlled substances (Opioid* Pain Medications).   Patient's responsibilities: 1. Pain Pills: Bring all pain pills to every appointment (except for procedure appointments). 2. Pill Bottles: Bring pills in original pharmacy bottle. Always bring newest bottle. Bring bottle, even if empty. 3. Medication refills: You are responsible for knowing and keeping track of what medications you need refilled. The day before your appointment, write a list of all prescriptions that need to be refilled. Bring that list to your appointment and give it to the admitting nurse. Prescriptions will be written only during appointments. If you forget a medication, it will not be "Called in", "Faxed", or "electronically sent". You will need to get another appointment to get these prescribed. 4. Prescription Accuracy: You are responsible for carefully inspecting your prescriptions before leaving our office. Have the discharge nurse carefully go over each prescription with you, before taking them home. Make sure that your name is accurately spelled, that your address is correct. Check the name and dose of your medication to make sure it is accurate. Check the number of pills, and the written instructions to make sure they are clear and accurate. Make sure that you are given enough medication to last  until your next medication refill appointment. 5. Taking Medication: Take medication as prescribed. Never take more pills than instructed. Never take medication more frequently than prescribed. Taking less pills or less frequently is permitted and encouraged, when it comes to controlled substances (written prescriptions).  6. Inform other Doctors: Always inform, all of your healthcare providers, of all the medications you take. 7. Pain Medication from other Providers: You are not allowed to accept any additional pain medication from any other Doctor or Healthcare provider. There are two exceptions to this rule. (see below) In the event that you require additional pain medication, you are responsible for notifying us, as stated below. 8. Medication Agreement: You are responsible for carefully reading and following our Medication Agreement. This must be signed before receiving any prescriptions from our practice. Safely store a copy of your signed Agreement. Violations to the Agreement will result in no further prescriptions. (Additional copies of our Medication Agreement are available upon request.) 9. Laws, Rules, & Regulations: All patients are expected to follow all Federal and State Laws, Statutes, Rules, & Regulations. Ignorance of the Laws does not constitute a valid excuse. The use of any illegal substances is prohibited. 10. Adopted CDC guidelines & recommendations: Target dosing levels will be at or below 60 MME/day. Use of benzodiazepines** is not recommended.  Exceptions: There are only two exceptions to the rule of not receiving pain medications from other Healthcare Providers. 1. Exception #1 (Emergencies): In the event of an emergency (i.e.: accident requiring emergency care), you are allowed to receive additional pain medication. However, you are responsible for: As soon as you are able, call our office [phone], at any time of the day or night, and leave a message stating your name, the  date and nature of the emergency, and the name and dose of the medication   prescribed. In the event that your call is answered by a member of our staff, make sure to document and save the date, time, and the name of the person that took your information.  2. Exception #2 (Planned Surgery): In the event that you are scheduled by another doctor or dentist to have any type of surgery or procedure, you are allowed (for a period no longer than 30 days), to receive additional pain medication, for the acute post-op pain. However, in this case, you are responsible for picking up a copy of our "Post-op Pain Management for Surgeons" handout, and giving it to your surgeon or dentist. This document is available at our office, and does not require an appointment to obtain it. Simply go to our office during business hours (Monday-Thursday from 8:00 AM to 4:00 PM) (Friday 8:00 AM to 12:00 Noon) or if you have a scheduled appointment with us, prior to your surgery, and ask for it by name. In addition, you will need to provide us with your name, name of your surgeon, type of surgery, and date of procedure or surgery.  *Opioid medications include: morphine, codeine, oxycodone, oxymorphone, hydrocodone, hydromorphone, meperidine, tramadol, tapentadol, buprenorphine, fentanyl, methadone. **Benzodiazepine medications include: diazepam (Valium), alprazolam (Xanax), clonazepam (Klonopine), lorazepam (Ativan), clorazepate (Tranxene), chlordiazepoxide (Librium), estazolam (Prosom), oxazepam (Serax), temazepam (Restoril), triazolam (Halcion) (Last updated: 04/18/2017) ____________________________________________________________________________________________   ____________________________________________________________________________________________  Pain Scale  Introduction: The pain score used by this practice is the Verbal Numerical Rating Scale (VNRS-11). This is an 11-point scale. It is for adults and children 10 years or  older. There are significant differences in how the pain score is reported, used, and applied. Forget everything you learned in the past and learn this scoring system.  General Information: The scale should reflect your current level of pain. Unless you are specifically asked for the level of your worst pain, or your average pain. If you are asked for one of these two, then it should be understood that it is over the past 24 hours.  Basic Activities of Daily Living (ADL): Personal hygiene, dressing, eating, transferring, and using restroom.  Instructions: Most patients tend to report their level of pain as a combination of two factors, their physical pain and their psychosocial pain. This last one is also known as "suffering" and it is reflection of how physical pain affects you socially and psychologically. From now on, report them separately. From this point on, when asked to report your pain level, report only your physical pain. Use the following table for reference.  Pain Clinic Pain Levels (0-5/10)  Pain Level Score  Description  No Pain 0   Mild pain 1 Nagging, annoying, but does not interfere with basic activities of daily living (ADL). Patients are able to eat, bathe, get dressed, toileting (being able to get on and off the toilet and perform personal hygiene functions), transfer (move in and out of bed or a chair without assistance), and maintain continence (able to control bladder and bowel functions). Blood pressure and heart rate are unaffected. A normal heart rate for a healthy adult ranges from 60 to 100 bpm (beats per minute).   Mild to moderate pain 2 Noticeable and distracting. Impossible to hide from other people. More frequent flare-ups. Still possible to adapt and function close to normal. It can be very annoying and may have occasional stronger flare-ups. With discipline, patients may get used to it and adapt.   Moderate pain 3 Interferes significantly with activities of daily  living (ADL).   It becomes difficult to feed, bathe, get dressed, get on and off the toilet or to perform personal hygiene functions. Difficult to get in and out of bed or a chair without assistance. Very distracting. With effort, it can be ignored when deeply involved in activities.   Moderately severe pain 4 Impossible to ignore for more than a few minutes. With effort, patients may still be able to manage work or participate in some social activities. Very difficult to concentrate. Signs of autonomic nervous system discharge are evident: dilated pupils (mydriasis); mild sweating (diaphoresis); sleep interference. Heart rate becomes elevated (>115 bpm). Diastolic blood pressure (lower number) rises above 100 mmHg. Patients find relief in laying down and not moving.   Severe pain 5 Intense and extremely unpleasant. Associated with frowning face and frequent crying. Pain overwhelms the senses.  Ability to do any activity or maintain social relationships becomes significantly limited. Conversation becomes difficult. Pacing back and forth is common, as getting into a comfortable position is nearly impossible. Pain wakes you up from deep sleep. Physical signs will be obvious: pupillary dilation; increased sweating; goosebumps; brisk reflexes; cold, clammy hands and feet; nausea, vomiting or dry heaves; loss of appetite; significant sleep disturbance with inability to fall asleep or to remain asleep. When persistent, significant weight loss is observed due to the complete loss of appetite and sleep deprivation.  Blood pressure and heart rate becomes significantly elevated. Caution: If elevated blood pressure triggers a pounding headache associated with blurred vision, then the patient should immediately seek attention at an urgent or emergency care unit, as these may be signs of an impending stroke.    Emergency Department Pain Levels (6-10/10)  Emergency Room Pain 6 Severely limiting. Requires emergency care  and should not be seen or managed at an outpatient pain management facility. Communication becomes difficult and requires great effort. Assistance to reach the emergency department may be required. Facial flushing and profuse sweating along with potentially dangerous increases in heart rate and blood pressure will be evident.   Distressing pain 7 Self-care is very difficult. Assistance is required to transport, or use restroom. Assistance to reach the emergency department will be required. Tasks requiring coordination, such as bathing and getting dressed become very difficult.   Disabling pain 8 Self-care is no longer possible. At this level, pain is disabling. The individual is unable to do even the most "basic" activities such as walking, eating, bathing, dressing, transferring to a bed, or toileting. Fine motor skills are lost. It is difficult to think clearly.   Incapacitating pain 9 Pain becomes incapacitating. Thought processing is no longer possible. Difficult to remember your own name. Control of movement and coordination are lost.   The worst pain imaginable 10 At this level, most patients pass out from pain. When this level is reached, collapse of the autonomic nervous system occurs, leading to a sudden drop in blood pressure and heart rate. This in turn results in a temporary and dramatic drop in blood flow to the brain, leading to a loss of consciousness. Fainting is one of the body's self defense mechanisms. Passing out puts the brain in a calmed state and causes it to shut down for a while, in order to begin the healing process.    Summary: 1. Refer to this scale when providing us with your pain level. 2. Be accurate and careful when reporting your pain level. This will help with your care. 3. Over-reporting your pain level will lead to loss of credibility. 4. Even   a level of 1/10 means that there is pain and will be treated at our facility. 5. High, inaccurate reporting will be  documented as "Symptom Exaggeration", leading to loss of credibility and suspicions of possible secondary gains such as obtaining more narcotics, or wanting to appear disabled, for fraudulent reasons. 6. Only pain levels of 5 or below will be seen at our facility. 7. Pain levels of 6 and above will be sent to the Emergency Department and the appointment cancelled. ____________________________________________________________________________________________  BMI Assessment: Estimated body mass index is 40.25 kg/m as calculated from the following:   Height as of this encounter: 5\' 1"  (1.549 m).   Weight as of this encounter: 213 lb (96.6 kg).  BMI interpretation table: BMI level Category Range association with higher incidence of chronic pain  <18 kg/m2 Underweight   18.5-24.9 kg/m2 Ideal body weight   25-29.9 kg/m2 Overweight Increased incidence by 20%  30-34.9 kg/m2 Obese (Class I) Increased incidence by 68%  35-39.9 kg/m2 Severe obesity (Class II) Increased incidence by 136%  >40 kg/m2 Extreme obesity (Class III) Increased incidence by 254%   Patient's current BMI Ideal Body weight  Body mass index is 40.25 kg/m. Ideal body weight: 47.8 kg (105 lb 6.1 oz) Adjusted ideal body weight: 67.3 kg (148 lb 6.8 oz)   BMI Readings from Last 4 Encounters:  07/25/17 40.25 kg/m  05/27/17 40.25 kg/m  04/03/17 40.40 kg/m  03/21/17 40.15 kg/m   Wt Readings from Last 4 Encounters:  07/25/17 213 lb (96.6 kg)  05/27/17 213 lb (96.6 kg)  04/03/17 213 lb 12.8 oz (97 kg)  03/21/17 212 lb 8 oz (96.4 kg)   You were given 2 prescriptions for Percocet today.

## 2017-07-25 NOTE — Progress Notes (Signed)
Nursing Pain Medication Assessment:  Safety precautions to be maintained throughout the outpatient stay will include: orient to surroundings, keep bed in low position, maintain call bell within reach at all times, provide assistance with transfer out of bed and ambulation.  Medication Inspection Compliance: Pill count conducted under aseptic conditions, in front of the patient. Neither the pills nor the bottle was removed from the patient's sight at any time. Once count was completed pills were immediately returned to the patient in their original bottle.  Medication: Oxycodone/APAP Pill/Patch Count: 41  of 90 pills remain Pill/Patch Appearance: Markings consistent with prescribed medication Bottle Appearance: Standard pharmacy container. Clearly labeled. Filled Date:05/18/ 2019 Last Medication intake:  Today

## 2017-08-01 ENCOUNTER — Other Ambulatory Visit: Payer: Self-pay

## 2017-08-01 ENCOUNTER — Other Ambulatory Visit: Payer: Self-pay | Admitting: Family Medicine

## 2017-08-01 DIAGNOSIS — Z1231 Encounter for screening mammogram for malignant neoplasm of breast: Secondary | ICD-10-CM

## 2017-08-01 DIAGNOSIS — K219 Gastro-esophageal reflux disease without esophagitis: Secondary | ICD-10-CM

## 2017-08-01 NOTE — Telephone Encounter (Signed)
Refill request for general medication. Ranitidine to [facility]  Last office visit: 04/03/17   Follow up on 08/05/17

## 2017-08-02 MED ORDER — RANITIDINE HCL 150 MG PO TABS: ORAL_TABLET | ORAL | 0 refills | Status: DC

## 2017-08-05 ENCOUNTER — Ambulatory Visit (INDEPENDENT_AMBULATORY_CARE_PROVIDER_SITE_OTHER): Payer: Medicare Other | Admitting: Family Medicine

## 2017-08-05 ENCOUNTER — Encounter: Payer: Self-pay | Admitting: Family Medicine

## 2017-08-05 VITALS — BP 148/72 | HR 81 | Temp 98.3°F | Resp 16 | Ht 61.0 in | Wt 212.6 lb

## 2017-08-05 DIAGNOSIS — I700 Atherosclerosis of aorta: Secondary | ICD-10-CM | POA: Diagnosis not present

## 2017-08-05 DIAGNOSIS — R809 Proteinuria, unspecified: Secondary | ICD-10-CM | POA: Diagnosis not present

## 2017-08-05 DIAGNOSIS — E1129 Type 2 diabetes mellitus with other diabetic kidney complication: Secondary | ICD-10-CM | POA: Diagnosis not present

## 2017-08-05 DIAGNOSIS — K219 Gastro-esophageal reflux disease without esophagitis: Secondary | ICD-10-CM

## 2017-08-05 DIAGNOSIS — M150 Primary generalized (osteo)arthritis: Secondary | ICD-10-CM | POA: Diagnosis not present

## 2017-08-05 DIAGNOSIS — D649 Anemia, unspecified: Secondary | ICD-10-CM

## 2017-08-05 DIAGNOSIS — Z748 Other problems related to care provider dependency: Secondary | ICD-10-CM | POA: Diagnosis not present

## 2017-08-05 DIAGNOSIS — E7800 Pure hypercholesterolemia, unspecified: Secondary | ICD-10-CM | POA: Diagnosis not present

## 2017-08-05 DIAGNOSIS — Z1231 Encounter for screening mammogram for malignant neoplasm of breast: Secondary | ICD-10-CM

## 2017-08-05 DIAGNOSIS — I10 Essential (primary) hypertension: Secondary | ICD-10-CM

## 2017-08-05 DIAGNOSIS — M810 Age-related osteoporosis without current pathological fracture: Secondary | ICD-10-CM | POA: Diagnosis not present

## 2017-08-05 DIAGNOSIS — R252 Cramp and spasm: Secondary | ICD-10-CM

## 2017-08-05 DIAGNOSIS — E1169 Type 2 diabetes mellitus with other specified complication: Secondary | ICD-10-CM

## 2017-08-05 DIAGNOSIS — M8949 Other hypertrophic osteoarthropathy, multiple sites: Secondary | ICD-10-CM

## 2017-08-05 DIAGNOSIS — E559 Vitamin D deficiency, unspecified: Secondary | ICD-10-CM

## 2017-08-05 DIAGNOSIS — E785 Hyperlipidemia, unspecified: Secondary | ICD-10-CM

## 2017-08-05 DIAGNOSIS — M159 Polyosteoarthritis, unspecified: Secondary | ICD-10-CM

## 2017-08-05 LAB — POCT GLYCOSYLATED HEMOGLOBIN (HGB A1C): HbA1c, POC (prediabetic range): 7.8 % — AB (ref 5.7–6.4)

## 2017-08-05 LAB — POCT UA - MICROALBUMIN: Microalbumin Ur, POC: 50 mg/L

## 2017-08-05 MED ORDER — OLMESARTAN MEDOXOMIL 20 MG PO TABS: 20.0000 mg | ORAL_TABLET | Freq: Every day | ORAL | 1 refills | Status: DC

## 2017-08-05 MED ORDER — PRAVASTATIN SODIUM 40 MG PO TABS: 40.0000 mg | ORAL_TABLET | Freq: Every day | ORAL | 1 refills | Status: DC

## 2017-08-05 MED ORDER — RANITIDINE HCL 150 MG PO TABS: ORAL_TABLET | ORAL | 0 refills | Status: DC

## 2017-08-05 MED ORDER — AMLODIPINE BESYLATE 10 MG PO TABS: 10.0000 mg | ORAL_TABLET | Freq: Every day | ORAL | 0 refills | Status: DC

## 2017-08-05 MED ORDER — VITAMIN D (ERGOCALCIFEROL) 1.25 MG (50000 UNIT) PO CAPS: 50000.0000 [IU] | ORAL_CAPSULE | ORAL | 0 refills | Status: DC

## 2017-08-05 MED ORDER — ALENDRONATE SODIUM 70 MG PO TABS: ORAL_TABLET | ORAL | 1 refills | Status: DC

## 2017-08-05 MED ORDER — METFORMIN HCL ER 750 MG PO TB24: 1500.0000 mg | ORAL_TABLET | Freq: Every day | ORAL | 1 refills | Status: DC

## 2017-08-05 NOTE — Progress Notes (Signed)
Name: Taylor [name]   MRN: [id number]    DOB: Feb 21, 1932   Date:08/05/2017       Progress Note  Subjective  Chief Complaint  Chief Complaint  Patient presents with  . Medication Refill  . Diabetes    she states glucose goes up and down  . Hypertension    Edema in ankles- has been a chronic problem  . Hyperlipidemia  . Gastroesophageal Reflux  . Osteoporosis    HPI   HTN: she was seen with a chronic cough January 2018, CXR negative for lung problems, ace stopped and is on Benicar, bp is at goal for her, cough resolved since. No chest pain or palpitation  Hyperlipidemia: she is taking pravastatin and Omega 3 fatty acids, no myalgia. She has cramps on her hands, discussed NCS   DM: she states glucose goes up and down, but usually 150 in the mornings, She denies polyphagia, polydipsia or polyuria. Eye exam is up to date foot exam done by Dr. Garry Heater has microalbuminuria and is taking ARB. She also has dyslipidemia with low HDL, but LDL is at goal on statin therapy, currently also on Lovaza. HgbA1C is stable at 7.8%   GERD: sheis offOmeprazole,doing wlel on Ranitidine prn.   FY:BOFBP/ right knee /chronic back pain:explained the risk of narcotics,she goes to pain clinic, Dr. Andree Elk. Discussed referral to Ortho but she states she does not want to have another surgery. She has difficulty walking since fall 10/2016 - went to rehab and is now having home PT still using a walker but unable to pick walker to place in trunk of car and needs help leaving her house  Osteoporosis: had recent CXR that showed compression fractures, she states that she has noticed pain on her bra line, getting better, she is on Fosamax but explained she needs labs and may need to go on Forteo or Prolia , she agrees on seeing endocrinologist, especially because of CKI and cannot continue fosamax long term. Back pain is much better now. She has follow up scheduled with Dr. Honor Junes     Patient Active  Problem List   Diagnosis Date Noted  . HLD (hyperlipidemia) 07/25/2017  . Osteoporotic compression fracture of spine ([place]) 03/22/2017  . Assistance needed with transportation 03/21/2017  . Iron deficiency anemia 12/19/2016  . Aortic atherosclerosis ([place]) 11/23/2016  . Anterolisthesis 11/23/2016  . Chronic pain syndrome 09/13/2016  . Vitamin D deficiency 08/19/2016  . Anemia, unspecified 08/19/2016  . Primary open angle glaucoma (POAG) of both eyes, severe stage 08/17/2016  . Hypertensive retinopathy 08/17/2016  . Long term current use of opiate analgesic 07/19/2016  . Dyslipidemia associated with type 2 diabetes mellitus ([place]) 04/16/2016  . Chronic midline low back pain without sciatica 04/16/2016  . Chronic pain of right knee 04/16/2016  . Vaginal dryness 02/22/2015  . Calculus of kidney 11/10/2014  . Osteoarthritis, multiple sites 09/09/2014  . Type 2 diabetes mellitus with microalbuminuria ([place]) 09/09/2014  . Glaucoma 09/09/2014  . BP (high blood pressure) 09/09/2014  . Chronic pain 09/09/2014  . Tinea corporis 09/09/2014  . Umbilical hernia 12/13/8525  . Lump or mass in breast     Past Surgical History:  Procedure Laterality Date  . ABDOMINAL HYSTERECTOMY  1958   menorrhagia  . BACK SURGERY    . CATARACT EXTRACTION, BILATERAL Bilateral    1st 06/07/15 and the 2nd 06/21/15  . CHOLECYSTECTOMY    . COLONOSCOPY  2003   [facility]  . CORONARY ARTERY BYPASS GRAFT Left 01/03/2012,01/24/2012  Left FNA, Left breast Encore bx  . KNEE SURGERY  2009,2011,2012   twice on right and once on left  . KNEE SURGERY    . LIPOMA EXCISION  1998  . [place]  2004  . TONSILLECTOMY     age of 9    Family History  Problem Relation Age of Onset  . Cancer Mother        skin cancer  . Stroke Mother   . Heart disease Father   . Breast cancer Neg Hx     Social History   Socioeconomic History  . Marital status: Divorced    Spouse name: Not on file  . Number of children: 4  . Years  of education: Not on file  . Highest education level: Not on file  Occupational History  . 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 Financial resource strain: Not on file  . Food insecurity:    Worry: Not on file    Inability: Not on file  . Transportation needs:    Medical: Not on file    Non-medical: Not on file  Tobacco Use  . Smoking status: Never Smoker  . Smokeless tobacco: Never Used  Substance and Sexual Activity  . Alcohol use: No    Alcohol/week: 0.0 oz  . Drug use: No  . Sexual activity: Never  Lifestyle  . Physical activity:    Days per week: Not on file    Minutes per session: Not on file  . Stress: Not on file  Relationships  . Social connections:    Talks on phone: Not on file    Gets together: Not on file    Attends religious service: Not on file    Active member of club or organization: Not on file    Attends meetings of clubs or organizations: Not on file    Relationship status: Not on file  . Intimate partner violence:    Fear of current or ex partner: Not on file    Emotionally abused: Not on file    Physically abused: Not on file    Forced sexual activity: Not on file  Other Topics Concern  . Not on file  Social History Narrative   Lives alone   Children do not live in town   Friend helps out: Cira Rue - [phone]   Independent prior to her fall 10/2016     Current Outpatient Medications:  .  acetaminophen (TYLENOL) 500 MG tablet, Take 1 tablet (500 mg total) by mouth 3 (three) times daily. (Patient taking differently: Take 500 mg by mouth as needed. ), Disp: 90 tablet, Rfl: 0 .  alendronate (FOSAMAX) 70 MG tablet, TAKE 1 TABLET WEEKLY, Disp: 12 tablet, Rfl: 1 .  amLODipine (NORVASC) 10 MG tablet, TAKE 1 TABLET BY MOUTH EVERY DAY, Disp: 90 tablet, Rfl: 0 .  BESIVANCE 0.6 % SUSP, 1 DROP IN SURGICAL EYE TWICE A DAY STARTING 1 DAY BEFORE SURGERY, Disp: , Rfl: 1 .  bimatoprost (LUMIGAN)  0.01 % SOLN, Place 1 drop into both eyes daily. , Disp: , Rfl:  .  conjugated estrogens (PREMARIN) vaginal cream, Place 1 Applicatorful vaginally daily., Disp: 42.5 g, Rfl: 5 .  fluticasone (FLONASE) 50 MCG/ACT nasal spray, SPRAY 2 SPRAYS INTO EACH NOSTRIL EVERY DAY, Disp: 16 g, Rfl: 1 .  glucose blood test strip, Use as instructed, Disp: 100 each, Rfl: 12 .  olmesartan (BENICAR) 20 MG  tablet, Take 1 tablet (20 mg total) by mouth daily., Disp: 90 tablet, Rfl: 0 .  omega-3 acid ethyl esters (LOVAZA) 1 g capsule, TAKE 1 CAPSULE (1 G TOTAL) BY MOUTH 2 (TWO) TIMES DAILY., Disp: 180 capsule, Rfl: 1 .  ONETOUCH DELICA LANCETS FINE MISC, 1 each by Does not apply route daily. 30 G Lancets- One Touch Delica, Disp: 941 each, Rfl: 1 .  [START ON 09/04/2017] oxyCODONE-acetaminophen (PERCOCET) 7.5-325 MG tablet, Take 1 tablet by mouth every 8 (eight) hours as needed for moderate pain or severe pain., Disp: 90 tablet, Rfl: 0 .  oxyCODONE-acetaminophen (PERCOCET) 7.5-325 MG tablet, Take 1 tablet by mouth every 8 (eight) hours as needed for moderate pain or severe pain., Disp: 90 tablet, Rfl: 0 .  pravastatin (PRAVACHOL) 40 MG tablet, TAKE 1 TABLET (40 MG TOTAL) BY MOUTH AT BEDTIME., Disp: 90 tablet, Rfl: 1 .  ranitidine (ZANTAC) 150 MG tablet, TAKE 1 TABLET BY MOUTH DAILY AS NEEDED FOR HEARTBURN, *TAKE THIS IN PLACCE OF OMEPRAZOLE*, Disp: 90 tablet, Rfl: 0 .  brimonidine-timolol (COMBIGAN) 0.2-0.5 % ophthalmic solution, Place 1 drop into both eyes See admin instructions. 1 drop into both eyes in the morning, then 1 drop into both eyes 8 hours later, Disp: , Rfl:  .  Cholecalciferol (VITAMIN D-1000 MAX ST) 1000 UNITS tablet, Take 1,000 Units by mouth daily. Reported on 08/05/2015, Disp: , Rfl:  .  Vitamin D, Ergocalciferol, (DRISDOL) 50000 units CAPS capsule, TAKE 1 CAPSULE (50,000 UNITS TOTAL) BY MOUTH EVERY 7 (SEVEN) DAYS. (Patient not taking: Reported on 08/05/2017), Disp: 12 capsule, Rfl: 0  Allergies  Allergen  Reactions  . Ace Inhibitors     cough     ROS  Constitutional: Negative for fever, no significant  weight change.  Respiratory: Negative for cough and shortness of breath.   Cardiovascular: Negative for chest pain or palpitations.  Gastrointestinal: Negative for abdominal pain, no bowel changes.  Musculoskeletal: positive  for gait problem and knee  joint swelling.  Skin: Negative for rash.  Neurological: Negative for dizziness or headache.  No other specific complaints in a complete review of systems (except as listed in HPI above).  Objective  Vitals:   08/05/17 1119  BP: (!) 148/72  Pulse: 81  Resp: 16  Temp: 98.3 F (36.8 C)  TempSrc: Oral  SpO2: 97%  Weight: 212 lb 9.6 oz (96.4 kg)  Height: 5\' 1"  (1.549 m)    Body mass index is 40.17 kg/m.  Physical Exam  Constitutional: Patient appears well-developed and well-nourished. Obese  No distress.  HEENT: head atraumatic, normocephalic, pupils equal and reactive to light,  neck supple, throat within normal limits Cardiovascular: Normal rate, regular rhythm and normal heart sounds.  Positive for  Murmur3/6 SEM  Trace BLE edema. Pulmonary/Chest: Effort normal and breath sounds normal. No respiratory distress. Abdominal: Soft.  There is no tenderness. Muscular skeletal: using walker, slow gait  Psychiatric: Patient has a normal mood and affect. behavior is normal. Judgment and thought content normal.  Recent Results (from the past 2160 hour(s))  ToxASSURE Select 13 (MW), Urine     Status: None   Collection Time: 05/27/17  2:40 PM  Result Value Ref Range   Summary FINAL     Comment: ==================================================================== TOXASSURE SELECT 13 (MW) ==================================================================== Test                             Result       Flag  Units Drug Present and Declared for Prescription Verification   Oxycodone                      494          EXPECTED    ng/mg creat   Oxymorphone                    164          EXPECTED   ng/mg creat   Noroxycodone                   540          EXPECTED   ng/mg creat    Sources of oxycodone include scheduled prescription medications.    Oxymorphone and noroxycodone are expected metabolites of    oxycodone. Oxymorphone is also available as a scheduled    prescription medication. ==================================================================== Test                      Result    Flag   Units      Ref Range   Creatinine              87               mg/dL      >=20 ==================================================================== Declared Medication s:  The flagging and interpretation on this report are based on the  following declared medications.  Unexpected results may arise from  inaccuracies in the declared medications.  **Note: The testing scope of this panel includes these medications:  Oxycodone (Percocet)  **Note: The testing scope of this panel does not include following  reported medications:  Acetaminophen  Acetaminophen (Percocet)  Alendronate (Fosamax)  Amlodipine (Norvasc)  Besifloxacin (Besivance)  Bimatoprost (Lumigan)  Brimonidine Tartrate  Cholecalciferol  Estrogen (Premarin)  Fluticasone (Flonase)  Ketoconazole (Nizoral)  Metformin (Glucophage)  Olmesartan (Benicar)  Omega-3 Fatty Acids (Lovaza)  Pravastatin (Pravachol)  Ranitidine (Zantac)  Timolol  Vitamin D2 (Drisdol) ==================================================================== For clinical consultation, please call [phone]. ====================================================================   [facility] DIABETES EYE EXAM     Status: None   Collection Time: 06/03/17 12:00 AM  Result Value Ref Range   HM Diabetic Eye Exam No Retinopathy No Retinopathy  POCT UA - Microalbumin     Status: Abnormal   Collection Time: 08/05/17 11:28 AM  Result Value Ref Range   Microalbumin Ur, POC 50 mg/L   Creatinine,  POC  mg/dL   Albumin/Creatinine Ratio, Urine, POC    POCT HgB A1C     Status: Abnormal   Collection Time: 08/05/17 11:30 AM  Result Value Ref Range   Hemoglobin A1C  4.0 - 5.6 %   HbA1c, POC (prediabetic range) 7.8 (A) 5.7 - 6.4 %   HbA1c, POC (controlled diabetic range)  0.0 - 7.0 %     PHQ2/9: Depression screen [facility] 2/9 08/05/2017 07/25/2017 05/27/2017 03/04/2017 01/07/2017  Decreased Interest 1 0 0 0 0  Down, Depressed, Hopeless 0 0 0 0 0  PHQ - 2 Score 1 0 0 0 0  Altered sleeping 0 - - - -  Tired, decreased energy 1 - - - -  Change in appetite 0 - - - -  Feeling bad or failure about yourself  0 - - - -  Trouble concentrating 0 - - - -  Moving slowly or fidgety/restless 0 - - - -  Suicidal thoughts 0 - - - -  PHQ-9 Score  2 - - - -  Difficult doing work/chores Not difficult at all - - - -     Fall Risk: Fall Risk  08/05/2017 07/25/2017 05/27/2017 04/03/2017 03/04/2017  Falls in the past year? No No No No No  Comment - - - - -  Number falls in past yr: - - - - -  Injury with Fall? - - - - -  Risk Factor Category  - - - - -    Functional Status Survey: Is the patient deaf or have difficulty hearing?: Yes Does the patient have difficulty seeing, even when wearing glasses/contacts?: Yes(glasses) Does the patient have difficulty concentrating, remembering, or making decisions?: No Does the patient have difficulty walking or climbing stairs?: Yes(uses a walker to walk) Does the patient have difficulty dressing or bathing?: No Does the patient have difficulty doing errands alone such as visiting a doctor's office or shopping?: No    Assessment & Plan  1. Type 2 diabetes mellitus with microalbuminuria, without long-term current use of insulin (HCC)  - POCT HgB A1C - POCT UA - Microalbumin - metFORMIN (GLUCOPHAGE-XR) 750 MG 24 hr tablet; Take 2 tablets (1,500 mg total) by mouth daily with breakfast.  Dispense: 180 tablet; Refill: 1 - COMPLETE METABOLIC PANEL WITH GFR  2.  Age-related osteoporosis without current pathological fracture  Waiting on follow up with Dr. Manfred Shirts  - alendronate (FOSAMAX) 70 MG tablet; TAKE 1 TABLET WEEKLY  Dispense: 12 tablet; Refill: 1 - Vitamin D, Ergocalciferol, (DRISDOL) 50000 units CAPS capsule; Take 1 capsule (50,000 Units total) by mouth every 7 (seven) days.  Dispense: 12 capsule; Refill: 0 - parathyroid - Vitamin D level  3. Essential hypertension  - amLODipine (NORVASC) 10 MG tablet; Take 1 tablet (10 mg total) by mouth daily.  Dispense: 90 tablet; Refill: 0 - olmesartan (BENICAR) 20 MG tablet; Take 1 tablet (20 mg total) by mouth daily.  Dispense: 90 tablet; Refill: 1 - COMPLETE METABOLIC PANEL WITH GFR  4. Pure hypercholesterolemia  - pravastatin (PRAVACHOL) 40 MG tablet; Take 1 tablet (40 mg total) by mouth at bedtime.  Dispense: 90 tablet; Refill: 1 - Lipid panel  5. Gastroesophageal reflux disease without esophagitis  - ranitidine (ZANTAC) 150 MG tablet; TAKE 1 TABLET BY MOUTH DAILY AS NEEDED FOR HEARTBURN,  Dispense: 90 tablet; Refill: 0  6. Aortic atherosclerosis (HCC)  Continue statin therapy bp is at goal for her  7. Morbid obesity ([place])  Discussed with the patient the risk posed by an increased BMI. Discussed importance of portion control, calorie counting and at least 150 minutes of physical activity weekly. Avoid sweet beverages and drink more water. Eat at least 6 servings of fruit and vegetables daily   8. Vitamin D deficiency  Continue rx vitamin D  -vitamin D level   9. Dyslipidemia associated with type 2 diabetes mellitus ([place])   10. Assistance needed with transportation  Cannot pick up walker on her own   51. Primary osteoarthritis involving multiple joints  Stable  12. Hand cramps  Discussed ncs but she is not wiling to to have it done at this time   13. Anemia, unspecified type  - Iron, TIBC and Ferritin Panel; Future

## 2017-08-06 ENCOUNTER — Other Ambulatory Visit: Payer: Self-pay | Admitting: Family Medicine

## 2017-08-06 ENCOUNTER — Encounter: Payer: Self-pay | Admitting: Family Medicine

## 2017-08-06 DIAGNOSIS — E213 Hyperparathyroidism, unspecified: Secondary | ICD-10-CM | POA: Insufficient documentation

## 2017-08-06 DIAGNOSIS — N183 Chronic kidney disease, stage 3 unspecified: Secondary | ICD-10-CM

## 2017-08-06 DIAGNOSIS — N182 Chronic kidney disease, stage 2 (mild): Secondary | ICD-10-CM

## 2017-08-06 DIAGNOSIS — N1831 Chronic kidney disease, stage 3a: Secondary | ICD-10-CM | POA: Insufficient documentation

## 2017-08-06 LAB — COMPLETE METABOLIC PANEL WITH GFR
AG Ratio: 1.8 (calc) (ref 1.0–2.5)
ALT: 6 U/L (ref 6–29)
AST: 8 U/L — ABNORMAL LOW (ref 10–35)
Albumin: 4.4 g/dL (ref 3.6–5.1)
Alkaline phosphatase (APISO): 59 U/L (ref 33–130)
BUN/Creatinine Ratio: 17 (calc) (ref 6–22)
BUN: 22 mg/dL (ref 7–25)
CO2: 24 mmol/L (ref 20–32)
Calcium: 9.7 mg/dL (ref 8.6–10.4)
Chloride: 106 mmol/L (ref 98–110)
Creat: 1.31 mg/dL — ABNORMAL HIGH (ref 0.60–0.88)
GFR, Est African American: 44 mL/min/{1.73_m2} — ABNORMAL LOW (ref >=60)
GFR, Est Non African American: 38 mL/min/{1.73_m2} — ABNORMAL LOW (ref >=60)
Globulin: 2.5 g/dL (calc) (ref 1.9–3.7)
Glucose, Bld: 169 mg/dL — ABNORMAL HIGH (ref 65–139)
Potassium: 4.5 mmol/L (ref 3.5–5.3)
Sodium: 142 mmol/L (ref 135–146)
Total Bilirubin: 0.3 mg/dL (ref 0.2–1.2)
Total Protein: 6.9 g/dL (ref 6.1–8.1)

## 2017-08-06 LAB — CBC WITH DIFFERENTIAL/PLATELET
Basophils Absolute: 28 cells/uL (ref 0–200)
Basophils Relative: 0.4 %
Eosinophils Absolute: 99 cells/uL (ref 15–500)
Eosinophils Relative: 1.4 %
HCT: 34.6 % — ABNORMAL LOW (ref 35.0–45.0)
Hemoglobin: 11.2 g/dL — ABNORMAL LOW (ref 11.7–15.5)
Lymphs Abs: 1924 cells/uL (ref 850–3900)
MCH: 27 pg (ref 27.0–33.0)
MCHC: 32.4 g/dL (ref 32.0–36.0)
MCV: 83.4 fL (ref 80.0–100.0)
MPV: 9.8 fL (ref 7.5–12.5)
Monocytes Relative: 9 %
Neutro Abs: 4409 cells/uL (ref 1500–7800)
Neutrophils Relative %: 62.1 %
Platelets: 328 10*3/uL (ref 140–400)
RBC: 4.15 10*6/uL (ref 3.80–5.10)
RDW: 13.4 % (ref 11.0–15.0)
Total Lymphocyte: 27.1 %
WBC mixed population: 639 cells/uL (ref 200–950)
WBC: 7.1 10*3/uL (ref 3.8–10.8)

## 2017-08-06 LAB — PARATHYROID HORMONE, INTACT (NO CA): PTH: 129 pg/mL — ABNORMAL HIGH (ref 14–64)

## 2017-08-06 LAB — IRON,TIBC AND FERRITIN PANEL
%SAT: 14 % (calc) — ABNORMAL LOW (ref 16–45)
Ferritin: 13 ng/mL — ABNORMAL LOW (ref 16–288)
Iron: 48 ug/dL (ref 45–160)
TIBC: 340 mcg/dL (calc) (ref 250–450)

## 2017-08-06 LAB — LIPID PANEL
Cholesterol: 125 mg/dL (ref <200)
HDL: 50 mg/dL — ABNORMAL LOW (ref >50)
LDL Cholesterol (Calc): 54 mg/dL (calc)
Non-HDL Cholesterol (Calc): 75 mg/dL (calc) (ref <130)
Total CHOL/HDL Ratio: 2.5 (calc) (ref <5.0)
Triglycerides: 128 mg/dL (ref <150)

## 2017-08-06 LAB — VITAMIN D 25 HYDROXY (VIT D DEFICIENCY, FRACTURES): Vit D, 25-Hydroxy: 29 ng/mL — ABNORMAL LOW (ref 30–100)

## 2017-08-20 ENCOUNTER — Other Ambulatory Visit: Payer: Self-pay | Admitting: Family Medicine

## 2017-08-20 DIAGNOSIS — IMO0002 Reserved for concepts with insufficient information to code with codable children: Secondary | ICD-10-CM

## 2017-08-20 DIAGNOSIS — E1129 Type 2 diabetes mellitus with other diabetic kidney complication: Secondary | ICD-10-CM

## 2017-08-20 DIAGNOSIS — R809 Proteinuria, unspecified: Principal | ICD-10-CM

## 2017-08-20 DIAGNOSIS — E1165 Type 2 diabetes mellitus with hyperglycemia: Secondary | ICD-10-CM

## 2017-08-23 ENCOUNTER — Ambulatory Visit
Admission: RE | Admit: 2017-08-23 | Discharge: 2017-08-23 | Disposition: A | Discharge status: Home / Self Care | Payer: Medicare Other | Source: Ambulatory Visit | Attending: Family Medicine | Admitting: Family Medicine

## 2017-08-23 DIAGNOSIS — Z1231 Encounter for screening mammogram for malignant neoplasm of breast: Secondary | ICD-10-CM | POA: Diagnosis not present

## 2017-09-23 ENCOUNTER — Other Ambulatory Visit: Payer: Self-pay

## 2017-09-23 ENCOUNTER — Ambulatory Visit: Payer: Medicare Other | Attending: Nurse Practitioner | Admitting: Nurse Practitioner

## 2017-09-23 ENCOUNTER — Encounter: Payer: Self-pay | Admitting: Nurse Practitioner

## 2017-09-23 VITALS — BP 145/60 | HR 77 | Temp 98.0°F | Ht 61.0 in | Wt 212.0 lb

## 2017-09-23 DIAGNOSIS — Z951 Presence of aortocoronary bypass graft: Secondary | ICD-10-CM | POA: Insufficient documentation

## 2017-09-23 DIAGNOSIS — Z7951 Long term (current) use of inhaled steroids: Secondary | ICD-10-CM | POA: Diagnosis not present

## 2017-09-23 DIAGNOSIS — E213 Hyperparathyroidism, unspecified: Secondary | ICD-10-CM | POA: Insufficient documentation

## 2017-09-23 DIAGNOSIS — M1990 Unspecified osteoarthritis, unspecified site: Secondary | ICD-10-CM | POA: Insufficient documentation

## 2017-09-23 DIAGNOSIS — M159 Polyosteoarthritis, unspecified: Secondary | ICD-10-CM | POA: Diagnosis not present

## 2017-09-23 DIAGNOSIS — E785 Hyperlipidemia, unspecified: Secondary | ICD-10-CM | POA: Insufficient documentation

## 2017-09-23 DIAGNOSIS — G894 Chronic pain syndrome: Secondary | ICD-10-CM | POA: Diagnosis not present

## 2017-09-23 DIAGNOSIS — D509 Iron deficiency anemia, unspecified: Secondary | ICD-10-CM | POA: Insufficient documentation

## 2017-09-23 DIAGNOSIS — Z7984 Long term (current) use of oral hypoglycemic drugs: Secondary | ICD-10-CM | POA: Diagnosis not present

## 2017-09-23 DIAGNOSIS — R809 Proteinuria, unspecified: Secondary | ICD-10-CM | POA: Diagnosis not present

## 2017-09-23 DIAGNOSIS — N200 Calculus of kidney: Secondary | ICD-10-CM | POA: Insufficient documentation

## 2017-09-23 DIAGNOSIS — I129 Hypertensive chronic kidney disease with stage 1 through stage 4 chronic kidney disease, or unspecified chronic kidney disease: Secondary | ICD-10-CM | POA: Insufficient documentation

## 2017-09-23 DIAGNOSIS — M79641 Pain in right hand: Secondary | ICD-10-CM | POA: Insufficient documentation

## 2017-09-23 DIAGNOSIS — M109 Gout, unspecified: Secondary | ICD-10-CM | POA: Diagnosis not present

## 2017-09-23 DIAGNOSIS — K429 Umbilical hernia without obstruction or gangrene: Secondary | ICD-10-CM | POA: Insufficient documentation

## 2017-09-23 DIAGNOSIS — G8929 Other chronic pain: Secondary | ICD-10-CM

## 2017-09-23 DIAGNOSIS — Z79891 Long term (current) use of opiate analgesic: Secondary | ICD-10-CM | POA: Diagnosis not present

## 2017-09-23 DIAGNOSIS — I700 Atherosclerosis of aorta: Secondary | ICD-10-CM | POA: Insufficient documentation

## 2017-09-23 DIAGNOSIS — E559 Vitamin D deficiency, unspecified: Secondary | ICD-10-CM | POA: Insufficient documentation

## 2017-09-23 DIAGNOSIS — E1122 Type 2 diabetes mellitus with diabetic chronic kidney disease: Secondary | ICD-10-CM | POA: Diagnosis not present

## 2017-09-23 DIAGNOSIS — H409 Unspecified glaucoma: Secondary | ICD-10-CM | POA: Diagnosis not present

## 2017-09-23 DIAGNOSIS — B354 Tinea corporis: Secondary | ICD-10-CM | POA: Diagnosis not present

## 2017-09-23 DIAGNOSIS — M25561 Pain in right knee: Secondary | ICD-10-CM | POA: Insufficient documentation

## 2017-09-23 DIAGNOSIS — H4010X0 Unspecified open-angle glaucoma, stage unspecified: Secondary | ICD-10-CM | POA: Insufficient documentation

## 2017-09-23 DIAGNOSIS — M79642 Pain in left hand: Secondary | ICD-10-CM | POA: Diagnosis not present

## 2017-09-23 DIAGNOSIS — Z79899 Other long term (current) drug therapy: Secondary | ICD-10-CM | POA: Insufficient documentation

## 2017-09-23 DIAGNOSIS — K219 Gastro-esophageal reflux disease without esophagitis: Secondary | ICD-10-CM | POA: Diagnosis not present

## 2017-09-23 DIAGNOSIS — N183 Chronic kidney disease, stage 3 (moderate): Secondary | ICD-10-CM | POA: Diagnosis not present

## 2017-09-23 DIAGNOSIS — Z6841 Body Mass Index (BMI) 40.0 and over, adult: Secondary | ICD-10-CM | POA: Insufficient documentation

## 2017-09-23 DIAGNOSIS — E669 Obesity, unspecified: Secondary | ICD-10-CM | POA: Insufficient documentation

## 2017-09-23 MED ORDER — OXYCODONE-ACETAMINOPHEN 7.5-325 MG PO TABS: 1.0000 | ORAL_TABLET | Freq: Three times a day (TID) | ORAL | 0 refills | Status: DC | PRN

## 2017-09-23 NOTE — Patient Instructions (Signed)
____________________________________________________________________________________________  Medication Rules  Applies to: All patients receiving prescriptions (written or electronic).  Pharmacy of record: Pharmacy where electronic prescriptions will be sent. If written prescriptions are taken to a different pharmacy, please inform the nursing staff. The pharmacy listed in the electronic medical record should be the one where you would like electronic prescriptions to be sent.  Prescription refills: Only during scheduled appointments. Applies to both, written and electronic prescriptions.  NOTE: The following applies primarily to controlled substances (Opioid* Pain Medications).   Patient's responsibilities: 1. Pain Pills: Bring all pain pills to every appointment (except for procedure appointments). 2. Pill Bottles: Bring pills in original pharmacy bottle. Always bring newest bottle. Bring bottle, even if empty. 3. Medication refills: You are responsible for knowing and keeping track of what medications you need refilled. The day before your appointment, write a list of all prescriptions that need to be refilled. Bring that list to your appointment and give it to the admitting nurse. Prescriptions will be written only during appointments. If you forget a medication, it will not be "Called in", "Faxed", or "electronically sent". You will need to get another appointment to get these prescribed. 4. Prescription Accuracy: You are responsible for carefully inspecting your prescriptions before leaving our office. Have the discharge nurse carefully go over each prescription with you, before taking them home. Make sure that your name is accurately spelled, that your address is correct. Check the name and dose of your medication to make sure it is accurate. Check the number of pills, and the written instructions to make sure they are clear and accurate. Make sure that you are given enough medication to last  until your next medication refill appointment. 5. Taking Medication: Take medication as prescribed. Never take more pills than instructed. Never take medication more frequently than prescribed. Taking less pills or less frequently is permitted and encouraged, when it comes to controlled substances (written prescriptions).  6. Inform other Doctors: Always inform, all of your healthcare providers, of all the medications you take. 7. Pain Medication from other Providers: You are not allowed to accept any additional pain medication from any other Doctor or Healthcare provider. There are two exceptions to this rule. (see below) In the event that you require additional pain medication, you are responsible for notifying us, as stated below. 8. Medication Agreement: You are responsible for carefully reading and following our Medication Agreement. This must be signed before receiving any prescriptions from our practice. Safely store a copy of your signed Agreement. Violations to the Agreement will result in no further prescriptions. (Additional copies of our Medication Agreement are available upon request.) 9. Laws, Rules, & Regulations: All patients are expected to follow all Federal and State Laws, Statutes, Rules, & Regulations. Ignorance of the Laws does not constitute a valid excuse. The use of any illegal substances is prohibited. 10. Adopted CDC guidelines & recommendations: Target dosing levels will be at or below 60 MME/day. Use of benzodiazepines** is not recommended.  Exceptions: There are only two exceptions to the rule of not receiving pain medications from other Healthcare Providers. 1. Exception #1 (Emergencies): In the event of an emergency (i.e.: accident requiring emergency care), you are allowed to receive additional pain medication. However, you are responsible for: As soon as you are able, call our office [phone], at any time of the day or night, and leave a message stating your name, the  date and nature of the emergency, and the name and dose of the medication   prescribed. In the event that your call is answered by a member of our staff, make sure to document and save the date, time, and the name of the person that took your information.  2. Exception #2 (Planned Surgery): In the event that you are scheduled by another doctor or dentist to have any type of surgery or procedure, you are allowed (for a period no longer than 30 days), to receive additional pain medication, for the acute post-op pain. However, in this case, you are responsible for picking up a copy of our "Post-op Pain Management for Surgeons" handout, and giving it to your surgeon or dentist. This document is available at our office, and does not require an appointment to obtain it. Simply go to our office during business hours (Monday-Thursday from 8:00 AM to 4:00 PM) (Friday 8:00 AM to 12:00 Noon) or if you have a scheduled appointment with [place], prior to your surgery, and ask for it by name. In addition, you will need to provide [place] with your name, name of your surgeon, type of surgery, and date of procedure or surgery.  *Opioid medications include: morphine, codeine, oxycodone, oxymorphone, hydrocodone, hydromorphone, meperidine, tramadol, tapentadol, buprenorphine, fentanyl, methadone. **Benzodiazepine medications include: diazepam (Valium), alprazolam (Xanax), clonazepam (Klonopine), lorazepam (Ativan), clorazepate (Tranxene), chlordiazepoxide (Librium), estazolam (Prosom), oxazepam (Serax), temazepam (Restoril), triazolam (Halcion) (Last updated: 04/18/2017) ____________________________________________________________________________________________    BMI Assessment: Estimated body mass index is 40.06 kg/m as calculated from the following:   Height as of this encounter: 5\' 1"  (1.549 m).   Weight as of this encounter: 212 lb (96.2 kg).  BMI interpretation table: BMI level Category Range association with higher  incidence of chronic pain  <18 kg/m2 Underweight   18.5-24.9 kg/m2 Ideal body weight   25-29.9 kg/m2 Overweight Increased incidence by 20%  30-34.9 kg/m2 Obese (Class I) Increased incidence by 68%  35-39.9 kg/m2 Severe obesity (Class II) Increased incidence by 136%  >40 kg/m2 Extreme obesity (Class III) Increased incidence by 254%   Patient's current BMI Ideal Body weight  Body mass index is 40.06 kg/m. Ideal body weight: 47.8 kg (105 lb 6.1 oz) Adjusted ideal body weight: 67.1 kg (148 lb 0.5 oz)   BMI Readings from Last 4 Encounters:  09/23/17 40.06 kg/m  08/05/17 40.17 kg/m  07/25/17 40.25 kg/m  05/27/17 40.25 kg/m   Wt Readings from Last 4 Encounters:  09/23/17 212 lb (96.2 kg)  08/05/17 212 lb 9.6 oz (96.4 kg)  07/25/17 213 lb (96.6 kg)  05/27/17 213 lb (96.6 kg)

## 2017-09-23 NOTE — Progress Notes (Signed)
Patient's Name: Taylor [name]  MRN: [id number]  Referring Provider: [name], MD  DOB: [birth year]  PCP: [name], MD  DOS: 09/23/2017  Note by: Vevelyn Francois NP  Service setting: Ambulatory outpatient  Specialty: Interventional Pain Management  Location: [facility] (AMB) Pain Management Facility    Patient type: Established    Primary Reason(s) for Visit: Encounter for prescription drug management. (Level of risk: moderate)  CC: Knee Pain  HPI  Taylor [name] is a 84 y.o. year old, female patient, who comes today for a medication management evaluation. She has Lump or mass in breast; Umbilical hernia; Osteoarthritis, multiple sites; Type 2 diabetes mellitus with microalbuminuria ([place]); Glaucoma; BP (high blood pressure); Chronic pain; Tinea corporis; Calculus of kidney; Vaginal dryness; Dyslipidemia associated with type 2 diabetes mellitus ([place]); Chronic midline low back pain without sciatica; Chronic pain of right knee; Long term current use of opiate analgesic; Primary open angle glaucoma (POAG) of both eyes, severe stage; Hypertensive retinopathy; Vitamin D deficiency; Anemia, unspecified; Chronic pain syndrome; Aortic atherosclerosis ([place]); Anterolisthesis; Iron deficiency anemia; Assistance needed with transportation; Osteoporotic compression fracture of spine ([place]); HLD (hyperlipidemia); Hyperparathyroidism ([place]); Chronic kidney disease, stage III (moderate) (HCC); and Pain in both hands on their problem list. Her primarily concern today is the Knee Pain  Pain Assessment: Location: Right, Left Knee(both knees and both hands) Radiating: Denies Onset: More than a month ago Duration: Chronic pain Quality: Aching, Throbbing Severity: 6 /10 (subjective, self-reported pain score)  Note: Reported level is compatible with observation. Clinically the patient looks like a 2/10 A 2/10 is viewed as "Mild to Moderate" and described as noticeable and distracting. Impossible to hide from other  people. More frequent flare-ups. Still possible to adapt and function close to normal. It can be very annoying and may have occasional stronger flare-ups. With discipline, patients may get used to it and adapt.       When using our objective Pain Scale, levels between 6 and 10/10 are said to belong in an emergency room, as it progressively worsens from a 6/10, described as severely limiting, requiring emergency care not usually available at an outpatient pain management facility. At a 6/10 level, communication becomes difficult and requires great effort. Assistance to reach the emergency department may be required. Facial flushing and profuse sweating along with potentially dangerous increases in heart rate and blood pressure will be evident. Effect on ADL: limits my daily activities Timing: Intermittent Modifying factors: ice, heat, ortho gloves, medications BP: (!) 145/60  HR: 77  Taylor [name] was last scheduled for an appointment on 07/25/2017 for medication management. During today's appointment we reviewed Taylor [name]'s chronic pain status, as well as her outpatient medication regimen. She admits that she continues to have the cramps in her hands. She declined any labs at last visit.   The patient  reports that she does not use drugs. Her body mass index is 40.06 kg/m.  Further details on both, my assessment(s), as well as the proposed treatment plan, please see below.  Controlled Substance Pharmacotherapy Assessment REMS (Risk Evaluation and Mitigation Strategy)  Analgesic:Oxycodone/acetaminophen 7.5/325 mg 3times daily MME/day:33.75m/day.   BChauncey Fischer RN  09/23/2017  1:26 PM  Sign at close encounter Nursing Pain Medication Assessment:  Safety precautions to be maintained throughout the outpatient stay will include: orient to surroundings, keep bed in low position, maintain call bell within reach at all times, provide assistance with transfer out of bed and ambulation.  Medication  Inspection Compliance: Pill count conducted  under aseptic conditions, in front of the patient. Neither the pills nor the bottle was removed from the patient's sight at any time. Once count was completed pills were immediately returned to the patient in their original bottle.  Medication: Oxycodone/APAP Pill/Patch Count: 42 of 90 pills remain Pill/Patch Appearance: Markings consistent with prescribed medication Bottle Appearance: Standard pharmacy container. Clearly labeled. Filled Date: 7 / 17 / 2019 Last Medication intake:  Today   Pharmacokinetics: Liberation and absorption (onset of action): WNL Distribution (time to peak effect): WNL Metabolism and excretion (duration of action): WNL         Pharmacodynamics: Desired effects: Analgesia: Taylor [name] reports >50% benefit. Functional ability: Patient reports that medication allows her to accomplish basic ADLs Clinically meaningful improvement in function (CMIF): Sustained CMIF goals met Perceived effectiveness: Described as relatively effective, allowing for increase in activities of daily living (ADL) Undesirable effects: Side-effects or Adverse reactions: None reported Monitoring: [place] PMP: Online review of the past 28-monthperiod conducted. Compliant with practice rules and regulations Last UDS on record: Summary  Date Value Ref Range Status  05/27/2017 FINAL  Final    Comment:    ==================================================================== TOXASSURE SELECT 13 (MW) ==================================================================== Test                             Result       Flag       Units Drug Present and Declared for Prescription Verification   Oxycodone                      494          EXPECTED   ng/mg creat   Oxymorphone                    164          EXPECTED   ng/mg creat   Noroxycodone                   540          EXPECTED   ng/mg creat    Sources of oxycodone include scheduled prescription medications.     Oxymorphone and noroxycodone are expected metabolites of    oxycodone. Oxymorphone is also available as a scheduled    prescription medication. ==================================================================== Test                      Result    Flag   Units      Ref Range   Creatinine              87               mg/dL      >=20 ==================================================================== Declared Medications:  The flagging and interpretation on this report are based on the  following declared medications.  Unexpected results may arise from  inaccuracies in the declared medications.  **Note: The testing scope of this panel includes these medications:  Oxycodone (Percocet)  **Note: The testing scope of this panel does not include following  reported medications:  Acetaminophen  Acetaminophen (Percocet)  Alendronate (Fosamax)  Amlodipine (Norvasc)  Besifloxacin (Besivance)  Bimatoprost (Lumigan)  Brimonidine Tartrate  Cholecalciferol  Estrogen (Premarin)  Fluticasone (Flonase)  Ketoconazole (Nizoral)  Metformin (Glucophage)  Olmesartan (Benicar)  Omega-3 Fatty Acids (Lovaza)  Pravastatin (Pravachol)  Ranitidine (Zantac)  Timolol  Vitamin D2 (Drisdol) ==================================================================== For clinical consultation, please  call [phone]. ====================================================================    UDS interpretation: Compliant          Medication Assessment Form: Reviewed. Patient indicates being compliant with therapy Treatment compliance: Compliant Risk Assessment Profile: Aberrant behavior: See prior evaluations. None observed or detected today Comorbid factors increasing risk of overdose: See prior notes. No additional risks detected today Risk of substance use disorder (SUD): Low Opioid Risk Tool - 09/23/17 1323      Family History of Substance Abuse   Alcohol  Negative    Illegal Drugs  Negative    Rx  Drugs  Negative      Personal History of Substance Abuse   Alcohol  Negative    Illegal Drugs  Negative    Rx Drugs  Negative      Total Score   Opioid Risk Tool Scoring  0    Opioid Risk Interpretation  Low Risk      ORT Scoring interpretation table:  Score <3 = Low Risk for SUD  Score between 4-7 = Moderate Risk for SUD  Score >8 = High Risk for Opioid Abuse   Risk Mitigation Strategies:  Patient Counseling: Covered Patient-Prescriber Agreement (PPA): Present and active  Notification to other healthcare providers: Done  Pharmacologic Plan: No change in therapy, at this time.             Laboratory Chemistry  Inflammation Markers (CRP: Acute Phase) (ESR: Chronic Phase) No results found for: CRP, ESRSEDRATE, LATICACIDVEN                       Rheumatology Markers No results found for: RF, ANA, LABURIC, URICUR, LYMEIGGIGMAB, LYMEABIGMQN, HLAB27                      Renal Function Markers Lab Results  Component Value Date   BUN 22 08/05/2017   CREATININE 1.31 (H) 08/05/2017   BCR 17 08/05/2017   GFRAA 44 (L) 08/05/2017   GFRNONAA 38 (L) 08/05/2017                             Hepatic Function Markers Lab Results  Component Value Date   AST 8 (L) 08/05/2017   ALT 6 08/05/2017   ALBUMIN 4.0 08/14/2016   ALKPHOS 56 08/14/2016                        Electrolytes Lab Results  Component Value Date   NA 142 08/05/2017   K 4.5 08/05/2017   CL 106 08/05/2017   CALCIUM 9.7 08/05/2017                        Neuropathy Markers Lab Results  Component Value Date   HGBA1C 7.8 (A) 08/05/2017                        Bone Pathology Markers Lab Results  Component Value Date   VD25OH 29 (L) 08/05/2017                         Coagulation Parameters Lab Results  Component Value Date   PLT 328 08/05/2017                        Cardiovascular Markers Lab Results  Component Value Date   HGB 11.2 (  L) 08/05/2017   HCT 34.6 (L) 08/05/2017                          CA Markers No results found for: CEA, CA125, LABCA2                      Note: Lab results reviewed.  Recent Diagnostic Imaging Results  MM 3D SCREEN BREAST BILATERAL CLINICAL DATA:  Screening.  EXAM: DIGITAL SCREENING BILATERAL MAMMOGRAM WITH TOMO AND CAD  COMPARISON:  Previous exam(s).  ACR Breast Density Category b: There are scattered areas of fibroglandular density.  FINDINGS: There are no findings suspicious for malignancy. Images were processed with CAD.  IMPRESSION: No mammographic evidence of malignancy. A result letter of this screening mammogram will be mailed directly to the patient.  RECOMMENDATION: Screening mammogram in one year. (Code:[id number]-01Y)  BI-RADS CATEGORY  1: Negative.  Electronically Signed   By: Lillia Mountain M.D.   On: 08/23/2017 12:26  Complexity Note: Imaging results reviewed. Results shared with Ms. Albaugh, using [employer].                         Meds   Current Outpatient Medications:  .  acetaminophen (TYLENOL) 500 MG tablet, Take 1 tablet (500 mg total) by mouth 3 (three) times daily. (Patient taking differently: Take 500 mg by mouth as needed. ), Disp: 90 tablet, Rfl: 0 .  alendronate (FOSAMAX) 70 MG tablet, TAKE 1 TABLET WEEKLY, Disp: 12 tablet, Rfl: 1 .  amLODipine (NORVASC) 10 MG tablet, Take 1 tablet (10 mg total) by mouth daily., Disp: 90 tablet, Rfl: 0 .  BESIVANCE 0.6 % SUSP, 1 DROP IN SURGICAL EYE TWICE A DAY STARTING 1 DAY BEFORE SURGERY, Disp: , Rfl: 1 .  bimatoprost (LUMIGAN) 0.01 % SOLN, Place 1 drop into both eyes daily. , Disp: , Rfl:  .  brimonidine-timolol (COMBIGAN) 0.2-0.5 % ophthalmic solution, Place 1 drop into both eyes See admin instructions. 1 drop into both eyes in the morning, then 1 drop into both eyes 8 hours later, Disp: , Rfl:  .  Cholecalciferol (VITAMIN D-1000 MAX ST) 1000 UNITS tablet, Take 1,000 Units by mouth daily. Reported on 08/05/2015, Disp: , Rfl:  .  conjugated estrogens (PREMARIN) vaginal  cream, Place 1 Applicatorful vaginally daily., Disp: 42.5 g, Rfl: 5 .  fluticasone (FLONASE) 50 MCG/ACT nasal spray, SPRAY 2 SPRAYS INTO EACH NOSTRIL EVERY DAY, Disp: 16 g, Rfl: 1 .  glucose blood test strip, Use as instructed, Disp: 100 each, Rfl: 12 .  metFORMIN (GLUCOPHAGE-XR) 750 MG 24 hr tablet, Take 2 tablets (1,500 mg total) by mouth daily with breakfast., Disp: 180 tablet, Rfl: 1 .  olmesartan (BENICAR) 20 MG tablet, Take 1 tablet (20 mg total) by mouth daily., Disp: 90 tablet, Rfl: 1 .  omega-3 acid ethyl esters (LOVAZA) 1 g capsule, TAKE 1 CAPSULE (1 G TOTAL) BY MOUTH 2 (TWO) TIMES DAILY., Disp: 180 capsule, Rfl: 1 .  ONETOUCH DELICA LANCETS FINE MISC, 1 each by Does not apply route daily. 30 G Lancets- One Touch Delica, Disp: 841 each, Rfl: 1 .  [START ON 11/03/2017] oxyCODONE-acetaminophen (PERCOCET) 7.5-325 MG tablet, Take 1 tablet by mouth every 8 (eight) hours as needed for moderate pain or severe pain., Disp: 90 tablet, Rfl: 0 .  pravastatin (PRAVACHOL) 40 MG tablet, Take 1 tablet (40 mg total) by mouth at bedtime., Disp: 90 tablet, Rfl: 1 .  ranitidine (ZANTAC) 150 MG tablet, TAKE 1 TABLET BY MOUTH DAILY AS NEEDED FOR HEARTBURN,, Disp: 90 tablet, Rfl: 0 .  Vitamin D, Ergocalciferol, (DRISDOL) 50000 units CAPS capsule, Take 1 capsule (50,000 Units total) by mouth every 7 (seven) days., Disp: 12 capsule, Rfl: 0 .  [START ON 10/04/2017] oxyCODONE-acetaminophen (PERCOCET) 7.5-325 MG tablet, Take 1 tablet by mouth every 8 (eight) hours as needed for moderate pain or severe pain., Disp: 90 tablet, Rfl: 0  ROS  Constitutional: Denies any fever or chills Gastrointestinal: No reported hemesis, hematochezia, vomiting, or acute GI distress Musculoskeletal: Denies any acute onset joint swelling, redness, loss of ROM, or weakness Neurological: No reported episodes of acute onset apraxia, aphasia, dysarthria, agnosia, amnesia, paralysis, loss of coordination, or loss of consciousness  Allergies   Ms. Splinter is allergic to ace inhibitors.  Friendly  Drug: Ms. Doubrava  reports that she does not use drugs. Alcohol:  reports that she does not drink alcohol. Tobacco:  reports that she has never smoked. She has never used smokeless tobacco. Medical:  has a past medical history of Abnormal mammogram, unspecified (2013), Breast screening, unspecified (2013), Diabetes mellitus without complication ([place]) (0254), GERD (gastroesophageal reflux disease), Glaucoma (2003), Gout, Hyperlipidemia (2008), Hypertension (1980's), Lump or mass in breast (01/03/2012), Obesity, unspecified (2013), Osteoporosis, Shingles (2013), and Special screening for malignant neoplasms, colon (2013). Surgical: Ms. Burgard  has a past surgical history that includes Abdominal hysterectomy (1958); Knee surgery (2009,2011,2012); Lipoma excision (1998); Spine surgery (2004); Tonsillectomy; Colonoscopy (2003); Coronary artery bypass graft (Left, 01/03/2012,01/24/2012); Cataract extraction, bilateral (Bilateral); Cholecystectomy; Back surgery; Knee surgery; and Breast biopsy (Left, 01/25/2012). Family: family history includes Cancer in her mother; Heart disease in her father; Stroke in her mother.  Constitutional Exam  General appearance: Well nourished, well developed, and well hydrated. In no apparent acute distress Vitals:   09/23/17 1307  BP: (!) 145/60  Pulse: 77  Temp: 98 F (36.7 C)  SpO2: 95%  Weight: 212 lb (96.2 kg)  Height: 5' 1"  (1.549 m)  Psych/Mental status: Alert, oriented x 3 (person, place, & time)       Eyes: PERLA Respiratory: No evidence of acute respiratory distress  Cervical Spine Area Exam  Skin & Axial Inspection: No masses, redness, edema, swelling, or associated skin lesions Alignment: Symmetrical Functional ROM: Unrestricted ROM      Stability: No instability detected Muscle Tone/Strength: Functionally intact. No obvious neuro-muscular anomalies detected. Sensory (Neurological):  Unimpaired Palpation: No palpable anomalies              Upper Extremity (UE) Exam    Side: Right upper extremity  Side: Left upper extremity  Skin & Extremity Inspection: Bouchard's nodes (PIP)  Skin & Extremity Inspection: Bouchard's nodes (PIP)  Functional ROM: Unrestricted ROM          Functional ROM: Unrestricted ROM          Muscle Tone/Strength: Functionally intact. No obvious neuro-muscular anomalies detected.  Muscle Tone/Strength: Functionally intact. No obvious neuro-muscular anomalies detected.  Sensory (Neurological): Unimpaired          Sensory (Neurological): Unimpaired          Palpation: No palpable anomalies              Palpation: No palpable anomalies              Provocative Test(s):  Phalen's test: deferred Tinel's test: deferred Apley's scratch test (touch opposite shoulder):  Action 1 (Across chest): deferred Action 2 (Overhead): deferred Action 3 (  LB reach): deferred   Provocative Test(s):  Phalen's test: deferred Tinel's test: deferred Apley's scratch test (touch opposite shoulder):  Action 1 (Across chest): deferred Action 2 (Overhead): deferred Action 3 (LB reach): deferred    Thoracic Spine Area Exam  Skin & Axial Inspection: No masses, redness, or swelling Alignment: Symmetrical Functional ROM: Unrestricted ROM Stability: No instability detected Muscle Tone/Strength: Functionally intact. No obvious neuro-muscular anomalies detected. Sensory (Neurological): Unimpaired Muscle strength & Tone: No palpable anomalies  Lumbar Spine Area Exam  Skin & Axial Inspection: No masses, redness, or swelling Alignment: Symmetrical Functional ROM: Unrestricted ROM       Stability: No instability detected Muscle Tone/Strength: Functionally intact. No obvious neuro-muscular anomalies detected. Sensory (Neurological): Unimpaired Palpation: No palpable anomalies         Gait & Posture Assessment  Ambulation: Patient ambulates using a walker Gait: Limited. Using  assistive device to ambulate Posture: WNL   Lower Extremity Exam    Side: Right lower extremity  Side: Left lower extremity  Stability: No instability observed          Stability: No instability observed          Skin & Extremity Inspection: Edema   scar from previous surgery  Skin & Extremity Inspection: Skin color, temperature, and hair growth are WNL. No peripheral edema or cyanosis. No masses, redness, swelling, asymmetry, or associated skin lesions. No contractures.  Functional ROM: Decreased ROM for knee joint          Functional ROM: Unrestricted ROM                  Muscle Tone/Strength: Functionally intact. No obvious neuro-muscular anomalies detected.  Muscle Tone/Strength: Functionally intact. No obvious neuro-muscular anomalies detected.  Sensory (Neurological): Unimpaired  Sensory (Neurological): Unimpaired  Palpation: No palpable anomalies  Palpation: No palpable anomalies   Assessment  Primary Diagnosis & Pertinent Problem List: The primary encounter diagnosis was Osteoarthritis of multiple joints, unspecified osteoarthritis type. Diagnoses of Pain in both hands, Chronic pain of right knee, and Chronic pain syndrome were also pertinent to this visit.  Status Diagnosis  Persistent Persistent Persistent 1. Osteoarthritis of multiple joints, unspecified osteoarthritis type   2. Pain in both hands   3. Chronic pain of right knee   4. Chronic pain syndrome     Problems updated and reviewed during this visit: Problem  Pain in Both Hands   Plan of Care  Pharmacotherapy (Medications Ordered): Meds ordered this encounter  Medications  . oxyCODONE-acetaminophen (PERCOCET) 7.5-325 MG tablet    Sig: Take 1 tablet by mouth every 8 (eight) hours as needed for moderate pain or severe pain.    Dispense:  90 tablet    Refill:  0    Do not place this medication, or any other prescription from our practice, on "Automatic Refill". Patient may have prescription filled one day early  if pharmacy is closed on scheduled refill date. Do not fill until: 11/03/2017 To last until:12/03/2017    Order Specific Question:   Supervising Provider    Answer:   Milinda Pointer [phone]  . oxyCODONE-acetaminophen (PERCOCET) 7.5-325 MG tablet    Sig: Take 1 tablet by mouth every 8 (eight) hours as needed for moderate pain or severe pain.    Dispense:  90 tablet    Refill:  0    Do not place this medication, or any other prescription from our practice, on "Automatic Refill". Patient may have prescription filled one day early if pharmacy is  closed on scheduled refill date. Do not fill until: :10/04/2017 To last until:11/03/2017    Order Specific Question:   Supervising Provider    Answer:   Milinda Pointer [phone]   New Prescriptions   No medications on file   Medications administered today: [name] had no medications administered during this visit. Lab-work, procedure(s), and/or referral(s): No orders of the defined types were placed in this encounter.  Imaging and/or referral(s): None  Interventional therapies: Planned, scheduled, and/or pending:   Not at this time.  Provider-requested follow-up: Return in about 2 months (around 11/23/2017) for MedMgmt with Me Donella Stade Edison Pace).  Future Appointments  Date Time Provider [place]  11/21/2017  1:30 PM Vevelyn Francois, NP [facility] None  12/06/2017 11:00 AM [name], MD [facility]  06/05/2018  1:45 PM Hayden Pedro, MD [facility] None   Primary Care Physician: [name], MD Location: [facility] Note by: Vevelyn Francois NP Date: 09/23/2017; Time: 3:09 PM  Pain Score Disclaimer: We use the NRS-11 scale. This is a self-reported, subjective measurement of pain severity with only modest accuracy. It is used primarily to identify changes within a particular patient. It must be understood that outpatient pain scales are significantly less accurate that those used for  research, where they can be applied under ideal controlled circumstances with minimal exposure to variables. In reality, the score is likely to be a combination of pain intensity and pain affect, where pain affect describes the degree of emotional arousal or changes in action readiness caused by the sensory experience of pain. Factors such as social and work situation, setting, emotional state, anxiety levels, expectation, and prior pain experience may influence pain perception and show large inter-individual differences that may also be affected by time variables.  Patient instructions provided during this appointment: Patient Instructions   ____________________________________________________________________________________________  Medication Rules  Applies to: All patients receiving prescriptions (written or electronic).  Pharmacy of record: Pharmacy where electronic prescriptions will be sent. If written prescriptions are taken to a different pharmacy, please inform the nursing staff. The pharmacy listed in the electronic medical record should be the one where you would like electronic prescriptions to be sent.  Prescription refills: Only during scheduled appointments. Applies to both, written and electronic prescriptions.  NOTE: The following applies primarily to controlled substances (Opioid* Pain Medications).   Patient's responsibilities: 1. Pain Pills: Bring all pain pills to every appointment (except for procedure appointments). 2. Pill Bottles: Bring pills in original pharmacy bottle. Always bring newest bottle. Bring bottle, even if empty. 3. Medication refills: You are responsible for knowing and keeping track of what medications you need refilled. The day before your appointment, write a list of all prescriptions that need to be refilled. Bring that list to your appointment and give it to the admitting nurse. Prescriptions will be written only during appointments. If you forget a  medication, it will not be "Called in", "Faxed", or "electronically sent". You will need to get another appointment to get these prescribed. 4. Prescription Accuracy: You are responsible for carefully inspecting your prescriptions before leaving our office. Have the discharge nurse carefully go over each prescription with you, before taking them home. Make sure that your name is accurately spelled, that your address is correct. Check the name and dose of your medication to make sure it is accurate. Check the number of pills, and the written instructions to make sure they are clear and accurate. Make sure that you are given  enough medication to last until your next medication refill appointment. 5. Taking Medication: Take medication as prescribed. Never take more pills than instructed. Never take medication more frequently than prescribed. Taking less pills or less frequently is permitted and encouraged, when it comes to controlled substances (written prescriptions).  6. Inform other Doctors: Always inform, all of your healthcare providers, of all the medications you take. 7. Pain Medication from other Providers: You are not allowed to accept any additional pain medication from any other Doctor or Healthcare provider. There are two exceptions to this rule. (see below) In the event that you require additional pain medication, you are responsible for notifying us, as stated below. 8. Medication Agreement: You are responsible for carefully reading and following our Medication Agreement. This must be signed before receiving any prescriptions from our practice. Safely store a copy of your signed Agreement. Violations to the Agreement will result in no further prescriptions. (Additional copies of our Medication Agreement are available upon request.) 9. Laws, Rules, & Regulations: All patients are expected to follow all Federal and [employer], [employer], Rules, [employer]. Ignorance of the Laws does not constitute a  valid excuse. The use of any illegal substances is prohibited. 10. Adopted CDC guidelines & recommendations: Target dosing levels will be at or below 60 MME/day. Use of benzodiazepines** is not recommended.  Exceptions: There are only two exceptions to the rule of not receiving pain medications from other Healthcare Providers. 1. Exception #1 (Emergencies): In the event of an emergency (i.e.: accident requiring emergency care), you are allowed to receive additional pain medication. However, you are responsible for: As soon as you are able, call our office [phone], at any time of the day or night, and leave a message stating your name, the date and nature of the emergency, and the name and dose of the medication prescribed. In the event that your call is answered by a member of our staff, make sure to document and save the date, time, and the name of the person that took your information.  2. Exception #2 (Planned Surgery): In the event that you are scheduled by another doctor or dentist to have any type of surgery or procedure, you are allowed (for a period no longer than 30 days), to receive additional pain medication, for the acute post-op pain. However, in this case, you are responsible for picking up a copy of our "Post-op Pain Management for Surgeons" handout, and giving it to your surgeon or dentist. This document is available at our office, and does not require an appointment to obtain it. Simply go to our office during business hours (Monday-Thursday from 8:00 AM to 4:00 PM) (Friday 8:00 AM to 12:00 Noon) or if you have a scheduled appointment with [place], prior to your surgery, and ask for it by name. In addition, you will need to provide [place] with your name, name of your surgeon, type of surgery, and date of procedure or surgery.  *Opioid medications include: morphine, codeine, oxycodone, oxymorphone, hydrocodone, hydromorphone, meperidine, tramadol, tapentadol, buprenorphine, fentanyl,  methadone. **Benzodiazepine medications include: diazepam (Valium), alprazolam (Xanax), clonazepam (Klonopine), lorazepam (Ativan), clorazepate (Tranxene), chlordiazepoxide (Librium), estazolam (Prosom), oxazepam (Serax), temazepam (Restoril), triazolam (Halcion) (Last updated: 04/18/2017) ____________________________________________________________________________________________    BMI Assessment: Estimated body mass index is 40.06 kg/m as calculated from the following:   Height as of this encounter: 5' 1"  (1.549 m).   Weight as of this encounter: 212 lb (96.2 kg).  BMI interpretation table: BMI level Category Range association with  higher incidence of chronic pain  <18 kg/m2 Underweight   18.5-24.9 kg/m2 Ideal body weight   25-29.9 kg/m2 Overweight Increased incidence by 20%  30-34.9 kg/m2 Obese (Class I) Increased incidence by 68%  35-39.9 kg/m2 Severe obesity (Class II) Increased incidence by 136%  >40 kg/m2 Extreme obesity (Class III) Increased incidence by 254%   Patient's current BMI Ideal Body weight  Body mass index is 40.06 kg/m. Ideal body weight: 47.8 kg (105 lb 6.1 oz) Adjusted ideal body weight: 67.1 kg (148 lb 0.5 oz)   BMI Readings from Last 4 Encounters:  09/23/17 40.06 kg/m  08/05/17 40.17 kg/m  07/25/17 40.25 kg/m  05/27/17 40.25 kg/m   Wt Readings from Last 4 Encounters:  09/23/17 212 lb (96.2 kg)  08/05/17 212 lb 9.6 oz (96.4 kg)  07/25/17 213 lb (96.6 kg)  05/27/17 213 lb (96.6 kg)

## 2017-09-23 NOTE — Progress Notes (Signed)
Nursing Pain Medication Assessment:  Safety precautions to be maintained throughout the outpatient stay will include: orient to surroundings, keep bed in low position, maintain call bell within reach at all times, provide assistance with transfer out of bed and ambulation.  Medication Inspection Compliance: Pill count conducted under aseptic conditions, in front of the patient. Neither the pills nor the bottle was removed from the patient's sight at any time. Once count was completed pills were immediately returned to the patient in their original bottle.  Medication: Oxycodone/APAP Pill/Patch Count: 42 of 90 pills remain Pill/Patch Appearance: Markings consistent with prescribed medication Bottle Appearance: Standard pharmacy container. Clearly labeled. Filled Date: 7 / 17 / 2019 Last Medication intake:  Today

## 2017-09-26 DIAGNOSIS — M810 Age-related osteoporosis without current pathological fracture: Secondary | ICD-10-CM | POA: Insufficient documentation

## 2017-10-23 ENCOUNTER — Other Ambulatory Visit: Payer: Self-pay | Admitting: Family Medicine

## 2017-10-23 DIAGNOSIS — M810 Age-related osteoporosis without current pathological fracture: Secondary | ICD-10-CM

## 2017-11-19 ENCOUNTER — Ambulatory Visit: Payer: Medicare Other | Attending: Nurse Practitioner | Admitting: Nurse Practitioner

## 2017-11-19 ENCOUNTER — Other Ambulatory Visit: Payer: Self-pay

## 2017-11-19 ENCOUNTER — Encounter: Payer: Self-pay | Admitting: Nurse Practitioner

## 2017-11-19 VITALS — BP 141/71 | HR 75 | Temp 98.0°F | Resp 16 | Ht 61.5 in | Wt 212.0 lb

## 2017-11-19 DIAGNOSIS — E1122 Type 2 diabetes mellitus with diabetic chronic kidney disease: Secondary | ICD-10-CM | POA: Diagnosis not present

## 2017-11-19 DIAGNOSIS — M79642 Pain in left hand: Secondary | ICD-10-CM | POA: Diagnosis not present

## 2017-11-19 DIAGNOSIS — K429 Umbilical hernia without obstruction or gangrene: Secondary | ICD-10-CM | POA: Diagnosis not present

## 2017-11-19 DIAGNOSIS — M159 Polyosteoarthritis, unspecified: Secondary | ICD-10-CM | POA: Insufficient documentation

## 2017-11-19 DIAGNOSIS — I129 Hypertensive chronic kidney disease with stage 1 through stage 4 chronic kidney disease, or unspecified chronic kidney disease: Secondary | ICD-10-CM | POA: Insufficient documentation

## 2017-11-19 DIAGNOSIS — Z79891 Long term (current) use of opiate analgesic: Secondary | ICD-10-CM | POA: Insufficient documentation

## 2017-11-19 DIAGNOSIS — M25562 Pain in left knee: Secondary | ICD-10-CM | POA: Diagnosis present

## 2017-11-19 DIAGNOSIS — M545 Low back pain: Secondary | ICD-10-CM | POA: Diagnosis not present

## 2017-11-19 DIAGNOSIS — G894 Chronic pain syndrome: Secondary | ICD-10-CM | POA: Diagnosis not present

## 2017-11-19 DIAGNOSIS — G8929 Other chronic pain: Secondary | ICD-10-CM | POA: Diagnosis present

## 2017-11-19 DIAGNOSIS — M79641 Pain in right hand: Secondary | ICD-10-CM | POA: Insufficient documentation

## 2017-11-19 DIAGNOSIS — M810 Age-related osteoporosis without current pathological fracture: Secondary | ICD-10-CM | POA: Insufficient documentation

## 2017-11-19 DIAGNOSIS — E785 Hyperlipidemia, unspecified: Secondary | ICD-10-CM | POA: Diagnosis not present

## 2017-11-19 DIAGNOSIS — F1911 Other psychoactive substance abuse, in remission: Secondary | ICD-10-CM | POA: Diagnosis not present

## 2017-11-19 DIAGNOSIS — M25561 Pain in right knee: Secondary | ICD-10-CM | POA: Diagnosis not present

## 2017-11-19 DIAGNOSIS — N183 Chronic kidney disease, stage 3 (moderate): Secondary | ICD-10-CM | POA: Insufficient documentation

## 2017-11-19 DIAGNOSIS — D509 Iron deficiency anemia, unspecified: Secondary | ICD-10-CM | POA: Diagnosis not present

## 2017-11-19 MED ORDER — OXYCODONE-ACETAMINOPHEN 7.5-325 MG PO TABS: 1.0000 | ORAL_TABLET | Freq: Three times a day (TID) | ORAL | 0 refills | Status: DC | PRN

## 2017-11-19 NOTE — Progress Notes (Signed)
Patient's Name: Taylor [name]  MRN: [id number]  Referring Provider: [name], MD  DOB: 01-16-1933  PCP: [name], MD  DOS: 11/19/2017  Note by: Vevelyn Francois NP  Service setting: Ambulatory outpatient  Specialty: Interventional Pain Management  Location: [facility] (AMB) Pain Management Facility    Patient type: Established    Primary Reason(s) for Visit: Encounter for prescription drug management. (Level of risk: moderate)  CC: Knee Pain (bilateral) and Hand Pain (bilateral)  HPI  Ms. Gonterman is a 84 y.o. year old, female patient, who comes today for a medication management evaluation. She has Lump or mass in breast; Umbilical hernia; Osteoarthritis, multiple sites; Type 2 diabetes mellitus with microalbuminuria ([place]); Glaucoma; BP (high blood pressure); Chronic pain; Tinea corporis; Calculus of kidney; Vaginal dryness; Dyslipidemia associated with type 2 diabetes mellitus ([place]); Chronic midline low back pain without sciatica; Chronic pain of right knee; Long term current use of opiate analgesic; Primary open angle glaucoma (POAG) of both eyes, severe stage; Hypertensive retinopathy; Vitamin D deficiency; Anemia, unspecified; Chronic pain syndrome; Aortic atherosclerosis ([place]); Anterolisthesis; Iron deficiency anemia; Assistance needed with transportation; Osteoporotic compression fracture of spine ([place]); HLD (hyperlipidemia); Hyperparathyroidism ([place]); Chronic kidney disease, stage III (moderate) ([place]); Pain in both hands; and Age-related osteoporosis without current pathological fracture on their problem list. Her primarily concern today is the Knee Pain (bilateral) and Hand Pain (bilateral)  Pain Assessment: Location: Right, Left Knee Radiating: bilateral, right is worse Onset: More than a month ago Duration: Chronic pain Quality: Aching, Discomfort, Sharp, Pins and needles Severity: 7 /10 (subjective, self-reported pain score)  Note: Reported level is compatible with observation.  Clinically the patient looks like a 2/10 A 2/10 is viewed as "Mild to Moderate" and described as noticeable and distracting. Impossible to hide from other people. More frequent flare-ups. Still possible to adapt and function close to normal. It can be very annoying and may have occasional stronger flare-ups. With discipline, patients may get used to it and adapt. Information on the proper use of the pain scale provided to the patient today. When using our objective Pain Scale, levels between 6 and 10/10 are said to belong in an emergency room, as it progressively worsens from a 6/10, described as severely limiting, requiring emergency care not usually available at an outpatient pain management facility. At a 6/10 level, communication becomes difficult and requires great effort. Assistance to reach the emergency department may be required. Facial flushing and profuse sweating along with potentially dangerous increases in heart rate and blood pressure will be evident. Effect on ADL: prolonged standing or walking, bending, twisting Timing: Intermittent Modifying factors: ice, heat, medication, cream BP: (!) 141/71  HR: 75  Ms. Voytko was last scheduled for an appointment on 09/23/2017 for medication management. During today's appointment we reviewed Ms. Brenton's chronic pain status, as well as her outpatient medication regimen.  The patient  reports that she does not use drugs. Her body mass index is 39.41 kg/m.  Further details on both, my assessment(s), as well as the proposed treatment plan, please see below.  Controlled Substance Pharmacotherapy Assessment REMS (Risk Evaluation and Mitigation Strategy)  Analgesic:Oxycodone/acetaminophen 7.5/325 mg 3times daily MME/day:33.28m/day.  GIgnatius Specking RN  11/19/2017  1:57 PM  Sign at close encounter Nursing Pain Medication Assessment:  Safety precautions to be maintained throughout the outpatient stay will include: orient to surroundings, keep  bed in low position, maintain call bell within reach at all times, provide assistance with transfer out of bed and ambulation.  Medication Inspection Compliance: Pill count conducted under aseptic conditions, in front of the patient. Neither the pills nor the bottle was removed from the patient's sight at any time. Once count was completed pills were immediately returned to the patient in their original bottle.  Medication: Oxycodone/APAP Pill/Patch Count: 45 of 66 pills remain Pill/Patch Appearance: Markings consistent with prescribed medication Bottle Appearance: Standard pharmacy container. Clearly labeled. Filled Date: 17 / 16 / 2019 Last Medication intake:  Today   Pharmacokinetics: Liberation and absorption (onset of action): WNL Distribution (time to peak effect): WNL Metabolism and excretion (duration of action): WNL         Pharmacodynamics: Desired effects: Analgesia: Ms. Ikner reports >50% benefit. Functional ability: Patient reports that medication allows her to accomplish basic ADLs Clinically meaningful improvement in function (CMIF): Sustained CMIF goals met Perceived effectiveness: Described as relatively effective, allowing for increase in activities of daily living (ADL) Undesirable effects: Side-effects or Adverse reactions: None reported Monitoring: [place] PMP: Online review of the past 64-monthperiod conducted. Compliant with practice rules and regulations Last UDS on record: Summary  Date Value Ref Range Status  05/27/2017 FINAL  Final    Comment:    ==================================================================== TOXASSURE SELECT 13 (MW) ==================================================================== Test                             Result       Flag       Units Drug Present and Declared for Prescription Verification   Oxycodone                      494          EXPECTED   ng/mg creat   Oxymorphone                    164          EXPECTED   ng/mg  creat   Noroxycodone                   540          EXPECTED   ng/mg creat    Sources of oxycodone include scheduled prescription medications.    Oxymorphone and noroxycodone are expected metabolites of    oxycodone. Oxymorphone is also available as a scheduled    prescription medication. ==================================================================== Test                      Result    Flag   Units      Ref Range   Creatinine              87               mg/dL      >=20 ==================================================================== Declared Medications:  The flagging and interpretation on this report are based on the  following declared medications.  Unexpected results may arise from  inaccuracies in the declared medications.  **Note: The testing scope of this panel includes these medications:  Oxycodone (Percocet)  **Note: The testing scope of this panel does not include following  reported medications:  Acetaminophen  Acetaminophen (Percocet)  Alendronate (Fosamax)  Amlodipine (Norvasc)  Besifloxacin (Besivance)  Bimatoprost (Lumigan)  Brimonidine Tartrate  Cholecalciferol  Estrogen (Premarin)  Fluticasone (Flonase)  Ketoconazole (Nizoral)  Metformin (Glucophage)  Olmesartan (Benicar)  Omega-3 Fatty Acids (Lovaza)  Pravastatin (Pravachol)  Ranitidine (Zantac)  Timolol  Vitamin D2 (  Drisdol) ==================================================================== For clinical consultation, please call [phone]. ====================================================================    UDS interpretation: Compliant          Medication Assessment Form: Reviewed. Patient indicates being compliant with therapy Treatment compliance: Compliant Risk Assessment Profile: Aberrant behavior: See prior evaluations. None observed or detected today Comorbid factors increasing risk of overdose: See prior notes. No additional risks detected today Opioid risk tool (ORT)  (Total Score): 0 Personal History of Substance Abuse (SUD-Substance use disorder):  Alcohol: Negative  Illegal Drugs: Negative  Rx Drugs: Negative  ORT Risk Level calculation: Low Risk Risk of substance use disorder (SUD): Low Opioid Risk Tool - 11/19/17 1354      Family History of Substance Abuse   Alcohol  Negative    Illegal Drugs  Negative    Rx Drugs  Negative      Personal History of Substance Abuse   Alcohol  Negative    Illegal Drugs  Negative    Rx Drugs  Negative      Age   Age between 63-45 years   No      History of Preadolescent Sexual Abuse   History of Preadolescent Sexual Abuse  Negative or Female      Psychological Disease   Psychological Disease  Negative    Depression  Negative      Total Score   Opioid Risk Tool Scoring  0    Opioid Risk Interpretation  Low Risk      ORT Scoring interpretation table:  Score <3 = Low Risk for SUD  Score between 4-7 = Moderate Risk for SUD  Score >8 = High Risk for Opioid Abuse   Risk Mitigation Strategies:  Patient Counseling: Covered Patient-Prescriber Agreement (PPA): Present and active  Notification to other healthcare providers: Done  Pharmacologic Plan: No change in therapy, at this time.             Laboratory Chemistry  Inflammation Markers (CRP: Acute Phase) (ESR: Chronic Phase) No results found for: CRP, ESRSEDRATE, LATICACIDVEN                       Rheumatology Markers No results found for: RF, ANA, LABURIC, URICUR, LYMEIGGIGMAB, LYMEABIGMQN, HLAB27                      Renal Function Markers Lab Results  Component Value Date   BUN 22 08/05/2017   CREATININE 1.31 (H) 08/05/2017   BCR 17 08/05/2017   GFRAA 44 (L) 08/05/2017   GFRNONAA 38 (L) 08/05/2017                             Hepatic Function Markers Lab Results  Component Value Date   AST 8 (L) 08/05/2017   ALT 6 08/05/2017   ALBUMIN 4.0 08/14/2016   ALKPHOS 56 08/14/2016                        Electrolytes Lab Results   Component Value Date   NA 142 08/05/2017   K 4.5 08/05/2017   CL 106 08/05/2017   CALCIUM 9.7 08/05/2017                        Neuropathy Markers Lab Results  Component Value Date   HGBA1C 7.8 (A) 08/05/2017  CNS Tests No results found for: COLORCSF, APPEARCSF, RBCCOUNTCSF, WBCCSF, POLYSCSF, LYMPHSCSF, EOSCSF, PROTEINCSF, GLUCCSF, JCVIRUS, CSFOLI, IGGCSF                      Bone Pathology Markers Lab Results  Component Value Date   VD25OH 29 (L) 08/05/2017                         Coagulation Parameters Lab Results  Component Value Date   PLT 328 08/05/2017                        Cardiovascular Markers Lab Results  Component Value Date   HGB 11.2 (L) 08/05/2017   HCT 34.6 (L) 08/05/2017                         CA Markers No results found for: CEA, CA125, LABCA2                      Note: Lab results reviewed.  Recent Diagnostic Imaging Results  MM 3D SCREEN BREAST BILATERAL CLINICAL DATA:  Screening.  EXAM: DIGITAL SCREENING BILATERAL MAMMOGRAM WITH TOMO AND CAD  COMPARISON:  Previous exam(s).  ACR Breast Density Category b: There are scattered areas of fibroglandular density.  FINDINGS: There are no findings suspicious for malignancy. Images were processed with CAD.  IMPRESSION: No mammographic evidence of malignancy. A result letter of this screening mammogram will be mailed directly to the patient.  RECOMMENDATION: Screening mammogram in one year. (Code:[id number])  BI-RADS CATEGORY  1: Negative.  Electronically Signed   By: Lillia Mountain M.D.   On: 08/23/2017 12:26  Complexity Note: Imaging results reviewed. Results shared with Ms. Mcgibbon, using [employer].                         Meds   Current Outpatient Medications:  .  acetaminophen (TYLENOL) 500 MG tablet, Take 1 tablet (500 mg total) by mouth 3 (three) times daily. (Patient taking differently: Take 500 mg by mouth as needed. ), Disp: 90 tablet, Rfl: 0 .   alendronate (FOSAMAX) 70 MG tablet, TAKE 1 TABLET WEEKLY, Disp: 12 tablet, Rfl: 1 .  amLODipine (NORVASC) 10 MG tablet, Take 1 tablet (10 mg total) by mouth daily., Disp: 90 tablet, Rfl: 0 .  bimatoprost (LUMIGAN) 0.01 % SOLN, Place 1 drop into both eyes daily. , Disp: , Rfl:  .  brimonidine-timolol (COMBIGAN) 0.2-0.5 % ophthalmic solution, Place 1 drop into both eyes See admin instructions. 1 drop into both eyes in the morning, then 1 drop into both eyes 8 hours later, Disp: , Rfl:  .  Bromfenac Sodium 0.09 % SOLN, APPLY 1 DROP INTO AFFECTED EYE DAILY, Disp: , Rfl:  .  Cholecalciferol (VITAMIN D-1000 MAX ST) 1000 UNITS tablet, Take 1,000 Units by mouth daily. Reported on 08/05/2015, Disp: , Rfl:  .  conjugated estrogens (PREMARIN) vaginal cream, Place 1 Applicatorful vaginally daily., Disp: 42.5 g, Rfl: 5 .  fluticasone (FLONASE) 50 MCG/ACT nasal spray, SPRAY 2 SPRAYS INTO EACH NOSTRIL EVERY DAY, Disp: 16 g, Rfl: 1 .  glucose blood test strip, Use as instructed, Disp: 100 each, Rfl: 12 .  metFORMIN (GLUCOPHAGE-XR) 750 MG 24 hr tablet, Take 2 tablets (1,500 mg total) by mouth daily with breakfast., Disp: 180 tablet, Rfl: 1 .  olmesartan (BENICAR) 20 MG tablet, Take  1 tablet (20 mg total) by mouth daily., Disp: 90 tablet, Rfl: 1 .  omega-3 acid ethyl esters (LOVAZA) 1 g capsule, TAKE 1 CAPSULE (1 G TOTAL) BY MOUTH 2 (TWO) TIMES DAILY., Disp: 180 capsule, Rfl: 1 .  ONETOUCH DELICA LANCETS FINE MISC, 1 each by Does not apply route daily. 30 G Lancets- One Touch Delica, Disp: 858 each, Rfl: 1 .  [START ON 01/02/2018] oxyCODONE-acetaminophen (PERCOCET) 7.5-325 MG tablet, Take 1 tablet by mouth every 8 (eight) hours as needed for moderate pain or severe pain., Disp: 90 tablet, Rfl: 0 .  pravastatin (PRAVACHOL) 40 MG tablet, Take 1 tablet (40 mg total) by mouth at bedtime., Disp: 90 tablet, Rfl: 1 .  ranitidine (ZANTAC) 150 MG tablet, TAKE 1 TABLET BY MOUTH DAILY AS NEEDED FOR HEARTBURN,, Disp: 90 tablet,  Rfl: 0 .  Vitamin D, Ergocalciferol, (DRISDOL) 50000 units CAPS capsule, TAKE 1 CAPSULE (50,000 UNITS TOTAL) BY MOUTH EVERY 7 (SEVEN) DAYS., Disp: 12 capsule, Rfl: 0 .  [START ON 12/03/2017] oxyCODONE-acetaminophen (PERCOCET) 7.5-325 MG tablet, Take 1 tablet by mouth every 8 (eight) hours as needed for moderate pain or severe pain., Disp: 90 tablet, Rfl: 0  ROS  Constitutional: Denies any fever or chills Gastrointestinal: No reported hemesis, hematochezia, vomiting, or acute GI distress Musculoskeletal: Denies any acute onset joint swelling, redness, loss of ROM, or weakness Neurological: No reported episodes of acute onset apraxia, aphasia, dysarthria, agnosia, amnesia, paralysis, loss of coordination, or loss of consciousness  Allergies  Ms. Penado is allergic to ace inhibitors.  [place]  Drug: Ms. Trovato  reports that she does not use drugs. Alcohol:  reports that she does not drink alcohol. Tobacco:  reports that she has never smoked. She has never used smokeless tobacco. Medical:  has a past medical history of Abnormal mammogram, unspecified (2013), Breast screening, unspecified (2013), Diabetes mellitus without complication ([place]) (8502), GERD (gastroesophageal reflux disease), Glaucoma (2003), Gout, Hyperlipidemia (2008), Hypertension (1980's), Lump or mass in breast (01/03/2012), Obesity, unspecified (2013), Osteoporosis, Shingles (2013), and Special screening for malignant neoplasms, colon (2013). Surgical: Ms. Libbey  has a past surgical history that includes Abdominal hysterectomy (1958); Knee surgery (2009,2011,2012); Lipoma excision (1998); Spine surgery (2004); Tonsillectomy; Colonoscopy (2003); Coronary artery bypass graft (Left, 01/03/2012,01/24/2012); Cataract extraction, bilateral (Bilateral); Cholecystectomy; Back surgery; Knee surgery; and Breast biopsy (Left, 01/25/2012). Family: family history includes Cancer in her mother; Heart disease in her father; Stroke in her  mother.  Constitutional Exam  General appearance: Well nourished, well developed, and well hydrated. In no apparent acute distress Vitals:   11/19/17 1343  BP: (!) 141/71  Pulse: 75  Resp: 16  Temp: 98 F (36.7 C)  SpO2: 97%  Weight: 212 lb (96.2 kg)  Height: 5' 1.5" (1.562 m)   Psych/Mental status: Alert, oriented x 3 (person, place, & time)       Eyes: PERLA Respiratory: No evidence of acute respiratory distress  Cervical Spine Area Exam  Skin & Axial Inspection: No masses, redness, edema, swelling, or associated skin lesions Alignment: Symmetrical Functional ROM: Unrestricted ROM      Stability: No instability detected Muscle Tone/Strength: Functionally intact. No obvious neuro-muscular anomalies detected. Sensory (Neurological): Unimpaired Palpation: No palpable anomalies              Upper Extremity (UE) Exam    Side: Right upper extremity  Side: Left upper extremity  Skin & Extremity Inspection: Skin color, temperature, and hair growth are WNL. No peripheral edema or cyanosis. No masses, redness, swelling,  asymmetry, or associated skin lesions. No contractures.  Skin & Extremity Inspection: Skin color, temperature, and hair growth are WNL. No peripheral edema or cyanosis. No masses, redness, swelling, asymmetry, or associated skin lesions. No contractures.  Functional ROM: Unrestricted ROM          Functional ROM: Unrestricted ROM          Muscle Tone/Strength: Functionally intact. No obvious neuro-muscular anomalies detected.  Muscle Tone/Strength: Functionally intact. No obvious neuro-muscular anomalies detected.  Sensory (Neurological): Unimpaired          Sensory (Neurological): Unimpaired          Palpation: No palpable anomalies              Palpation: No palpable anomalies                   Thoracic Spine Area Exam  Skin & Axial Inspection: No masses, redness, or swelling Alignment: Symmetrical Functional ROM: Unrestricted ROM Stability: No instability  detected Muscle Tone/Strength: Functionally intact. No obvious neuro-muscular anomalies detected. Sensory (Neurological): Unimpaired Muscle strength & Tone: No palpable anomalies  Lumbar Spine Area Exam  Skin & Axial Inspection: No masses, redness, or swelling Alignment: Symmetrical Functional ROM: Unrestricted ROM       Stability: No instability detected Muscle Tone/Strength: Functionally intact. No obvious neuro-muscular anomalies detected. Sensory (Neurological): Unimpaired Palpation: No palpable anomalies       Provocative Tests: Hyperextension/rotation test: deferred today       Lumbar quadrant test (Kemp's test): deferred today       Lateral bending test: deferred today       Patrick's Maneuver: deferred today                   FABER test: deferred today                   S-I anterior distraction/compression test: deferred today         S-I lateral compression test: deferred today         S-I Thigh-thrust test: deferred today         S-I Gaenslen's test: deferred today          Gait & Posture Assessment  Ambulation: Patient ambulates using a walker Gait: Relatively normal for age and body habitus Posture: WNL   Lower Extremity Exam    Side: Right lower extremity  Side: Left lower extremity  Stability: No instability observed          Stability: No instability observed          Skin & Extremity Inspection: Skin color, temperature, and hair growth are WNL. No peripheral edema or cyanosis. No masses, redness, swelling, asymmetry, or associated skin lesions. No contractures.  Skin & Extremity Inspection: Skin color, temperature, and hair growth are WNL. No peripheral edema or cyanosis. No masses, redness, swelling, asymmetry, or associated skin lesions. No contractures.  Functional ROM: Unrestricted ROM                  Functional ROM: Unrestricted ROM                  Muscle Tone/Strength: Functionally intact. No obvious neuro-muscular anomalies detected.  Muscle Tone/Strength:  Functionally intact. No obvious neuro-muscular anomalies detected.  Sensory (Neurological): Unimpaired  Sensory (Neurological): Unimpaired  Palpation: No palpable anomalies  Palpation: No palpable anomalies   Assessment  Primary Diagnosis & Pertinent Problem List: The primary encounter diagnosis was Chronic midline low back  pain without sciatica. Diagnoses of Osteoarthritis of multiple joints, unspecified osteoarthritis type, Chronic pain of right knee, and Chronic pain syndrome were also pertinent to this visit.  Status Diagnosis  Persistent Persistent Persistent 1. Chronic midline low back pain without sciatica   2. Osteoarthritis of multiple joints, unspecified osteoarthritis type   3. Chronic pain of right knee   4. Chronic pain syndrome     Problems updated and reviewed during this visit: Problem  Age-Related Osteoporosis Without Current Pathological Fracture   Plan of Care  Pharmacotherapy (Medications Ordered): Meds ordered this encounter  Medications  . oxyCODONE-acetaminophen (PERCOCET) 7.5-325 MG tablet    Sig: Take 1 tablet by mouth every 8 (eight) hours as needed for moderate pain or severe pain.    Dispense:  90 tablet    Refill:  0    Do not place this medication, or any other prescription from our practice, on "Automatic Refill". Patient may have prescription filled one day early if pharmacy is closed on scheduled refill date.    Order Specific Question:   Supervising Provider    Answer:   Milinda Pointer [phone]  . oxyCODONE-acetaminophen (PERCOCET) 7.5-325 MG tablet    Sig: Take 1 tablet by mouth every 8 (eight) hours as needed for moderate pain or severe pain.    Dispense:  90 tablet    Refill:  0    Do not place this medication, or any other prescription from our practice, on "Automatic Refill". Patient may have prescription filled one day early if pharmacy is closed on scheduled refill date.    Order Specific Question:   Supervising Provider    Answer:    Milinda Pointer [[id number]]   New Prescriptions   No medications on file   Medications administered today: [name] had no medications administered during this visit. Lab-work, procedure(s), and/or referral(s): No orders of the defined types were placed in this encounter.  Imaging and/or referral(s): None  Interventional therapies: Planned, scheduled, and/or pending:   Not at this time.  Provider-requested follow-up: Return in about 2 months (around 01/19/2018) for MedMgmt.  Future Appointments  Date Time Provider [place]  12/06/2017 11:00 AM [name], MD [facility]  01/21/2018  1:45 PM Vevelyn Francois, NP [facility] None  06/05/2018  1:45 PM Hayden Pedro, MD [facility] None   Primary Care Physician: [name], MD Location: [facility] Note by: Vevelyn Francois NP Date: 11/19/2017; Time: 2:48 PM  Pain Score Disclaimer: We use the NRS-11 scale. This is a self-reported, subjective measurement of pain severity with only modest accuracy. It is used primarily to identify changes within a particular patient. It must be understood that outpatient pain scales are significantly less accurate that those used for research, where they can be applied under ideal controlled circumstances with minimal exposure to variables. In reality, the score is likely to be a combination of pain intensity and pain affect, where pain affect describes the degree of emotional arousal or changes in action readiness caused by the sensory experience of pain. Factors such as social and work situation, setting, emotional state, anxiety levels, expectation, and prior pain experience may influence pain perception and show large inter-individual differences that may also be affected by time variables.  Patient instructions provided during this appointment: Patient Instructions    ____________________________________________________________________________________________  Medication Rules  Applies to: All patients receiving prescriptions (written or electronic).  Pharmacy of record: Pharmacy where electronic prescriptions will be sent. If written prescriptions are  taken to a different pharmacy, please inform the nursing staff. The pharmacy listed in the electronic medical record should be the one where you would like electronic prescriptions to be sent.  Prescription refills: Only during scheduled appointments. Applies to both, written and electronic prescriptions.  NOTE: The following applies primarily to controlled substances (Opioid* Pain Medications).   Patient's responsibilities: 1. Pain Pills: Bring all pain pills to every appointment (except for procedure appointments). 2. Pill Bottles: Bring pills in original pharmacy bottle. Always bring newest bottle. Bring bottle, even if empty. 3. Medication refills: You are responsible for knowing and keeping track of what medications you need refilled. The day before your appointment, write a list of all prescriptions that need to be refilled. Bring that list to your appointment and give it to the admitting nurse. Prescriptions will be written only during appointments. If you forget a medication, it will not be "Called in", "Faxed", or "electronically sent". You will need to get another appointment to get these prescribed. 4. Prescription Accuracy: You are responsible for carefully inspecting your prescriptions before leaving our office. Have the discharge nurse carefully go over each prescription with you, before taking them home. Make sure that your name is accurately spelled, that your address is correct. Check the name and dose of your medication to make sure it is accurate. Check the number of pills, and the written instructions to make sure they are clear and accurate. Make sure that you are given enough medication to  last until your next medication refill appointment. 5. Taking Medication: Take medication as prescribed. Never take more pills than instructed. Never take medication more frequently than prescribed. Taking less pills or less frequently is permitted and encouraged, when it comes to controlled substances (written prescriptions).  6. Inform other Doctors: Always inform, all of your healthcare providers, of all the medications you take. 7. Pain Medication from other Providers: You are not allowed to accept any additional pain medication from any other Doctor or Healthcare provider. There are two exceptions to this rule. (see below) In the event that you require additional pain medication, you are responsible for notifying us, as stated below. 8. Medication Agreement: You are responsible for carefully reading and following our Medication Agreement. This must be signed before receiving any prescriptions from our practice. Safely store a copy of your signed Agreement. Violations to the Agreement will result in no further prescriptions. (Additional copies of our Medication Agreement are available upon request.) 9. Laws, Rules, & Regulations: All patients are expected to follow all Federal and [employer], [employer], Rules, [employer]. Ignorance of the Laws does not constitute a valid excuse. The use of any illegal substances is prohibited. 10. Adopted CDC guidelines & recommendations: Target dosing levels will be at or below 60 MME/day. Use of benzodiazepines** is not recommended.  Exceptions: There are only two exceptions to the rule of not receiving pain medications from other Healthcare Providers. 1. Exception #1 (Emergencies): In the event of an emergency (i.e.: accident requiring emergency care), you are allowed to receive additional pain medication. However, you are responsible for: As soon as you are able, call our office [phone], at any time of the day or night, and leave a message stating your  name, the date and nature of the emergency, and the name and dose of the medication prescribed. In the event that your call is answered by a member of our staff, make sure to document and save the date, time, and the name of the  person that took your information.  2. Exception #2 (Planned Surgery): In the event that you are scheduled by another doctor or dentist to have any type of surgery or procedure, you are allowed (for a period no longer than 30 days), to receive additional pain medication, for the acute post-op pain. However, in this case, you are responsible for picking up a copy of our "Post-op Pain Management for Surgeons" handout, and giving it to your surgeon or dentist. This document is available at our office, and does not require an appointment to obtain it. Simply go to our office during business hours (Monday-Thursday from 8:00 AM to 4:00 PM) (Friday 8:00 AM to 12:00 Noon) or if you have a scheduled appointment with [place], prior to your surgery, and ask for it by name. In addition, you will need to provide [place] with your name, name of your surgeon, type of surgery, and date of procedure or surgery.  *Opioid medications include: morphine, codeine, oxycodone, oxymorphone, hydrocodone, hydromorphone, meperidine, tramadol, tapentadol, buprenorphine, fentanyl, methadone. **Benzodiazepine medications include: diazepam (Valium), alprazolam (Xanax), clonazepam (Klonopine), lorazepam (Ativan), clorazepate (Tranxene), chlordiazepoxide (Librium), estazolam (Prosom), oxazepam (Serax), temazepam (Restoril), triazolam (Halcion) (Last updated: 04/18/2017) ____________________________________________________________________________________________ BMI Assessment: Estimated body mass index is 40.06 kg/m as calculated from the following:   Height as of 09/23/17: 5' 1"  (1.549 m).   Weight as of 09/23/17: 212 lb (96.2 kg).  BMI interpretation table: BMI level Category Range association with higher incidence of  chronic pain  <18 kg/m2 Underweight   18.5-24.9 kg/m2 Ideal body weight   25-29.9 kg/m2 Overweight Increased incidence by 20%  30-34.9 kg/m2 Obese (Class I) Increased incidence by 68%  35-39.9 kg/m2 Severe obesity (Class II) Increased incidence by 136%  >40 kg/m2 Extreme obesity (Class III) Increased incidence by 254%   Patient's current BMI Ideal Body weight  There is no height or weight on file to calculate BMI. Patient weight not recorded   BMI Readings from Last 4 Encounters:  09/23/17 40.06 kg/m  08/05/17 40.17 kg/m  07/25/17 40.25 kg/m  05/27/17 40.25 kg/m   Wt Readings from Last 4 Encounters:  09/23/17 212 lb (96.2 kg)  08/05/17 212 lb 9.6 oz (96.4 kg)  07/25/17 213 lb (96.6 kg)  05/27/17 213 lb (96.6 kg)    ____________________________________________________________________________________________  Pain Scale  Introduction: The pain score used by this practice is the Verbal Numerical Rating Scale (VNRS-11). This is an 11-point scale. It is for adults and children 10 years or older. There are significant differences in how the pain score is reported, used, and applied. Forget everything you learned in the past and learn this scoring system.  General Information: The scale should reflect your current level of pain. Unless you are specifically asked for the level of your worst pain, or your average pain. If you are asked for one of these two, then it should be understood that it is over the past 24 hours.  Basic Activities of Daily Living (ADL): Personal hygiene, dressing, eating, transferring, and using restroom.  Instructions: Most patients tend to report their level of pain as a combination of two factors, their physical pain and their psychosocial pain. This last one is also known as "suffering" and it is reflection of how physical pain affects you socially and psychologically. From now on, report them separately. From this point on, when asked to report your pain  level, report only your physical pain. Use the following table for reference.  Pain Clinic Pain Levels (0-5/10)  Pain Level Score  Description  No Pain 0   Mild pain 1 Nagging, annoying, but does not interfere with basic activities of daily living (ADL). Patients are able to eat, bathe, get dressed, toileting (being able to get on and off the toilet and perform personal hygiene functions), transfer (move in and out of bed or a chair without assistance), and maintain continence (able to control bladder and bowel functions). Blood pressure and heart rate are unaffected. A normal heart rate for a healthy adult ranges from 60 to 100 bpm (beats per minute).   Mild to moderate pain 2 Noticeable and distracting. Impossible to hide from other people. More frequent flare-ups. Still possible to adapt and function close to normal. It can be very annoying and may have occasional stronger flare-ups. With discipline, patients may get used to it and adapt.   Moderate pain 3 Interferes significantly with activities of daily living (ADL). It becomes difficult to feed, bathe, get dressed, get on and off the toilet or to perform personal hygiene functions. Difficult to get in and out of bed or a chair without assistance. Very distracting. With effort, it can be ignored when deeply involved in activities.   Moderately severe pain 4 Impossible to ignore for more than a few minutes. With effort, patients may still be able to manage work or participate in some social activities. Very difficult to concentrate. Signs of autonomic nervous system discharge are evident: dilated pupils (mydriasis); mild sweating (diaphoresis); sleep interference. Heart rate becomes elevated (>115 bpm). Diastolic blood pressure (lower number) rises above 100 mmHg. Patients find relief in laying down and not moving.   Severe pain 5 Intense and extremely unpleasant. Associated with frowning face and frequent crying. Pain overwhelms the senses.   Ability to do any activity or maintain social relationships becomes significantly limited. Conversation becomes difficult. Pacing back and forth is common, as getting into a comfortable position is nearly impossible. Pain wakes you up from deep sleep. Physical signs will be obvious: pupillary dilation; increased sweating; goosebumps; brisk reflexes; cold, clammy hands and feet; nausea, vomiting or dry heaves; loss of appetite; significant sleep disturbance with inability to fall asleep or to remain asleep. When persistent, significant weight loss is observed due to the complete loss of appetite and sleep deprivation.  Blood pressure and heart rate becomes significantly elevated. Caution: If elevated blood pressure triggers a pounding headache associated with blurred vision, then the patient should immediately seek attention at an urgent or emergency care unit, as these may be signs of an impending stroke.    Emergency Department Pain Levels (6-10/10)  Emergency Room Pain 6 Severely limiting. Requires emergency care and should not be seen or managed at an outpatient pain management facility. Communication becomes difficult and requires great effort. Assistance to reach the emergency department may be required. Facial flushing and profuse sweating along with potentially dangerous increases in heart rate and blood pressure will be evident.   Distressing pain 7 Self-care is very difficult. Assistance is required to transport, or use restroom. Assistance to reach the emergency department will be required. Tasks requiring coordination, such as bathing and getting dressed become very difficult.   Disabling pain 8 Self-care is no longer possible. At this level, pain is disabling. The individual is unable to do even the most "basic" activities such as walking, eating, bathing, dressing, transferring to a bed, or toileting. Fine motor skills are lost. It is difficult to think clearly.   Incapacitating pain 9 Pain  becomes incapacitating. Thought processing is no  longer possible. Difficult to remember your own name. Control of movement and coordination are lost.   The worst pain imaginable 10 At this level, most patients pass out from pain. When this level is reached, collapse of the autonomic nervous system occurs, leading to a sudden drop in blood pressure and heart rate. This in turn results in a temporary and dramatic drop in blood flow to the brain, leading to a loss of consciousness. Fainting is one of the body's self defense mechanisms. Passing out puts the brain in a calmed state and causes it to shut down for a while, in order to begin the healing process.    Summary: 1. Refer to this scale when providing [place] with your pain level. 2. Be accurate and careful when reporting your pain level. This will help with your care. 3. Over-reporting your pain level will lead to loss of credibility. 4. Even a level of 1/10 means that there is pain and will be treated at our facility. 5. High, inaccurate reporting will be documented as "Symptom Exaggeration", leading to loss of credibility and suspicions of possible secondary gains such as obtaining more narcotics, or wanting to appear disabled, for fraudulent reasons. 6. Only pain levels of 5 or below will be seen at our facility. 7. Pain levels of 6 and above will be sent to the Emergency Department and the appointment cancelled. ____________________________________________________________________________________________

## 2017-11-19 NOTE — Progress Notes (Signed)
Nursing Pain Medication Assessment:  Safety precautions to be maintained throughout the outpatient stay will include: orient to surroundings, keep bed in low position, maintain call bell within reach at all times, provide assistance with transfer out of bed and ambulation.  Medication Inspection Compliance: Pill count conducted under aseptic conditions, in front of the patient. Neither the pills nor the bottle was removed from the patient's sight at any time. Once count was completed pills were immediately returned to the patient in their original bottle.  Medication: Oxycodone/APAP Pill/Patch Count: 45 of 66 pills remain Pill/Patch Appearance: Markings consistent with prescribed medication Bottle Appearance: Standard pharmacy container. Clearly labeled. Filled Date: 74 / 16 / 2019 Last Medication intake:  Today

## 2017-11-19 NOTE — Patient Instructions (Addendum)
____________________________________________________________________________________________  Medication Rules  Applies to: All patients receiving prescriptions (written or electronic).  Pharmacy of record: Pharmacy where electronic prescriptions will be sent. If written prescriptions are taken to a different pharmacy, please inform the nursing staff. The pharmacy listed in the electronic medical record should be the one where you would like electronic prescriptions to be sent.  Prescription refills: Only during scheduled appointments. Applies to both, written and electronic prescriptions.  NOTE: The following applies primarily to controlled substances (Opioid* Pain Medications).   Patient's responsibilities: 1. Pain Pills: Bring all pain pills to every appointment (except for procedure appointments). 2. Pill Bottles: Bring pills in original pharmacy bottle. Always bring newest bottle. Bring bottle, even if empty. 3. Medication refills: You are responsible for knowing and keeping track of what medications you need refilled. The day before your appointment, write a list of all prescriptions that need to be refilled. Bring that list to your appointment and give it to the admitting nurse. Prescriptions will be written only during appointments. If you forget a medication, it will not be "Called in", "Faxed", or "electronically sent". You will need to get another appointment to get these prescribed. 4. Prescription Accuracy: You are responsible for carefully inspecting your prescriptions before leaving our office. Have the discharge nurse carefully go over each prescription with you, before taking them home. Make sure that your name is accurately spelled, that your address is correct. Check the name and dose of your medication to make sure it is accurate. Check the number of pills, and the written instructions to make sure they are clear and accurate. Make sure that you are given enough medication to last  until your next medication refill appointment. 5. Taking Medication: Take medication as prescribed. Never take more pills than instructed. Never take medication more frequently than prescribed. Taking less pills or less frequently is permitted and encouraged, when it comes to controlled substances (written prescriptions).  6. Inform other Doctors: Always inform, all of your healthcare providers, of all the medications you take. 7. Pain Medication from other Providers: You are not allowed to accept any additional pain medication from any other Doctor or Healthcare provider. There are two exceptions to this rule. (see below) In the event that you require additional pain medication, you are responsible for notifying us, as stated below. 8. Medication Agreement: You are responsible for carefully reading and following our Medication Agreement. This must be signed before receiving any prescriptions from our practice. Safely store a copy of your signed Agreement. Violations to the Agreement will result in no further prescriptions. (Additional copies of our Medication Agreement are available upon request.) 9. Laws, Rules, & Regulations: All patients are expected to follow all Federal and State Laws, Statutes, Rules, & Regulations. Ignorance of the Laws does not constitute a valid excuse. The use of any illegal substances is prohibited. 10. Adopted CDC guidelines & recommendations: Target dosing levels will be at or below 60 MME/day. Use of benzodiazepines** is not recommended.  Exceptions: There are only two exceptions to the rule of not receiving pain medications from other Healthcare Providers. 1. Exception #1 (Emergencies): In the event of an emergency (i.e.: accident requiring emergency care), you are allowed to receive additional pain medication. However, you are responsible for: As soon as you are able, call our office [phone], at any time of the day or night, and leave a message stating your name, the  date and nature of the emergency, and the name and dose of the medication   prescribed. In the event that your call is answered by a member of our staff, make sure to document and save the date, time, and the name of the person that took your information.  2. Exception #2 (Planned Surgery): In the event that you are scheduled by another doctor or dentist to have any type of surgery or procedure, you are allowed (for a period no longer than 30 days), to receive additional pain medication, for the acute post-op pain. However, in this case, you are responsible for picking up a copy of our "Post-op Pain Management for Surgeons" handout, and giving it to your surgeon or dentist. This document is available at our office, and does not require an appointment to obtain it. Simply go to our office during business hours (Monday-Thursday from 8:00 AM to 4:00 PM) (Friday 8:00 AM to 12:00 Noon) or if you have a scheduled appointment with [place], prior to your surgery, and ask for it by name. In addition, you will need to provide [place] with your name, name of your surgeon, type of surgery, and date of procedure or surgery.  *Opioid medications include: morphine, codeine, oxycodone, oxymorphone, hydrocodone, hydromorphone, meperidine, tramadol, tapentadol, buprenorphine, fentanyl, methadone. **Benzodiazepine medications include: diazepam (Valium), alprazolam (Xanax), clonazepam (Klonopine), lorazepam (Ativan), clorazepate (Tranxene), chlordiazepoxide (Librium), estazolam (Prosom), oxazepam (Serax), temazepam (Restoril), triazolam (Halcion) (Last updated: 04/18/2017) ____________________________________________________________________________________________ BMI Assessment: Estimated body mass index is 40.06 kg/m as calculated from the following:   Height as of 09/23/17: 5\' 1"  (1.549 m).   Weight as of 09/23/17: 212 lb (96.2 kg).  BMI interpretation table: BMI level Category Range association with higher incidence of chronic pain   <18 kg/m2 Underweight   18.5-24.9 kg/m2 Ideal body weight   25-29.9 kg/m2 Overweight Increased incidence by 20%  30-34.9 kg/m2 Obese (Class I) Increased incidence by 68%  35-39.9 kg/m2 Severe obesity (Class II) Increased incidence by 136%  >40 kg/m2 Extreme obesity (Class III) Increased incidence by 254%   Patient's current BMI Ideal Body weight  There is no height or weight on file to calculate BMI. Patient weight not recorded   BMI Readings from Last 4 Encounters:  09/23/17 40.06 kg/m  08/05/17 40.17 kg/m  07/25/17 40.25 kg/m  05/27/17 40.25 kg/m   Wt Readings from Last 4 Encounters:  09/23/17 212 lb (96.2 kg)  08/05/17 212 lb 9.6 oz (96.4 kg)  07/25/17 213 lb (96.6 kg)  05/27/17 213 lb (96.6 kg)    ____________________________________________________________________________________________  Pain Scale  Introduction: The pain score used by this practice is the Verbal Numerical Rating Scale (VNRS-11). This is an 11-point scale. It is for adults and children 10 years or older. There are significant differences in how the pain score is reported, used, and applied. Forget everything you learned in the past and learn this scoring system.  General Information: The scale should reflect your current level of pain. Unless you are specifically asked for the level of your worst pain, or your average pain. If you are asked for one of these two, then it should be understood that it is over the past 24 hours.  Basic Activities of Daily Living (ADL): Personal hygiene, dressing, eating, transferring, and using restroom.  Instructions: Most patients tend to report their level of pain as a combination of two factors, their physical pain and their psychosocial pain. This last one is also known as "suffering" and it is reflection of how physical pain affects you socially and psychologically. From now on, report them separately. From this point on,  when asked to report your pain level, report  only your physical pain. Use the following table for reference.  Pain Clinic Pain Levels (0-5/10)  Pain Level Score  Description  No Pain 0   Mild pain 1 Nagging, annoying, but does not interfere with basic activities of daily living (ADL). Patients are able to eat, bathe, get dressed, toileting (being able to get on and off the toilet and perform personal hygiene functions), transfer (move in and out of bed or a chair without assistance), and maintain continence (able to control bladder and bowel functions). Blood pressure and heart rate are unaffected. A normal heart rate for a healthy adult ranges from 60 to 100 bpm (beats per minute).   Mild to moderate pain 2 Noticeable and distracting. Impossible to hide from other people. More frequent flare-ups. Still possible to adapt and function close to normal. It can be very annoying and may have occasional stronger flare-ups. With discipline, patients may get used to it and adapt.   Moderate pain 3 Interferes significantly with activities of daily living (ADL). It becomes difficult to feed, bathe, get dressed, get on and off the toilet or to perform personal hygiene functions. Difficult to get in and out of bed or a chair without assistance. Very distracting. With effort, it can be ignored when deeply involved in activities.   Moderately severe pain 4 Impossible to ignore for more than a few minutes. With effort, patients may still be able to manage work or participate in some social activities. Very difficult to concentrate. Signs of autonomic nervous system discharge are evident: dilated pupils (mydriasis); mild sweating (diaphoresis); sleep interference. Heart rate becomes elevated (>115 bpm). Diastolic blood pressure (lower number) rises above 100 mmHg. Patients find relief in laying down and not moving.   Severe pain 5 Intense and extremely unpleasant. Associated with frowning face and frequent crying. Pain overwhelms the senses.  Ability to do any  activity or maintain social relationships becomes significantly limited. Conversation becomes difficult. Pacing back and forth is common, as getting into a comfortable position is nearly impossible. Pain wakes you up from deep sleep. Physical signs will be obvious: pupillary dilation; increased sweating; goosebumps; brisk reflexes; cold, clammy hands and feet; nausea, vomiting or dry heaves; loss of appetite; significant sleep disturbance with inability to fall asleep or to remain asleep. When persistent, significant weight loss is observed due to the complete loss of appetite and sleep deprivation.  Blood pressure and heart rate becomes significantly elevated. Caution: If elevated blood pressure triggers a pounding headache associated with blurred vision, then the patient should immediately seek attention at an urgent or emergency care unit, as these may be signs of an impending stroke.    Emergency Department Pain Levels (6-10/10)  Emergency Room Pain 6 Severely limiting. Requires emergency care and should not be seen or managed at an outpatient pain management facility. Communication becomes difficult and requires great effort. Assistance to reach the emergency department may be required. Facial flushing and profuse sweating along with potentially dangerous increases in heart rate and blood pressure will be evident.   Distressing pain 7 Self-care is very difficult. Assistance is required to transport, or use restroom. Assistance to reach the emergency department will be required. Tasks requiring coordination, such as bathing and getting dressed become very difficult.   Disabling pain 8 Self-care is no longer possible. At this level, pain is disabling. The individual is unable to do even the most "basic" activities such as walking, eating, bathing, dressing,  transferring to a bed, or toileting. Fine motor skills are lost. It is difficult to think clearly.   Incapacitating pain 9 Pain becomes  incapacitating. Thought processing is no longer possible. Difficult to remember your own name. Control of movement and coordination are lost.   The worst pain imaginable 10 At this level, most patients pass out from pain. When this level is reached, collapse of the autonomic nervous system occurs, leading to a sudden drop in blood pressure and heart rate. This in turn results in a temporary and dramatic drop in blood flow to the brain, leading to a loss of consciousness. Fainting is one of the body's self defense mechanisms. Passing out puts the brain in a calmed state and causes it to shut down for a while, in order to begin the healing process.    Summary: 1. Refer to this scale when providing [place] with your pain level. 2. Be accurate and careful when reporting your pain level. This will help with your care. 3. Over-reporting your pain level will lead to loss of credibility. 4. Even a level of 1/10 means that there is pain and will be treated at our facility. 5. High, inaccurate reporting will be documented as "Symptom Exaggeration", leading to loss of credibility and suspicions of possible secondary gains such as obtaining more narcotics, or wanting to appear disabled, for fraudulent reasons. 6. Only pain levels of 5 or below will be seen at our facility. 7. Pain levels of 6 and above will be sent to the Emergency Department and the appointment cancelled. ____________________________________________________________________________________________

## 2017-11-21 ENCOUNTER — Ambulatory Visit: Payer: Medicare Other | Admitting: Nurse Practitioner

## 2017-12-06 ENCOUNTER — Encounter: Payer: Self-pay | Admitting: Family Medicine

## 2017-12-06 ENCOUNTER — Ambulatory Visit (INDEPENDENT_AMBULATORY_CARE_PROVIDER_SITE_OTHER): Payer: Medicare Other | Admitting: Family Medicine

## 2017-12-06 VITALS — BP 136/62 | HR 74 | Temp 98.1°F | Resp 14 | Ht 62.0 in | Wt 210.4 lb

## 2017-12-06 DIAGNOSIS — R809 Proteinuria, unspecified: Secondary | ICD-10-CM | POA: Diagnosis not present

## 2017-12-06 DIAGNOSIS — M810 Age-related osteoporosis without current pathological fracture: Secondary | ICD-10-CM

## 2017-12-06 DIAGNOSIS — Z23 Encounter for immunization: Secondary | ICD-10-CM

## 2017-12-06 DIAGNOSIS — I700 Atherosclerosis of aorta: Secondary | ICD-10-CM | POA: Diagnosis not present

## 2017-12-06 DIAGNOSIS — E213 Hyperparathyroidism, unspecified: Secondary | ICD-10-CM | POA: Diagnosis not present

## 2017-12-06 DIAGNOSIS — E1129 Type 2 diabetes mellitus with other diabetic kidney complication: Secondary | ICD-10-CM

## 2017-12-06 DIAGNOSIS — N183 Chronic kidney disease, stage 3 unspecified: Secondary | ICD-10-CM

## 2017-12-06 DIAGNOSIS — D508 Other iron deficiency anemias: Secondary | ICD-10-CM

## 2017-12-06 LAB — POCT GLYCOSYLATED HEMOGLOBIN (HGB A1C): Hemoglobin A1C: 7.4 % — AB (ref 4.0–5.6)

## 2017-12-06 NOTE — Progress Notes (Signed)
Name: Taylor [name]   MRN: [id number]    DOB: [birth year]   Date:12/06/2017       Progress Note  Subjective  Chief Complaint  Chief Complaint  Patient presents with  . Follow-up    4 mth f/u  . Diabetes  . Hypertension  . Hypercholesterolemia  . Gastroesophageal Reflux  . Osteoporosis  . OA  . Obesity  . Aortic Atherolsclerosis  . vitamin d deficiency  . Anemia    HPI   RFF:MBWGY well on ARB, no side effects.  No chest pain or palpitation  Hyperlipidemia: she is taking pravastatin and Omega 3 fatty acids, no myalgia. She continues to have hand cramps, she has hyperparathyroidism, referral is being placed for her to see nephrologist   DM:she states glucose in am from 130's-200's - it goes up when she forgets to take medication at night,She denies polyphagia, polydipsia or polyuria. Eye exam is up to date foot exam done by Dr. Garry Heater has microalbuminuria and is taking ARB. She also has dyslipidemia with low HDL, but LDL is at goal on statin therapy, currently also on Lovaza. HgbA1C today is 7.4%  GERD: sheis offOmeprazole,doing wlel on Ranitidine prn, symptoms controlled at this time, very seldom has heartburn.   KZ:LDJTT/ right knee /chronic back pain:explained the risk of narcotics,she goes to pain clinic, Dr. Andree Elk. Discussed referral to Ortho but she states she does not want to have another surgery. She still has severe daily pain   Osteoporosis: GFR above 35 , still on Fosamax, seen by Dr. Manfred Shirts and is getting Prolia started 10/2017 - no side effects   Patient Active Problem List   Diagnosis Date Noted  . Age-related osteoporosis without current pathological fracture 09/26/2017  . Pain in both hands 09/23/2017  . Hyperparathyroidism ([place]) 08/06/2017  . Chronic kidney disease, stage III (moderate) ([place]) 08/06/2017  . HLD (hyperlipidemia) 07/25/2017  . Osteoporotic compression fracture of spine ([place]) 03/22/2017  . Assistance needed with  transportation 03/21/2017  . Iron deficiency anemia 12/19/2016  . Aortic atherosclerosis ([place]) 11/23/2016  . Anterolisthesis 11/23/2016  . Chronic pain syndrome 09/13/2016  . Vitamin D deficiency 08/19/2016  . Anemia, unspecified 08/19/2016  . Primary open angle glaucoma (POAG) of both eyes, severe stage 08/17/2016  . Hypertensive retinopathy 08/17/2016  . Long term current use of opiate analgesic 07/19/2016  . Dyslipidemia associated with type 2 diabetes mellitus ([place]) 04/16/2016  . Chronic midline low back pain without sciatica 04/16/2016  . Chronic pain of right knee 04/16/2016  . Vaginal dryness 02/22/2015  . Calculus of kidney 11/10/2014  . Osteoarthritis, multiple sites 09/09/2014  . Type 2 diabetes mellitus with microalbuminuria ([place]) 09/09/2014  . Glaucoma 09/09/2014  . BP (high blood pressure) 09/09/2014  . Chronic pain 09/09/2014  . Tinea corporis 09/09/2014  . Umbilical hernia 01/77/9390  . Lump or mass in breast     Past Surgical History:  Procedure Laterality Date  . ABDOMINAL HYSTERECTOMY  1958   menorrhagia  . BACK SURGERY    . BREAST BIOPSY Left 01/25/2012  . CATARACT EXTRACTION, BILATERAL Bilateral    1st 06/07/15 and the 2nd 06/21/15  . CHOLECYSTECTOMY    . COLONOSCOPY  2003   [facility]  . CORONARY ARTERY BYPASS GRAFT Left 01/03/2012,01/24/2012   Left FNA, Left breast Encore bx  . KNEE SURGERY  2009,2011,2012   twice on right and once on left  . KNEE SURGERY    . LIPOMA EXCISION  1998  . [place]  2004  .  TONSILLECTOMY     age of 71    Family History  Problem Relation Age of Onset  . Cancer Mother        skin cancer  . Stroke Mother   . Heart disease Father   . Breast cancer Neg Hx     Social History   Socioeconomic History  . Marital status: Divorced    Spouse name: Not on file  . Number of children: 4  . Years of education: Not on file  . Highest education level: Not on file  Occupational History  . 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 Financial resource strain: Somewhat hard  . Food insecurity:    Worry: Never true    Inability: Never true  . Transportation needs:    Medical: Yes    Non-medical: Yes  Tobacco Use  . Smoking status: Never Smoker  . Smokeless tobacco: Never Used  Substance and Sexual Activity  . Alcohol use: No    Alcohol/week: 0.0 standard drinks  . Drug use: No  . Sexual activity: Never  Lifestyle  . Physical activity:    Days per week: 0 days    Minutes per session: 0 min  . Stress: Not at all  Relationships  . Social connections:    Talks on phone: More than three times a week    Gets together: Twice a week    Attends religious service: More than 4 times per year    Active member of club or organization: Yes    Attends meetings of clubs or organizations: More than 4 times per year    Relationship status: Divorced  . Intimate partner violence:    Fear of current or ex partner: No    Emotionally abused: No    Physically abused: No    Forced sexual activity: No  Other Topics Concern  . Not on file  Social History Narrative   Lives alone   Children do not live in town   Friend helps out: Cira Rue - [phone]   Independent prior to her fall 10/2016     Current Outpatient Medications:  .  acetaminophen (TYLENOL) 500 MG tablet, Take 1 tablet (500 mg total) by mouth 3 (three) times daily. (Patient taking differently: Take 500 mg by mouth as needed. ), Disp: 90 tablet, Rfl: 0 .  alendronate (FOSAMAX) 70 MG tablet, TAKE 1 TABLET WEEKLY, Disp: 12 tablet, Rfl: 1 .  amLODipine (NORVASC) 10 MG tablet, Take 1 tablet (10 mg total) by mouth daily., Disp: 90 tablet, Rfl: 0 .  bimatoprost (LUMIGAN) 0.01 % SOLN, Place 1 drop into both eyes daily. , Disp: , Rfl:  .  brimonidine-timolol (COMBIGAN) 0.2-0.5 % ophthalmic solution, Place 1 drop into both eyes See admin instructions. 1 drop into both eyes in the morning, then 1 drop into  both eyes 8 hours later, Disp: , Rfl:  .  Bromfenac Sodium 0.09 % SOLN, APPLY 1 DROP INTO AFFECTED EYE DAILY, Disp: , Rfl:  .  Cholecalciferol (VITAMIN D-1000 MAX ST) 1000 UNITS tablet, Take 1,000 Units by mouth daily. Reported on 08/05/2015, Disp: , Rfl:  .  conjugated estrogens (PREMARIN) vaginal cream, Place 1 Applicatorful vaginally daily., Disp: 42.5 g, Rfl: 5 .  fluticasone (FLONASE) 50 MCG/ACT nasal spray, SPRAY 2 SPRAYS INTO EACH NOSTRIL EVERY DAY, Disp: 16 g, Rfl: 1 .  glucose blood test strip, Use as instructed, Disp: 100 each, Rfl: 12 .  metFORMIN (GLUCOPHAGE-XR) 750 MG 24 hr tablet, Take 2 tablets (1,500 mg total) by mouth daily with breakfast., Disp: 180 tablet, Rfl: 1 .  olmesartan (BENICAR) 20 MG tablet, Take 1 tablet (20 mg total) by mouth daily., Disp: 90 tablet, Rfl: 1 .  omega-3 acid ethyl esters (LOVAZA) 1 g capsule, TAKE 1 CAPSULE (1 G TOTAL) BY MOUTH 2 (TWO) TIMES DAILY., Disp: 180 capsule, Rfl: 1 .  ONETOUCH DELICA LANCETS FINE MISC, 1 each by Does not apply route daily. 30 G Lancets- One Touch Delica, Disp: 130 each, Rfl: 1 .  [START ON 01/02/2018] oxyCODONE-acetaminophen (PERCOCET) 7.5-325 MG tablet, Take 1 tablet by mouth every 8 (eight) hours as needed for moderate pain or severe pain., Disp: 90 tablet, Rfl: 0 .  oxyCODONE-acetaminophen (PERCOCET) 7.5-325 MG tablet, Take 1 tablet by mouth every 8 (eight) hours as needed for moderate pain or severe pain., Disp: 90 tablet, Rfl: 0 .  pravastatin (PRAVACHOL) 40 MG tablet, Take 1 tablet (40 mg total) by mouth at bedtime., Disp: 90 tablet, Rfl: 1 .  ranitidine (ZANTAC) 150 MG tablet, TAKE 1 TABLET BY MOUTH DAILY AS NEEDED FOR HEARTBURN,, Disp: 90 tablet, Rfl: 0 .  Vitamin D, Ergocalciferol, (DRISDOL) 50000 units CAPS capsule, TAKE 1 CAPSULE (50,000 UNITS TOTAL) BY MOUTH EVERY 7 (SEVEN) DAYS., Disp: 12 capsule, Rfl: 0  Allergies  Allergen Reactions  . Ace Inhibitors     cough    I personally reviewed active problem list,  medication list, allergies, family history, social history with the patient/caregiver today.   ROS  Constitutional: Negative for fever or weight change.  Respiratory: Negative for cough and shortness of breath.   Cardiovascular: Negative for chest pain or palpitations.  Gastrointestinal: Negative for abdominal pain, no bowel changes.  Musculoskeletal: Positive  for gait problem and  joint swelling.  Skin: Negative for rash.  Neurological: Negative for dizziness or headache.  No other specific complaints in a complete review of systems (except as listed in HPI above).  Objective  Vitals:   12/06/17 1138  BP: 136/62  Pulse: 74  Resp: 14  Temp: 98.1 F (36.7 C)  TempSrc: Oral  SpO2: 99%  Weight: 210 lb 6.4 oz (95.4 kg)  Height: 5\' 2"  (1.575 m)    Body mass index is 38.48 kg/m.  Physical Exam  Constitutional: Patient appears well-developed and well-nourished. Obese  No distress.  HEENT: head atraumatic, normocephalic, pupils equal and reactive to light,  neck supple, throat within normal limits Cardiovascular: Normal rate, regular rhythm and normal heart sounds.  No murmur heard. No BLE edema. Pulmonary/Chest: Effort normal and breath sounds normal. No respiratory distress. Abdominal: Soft.  There is no tenderness. Psychiatric: Patient has a normal mood and affect. behavior is normal. Judgment and thought content normal.  PHQ2/9: Depression screen [facility] 2/9 12/06/2017 11/19/2017 09/23/2017 08/05/2017 07/25/2017  Decreased Interest 0 0 0 1 0  Down, Depressed, Hopeless 0 - 0 0 0  PHQ - 2 Score 0 0 0 1 0  Altered sleeping 0 - - 0 -  Tired, decreased energy 0 - - 1 -  Change in appetite 0 - - 0 -  Feeling bad or failure about yourself  0 - - 0 -  Trouble concentrating 0 - - 0 -  Moving slowly or fidgety/restless 0 - - 0 -  Suicidal thoughts 0 - - 0 -  PHQ-9 Score 0 - - 2 -  Difficult doing work/chores Not difficult at all - - Not difficult at all -  Fall Risk: Fall Risk   11/19/2017 09/23/2017 08/05/2017 07/25/2017 05/27/2017  Falls in the past year? No No No No No  Comment - - - - -  Number falls in past yr: - - - - -  Injury with Fall? - - - - -  Risk Factor Category  - - - - -     Functional Status Survey: Is the patient deaf or have difficulty hearing?: Yes Does the patient have difficulty seeing, even when wearing glasses/contacts?: No Does the patient have difficulty concentrating, remembering, or making decisions?: No Does the patient have difficulty walking or climbing stairs?: Yes Does the patient have difficulty dressing or bathing?: Yes Does the patient have difficulty doing errands alone such as visiting a doctor's office or shopping?: Yes    Assessment & Plan  1. Type 2 diabetes mellitus with microalbuminuria, without long-term current use of insulin ([place])  - Ambulatory referral to Nephrology - POCT HgB A1C  2. Need for influenza vaccination  - Flu vaccine HIGH DOSE PF  3. Hyperparathyroidism ([place])  - Ambulatory referral to Nephrology  4. Aortic atherosclerosis ([facility])  Continue medication   5. Age-related osteoporosis without current pathological fracture  Continue prolia, still on fosamax - seeing Dr. Honor Junes   6. Chronic kidney disease, stage III (moderate) ([place])  - Ambulatory referral to Nephrology  7. Other iron deficiency anemia  - POC Hemoccult Bld/Stl (3-Cd Home Screen); Future

## 2017-12-19 ENCOUNTER — Ambulatory Visit (INDEPENDENT_AMBULATORY_CARE_PROVIDER_SITE_OTHER): Payer: Medicare Other

## 2017-12-19 ENCOUNTER — Other Ambulatory Visit: Payer: Self-pay | Admitting: Family Medicine

## 2017-12-19 DIAGNOSIS — D508 Other iron deficiency anemias: Secondary | ICD-10-CM | POA: Diagnosis not present

## 2017-12-19 DIAGNOSIS — Z1211 Encounter for screening for malignant neoplasm of colon: Secondary | ICD-10-CM | POA: Diagnosis not present

## 2017-12-19 DIAGNOSIS — I10 Essential (primary) hypertension: Secondary | ICD-10-CM

## 2017-12-19 LAB — POC HEMOCCULT BLD/STL (HOME/3-CARD/SCREEN)
Card #1 Date: 102419
Card #2 Date: 102719
Card #2 Fecal Occult Blod, POC: NEGATIVE
Card #3 Date: 102919
Card #3 Fecal Occult Blood, POC: NEGATIVE
Fecal Occult Blood, POC: NEGATIVE

## 2017-12-26 ENCOUNTER — Other Ambulatory Visit: Payer: Self-pay | Admitting: Family Medicine

## 2017-12-26 DIAGNOSIS — E782 Mixed hyperlipidemia: Secondary | ICD-10-CM

## 2018-01-05 ENCOUNTER — Other Ambulatory Visit: Payer: Self-pay | Admitting: Family Medicine

## 2018-01-05 DIAGNOSIS — E7800 Pure hypercholesterolemia, unspecified: Secondary | ICD-10-CM

## 2018-01-09 ENCOUNTER — Other Ambulatory Visit: Payer: Self-pay | Admitting: Family Medicine

## 2018-01-09 DIAGNOSIS — K219 Gastro-esophageal reflux disease without esophagitis: Secondary | ICD-10-CM

## 2018-01-09 DIAGNOSIS — M810 Age-related osteoporosis without current pathological fracture: Secondary | ICD-10-CM

## 2018-01-09 MED ORDER — FAMOTIDINE 20 MG PO TABS: 20.0000 mg | ORAL_TABLET | Freq: Every day | ORAL | 0 refills | Status: DC

## 2018-01-20 ENCOUNTER — Other Ambulatory Visit: Payer: Self-pay | Admitting: Family Medicine

## 2018-01-20 DIAGNOSIS — M810 Age-related osteoporosis without current pathological fracture: Secondary | ICD-10-CM

## 2018-01-21 ENCOUNTER — Ambulatory Visit: Payer: Medicare Other | Attending: Nurse Practitioner | Admitting: Nurse Practitioner

## 2018-01-21 ENCOUNTER — Other Ambulatory Visit: Payer: Self-pay

## 2018-01-21 ENCOUNTER — Encounter: Payer: Self-pay | Admitting: Nurse Practitioner

## 2018-01-21 VITALS — BP 141/61 | Temp 97.7°F | Resp 16 | Ht 61.5 in | Wt 212.0 lb

## 2018-01-21 DIAGNOSIS — M810 Age-related osteoporosis without current pathological fracture: Secondary | ICD-10-CM | POA: Insufficient documentation

## 2018-01-21 DIAGNOSIS — M79642 Pain in left hand: Secondary | ICD-10-CM

## 2018-01-21 DIAGNOSIS — M545 Low back pain: Secondary | ICD-10-CM

## 2018-01-21 DIAGNOSIS — Z888 Allergy status to other drugs, medicaments and biological substances status: Secondary | ICD-10-CM | POA: Insufficient documentation

## 2018-01-21 DIAGNOSIS — G8929 Other chronic pain: Secondary | ICD-10-CM

## 2018-01-21 DIAGNOSIS — Z5181 Encounter for therapeutic drug level monitoring: Secondary | ICD-10-CM | POA: Insufficient documentation

## 2018-01-21 DIAGNOSIS — K219 Gastro-esophageal reflux disease without esophagitis: Secondary | ICD-10-CM | POA: Diagnosis not present

## 2018-01-21 DIAGNOSIS — E1122 Type 2 diabetes mellitus with diabetic chronic kidney disease: Secondary | ICD-10-CM | POA: Insufficient documentation

## 2018-01-21 DIAGNOSIS — H35039 Hypertensive retinopathy, unspecified eye: Secondary | ICD-10-CM | POA: Insufficient documentation

## 2018-01-21 DIAGNOSIS — H401133 Primary open-angle glaucoma, bilateral, severe stage: Secondary | ICD-10-CM | POA: Diagnosis not present

## 2018-01-21 DIAGNOSIS — Z79890 Hormone replacement therapy (postmenopausal): Secondary | ICD-10-CM | POA: Diagnosis not present

## 2018-01-21 DIAGNOSIS — Z9049 Acquired absence of other specified parts of digestive tract: Secondary | ICD-10-CM | POA: Insufficient documentation

## 2018-01-21 DIAGNOSIS — I129 Hypertensive chronic kidney disease with stage 1 through stage 4 chronic kidney disease, or unspecified chronic kidney disease: Secondary | ICD-10-CM | POA: Diagnosis not present

## 2018-01-21 DIAGNOSIS — E785 Hyperlipidemia, unspecified: Secondary | ICD-10-CM | POA: Insufficient documentation

## 2018-01-21 DIAGNOSIS — E559 Vitamin D deficiency, unspecified: Secondary | ICD-10-CM | POA: Insufficient documentation

## 2018-01-21 DIAGNOSIS — Z7984 Long term (current) use of oral hypoglycemic drugs: Secondary | ICD-10-CM | POA: Diagnosis not present

## 2018-01-21 DIAGNOSIS — Z79891 Long term (current) use of opiate analgesic: Secondary | ICD-10-CM | POA: Insufficient documentation

## 2018-01-21 DIAGNOSIS — Z9889 Other specified postprocedural states: Secondary | ICD-10-CM | POA: Insufficient documentation

## 2018-01-21 DIAGNOSIS — M25561 Pain in right knee: Secondary | ICD-10-CM | POA: Diagnosis not present

## 2018-01-21 DIAGNOSIS — M79641 Pain in right hand: Secondary | ICD-10-CM

## 2018-01-21 DIAGNOSIS — Z79899 Other long term (current) drug therapy: Secondary | ICD-10-CM | POA: Insufficient documentation

## 2018-01-21 DIAGNOSIS — M159 Polyosteoarthritis, unspecified: Secondary | ICD-10-CM | POA: Diagnosis not present

## 2018-01-21 DIAGNOSIS — N183 Chronic kidney disease, stage 3 (moderate): Secondary | ICD-10-CM | POA: Insufficient documentation

## 2018-01-21 DIAGNOSIS — G894 Chronic pain syndrome: Secondary | ICD-10-CM | POA: Diagnosis not present

## 2018-01-21 MED ORDER — OXYCODONE-ACETAMINOPHEN 7.5-325 MG PO TABS: 1.0000 | ORAL_TABLET | Freq: Three times a day (TID) | ORAL | 0 refills | Status: DC | PRN

## 2018-01-21 NOTE — Progress Notes (Signed)
Nursing Pain Medication Assessment:  Safety precautions to be maintained throughout the outpatient stay will include: orient to surroundings, keep bed in low position, maintain call bell within reach at all times, provide assistance with transfer out of bed and ambulation.  Medication Inspection Compliance: Pill count conducted under aseptic conditions, in front of the patient. Neither the pills nor the bottle was removed from the patient's sight at any time. Once count was completed pills were immediately returned to the patient in their original bottle.  Medication: Oxycodone/APAP Pill/Patch Count: 42 of 90 pills remain Pill/Patch Appearance: Markings consistent with prescribed medication Bottle Appearance: Standard pharmacy container. Clearly labeled. Filled Date: 21 / 15 / 2019 Last Medication intake:  Today

## 2018-01-21 NOTE — Patient Instructions (Addendum)
____________________________________________________________________________________________  Medication Rules  Purpose: To inform patients, and their family members, of our rules and regulations.  Applies to: All patients receiving prescriptions (written or electronic).  Pharmacy of record: Pharmacy where electronic prescriptions will be sent. If written prescriptions are taken to a different pharmacy, please inform the nursing staff. The pharmacy listed in the electronic medical record should be the one where you would like electronic prescriptions to be sent.  Electronic prescriptions: In compliance with the [place] Strengthen Opioid Misuse Prevention (STOP) Act of 2017 (Session Law 2017-74/H243), effective February 19, 2018, all controlled substances must be electronically prescribed. Calling prescriptions to the pharmacy will cease to exist.  Prescription refills: Only during scheduled appointments. Applies to all prescriptions.  NOTE: The following applies primarily to controlled substances (Opioid* Pain Medications).   Patient's responsibilities: 1. Pain Pills: Bring all pain pills to every appointment (except for procedure appointments). 2. Pill Bottles: Bring pills in original pharmacy bottle. Always bring the newest bottle. Bring bottle, even if empty. 3. Medication refills: You are responsible for knowing and keeping track of what medications you take and those you need refilled. The day before your appointment: write a list of all prescriptions that need to be refilled. The day of the appointment: give the list to the admitting nurse. Prescriptions will be written only during appointments. If you forget a medication: it will not be "Called in", "Faxed", or "electronically sent". You will need to get another appointment to get these prescribed. No early refills. Do not call asking to have your prescription filled early. 4. Prescription Accuracy: You are responsible for  carefully inspecting your prescriptions before leaving our office. Have the discharge nurse carefully go over each prescription with you, before taking them home. Make sure that your name is accurately spelled, that your address is correct. Check the name and dose of your medication to make sure it is accurate. Check the number of pills, and the written instructions to make sure they are clear and accurate. Make sure that you are given enough medication to last until your next medication refill appointment. 5. Taking Medication: Take medication as prescribed. When it comes to controlled substances, taking less pills or less frequently than prescribed is permitted and encouraged. Never take more pills than instructed. Never take medication more frequently than prescribed.  6. Inform other Doctors: Always inform, all of your healthcare providers, of all the medications you take. 7. Pain Medication from other Providers: You are not allowed to accept any additional pain medication from any other Doctor or Healthcare provider. There are two exceptions to this rule. (see below) In the event that you require additional pain medication, you are responsible for notifying us, as stated below. 8. Medication Agreement: You are responsible for carefully reading and following our Medication Agreement. This must be signed before receiving any prescriptions from our practice. Safely store a copy of your signed Agreement. Violations to the Agreement will result in no further prescriptions. (Additional copies of our Medication Agreement are available upon request.) 9. Laws, Rules, & Regulations: All patients are expected to follow all Federal and State Laws, Statutes, Rules, & Regulations. Ignorance of the Laws does not constitute a valid excuse. The use of any illegal substances is prohibited. 10. Adopted CDC guidelines & recommendations: Target dosing levels will be at or below 60 MME/day. Use of benzodiazepines** is not  recommended.  Exceptions: There are only two exceptions to the rule of not receiving pain medications from other Healthcare Providers. 1.   Exception #1 (Emergencies): In the event of an emergency (i.e.: accident requiring emergency care), you are allowed to receive additional pain medication. However, you are responsible for: As soon as you are able, call our office [phone], at any time of the day or night, and leave a message stating your name, the date and nature of the emergency, and the name and dose of the medication prescribed. In the event that your call is answered by a member of our staff, make sure to document and save the date, time, and the name of the person that took your information.  2. Exception #2 (Planned Surgery): In the event that you are scheduled by another doctor or dentist to have any type of surgery or procedure, you are allowed (for a period no longer than 30 days), to receive additional pain medication, for the acute post-op pain. However, in this case, you are responsible for picking up a copy of our "Post-op Pain Management for Surgeons" handout, and giving it to your surgeon or dentist. This document is available at our office, and does not require an appointment to obtain it. Simply go to our office during business hours (Monday-Thursday from 8:00 AM to 4:00 PM) (Friday 8:00 AM to 12:00 Noon) or if you have a scheduled appointment with [place], prior to your surgery, and ask for it by name. In addition, you will need to provide [place] with your name, name of your surgeon, type of surgery, and date of procedure or surgery.  *Opioid medications include: morphine, codeine, oxycodone, oxymorphone, hydrocodone, hydromorphone, meperidine, tramadol, tapentadol, buprenorphine, fentanyl, methadone. **Benzodiazepine medications include: diazepam (Valium), alprazolam (Xanax), clonazepam (Klonopine), lorazepam (Ativan), clorazepate (Tranxene), chlordiazepoxide (Librium), estazolam (Prosom),  oxazepam (Serax), temazepam (Restoril), triazolam (Halcion) (Last updated: 04/18/2017) ____________________________________________________________________________________________    BMI Assessment: Estimated body mass index is 39.41 kg/m as calculated from the following:   Height as of this encounter: 5' 1.5" (1.562 m).   Weight as of this encounter: 212 lb (96.2 kg).  BMI interpretation table: BMI level Category Range association with higher incidence of chronic pain  <18 kg/m2 Underweight   18.5-24.9 kg/m2 Ideal body weight   25-29.9 kg/m2 Overweight Increased incidence by 20%  30-34.9 kg/m2 Obese (Class I) Increased incidence by 68%  35-39.9 kg/m2 Severe obesity (Class II) Increased incidence by 136%  >40 kg/m2 Extreme obesity (Class III) Increased incidence by 254%   Patient's current BMI Ideal Body weight  Body mass index is 39.41 kg/m. Ideal body weight: 48.9 kg (107 lb 14.6 oz) Adjusted ideal body weight: 67.8 kg (149 lb 8.8 oz)   BMI Readings from Last 4 Encounters:  01/21/18 39.41 kg/m  12/06/17 38.48 kg/m  11/19/17 39.41 kg/m  09/23/17 40.06 kg/m   Wt Readings from Last 4 Encounters:  01/21/18 212 lb (96.2 kg)  12/06/17 210 lb 6.4 oz (95.4 kg)  11/19/17 212 lb (96.2 kg)  09/23/17 212 lb (96.2 kg)    Medication 02/01/18 and 03/03/2018

## 2018-01-21 NOTE — Progress Notes (Signed)
Patient's Name: KANDI BRUSSEAU  MRN: [id number]  Referring Provider: [name], MD  DOB: Apr 01, 1932  PCP: [name], MD  DOS: 01/21/2018  Note by: Vevelyn Francois NP  Service setting: Ambulatory outpatient  Specialty: Interventional Pain Management  Location: [facility] (AMB) Pain Management Facility    Patient type: Established    Primary Reason(s) for Visit: Encounter for prescription drug management. (Level of risk: moderate)  CC: Knee Pain (bilateral) and Hand Pain (bilateral)  HPI  Ms. Behlke is a 84 y.o. year old, female patient, who comes today for a medication management evaluation. She has Lump or mass in breast; Umbilical hernia; Osteoarthritis, multiple sites; Type 2 diabetes mellitus with microalbuminuria ([place]); Glaucoma; BP (high blood pressure); Chronic pain; Tinea corporis; Calculus of kidney; Vaginal dryness; Dyslipidemia associated with type 2 diabetes mellitus ([place]); Chronic midline low back pain without sciatica; Chronic pain of right knee; Long term current use of opiate analgesic; Primary open angle glaucoma (POAG) of both eyes, severe stage; Hypertensive retinopathy; Vitamin D deficiency; Anemia, unspecified; Chronic pain syndrome; Aortic atherosclerosis ([place]); Anterolisthesis; Iron deficiency anemia; Assistance needed with transportation; Osteoporotic compression fracture of spine ([place]); HLD (hyperlipidemia); Hyperparathyroidism ([place]); Chronic kidney disease, stage III (moderate) ([place]); Pain in both hands; and Age-related osteoporosis without current pathological fracture on their problem list. Her primarily concern today is the Knee Pain (bilateral) and Hand Pain (bilateral)  Pain Assessment: Location: Left, Right Knee(knee at night,  hand during the day) Radiating: denies Onset: More than a month ago Duration: Chronic pain Quality: Aching, Constant, Discomfort, Sore Severity: 7 /10 (subjective, self-reported pain score)  Note: Reported level is compatible with  observation. Clinically the patient looks like a 2/10 A 2/10 is viewed as "Mild to Moderate" and described as noticeable and distracting. Impossible to hide from other people. More frequent flare-ups. Still possible to adapt and function close to normal. It can be very annoying and may have occasional stronger flare-ups. With discipline, patients may get used to it and adapt.       When using our objective Pain Scale, levels between 6 and 10/10 are said to belong in an emergency room, as it progressively worsens from a 6/10, described as severely limiting, requiring emergency care not usually available at an outpatient pain management facility. At a 6/10 level, communication becomes difficult and requires great effort. Assistance to reach the emergency department may be required. Facial flushing and profuse sweating along with potentially dangerous increases in heart rate and blood pressure will be evident. Effect on ADL: prolonged walking,standing, holding things, opening bending Timing:   Modifying factors: medications, heating pad BP: (!) 141/61  HR:    Ms. Wrightson was last scheduled for an appointment on 11/19/2017 for medication management. During today's appointment we reviewed Ms. Jaber's chronic pain status, as well as her outpatient medication regimen. She admits that her pain is stable. She denies any side effects of her medication. She denies concerns today.  The patient  reports that she does not use drugs. Her body mass index is 39.41 kg/m.  Further details on both, my assessment(s), as well as the proposed treatment plan, please see below.  Controlled Substance Pharmacotherapy Assessment REMS (Risk Evaluation and Mitigation Strategy)  Analgesic:Oxycodone/acetaminophen 7.5/325 mg 3times daily MME/day:33.82m/day. GIgnatius Specking RN  01/21/2018  2:34 PM  Sign at close encounter Nursing Pain Medication Assessment:  Safety precautions to be maintained throughout the outpatient stay  will include: orient to surroundings, keep bed in low position, maintain call bell within  reach at all times, provide assistance with transfer out of bed and ambulation.  Medication Inspection Compliance: Pill count conducted under aseptic conditions, in front of the patient. Neither the pills nor the bottle was removed from the patient's sight at any time. Once count was completed pills were immediately returned to the patient in their original bottle.  Medication: Oxycodone/APAP Pill/Patch Count: 42 of 90 pills remain Pill/Patch Appearance: Markings consistent with prescribed medication Bottle Appearance: Standard pharmacy container. Clearly labeled. Filled Date: 64 / 15 / 2019 Last Medication intake:  Today   Pharmacokinetics: Liberation and absorption (onset of action): WNL Distribution (time to peak effect): WNL Metabolism and excretion (duration of action): WNL         Pharmacodynamics: Desired effects: Analgesia: Ms. Vanderschaaf reports >50% benefit. Functional ability: Patient reports that medication allows her to accomplish basic ADLs Clinically meaningful improvement in function (CMIF): Sustained CMIF goals met Perceived effectiveness: Described as relatively effective, allowing for increase in activities of daily living (ADL) Undesirable effects: Side-effects or Adverse reactions: None reported Monitoring: [place] PMP: Online review of the past 42-monthperiod conducted. Compliant with practice rules and regulations Last UDS on record: Summary  Date Value Ref Range Status  05/27/2017 FINAL  Final    Comment:    ==================================================================== TOXASSURE SELECT 13 (MW) ==================================================================== Test                             Result       Flag       Units Drug Present and Declared for Prescription Verification   Oxycodone                      494          EXPECTED   ng/mg creat   Oxymorphone                     164          EXPECTED   ng/mg creat   Noroxycodone                   540          EXPECTED   ng/mg creat    Sources of oxycodone include scheduled prescription medications.    Oxymorphone and noroxycodone are expected metabolites of    oxycodone. Oxymorphone is also available as a scheduled    prescription medication. ==================================================================== Test                      Result    Flag   Units      Ref Range   Creatinine              87               mg/dL      >=20 ==================================================================== Declared Medications:  The flagging and interpretation on this report are based on the  following declared medications.  Unexpected results may arise from  inaccuracies in the declared medications.  **Note: The testing scope of this panel includes these medications:  Oxycodone (Percocet)  **Note: The testing scope of this panel does not include following  reported medications:  Acetaminophen  Acetaminophen (Percocet)  Alendronate (Fosamax)  Amlodipine (Norvasc)  Besifloxacin (Besivance)  Bimatoprost (Lumigan)  Brimonidine Tartrate  Cholecalciferol  Estrogen (Premarin)  Fluticasone (Flonase)  Ketoconazole (Nizoral)  Metformin (Glucophage)  Olmesartan (Benicar)  Omega-3  Fatty Acids (Lovaza)  Pravastatin (Pravachol)  Ranitidine (Zantac)  Timolol  Vitamin D2 (Drisdol) ==================================================================== For clinical consultation, please call [phone]. ====================================================================    UDS interpretation: Compliant          Medication Assessment Form: Reviewed. Patient indicates being compliant with therapy Treatment compliance: Compliant Risk Assessment Profile: Aberrant behavior: See prior evaluations. None observed or detected today Comorbid factors increasing risk of overdose: See prior notes. No additional risks  detected today Opioid risk tool (ORT) (Total Score): 0 Personal History of Substance Abuse (SUD-Substance use disorder):  Alcohol: Negative  Illegal Drugs: Negative  Rx Drugs: Negative  ORT Risk Level calculation: Low Risk Risk of substance use disorder (SUD): Low Opioid Risk Tool - 01/21/18 1429      Family History of Substance Abuse   Alcohol  Negative    Illegal Drugs  Negative    Rx Drugs  Negative      Personal History of Substance Abuse   Alcohol  Negative    Illegal Drugs  Negative    Rx Drugs  Negative      Age   Age between 76-45 years   No      History of Preadolescent Sexual Abuse   History of Preadolescent Sexual Abuse  Negative or Female      Psychological Disease   Psychological Disease  Negative    Depression  Negative      Total Score   Opioid Risk Tool Scoring  0    Opioid Risk Interpretation  Low Risk      ORT Scoring interpretation table:  Score <3 = Low Risk for SUD  Score between 4-7 = Moderate Risk for SUD  Score >8 = High Risk for Opioid Abuse   Risk Mitigation Strategies:  Patient Counseling: Covered Patient-Prescriber Agreement (PPA): Present and active  Notification to other healthcare providers: Done  Pharmacologic Plan: No change in therapy, at this time.             Laboratory Chemistry  Inflammation Markers (CRP: Acute Phase) (ESR: Chronic Phase) No results found for: CRP, ESRSEDRATE, LATICACIDVEN                       Rheumatology Markers No results found for: RF, ANA, LABURIC, URICUR, LYMEIGGIGMAB, LYMEABIGMQN, HLAB27                      Renal Function Markers Lab Results  Component Value Date   BUN 22 08/05/2017   CREATININE 1.31 (H) 08/05/2017   BCR 17 08/05/2017   GFRAA 44 (L) 08/05/2017   GFRNONAA 38 (L) 08/05/2017                             Hepatic Function Markers Lab Results  Component Value Date   AST 8 (L) 08/05/2017   ALT 6 08/05/2017   ALBUMIN 4.0 08/14/2016   ALKPHOS 56 08/14/2016                         Electrolytes Lab Results  Component Value Date   NA 142 08/05/2017   K 4.5 08/05/2017   CL 106 08/05/2017   CALCIUM 9.7 08/05/2017                        Neuropathy Markers Lab Results  Component Value Date   HGBA1C 7.4 (A) 12/06/2017  CNS Tests No results found for: COLORCSF, APPEARCSF, RBCCOUNTCSF, WBCCSF, POLYSCSF, LYMPHSCSF, EOSCSF, PROTEINCSF, GLUCCSF, JCVIRUS, CSFOLI, IGGCSF                      Bone Pathology Markers Lab Results  Component Value Date   VD25OH 29 (L) 08/05/2017                         Coagulation Parameters Lab Results  Component Value Date   PLT 328 08/05/2017                        Cardiovascular Markers Lab Results  Component Value Date   HGB 11.2 (L) 08/05/2017   HCT 34.6 (L) 08/05/2017                         CA Markers No results found for: CEA, CA125, LABCA2                      Note: Lab results reviewed.  Recent Diagnostic Imaging Results  MM 3D SCREEN BREAST BILATERAL CLINICAL DATA:  Screening.  EXAM: DIGITAL SCREENING BILATERAL MAMMOGRAM WITH TOMO AND CAD  COMPARISON:  Previous exam(s).  ACR Breast Density Category b: There are scattered areas of fibroglandular density.  FINDINGS: There are no findings suspicious for malignancy. Images were processed with CAD.  IMPRESSION: No mammographic evidence of malignancy. A result letter of this screening mammogram will be mailed directly to the patient.  RECOMMENDATION: Screening mammogram in one year. (Code:[id number])  BI-RADS CATEGORY  1: Negative.  Electronically Signed   By: Lillia Mountain M.D.   On: 08/23/2017 12:26  Complexity Note: Imaging results reviewed. Results shared with Ms. Harner, using [employer].                         Meds   Current Outpatient Medications:  .  acetaminophen (TYLENOL) 500 MG tablet, Take 1 tablet (500 mg total) by mouth 3 (three) times daily. (Patient taking differently: Take 500 mg by mouth as needed. ),  Disp: 90 tablet, Rfl: 0 .  alendronate (FOSAMAX) 70 MG tablet, TAKE 1 TABLET BY MOUTH WEEKLY, Disp: 12 tablet, Rfl: 1 .  amLODipine (NORVASC) 10 MG tablet, TAKE 1 TABLET BY MOUTH EVERY DAY, Disp: 90 tablet, Rfl: 1 .  bimatoprost (LUMIGAN) 0.01 % SOLN, Place 1 drop into both eyes daily. , Disp: , Rfl:  .  brimonidine-timolol (COMBIGAN) 0.2-0.5 % ophthalmic solution, Place 1 drop into both eyes See admin instructions. 1 drop into both eyes in the morning, then 1 drop into both eyes 8 hours later, Disp: , Rfl:  .  Cholecalciferol (VITAMIN D-1000 MAX ST) 1000 UNITS tablet, Take 1,000 Units by mouth daily. Reported on 08/05/2015, Disp: , Rfl:  .  conjugated estrogens (PREMARIN) vaginal cream, Place 1 Applicatorful vaginally daily., Disp: 42.5 g, Rfl: 5 .  famotidine (PEPCID) 20 MG tablet, Take 1 tablet (20 mg total) by mouth daily., Disp: 90 tablet, Rfl: 0 .  fluticasone (FLONASE) 50 MCG/ACT nasal spray, SPRAY 2 SPRAYS INTO EACH NOSTRIL EVERY DAY, Disp: 16 g, Rfl: 1 .  glucose blood test strip, Use as instructed, Disp: 100 each, Rfl: 12 .  metFORMIN (GLUCOPHAGE-XR) 750 MG 24 hr tablet, Take 2 tablets (1,500 mg total) by mouth daily with breakfast., Disp: 180 tablet, Rfl: 1 .  olmesartan (BENICAR) 20  MG tablet, Take 1 tablet (20 mg total) by mouth daily., Disp: 90 tablet, Rfl: 1 .  omega-3 acid ethyl esters (LOVAZA) 1 g capsule, TAKE 1 CAPSULE (1 G TOTAL) BY MOUTH 2 (TWO) TIMES DAILY., Disp: 180 capsule, Rfl: 1 .  ONETOUCH DELICA LANCETS FINE MISC, 1 each by Does not apply route daily. 30 G Lancets- One Touch Delica, Disp: 425 each, Rfl: 1 .  oxyCODONE-acetaminophen (PERCOCET) 7.5-325 MG tablet, Take 1 tablet by mouth every 8 (eight) hours as needed for moderate pain or severe pain., Disp: 90 tablet, Rfl: 0 .  pravastatin (PRAVACHOL) 40 MG tablet, TAKE 1 TABLET BY MOUTH EVERYDAY AT BEDTIME, Disp: 90 tablet, Rfl: 1 .  [START ON 03/03/2018] oxyCODONE-acetaminophen (PERCOCET) 7.5-325 MG tablet, Take 1 tablet by  mouth every 8 (eight) hours as needed for moderate pain or severe pain., Disp: 90 tablet, Rfl: 0 .  [START ON 02/01/2018] oxyCODONE-acetaminophen (PERCOCET) 7.5-325 MG tablet, Take 1 tablet by mouth every 8 (eight) hours as needed for moderate pain or severe pain., Disp: 90 tablet, Rfl: 0  ROS  Constitutional: Denies any fever or chills Gastrointestinal: No reported hemesis, hematochezia, vomiting, or acute GI distress Musculoskeletal: Denies any acute onset joint swelling, redness, loss of ROM, or weakness Neurological: No reported episodes of acute onset apraxia, aphasia, dysarthria, agnosia, amnesia, paralysis, loss of coordination, or loss of consciousness  Allergies  Ms. Perot is allergic to ace inhibitors.  [place]  Drug: Ms. Reinhold  reports that she does not use drugs. Alcohol:  reports that she does not drink alcohol. Tobacco:  reports that she has never smoked. She has never used smokeless tobacco. Medical:  has a past medical history of Abnormal mammogram, unspecified (2013), Breast screening, unspecified (2013), Diabetes mellitus without complication ([place]) (9563), GERD (gastroesophageal reflux disease), Glaucoma (2003), Gout, Hyperlipidemia (2008), Hypertension (1980's), Lump or mass in breast (01/03/2012), Obesity, unspecified (2013), Osteoporosis, Shingles (2013), and Special screening for malignant neoplasms, colon (2013). Surgical: Ms. Mcgurk  has a past surgical history that includes Abdominal hysterectomy (1958); Knee surgery (2009,2011,2012); Lipoma excision (1998); Spine surgery (2004); Tonsillectomy; Colonoscopy (2003); Coronary artery bypass graft (Left, 01/03/2012,01/24/2012); Cataract extraction, bilateral (Bilateral); Cholecystectomy; Back surgery; Knee surgery; and Breast biopsy (Left, 01/25/2012). Family: family history includes Cancer in her mother; Heart disease in her father; Stroke in her mother.  Constitutional Exam  General appearance: Well nourished, well  developed, and well hydrated. In no apparent acute distress Vitals:   01/21/18 1417  BP: (!) 141/61  Resp: 16  Temp: 97.7 F (36.5 C)  SpO2: 99%  Weight: 212 lb (96.2 kg)  Height: 5' 1.5" (1.562 m)  Psych/Mental status: Alert, oriented x 3 (person, place, & time)       Eyes: PERLA Respiratory: No evidence of acute respiratory distress   Upper Extremity (UE) Exam    Side: Right upper extremity  Side: Left upper extremity  Skin & Extremity Inspection: Heberden's nodes (DIP)  Skin & Extremity Inspection: Heberden's nodes (DIP)  Functional ROM: Unrestricted ROM          Functional ROM: Unrestricted ROM          Muscle Tone/Strength: Functionally intact. No obvious neuro-muscular anomalies detected.  Muscle Tone/Strength: Functionally intact. No obvious neuro-muscular anomalies detected.  Sensory (Neurological): Unimpaired          Sensory (Neurological): Unimpaired          Palpation: No palpable anomalies              Palpation: No  palpable anomalies               Lumbar Spine Area Exam  Skin & Axial Inspection: No masses, redness, or swelling Alignment: Symmetrical Functional ROM: Unrestricted ROM       Stability: No instability detected Muscle Tone/Strength: Functionally intact. No obvious neuro-muscular anomalies detected. Sensory (Neurological): Unimpaired Palpation: No palpable anomalies         Gait & Posture Assessment  Ambulation: Patient ambulates using a walker Gait: Limited. Using assistive device to ambulate Posture: WNL   Lower Extremity Exam    Side: Right lower extremity  Side: Left lower extremity  Stability: No instability observed          Stability: No instability observed          Skin & Extremity Inspection: Evidence of prior arthroplastic surgery  Skin & Extremity Inspection: Skin color, temperature, and hair growth are WNL. No peripheral edema or cyanosis. No masses, redness, swelling, asymmetry, or associated skin lesions. No contractures.  Functional  ROM: Unrestricted ROM                  Functional ROM: Unrestricted ROM                  Muscle Tone/Strength: Functionally intact. No obvious neuro-muscular anomalies detected.  Muscle Tone/Strength: Functionally intact. No obvious neuro-muscular anomalies detected.  Sensory (Neurological): Unimpaired        Sensory (Neurological): Unimpaired            Palpation: No palpable anomalies  Palpation: No palpable anomalies   Assessment  Primary Diagnosis & Pertinent Problem List: The primary encounter diagnosis was Osteoarthritis of multiple joints, unspecified osteoarthritis type. Diagnoses of Chronic pain of right knee, Pain in both hands, Chronic midline low back pain without sciatica, and Chronic pain syndrome were also pertinent to this visit.  Status Diagnosis  Controlled Controlled Controlled 1. Osteoarthritis of multiple joints, unspecified osteoarthritis type   2. Chronic pain of right knee   3. Pain in both hands   4. Chronic midline low back pain without sciatica   5. Chronic pain syndrome     Problems updated and reviewed during this visit: No problems updated. Plan of Care  Pharmacotherapy (Medications Ordered): Meds ordered this encounter  Medications  . oxyCODONE-acetaminophen (PERCOCET) 7.5-325 MG tablet    Sig: Take 1 tablet by mouth every 8 (eight) hours as needed for moderate pain or severe pain.    Dispense:  90 tablet    Refill:  0    Do not place this medication, or any other prescription from our practice, on "Automatic Refill". Patient may have prescription filled one day early if pharmacy is closed on scheduled refill date.    Order Specific Question:   Supervising Provider    Answer:   Milinda Pointer [phone]  . oxyCODONE-acetaminophen (PERCOCET) 7.5-325 MG tablet    Sig: Take 1 tablet by mouth every 8 (eight) hours as needed for moderate pain or severe pain.    Dispense:  90 tablet    Refill:  0    Do not place this medication, or any other  prescription from our practice, on "Automatic Refill". Patient may have prescription filled one day early if pharmacy is closed on scheduled refill date.    Order Specific Question:   Supervising Provider    Answer:   Milinda Pointer [[id number]]   New Prescriptions   No medications on file   Medications administered today: [name] had  no medications administered during this visit. Lab-work, procedure(s), and/or referral(s): No orders of the defined types were placed in this encounter.  Imaging and/or referral(s): None  Interventional therapies: Planned, scheduled, and/or pending:   Not at this time.    Provider-requested follow-up: Return in about 2 months (around 03/24/2018) for MedMgmt.  Future Appointments  Date Time Provider [place]  03/27/2018  1:45 PM Vevelyn Francois, NP [facility] None  04/08/2018 10:40 AM [name], MD [facility]  06/05/2018  1:45 PM Hayden Pedro, MD [facility] None   Primary Care Physician: [name], MD Location: [facility] Note by: Vevelyn Francois NP Date: 01/21/2018; Time: 3:26 PM  Pain Score Disclaimer: We use the NRS-11 scale. This is a self-reported, subjective measurement of pain severity with only modest accuracy. It is used primarily to identify changes within a particular patient. It must be understood that outpatient pain scales are significantly less accurate that those used for research, where they can be applied under ideal controlled circumstances with minimal exposure to variables. In reality, the score is likely to be a combination of pain intensity and pain affect, where pain affect describes the degree of emotional arousal or changes in action readiness caused by the sensory experience of pain. Factors such as social and work situation, setting, emotional state, anxiety levels, expectation, and prior pain experience may influence pain perception and show large inter-individual  differences that may also be affected by time variables.  Patient instructions provided during this appointment: Patient Instructions   ____________________________________________________________________________________________  Medication Rules  Purpose: To inform patients, and their family members, of our rules and regulations.  Applies to: All patients receiving prescriptions (written or electronic).  Pharmacy of record: Pharmacy where electronic prescriptions will be sent. If written prescriptions are taken to a different pharmacy, please inform the nursing staff. The pharmacy listed in the electronic medical record should be the one where you would like electronic prescriptions to be sent.  Electronic prescriptions: In compliance with the [place] (STOP) Act of 2017 (Session Lanny Cramp [phone]), effective February 19, 2018, all controlled substances must be electronically prescribed. Calling prescriptions to the pharmacy will cease to exist.  Prescription refills: Only during scheduled appointments. Applies to all prescriptions.  NOTE: The following applies primarily to controlled substances (Opioid* Pain Medications).   Patient's responsibilities: 1. Pain Pills: Bring all pain pills to every appointment (except for procedure appointments). 2. Pill Bottles: Bring pills in original pharmacy bottle. Always bring the newest bottle. Bring bottle, even if empty. 3. Medication refills: You are responsible for knowing and keeping track of what medications you take and those you need refilled. The day before your appointment: write a list of all prescriptions that need to be refilled. The day of the appointment: give the list to the admitting nurse. Prescriptions will be written only during appointments. If you forget a medication: it will not be "Called in", "Faxed", or "electronically sent". You will need to get another appointment to get these  prescribed. No early refills. Do not call asking to have your prescription filled early. 4. Prescription Accuracy: You are responsible for carefully inspecting your prescriptions before leaving our office. Have the discharge nurse carefully go over each prescription with you, before taking them home. Make sure that your name is accurately spelled, that your address is correct. Check the name and dose of your medication to make sure it is accurate. Check the number of pills, and the written  instructions to make sure they are clear and accurate. Make sure that you are given enough medication to last until your next medication refill appointment. 5. Taking Medication: Take medication as prescribed. When it comes to controlled substances, taking less pills or less frequently than prescribed is permitted and encouraged. Never take more pills than instructed. Never take medication more frequently than prescribed.  6. Inform other Doctors: Always inform, all of your healthcare providers, of all the medications you take. 7. Pain Medication from other Providers: You are not allowed to accept any additional pain medication from any other Doctor or Healthcare provider. There are two exceptions to this rule. (see below) In the event that you require additional pain medication, you are responsible for notifying us, as stated below. 8. Medication Agreement: You are responsible for carefully reading and following our Medication Agreement. This must be signed before receiving any prescriptions from our practice. Safely store a copy of your signed Agreement. Violations to the Agreement will result in no further prescriptions. (Additional copies of our Medication Agreement are available upon request.) 9. Laws, Rules, & Regulations: All patients are expected to follow all Federal and [employer], [employer], Rules, [employer]. Ignorance of the Laws does not constitute a valid excuse. The use of any illegal substances is  prohibited. 10. Adopted CDC guidelines & recommendations: Target dosing levels will be at or below 60 MME/day. Use of benzodiazepines** is not recommended.  Exceptions: There are only two exceptions to the rule of not receiving pain medications from other Healthcare Providers. 1. Exception #1 (Emergencies): In the event of an emergency (i.e.: accident requiring emergency care), you are allowed to receive additional pain medication. However, you are responsible for: As soon as you are able, call our office [phone], at any time of the day or night, and leave a message stating your name, the date and nature of the emergency, and the name and dose of the medication prescribed. In the event that your call is answered by a member of our staff, make sure to document and save the date, time, and the name of the person that took your information.  2. Exception #2 (Planned Surgery): In the event that you are scheduled by another doctor or dentist to have any type of surgery or procedure, you are allowed (for a period no longer than 30 days), to receive additional pain medication, for the acute post-op pain. However, in this case, you are responsible for picking up a copy of our "Post-op Pain Management for Surgeons" handout, and giving it to your surgeon or dentist. This document is available at our office, and does not require an appointment to obtain it. Simply go to our office during business hours (Monday-Thursday from 8:00 AM to 4:00 PM) (Friday 8:00 AM to 12:00 Noon) or if you have a scheduled appointment with [place], prior to your surgery, and ask for it by name. In addition, you will need to provide [place] with your name, name of your surgeon, type of surgery, and date of procedure or surgery.  *Opioid medications include: morphine, codeine, oxycodone, oxymorphone, hydrocodone, hydromorphone, meperidine, tramadol, tapentadol, buprenorphine, fentanyl, methadone. **Benzodiazepine medications include: diazepam  (Valium), alprazolam (Xanax), clonazepam (Klonopine), lorazepam (Ativan), clorazepate (Tranxene), chlordiazepoxide (Librium), estazolam (Prosom), oxazepam (Serax), temazepam (Restoril), triazolam (Halcion) (Last updated: 04/18/2017) ____________________________________________________________________________________________    BMI Assessment: Estimated body mass index is 39.41 kg/m as calculated from the following:   Height as of this encounter: 5' 1.5" (1.562 m).   Weight as of this  encounter: 212 lb (96.2 kg).  BMI interpretation table: BMI level Category Range association with higher incidence of chronic pain  <18 kg/m2 Underweight   18.5-24.9 kg/m2 Ideal body weight   25-29.9 kg/m2 Overweight Increased incidence by 20%  30-34.9 kg/m2 Obese (Class I) Increased incidence by 68%  35-39.9 kg/m2 Severe obesity (Class II) Increased incidence by 136%  >40 kg/m2 Extreme obesity (Class III) Increased incidence by 254%   Patient's current BMI Ideal Body weight  Body mass index is 39.41 kg/m. Ideal body weight: 48.9 kg (107 lb 14.6 oz) Adjusted ideal body weight: 67.8 kg (149 lb 8.8 oz)   BMI Readings from Last 4 Encounters:  01/21/18 39.41 kg/m  12/06/17 38.48 kg/m  11/19/17 39.41 kg/m  09/23/17 40.06 kg/m   Wt Readings from Last 4 Encounters:  01/21/18 212 lb (96.2 kg)  12/06/17 210 lb 6.4 oz (95.4 kg)  11/19/17 212 lb (96.2 kg)  09/23/17 212 lb (96.2 kg)    Medication 02/01/18 and 03/03/2018

## 2018-01-26 ENCOUNTER — Other Ambulatory Visit: Payer: Self-pay | Admitting: Family Medicine

## 2018-01-26 DIAGNOSIS — E1129 Type 2 diabetes mellitus with other diabetic kidney complication: Secondary | ICD-10-CM

## 2018-01-26 DIAGNOSIS — R809 Proteinuria, unspecified: Principal | ICD-10-CM

## 2018-01-27 ENCOUNTER — Encounter: Payer: Self-pay | Admitting: Nurse Practitioner

## 2018-01-27 ENCOUNTER — Ambulatory Visit
Admission: RE | Admit: 2018-01-27 | Discharge: 2018-01-27 | Disposition: A | Discharge status: Home / Self Care | Payer: Medicare Other | Source: Ambulatory Visit | Attending: Nurse Practitioner | Admitting: Nurse Practitioner

## 2018-01-27 ENCOUNTER — Ambulatory Visit (INDEPENDENT_AMBULATORY_CARE_PROVIDER_SITE_OTHER): Payer: Medicare Other | Admitting: Nurse Practitioner

## 2018-01-27 VITALS — BP 130/64 | HR 76 | Temp 98.0°F | Resp 16 | Ht 62.0 in | Wt 209.9 lb

## 2018-01-27 DIAGNOSIS — R059 Cough, unspecified: Secondary | ICD-10-CM

## 2018-01-27 DIAGNOSIS — R062 Wheezing: Secondary | ICD-10-CM | POA: Diagnosis not present

## 2018-01-27 DIAGNOSIS — J040 Acute laryngitis: Secondary | ICD-10-CM | POA: Diagnosis not present

## 2018-01-27 DIAGNOSIS — R05 Cough: Secondary | ICD-10-CM | POA: Diagnosis not present

## 2018-01-27 MED ORDER — MAGIC MOUTHWASH: 5.0000 mL | Freq: Two times a day (BID) | ORAL | 0 refills | Status: DC | PRN

## 2018-01-27 MED ORDER — BENZONATATE 200 MG PO CAPS: 200.0000 mg | ORAL_CAPSULE | Freq: Two times a day (BID) | ORAL | 0 refills | Status: DC | PRN

## 2018-01-27 NOTE — Progress Notes (Signed)
Name: Taylor [name]   MRN: [id number]    DOB: [birth year]   Date:01/27/2018       Progress Note  Subjective  Chief Complaint  Chief Complaint  Patient presents with  . Sore Throat    woke up couldn't talk on Thursday. Gradually worsening.  [name] Hoarse    HPI  Patient work up Thursday with hoarseness- no sore throat, dry cough and rhinorrhea. Denies choking sensation, dysphagia. Notes some intermittent chills.    Lab Results  Component Value Date   HGBA1C 7.4 (A) 12/06/2017  considered steroid treatment, less likely due to A1C   Patient Active Problem List   Diagnosis Date Noted  . Age-related osteoporosis without current pathological fracture 09/26/2017  . Pain in both hands 09/23/2017  . Hyperparathyroidism ([place]) 08/06/2017  . Chronic kidney disease, stage III (moderate) ([place]) 08/06/2017  . HLD (hyperlipidemia) 07/25/2017  . Osteoporotic compression fracture of spine ([place]) 03/22/2017  . Assistance needed with transportation 03/21/2017  . Iron deficiency anemia 12/19/2016  . Aortic atherosclerosis ([place]) 11/23/2016  . Anterolisthesis 11/23/2016  . Chronic pain syndrome 09/13/2016  . Vitamin D deficiency 08/19/2016  . Anemia, unspecified 08/19/2016  . Primary open angle glaucoma (POAG) of both eyes, severe stage 08/17/2016  . Hypertensive retinopathy 08/17/2016  . Long term current use of opiate analgesic 07/19/2016  . Dyslipidemia associated with type 2 diabetes mellitus ([place]) 04/16/2016  . Chronic midline low back pain without sciatica 04/16/2016  . Chronic pain of right knee 04/16/2016  . Vaginal dryness 02/22/2015  . Calculus of kidney 11/10/2014  . Osteoarthritis, multiple sites 09/09/2014  . Type 2 diabetes mellitus with microalbuminuria ([place]) 09/09/2014  . Glaucoma 09/09/2014  . BP (high blood pressure) 09/09/2014  . Chronic pain 09/09/2014  . Tinea corporis 09/09/2014  . Umbilical hernia 33/82/5053  . Lump or mass in breast     Past Medical History:   Diagnosis Date  . Abnormal mammogram, unspecified 2013  . Breast screening, unspecified 2013  . Diabetes mellitus without complication ([place]) 9767   non insulin dependent  . GERD (gastroesophageal reflux disease)   . Glaucoma 2003  . Gout   . Hyperlipidemia 2008  . Hypertension 1980's  . Lump or mass in breast 01/03/2012   left breast  . Obesity, unspecified 2013  . Osteoporosis   . Shingles 2013  . Special screening for malignant neoplasms, colon 2013    Past Surgical History:  Procedure Laterality Date  . ABDOMINAL HYSTERECTOMY  1958   menorrhagia  . BACK SURGERY    . BREAST BIOPSY Left 01/25/2012  . CATARACT EXTRACTION, BILATERAL Bilateral    1st 06/07/15 and the 2nd 06/21/15  . CHOLECYSTECTOMY    . COLONOSCOPY  2003   [facility]  . CORONARY ARTERY BYPASS GRAFT Left 01/03/2012,01/24/2012   Left FNA, Left breast [facility] bx  . KNEE SURGERY  2009,2011,2012   twice on right and once on left  . KNEE SURGERY    . LIPOMA EXCISION  1998  . [place]  2004  . TONSILLECTOMY     age of 24    Social History   Tobacco Use  . Smoking status: Never Smoker  . Smokeless tobacco: Never Used  Substance Use Topics  . Alcohol use: No    Alcohol/week: 0.0 standard drinks     Current Outpatient Medications:  .  acetaminophen (TYLENOL) 500 MG tablet, Take 1 tablet (500 mg total) by mouth 3 (three) times daily. (Patient taking differently: Take 500 mg by mouth  as needed. ), Disp: 90 tablet, Rfl: 0 .  alendronate (FOSAMAX) 70 MG tablet, TAKE 1 TABLET BY MOUTH WEEKLY, Disp: 12 tablet, Rfl: 1 .  amLODipine (NORVASC) 10 MG tablet, TAKE 1 TABLET BY MOUTH EVERY DAY, Disp: 90 tablet, Rfl: 1 .  bimatoprost (LUMIGAN) 0.01 % SOLN, Place 1 drop into both eyes daily. , Disp: , Rfl:  .  brimonidine-timolol (COMBIGAN) 0.2-0.5 % ophthalmic solution, Place 1 drop into both eyes See admin instructions. 1 drop into both eyes in the morning, then 1 drop into both eyes 8 hours later, Disp: , Rfl:  .   Cholecalciferol (VITAMIN D-1000 MAX ST) 1000 UNITS tablet, Take 1,000 Units by mouth daily. Reported on 08/05/2015, Disp: , Rfl:  .  conjugated estrogens (PREMARIN) vaginal cream, Place 1 Applicatorful vaginally daily., Disp: 42.5 g, Rfl: 5 .  famotidine (PEPCID) 20 MG tablet, Take 1 tablet (20 mg total) by mouth daily., Disp: 90 tablet, Rfl: 0 .  fluticasone (FLONASE) 50 MCG/ACT nasal spray, SPRAY 2 SPRAYS INTO EACH NOSTRIL EVERY DAY, Disp: 16 g, Rfl: 1 .  glucose blood test strip, Use as instructed, Disp: 100 each, Rfl: 12 .  metFORMIN (GLUCOPHAGE-XR) 750 MG 24 hr tablet, TAKE 2 TABLETS (1,500 MG TOTAL) BY MOUTH DAILY WITH BREAKFAST., Disp: 180 tablet, Rfl: 1 .  olmesartan (BENICAR) 20 MG tablet, Take 1 tablet (20 mg total) by mouth daily., Disp: 90 tablet, Rfl: 1 .  omega-3 acid ethyl esters (LOVAZA) 1 g capsule, TAKE 1 CAPSULE (1 G TOTAL) BY MOUTH 2 (TWO) TIMES DAILY., Disp: 180 capsule, Rfl: 1 .  ONETOUCH DELICA LANCETS FINE MISC, 1 each by Does not apply route daily. 30 G Lancets- One Touch Delica, Disp: 710 each, Rfl: 1 .  oxyCODONE-acetaminophen (PERCOCET) 7.5-325 MG tablet, Take 1 tablet by mouth every 8 (eight) hours as needed for moderate pain or severe pain., Disp: 90 tablet, Rfl: 0 .  [START ON 03/03/2018] oxyCODONE-acetaminophen (PERCOCET) 7.5-325 MG tablet, Take 1 tablet by mouth every 8 (eight) hours as needed for moderate pain or severe pain., Disp: 90 tablet, Rfl: 0 .  [START ON 02/01/2018] oxyCODONE-acetaminophen (PERCOCET) 7.5-325 MG tablet, Take 1 tablet by mouth every 8 (eight) hours as needed for moderate pain or severe pain., Disp: 90 tablet, Rfl: 0 .  pravastatin (PRAVACHOL) 40 MG tablet, TAKE 1 TABLET BY MOUTH EVERYDAY AT BEDTIME, Disp: 90 tablet, Rfl: 1  Allergies  Allergen Reactions  . Ace Inhibitors     cough    ROS   No other specific complaints in a complete review of systems (except as listed in HPI above).  Objective  Vitals:   01/27/18 1113 01/27/18 1114   BP:  130/64  Pulse:  76  Resp:  16  Temp:  98 F (36.7 C)  TempSrc:  Oral  SpO2:  100%  Weight:  209 lb 14.4 oz (95.2 kg)  Height: 5\' 2"  (1.575 m) 5\' 2"  (1.575 m)    Body mass index is 38.39 kg/m.  Nursing Note and Vital Signs reviewed.  Physical Exam  Constitutional: She is oriented to person, place, and time. She appears well-developed and well-nourished.  HENT:  Head: Normocephalic and atraumatic.  Right Ear: Hearing, tympanic membrane and ear canal normal.  Left Ear: Hearing, tympanic membrane and ear canal normal.  Nose: Mucosal edema present. Right sinus exhibits no maxillary sinus tenderness and no frontal sinus tenderness. Left sinus exhibits no maxillary sinus tenderness and no frontal sinus tenderness.  Mouth/Throat: Mucous membranes are normal.  Mucous membranes are not dry. Posterior oropharyngeal erythema present. No oropharyngeal exudate or posterior oropharyngeal edema. No tonsillar exudate.  Cardiovascular: Normal rate and normal heart sounds.  Pulmonary/Chest: Effort normal. No accessory muscle usage or stridor. No respiratory distress. She has decreased breath sounds (bilaterally throughout). She has wheezes in the left lower field.  Lymphadenopathy:    She has no cervical adenopathy.  Neurological: She is alert and oriented to person, place, and time.  Skin: Skin is warm and dry. No rash noted.  Psychiatric: She has a normal mood and affect. Her behavior is normal.    No results found for this or any previous visit (from the past 48 hour(s)).  Assessment & Plan  1. Wheezing Possible restriction from phlegm- some improvement with coughing  - DG Chest 2 View; Future  2. Cough - DG Chest 2 View; Future - benzonatate (TESSALON) 200 MG capsule; Take 1 capsule (200 mg total) by mouth 2 (two) times daily as needed for cough.  Dispense: 30 capsule; Refill: 0  3. Acute laryngitis Discussed course  - magic mouthwash SOLN; Take 5 mLs by mouth 2 (two) times  daily as needed for mouth pain.  Dispense: 50 mL; Refill: 0  -Please go across the street to the imaging center to get a chest xray right a way.  - Continue drinking hot lemon tea - Please take musinex or guaifenesin 600mg  twice a day for the next few days to help you cough up phelgm.  -  Use humidifier to soothe oral and nasal passage ways - Can use magic mouth wash up to three times a day- swish in mouth and spit - Can use cough medicine twice a day  - laryngitis is a common side effect of the common cold; if your symptoms are not improving in the next few days or at any point are worsening let us know. You may have a hoarse voice for up to 2-3 weeks but it should be gradually improving in the next few days.

## 2018-01-27 NOTE — Patient Instructions (Addendum)
-  Please go across the street to the imaging center to get a chest xray right a way.  - Continue drinking hot lemon tea - Please take musinex or guaifenesin 600mg  twice a day for the next few days to help you cough up phelgm.  -  Use humidifier to soothe oral and nasal passage ways - Can use magic mouth wash up to three times a day- swish in mouth and spit - Can use cough medicine twice a day  - laryngitis is a common side effect of the common cold; if your symptoms are not improving in the next few days or at any point are worsening let us know. You may have a hoarse voice for up to 2-3 weeks but it should be gradually improving in the next few days.  Laryngitis Laryngitis is swelling (inflammation) of your vocal cords. This causes hoarseness, coughing, loss of voice, sore throat, or a dry throat. When your vocal cords are inflamed, your voice sounds different. Laryngitis can be temporary (acute) or long-term (chronic). Most cases of acute laryngitis improve with time. Chronic laryngitis is laryngitis that lasts for more than three weeks. Follow these instructions at home:  Drink enough fluid to keep your pee (urine) clear or pale yellow.  Breathe in moist air. Use a humidifier if you live in a dry climate.  Take medicines only as told by your doctor.  Do not smoke cigarettes or electronic cigarettes. If you need help quitting, ask your doctor.  Talk as little as possible. Also avoid whispering, which can cause vocal strain.  Write instead of talking. Do this until your voice is back to normal. Contact a doctor if:  You have a fever.  Your pain is worse.  You have trouble swallowing. Get help right away if:  You cough up blood.  You have trouble breathing. This information is not intended to replace advice given to you by your health care provider. Make sure you discuss any questions you have with your health care provider. Document Released: 01/25/2011 Document Revised:  07/14/2015 Document Reviewed: 07/21/2013 Elsevier Interactive Patient Education  [employer].

## 2018-01-31 ENCOUNTER — Ambulatory Visit (INDEPENDENT_AMBULATORY_CARE_PROVIDER_SITE_OTHER): Payer: Medicare Other | Admitting: Family Medicine

## 2018-01-31 ENCOUNTER — Encounter: Payer: Self-pay | Admitting: Family Medicine

## 2018-01-31 VITALS — BP 128/72 | HR 93 | Temp 98.0°F | Resp 16 | Ht 62.0 in | Wt 207.4 lb

## 2018-01-31 DIAGNOSIS — I10 Essential (primary) hypertension: Secondary | ICD-10-CM

## 2018-01-31 DIAGNOSIS — M810 Age-related osteoporosis without current pathological fracture: Secondary | ICD-10-CM | POA: Diagnosis not present

## 2018-01-31 DIAGNOSIS — J069 Acute upper respiratory infection, unspecified: Secondary | ICD-10-CM

## 2018-01-31 DIAGNOSIS — E1169 Type 2 diabetes mellitus with other specified complication: Secondary | ICD-10-CM | POA: Diagnosis not present

## 2018-01-31 DIAGNOSIS — Z974 Presence of external hearing-aid: Secondary | ICD-10-CM | POA: Diagnosis not present

## 2018-01-31 DIAGNOSIS — D649 Anemia, unspecified: Secondary | ICD-10-CM

## 2018-01-31 DIAGNOSIS — E785 Hyperlipidemia, unspecified: Secondary | ICD-10-CM

## 2018-01-31 DIAGNOSIS — R790 Abnormal level of blood mineral: Secondary | ICD-10-CM

## 2018-01-31 MED ORDER — FERROUS SULFATE 325 (65 FE) MG PO TABS: 325.0000 mg | ORAL_TABLET | Freq: Every day | ORAL | 3 refills | Status: DC

## 2018-01-31 MED ORDER — OLMESARTAN MEDOXOMIL 20 MG PO TABS: 20.0000 mg | ORAL_TABLET | Freq: Every day | ORAL | 1 refills | Status: DC

## 2018-01-31 NOTE — Progress Notes (Signed)
Name: Taylor [name]   MRN: [id number]    DOB: 12/28/1932   Date:01/31/2018       Progress Note  Subjective  Chief Complaint  Chief Complaint  Patient presents with  . Follow-up    4 day follow up   . Wheezing  . Cough    Has improved with Cough medication and states she feels better     HPI  URI: she came in today for 4 day follow up, she is feeling better, no longer wheezing, no fever, sore throat resolved, still has a dry cough but better with tessalon perles  HTN taking medication and bp is at goal, needs refill of Benicar, no chest pain or palpitation  Osteoporosis: seen by Dr. Honor Junes, now on Prolia and may stop fosamax. No side effects of medication   DM: doing well, last hgbA1C and states glucose in the 150's fasting and not changing since she got sick   Hearing loss: she has hearing loss, but she did not wear it today.   Iron deficiency : low ferritin, but negative hemoccult stools, discussed high iron diet and iron supplementation   Patient Active Problem List   Diagnosis Date Noted  . Age-related osteoporosis without current pathological fracture 09/26/2017  . Pain in both hands 09/23/2017  . Hyperparathyroidism ([place]) 08/06/2017  . Chronic kidney disease, stage III (moderate) ([place]) 08/06/2017  . HLD (hyperlipidemia) 07/25/2017  . Osteoporotic compression fracture of spine ([place]) 03/22/2017  . Assistance needed with transportation 03/21/2017  . Iron deficiency anemia 12/19/2016  . Aortic atherosclerosis ([place]) 11/23/2016  . Anterolisthesis 11/23/2016  . Chronic pain syndrome 09/13/2016  . Vitamin D deficiency 08/19/2016  . Anemia, unspecified 08/19/2016  . Primary open angle glaucoma (POAG) of both eyes, severe stage 08/17/2016  . Hypertensive retinopathy 08/17/2016  . Long term current use of opiate analgesic 07/19/2016  . Dyslipidemia associated with type 2 diabetes mellitus ([place]) 04/16/2016  . Chronic midline low back pain without sciatica 04/16/2016  .  Chronic pain of right knee 04/16/2016  . Vaginal dryness 02/22/2015  . Calculus of kidney 11/10/2014  . Osteoarthritis, multiple sites 09/09/2014  . Type 2 diabetes mellitus with microalbuminuria ([place]) 09/09/2014  . Glaucoma 09/09/2014  . BP (high blood pressure) 09/09/2014  . Chronic pain 09/09/2014  . Tinea corporis 09/09/2014  . Umbilical hernia 98/33/8250  . Lump or mass in breast     Past Surgical History:  Procedure Laterality Date  . ABDOMINAL HYSTERECTOMY  1958   menorrhagia  . BACK SURGERY    . BREAST BIOPSY Left 01/25/2012  . CATARACT EXTRACTION, BILATERAL Bilateral    1st 06/07/15 and the 2nd 06/21/15  . CHOLECYSTECTOMY    . COLONOSCOPY  2003   [facility]  . CORONARY ARTERY BYPASS GRAFT Left 01/03/2012,01/24/2012   Left FNA, Left breast [facility] bx  . KNEE SURGERY  2009,2011,2012   twice on right and once on left  . KNEE SURGERY    . LIPOMA EXCISION  1998  . [place]  2004  . TONSILLECTOMY     age of 85    Family History  Problem Relation Age of Onset  . Cancer Mother        skin cancer  . Stroke Mother   . Heart disease Father   . Breast cancer Neg Hx     Social History   Socioeconomic History  . Marital status: Divorced    Spouse name: Not on file  . Number of children: 4  . Years  of education: Not on file  . Highest education level: Not on file  Occupational History  . 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 Financial resource strain: Somewhat hard  . Food insecurity:    Worry: Never true    Inability: Never true  . Transportation needs:    Medical: Yes    Non-medical: Yes  Tobacco Use  . Smoking status: Never Smoker  . Smokeless tobacco: Never Used  Substance and Sexual Activity  . Alcohol use: No    Alcohol/week: 0.0 standard drinks  . Drug use: No  . Sexual activity: Never  Lifestyle  . Physical activity:    Days per week: 0 days    Minutes per session: 0 min  . Stress: Not at all   Relationships  . Social connections:    Talks on phone: More than three times a week    Gets together: Twice a week    Attends religious service: More than 4 times per year    Active member of club or organization: Yes    Attends meetings of clubs or organizations: More than 4 times per year    Relationship status: Divorced  . Intimate partner violence:    Fear of current or ex partner: No    Emotionally abused: No    Physically abused: No    Forced sexual activity: No  Other Topics Concern  . Not on file  Social History Narrative   Lives alone   Children do not live in town   Friend helps out: Cira Rue - [phone]   Independent prior to her fall 10/2016     Current Outpatient Medications:  .  acetaminophen (TYLENOL) 500 MG tablet, Take 1 tablet (500 mg total) by mouth 3 (three) times daily. (Patient taking differently: Take 500 mg by mouth as needed. ), Disp: 90 tablet, Rfl: 0 .  alendronate (FOSAMAX) 70 MG tablet, TAKE 1 TABLET BY MOUTH WEEKLY, Disp: 12 tablet, Rfl: 1 .  amLODipine (NORVASC) 10 MG tablet, TAKE 1 TABLET BY MOUTH EVERY DAY, Disp: 90 tablet, Rfl: 1 .  benzonatate (TESSALON) 200 MG capsule, Take 1 capsule (200 mg total) by mouth 2 (two) times daily as needed for cough., Disp: 30 capsule, Rfl: 0 .  bimatoprost (LUMIGAN) 0.01 % SOLN, Place 1 drop into both eyes daily. , Disp: , Rfl:  .  brimonidine-timolol (COMBIGAN) 0.2-0.5 % ophthalmic solution, Place 1 drop into both eyes See admin instructions. 1 drop into both eyes in the morning, then 1 drop into both eyes 8 hours later, Disp: , Rfl:  .  Cholecalciferol (VITAMIN D-1000 MAX ST) 1000 UNITS tablet, Take 1,000 Units by mouth daily. Reported on 08/05/2015, Disp: , Rfl:  .  conjugated estrogens (PREMARIN) vaginal cream, Place 1 Applicatorful vaginally daily., Disp: 42.5 g, Rfl: 5 .  famotidine (PEPCID) 20 MG tablet, Take 1 tablet (20 mg total) by mouth daily., Disp: 90 tablet, Rfl: 0 .  fluticasone (FLONASE)  50 MCG/ACT nasal spray, SPRAY 2 SPRAYS INTO EACH NOSTRIL EVERY DAY, Disp: 16 g, Rfl: 1 .  glucose blood test strip, Use as instructed, Disp: 100 each, Rfl: 12 .  magic mouthwash SOLN, Take 5 mLs by mouth 2 (two) times daily as needed for mouth pain., Disp: 50 mL, Rfl: 0 .  metFORMIN (GLUCOPHAGE-XR) 750 MG 24 hr tablet, TAKE 2 TABLETS (1,500 MG TOTAL) BY MOUTH DAILY WITH BREAKFAST., Disp: 180 tablet, Rfl: 1 .  olmesartan (BENICAR) 20 MG tablet, Take 1 tablet (20 mg total) by mouth daily., Disp: 90 tablet, Rfl: 1 .  omega-3 acid ethyl esters (LOVAZA) 1 g capsule, TAKE 1 CAPSULE (1 G TOTAL) BY MOUTH 2 (TWO) TIMES DAILY., Disp: 180 capsule, Rfl: 1 .  ONETOUCH DELICA LANCETS FINE MISC, 1 each by Does not apply route daily. 30 G Lancets- One Touch Delica, Disp: 481 each, Rfl: 1 .  oxyCODONE-acetaminophen (PERCOCET) 7.5-325 MG tablet, Take 1 tablet by mouth every 8 (eight) hours as needed for moderate pain or severe pain., Disp: 90 tablet, Rfl: 0 .  [START ON 03/03/2018] oxyCODONE-acetaminophen (PERCOCET) 7.5-325 MG tablet, Take 1 tablet by mouth every 8 (eight) hours as needed for moderate pain or severe pain., Disp: 90 tablet, Rfl: 0 .  [START ON 02/01/2018] oxyCODONE-acetaminophen (PERCOCET) 7.5-325 MG tablet, Take 1 tablet by mouth every 8 (eight) hours as needed for moderate pain or severe pain., Disp: 90 tablet, Rfl: 0 .  pravastatin (PRAVACHOL) 40 MG tablet, TAKE 1 TABLET BY MOUTH EVERYDAY AT BEDTIME, Disp: 90 tablet, Rfl: 1  Allergies  Allergen Reactions  . Ace Inhibitors     cough    I personally reviewed active problem list, medication list, allergies, family history, social history with the patient/caregiver today.   ROS  Constitutional: Negative for fever or weight change.  Respiratory: Positive  for cough but no  shortness of breath.   Cardiovascular: Negative for chest pain or palpitations.  Gastrointestinal: Negative for abdominal pain, no bowel changes.  Musculoskeletal: Positive  for gait problem or joint swelling.  Skin: Negative for rash.  Neurological: Negative for dizziness or headache.  No other specific complaints in a complete review of systems (except as listed in HPI above).  Objective  Vitals:   01/31/18 1057  BP: 128/72  Pulse: 93  Resp: 16  Temp: 98 F (36.7 C)  TempSrc: Oral  SpO2: 99%  Weight: 207 lb 6.4 oz (94.1 kg)  Height: 5\' 2"  (1.575 m)    Body mass index is 37.93 kg/m.  Physical Exam  Constitutional: Patient appears well-developed and well-nourished. Obese  No distress.  HEENT: head atraumatic, normocephalic, pupils equal and reactive to light,neck supple, throat within normal limits Cardiovascular: Normal rate, regular rhythm and normal heart sounds.  Systolic ejection  murmur heard 2/6 . No BLE edema. Pulmonary/Chest: Effort normal and breath sounds normal. No respiratory distress. Abdominal: Soft.  There is no tenderness. Muscular Skeletal: uses walker, decrease rom of right knee  Psychiatric: Patient has a normal mood and affect. behavior is normal. Judgment and thought content normal.  Recent Results (from the past 2160 hour(s))  POCT HgB A1C     Status: Abnormal   Collection Time: 12/06/17 12:24 PM  Result Value Ref Range   Hemoglobin A1C 7.4 (A) 4.0 - 5.6 %   HbA1c POC (<> result, manual entry)     HbA1c, POC (prediabetic range)     HbA1c, POC (controlled diabetic range)    POC Hemoccult Bld/Stl (3-Cd Home Screen)     Status: Normal   Collection Time: 12/19/17  2:58 PM  Result Value Ref Range   Card #1 Date 856314    Fecal Occult Blood, POC Negative Negative   Card #2 Date 970263    Card #2 Fecal Occult Blod, POC Negative    Card #3 Date [id number]    Card #3 Fecal Occult Blood, POC Negative       PHQ2/9: Depression screen [facility] 2/9 12/06/2017 11/19/2017 09/23/2017 08/05/2017  07/25/2017  Decreased Interest 0 0 0 1 0  Down, Depressed, Hopeless 0 - 0 0 0  PHQ - 2 Score 0 0 0 1 0  Altered sleeping 0 - - 0 -  Tired,  decreased energy 0 - - 1 -  Change in appetite 0 - - 0 -  Feeling bad or failure about yourself  0 - - 0 -  Trouble concentrating 0 - - 0 -  Moving slowly or fidgety/restless 0 - - 0 -  Suicidal thoughts 0 - - 0 -  PHQ-9 Score 0 - - 2 -  Difficult doing work/chores Not difficult at all - - Not difficult at all -     Fall Risk: Fall Risk  01/27/2018 11/19/2017 09/23/2017 08/05/2017 07/25/2017  Falls in the past year? 0 No No No No  Comment - - - - -  Number falls in past yr: - - - - -  Injury with Fall? - - - - -  Risk Factor Category  - - - - -      Assessment & Plan  1. Essential hypertension  - olmesartan (BENICAR) 20 MG tablet; Take 1 tablet (20 mg total) by mouth daily.  Dispense: 90 tablet; Refill: 1  2. Age-related osteoporosis without current pathological fracture  Seen by Dr. Honor Junes, off alendronate and now on Prolia   3. Viral upper respiratory tract infection  No longer wheezing, sore throat resolved, still has tessalon perles at home, pulse ox normal  4. Wears hearing aid in both ears  But did not put it on today   5. Dyslipidemia associated with type 2 diabetes mellitus ([place])  Continue medication  6. Anemia, unspecified type  Likely combination of iron deficiency and chronic diesease  7. Low ferritin level  Advised to take multivitamin with iron  Hemoccult card negative times 3

## 2018-03-14 DIAGNOSIS — R809 Proteinuria, unspecified: Secondary | ICD-10-CM | POA: Diagnosis not present

## 2018-03-14 DIAGNOSIS — R600 Localized edema: Secondary | ICD-10-CM | POA: Diagnosis not present

## 2018-03-14 DIAGNOSIS — E1129 Type 2 diabetes mellitus with other diabetic kidney complication: Secondary | ICD-10-CM | POA: Diagnosis not present

## 2018-03-14 DIAGNOSIS — I129 Hypertensive chronic kidney disease with stage 1 through stage 4 chronic kidney disease, or unspecified chronic kidney disease: Secondary | ICD-10-CM | POA: Diagnosis not present

## 2018-03-14 DIAGNOSIS — N183 Chronic kidney disease, stage 3 (moderate): Secondary | ICD-10-CM | POA: Diagnosis not present

## 2018-03-18 ENCOUNTER — Other Ambulatory Visit: Payer: Self-pay | Admitting: Nephrology

## 2018-03-18 DIAGNOSIS — N183 Chronic kidney disease, stage 3 unspecified: Secondary | ICD-10-CM

## 2018-03-18 DIAGNOSIS — R809 Proteinuria, unspecified: Secondary | ICD-10-CM

## 2018-03-19 DIAGNOSIS — M79674 Pain in right toe(s): Secondary | ICD-10-CM | POA: Diagnosis not present

## 2018-03-19 DIAGNOSIS — M79675 Pain in left toe(s): Secondary | ICD-10-CM | POA: Diagnosis not present

## 2018-03-19 DIAGNOSIS — B351 Tinea unguium: Secondary | ICD-10-CM | POA: Diagnosis not present

## 2018-03-25 ENCOUNTER — Ambulatory Visit
Admission: RE | Admit: 2018-03-25 | Discharge: 2018-03-25 | Disposition: A | Discharge status: Home / Self Care | Payer: Medicare Other | Source: Ambulatory Visit | Attending: Nephrology | Admitting: Nephrology

## 2018-03-25 DIAGNOSIS — N189 Chronic kidney disease, unspecified: Secondary | ICD-10-CM | POA: Diagnosis not present

## 2018-03-25 DIAGNOSIS — N183 Chronic kidney disease, stage 3 unspecified: Secondary | ICD-10-CM

## 2018-03-25 DIAGNOSIS — R809 Proteinuria, unspecified: Secondary | ICD-10-CM

## 2018-03-27 ENCOUNTER — Encounter: Payer: Medicare Other | Admitting: Nurse Practitioner

## 2018-03-31 ENCOUNTER — Other Ambulatory Visit: Payer: Self-pay

## 2018-03-31 ENCOUNTER — Ambulatory Visit
Admission: RE | Admit: 2018-03-31 | Discharge: 2018-03-31 | Disposition: A | Discharge status: Home / Self Care | Payer: Medicare Other | Source: Ambulatory Visit | Attending: Nurse Practitioner | Admitting: Nurse Practitioner

## 2018-03-31 ENCOUNTER — Ambulatory Visit (HOSPITAL_BASED_OUTPATIENT_CLINIC_OR_DEPARTMENT_OTHER): Payer: Medicare Other | Admitting: Nurse Practitioner

## 2018-03-31 ENCOUNTER — Encounter: Payer: Self-pay | Admitting: Nurse Practitioner

## 2018-03-31 VITALS — BP 132/64 | HR 64 | Temp 97.7°F | Ht 61.0 in | Wt 207.0 lb

## 2018-03-31 DIAGNOSIS — G8929 Other chronic pain: Secondary | ICD-10-CM

## 2018-03-31 DIAGNOSIS — Z96651 Presence of right artificial knee joint: Secondary | ICD-10-CM | POA: Diagnosis not present

## 2018-03-31 DIAGNOSIS — M545 Low back pain: Secondary | ICD-10-CM

## 2018-03-31 DIAGNOSIS — M159 Polyosteoarthritis, unspecified: Secondary | ICD-10-CM

## 2018-03-31 DIAGNOSIS — M25561 Pain in right knee: Secondary | ICD-10-CM

## 2018-03-31 DIAGNOSIS — G894 Chronic pain syndrome: Secondary | ICD-10-CM

## 2018-03-31 DIAGNOSIS — Z471 Aftercare following joint replacement surgery: Secondary | ICD-10-CM | POA: Diagnosis not present

## 2018-03-31 MED ORDER — OXYCODONE-ACETAMINOPHEN 7.5-325 MG PO TABS: 1.0000 | ORAL_TABLET | Freq: Three times a day (TID) | ORAL | 0 refills | Status: DC | PRN

## 2018-03-31 MED ORDER — OXYCODONE-ACETAMINOPHEN 7.5-325 MG PO TABS: 1.0000 | ORAL_TABLET | Freq: Three times a day (TID) | ORAL | 0 refills | Status: AC | PRN

## 2018-03-31 NOTE — Progress Notes (Signed)
Patient's Name: Taylor [name]  MRN: [id number]  Referring Provider: [name], MD  DOB: [birth year]  PCP: [name], MD  DOS: 03/31/2018  Note by: Dionisio David, NP  Service setting: Ambulatory outpatient  Specialty: Interventional Pain Management  Location: [facility] (AMB) Pain Management Facility    Patient type: Established   HPI  Reason for Visit: Taylor [name] is a 85 y.o. year old, female patient, who comes today with a chief complaint of Knee Pain Last Appointment: She was last seen by me on 01/21/2018. Pain Assessment: Today, Taylor [name] describes the severity of the Chronic pain as a 7 /10. She indicates the location/referral of the pain to be Knee Left, Right/Denies. Onset was: More than a month ago. The quality of pain is described as Aching, Throbbing. Temporal description, or timing of pain is: Constant. Possible modifying factors: medications, ice and heat. Taylor [name] describes the pain effects on ADL as: unable to do much.  Taylor [name]  height is 5' 1"  (1.549 m) and weight is 207 lb (93.9 kg). Her temperature is 97.7 F (36.5 C). Her blood pressure is 132/64 and her pulse is 64. Her oxygen saturation is 97%. She admits that her right knee pain is getting worse.  She has swelling and pain.  She denies any recent injuries.  She is status post bilateral total knee replacements.  She denies any pain or discomfort in her left knee.  She does use topical agents along with her oxycodone.  Controlled Substance Pharmacotherapy Assessment REMS (Risk Evaluation and Mitigation Strategy)  Analgesic:Oxycodone/acetaminophen 7.5/325 mg 3 times daily MME/day:33.50m/day. Taylor Fischer RN  03/31/2018  1:51 PM  Sign when Signing Visit Nursing Pain Medication Assessment:  Safety precautions to be maintained throughout the outpatient stay will include: orient to surroundings, keep bed in low position, maintain call bell within reach at all times, provide assistance with transfer out of bed  and ambulation.  Medication Inspection Compliance: Pill count conducted under aseptic conditions, in front of the patient. Neither the pills nor the bottle was removed from the patient's sight at any time. Once count was completed pills were immediately returned to the patient in their original bottle.  Medication: Oxycodone/APAP Pill/Patch Count: 28 of 90 pills remain Pill/Patch Appearance: Markings consistent with prescribed medication Bottle Appearance: Standard pharmacy container. Clearly labeled. Filled Date: 1 / 179/ 2020 Last Medication intake:  Today   Pharmacokinetics: Liberation and absorption (onset of action): WNL Distribution (time to peak effect): WNL Metabolism and excretion (duration of action): WNL         Pharmacodynamics: Desired effects: Analgesia: Ms. CRobertshawreports >50% benefit. Functional ability: Patient reports that medication allows her to accomplish basic ADLs Clinically meaningful improvement in function (CMIF): Sustained CMIF goals met Perceived effectiveness: Described as relatively effective, allowing for increase in activities of daily living (ADL) Undesirable effects: Side-effects or Adverse reactions: None reported Monitoring: [place] PMP: Online review of the past 153-montheriod conducted. Compliant with practice rules and regulations Last UDS on record: Summary  Date Value Ref Range Status  05/27/2017 FINAL  Final    Comment:    ==================================================================== TOXASSURE SELECT 13 (MW) ==================================================================== Test                             Result       Flag       Units Drug Present and Declared for Prescription Verification   Oxycodone  494          EXPECTED   ng/mg creat   Oxymorphone                    164          EXPECTED   ng/mg creat   Noroxycodone                   540          EXPECTED   ng/mg creat    Sources of oxycodone include  scheduled prescription medications.    Oxymorphone and noroxycodone are expected metabolites of    oxycodone. Oxymorphone is also available as a scheduled    prescription medication. ==================================================================== Test                      Result    Flag   Units      Ref Range   Creatinine              87               mg/dL      >=20 ==================================================================== Declared Medications:  The flagging and interpretation on this report are based on the  following declared medications.  Unexpected results may arise from  inaccuracies in the declared medications.  **Note: The testing scope of this panel includes these medications:  Oxycodone (Percocet)  **Note: The testing scope of this panel does not include following  reported medications:  Acetaminophen  Acetaminophen (Percocet)  Alendronate (Fosamax)  Amlodipine (Norvasc)  Besifloxacin (Besivance)  Bimatoprost (Lumigan)  Brimonidine Tartrate  Cholecalciferol  Estrogen (Premarin)  Fluticasone (Flonase)  Ketoconazole (Nizoral)  Metformin (Glucophage)  Olmesartan (Benicar)  Omega-3 Fatty Acids (Lovaza)  Pravastatin (Pravachol)  Ranitidine (Zantac)  Timolol  Vitamin D2 (Drisdol) ==================================================================== For clinical consultation, please call [phone]. ====================================================================    UDS interpretation: Compliant          Medication Assessment Form: Reviewed. Patient indicates being compliant with therapy Treatment compliance: Compliant Risk Assessment Profile: Aberrant behavior: See initial evaluations. None observed or detected today Comorbid factors increasing risk of overdose: See initial evaluation. No additional risks detected today Opioid risk tool (ORT):  Opioid Risk  03/31/2018  Alcohol 0  Illegal Drugs 0  Rx Drugs 0  Alcohol 0  Illegal Drugs 0  Rx  Drugs 0  Age between 16-45 years  0  History of Preadolescent Sexual Abuse 0  Psychological Disease 0  Depression 0  Opioid Risk Tool Scoring 0  Opioid Risk Interpretation Low Risk    ORT Scoring interpretation table:  Score <3 = Low Risk for SUD  Score between 4-7 = Moderate Risk for SUD  Score >8 = High Risk for Opioid Abuse   Risk of substance use disorder (SUD): Low  Risk Mitigation Strategies:  Patient Counseling: Covered Patient-Prescriber Agreement (PPA): Present and active  Notification to other healthcare providers: Done  Pharmacologic Plan: No change in therapy, at this time.             ROS  Constitutional: Denies any fever or chills Gastrointestinal: No reported hemesis, hematochezia, vomiting, or acute GI distress Musculoskeletal: Denies any acute onset joint swelling, redness, loss of ROM, or weakness Neurological: No reported episodes of acute onset apraxia, aphasia, dysarthria, agnosia, amnesia, paralysis, loss of coordination, or loss of consciousness  Medication Review  Cholecalciferol, ONETOUCH DELICA LANCETS FINE, acetaminophen, amLODipine, benzonatate, bimatoprost, brimonidine-timolol, conjugated estrogens, famotidine, ferrous sulfate, fluticasone,  glucose blood, metFORMIN, olmesartan, omega-3 acid ethyl esters, oxyCODONE-acetaminophen, and pravastatin  History Review  Allergy: Ms. Bethune is allergic to ace inhibitors. Drug: Ms. Goodridge  reports no history of drug use. Alcohol:  reports no history of alcohol use. Tobacco:  reports that she has never smoked. She has never used smokeless tobacco. Social: Ms. Portillo  reports that she has never smoked. She has never used smokeless tobacco. She reports that she does not drink alcohol or use drugs. Medical:  has a past medical history of Abnormal mammogram, unspecified (2013), Breast screening, unspecified (2013), Diabetes mellitus without complication ([place]) (1884), GERD (gastroesophageal reflux disease), Glaucoma  (2003), Gout, Hyperlipidemia (2008), Hypertension (1980's), Lump or mass in breast (01/03/2012), Obesity, unspecified (2013), Osteoporosis, Shingles (2013), and Special screening for malignant neoplasms, colon (2013). Surgical: Ms. Azzaro  has a past surgical history that includes Abdominal hysterectomy (1958); Knee surgery (2009,2011,2012); Lipoma excision (1998); Spine surgery (2004); Tonsillectomy; Colonoscopy (2003); Coronary artery bypass graft (Left, 01/03/2012,01/24/2012); Cataract extraction, bilateral (Bilateral); Cholecystectomy; Back surgery; Knee surgery; and Breast biopsy (Left, 01/25/2012). Family: family history includes Cancer in her mother; Heart disease in her father; Stroke in her mother. Problem List: Ms. Stringfield has Osteoarthritis, multiple sites; Chronic pain; Chronic midline low back pain without sciatica; Chronic pain of right knee; Chronic pain syndrome; and Pain in both hands on their pertinent problem list.  Lab Review  Kidney Function Lab Results  Component Value Date   BUN 22 08/05/2017   CREATININE 1.31 (H) 08/05/2017   BCR 17 08/05/2017   GFRAA 44 (L) 08/05/2017   GFRNONAA 38 (L) 08/05/2017  Liver Function Lab Results  Component Value Date   AST 8 (L) 08/05/2017   ALT 6 08/05/2017   ALBUMIN 4.0 08/14/2016  Note: Above Lab results reviewed.  Imaging Review  Note: Above imaging results reviewed.        Physical Exam  General appearance: Well nourished, well developed, and well hydrated. In no apparent acute distress Mental status: Alert, oriented x 3 (person, place, & time)       Respiratory: No evidence of acute respiratory distress Eyes: PERLA Vitals: BP 132/64   Pulse 64   Temp 97.7 F (36.5 C)   Ht 5' 1"  (1.549 m)   Wt 207 lb (93.9 kg)   SpO2 97%   BMI 39.11 kg/m  BMI: Estimated body mass index is 39.11 kg/m as calculated from the following:   Height as of this encounter: 5' 1"  (1.549 m).   Weight as of this encounter: 207 lb (93.9  kg). Ideal: Ideal body weight: 47.8 kg (105 lb 6.1 oz) Adjusted ideal body weight: 66.2 kg (146 lb 0.5 oz)  Gait & Posture Assessment  Ambulation: Patient ambulates using a walker Gait: Relatively normal for age and body habitus Posture: WNL   Lower Extremity Exam    Side: Right lower extremity  Side: Left lower extremity  Stability: No instability observed          Stability: No instability observed          Skin & Extremity Inspection: Evidence of prior arthroplastic surgery small amount of bruising to shin  Skin & Extremity Inspection: Evidence of prior arthroplastic surgery  Functional ROM: Adequate ROM                  Functional ROM: Adequate ROM                  Muscle Tone/Strength: Normal strength (5/5)  Muscle Tone/Strength: Normal  strength (5/5)  Sensory (Neurological): Unimpaired        Sensory (Neurological): Unimpaired            Palpation: Complains of area being tender to palpation  Palpation: No palpable anomalies   Assessment   Status Diagnosis  Worsening Persistent Persistent 1. Chronic pain of right knee   2. Osteoarthritis of multiple joints, unspecified osteoarthritis type   3. Chronic midline low back pain without sciatica   4. Chronic pain syndrome      Updated Problems: No problems updated.  Plan of Care  Medications: I have changed Epsie Walthall. Iannone's oxyCODONE-acetaminophen, oxyCODONE-acetaminophen, and oxyCODONE-acetaminophen. I am also having her maintain her bimatoprost, Cholecalciferol, brimonidine-timolol, acetaminophen, ONETOUCH DELICA LANCETS FINE, conjugated estrogens, glucose blood, fluticasone, amLODipine, omega-3 acid ethyl esters, pravastatin, famotidine, metFORMIN, benzonatate, olmesartan, and ferrous sulfate.  Administered today: [name] had no medications administered during this visit.  Orders:  Orders Placed This Encounter  Procedures  . DG Knee 1-2 Views Right    Standing Status:   Future    Standing Expiration Date:    03/31/2019    Order Specific Question:   Reason for Exam (SYMPTOM  OR DIAGNOSIS REQUIRED)    Answer:   Right knee pain/arthralgia    Order Specific Question:   Preferred imaging location?    Answer:   [facility]    Order Specific Question:   Call Results- Best Contact Number?    Answer:   [phone] (Pain Clinic facility)    Interventional options: Planned follow-up:    Return in about 3 months (around 06/29/2018) for MedMgmt.    Note by: Dionisio David, NP Date: 03/31/2018; Time: 2:31 PM

## 2018-03-31 NOTE — Patient Instructions (Signed)
____________________________________________________________________________________________  Medication Rules  Purpose: To inform patients, and their family members, of our rules and regulations.  Applies to: All patients receiving prescriptions (written or electronic).  Pharmacy of record: Pharmacy where electronic prescriptions will be sent. If written prescriptions are taken to a different pharmacy, please inform the nursing staff. The pharmacy listed in the electronic medical record should be the one where you would like electronic prescriptions to be sent.  Electronic prescriptions: In compliance with the [place] Strengthen Opioid Misuse Prevention (STOP) Act of 2017 (Session Law 2017-74/H243), effective February 19, 2018, all controlled substances must be electronically prescribed. Calling prescriptions to the pharmacy will cease to exist.  Prescription refills: Only during scheduled appointments. Applies to all prescriptions.  NOTE: The following applies primarily to controlled substances (Opioid* Pain Medications).   Patient's responsibilities: 1. Pain Pills: Bring all pain pills to every appointment (except for procedure appointments). 2. Pill Bottles: Bring pills in original pharmacy bottle. Always bring the newest bottle. Bring bottle, even if empty. 3. Medication refills: You are responsible for knowing and keeping track of what medications you take and those you need refilled. The day before your appointment: write a list of all prescriptions that need to be refilled. The day of the appointment: give the list to the admitting nurse. Prescriptions will be written only during appointments. No prescriptions will be written on procedure days. If you forget a medication: it will not be "Called in", "Faxed", or "electronically sent". You will need to get another appointment to get these prescribed. No early refills. Do not call asking to have your prescription filled  early. 4. Prescription Accuracy: You are responsible for carefully inspecting your prescriptions before leaving our office. Have the discharge nurse carefully go over each prescription with you, before taking them home. Make sure that your name is accurately spelled, that your address is correct. Check the name and dose of your medication to make sure it is accurate. Check the number of pills, and the written instructions to make sure they are clear and accurate. Make sure that you are given enough medication to last until your next medication refill appointment. 5. Taking Medication: Take medication as prescribed. When it comes to controlled substances, taking less pills or less frequently than prescribed is permitted and encouraged. Never take more pills than instructed. Never take medication more frequently than prescribed.  6. Inform other Doctors: Always inform, all of your healthcare providers, of all the medications you take. 7. Pain Medication from other Providers: You are not allowed to accept any additional pain medication from any other Doctor or Healthcare provider. There are two exceptions to this rule. (see below) In the event that you require additional pain medication, you are responsible for notifying us, as stated below. 8. Medication Agreement: You are responsible for carefully reading and following our Medication Agreement. This must be signed before receiving any prescriptions from our practice. Safely store a copy of your signed Agreement. Violations to the Agreement will result in no further prescriptions. (Additional copies of our Medication Agreement are available upon request.) 9. Laws, Rules, & Regulations: All patients are expected to follow all Federal and State Laws, Statutes, Rules, & Regulations. Ignorance of the Laws does not constitute a valid excuse. The use of any illegal substances is prohibited. 10. Adopted CDC guidelines & recommendations: Target dosing levels will be  at or below 60 MME/day. Use of benzodiazepines** is not recommended.  Exceptions: There are only two exceptions to the rule of not   receiving pain medications from other Healthcare Providers. 1. Exception #1 (Emergencies): In the event of an emergency (i.e.: accident requiring emergency care), you are allowed to receive additional pain medication. However, you are responsible for: As soon as you are able, call our office [phone], at any time of the day or night, and leave a message stating your name, the date and nature of the emergency, and the name and dose of the medication prescribed. In the event that your call is answered by a member of our staff, make sure to document and save the date, time, and the name of the person that took your information.  2. Exception #2 (Planned Surgery): In the event that you are scheduled by another doctor or dentist to have any type of surgery or procedure, you are allowed (for a period no longer than 30 days), to receive additional pain medication, for the acute post-op pain. However, in this case, you are responsible for picking up a copy of our "Post-op Pain Management for Surgeons" handout, and giving it to your surgeon or dentist. This document is available at our office, and does not require an appointment to obtain it. Simply go to our office during business hours (Monday-Thursday from 8:00 AM to 4:00 PM) (Friday 8:00 AM to 12:00 Noon) or if you have a scheduled appointment with us, prior to your surgery, and ask for it by name. In addition, you will need to provide us with your name, name of your surgeon, type of surgery, and date of procedure or surgery.  *Opioid medications include: morphine, codeine, oxycodone, oxymorphone, hydrocodone, hydromorphone, meperidine, tramadol, tapentadol, buprenorphine, fentanyl, methadone. **Benzodiazepine medications include: diazepam (Valium), alprazolam (Xanax), clonazepam (Klonopine), lorazepam (Ativan), clorazepate  (Tranxene), chlordiazepoxide (Librium), estazolam (Prosom), oxazepam (Serax), temazepam (Restoril), triazolam (Halcion) (Last updated: 04/18/2017) ____________________________________________________________________________________________    

## 2018-03-31 NOTE — Progress Notes (Signed)
Nursing Pain Medication Assessment:  Safety precautions to be maintained throughout the outpatient stay will include: orient to surroundings, keep bed in low position, maintain call bell within reach at all times, provide assistance with transfer out of bed and ambulation.  Medication Inspection Compliance: Pill count conducted under aseptic conditions, in front of the patient. Neither the pills nor the bottle was removed from the patient's sight at any time. Once count was completed pills were immediately returned to the patient in their original bottle.  Medication: Oxycodone/APAP Pill/Patch Count: 28 of 90 pills remain Pill/Patch Appearance: Markings consistent with prescribed medication Bottle Appearance: Standard pharmacy container. Clearly labeled. Filled Date: 1 / 50 / 2020 Last Medication intake:  Today

## 2018-04-07 ENCOUNTER — Other Ambulatory Visit: Payer: Self-pay

## 2018-04-07 DIAGNOSIS — E1142 Type 2 diabetes mellitus with diabetic polyneuropathy: Secondary | ICD-10-CM

## 2018-04-08 ENCOUNTER — Ambulatory Visit (INDEPENDENT_AMBULATORY_CARE_PROVIDER_SITE_OTHER): Payer: Medicare Other | Admitting: Family Medicine

## 2018-04-08 ENCOUNTER — Encounter: Payer: Self-pay | Admitting: Family Medicine

## 2018-04-08 ENCOUNTER — Ambulatory Visit (INDEPENDENT_AMBULATORY_CARE_PROVIDER_SITE_OTHER): Payer: Medicare Other

## 2018-04-08 VITALS — BP 126/68 | HR 79 | Temp 98.1°F | Resp 16 | Ht 61.0 in | Wt 205.3 lb

## 2018-04-08 DIAGNOSIS — Z599 Problem related to housing and economic circumstances, unspecified: Secondary | ICD-10-CM

## 2018-04-08 DIAGNOSIS — Z598 Other problems related to housing and economic circumstances: Secondary | ICD-10-CM

## 2018-04-08 DIAGNOSIS — Z0000 Encounter for general adult medical examination without abnormal findings: Secondary | ICD-10-CM

## 2018-04-08 DIAGNOSIS — N183 Chronic kidney disease, stage 3 unspecified: Secondary | ICD-10-CM

## 2018-04-08 DIAGNOSIS — E213 Hyperparathyroidism, unspecified: Secondary | ICD-10-CM | POA: Diagnosis not present

## 2018-04-08 DIAGNOSIS — E785 Hyperlipidemia, unspecified: Secondary | ICD-10-CM | POA: Diagnosis not present

## 2018-04-08 DIAGNOSIS — I700 Atherosclerosis of aorta: Secondary | ICD-10-CM

## 2018-04-08 DIAGNOSIS — Z1231 Encounter for screening mammogram for malignant neoplasm of breast: Secondary | ICD-10-CM | POA: Diagnosis not present

## 2018-04-08 DIAGNOSIS — E1169 Type 2 diabetes mellitus with other specified complication: Secondary | ICD-10-CM

## 2018-04-08 DIAGNOSIS — M150 Primary generalized (osteo)arthritis: Secondary | ICD-10-CM | POA: Diagnosis not present

## 2018-04-08 DIAGNOSIS — R011 Cardiac murmur, unspecified: Secondary | ICD-10-CM

## 2018-04-08 DIAGNOSIS — M159 Polyosteoarthritis, unspecified: Secondary | ICD-10-CM

## 2018-04-08 DIAGNOSIS — M8949 Other hypertrophic osteoarthropathy, multiple sites: Secondary | ICD-10-CM

## 2018-04-08 DIAGNOSIS — I129 Hypertensive chronic kidney disease with stage 1 through stage 4 chronic kidney disease, or unspecified chronic kidney disease: Secondary | ICD-10-CM

## 2018-04-08 LAB — POCT GLYCOSYLATED HEMOGLOBIN (HGB A1C): HbA1c, POC (controlled diabetic range): 7.2 % — AB (ref 0.0–7.0)

## 2018-04-08 MED ORDER — GLUCOSE BLOOD VI STRP: ORAL_STRIP | 12 refills | Status: DC

## 2018-04-08 NOTE — Patient Instructions (Signed)
Taylor [name] , Thank you for taking time to come for your Medicare Wellness Visit. I appreciate your ongoing commitment to your health goals. Please review the following plan we discussed and let me know if I can assist you in the future.   Screening recommendations/referrals: Colonoscopy: no longer required Mammogram: done 08/23/17. Please call [phone] to schedule your mammogram.  Bone Density: done 04/30/17. Repeat in 2021. Recommended yearly ophthalmology/optometry visit for glaucoma screening and checkup Recommended yearly dental visit for hygiene and checkup  Vaccinations: Influenza vaccine: done 12/06/17 Pneumococcal vaccine: done 08/05/15 Tdap vaccine: done 10/19/10 Shingles vaccine: Shingrix discussed. Please contact your pharmacy for coverage information.    Advanced directives: Please bring a copy of your health care power of attorney and living will to the office at your convenience.  Conditions/risks identified: Recommend drinking 6-8 glasses of water per day.   Next appointment: Please follow up in one year for your Medicare Annual Wellness visit.     Preventive Care 21 Years and Older, Female Preventive care refers to lifestyle choices and visits with your health care provider that can promote health and wellness. What does preventive care include?  A yearly physical exam. This is also called an annual well check.  Dental exams once or twice a year.  Routine eye exams. Ask your health care provider how often you should have your eyes checked.  Personal lifestyle choices, including:  Daily care of your teeth and gums.  Regular physical activity.  Eating a healthy diet.  Avoiding tobacco and drug use.  Limiting alcohol use.  Practicing safe sex.  Taking low-dose aspirin every day.  Taking vitamin and mineral supplements as recommended by your health care provider. What happens during an annual well check? The services and screenings done by your health  care provider during your annual well check will depend on your age, overall health, lifestyle risk factors, and family history of disease. Counseling  Your health care provider may ask you questions about your:  Alcohol use.  Tobacco use.  Drug use.  Emotional well-being.  Home and relationship well-being.  Sexual activity.  Eating habits.  History of falls.  Memory and ability to understand (cognition).  Work and work Statistician.  Reproductive health. Screening  You may have the following tests or measurements:  Height, weight, and BMI.  Blood pressure.  Lipid and cholesterol levels. These may be checked every 5 years, or more frequently if you are over 37 years old.  Skin check.  Lung cancer screening. You may have this screening every year starting at age 27 if you have a 30-pack-year history of smoking and currently smoke or have quit within the past 15 years.  Fecal occult blood test (FOBT) of the stool. You may have this test every year starting at age 38.  Flexible sigmoidoscopy or colonoscopy. You may have a sigmoidoscopy every 5 years or a colonoscopy every 10 years starting at age 51.  Hepatitis C blood test.  Hepatitis B blood test.  Sexually transmitted disease (STD) testing.  Diabetes screening. This is done by checking your blood sugar (glucose) after you have not eaten for a while (fasting). You may have this done every 1-3 years.  Bone density scan. This is done to screen for osteoporosis. You may have this done starting at age 35.  Mammogram. This may be done every 1-2 years. Talk to your health care provider about how often you should have regular mammograms. Talk with your health care provider about your test  results, treatment options, and if necessary, the need for more tests. Vaccines  Your health care provider may recommend certain vaccines, such as:  Influenza vaccine. This is recommended every year.  Tetanus, diphtheria, and  acellular pertussis (Tdap, Td) vaccine. You may need a Td booster every 10 years.  Zoster vaccine. You may need this after age 63.  Pneumococcal 13-valent conjugate (PCV13) vaccine. One dose is recommended after age 41.  Pneumococcal polysaccharide (PPSV23) vaccine. One dose is recommended after age 56. Talk to your health care provider about which screenings and vaccines you need and how often you need them. This information is not intended to replace advice given to you by your health care provider. Make sure you discuss any questions you have with your health care provider. Document Released: 03/04/2015 Document Revised: 10/26/2015 Document Reviewed: 12/07/2014 Elsevier Interactive Patient Education  2017 [place] Prevention in the Home Falls can cause injuries. They can happen to people of all ages. There are many things you can do to make your home safe and to help prevent falls. What can I do on the outside of my home?  Regularly fix the edges of walkways and driveways and fix any cracks.  Remove anything that might make you trip as you walk through a door, such as a raised step or threshold.  Trim any bushes or trees on the path to your home.  Use bright outdoor lighting.  Clear any walking paths of anything that might make someone trip, such as rocks or tools.  Regularly check to see if handrails are loose or broken. Make sure that both sides of any steps have handrails.  Any raised decks and porches should have guardrails on the edges.  Have any leaves, snow, or ice cleared regularly.  Use sand or salt on walking paths during winter.  Clean up any spills in your garage right away. This includes oil or grease spills. What can I do in the bathroom?  Use night lights.  Install grab bars by the toilet and in the tub and shower. Do not use towel bars as grab bars.  Use non-skid mats or decals in the tub or shower.  If you need to sit down in the shower, use  a plastic, non-slip stool.  Keep the floor dry. Clean up any water that spills on the floor as soon as it happens.  Remove soap buildup in the tub or shower regularly.  Attach bath mats securely with double-sided non-slip rug tape.  Do not have throw rugs and other things on the floor that can make you trip. What can I do in the bedroom?  Use night lights.  Make sure that you have a light by your bed that is easy to reach.  Do not use any sheets or blankets that are too big for your bed. They should not hang down onto the floor.  Have a firm chair that has side arms. You can use this for support while you get dressed.  Do not have throw rugs and other things on the floor that can make you trip. What can I do in the kitchen?  Clean up any spills right away.  Avoid walking on wet floors.  Keep items that you use a lot in easy-to-reach places.  If you need to reach something above you, use a strong step stool that has a grab bar.  Keep electrical cords out of the way.  Do not use floor polish or wax that makes  floors slippery. If you must use wax, use non-skid floor wax.  Do not have throw rugs and other things on the floor that can make you trip. What can I do with my stairs?  Do not leave any items on the stairs.  Make sure that there are handrails on both sides of the stairs and use them. Fix handrails that are broken or loose. Make sure that handrails are as long as the stairways.  Check any carpeting to make sure that it is firmly attached to the stairs. Fix any carpet that is loose or worn.  Avoid having throw rugs at the top or bottom of the stairs. If you do have throw rugs, attach them to the floor with carpet tape.  Make sure that you have a light switch at the top of the stairs and the bottom of the stairs. If you do not have them, ask someone to add them for you. What else can I do to help prevent falls?  Wear shoes that:  Do not have high heels.  Have  rubber bottoms.  Are comfortable and fit you well.  Are closed at the toe. Do not wear sandals.  If you use a stepladder:  Make sure that it is fully opened. Do not climb a closed stepladder.  Make sure that both sides of the stepladder are locked into place.  Ask someone to hold it for you, if possible.  Clearly mark and make sure that you can see:  Any grab bars or handrails.  First and last steps.  Where the edge of each step is.  Use tools that help you move around (mobility aids) if they are needed. These include:  Canes.  Walkers.  Scooters.  Crutches.  Turn on the lights when you go into a dark area. Replace any light bulbs as soon as they burn out.  Set up your furniture so you have a clear path. Avoid moving your furniture around.  If any of your floors are uneven, fix them.  If there are any pets around you, be aware of where they are.  Review your medicines with your doctor. Some medicines can make you feel dizzy. This can increase your chance of falling. Ask your doctor what other things that you can do to help prevent falls. This information is not intended to replace advice given to you by your health care provider. Make sure you discuss any questions you have with your health care provider. Document Released: 12/02/2008 Document Revised: 07/14/2015 Document Reviewed: 03/12/2014 Elsevier Interactive Patient Education  2017 [employer].

## 2018-04-08 NOTE — Progress Notes (Signed)
Subjective:   Taylor [name] is a 85 y.o. female who presents for Medicare Annual (Subsequent) preventive examination.  Review of Systems:   Cardiac Risk Factors include: advanced age (>80men, >3 women);dyslipidemia;hypertension;obesity (BMI >30kg/m2)      Objective:     Vitals: BP 126/68 (BP Location: Left Arm, Patient Position: Sitting, Cuff Size: Large)   Pulse 79   Temp 98.1 F (36.7 C) (Oral)   Resp 16   Ht 5\' 1"  (1.549 m)   Wt 205 lb 4.8 oz (93.1 kg)   SpO2 98%   BMI 38.79 kg/m   Body mass index is 38.79 kg/m.  Advanced Directives 04/08/2018 09/23/2017 05/27/2017 01/07/2017 11/13/2016 09/13/2016 08/14/2016  Does Patient Have a Medical Advance Directive? Yes No No No No Yes No  Type of Paramedic of [place];Living will - - - - Living will -  Does patient want to make changes to medical advance directive? - - - - - - -  Copy of [place] in Chart? No - copy requested - - - - - -  Would patient like information on creating a medical advance directive? - No - Patient declined No - Patient declined - - - -    Tobacco Social History   Tobacco Use  Smoking Status Never Smoker  Smokeless Tobacco Never Used     Counseling given: Not Answered   Clinical Intake:  Pre-visit preparation completed: Yes  Pain : No/denies pain Pain Score: 0-No pain     BMI - recorded: 38.79 Nutritional Status: BMI > 30  Obese Nutritional Risks: None Diabetes: Yes CBG done?: No Did pt. bring in CBG monitor from home?: No  How often do you need to have someone help you when you read instructions, pamphlets, or other written materials from your doctor or pharmacy?: 1 - Never  Interpreter Needed?: No  Information entered by :: [name] LPN  Past Medical History:  Diagnosis Date  . Abnormal mammogram, unspecified 2013  . Breast screening, unspecified 2013  . Diabetes mellitus without complication ([place]) 1962   non insulin dependent  .  GERD (gastroesophageal reflux disease)   . Glaucoma 2003  . Gout   . Hyperlipidemia 2008  . Hypertension 1980's  . Lump or mass in breast 01/03/2012   left breast  . Obesity, unspecified 2013  . Osteoporosis   . Shingles 2013  . Special screening for malignant neoplasms, colon 2013   Past Surgical History:  Procedure Laterality Date  . ABDOMINAL HYSTERECTOMY  1958   menorrhagia  . BACK SURGERY    . BREAST BIOPSY Left 01/25/2012  . CATARACT EXTRACTION, BILATERAL Bilateral    1st 06/07/15 and the 2nd 06/21/15  . CHOLECYSTECTOMY    . COLONOSCOPY  2003   [facility]  . CORONARY ARTERY BYPASS GRAFT Left 01/03/2012,01/24/2012   Left FNA, Left breast [facility] bx  . KNEE SURGERY  2009,2011,2012   twice on right and once on left  . KNEE SURGERY    . LIPOMA EXCISION  1998  . Platea  2004  . TONSILLECTOMY     age of 89   Family History  Problem Relation Age of Onset  . Cancer Mother        skin cancer  . Stroke Mother   . Heart disease Father   . Breast cancer Neg Hx    Social History   Socioeconomic History  . Marital status: Divorced    Spouse name: Not on file  .  Number of children: 4  . Years of education: Not on file  . Highest education level: Some college, no degree  Occupational History  . 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 Financial resource strain: Not very hard  . Food insecurity:    Worry: Never true    Inability: Never true  . Transportation needs:    Medical: No    Non-medical: No  Tobacco Use  . Smoking status: Never Smoker  . Smokeless tobacco: Never Used  Substance and Sexual Activity  . Alcohol use: No    Alcohol/week: 0.0 standard drinks  . Drug use: No  . Sexual activity: Not Currently  Lifestyle  . Physical activity:    Days per week: 0 days    Minutes per session: 0 min  . Stress: Not at all  Relationships  . Social connections:    Talks on phone: More than three times a week    Gets  together: Twice a week    Attends religious service: More than 4 times per year    Active member of club or organization: Yes    Attends meetings of clubs or organizations: More than 4 times per year    Relationship status: Divorced  Other Topics Concern  . Not on file  Social History Narrative   Lives alone   Children do not live in town   Friend helps out: Taylor [name] - [phone]   Independent prior to her fall 10/2016    Outpatient Encounter Medications as of 04/08/2018  Medication Sig  . acetaminophen (TYLENOL) 500 MG tablet Take 1 tablet (500 mg total) by mouth 3 (three) times daily. (Patient taking differently: Take 500 mg by mouth as needed. )  . amLODipine (NORVASC) 10 MG tablet TAKE 1 TABLET BY MOUTH EVERY DAY  . bimatoprost (LUMIGAN) 0.01 % SOLN Place 1 drop into both eyes daily.   . brimonidine-timolol (COMBIGAN) 0.2-0.5 % ophthalmic solution Place 1 drop into both eyes See admin instructions. 1 drop into both eyes in the morning, then 1 drop into both eyes 8 hours later  . Cholecalciferol (VITAMIN D-1000 MAX ST) 1000 UNITS tablet Take 1,000 Units by mouth daily. Reported on 08/05/2015  . conjugated estrogens (PREMARIN) vaginal cream Place 1 Applicatorful vaginally daily.  . famotidine (PEPCID) 20 MG tablet Take 1 tablet (20 mg total) by mouth daily.  . ferrous sulfate 325 (65 FE) MG tablet Take 1 tablet (325 mg total) by mouth daily with breakfast.  . fluticasone (FLONASE) 50 MCG/ACT nasal spray SPRAY 2 SPRAYS INTO EACH NOSTRIL EVERY DAY (Patient taking differently: PRN)  . glucose blood test strip Use as instructed  . metFORMIN (GLUCOPHAGE-XR) 750 MG 24 hr tablet TAKE 2 TABLETS (1,500 MG TOTAL) BY MOUTH DAILY WITH BREAKFAST.  [name] olmesartan (BENICAR) 20 MG tablet Take 1 tablet (20 mg total) by mouth daily.  [name] omega-3 acid ethyl esters (LOVAZA) 1 g capsule TAKE 1 CAPSULE (1 G TOTAL) BY MOUTH 2 (TWO) TIMES DAILY.  [name] ONETOUCH DELICA LANCETS FINE MISC 1 each by Does not  apply route daily. [place]  . oxyCODONE-acetaminophen (PERCOCET) 7.5-325 MG tablet Take 1 tablet by mouth every 8 (eight) hours as needed for up to 30 days for moderate pain or severe pain.  . pravastatin (PRAVACHOL) 40 MG tablet TAKE 1 TABLET BY MOUTH EVERYDAY AT BEDTIME  . [START ON 06/03/2018] oxyCODONE-acetaminophen (PERCOCET) 7.5-325 MG tablet Take  1 tablet by mouth every 8 (eight) hours as needed for up to 30 days for moderate pain or severe pain. (Patient not taking: Reported on 04/08/2018)  . [START ON 05/04/2018] oxyCODONE-acetaminophen (PERCOCET) 7.5-325 MG tablet Take 1 tablet by mouth every 8 (eight) hours as needed for up to 30 days for moderate pain or severe pain. (Patient not taking: Reported on 04/08/2018)  . [DISCONTINUED] benzonatate (TESSALON) 200 MG capsule Take 1 capsule (200 mg total) by mouth 2 (two) times daily as needed for cough.   No facility-administered encounter medications on file as of 04/08/2018.     Activities of Daily Living In your present state of health, do you have any difficulty performing the following activities: 04/08/2018 12/06/2017  Hearing? Tempie Donning  Comment has hearing aids -  Vision? Y N  Comment wears glasses but has a hard time seeing sometimes -  Difficulty concentrating or making decisions? N N  Walking or climbing stairs? Y Y  Comment - -  Dressing or bathing? N Y  Doing errands, shopping? N Y  Conservation officer, nature and eating ? N -  Using the Toilet? N -  In the past six months, have you accidently leaked urine? Y -  Comment wears depends at night -  Do you have problems with loss of bowel control? N -  Managing your Medications? N -  Managing your Finances? N -  Housekeeping or managing your Housekeeping? N -  Some recent data might be hidden    Patient Care Team: [name], MD as PCP - General (Family Medicine) Andree Elk Alvina Filbert, MD as Consulting Physician (Anesthesiology) [name], DPM as Consulting Physician  (Podiatry)    Assessment:   This is a routine wellness examination for Taylor [name].  Exercise Activities and Dietary recommendations Current Exercise Habits: The patient does not participate in regular exercise at present, Exercise limited by: orthopedic condition(s)  Goals    . DIET - INCREASE WATER INTAKE     Recommend drinking 6-8 glasses of water per day       Fall Risk Fall Risk  04/08/2018 03/31/2018 01/27/2018 11/19/2017 09/23/2017  Falls in the past year? 0 0 0 No No  Comment - - - - -  Number falls in past yr: 0 - - - -  Injury with Fall? 0 - - - -  Risk Factor Category  - - - - -  Risk for fall due to : Impaired balance/gait - - - -  Follow up Falls prevention discussed - - - -   FALL RISK PREVENTION PERTAINING TO THE HOME:  Any stairs in or around the home? No  If so, do they handrails? No   Home free of loose throw rugs in walkways, pet beds, electrical cords, etc? Yes  Adequate lighting in your home to reduce risk of falls? Yes   ASSISTIVE DEVICES UTILIZED TO PREVENT FALLS:  Life alert? Yes  Use of a cane, walker or w/c? Yes  Grab bars in the bathroom? Yes  Shower chair or bench in shower? Yes  Elevated toilet seat or a handicapped toilet? Yes   DME ORDERS:  DME order needed?  No   TIMED UP AND GO:  Was the test performed? Yes .  Length of time to ambulate 10 feet: 8 sec.   GAIT:  Appearance of gait: Gait slow, steady and with the use of an assistive device.   Education: Fall risk prevention has been discussed.  Intervention(s) required? No   Depression Screen  PHQ 2/9 Scores 04/08/2018 12/06/2017 11/19/2017 09/23/2017  PHQ - 2 Score 0 0 0 0  PHQ- 9 Score - 0 - -  Exception Documentation - - - -     Cognitive Function     6CIT Screen 04/08/2018  What Year? 0 points  What month? 0 points  What time? 0 points  Count back from 20 0 points  Months in reverse 0 points  Repeat phrase 0 points  Total Score 0    Immunization History  Administered  Date(s) Administered  . Influenza, High Dose Seasonal PF 11/10/2014, 11/17/2015, 11/23/2016, 12/06/2017  . Influenza-Unspecified 10/20/2012, 11/10/2014  . Pneumococcal Conjugate-13 05/12/2013, 08/05/2015  . Tdap 10/19/2010    Qualifies for Shingles Vaccine? Yes . Due for Shingrix. Education has been provided regarding the importance of this vaccine. Pt has been advised to call insurance company to determine out of pocket expense. Advised may also receive vaccine at local pharmacy or Health Dept. Verbalized acceptance and understanding.  Tdap: Up to date  Flu Vaccine: Up to date  Pneumococcal Vaccine: Up to date   Screening Tests Health Maintenance  Topic Date Due  . FOOT EXAM  04/03/2018  . OPHTHALMOLOGY EXAM  06/04/2018  . HEMOGLOBIN A1C  06/07/2018  . TETANUS/TDAP  10/18/2020  . INFLUENZA VACCINE  Completed  . DEXA SCAN  Completed  . PNA vac Low Risk Adult  Completed    Cancer Screenings:  Colorectal Screening:  No longer required.  Mammogram: Completed 08/23/17. Repeat every year. Ordered today. Pt provided with contact information and advised to call to schedule appt.   Bone Density: Completed 04/30/17. Results reflect OSTEOPOROSIS. Repeat every 2 years.   Lung Cancer Screening: (Low Dose CT Chest recommended if Age 51-80 years, 30 pack-year currently smoking OR have quit w/in 15years.) does not qualify.   Additional Screening:  Hepatitis C Screening: no longer required  Vision Screening: Recommended annual ophthalmology exams for early detection of glaucoma and other disorders of the eye. Is the patient up to date with their annual eye exam?  Yes  Who is the provider or what is the name of the office in which the pt attends annual eye exams? Dr. Katy Fitch   Dental Screening: Recommended annual dental exams for proper oral hygiene  Community Resource Referral:  CRR required this visit?  Yes - personal care services and community resources.     Plan:     I have  personally reviewed and addressed the Medicare Annual Wellness questionnaire and have noted the following in the patient's chart:  A. Medical and social history B. Use of alcohol, tobacco or illicit drugs  C. Current medications and supplements D. Functional ability and status E.  Nutritional status F.  Physical activity G. Advance directives H. List of other physicians I.  Hospitalizations, surgeries, and ER visits in previous 12 months J.  [place] such as hearing and vision if needed, cognitive and depression L. Referrals and appointments   In addition, I have reviewed and discussed with patient certain preventive protocols, quality metrics, and best practice recommendations. A written personalized care plan for preventive services as well as general preventive health recommendations were provided to patient.   Signed,  [name], LPN Nurse Health Advisor   Nurse Notes:referral to C3 team for personal care services and community resources such as meals on wheels as patient lives alone and rarely drives. She also stated that her prolia injection is going to cost her $360 tomorrow at the endocrinologist. I  have called [facility] to see about getting her any patient assistance and waiting for a call back. Taylor [name] had forgotten her hearing aids today and had a hard time hearing during our visit but overall she is doing well.

## 2018-04-08 NOTE — Progress Notes (Signed)
Name: Taylor [name]   MRN: [id number]    DOB: January 17, 1933   Date:04/08/2018       Progress Note  Subjective  Chief Complaint  Chief Complaint  Patient presents with  . Medication Refill  . Hypertension  . Depression  . Diabetes  . Hearing Loss  . Iron Deficiency    HPI  DM:she states glucose in am from 150's fasting - she has not skipped any medications latelyShe denies polyphagia, polydipsia or polyuria. Eye exam is up to date foot exam done by Dr. Garry Heater has microalbuminuria and is taking ARB, she also has CKI and is seeing Dr. Candiss Norse now. For  dyslipidemia she is on Lovaza and statin. She denies side effects of medication. HgbA1C today is 7.2%  Hearing loss: very difficulty to hear [place], had to repeat same question multiple times, she forgot her hearing aids today  HTN with CKI stage III: also has secondary hyperparathyroidism, under the care of nephrologist now and states had labs done 2 weeks ago but not sure what type. We will hold off on repeating labs today. No pruritis noticed, good urine output    Patient Active Problem List   Diagnosis Date Noted  . Age-related osteoporosis without current pathological fracture 09/26/2017  . Pain in both hands 09/23/2017  . Hyperparathyroidism ([place]) 08/06/2017  . Chronic kidney disease, stage III (moderate) ([place]) 08/06/2017  . HLD (hyperlipidemia) 07/25/2017  . Osteoporotic compression fracture of spine ([place]) 03/22/2017  . Assistance needed with transportation 03/21/2017  . Iron deficiency anemia 12/19/2016  . Aortic atherosclerosis ([place]) 11/23/2016  . Anterolisthesis 11/23/2016  . Chronic pain syndrome 09/13/2016  . Vitamin D deficiency 08/19/2016  . Anemia, unspecified 08/19/2016  . Primary open angle glaucoma (POAG) of both eyes, severe stage 08/17/2016  . Hypertensive retinopathy 08/17/2016  . Long term current use of opiate analgesic 07/19/2016  . Dyslipidemia associated with type 2 diabetes mellitus ([place]) 04/16/2016  .  Chronic midline low back pain without sciatica 04/16/2016  . Chronic pain of right knee 04/16/2016  . Vaginal dryness 02/22/2015  . Calculus of kidney 11/10/2014  . Osteoarthritis, multiple sites 09/09/2014  . Type 2 diabetes mellitus with microalbuminuria ([place]) 09/09/2014  . Glaucoma 09/09/2014  . BP (high blood pressure) 09/09/2014  . Chronic pain 09/09/2014  . Tinea corporis 09/09/2014  . Umbilical hernia 76/28/3151  . Lump or mass in breast     Past Surgical History:  Procedure Laterality Date  . ABDOMINAL HYSTERECTOMY  1958   menorrhagia  . BACK SURGERY    . BREAST BIOPSY Left 01/25/2012  . CATARACT EXTRACTION, BILATERAL Bilateral    1st 06/07/15 and the 2nd 06/21/15  . CHOLECYSTECTOMY    . COLONOSCOPY  2003   [facility]  . CORONARY ARTERY BYPASS GRAFT Left 01/03/2012,01/24/2012   Left FNA, Left breast Encore bx  . KNEE SURGERY  2009,2011,2012   twice on right and once on left  . KNEE SURGERY    . LIPOMA EXCISION  1998  . [place]  2004  . TONSILLECTOMY     age of 51    Family History  Problem Relation Age of Onset  . Cancer Mother        skin cancer  . Stroke Mother   . Heart disease Father   . Breast cancer Neg Hx     Social History   Socioeconomic History  . Marital status: Divorced    Spouse name: Not on file  . Number of children: 4  .  Years of education: Not on file  . Highest education level: Some college, no degree  Occupational History  . 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 Financial resource strain: Not very hard  . Food insecurity:    Worry: Never true    Inability: Never true  . Transportation needs:    Medical: No    Non-medical: No  Tobacco Use  . Smoking status: Never Smoker  . Smokeless tobacco: Never Used  Substance and Sexual Activity  . Alcohol use: No    Alcohol/week: 0.0 standard drinks  . Drug use: No  . Sexual activity: Not Currently  Lifestyle  . Physical activity:     Days per week: 0 days    Minutes per session: 0 min  . Stress: Not at all  Relationships  . Social connections:    Talks on phone: More than three times a week    Gets together: Twice a week    Attends religious service: More than 4 times per year    Active member of club or organization: Yes    Attends meetings of clubs or organizations: More than 4 times per year    Relationship status: Divorced  . Intimate partner violence:    Fear of current or ex partner: No    Emotionally abused: No    Physically abused: No    Forced sexual activity: No  Other Topics Concern  . Not on file  Social History Narrative   Lives alone   Children do not live in town   Friend helps out: Cira Rue - [phone]   Independent prior to her fall 10/2016     Current Outpatient Medications:  .  acetaminophen (TYLENOL) 500 MG tablet, Take 1 tablet (500 mg total) by mouth 3 (three) times daily. (Patient taking differently: Take 500 mg by mouth as needed. ), Disp: 90 tablet, Rfl: 0 .  amLODipine (NORVASC) 10 MG tablet, TAKE 1 TABLET BY MOUTH EVERY DAY, Disp: 90 tablet, Rfl: 1 .  bimatoprost (LUMIGAN) 0.01 % SOLN, Place 1 drop into both eyes daily. , Disp: , Rfl:  .  brimonidine-timolol (COMBIGAN) 0.2-0.5 % ophthalmic solution, Place 1 drop into both eyes See admin instructions. 1 drop into both eyes in the morning, then 1 drop into both eyes 8 hours later, Disp: , Rfl:  .  Cholecalciferol (VITAMIN D-1000 MAX ST) 1000 UNITS tablet, Take 1,000 Units by mouth daily. Reported on 08/05/2015, Disp: , Rfl:  .  conjugated estrogens (PREMARIN) vaginal cream, Place 1 Applicatorful vaginally daily., Disp: 42.5 g, Rfl: 5 .  famotidine (PEPCID) 20 MG tablet, Take 1 tablet (20 mg total) by mouth daily., Disp: 90 tablet, Rfl: 0 .  ferrous sulfate 325 (65 FE) MG tablet, Take 1 tablet (325 mg total) by mouth daily with breakfast., Disp: 30 tablet, Rfl: 3 .  fluticasone (FLONASE) 50 MCG/ACT nasal spray, SPRAY 2 SPRAYS  INTO EACH NOSTRIL EVERY DAY (Patient taking differently: PRN), Disp: 16 g, Rfl: 1 .  glucose blood test strip, Use as instructed, Disp: 100 each, Rfl: 12 .  metFORMIN (GLUCOPHAGE-XR) 750 MG 24 hr tablet, TAKE 2 TABLETS (1,500 MG TOTAL) BY MOUTH DAILY WITH BREAKFAST., Disp: 180 tablet, Rfl: 1 .  olmesartan (BENICAR) 20 MG tablet, Take 1 tablet (20 mg total) by mouth daily., Disp: 90 tablet, Rfl: 1 .  omega-3 acid ethyl esters (LOVAZA) 1 g capsule, TAKE 1 CAPSULE (1 G TOTAL) BY  MOUTH 2 (TWO) TIMES DAILY., Disp: 180 capsule, Rfl: 1 .  ONETOUCH DELICA LANCETS FINE MISC, 1 each by Does not apply route daily. 30 G Lancets- One Touch Delica, Disp: 932 each, Rfl: 1 .  [START ON 06/03/2018] oxyCODONE-acetaminophen (PERCOCET) 7.5-325 MG tablet, Take 1 tablet by mouth every 8 (eight) hours as needed for up to 30 days for moderate pain or severe pain. (Patient not taking: Reported on 04/08/2018), Disp: 90 tablet, Rfl: 0 .  [START ON 05/04/2018] oxyCODONE-acetaminophen (PERCOCET) 7.5-325 MG tablet, Take 1 tablet by mouth every 8 (eight) hours as needed for up to 30 days for moderate pain or severe pain. (Patient not taking: Reported on 04/08/2018), Disp: 90 tablet, Rfl: 0 .  oxyCODONE-acetaminophen (PERCOCET) 7.5-325 MG tablet, Take 1 tablet by mouth every 8 (eight) hours as needed for up to 30 days for moderate pain or severe pain., Disp: 90 tablet, Rfl: 0 .  pravastatin (PRAVACHOL) 40 MG tablet, TAKE 1 TABLET BY MOUTH EVERYDAY AT BEDTIME, Disp: 90 tablet, Rfl: 1  Allergies  Allergen Reactions  . Ace Inhibitors     cough    I personally reviewed active problem list, medication list, allergies, family history, social history with the patient/caregiver today.   ROS  Constitutional: Negative for fever or weight change.  Respiratory: Negative for cough and shortness of breath.   Cardiovascular: Negative for chest pain or palpitations.  Gastrointestinal: Negative for abdominal pain, no bowel changes.   Musculoskeletal: Positive for gait problem  Neurological: Negative for dizziness or headache.  No other specific complaints in a complete review of systems (except as listed in HPI above).  Objective  Vitals:   04/08/18 1453  BP: 126/68  Pulse: 79  Resp: 16  Temp: 98.1 F (36.7 C)  TempSrc: Oral  SpO2: 98%  Weight: 205 lb 4.8 oz (93.1 kg)  Height: 5\' 1"  (1.549 m)    Body mass index is 38.79 kg/m.  Physical Exam  Constitutional: Patient appears well-developed and well-nourished. Obese  No distress.  HEENT: head atraumatic, normocephalic, pupils equal and reactive to light,  neck supple, throat within normal limits Cardiovascular: Normal rate, regular rhythm and normal heart sounds.  3-6 holosystolic  murmur heard. No BLE edema. Pulmonary/Chest: Effort normal and breath sounds normal. No respiratory distress. Abdominal: Soft.  There is no tenderness. Psychiatric: Patient has a normal mood and affect. behavior is normal. Judgment and thought content normal. Muscular Skeletal: using walker, crepitus with extension of left knee, mild effusion both knees  PHQ2/9: Depression screen [facility] 2/9 04/08/2018 12/06/2017 11/19/2017 09/23/2017 08/05/2017  Decreased Interest 0 0 0 0 1  Down, Depressed, Hopeless 0 0 - 0 0  PHQ - 2 Score 0 0 0 0 1  Altered sleeping - 0 - - 0  Tired, decreased energy - 0 - - 1  Change in appetite - 0 - - 0  Feeling bad or failure about yourself  - 0 - - 0  Trouble concentrating - 0 - - 0  Moving slowly or fidgety/restless - 0 - - 0  Suicidal thoughts - 0 - - 0  PHQ-9 Score - 0 - - 2  Difficult doing work/chores - Not difficult at all - - Not difficult at all    Fall Risk: Fall Risk  04/08/2018 03/31/2018 01/27/2018 11/19/2017 09/23/2017  Falls in the past year? 0 0 0 No No  Comment - - - - -  Number falls in past yr: 0 - - - -  Injury with Fall?  0 - - - -  Risk Factor Category  - - - - -  Risk for fall due to : Impaired balance/gait - - - -  Follow up Falls  prevention discussed - - - -     Assessment & Plan  1. Dyslipidemia associated with type 2 diabetes mellitus (HCC)  - POCT HgB A1C - Urine Microalbumin w/creat. ratio  2. Morbid obesity ([place])  Discussed with the patient the risk posed by an increased BMI. Discussed importance of portion control, calorie counting and at least 150 minutes of physical activity weekly. Avoid sweet beverages and drink more water. Eat at least 6 servings of fruit and vegetables daily   3. Aortic atherosclerosis (HCC)  On statin and aspirin   4. Hyperparathyroidism ([place])  We will hold off on studies since recently seen nephro   5. Chronic kidney disease, stage III (moderate) (HCC)  Keep follow up with nephrologist   6. Benign hypertension with CKD (chronic kidney disease) stage III (HCC)  Under the care of nephrologist   7. Heart murmur  - ECHOCARDIOGRAM COMPLETE; Future

## 2018-04-09 DIAGNOSIS — M810 Age-related osteoporosis without current pathological fracture: Secondary | ICD-10-CM | POA: Diagnosis not present

## 2018-04-09 DIAGNOSIS — E213 Hyperparathyroidism, unspecified: Secondary | ICD-10-CM | POA: Diagnosis not present

## 2018-04-09 DIAGNOSIS — E785 Hyperlipidemia, unspecified: Secondary | ICD-10-CM | POA: Diagnosis not present

## 2018-04-09 DIAGNOSIS — E1169 Type 2 diabetes mellitus with other specified complication: Secondary | ICD-10-CM | POA: Diagnosis not present

## 2018-04-09 DIAGNOSIS — E559 Vitamin D deficiency, unspecified: Secondary | ICD-10-CM | POA: Diagnosis not present

## 2018-04-10 LAB — MICROALBUMIN / CREATININE URINE RATIO
Creatinine, Urine: 86 mg/dL (ref 20–275)
Microalb Creat Ratio: 121 mcg/mg creat — ABNORMAL HIGH (ref <30)
Microalb, Ur: 10.4 mg/dL

## 2018-04-11 NOTE — Addendum Note (Signed)
Addended by: [name] D on: 04/11/2018 01:43 PM   Modules accepted: Orders

## 2018-04-17 DIAGNOSIS — N183 Chronic kidney disease, stage 3 (moderate): Secondary | ICD-10-CM | POA: Diagnosis not present

## 2018-04-17 DIAGNOSIS — I129 Hypertensive chronic kidney disease with stage 1 through stage 4 chronic kidney disease, or unspecified chronic kidney disease: Secondary | ICD-10-CM | POA: Diagnosis not present

## 2018-04-17 DIAGNOSIS — R600 Localized edema: Secondary | ICD-10-CM | POA: Diagnosis not present

## 2018-04-17 DIAGNOSIS — E1129 Type 2 diabetes mellitus with other diabetic kidney complication: Secondary | ICD-10-CM | POA: Diagnosis not present

## 2018-04-18 ENCOUNTER — Other Ambulatory Visit: Payer: Self-pay | Admitting: Family Medicine

## 2018-04-18 NOTE — Telephone Encounter (Signed)
Refill request for general medication: Pepcid 20 mg  Last office visit: 04/08/2018  Last physical exam: 04/08/2018  Follow-ups on file. 08/13/2018

## 2018-04-23 ENCOUNTER — Encounter: Payer: Self-pay | Admitting: Family Medicine

## 2018-04-28 DIAGNOSIS — H401113 Primary open-angle glaucoma, right eye, severe stage: Secondary | ICD-10-CM | POA: Diagnosis not present

## 2018-04-28 DIAGNOSIS — H401122 Primary open-angle glaucoma, left eye, moderate stage: Secondary | ICD-10-CM | POA: Diagnosis not present

## 2018-04-28 DIAGNOSIS — Z961 Presence of intraocular lens: Secondary | ICD-10-CM | POA: Diagnosis not present

## 2018-05-05 ENCOUNTER — Telehealth: Payer: Self-pay

## 2018-05-05 NOTE — Telephone Encounter (Signed)
Copied from [place] [id number]. Topic: Referral - Status >> May 05, 2018  8:28 PM Simone Curia D wrote: 0/04/4915 Spoke with patient about Meals on [employer], [place] transportation and [facility].MA

## 2018-05-05 NOTE — Telephone Encounter (Signed)
Copied from [place] [id number]. Topic: Referral - Status >> May 05, 2018  9:61 PM Simone Curia D wrote: [phone] Spoke with patient about the patient assistance program for Prolia through [facility] prescription drug plan.MA

## 2018-05-20 DIAGNOSIS — M810 Age-related osteoporosis without current pathological fracture: Secondary | ICD-10-CM | POA: Diagnosis not present

## 2018-06-05 ENCOUNTER — Encounter (INDEPENDENT_AMBULATORY_CARE_PROVIDER_SITE_OTHER): Payer: Self-pay | Admitting: Ophthalmology

## 2018-06-10 ENCOUNTER — Other Ambulatory Visit: Payer: Self-pay | Admitting: Family Medicine

## 2018-06-10 DIAGNOSIS — N898 Other specified noninflammatory disorders of vagina: Secondary | ICD-10-CM

## 2018-06-10 NOTE — Telephone Encounter (Signed)
Refill request for general medication: Premarin Vaginal Cream  Last office visit: 04/08/2018  Follow-ups on file. 08/13/2018

## 2018-06-19 ENCOUNTER — Other Ambulatory Visit: Payer: Self-pay

## 2018-06-19 ENCOUNTER — Encounter: Payer: Self-pay | Admitting: Nurse Practitioner

## 2018-06-19 ENCOUNTER — Ambulatory Visit: Payer: Medicare Other | Attending: Nurse Practitioner | Admitting: Nurse Practitioner

## 2018-06-19 DIAGNOSIS — G8929 Other chronic pain: Secondary | ICD-10-CM | POA: Diagnosis not present

## 2018-06-19 DIAGNOSIS — G894 Chronic pain syndrome: Secondary | ICD-10-CM

## 2018-06-19 DIAGNOSIS — M25561 Pain in right knee: Secondary | ICD-10-CM

## 2018-06-19 DIAGNOSIS — M79641 Pain in right hand: Secondary | ICD-10-CM

## 2018-06-19 DIAGNOSIS — M159 Polyosteoarthritis, unspecified: Secondary | ICD-10-CM | POA: Diagnosis not present

## 2018-06-19 DIAGNOSIS — M79642 Pain in left hand: Secondary | ICD-10-CM

## 2018-06-19 MED ORDER — OXYCODONE-ACETAMINOPHEN 7.5-325 MG PO TABS: 1.0000 | ORAL_TABLET | Freq: Three times a day (TID) | ORAL | 0 refills | Status: AC | PRN

## 2018-06-19 MED ORDER — OXYCODONE-ACETAMINOPHEN 7.5-325 MG PO TABS: 1.0000 | ORAL_TABLET | Freq: Three times a day (TID) | ORAL | 0 refills | Status: DC | PRN

## 2018-06-19 NOTE — Patient Instructions (Signed)
____________________________________________________________________________________________  Medication Rules  Purpose: To inform patients, and their family members, of our rules and regulations.  Applies to: All patients receiving prescriptions (written or electronic).  Pharmacy of record: Pharmacy where electronic prescriptions will be sent. If written prescriptions are taken to a different pharmacy, please inform the nursing staff. The pharmacy listed in the electronic medical record should be the one where you would like electronic prescriptions to be sent.  Electronic prescriptions: In compliance with the [place] Strengthen Opioid Misuse Prevention (STOP) Act of 2017 (Session Law 2017-74/H243), effective February 19, 2018, all controlled substances must be electronically prescribed. Calling prescriptions to the pharmacy will cease to exist.  Prescription refills: Only during scheduled appointments. Applies to all prescriptions.  NOTE: The following applies primarily to controlled substances (Opioid* Pain Medications).   Patient's responsibilities: 1. Pain Pills: Bring all pain pills to every appointment (except for procedure appointments). 2. Pill Bottles: Bring pills in original pharmacy bottle. Always bring the newest bottle. Bring bottle, even if empty. 3. Medication refills: You are responsible for knowing and keeping track of what medications you take and those you need refilled. The day before your appointment: write a list of all prescriptions that need to be refilled. The day of the appointment: give the list to the admitting nurse. Prescriptions will be written only during appointments. No prescriptions will be written on procedure days. If you forget a medication: it will not be "Called in", "Faxed", or "electronically sent". You will need to get another appointment to get these prescribed. No early refills. Do not call asking to have your prescription filled  early. 4. Prescription Accuracy: You are responsible for carefully inspecting your prescriptions before leaving our office. Have the discharge nurse carefully go over each prescription with you, before taking them home. Make sure that your name is accurately spelled, that your address is correct. Check the name and dose of your medication to make sure it is accurate. Check the number of pills, and the written instructions to make sure they are clear and accurate. Make sure that you are given enough medication to last until your next medication refill appointment. 5. Taking Medication: Take medication as prescribed. When it comes to controlled substances, taking less pills or less frequently than prescribed is permitted and encouraged. Never take more pills than instructed. Never take medication more frequently than prescribed.  6. Inform other Doctors: Always inform, all of your healthcare providers, of all the medications you take. 7. Pain Medication from other Providers: You are not allowed to accept any additional pain medication from any other Doctor or Healthcare provider. There are two exceptions to this rule. (see below) In the event that you require additional pain medication, you are responsible for notifying us, as stated below. 8. Medication Agreement: You are responsible for carefully reading and following our Medication Agreement. This must be signed before receiving any prescriptions from our practice. Safely store a copy of your signed Agreement. Violations to the Agreement will result in no further prescriptions. (Additional copies of our Medication Agreement are available upon request.) 9. Laws, Rules, & Regulations: All patients are expected to follow all Federal and State Laws, Statutes, Rules, & Regulations. Ignorance of the Laws does not constitute a valid excuse. The use of any illegal substances is prohibited. 10. Adopted CDC guidelines & recommendations: Target dosing levels will be  at or below 60 MME/day. Use of benzodiazepines** is not recommended.  Exceptions: There are only two exceptions to the rule of not   receiving pain medications from other Healthcare Providers. 1. Exception #1 (Emergencies): In the event of an emergency (i.e.: accident requiring emergency care), you are allowed to receive additional pain medication. However, you are responsible for: As soon as you are able, call our office [phone], at any time of the day or night, and leave a message stating your name, the date and nature of the emergency, and the name and dose of the medication prescribed. In the event that your call is answered by a member of our staff, make sure to document and save the date, time, and the name of the person that took your information.  2. Exception #2 (Planned Surgery): In the event that you are scheduled by another doctor or dentist to have any type of surgery or procedure, you are allowed (for a period no longer than 30 days), to receive additional pain medication, for the acute post-op pain. However, in this case, you are responsible for picking up a copy of our "Post-op Pain Management for Surgeons" handout, and giving it to your surgeon or dentist. This document is available at our office, and does not require an appointment to obtain it. Simply go to our office during business hours (Monday-Thursday from 8:00 AM to 4:00 PM) (Friday 8:00 AM to 12:00 Noon) or if you have a scheduled appointment with us, prior to your surgery, and ask for it by name. In addition, you will need to provide us with your name, name of your surgeon, type of surgery, and date of procedure or surgery.  *Opioid medications include: morphine, codeine, oxycodone, oxymorphone, hydrocodone, hydromorphone, meperidine, tramadol, tapentadol, buprenorphine, fentanyl, methadone. **Benzodiazepine medications include: diazepam (Valium), alprazolam (Xanax), clonazepam (Klonopine), lorazepam (Ativan), clorazepate  (Tranxene), chlordiazepoxide (Librium), estazolam (Prosom), oxazepam (Serax), temazepam (Restoril), triazolam (Halcion) (Last updated: 04/18/2017) ____________________________________________________________________________________________    

## 2018-06-19 NOTE — Progress Notes (Signed)
Pain Management Encounter Note - Virtual Visit via Telephone Telehealth (real-time audio visits between healthcare provider and patient).  Patient's Phone No. & Preferred Pharmacy:  [phone] (home); There is no such number on file (mobile).; (Preferred) [phone]  [id number] - [place], [place] - [address] [address] Phone: [phone] Fax: [phone]   Pre-screening note:  Our staff contacted Taylor [name] and offered her an "in person", "face-to-face" appointment versus a telephone encounter. She indicated preferring the telephone encounter, at this time.  Reason for Virtual Visit: COVID-19*  Social distancing based on CDC and AMA recommendations.   I contacted Taylor [name] on 06/19/2018 at 9:30 AM by telephone and clearly identified myself as Dionisio David, NP. I verified that I was speaking with the correct person using two identifiers (Name and date of birth: 06-10-1932).  Advanced Informed Consent I sought verbal advanced consent from Taylor [name] for telemedicine interactions and virtual visit. I informed Taylor [name] of the security and privacy concerns, risks, and limitations associated with performing an evaluation and management service by telephone. I also informed Taylor [name] of the availability of "in person" appointments and I informed her of the possibility of a patient responsible charge related to this service. Taylor [name] expressed understanding and agreed to proceed.   Historic Elements   Taylor [name] is a 85 y.o. year old, female patient evaluated today after her last encounter by our practice on 03/31/2018. Taylor [name]  has a past medical history of Abnormal mammogram, unspecified (2013), Breast screening, unspecified (2013), Diabetes mellitus without complication ([place]) (1884), GERD (gastroesophageal reflux disease), Glaucoma (2003), Gout, Hyperlipidemia (2008), Hypertension (1980's), Lump or mass in breast (01/03/2012), Obesity,  unspecified (2013), Osteoporosis, Shingles (2013), and Special screening for malignant neoplasms, colon (2013). She also  has a past surgical history that includes Abdominal hysterectomy (1958); Knee surgery (2009,2011,2012); Lipoma excision (1998); Spine surgery (2004); Tonsillectomy; Colonoscopy (2003); Coronary artery bypass graft (Left, 01/03/2012,01/24/2012); Cataract extraction, bilateral (Bilateral); Cholecystectomy; Back surgery; Knee surgery; and Breast biopsy (Left, 01/25/2012). Taylor [name] has a current medication list which includes the following prescription(s): acetaminophen, amlodipine, bimatoprost, brimonidine-timolol, cholecalciferol, famotidine, ferrous sulfate, fluticasone, glucose blood, metformin, olmesartan, omega-3 acid ethyl esters, onetouch delica lancets fine, oxycodone-acetaminophen, pravastatin, and premarin. She  reports that she has never smoked. She has never used smokeless tobacco. She reports that she does not drink alcohol or use drugs. Taylor [name] is allergic to ace inhibitors.   HPI  I last saw her on 03/31/2018. She is being evaluated for medication management. She has 6/10 right knee and finger pain. She is also having some low back pain however it is not as bad. She is having swelling and occasional weakness in her knee. She feels like the weakness goes and comes. She has some swelling in her fingers. She does not feel like the pain is changing. She admits that she takes her medication and this claims the pain down. She denies any side effects.   Pharmacotherapy Assessment  Analgesic:Oxycodone/acetaminophen 7.5/325 mg 3 times daily MME/day:33.75mg /day.  Monitoring: Pharmacotherapy: No side-effects or adverse reactions reported. [place] PMP: PDMP not reviewed this encounter.       Compliance: No problems identified. Plan: Refer to "POC".  Review of recent tests  DG Knee 1-2 Views Right CLINICAL DATA:  Chronic knee pain  EXAM: RIGHT KNEE - 1-2 VIEW  COMPARISON:   06/17/2015  FINDINGS: Prior right knee replacement with intact hardware and normal alignment. No acute fracture.  No significant knee effusion.  IMPRESSION: Right knee replacement without acute osseous abnormality.  Electronically Signed   By: Donavan Foil M.D.   On: 03/31/2018 22:24   Office Visit on 04/08/2018  Component Date Value Ref Range Status  . HbA1c, POC (controlled diabetic ra* 04/08/2018 7.2* 0.0 - 7.0 % Final  . Creatinine, Urine 04/09/2018 86  20 - 275 mg/dL Final  . Microalb, Ur 04/09/2018 10.4  mg/dL Final   Comment: Reference Range Not established   . Microalb Creat Ratio 04/09/2018 121* <30 mcg/mg creat Final   Comment: . The ADA defines abnormalities in albumin excretion as follows: [name] Category         Result (mcg/mg creatinine) . Normal                    <30 Microalbuminuria         30-299  Clinical albuminuria   > OR = 300 . The ADA recommends that at least two of three specimens collected within a 3-6 month period be abnormal before considering a patient to be within a diagnostic category.    Assessment  There were no encounter diagnoses.  Plan of Care  I am having Taylor [name] maintain her bimatoprost, Cholecalciferol, brimonidine-timolol, acetaminophen, OneTouch Delica Lancets Fine, fluticasone, amLODipine, omega-3 acid ethyl esters, pravastatin, metFORMIN, olmesartan, ferrous sulfate, oxyCODONE-acetaminophen, glucose blood, famotidine, and Premarin.  Pharmacotherapy (Medications Ordered): No orders of the defined types were placed in this encounter.  Orders:  No orders of the defined types were placed in this encounter.  Follow-up plan:   No follow-ups on file.   I discussed the assessment and treatment plan with the patient. The patient was provided an opportunity to ask questions and all were answered. The patient agreed with the plan and demonstrated an understanding of the instructions.  Patient advised to call back or seek an  in-person evaluation if the symptoms or condition worsens.  Total duration of non-face-to-face encounter: 12 minutes.  Note by: Dionisio David, NP Date: 06/19/2018; Time: 9:30 AM  Disclaimer:  * Given the special circumstances of the COVID-19 pandemic, the federal government has announced that the Office for Civil Rights (OCR) will exercise its enforcement discretion and will not impose penalties on physicians using telehealth in the event of noncompliance with regulatory requirements under the [place] (HIPAA) in connection with the good faith provision of telehealth during the OIZTI-45 national public health emergency. ([place])

## 2018-06-26 ENCOUNTER — Other Ambulatory Visit: Payer: Self-pay | Admitting: Family Medicine

## 2018-06-26 DIAGNOSIS — E782 Mixed hyperlipidemia: Secondary | ICD-10-CM

## 2018-06-26 NOTE — Telephone Encounter (Signed)
[facility]ill request for general medication. [place] [facility]   Last office visit 04/08/2018   Follow up on 08/13/2018

## 2018-06-29 ENCOUNTER — Other Ambulatory Visit: Payer: Self-pay | Admitting: Family Medicine

## 2018-06-29 DIAGNOSIS — E7800 Pure hypercholesterolemia, unspecified: Secondary | ICD-10-CM

## 2018-06-29 DIAGNOSIS — I10 Essential (primary) hypertension: Secondary | ICD-10-CM

## 2018-07-17 DIAGNOSIS — M79674 Pain in right toe(s): Secondary | ICD-10-CM | POA: Diagnosis not present

## 2018-07-17 DIAGNOSIS — M79675 Pain in left toe(s): Secondary | ICD-10-CM | POA: Diagnosis not present

## 2018-07-17 DIAGNOSIS — B351 Tinea unguium: Secondary | ICD-10-CM | POA: Diagnosis not present

## 2018-07-23 DIAGNOSIS — H401113 Primary open-angle glaucoma, right eye, severe stage: Secondary | ICD-10-CM | POA: Diagnosis not present

## 2018-07-23 DIAGNOSIS — H401122 Primary open-angle glaucoma, left eye, moderate stage: Secondary | ICD-10-CM | POA: Diagnosis not present

## 2018-07-24 ENCOUNTER — Other Ambulatory Visit: Payer: Self-pay | Admitting: Family Medicine

## 2018-07-24 DIAGNOSIS — D649 Anemia, unspecified: Secondary | ICD-10-CM

## 2018-07-24 DIAGNOSIS — R790 Abnormal level of blood mineral: Secondary | ICD-10-CM

## 2018-07-24 NOTE — Telephone Encounter (Signed)
Refill request for general medication. Iron tablets    Last office visit 04/08/2018   Follow up on 08/13/2018

## 2018-08-04 ENCOUNTER — Other Ambulatory Visit: Payer: Self-pay

## 2018-08-04 ENCOUNTER — Encounter (INDEPENDENT_AMBULATORY_CARE_PROVIDER_SITE_OTHER): Payer: Medicare Other | Admitting: Ophthalmology

## 2018-08-04 ENCOUNTER — Encounter (INDEPENDENT_AMBULATORY_CARE_PROVIDER_SITE_OTHER): Payer: Self-pay | Admitting: Ophthalmology

## 2018-08-04 DIAGNOSIS — H35372 Puckering of macula, left eye: Secondary | ICD-10-CM

## 2018-08-04 DIAGNOSIS — I10 Essential (primary) hypertension: Secondary | ICD-10-CM | POA: Diagnosis not present

## 2018-08-04 DIAGNOSIS — H35342 Macular cyst, hole, or pseudohole, left eye: Secondary | ICD-10-CM | POA: Diagnosis not present

## 2018-08-04 DIAGNOSIS — H43813 Vitreous degeneration, bilateral: Secondary | ICD-10-CM | POA: Diagnosis not present

## 2018-08-04 DIAGNOSIS — H35033 Hypertensive retinopathy, bilateral: Secondary | ICD-10-CM | POA: Diagnosis not present

## 2018-08-08 ENCOUNTER — Other Ambulatory Visit: Payer: Self-pay | Admitting: Family Medicine

## 2018-08-08 DIAGNOSIS — E1129 Type 2 diabetes mellitus with other diabetic kidney complication: Secondary | ICD-10-CM

## 2018-08-08 NOTE — Telephone Encounter (Signed)
Request for diabetes medication. Metformin [facility]  Last office visit pertaining to diabetes: 04/08/2018   Lab Results  Component Value Date   HGBA1C 7.2 (A) 04/08/2018     Follow up on 08/15/2018

## 2018-08-13 ENCOUNTER — Encounter: Payer: Self-pay | Admitting: Family Medicine

## 2018-08-13 ENCOUNTER — Other Ambulatory Visit: Payer: Self-pay

## 2018-08-13 ENCOUNTER — Ambulatory Visit (INDEPENDENT_AMBULATORY_CARE_PROVIDER_SITE_OTHER): Payer: Medicare Other | Admitting: Family Medicine

## 2018-08-13 VITALS — BP 138/84 | HR 85 | Temp 98.4°F | Resp 16 | Ht 61.0 in | Wt 196.1 lb

## 2018-08-13 DIAGNOSIS — N183 Chronic kidney disease, stage 3 unspecified: Secondary | ICD-10-CM

## 2018-08-13 DIAGNOSIS — E213 Hyperparathyroidism, unspecified: Secondary | ICD-10-CM

## 2018-08-13 DIAGNOSIS — E1169 Type 2 diabetes mellitus with other specified complication: Secondary | ICD-10-CM | POA: Diagnosis not present

## 2018-08-13 DIAGNOSIS — E785 Hyperlipidemia, unspecified: Secondary | ICD-10-CM

## 2018-08-13 DIAGNOSIS — I10 Essential (primary) hypertension: Secondary | ICD-10-CM | POA: Diagnosis not present

## 2018-08-13 DIAGNOSIS — I129 Hypertensive chronic kidney disease with stage 1 through stage 4 chronic kidney disease, or unspecified chronic kidney disease: Secondary | ICD-10-CM

## 2018-08-13 DIAGNOSIS — I700 Atherosclerosis of aorta: Secondary | ICD-10-CM

## 2018-08-13 DIAGNOSIS — E7800 Pure hypercholesterolemia, unspecified: Secondary | ICD-10-CM | POA: Diagnosis not present

## 2018-08-13 DIAGNOSIS — K219 Gastro-esophageal reflux disease without esophagitis: Secondary | ICD-10-CM

## 2018-08-13 DIAGNOSIS — Z748 Other problems related to care provider dependency: Secondary | ICD-10-CM

## 2018-08-13 LAB — POCT GLYCOSYLATED HEMOGLOBIN (HGB A1C): HbA1c, POC (controlled diabetic range): 6.6 % (ref 0.0–7.0)

## 2018-08-13 MED ORDER — AMLODIPINE BESYLATE 10 MG PO TABS: 10.0000 mg | ORAL_TABLET | Freq: Every day | ORAL | 1 refills | Status: DC

## 2018-08-13 MED ORDER — FAMOTIDINE 20 MG PO TABS: 20.0000 mg | ORAL_TABLET | Freq: Every day | ORAL | 1 refills | Status: DC

## 2018-08-13 MED ORDER — PRAVASTATIN SODIUM 40 MG PO TABS: 40.0000 mg | ORAL_TABLET | Freq: Every day | ORAL | 1 refills | Status: DC

## 2018-08-13 NOTE — Progress Notes (Signed)
Name: Taylor [name]   MRN: [id number]    DOB: 03-22-1932   Date:08/13/2018       Progress Note  Subjective  Chief Complaint  Chief Complaint  Patient presents with  . Medication Refill  . Diabetes    Checks two to three times weekly BS Average-150  . Hypertension    Denies any symptoms  . Osteoporosis  . Iron Deficiency  . Hyperlipidemia  . Gastroesophageal Reflux    Well controlled with medication daily    HPI  UX:NATFTDDUK glucose in am from 150's fasting - she has not skipped any medications lately, taking metformin 750 discussed recall of some batches She denies polyphagia, polydipsia or polyuria. Eye exam is up to date foot exam today, she also sees  microalbuminuria and is taking ARB, she also has CKI and is seeing Dr. Candiss Norse now. For  dyslipidemia she is on Lovaza and statin. She denies side effects of medication. HgbA1C today at 6.6 % last time was 7.2% Doing well, no hypoglycemic episodes   Hearing loss: very difficulty to hear [place], had to repeat same question multiple times, she forgot her hearing aids again today  HTN with CKI stage III: also has secondary hyperparathyroidism, under the care of nephrologist, Dr. Candiss Norse. No pruritis noticed, good urine output   Chronic pain: she has chronic right knee pain, under the care of pain clinic and uses a walker to assist with ambulation   GERD: well controlled with Pepcid, no heartburn or indigestion  Morbid obesity: BMI above 35 with co-morbidities, she is not very compliant with diabetic diet, but has a diabetic meal from meals on wheels.   Atherosclerosis aorta: on statin therapy not on aspirin but high risk of falls   Osteoporosis: seeing Dr. Honor Junes for Prolia and is tolerating it well.    Patient Active Problem List   Diagnosis Date Noted  . Morbid obesity ([place]) 04/08/2018  . Age-related osteoporosis without current pathological fracture 09/26/2017  . Pain in both hands 09/23/2017  . Hyperparathyroidism  ([place]) 08/06/2017  . Chronic kidney disease, stage III (moderate) ([place]) 08/06/2017  . HLD (hyperlipidemia) 07/25/2017  . Osteoporotic compression fracture of spine ([place]) 03/22/2017  . Assistance needed with transportation 03/21/2017  . Iron deficiency anemia 12/19/2016  . Aortic atherosclerosis ([place]) 11/23/2016  . Anterolisthesis 11/23/2016  . Chronic pain syndrome 09/13/2016  . Vitamin D deficiency 08/19/2016  . Anemia, unspecified 08/19/2016  . Primary open angle glaucoma (POAG) of both eyes, severe stage 08/17/2016  . Hypertensive retinopathy 08/17/2016  . Long term current use of opiate analgesic 07/19/2016  . Dyslipidemia associated with type 2 diabetes mellitus ([place]) 04/16/2016  . Chronic midline low back pain without sciatica 04/16/2016  . Chronic pain of right knee 04/16/2016  . Vaginal dryness 02/22/2015  . Calculus of kidney 11/10/2014  . Osteoarthritis, multiple sites 09/09/2014  . Type 2 diabetes mellitus with microalbuminuria ([place]) 09/09/2014  . Glaucoma 09/09/2014  . Benign hypertension with CKD (chronic kidney disease) stage III ([place]) 09/09/2014  . Chronic pain 09/09/2014  . Tinea corporis 09/09/2014  . Umbilical hernia 02/54/2706  . Lump or mass in breast     Past Surgical History:  Procedure Laterality Date  . ABDOMINAL HYSTERECTOMY  1958   menorrhagia  . BACK SURGERY    . BREAST BIOPSY Left 01/25/2012  . CATARACT EXTRACTION, BILATERAL Bilateral    1st 06/07/15 and the 2nd 06/21/15  . CHOLECYSTECTOMY    . COLONOSCOPY  2003   [facility]  . CORONARY  ARTERY BYPASS GRAFT Left 01/03/2012,01/24/2012   Left FNA, Left breast [place] bx  . KNEE SURGERY  2009,2011,2012   twice on right and once on left  . KNEE SURGERY    . LIPOMA EXCISION  1998  . [place]  2004  . TONSILLECTOMY     age of 26    Family History  Problem Relation Age of Onset  . Cancer Mother        skin cancer  . Stroke Mother   . Heart disease Father   . Breast cancer Neg Hx     Social  History   Socioeconomic History  . Marital status: Divorced    Spouse name: Not on file  . Number of children: 4  . Years of education: Not on file  . Highest education level: Some college, no degree  Occupational History  . 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 Financial resource strain: Not very hard  . Food insecurity    Worry: Never true    Inability: Never true  . Transportation needs    Medical: No    Non-medical: No  Tobacco Use  . Smoking status: Never Smoker  . Smokeless tobacco: Never Used  Substance and Sexual Activity  . Alcohol use: No    Alcohol/week: 0.0 standard drinks  . Drug use: No  . Sexual activity: Not Currently  Lifestyle  . Physical activity    Days per week: 0 days    Minutes per session: 0 min  . Stress: Not at all  Relationships  . Social connections    Talks on phone: More than three times a week    Gets together: Twice a week    Attends religious service: More than 4 times per year    Active member of club or organization: Yes    Attends meetings of clubs or organizations: More than 4 times per year    Relationship status: Divorced  . Intimate partner violence    Fear of current or ex partner: No    Emotionally abused: No    Physically abused: No    Forced sexual activity: No  Other Topics Concern  . Not on file  Social History Narrative   Lives alone   Children do not live in town   Friend helps out: Cira Rue - [phone]   Independent prior to her fall 10/2016     Current Outpatient Medications:  .  acetaminophen (TYLENOL) 500 MG tablet, Take 1 tablet (500 mg total) by mouth 3 (three) times daily. (Patient taking differently: Take 500 mg by mouth as needed. ), Disp: 90 tablet, Rfl: 0 .  amLODipine (NORVASC) 10 MG tablet, TAKE 1 TABLET BY MOUTH EVERY DAY, Disp: 90 tablet, Rfl: 0 .  AZOPT 1 % ophthalmic suspension, INSTILL 1 DROP INTO BOTH EYES TWICE A DAY, Disp: , Rfl:   .  bimatoprost (LUMIGAN) 0.01 % SOLN, Place 1 drop into both eyes daily. , Disp: , Rfl:  .  brimonidine-timolol (COMBIGAN) 0.2-0.5 % ophthalmic solution, Place 1 drop into both eyes See admin instructions. 1 drop into both eyes in the morning, then 1 drop into both eyes 8 hours later, Disp: , Rfl:  .  Calcium Carbonate-Vitamin D (OYSTER SHELL CALCIUM 500 + D PO), Take 1 tablet by mouth 2 (two) times a day., Disp: , Rfl:  .  famotidine (PEPCID) 20 MG tablet, TAKE 1 TABLET BY MOUTH EVERY DAY,  Disp: 90 tablet, Rfl: 1 .  ferrous sulfate 325 (65 FE) MG tablet, TAKE 1 TABLET BY MOUTH EVERY DAY WITH BREAKFAST, Disp: 90 tablet, Rfl: 1 .  fluticasone (FLONASE) 50 MCG/ACT nasal spray, SPRAY 2 SPRAYS INTO EACH NOSTRIL EVERY DAY (Patient taking differently: PRN), Disp: 16 g, Rfl: 1 .  glucose blood test strip, Use as instructed, Disp: 100 each, Rfl: 12 .  metFORMIN (GLUCOPHAGE-XR) 750 MG 24 hr tablet, TAKE 2 TABLETS (1,500 MG TOTAL) BY MOUTH DAILY WITH BREAKFAST., Disp: 180 tablet, Rfl: 1 .  olmesartan (BENICAR) 20 MG tablet, Take 1 tablet (20 mg total) by mouth daily., Disp: 90 tablet, Rfl: 1 .  omega-3 acid ethyl esters (LOVAZA) 1 g capsule, TAKE 1 CAPSULE (1 G TOTAL) BY MOUTH 2 (TWO) TIMES DAILY., Disp: 180 capsule, Rfl: 0 .  ONETOUCH DELICA LANCETS FINE MISC, 1 each by Does not apply route daily. 30 G Lancets- One Touch Delica, Disp: 440 each, Rfl: 1 .  [START ON 09/01/2018] oxyCODONE-acetaminophen (PERCOCET) 7.5-325 MG tablet, Take 1 tablet by mouth every 8 (eight) hours as needed for up to 30 days for moderate pain or severe pain., Disp: 90 tablet, Rfl: 0 .  oxyCODONE-acetaminophen (PERCOCET) 7.5-325 MG tablet, Take 1 tablet by mouth every 8 (eight) hours as needed for up to 30 days for moderate pain or severe pain., Disp: 90 tablet, Rfl: 0 .  pravastatin (PRAVACHOL) 40 MG tablet, TAKE 1 TABLET BY MOUTH EVERYDAY AT BEDTIME, Disp: 90 tablet, Rfl: 0 .  PREMARIN vaginal cream, PLACE 1 APPLICATORFUL VAGINALLY  DAILY., Disp: 30 g, Rfl: 5 .  Cholecalciferol (VITAMIN D-1000 MAX ST) 1000 UNITS tablet, Take 1,000 Units by mouth daily. Reported on 08/05/2015, Disp: , Rfl:   Allergies  Allergen Reactions  . Ace Inhibitors     cough    I personally reviewed active problem list, medication list, allergies, family history, social history with the patient/caregiver today.   ROS  Ten systems reviewed and is negative except as mentioned in HPI   Objective  Vitals:   08/13/18 1435  BP: 138/84  Pulse: 85  Resp: 16  Temp: 98.4 F (36.9 C)  TempSrc: Oral  SpO2: 99%  Weight: 196 lb 1.6 oz (89 kg)  Height: 5\' 1"  (1.549 m)    Body mass index is 37.05 kg/m.  Physical Exam  Constitutional: Patient appears well-developed and well-nourished. Obese  No distress.  HEENT: head atraumatic, normocephalic, pupils equal and reactive to light, neck supple, oral mucosa not done Cardiovascular: Normal rate, regular rhythm and normal heart sounds.  No murmur heard. No BLE edema. Pulmonary/Chest: Effort normal and breath sounds normal. No respiratory distress. Abdominal: Soft.  There is no tenderness. Muscular Skeletal: uses a walker, slow gait Psychiatric: Patient has a normal mood and affect. behavior is normal. Judgment and thought content normal.  Recent Results (from the past 2160 hour(s))  POCT HgB A1C     Status: None   Collection Time: 08/13/18  2:51 PM  Result Value Ref Range   Hemoglobin A1C     HbA1c POC (<> result, manual entry)     HbA1c, POC (prediabetic range)     HbA1c, POC (controlled diabetic range) 6.6 0.0 - 7.0 %    Diabetic Foot Exam: Diabetic Foot Exam - Simple   Simple Foot Form Diabetic Foot exam was performed with the following findings: Yes 08/13/2018  3:02 PM  Visual Inspection See comments: Yes Sensation Testing Intact to touch and monofilament testing bilaterally: Yes Pulse Check  Posterior Tibialis and Dorsalis pulse intact bilaterally: Yes Comments Thick toe nails       PHQ2/9: Depression screen [name] 2/9 08/13/2018 04/08/2018 12/06/2017 11/19/2017 09/23/2017  Decreased Interest 0 0 0 0 0  Down, Depressed, Hopeless 0 0 0 - 0  PHQ - 2 Score 0 0 0 0 0  Altered sleeping 0 - 0 - -  Tired, decreased energy 1 - 0 - -  Change in appetite 1 - 0 - -  Feeling bad or failure about yourself  0 - 0 - -  Trouble concentrating 0 - 0 - -  Moving slowly or fidgety/restless 0 - 0 - -  Suicidal thoughts 0 - 0 - -  PHQ-9 Score 2 - 0 - -  Difficult doing work/chores Not difficult at all - Not difficult at all - -  Some recent data might be hidden    phq 9 is negative   Fall Risk: Fall Risk  08/13/2018 04/08/2018 03/31/2018 01/27/2018 11/19/2017  Falls in the past year? 0 0 0 0 No  Comment - - - - -  Number falls in past yr: 0 0 - - -  Injury with Fall? 0 0 - - -  Risk Factor Category  - - - - -  Risk for fall due to : - Impaired balance/gait - - -  Follow up - Falls prevention discussed - - -    Functional Status Survey: Is the patient deaf or have difficulty hearing?: Yes Does the patient have difficulty seeing, even when wearing glasses/contacts?: Yes Does the patient have difficulty concentrating, remembering, or making decisions?: No Does the patient have difficulty walking or climbing stairs?: Yes Does the patient have difficulty dressing or bathing?: No Does the patient have difficulty doing errands alone such as visiting a doctor's office or shopping?: No    Assessment & Plan  1. Dyslipidemia associated with type 2 diabetes mellitus (HCC)  - POCT HgB A1C - amLODipine (NORVASC) 10 MG tablet; Take 1 tablet (10 mg total) by mouth daily.  Dispense: 90 tablet; Refill: 1  2. Essential hypertension  At goal   3. Pure hypercholesterolemia  - pravastatin (PRAVACHOL) 40 MG tablet; Take 1 tablet (40 mg total) by mouth daily.  Dispense: 90 tablet; Refill: 1  4. Aortic atherosclerosis ([place])  On statin therapy   5. Morbid obesity ([place])  Discussed life  style modification   6. Hyperparathyroidism ([place])   7. Chronic kidney disease, stage III (moderate) (HCC)  Keep follow up with Dr. Candiss Norse   8. Benign hypertension with CKD (chronic kidney disease) stage III (HCC)  Doing well   9. Assistance needed with transportation  Using walker, cousin  brought her in  44. GERD without esophagitis  - famotidine (PEPCID) 20 MG tablet; Take 1 tablet (20 mg total) by mouth daily.  Dispense: 90 tablet; Refill: 1

## 2018-08-18 ENCOUNTER — Telehealth: Payer: Self-pay | Admitting: Family Medicine

## 2018-08-18 NOTE — Telephone Encounter (Signed)
Pt received a letter from pharmacy stating her metformin has been recalled. Please advise pt

## 2018-08-18 NOTE — Telephone Encounter (Signed)
Informed patient to call pharmacy and see if her lot and batch were impacted. If it was they have to take her old bottle and give her a manufacture metformin that has not been recalled.

## 2018-09-10 ENCOUNTER — Other Ambulatory Visit: Payer: Self-pay | Admitting: Family Medicine

## 2018-09-10 ENCOUNTER — Other Ambulatory Visit: Payer: Self-pay

## 2018-09-10 ENCOUNTER — Ambulatory Visit
Admission: RE | Admit: 2018-09-10 | Discharge: 2018-09-10 | Disposition: A | Discharge status: Home / Self Care | Payer: Medicare Other | Source: Ambulatory Visit | Attending: Family Medicine | Admitting: Family Medicine

## 2018-09-10 DIAGNOSIS — Z1231 Encounter for screening mammogram for malignant neoplasm of breast: Secondary | ICD-10-CM | POA: Diagnosis not present

## 2018-09-10 DIAGNOSIS — R928 Other abnormal and inconclusive findings on diagnostic imaging of breast: Secondary | ICD-10-CM

## 2018-09-10 DIAGNOSIS — N6310 Unspecified lump in the right breast, unspecified quadrant: Secondary | ICD-10-CM

## 2018-09-19 ENCOUNTER — Other Ambulatory Visit: Payer: Self-pay | Admitting: Family Medicine

## 2018-09-19 DIAGNOSIS — E782 Mixed hyperlipidemia: Secondary | ICD-10-CM

## 2018-09-19 NOTE — Telephone Encounter (Signed)
Refill request for general medication. Lovaza   Last office visit 08/13/2018   Follow up on 12/15/2018

## 2018-09-23 ENCOUNTER — Other Ambulatory Visit: Payer: Self-pay | Admitting: Family Medicine

## 2018-09-23 DIAGNOSIS — I10 Essential (primary) hypertension: Secondary | ICD-10-CM

## 2018-09-24 ENCOUNTER — Encounter: Payer: Self-pay | Admitting: Anesthesiology

## 2018-09-24 ENCOUNTER — Ambulatory Visit: Payer: Medicare Other | Attending: Anesthesiology | Admitting: Anesthesiology

## 2018-09-24 ENCOUNTER — Other Ambulatory Visit: Payer: Self-pay

## 2018-09-24 DIAGNOSIS — M545 Low back pain, unspecified: Secondary | ICD-10-CM

## 2018-09-24 DIAGNOSIS — M25561 Pain in right knee: Secondary | ICD-10-CM

## 2018-09-24 DIAGNOSIS — M79642 Pain in left hand: Secondary | ICD-10-CM

## 2018-09-24 DIAGNOSIS — G894 Chronic pain syndrome: Secondary | ICD-10-CM | POA: Diagnosis not present

## 2018-09-24 DIAGNOSIS — Z79891 Long term (current) use of opiate analgesic: Secondary | ICD-10-CM

## 2018-09-24 DIAGNOSIS — M79641 Pain in right hand: Secondary | ICD-10-CM | POA: Diagnosis not present

## 2018-09-24 DIAGNOSIS — M159 Polyosteoarthritis, unspecified: Secondary | ICD-10-CM

## 2018-09-24 DIAGNOSIS — G8929 Other chronic pain: Secondary | ICD-10-CM

## 2018-09-24 MED ORDER — OXYCODONE-ACETAMINOPHEN 7.5-325 MG PO TABS: 1.0000 | ORAL_TABLET | Freq: Three times a day (TID) | ORAL | 0 refills | Status: AC | PRN

## 2018-09-24 MED ORDER — OXYCODONE-ACETAMINOPHEN 7.5-325 MG PO TABS: 1.0000 | ORAL_TABLET | Freq: Three times a day (TID) | ORAL | 0 refills | Status: DC | PRN

## 2018-09-24 NOTE — Progress Notes (Signed)
Virtual Visit via Telephone Note  I connected with Taylor [name] on 09/24/18 at  3:15 PM EDT by telephone and verified that I am speaking with the correct person using two identifiers.  Location: Patient: Home Provider: Pain control center   I discussed the limitations, risks, security and privacy concerns of performing an evaluation and management service by telephone and the availability of in person appointments. I also discussed with the patient that there may be a patient responsible charge related to this service. The patient expressed understanding and agreed to proceed.   History of Present Illness: I spoke with Taylor [name] today regarding her intermittent low back pain and right knee pain.  She continues to also have hand pain but is taking her Percocet 7.5 mg tablets successfully and these continue to give her good relief.  The quality characteristic and distribution of the pain that she has described a stable in nature.  No new changes in lower extremity strength or function are noted.  Based on our discussion she continues to derive functional improvement with the medications and no side effects.  Otherwise she is in her usual state of health at this time.    Observations/Objective:  Current Outpatient Medications:  .  acetaminophen (TYLENOL) 500 MG tablet, Take 1 tablet (500 mg total) by mouth 3 (three) times daily. (Patient taking differently: Take 500 mg by mouth as needed. ), Disp: 90 tablet, Rfl: 0 .  amLODipine (NORVASC) 10 MG tablet, Take 1 tablet (10 mg total) by mouth daily., Disp: 90 tablet, Rfl: 1 .  AZOPT 1 % ophthalmic suspension, INSTILL 1 DROP INTO BOTH EYES TWICE A DAY, Disp: , Rfl:  .  bimatoprost (LUMIGAN) 0.01 % SOLN, Place 1 drop into both eyes daily. , Disp: , Rfl:  .  brimonidine-timolol (COMBIGAN) 0.2-0.5 % ophthalmic solution, Place 1 drop into both eyes See admin instructions. 1 drop into both eyes in the morning, then 1 drop into both eyes 8 hours later,  Disp: , Rfl:  .  Calcium Carbonate-Vitamin D (OYSTER SHELL CALCIUM 500 + D PO), Take 1 tablet by mouth 2 (two) times a day., Disp: , Rfl:  .  Cholecalciferol (VITAMIN D-1000 MAX ST) 1000 UNITS tablet, Take 1,000 Units by mouth daily. Reported on 08/05/2015, Disp: , Rfl:  .  famotidine (PEPCID) 20 MG tablet, Take 1 tablet (20 mg total) by mouth daily., Disp: 90 tablet, Rfl: 1 .  ferrous sulfate 325 (65 FE) MG tablet, TAKE 1 TABLET BY MOUTH EVERY DAY WITH BREAKFAST, Disp: 90 tablet, Rfl: 1 .  fluticasone (FLONASE) 50 MCG/ACT nasal spray, SPRAY 2 SPRAYS INTO EACH NOSTRIL EVERY DAY (Patient taking differently: PRN), Disp: 16 g, Rfl: 1 .  glucose blood test strip, Use as instructed, Disp: 100 each, Rfl: 12 .  metFORMIN (GLUCOPHAGE-XR) 750 MG 24 hr tablet, TAKE 2 TABLETS (1,500 MG TOTAL) BY MOUTH DAILY WITH BREAKFAST., Disp: 180 tablet, Rfl: 1 .  olmesartan (BENICAR) 20 MG tablet, TAKE 1 TABLET BY MOUTH EVERY DAY, Disp: 90 tablet, Rfl: 0 .  omega-3 acid ethyl esters (LOVAZA) 1 g capsule, TAKE 1 CAPSULE (1 G TOTAL) BY MOUTH 2 (TWO) TIMES DAILY., Disp: 180 capsule, Rfl: 0 .  ONETOUCH DELICA LANCETS FINE MISC, 1 each by Does not apply route daily. 30 G Lancets- One Touch Delica, Disp: 751 each, Rfl: 1 .  [START ON 10/03/2018] oxyCODONE-acetaminophen (PERCOCET) 7.5-325 MG tablet, Take 1 tablet by mouth every 8 (eight) hours as needed for moderate pain or severe  pain., Disp: 90 tablet, Rfl: 0 .  [START ON 11/02/2018] oxyCODONE-acetaminophen (PERCOCET) 7.5-325 MG tablet, Take 1 tablet by mouth every 8 (eight) hours as needed for moderate pain or severe pain., Disp: 90 tablet, Rfl: 0 .  pravastatin (PRAVACHOL) 40 MG tablet, Take 1 tablet (40 mg total) by mouth daily., Disp: 90 tablet, Rfl: 1 .  PREMARIN vaginal cream, PLACE 1 APPLICATORFUL VAGINALLY DAILY., Disp: 30 g, Rfl: 5  Assessment and Plan: 1. Chronic pain syndrome   2. Chronic pain of right knee   3. Osteoarthritis of multiple joints, unspecified  osteoarthritis type   4. Pain in both hands   5. Chronic midline low back pain without sciatica   6. Long term prescription opiate use   Based on our discussion today and upon review of the [place] practitioner database information we will refill her medications dated for August 14 and September 13.  I will have her continue these and continue follow-up with her primary care physicians for baseline medical care.  We will schedule her for 76-month return via virtual visit if possible and ultimately we may consider a right side geniculate nerve block for her right knee pain as discussed today and we will await her consideration as to whether she wants to proceed with this.   Follow Up Instructions:    I discussed the assessment and treatment plan with the patient. The patient was provided an opportunity to ask questions and all were answered. The patient agreed with the plan and demonstrated an understanding of the instructions.   The patient was advised to call back or seek an in-person evaluation if the symptoms worsen or if the condition fails to improve as anticipated.  I provided 30 minutes of non-face-to-face time during this encounter.   [name], MD

## 2018-10-13 ENCOUNTER — Other Ambulatory Visit: Payer: Self-pay

## 2018-10-13 MED ORDER — ONETOUCH DELICA LANCETS 30G MISC: 1.0000 | Freq: Every day | 1 refills | Status: DC

## 2018-10-15 ENCOUNTER — Other Ambulatory Visit: Payer: Self-pay

## 2018-10-15 ENCOUNTER — Ambulatory Visit
Admission: RE | Admit: 2018-10-15 | Discharge: 2018-10-15 | Disposition: A | Discharge status: Home / Self Care | Payer: Medicare Other | Source: Ambulatory Visit | Attending: Family Medicine | Admitting: Family Medicine

## 2018-10-15 DIAGNOSIS — R928 Other abnormal and inconclusive findings on diagnostic imaging of breast: Secondary | ICD-10-CM | POA: Insufficient documentation

## 2018-10-15 DIAGNOSIS — N6313 Unspecified lump in the right breast, lower outer quadrant: Secondary | ICD-10-CM | POA: Diagnosis not present

## 2018-10-15 DIAGNOSIS — N6311 Unspecified lump in the right breast, upper outer quadrant: Secondary | ICD-10-CM | POA: Diagnosis not present

## 2018-10-15 DIAGNOSIS — N6310 Unspecified lump in the right breast, unspecified quadrant: Secondary | ICD-10-CM

## 2018-10-15 DIAGNOSIS — R922 Inconclusive mammogram: Secondary | ICD-10-CM | POA: Diagnosis not present

## 2018-10-15 MED ORDER — ONETOUCH DELICA LANCETS 30G MISC: 1.0000 | Freq: Every day | 1 refills | Status: DC

## 2018-10-22 DIAGNOSIS — Z961 Presence of intraocular lens: Secondary | ICD-10-CM | POA: Diagnosis not present

## 2018-10-22 DIAGNOSIS — H401113 Primary open-angle glaucoma, right eye, severe stage: Secondary | ICD-10-CM | POA: Diagnosis not present

## 2018-10-22 DIAGNOSIS — H401122 Primary open-angle glaucoma, left eye, moderate stage: Secondary | ICD-10-CM | POA: Diagnosis not present

## 2018-10-22 DIAGNOSIS — H35372 Puckering of macula, left eye: Secondary | ICD-10-CM | POA: Diagnosis not present

## 2018-10-23 DIAGNOSIS — I10 Essential (primary) hypertension: Secondary | ICD-10-CM | POA: Diagnosis not present

## 2018-10-23 DIAGNOSIS — N2581 Secondary hyperparathyroidism of renal origin: Secondary | ICD-10-CM | POA: Diagnosis not present

## 2018-10-23 DIAGNOSIS — N183 Chronic kidney disease, stage 3 (moderate): Secondary | ICD-10-CM | POA: Diagnosis not present

## 2018-11-10 ENCOUNTER — Other Ambulatory Visit: Payer: Self-pay

## 2018-11-10 NOTE — Patient Outreach (Signed)
[facility]) Care Management  11/10/2018  AKEMI BURLEW 05/13/1932 [id number]   Medication Adherence call to Mrs. [place] Compliant Voice message left with a call back number. Mrs. Gerner is showing past due on Pravastatin 40 mg under [place].   [place] Management Direct Dial [phone]  Fax [phone] [email or web address]

## 2018-11-18 ENCOUNTER — Encounter: Payer: Self-pay | Admitting: Anesthesiology

## 2018-11-18 ENCOUNTER — Ambulatory Visit: Payer: Medicare Other | Attending: Anesthesiology | Admitting: Anesthesiology

## 2018-11-18 ENCOUNTER — Other Ambulatory Visit: Payer: Self-pay

## 2018-11-18 DIAGNOSIS — M25561 Pain in right knee: Secondary | ICD-10-CM

## 2018-11-18 DIAGNOSIS — M79641 Pain in right hand: Secondary | ICD-10-CM

## 2018-11-18 DIAGNOSIS — Z79891 Long term (current) use of opiate analgesic: Secondary | ICD-10-CM

## 2018-11-18 DIAGNOSIS — M79642 Pain in left hand: Secondary | ICD-10-CM

## 2018-11-18 DIAGNOSIS — M159 Polyosteoarthritis, unspecified: Secondary | ICD-10-CM | POA: Diagnosis not present

## 2018-11-18 DIAGNOSIS — G8929 Other chronic pain: Secondary | ICD-10-CM

## 2018-11-18 DIAGNOSIS — M25562 Pain in left knee: Secondary | ICD-10-CM

## 2018-11-18 DIAGNOSIS — G894 Chronic pain syndrome: Secondary | ICD-10-CM | POA: Diagnosis not present

## 2018-11-18 DIAGNOSIS — M545 Low back pain: Secondary | ICD-10-CM

## 2018-11-18 MED ORDER — OXYCODONE-ACETAMINOPHEN 7.5-325 MG PO TABS: 1.0000 | ORAL_TABLET | Freq: Three times a day (TID) | ORAL | 0 refills | Status: AC | PRN

## 2018-11-18 MED ORDER — OXYCODONE-ACETAMINOPHEN 7.5-325 MG PO TABS: 1.0000 | ORAL_TABLET | Freq: Three times a day (TID) | ORAL | 0 refills | Status: DC | PRN

## 2018-11-18 NOTE — Progress Notes (Signed)
Virtual Visit via Telephone Note  I connected with Taylor [name] on 11/18/18 at 10:15 AM EDT by telephone and verified that I am speaking with the correct person using two identifiers.  Location: Patient: Home Provider: Pain control center   I discussed the limitations, risks, security and privacy concerns of performing an evaluation and management service by telephone and the availability of in person appointments. I also discussed with the patient that there may be a patient responsible charge related to this service. The patient expressed understanding and agreed to proceed.   History of Present Illness: I spoke with Taylor [name] today via Physiological scientist.  She still having low back pain at the same nature.  She gets some radiation into both legs similar to what she has had chronically.  No significant changes are reported in strength bowel or bladder function and the quality characteristic and distribution of her pain have been stable in nature.  She is still taking her Percocet 3 times a day and these are working well for her.  She continues to derive good functional lifestyle improvement with the medications for a chronic pain syndrome.  Otherwise she is in her usual state of health.  She denies side effects with the medications.    Observations/Objective: Current Outpatient Medications:  .  acetaminophen (TYLENOL) 500 MG tablet, Take 1 tablet (500 mg total) by mouth 3 (three) times daily. (Patient taking differently: Take 500 mg by mouth as needed. ), Disp: 90 tablet, Rfl: 0 .  amLODipine (NORVASC) 10 MG tablet, Take 1 tablet (10 mg total) by mouth daily., Disp: 90 tablet, Rfl: 1 .  AZOPT 1 % ophthalmic suspension, INSTILL 1 DROP INTO BOTH EYES TWICE A DAY, Disp: , Rfl:  .  bimatoprost (LUMIGAN) 0.01 % SOLN, Place 1 drop into both eyes daily. , Disp: , Rfl:  .  brimonidine-timolol (COMBIGAN) 0.2-0.5 % ophthalmic solution, Place 1 drop into both eyes See admin instructions. 1 drop  into both eyes in the morning, then 1 drop into both eyes 8 hours later, Disp: , Rfl:  .  Calcium Carbonate-Vitamin D (OYSTER SHELL CALCIUM 500 + D PO), Take 1 tablet by mouth 2 (two) times a day., Disp: , Rfl:  .  Cholecalciferol (VITAMIN D-1000 MAX ST) 1000 UNITS tablet, Take 1,000 Units by mouth daily. Reported on 08/05/2015, Disp: , Rfl:  .  famotidine (PEPCID) 20 MG tablet, Take 1 tablet (20 mg total) by mouth daily., Disp: 90 tablet, Rfl: 1 .  ferrous sulfate 325 (65 FE) MG tablet, TAKE 1 TABLET BY MOUTH EVERY DAY WITH BREAKFAST, Disp: 90 tablet, Rfl: 1 .  fluticasone (FLONASE) 50 MCG/ACT nasal spray, SPRAY 2 SPRAYS INTO EACH NOSTRIL EVERY DAY (Patient taking differently: PRN), Disp: 16 g, Rfl: 1 .  glucose blood test strip, Use as instructed, Disp: 100 each, Rfl: 12 .  metFORMIN (GLUCOPHAGE-XR) 750 MG 24 hr tablet, TAKE 2 TABLETS (1,500 MG TOTAL) BY MOUTH DAILY WITH BREAKFAST., Disp: 180 tablet, Rfl: 1 .  olmesartan (BENICAR) 20 MG tablet, TAKE 1 TABLET BY MOUTH EVERY DAY, Disp: 90 tablet, Rfl: 0 .  omega-3 acid ethyl esters (LOVAZA) 1 g capsule, TAKE 1 CAPSULE (1 G TOTAL) BY MOUTH 2 (TWO) TIMES DAILY., Disp: 180 capsule, Rfl: 0 .  OneTouch Delica Lancets [id number] MISC, 1 each by Does not apply route daily., Disp: 100 each, Rfl: 1 .  [START ON 12/03/2018] oxyCODONE-acetaminophen (PERCOCET) 7.5-325 MG tablet, Take 1 tablet by mouth every 8 (eight) hours as needed  for moderate pain or severe pain., Disp: 90 tablet, Rfl: 0 .  [START ON 01/02/2019] oxyCODONE-acetaminophen (PERCOCET) 7.5-325 MG tablet, Take 1 tablet by mouth every 8 (eight) hours as needed for moderate pain or severe pain., Disp: 90 tablet, Rfl: 0 .  pravastatin (PRAVACHOL) 40 MG tablet, Take 1 tablet (40 mg total) by mouth daily., Disp: 90 tablet, Rfl: 1 .  PREMARIN vaginal cream, PLACE 1 APPLICATORFUL VAGINALLY DAILY., Disp: 30 g, Rfl: 5   Assessment and Plan: 1. Chronic pain syndrome   2. Chronic pain of right knee   3.  Osteoarthritis of multiple joints, unspecified osteoarthritis type   4. Pain in both hands   5. Chronic midline low back pain without sciatica   6. Long term prescription opiate use   7. Chronic pain of both knees   Based on discussion today and upon review of the [facility] practitioner database information I am going to refill her medications for October 14 and November 13.  I want her to continue with her stretching strengthening exercises and she is instructed to contact the pain control center should she have any other pain related problems.  I have also encouraged her to continue follow-up with her primary care physicians for her baseline medical care.  She is scheduled for 101-month return to clinic.   Follow Up Instructions:    I discussed the assessment and treatment plan with the patient. The patient was provided an opportunity to ask questions and all were answered. The patient agreed with the plan and demonstrated an understanding of the instructions.   The patient was advised to call back or seek an in-person evaluation if the symptoms worsen or if the condition fails to improve as anticipated.  I provided 30 minutes of non-face-to-face time during this encounter.   [name], MD

## 2018-11-25 DIAGNOSIS — M810 Age-related osteoporosis without current pathological fracture: Secondary | ICD-10-CM | POA: Diagnosis not present

## 2018-11-28 DIAGNOSIS — M79674 Pain in right toe(s): Secondary | ICD-10-CM | POA: Diagnosis not present

## 2018-11-28 DIAGNOSIS — B351 Tinea unguium: Secondary | ICD-10-CM | POA: Diagnosis not present

## 2018-11-28 DIAGNOSIS — M79675 Pain in left toe(s): Secondary | ICD-10-CM | POA: Diagnosis not present

## 2018-12-02 ENCOUNTER — Other Ambulatory Visit: Payer: Self-pay | Admitting: Family Medicine

## 2018-12-02 DIAGNOSIS — D508 Other iron deficiency anemias: Secondary | ICD-10-CM

## 2018-12-15 ENCOUNTER — Encounter: Payer: Self-pay | Admitting: Family Medicine

## 2018-12-15 ENCOUNTER — Other Ambulatory Visit: Payer: Self-pay

## 2018-12-15 ENCOUNTER — Ambulatory Visit (INDEPENDENT_AMBULATORY_CARE_PROVIDER_SITE_OTHER): Payer: Medicare Other | Admitting: Family Medicine

## 2018-12-15 VITALS — BP 130/68 | HR 69 | Temp 97.1°F | Resp 16 | Ht 61.0 in | Wt 190.1 lb

## 2018-12-15 DIAGNOSIS — E1129 Type 2 diabetes mellitus with other diabetic kidney complication: Secondary | ICD-10-CM | POA: Diagnosis not present

## 2018-12-15 DIAGNOSIS — E1322 Other specified diabetes mellitus with diabetic chronic kidney disease: Secondary | ICD-10-CM

## 2018-12-15 DIAGNOSIS — I10 Essential (primary) hypertension: Secondary | ICD-10-CM | POA: Diagnosis not present

## 2018-12-15 DIAGNOSIS — Z23 Encounter for immunization: Secondary | ICD-10-CM

## 2018-12-15 DIAGNOSIS — E7800 Pure hypercholesterolemia, unspecified: Secondary | ICD-10-CM | POA: Diagnosis not present

## 2018-12-15 DIAGNOSIS — R790 Abnormal level of blood mineral: Secondary | ICD-10-CM | POA: Diagnosis not present

## 2018-12-15 DIAGNOSIS — M545 Low back pain: Secondary | ICD-10-CM

## 2018-12-15 DIAGNOSIS — N183 Chronic kidney disease, stage 3 unspecified: Secondary | ICD-10-CM

## 2018-12-15 DIAGNOSIS — D649 Anemia, unspecified: Secondary | ICD-10-CM

## 2018-12-15 DIAGNOSIS — I700 Atherosclerosis of aorta: Secondary | ICD-10-CM

## 2018-12-15 DIAGNOSIS — G8929 Other chronic pain: Secondary | ICD-10-CM

## 2018-12-15 DIAGNOSIS — E785 Hyperlipidemia, unspecified: Secondary | ICD-10-CM

## 2018-12-15 DIAGNOSIS — R809 Proteinuria, unspecified: Secondary | ICD-10-CM | POA: Diagnosis not present

## 2018-12-15 DIAGNOSIS — M810 Age-related osteoporosis without current pathological fracture: Secondary | ICD-10-CM

## 2018-12-15 DIAGNOSIS — I129 Hypertensive chronic kidney disease with stage 1 through stage 4 chronic kidney disease, or unspecified chronic kidney disease: Secondary | ICD-10-CM

## 2018-12-15 DIAGNOSIS — E1169 Type 2 diabetes mellitus with other specified complication: Secondary | ICD-10-CM

## 2018-12-15 LAB — POCT GLYCOSYLATED HEMOGLOBIN (HGB A1C): Hemoglobin A1C: 6.4 % — AB (ref 4.0–5.6)

## 2018-12-15 MED ORDER — METFORMIN HCL ER 750 MG PO TB24: 1500.0000 mg | ORAL_TABLET | Freq: Every day | ORAL | 1 refills | Status: DC

## 2018-12-15 MED ORDER — FERROUS SULFATE 325 (65 FE) MG PO TABS: 325.0000 mg | ORAL_TABLET | Freq: Every day | ORAL | 1 refills | Status: DC

## 2018-12-15 MED ORDER — PRAVASTATIN SODIUM 40 MG PO TABS: 40.0000 mg | ORAL_TABLET | Freq: Every day | ORAL | 1 refills | Status: DC

## 2018-12-15 MED ORDER — OLMESARTAN MEDOXOMIL 20 MG PO TABS: 20.0000 mg | ORAL_TABLET | Freq: Every day | ORAL | 1 refills | Status: DC

## 2018-12-15 NOTE — Addendum Note (Signed)
Addended by: [name] F on: 12/15/2018 02:01 PM   Modules accepted: Orders

## 2018-12-15 NOTE — Progress Notes (Signed)
Name: Taylor [name]   MRN: [id number]    DOB: 1932-10-18   Date:12/15/2018       Progress Note  Subjective  Chief Complaint  Chief Complaint  Patient presents with  . Diabetes  . Hypertension  . Hypothyroidism  . Gastroesophageal Reflux  . Hyperlipidemia    HPI  [id number] glucose is usually 130's-150's fasting, taking metformin 750 and id compliant.  She denies polyphagia, polydipsia or polyuria. Eye exam is due and already has an appointment scheduled, she   was recently seen by Podiatrist . She has a history of microalbuminuria and is taking ARB, she also has CKI and is under the care of Dr. Candiss Norse. She takes Lovaza and statin for dyslipidemia and denies side effects.  She denies side effects of medication. Hgb1C is at goal today at 6.4%  Hearing loss: very difficulty to hear [place], had to repeat same question multiple times, she forgot her hearing aids again today. I had to remove my mask so she could hear me  HTN with CKI stage III: also has secondary hyperparathyroidism, under the care of nephrologist, Dr. Candiss Norse. No pruritis noticed, good urine outputUnchanged   Chronic pain: she has chronic right knee pain, under the care of pain clinic and uses a walker to assist with ambulation . She also has daily back pain and feels weak on her back, discussed PT   GERD: well controlled with Pepcid, no heartburn or indigestion. Unchanged   Morbid obesity: BMI above 35 with co-morbidities, she is not very compliant with diabetic diet, but has a diabetic meal from meals on wheels.   Atherosclerosis aorta: on statin therapy not on aspirin because of  high risk of falls   Osteoporosis: seeing Dr. Honor Junes for Prolia and is tolerating it well. Unchanged   Patient Active Problem List   Diagnosis Date Noted  . Morbid obesity ([place]) 04/08/2018  . Age-related osteoporosis without current pathological fracture 09/26/2017  . Pain in both hands 09/23/2017  . Hyperparathyroidism ([place])  08/06/2017  . Chronic kidney disease, stage III (moderate) 08/06/2017  . HLD (hyperlipidemia) 07/25/2017  . Osteoporotic compression fracture of spine ([place]) 03/22/2017  . Assistance needed with transportation 03/21/2017  . Iron deficiency anemia 12/19/2016  . Aortic atherosclerosis ([place]) 11/23/2016  . Anterolisthesis 11/23/2016  . Chronic pain syndrome 09/13/2016  . Vitamin D deficiency 08/19/2016  . Anemia, unspecified 08/19/2016  . Primary open angle glaucoma (POAG) of both eyes, severe stage 08/17/2016  . Hypertensive retinopathy 08/17/2016  . Long term current use of opiate analgesic 07/19/2016  . Dyslipidemia associated with type 2 diabetes mellitus ([place]) 04/16/2016  . Chronic midline low back pain without sciatica 04/16/2016  . Chronic pain of right knee 04/16/2016  . Vaginal dryness 02/22/2015  . Calculus of kidney 11/10/2014  . Osteoarthritis, multiple sites 09/09/2014  . Type 2 diabetes mellitus with microalbuminuria ([place]) 09/09/2014  . Glaucoma 09/09/2014  . Benign hypertension with CKD (chronic kidney disease) stage III ([place]) 09/09/2014  . Chronic pain 09/09/2014  . Tinea corporis 09/09/2014  . Umbilical hernia [id number]  . Lump or mass in breast     Past Surgical History:  Procedure Laterality Date  . ABDOMINAL HYSTERECTOMY  1958   menorrhagia  . BACK SURGERY    . BREAST BIOPSY Left 01/25/2012  . CATARACT EXTRACTION, BILATERAL Bilateral    1st 06/07/15 and the 2nd 06/21/15  . CHOLECYSTECTOMY    . COLONOSCOPY  2003   [facility]  . CORONARY ARTERY BYPASS GRAFT Left 01/03/2012,01/24/2012  Left FNA, Left breast Encore bx  . KNEE SURGERY  2009,2011,2012   twice on right and once on left  . KNEE SURGERY    . LIPOMA EXCISION  1998  . [place]  2004  . TONSILLECTOMY     age of 37    Family History  Problem Relation Age of Onset  . Cancer Mother        skin cancer  . Stroke Mother   . Heart disease Father   . Breast cancer Neg Hx     Social History    Socioeconomic History  . Marital status: Divorced    Spouse name: Not on file  . Number of children: 4  . Years of education: Not on file  . Highest education level: Some college, no degree  Occupational History  . 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 Financial resource strain: Not very hard  . Food insecurity    Worry: Never true    Inability: Never true  . Transportation needs    Medical: No    Non-medical: No  Tobacco Use  . Smoking status: Never Smoker  . Smokeless tobacco: Never Used  Substance and Sexual Activity  . Alcohol use: No    Alcohol/week: 0.0 standard drinks  . Drug use: No  . Sexual activity: Not Currently  Lifestyle  . Physical activity    Days per week: 0 days    Minutes per session: 0 min  . Stress: Not at all  Relationships  . Social connections    Talks on phone: More than three times a week    Gets together: Twice a week    Attends religious service: More than 4 times per year    Active member of club or organization: Yes    Attends meetings of clubs or organizations: More than 4 times per year    Relationship status: Divorced  . Intimate partner violence    Fear of current or ex partner: No    Emotionally abused: No    Physically abused: No    Forced sexual activity: No  Other Topics Concern  . Not on file  Social History Narrative   Lives alone   Children do not live in town   Friend helps out: Cira Rue - [phone]   Independent prior to her fall 10/2016     Current Outpatient Medications:  .  acetaminophen (TYLENOL) 500 MG tablet, Take 1 tablet (500 mg total) by mouth 3 (three) times daily. (Patient taking differently: Take 500 mg by mouth as needed. ), Disp: 90 tablet, Rfl: 0 .  amLODipine (NORVASC) 10 MG tablet, Take 1 tablet (10 mg total) by mouth daily., Disp: 90 tablet, Rfl: 1 .  AZOPT 1 % ophthalmic suspension, INSTILL 1 DROP INTO BOTH EYES TWICE A DAY, Disp: , Rfl:   .  bimatoprost (LUMIGAN) 0.01 % SOLN, Place 1 drop into both eyes daily. , Disp: , Rfl:  .  brimonidine-timolol (COMBIGAN) 0.2-0.5 % ophthalmic solution, Place 1 drop into both eyes See admin instructions. 1 drop into both eyes in the morning, then 1 drop into both eyes 8 hours later, Disp: , Rfl:  .  Calcium Carbonate-Vitamin D (OYSTER SHELL CALCIUM 500 + D PO), Take 1 tablet by mouth 2 (two) times a day., Disp: , Rfl:  .  Cholecalciferol (VITAMIN D-1000 MAX ST) 1000 UNITS tablet, Take 1,000 Units by mouth daily. Reported on 08/05/2015,  Disp: , Rfl:  .  famotidine (PEPCID) 20 MG tablet, Take 1 tablet (20 mg total) by mouth daily., Disp: 90 tablet, Rfl: 1 .  ferrous sulfate 325 (65 FE) MG tablet, Take 1 tablet (325 mg total) by mouth daily with breakfast., Disp: 90 tablet, Rfl: 1 .  fluticasone (FLONASE) 50 MCG/ACT nasal spray, SPRAY 2 SPRAYS INTO EACH NOSTRIL EVERY DAY (Patient taking differently: PRN), Disp: 16 g, Rfl: 1 .  glucose blood test strip, Use as instructed, Disp: 100 each, Rfl: 12 .  metFORMIN (GLUCOPHAGE-XR) 750 MG 24 hr tablet, Take 2 tablets (1,500 mg total) by mouth daily with breakfast., Disp: 180 tablet, Rfl: 1 .  olmesartan (BENICAR) 20 MG tablet, Take 1 tablet (20 mg total) by mouth daily., Disp: 90 tablet, Rfl: 1 .  omega-3 acid ethyl esters (LOVAZA) 1 g capsule, TAKE 1 CAPSULE (1 G TOTAL) BY MOUTH 2 (TWO) TIMES DAILY., Disp: 180 capsule, Rfl: 0 .  OneTouch Delica Lancets [id number] MISC, 1 each by Does not apply route daily., Disp: 100 each, Rfl: 1 .  oxyCODONE-acetaminophen (PERCOCET) 7.5-325 MG tablet, Take 1 tablet by mouth every 8 (eight) hours as needed for moderate pain or severe pain., Disp: 90 tablet, Rfl: 0 .  [START ON 01/02/2019] oxyCODONE-acetaminophen (PERCOCET) 7.5-325 MG tablet, Take 1 tablet by mouth every 8 (eight) hours as needed for moderate pain or severe pain., Disp: 90 tablet, Rfl: 0 .  pravastatin (PRAVACHOL) 40 MG tablet, Take 1 tablet (40 mg total) by mouth  daily., Disp: 90 tablet, Rfl: 1 .  PREMARIN vaginal cream, PLACE 1 APPLICATORFUL VAGINALLY DAILY., Disp: 30 g, Rfl: 5  Allergies  Allergen Reactions  . Ace Inhibitors     cough    I personally reviewed active problem list, medication list, allergies, family history, social history with the patient/caregiver today.   ROS  Constitutional: Negative for fever or weight change.  Respiratory: Negative for cough and shortness of breath.   Cardiovascular: Negative for chest pain or palpitations.  Gastrointestinal: Negative for abdominal pain, no bowel changes.  Musculoskeletal: Positive for gait problem and right knee  joint swelling.  Skin: Negative for rash.  Neurological: Negative for dizziness or headache.  No other specific complaints in a complete review of systems (except as listed in HPI above)  Objective  Vitals:   12/15/18 1337  BP: 130/68  Pulse: 69  Resp: 16  Temp: (!) 97.1 F (36.2 C)  TempSrc: Temporal  SpO2: 99%  Weight: 190 lb 1.6 oz (86.2 kg)  Height: 5\' 1"  (1.549 m)    Body mass index is 35.92 kg/m.  Physical Exam  Constitutional: Patient appears well-developed and well-nourished. Obese  No distress.  HEENT: head atraumatic, normocephalic, pupils equal and reactive to light Cardiovascular: Normal rate, regular rhythm and normal heart sounds.  No murmur heard. No BLE edema. Pulmonary/Chest: Effort normal and breath sounds normal. No respiratory distress. Abdominal: Soft.  There is no tenderness. Psychiatric: Patient has a normal mood and affect. behavior is normal. Judgment and thought content normal. Muscular Skeletal: crepitus and effusion of right knee, pain during palpation of lumbar spine, negative straight leg raise   Recent Results (from the past 2160 hour(s))  POCT HgB A1C     Status: Abnormal   Collection Time: 12/15/18  1:41 PM  Result Value Ref Range   Hemoglobin A1C 6.4 (A) 4.0 - 5.6 %   HbA1c POC (<> result, manual entry)     HbA1c, POC  (prediabetic range)  HbA1c, POC (controlled diabetic range)       PHQ2/9: Depression screen [facility] 2/9 12/15/2018 08/13/2018 04/08/2018 12/06/2017 11/19/2017  Decreased Interest 0 0 0 0 0  Down, Depressed, Hopeless 0 0 0 0 -  PHQ - 2 Score 0 0 0 0 0  Altered sleeping 0 0 - 0 -  Tired, decreased energy 0 1 - 0 -  Change in appetite 0 1 - 0 -  Feeling bad or failure about yourself  0 0 - 0 -  Trouble concentrating 0 0 - 0 -  Moving slowly or fidgety/restless 0 0 - 0 -  Suicidal thoughts 0 0 - 0 -  PHQ-9 Score 0 2 - 0 -  Difficult doing work/chores - Not difficult at all - Not difficult at all -  Some recent data might be hidden    phq 9 is negative   Fall Risk: Fall Risk  12/15/2018 08/13/2018 04/08/2018 03/31/2018 01/27/2018  Falls in the past year? 0 0 0 0 0  Comment - - - - -  Number falls in past yr: 0 0 0 - -  Injury with Fall? 0 0 0 - -  Risk Factor Category  - - - - -  Risk for fall due to : - - Impaired balance/gait - -  Follow up - - Falls prevention discussed - -     Functional Status Survey: Is the patient deaf or have difficulty hearing?: Yes Does the patient have difficulty seeing, even when wearing glasses/contacts?: Yes Does the patient have difficulty concentrating, remembering, or making decisions?: No Does the patient have difficulty walking or climbing stairs?: Yes Does the patient have difficulty dressing or bathing?: No Does the patient have difficulty doing errands alone such as visiting a doctor's office or shopping?: Yes    Assessment & Plan  1. Type 2 diabetes mellitus with microalbuminuria, without long-term current use of insulin (HCC)  - POCT HgB A1C - metFORMIN (GLUCOPHAGE-XR) 750 MG 24 hr tablet; Take 2 tablets (1,500 mg total) by mouth daily with breakfast.  Dispense: 180 tablet; Refill: 1  2. Need for immunization against influenza  - Flu Vaccine QUAD High Dose(Fluad)  3. Pure hypercholesterolemia  - pravastatin (PRAVACHOL) 40 MG  tablet; Take 1 tablet (40 mg total) by mouth daily.  Dispense: 90 tablet; Refill: 1  4. Essential hypertension  - olmesartan (BENICAR) 20 MG tablet; Take 1 tablet (20 mg total) by mouth daily.  Dispense: 90 tablet; Refill: 1  5. Anemia, unspecified type  - ferrous sulfate 325 (65 FE) MG tablet; Take 1 tablet (325 mg total) by mouth daily with breakfast.  Dispense: 90 tablet; Refill: 1  6. Low ferritin level  - ferrous sulfate 325 (65 FE) MG tablet; Take 1 tablet (325 mg total) by mouth daily with breakfast.  Dispense: 90 tablet; Refill: 1  7. Dyslipidemia associated with type 2 diabetes mellitus ([place])   8. Secondary diabetes mellitus with stage 3 chronic kidney disease and hypertension (HCC)  - pravastatin (PRAVACHOL) 40 MG tablet; Take 1 tablet (40 mg total) by mouth daily.  Dispense: 90 tablet; Refill: 1 - olmesartan (BENICAR) 20 MG tablet; Take 1 tablet (20 mg total) by mouth daily.  Dispense: 90 tablet; Refill: 1 - metFORMIN (GLUCOPHAGE-XR) 750 MG 24 hr tablet; Take 2 tablets (1,500 mg total) by mouth daily with breakfast.  Dispense: 180 tablet; Refill: 1  9. Aortic atherosclerosis ([place])  On statin therapy   10. Age-related osteoporosis without current pathological fracture  Keep  follow up with Dr. Honor Junes   11. Chronic bilateral low back pain without sciatica  - Ambulatory referral to Physical Therapy

## 2018-12-16 ENCOUNTER — Other Ambulatory Visit: Payer: Self-pay | Admitting: Family Medicine

## 2018-12-16 DIAGNOSIS — E782 Mixed hyperlipidemia: Secondary | ICD-10-CM

## 2018-12-16 LAB — COMPLETE METABOLIC PANEL WITH GFR
AG Ratio: 1.8 (calc) (ref 1.0–2.5)
ALT: 5 U/L — ABNORMAL LOW (ref 6–29)
AST: 7 U/L — ABNORMAL LOW (ref 10–35)
Albumin: 4.2 g/dL (ref 3.6–5.1)
Alkaline phosphatase (APISO): 46 U/L (ref 37–153)
BUN/Creatinine Ratio: 13 (calc) (ref 6–22)
BUN: 21 mg/dL (ref 7–25)
CO2: 19 mmol/L — ABNORMAL LOW (ref 20–32)
Calcium: 9.4 mg/dL (ref 8.6–10.4)
Chloride: 109 mmol/L (ref 98–110)
Creat: 1.58 mg/dL — ABNORMAL HIGH (ref 0.60–0.88)
GFR, Est African American: 35 mL/min/{1.73_m2} — ABNORMAL LOW (ref >=60)
GFR, Est Non African American: 30 mL/min/{1.73_m2} — ABNORMAL LOW (ref >=60)
Globulin: 2.3 g/dL (calc) (ref 1.9–3.7)
Glucose, Bld: 126 mg/dL — ABNORMAL HIGH (ref 65–99)
Potassium: 4.4 mmol/L (ref 3.5–5.3)
Sodium: 139 mmol/L (ref 135–146)
Total Bilirubin: 0.3 mg/dL (ref 0.2–1.2)
Total Protein: 6.5 g/dL (ref 6.1–8.1)

## 2018-12-16 LAB — CBC WITH DIFFERENTIAL/PLATELET
Absolute Monocytes: 640 cells/uL (ref 200–950)
Basophils Absolute: 33 cells/uL (ref 0–200)
Basophils Relative: 0.5 %
Eosinophils Absolute: 79 cells/uL (ref 15–500)
Eosinophils Relative: 1.2 %
HCT: 35.1 % (ref 35.0–45.0)
Hemoglobin: 11.4 g/dL — ABNORMAL LOW (ref 11.7–15.5)
Lymphs Abs: 1690 cells/uL (ref 850–3900)
MCH: 29.1 pg (ref 27.0–33.0)
MCHC: 32.5 g/dL (ref 32.0–36.0)
MCV: 89.5 fL (ref 80.0–100.0)
MPV: 9.6 fL (ref 7.5–12.5)
Monocytes Relative: 9.7 %
Neutro Abs: 4158 cells/uL (ref 1500–7800)
Neutrophils Relative %: 63 %
Platelets: 371 10*3/uL (ref 140–400)
RBC: 3.92 10*6/uL (ref 3.80–5.10)
RDW: 13.3 % (ref 11.0–15.0)
Total Lymphocyte: 25.6 %
WBC: 6.6 10*3/uL (ref 3.8–10.8)

## 2018-12-16 LAB — LIPID PANEL
Cholesterol: 115 mg/dL (ref <200)
HDL: 47 mg/dL — ABNORMAL LOW (ref >=50)
LDL Cholesterol (Calc): 46 mg/dL (calc)
Non-HDL Cholesterol (Calc): 68 mg/dL (calc) (ref <130)
Total CHOL/HDL Ratio: 2.4 (calc) (ref <5.0)
Triglycerides: 137 mg/dL (ref <150)

## 2018-12-16 LAB — IRON,TIBC AND FERRITIN PANEL
%SAT: 29 % (calc) (ref 16–45)
Ferritin: 45 ng/mL (ref 16–288)
Iron: 81 ug/dL (ref 45–160)
TIBC: 275 mcg/dL (calc) (ref 250–450)

## 2018-12-24 ENCOUNTER — Ambulatory Visit: Payer: Medicare Other | Admitting: Physical Therapy

## 2018-12-30 ENCOUNTER — Other Ambulatory Visit: Payer: Self-pay | Admitting: Family Medicine

## 2018-12-30 ENCOUNTER — Ambulatory Visit: Payer: Medicare Other | Attending: Family Medicine | Admitting: Physical Therapy

## 2018-12-30 ENCOUNTER — Encounter: Payer: Self-pay | Admitting: Physical Therapy

## 2018-12-30 ENCOUNTER — Encounter: Payer: Medicare Other | Admitting: Physical Therapy

## 2018-12-30 ENCOUNTER — Other Ambulatory Visit: Payer: Self-pay

## 2018-12-30 DIAGNOSIS — R296 Repeated falls: Secondary | ICD-10-CM | POA: Diagnosis not present

## 2018-12-30 DIAGNOSIS — G8929 Other chronic pain: Secondary | ICD-10-CM

## 2018-12-30 DIAGNOSIS — R262 Difficulty in walking, not elsewhere classified: Secondary | ICD-10-CM | POA: Diagnosis not present

## 2018-12-30 DIAGNOSIS — M545 Low back pain, unspecified: Secondary | ICD-10-CM

## 2018-12-30 DIAGNOSIS — E782 Mixed hyperlipidemia: Secondary | ICD-10-CM

## 2018-12-30 NOTE — Therapy (Signed)
[place] [facility] [address], [place], [address] Phone: [phone]   Fax:  [phone]  Physical Therapy Evaluation  Patient Details  Name: Taylor [name] MRN: [id number] Date of Birth: [birth year] No data recorded  Encounter Date: 12/30/2018  PT End of Session - 12/31/18 0730    Visit Number  1    Number of Visits  17    Date for PT Re-Evaluation  02/24/19    PT Start Time  0145    PT Stop Time  0253    PT Time Calculation (min)  68 min    Activity Tolerance  Patient tolerated treatment well    Behavior During Therapy  [name] for tasks assessed/performed       Past Medical History:  Diagnosis Date  . Abnormal mammogram, unspecified 2013  . Breast screening, unspecified 2013  . Diabetes mellitus without complication ([place]) [id number]   non insulin dependent  . GERD (gastroesophageal reflux disease)   . Glaucoma 2003  . Gout   . Hyperlipidemia 2008  . Hypertension 1980's  . Lump or mass in breast 01/03/2012   left breast  . Obesity, unspecified 2013  . Osteoporosis   . Shingles 2013  . Special screening for malignant neoplasms, colon 2013    Past Surgical History:  Procedure Laterality Date  . ABDOMINAL HYSTERECTOMY  1958   menorrhagia  . BACK SURGERY    . BREAST BIOPSY Left 01/25/2012  . CATARACT EXTRACTION, BILATERAL Bilateral    1st 06/07/15 and the 2nd 06/21/15  . CHOLECYSTECTOMY    . COLONOSCOPY  2003   [facility]  . CORONARY ARTERY BYPASS GRAFT Left 01/03/2012,01/24/2012   Left FNA, Left breast [facility] bx  . KNEE SURGERY  2009,2011,2012   twice on right and once on left  . KNEE SURGERY    . LIPOMA EXCISION  1998  . [place]  2004  . TONSILLECTOMY     age of 85    There were no vitals filed for this visit.   Subjective Assessment - 12/30/18 1353    Pertinent History  Patinet is a 85 year old female presenting to clinic for LBP. Patient has LBP that bothers her when she stands up for  10-64mins for ADLS. Back pain does not radiate and feels achy. She can only sit without back support for 45mins without pain. Worst pain in the past week 9/10 best: 5/10. Reports she has had about 3 falls in the past year, and is usually falling backwards. Reports she is very unsteady with stepping onto varied surfaces. Ambulates with a hurry cane in her LUE and uses her RUE to furiture walk, and has a RW that she will use when she has someone to put it in her car with her when she goes out. When she gets to a store, she uses cart to help her walk. She lives alone and completes her own cooking, cleaning, but has trouble standing for the time it takes to cook/do dishing. Patient reports she gives herself bird baths in her bathroom because she cannot step over her tub to get into her tub/shower that has a seat in it. She drives and completes her own errands. When her church was open she enjoyed attending, enjoys playing games on her Ipad, and has friends in the community that come and check on her daily, as well as being apart of the "Are You Okay" program with the [place] police (they call daily and if  you do not answer they will send the police there to check on you). No steps to enter 1 story home, has a ramp. Denies sensation deficits.  Pt denies N/V, B&B changes, unexplained weight fluctuation, saddle paresthesia, fever, night sweats, or unrelenting night pain at this time.    Limitations  Sitting;Lifting;Standing;Walking    How long can you sit comfortably?  without support 10-82mins    How long can you stand comfortably?  10-59mins    How long can you walk comfortably?  with cane 39mins    Patient Stated Goals  Walk better    Currently in Pain?  Yes    Pain Score  5     Pain Location  Back    Pain Orientation  Lower    Pain Descriptors / Indicators  Aching;Dull    Pain Type  Chronic pain    Pain Radiating Towards  none    Pain Onset  More than a month ago    Pain Frequency  Intermittent     Aggravating Factors   walking, sitting without support, standing to complete ADLs    Pain Relieving Factors  rest    Effect of Pain on Daily Activities  Decreased independence, unable to complete cooking/cleaning some days, unable to step into her shower to bath on her shower seat          OBJECTIVE  Mental Status Patient is oriented to person, place and time.  Recent memory is intact.  Remote memory is intact.  Attention span and concentration are intact.  Expressive speech is intact.  Patient's fund of knowledge is within normal limits for educational level.  SENSATION: Grossly intact to light touch bilateral LEs as determined by testing dermatomes L2-S2 Proprioception and hot/cold testing deferred on this date   MUSCULOSKELETAL: Tremor: None Bulk: Normal Tone: Normal   Posture Forward head, rounded shoulders, increased thoracic kyphosis, lack of L TKE  Gait  HHA with SPC, needing min-modA to  Prevent LOB on 4 occasions. Decreased LLE step length and push off d/t lack of TKE/hip ext  Palpation No pain to palpation   Strength (out of 5) R/L 3+/4 Hip flexion 4-/4 Hip ER 5/5 Hip IR 5/4 Hip abduction 5/5 Hip adduction 5/5 Hip extension 3+/4+ Knee extension 4+/5 Knee flexion 4+/4+ Ankle dorsiflexion *Indicates pain     AROM (degrees) R/L (all movements include overpressure unless otherwise stated) Lumbar forward flexion (0-65):  Lumbar extension (0-30): 15d approx Lumbar rotation: R: 50% limited in standing d/t fear of falling/inability to wt shift, 25% limited in sitting L: WNL Lumbar lateral flexion (0-25) WNL bilat Hip IR (0-45): R: 13d L: 15d Hip ER (0-45): R: 40d L: 45d Hip Flexion (0-125): R: L: 90d Hip Abduction (0-40): R: 43d L: 45 Hip Ext (0-15) R: L: 15d R unable in prone, passively 10d Knee extension (0-15): R: 25d L: 4d Knee Flex R: 70d L: 100d *Indicates pain  PROM (degrees) PROM = AROM  Repeated Movements No centralization or  peripheralization of symptoms with repeated lumbar extension or flexion.    Passive Accessory Intervertebral Motion (PAIVM) Pt denies reproduction of back pain with CPA L1-L5 and UPA bilaterally L1-L5. Generally hypomobile throughout  Passive Physiological Intervertebral Motion (PPIVM) Normal flexion and extension with PPIVM testing   SPECIAL TESTS Slump: Negative bilat SLR: Negative bilat, difficult on RLE FABER: Negative bilat some stretch bilat FADIR: Positive bilat for hip tension  Hip scour: Negative bilat 5xSTS 33sec with definite need for UE use BERG: 27/56  10MWT 0.65m/s with HHA with SPC, needing min-modA to  Prevent LOB on 4 occasions Standing feet apart 71mins; feet together 94min; standing feet apart eyes closed 10sec Standing on foam: unable eyes open/closed Supine > sidelying > stand modI with need for extra time and UE support Sit to stand modI at beginning of session with BUE support and legs against back of mat table, minA at end of session d/t fatigue  Ther-Ex STS with UE support x5, with education on completing at home with sturdy, immovable chair to ensure safety Seated hip abd x10 YTB good carry over of proper technique following demo Seated alt marching x10 with cuing for upright posture and core control with good carry over Education on components of balance including LE strength, vision, sensation, and reactionary and dynamic balance, to decrease fall risk. Education patient on reducing fall risk at home with good response                            Objective measurements completed on examination: See above findings.              PT Education - 12/30/18 1410    Education Details  Patient was educated on diagnosis, anatomy and pathology involved, prognosis, role of PT, and was given an HEP, demonstrating exercise with proper form following verbal and tactile cues, and was given a paper hand out to continue exercise at home. Pt  was educated on and agreed to plan of care.    Person(s) Educated  Patient    Methods  Explanation;Demonstration;Tactile cues;Verbal cues;Handout    Comprehension  Verbalized understanding;Returned demonstration;Verbal cues required;Tactile cues required       PT Short Term Goals - 12/31/18 0840      PT SHORT TERM GOAL #1   Title  Pt will be independent with HEP in order to improve strength and decrease back pain in order to improve pain-free function at home and increase QOL    Baseline  12/30/18 HEP given    Time  4    Period  Weeks    Status  New      PT SHORT TERM GOAL #2   Title  Pt will improve BERG by at least 3 points in order to demonstrate clinically significant improvement in balance.    Baseline  12/30/18 27/56    Time  6    Period  Weeks    Status  New      PT SHORT TERM GOAL #3   Title  Pt will increase 10MWT by at least 0.13 m/s in order to demonstrate clinically significant improvement in community ambulation.    Baseline  12/30/18 0.18 m/s    Time  6      PT SHORT TERM GOAL #4   Title  Pt will decrease 5TSTS by at least 3 seconds in order to demonstrate clinically significant improvement in LE strength    Baseline  12/30/18 33sec    Time  6    Period  Weeks    Status  New        PT Long Term Goals - 12/31/18 0853      PT LONG TERM GOAL #1   Title  Patient will obtain a score of 46/56 on the BERG to demonstrate decreased fall risk    Baseline  12/30/18 27/56    Time  12    Period  Weeks    Status  New  PT LONG TERM GOAL #2   Title  Patient will demonstrate walk speed of 0.8-1.61m/s on 10MWT in order to demonstrate household ambulator walk speed with safety    Baseline  12/30/18 0.79m/s with HHA with SPC, needing min-modA to  Prevent LOB on 4 occasions    Time  12    Period  Weeks    Status  New      PT LONG TERM GOAL #3   Title  Patient will complete 5xSTS without UE support in 14.8sec in order to demonstrate age matched strength norms     Baseline  12/30/18 33sec with definite need for UE use    Time  12    Period  Weeks    Status  New      PT LONG TERM GOAL #4   Title  Patient will be able to ambulate 66ft with SPC without LOB/need for external support in order to demonstrate safety walking household distances/into story    Baseline  12/30/18 24ft with HHA with SPC, needing min-modA to  Prevent LOB on 4 occasions    Time  12    Period  Weeks    Status  New             Plan - 12/31/18 0804    Clinical Impression Statement  Pt is a 85 year old female presenting with LBP, and significant balance deficit/fall risk. Impairments in static and dynamic balance, LE weakness, decreased hip/knee ROM LLE>RLE, and pain. Activity limitations in sitting without support, standing without support, walking, transferring IND, lifting, carrying; inhibiting participation in ADLS (standing to cook/clean, being able to step over her tub to bathe IND, laundry, etc) and is at risk for falling. Would benefit from skilled PT to address above deficits and promote optimal return to PLOF.    Personal Factors and Comorbidities  Age;Fitness;Comorbidity 1;Comorbidity 2;Comorbidity 3+;Past/Current Experience;Sex    Comorbidities  HTN, HLD, GERD, osteoporosis    Examination-Activity Limitations  Bathing;Sit;Dressing;Transfers;Bed Mobility;Bend;Lift;Carry;Reach Overhead;Stand;Stairs    Examination-Participation Restrictions  Church;Laundry;Cleaning;Community Activity;Meal Prep    Stability/Clinical Decision Making  Evolving/Moderate complexity    Clinical Decision Making  Moderate    Rehab Potential  Good    PT Frequency  2x / week    PT Duration  8 weeks    PT Treatment/Interventions  Moist Heat;Traction;Gait training;Stair training;Balance training;Dry needling;Joint Manipulations;Spinal Manipulations;Passive range of motion;Manual techniques;Patient/family education;Therapeutic exercise;Electrical Stimulation;ADLs/Self Care Home Management;Therapeutic  activities;Functional mobility training;Energy conservation;Neuromuscular re-education    PT Next Visit Plan  Balance training, LE/core strengthening    PT Home Exercise Plan  STS from chair, seated hip abd, seated marching    Consulted and Agree with Plan of Care  Patient       Patient will benefit from skilled therapeutic intervention in order to improve the following deficits and impairments:  Abnormal gait, Decreased balance, Decreased endurance, Decreased mobility, Difficulty walking, Hypomobility, Cardiopulmonary status limiting activity, Decreased range of motion, Improper body mechanics, Decreased activity tolerance, Decreased coordination, Decreased safety awareness, Decreased strength, Increased fascial restricitons, Impaired flexibility, Postural dysfunction, Pain  Visit Diagnosis: Chronic midline low back pain without sciatica  Difficulty in walking, not elsewhere classified  Repeated falls     Problem List Patient Active Problem List   Diagnosis Date Noted  . Morbid obesity ([place]) 04/08/2018  . Age-related osteoporosis without current pathological fracture 09/26/2017  . Pain in both hands 09/23/2017  . Hyperparathyroidism ([place]) 08/06/2017  . Chronic kidney disease, stage III (moderate) 08/06/2017  . HLD (hyperlipidemia) 07/25/2017  .  Osteoporotic compression fracture of spine ([place]) 03/22/2017  . Assistance needed with transportation 03/21/2017  . Iron deficiency anemia 12/19/2016  . Aortic atherosclerosis ([place]) 11/23/2016  . Anterolisthesis 11/23/2016  . Chronic pain syndrome 09/13/2016  . Vitamin D deficiency 08/19/2016  . Anemia, unspecified 08/19/2016  . Primary open angle glaucoma (POAG) of both eyes, severe stage 08/17/2016  . Hypertensive retinopathy 08/17/2016  . Long term current use of opiate analgesic 07/19/2016  . Dyslipidemia associated with type 2 diabetes mellitus ([place]) 04/16/2016  . Chronic midline low back pain without sciatica 04/16/2016  .  Chronic pain of right knee 04/16/2016  . Vaginal dryness 02/22/2015  . Calculus of kidney 11/10/2014  . Osteoarthritis, multiple sites 09/09/2014  . Type 2 diabetes mellitus with microalbuminuria ([place]) 09/09/2014  . Glaucoma 09/09/2014  . Benign hypertension with CKD (chronic kidney disease) stage III ([place]) 09/09/2014  . Chronic pain 09/09/2014  . Tinea corporis 09/09/2014  . Umbilical hernia [id number]  . Lump or mass in breast    Shelton Silvas PT, DPT Shelton Silvas 12/31/2018, 11:00 AM  [facility] [address], [place], [address] Phone: [phone]   Fax:  [phone]  Name: Taylor [name] MRN: [id number] Date of Birth: Apr 08, 1932

## 2019-01-01 ENCOUNTER — Encounter: Payer: Medicare Other | Admitting: Physical Therapy

## 2019-01-05 ENCOUNTER — Ambulatory Visit: Payer: Medicare Other | Admitting: Physical Therapy

## 2019-01-05 ENCOUNTER — Other Ambulatory Visit: Payer: Self-pay

## 2019-01-05 ENCOUNTER — Encounter: Payer: Medicare Other | Admitting: Physical Therapy

## 2019-01-05 ENCOUNTER — Encounter: Payer: Self-pay | Admitting: Physical Therapy

## 2019-01-05 DIAGNOSIS — R262 Difficulty in walking, not elsewhere classified: Secondary | ICD-10-CM

## 2019-01-05 DIAGNOSIS — G8929 Other chronic pain: Secondary | ICD-10-CM

## 2019-01-05 DIAGNOSIS — M545 Low back pain, unspecified: Secondary | ICD-10-CM

## 2019-01-05 DIAGNOSIS — R296 Repeated falls: Secondary | ICD-10-CM

## 2019-01-05 NOTE — Therapy (Signed)
[facility] [address] [place], [address] Phone: [phone]   Fax:  [phone]  Physical Therapy Treatment  Patient Details  Name: Taylor [name] MRN: [id number] Date of Birth: May 15, 1932 No data recorded  Encounter Date: 01/05/2019  PT End of Session - 01/05/19 1447    Visit Number  2    Number of Visits  17    Date for PT Re-Evaluation  02/24/19    PT Start Time  0232    PT Stop Time  0315    PT Time Calculation (min)  43 min    Activity Tolerance  Patient tolerated treatment well    Behavior During Therapy  [facility] for tasks assessed/performed       Past Medical History:  Diagnosis Date  . Abnormal mammogram, unspecified 2013  . Breast screening, unspecified 2013  . Diabetes mellitus without complication ([place]) [id number]   non insulin dependent  . GERD (gastroesophageal reflux disease)   . Glaucoma 2003  . Gout   . Hyperlipidemia 2008  . Hypertension 1980's  . Lump or mass in breast 01/03/2012   left breast  . Obesity, unspecified 2013  . Osteoporosis   . Shingles 2013  . Special screening for malignant neoplasms, colon 2013    Past Surgical History:  Procedure Laterality Date  . ABDOMINAL HYSTERECTOMY  1958   menorrhagia  . BACK SURGERY    . BREAST BIOPSY Left 01/25/2012  . CATARACT EXTRACTION, BILATERAL Bilateral    1st 06/07/15 and the 2nd 06/21/15  . CHOLECYSTECTOMY    . COLONOSCOPY  2003   [facility]  . CORONARY ARTERY BYPASS GRAFT Left 01/03/2012,01/24/2012   Left FNA, Left breast [facility] bx  . KNEE SURGERY  2009,2011,2012   twice on right and once on left  . KNEE SURGERY    . LIPOMA EXCISION  1998  . [place]  2004  . TONSILLECTOMY     age of 38    There were no vitals filed for this visit.  Subjective Assessment - 01/05/19 1437    Subjective  Reports her pain is at her baseline, she calls it 0/10 because she says her back pain is always present. Has not had any falls or trips  since last visit.    Pertinent History  Patinet is a 85 year old female presenting to clinic for LBP. Patient has LBP that bothers her when she stands up for 10-28mins for ADLS. Back pain does not radiate and feels achy. She can only sit without back support for 47mins without pain. Worst pain in the past week 9/10 best: 5/10. Reports she has had about 3 falls in the past year, and is usually falling backwards. Reports she is very unsteady with stepping onto varied surfaces. Ambulates with a hurry cane in her LUE and uses her RUE to furiture walk, and has a RW that she will use when she has someone to put it in her car with her when she goes out. When she gets to a store, she uses cart to help her walk. She lives alone and completes her own cooking, cleaning, but has trouble standing for the time it takes to cook/do dishing. Patient reports she gives herself bird baths in her bathroom because she cannot step over her tub to get into her tub/shower that has a seat in it. She drives and completes her own errands. When her church was open she enjoyed attending, enjoys playing games on her  Ipad, and has friends in the community that come and check on her daily, as well as being apart of the "Are You Okay" program with the [place] police (they call daily and if you do not answer they will send the police there to check on you). No steps to enter 1 story home, has a ramp. Denies sensation deficits.  Pt denies N/V, B&B changes, unexplained weight fluctuation, saddle paresthesia, fever, night sweats, or unrelenting night pain at this time.    Limitations  Sitting;Lifting;Standing;Walking    How long can you sit comfortably?  without support 10-86mins    How long can you stand comfortably?  10-70mins    How long can you walk comfortably?  with cane 110mins    Patient Stated Goals  Walk better    Pain Onset  More than a month ago         Ther-Ex - Nustep seat setting 7 BUE 10 L1 39mins, for LE muscle activation with  AAROM spine rotation with no increased pain - STS from elevated mat table (attempted without UE support, unable min UE push off needed for initiation) 3x 5 with 3sec hold in standing to increase balance - Seated alt marching 2x 10 with cuing for posture to engage core with decent carry over following - Seated LAQ 2x 10 each LE with visual target for full motion, able to complete in full range on LLE, 80% on RLE , cuing for posture, decent carry over - Seated reaching outside BOS for weight shifting and mobility x6 with feet support each UE; x6 each UE without feet support; standing without support x6 each UE, difficulty with L wt shift - Standing hip ext 2x 5 each with BUE support, cuing for posture and glute activation with decent carry over                     PT Education - 01/05/19 1447    Education Details  therex form    Person(s) Educated  Patient    Methods  Explanation;Demonstration;Verbal cues    Comprehension  Verbalized understanding;Returned demonstration;Verbal cues required       PT Short Term Goals - 12/31/18 0840      PT SHORT TERM GOAL #1   Title  Pt will be independent with HEP in order to improve strength and decrease back pain in order to improve pain-free function at home and increase QOL    Baseline  12/30/18 HEP given    Time  4    Period  Weeks    Status  New      PT SHORT TERM GOAL #2   Title  Pt will improve BERG by at least 3 points in order to demonstrate clinically significant improvement in balance.    Baseline  12/30/18 27/56    Time  6    Period  Weeks    Status  New      PT SHORT TERM GOAL #3   Title  Pt will increase 10MWT by at least 0.13 m/s in order to demonstrate clinically significant improvement in community ambulation.    Baseline  12/30/18 0.18 m/s    Time  6      PT SHORT TERM GOAL #4   Title  Pt will decrease 5TSTS by at least 3 seconds in order to demonstrate clinically significant improvement in LE strength     Baseline  12/30/18 33sec    Time  6    Period  Weeks  Status  New        PT Long Term Goals - 12/31/18 0853      PT LONG TERM GOAL #1   Title  Patient will obtain a score of 46/56 on the BERG to demonstrate decreased fall risk    Baseline  12/30/18 27/56    Time  12    Period  Weeks    Status  New      PT LONG TERM GOAL #2   Title  Patient will demonstrate walk speed of 0.8-1.43m/s on 10MWT in order to demonstrate household ambulator walk speed with safety    Baseline  12/30/18 0.79m/s with HHA with SPC, needing min-modA to  Prevent LOB on 4 occasions    Time  12    Period  Weeks    Status  New      PT LONG TERM GOAL #3   Title  Patient will complete 5xSTS without UE support in 14.8sec in order to demonstrate age matched strength norms    Baseline  12/30/18 33sec with definite need for UE use    Time  12    Period  Weeks    Status  New      PT LONG TERM GOAL #4   Title  Patient will be able to ambulate 62ft with SPC without LOB/need for external support in order to demonstrate safety walking household distances/into story    Baseline  12/30/18 25ft with HHA with SPC, needing min-modA to  Prevent LOB on 4 occasions    Time  12    Period  Weeks    Status  New            Plan - 01/05/19 1507    Clinical Impression Statement  PT led patient through therex for LE strengthening and balance with good success. Patient is able to complete all therex with proper technique following cuing, and gaurding for safety. Patinet with better weight shifting this session than at evaluation with encouragement. PT will continue progression as able.    Personal Factors and Comorbidities  Age;Fitness;Comorbidity 1;Comorbidity 2;Comorbidity 3+;Past/Current Experience;Sex    Comorbidities  HTN, HLD, GERD, osteoporosis    Examination-Activity Limitations  Bathing;Sit;Dressing;Transfers;Bed Mobility;Bend;Lift;Carry;Reach Overhead;Stand;Stairs    Examination-Participation Restrictions   Church;Laundry;Cleaning;Community Activity;Meal Prep    Stability/Clinical Decision Making  Evolving/Moderate complexity    Clinical Decision Making  Moderate    Rehab Potential  Good    PT Frequency  2x / week    PT Duration  8 weeks    PT Treatment/Interventions  Moist Heat;Traction;Gait training;Stair training;Balance training;Dry needling;Joint Manipulations;Spinal Manipulations;Passive range of motion;Manual techniques;Patient/family education;Therapeutic exercise;Electrical Stimulation;ADLs/Self Care Home Management;Therapeutic activities;Functional mobility training;Energy conservation;Neuromuscular re-education    PT Next Visit Plan  Balance training, LE/core strengthening    PT Home Exercise Plan  STS from chair, seated hip abd, seated marching    Consulted and Agree with Plan of Care  Patient       Patient will benefit from skilled therapeutic intervention in order to improve the following deficits and impairments:  Abnormal gait, Decreased balance, Decreased endurance, Decreased mobility, Difficulty walking, Hypomobility, Cardiopulmonary status limiting activity, Decreased range of motion, Improper body mechanics, Decreased activity tolerance, Decreased coordination, Decreased safety awareness, Decreased strength, Increased fascial restricitons, Impaired flexibility, Postural dysfunction, Pain  Visit Diagnosis: Chronic midline low back pain without sciatica  Difficulty in walking, not elsewhere classified  Repeated falls     Problem List Patient Active Problem List   Diagnosis Date Noted  . Morbid  obesity ([place]) 04/08/2018  . Age-related osteoporosis without current pathological fracture 09/26/2017  . Pain in both hands 09/23/2017  . Hyperparathyroidism ([place]) 08/06/2017  . Chronic kidney disease, stage III (moderate) 08/06/2017  . HLD (hyperlipidemia) 07/25/2017  . Osteoporotic compression fracture of spine ([place]) 03/22/2017  . Assistance needed with transportation  03/21/2017  . Iron deficiency anemia 12/19/2016  . Aortic atherosclerosis ([place]) 11/23/2016  . Anterolisthesis 11/23/2016  . Chronic pain syndrome 09/13/2016  . Vitamin D deficiency 08/19/2016  . Anemia, unspecified 08/19/2016  . Primary open angle glaucoma (POAG) of both eyes, severe stage 08/17/2016  . Hypertensive retinopathy 08/17/2016  . Long term current use of opiate analgesic 07/19/2016  . Dyslipidemia associated with type 2 diabetes mellitus ([place]) 04/16/2016  . Chronic midline low back pain without sciatica 04/16/2016  . Chronic pain of right knee 04/16/2016  . Vaginal dryness 02/22/2015  . Calculus of kidney 11/10/2014  . Osteoarthritis, multiple sites 09/09/2014  . Type 2 diabetes mellitus with microalbuminuria ([place]) 09/09/2014  . Glaucoma 09/09/2014  . Benign hypertension with CKD (chronic kidney disease) stage III ([place]) 09/09/2014  . Chronic pain 09/09/2014  . Tinea corporis 09/09/2014  . Umbilical hernia [id number]  . Lump or mass in breast    Shelton Silvas PT, DPT Shelton Silvas 01/05/2019, 3:20 PM  [facility] [address] [place], [address] Phone: [phone]   Fax:  [phone]  Name: Taylor [name] MRN: [id number] Date of Birth: 09-01-1932

## 2019-01-07 ENCOUNTER — Encounter: Payer: Medicare Other | Admitting: Physical Therapy

## 2019-01-08 ENCOUNTER — Encounter: Payer: Self-pay | Admitting: Physical Therapy

## 2019-01-08 ENCOUNTER — Ambulatory Visit: Payer: Medicare Other | Admitting: Physical Therapy

## 2019-01-08 ENCOUNTER — Other Ambulatory Visit: Payer: Self-pay

## 2019-01-08 DIAGNOSIS — R262 Difficulty in walking, not elsewhere classified: Secondary | ICD-10-CM

## 2019-01-08 DIAGNOSIS — R296 Repeated falls: Secondary | ICD-10-CM | POA: Diagnosis not present

## 2019-01-08 DIAGNOSIS — G8929 Other chronic pain: Secondary | ICD-10-CM

## 2019-01-08 DIAGNOSIS — M545 Low back pain: Secondary | ICD-10-CM | POA: Diagnosis not present

## 2019-01-08 NOTE — Therapy (Signed)
[facility] [address], [place], [address] Phone: [phone]   Fax:  [phone]  Physical Therapy Treatment  Patient Details  Name: Taylor [name] MRN: [id number] Date of Birth: [birth year] No data recorded  Encounter Date: 01/08/2019  PT End of Session - 01/08/19 1609    Visit Number  3    Number of Visits  17    Date for PT Re-Evaluation  02/24/19    PT Start Time  0400    PT Stop Time  0445    PT Time Calculation (min)  45 min    Activity Tolerance  Patient tolerated treatment well    Behavior During Therapy  [facility] for tasks assessed/performed       Past Medical History:  Diagnosis Date  . Abnormal mammogram, unspecified 2013  . Breast screening, unspecified 2013  . Diabetes mellitus without complication ([place]) [id number]   non insulin dependent  . GERD (gastroesophageal reflux disease)   . Glaucoma 2003  . Gout   . Hyperlipidemia 2008  . Hypertension 1980's  . Lump or mass in breast 01/03/2012   left breast  . Obesity, unspecified 2013  . Osteoporosis   . Shingles 2013  . Special screening for malignant neoplasms, colon 2013    Past Surgical History:  Procedure Laterality Date  . ABDOMINAL HYSTERECTOMY  1958   menorrhagia  . BACK SURGERY    . BREAST BIOPSY Left 01/25/2012  . CATARACT EXTRACTION, BILATERAL Bilateral    1st 06/07/15 and the 2nd 06/21/15  . CHOLECYSTECTOMY    . COLONOSCOPY  2003   [facility]  . CORONARY ARTERY BYPASS GRAFT Left 01/03/2012,01/24/2012   Left FNA, Left breast [facility] bx  . KNEE SURGERY  2009,2011,2012   twice on right and once on left  . KNEE SURGERY    . LIPOMA EXCISION  1998  . [employer]  2004  . TONSILLECTOMY     age of 16    There were no vitals filed for this visit.  Subjective Assessment - 01/08/19 1605    Subjective  Reports 5/10 back pain. Did well after last sesion, no falls since.    Pertinent History  Patinet is a 85 year old female presenting  to clinic for LBP. Patient has LBP that bothers her when she stands up for 10-29mins for ADLS. Back pain does not radiate and feels achy. She can only sit without back support for 64mins without pain. Worst pain in the past week 9/10 best: 5/10. Reports she has had about 3 falls in the past year, and is usually falling backwards. Reports she is very unsteady with stepping onto varied surfaces. Ambulates with a hurry cane in her LUE and uses her RUE to furiture walk, and has a RW that she will use when she has someone to put it in her car with her when she goes out. When she gets to a store, she uses cart to help her walk. She lives alone and completes her own cooking, cleaning, but has trouble standing for the time it takes to cook/do dishing. Patient reports she gives herself bird baths in her bathroom because she cannot step over her tub to get into her tub/shower that has a seat in it. She drives and completes her own errands. When her church was open she enjoyed attending, enjoys playing games on her Ipad, and has friends in the community that come and check on her daily, as well as being  apart of the "Are You Okay" program with the [place] police (they call daily and if you do not answer they will send the police there to check on you). No steps to enter 1 story home, has a ramp. Denies sensation deficits.  Pt denies N/V, B&B changes, unexplained weight fluctuation, saddle paresthesia, fever, night sweats, or unrelenting night pain at this time.    Limitations  Sitting;Lifting;Standing;Walking    How long can you sit comfortably?  without support 10-37mins    How long can you stand comfortably?  10-69mins    How long can you walk comfortably?  with cane 56mins    Patient Stated Goals  Walk better           Ther-Ex  - Nustep seat setting 7 BUE 10 L1 10mins, L2 23mins for LE muscle activation with AAROM spine rotation with no increased pain; SPM over 60 - Step up/down onto foam pad with BUE support 2x  10 with cuing for full hip ext and forward wt shift with good carry over - Standing hip ext 2x 10 each with BUE support, cuing initially to prevent forward lean with good carry over, able to take standing rest break between sets - Standing heel raises 50% raise with BUE support 2x 10 with cuing to try to push "straight up" - Seated hip abd RTB 2x 10 with min cuing for posture, decent carry  - Seated marching with feet on 4in step 2x 10 each LE with cuing for core activation and posture with decent carry over (inc difficulty with RLE elevation                       PT Education - 01/08/19 1608    Education Details  therex form    Person(s) Educated  Patient    Methods  Explanation;Demonstration;Tactile cues;Verbal cues    Comprehension  Verbalized understanding;Returned demonstration;Tactile cues required;Verbal cues required       PT Short Term Goals - 12/31/18 0840      PT SHORT TERM GOAL #1   Title  Pt will be independent with HEP in order to improve strength and decrease back pain in order to improve pain-free function at home and increase QOL    Baseline  12/30/18 HEP given    Time  4    Period  Weeks    Status  New      PT SHORT TERM GOAL #2   Title  Pt will improve BERG by at least 3 points in order to demonstrate clinically significant improvement in balance.    Baseline  12/30/18 27/56    Time  6    Period  Weeks    Status  New      PT SHORT TERM GOAL #3   Title  Pt will increase 10MWT by at least 0.13 m/s in order to demonstrate clinically significant improvement in community ambulation.    Baseline  12/30/18 0.18 m/s    Time  6      PT SHORT TERM GOAL #4   Title  Pt will decrease 5TSTS by at least 3 seconds in order to demonstrate clinically significant improvement in LE strength    Baseline  12/30/18 33sec    Time  6    Period  Weeks    Status  New        PT Long Term Goals - 12/31/18 [id number]      PT LONG TERM GOAL #1   Title  Patient will  obtain a score of 46/56 on the BERG to demonstrate decreased fall risk    Baseline  12/30/18 27/56    Time  12    Period  Weeks    Status  New      PT LONG TERM GOAL #2   Title  Patient will demonstrate walk speed of 0.8-1.29m/s on 10MWT in order to demonstrate household ambulator walk speed with safety    Baseline  12/30/18 0.71m/s with HHA with SPC, needing min-modA to  Prevent LOB on 4 occasions    Time  12    Period  Weeks    Status  New      PT LONG TERM GOAL #3   Title  Patient will complete 5xSTS without UE support in 14.8sec in order to demonstrate age matched strength norms    Baseline  12/30/18 33sec with definite need for UE use    Time  12    Period  Weeks    Status  New      PT LONG TERM GOAL #4   Title  Patient will be able to ambulate 51ft with SPC without LOB/need for external support in order to demonstrate safety walking household distances/into story    Baseline  12/30/18 45ft with HHA with SPC, needing min-modA to  Prevent LOB on 4 occasions    Time  12    Period  Weeks    Status  New            Plan - 01/08/19 1617    Clinical Impression Statement  PT continued progression for LE and core strength and balance with good success. Patient is able to tolerate more standing between sets and through therex this session before fatigue. Patient motivated throughout session. Patient requires cuing for proper technique, and gaurding for safety, and is able to comply with all cuing for technique and safety. PT will continue progression as able.    Personal Factors and Comorbidities  Age;Fitness;Comorbidity 1;Comorbidity 2;Comorbidity 3+;Past/Current Experience;Sex    Comorbidities  HTN, HLD, GERD, osteoporosis    Examination-Activity Limitations  Bathing;Sit;Dressing;Transfers;Bed Mobility;Bend;Lift;Carry;Reach Overhead;Stand;Stairs    Examination-Participation Restrictions  Church;Laundry;Cleaning;Community Activity;Meal Prep    Stability/Clinical Decision Making   Evolving/Moderate complexity    Clinical Decision Making  Moderate    Rehab Potential  Good    PT Frequency  2x / week    PT Duration  8 weeks    PT Treatment/Interventions  Moist Heat;Traction;Gait training;Stair training;Balance training;Dry needling;Joint Manipulations;Spinal Manipulations;Passive range of motion;Manual techniques;Patient/family education;Therapeutic exercise;Electrical Stimulation;ADLs/Self Care Home Management;Therapeutic activities;Functional mobility training;Energy conservation;Neuromuscular re-education    PT Next Visit Plan  Balance training, LE/core strengthening    PT Home Exercise Plan  STS from chair, seated hip abd, seated marching    Consulted and Agree with Plan of Care  Patient       Patient will benefit from skilled therapeutic intervention in order to improve the following deficits and impairments:  Abnormal gait, Decreased balance, Decreased endurance, Decreased mobility, Difficulty walking, Hypomobility, Cardiopulmonary status limiting activity, Decreased range of motion, Improper body mechanics, Decreased activity tolerance, Decreased coordination, Decreased safety awareness, Decreased strength, Increased fascial restricitons, Impaired flexibility, Postural dysfunction, Pain  Visit Diagnosis: Chronic midline low back pain without sciatica  Difficulty in walking, not elsewhere classified  Repeated falls     Problem List Patient Active Problem List   Diagnosis Date Noted  . Morbid obesity ([place]) 04/08/2018  . Age-related osteoporosis without current pathological fracture 09/26/2017  . Pain in  both hands 09/23/2017  . Hyperparathyroidism ([place]) 08/06/2017  . Chronic kidney disease, stage III (moderate) 08/06/2017  . HLD (hyperlipidemia) 07/25/2017  . Osteoporotic compression fracture of spine ([place]) 03/22/2017  . Assistance needed with transportation 03/21/2017  . Iron deficiency anemia 12/19/2016  . Aortic atherosclerosis ([place]) 11/23/2016  .  Anterolisthesis 11/23/2016  . Chronic pain syndrome 09/13/2016  . Vitamin D deficiency 08/19/2016  . Anemia, unspecified 08/19/2016  . Primary open angle glaucoma (POAG) of both eyes, severe stage 08/17/2016  . Hypertensive retinopathy 08/17/2016  . Long term current use of opiate analgesic 07/19/2016  . Dyslipidemia associated with type 2 diabetes mellitus ([place]) 04/16/2016  . Chronic midline low back pain without sciatica 04/16/2016  . Chronic pain of right knee 04/16/2016  . Vaginal dryness 02/22/2015  . Calculus of kidney 11/10/2014  . Osteoarthritis, multiple sites 09/09/2014  . Type 2 diabetes mellitus with microalbuminuria ([place]) 09/09/2014  . Glaucoma 09/09/2014  . Benign hypertension with CKD (chronic kidney disease) stage III ([place]) 09/09/2014  . Chronic pain 09/09/2014  . Tinea corporis 09/09/2014  . Umbilical hernia [id number]  . Lump or mass in breast    Shelton Silvas PT, DPT Shelton Silvas 01/08/2019, 4:52 PM  [facility] [address] [place], [address] Phone: [phone]   Fax:  [phone]  Name: Taylor [name] MRN: [id number] Date of Birth: 04/02/1932

## 2019-01-12 ENCOUNTER — Encounter: Payer: Self-pay | Admitting: Anesthesiology

## 2019-01-12 ENCOUNTER — Other Ambulatory Visit: Payer: Self-pay

## 2019-01-12 ENCOUNTER — Ambulatory Visit: Payer: Medicare Other | Attending: Anesthesiology | Admitting: Anesthesiology

## 2019-01-12 DIAGNOSIS — Z79891 Long term (current) use of opiate analgesic: Secondary | ICD-10-CM

## 2019-01-12 DIAGNOSIS — M79641 Pain in right hand: Secondary | ICD-10-CM | POA: Diagnosis not present

## 2019-01-12 DIAGNOSIS — M159 Polyosteoarthritis, unspecified: Secondary | ICD-10-CM

## 2019-01-12 DIAGNOSIS — G894 Chronic pain syndrome: Secondary | ICD-10-CM | POA: Diagnosis not present

## 2019-01-12 DIAGNOSIS — G8929 Other chronic pain: Secondary | ICD-10-CM

## 2019-01-12 DIAGNOSIS — M25561 Pain in right knee: Secondary | ICD-10-CM

## 2019-01-12 DIAGNOSIS — M25562 Pain in left knee: Secondary | ICD-10-CM

## 2019-01-12 DIAGNOSIS — M79642 Pain in left hand: Secondary | ICD-10-CM

## 2019-01-12 DIAGNOSIS — M545 Low back pain: Secondary | ICD-10-CM | POA: Diagnosis not present

## 2019-01-12 MED ORDER — OXYCODONE-ACETAMINOPHEN 7.5-325 MG PO TABS: 1.0000 | ORAL_TABLET | Freq: Three times a day (TID) | ORAL | 0 refills | Status: AC | PRN

## 2019-01-12 MED ORDER — OXYCODONE-ACETAMINOPHEN 7.5-325 MG PO TABS: 1.0000 | ORAL_TABLET | Freq: Three times a day (TID) | ORAL | 0 refills | Status: DC | PRN

## 2019-01-12 NOTE — Progress Notes (Signed)
Virtual Visit via Telephone Note  I connected with Taylor [name] on 01/12/19 at  2:45 PM EST by telephone and verified that I am speaking with the correct person using two identifiers.  Location: Patient: Home Provider: Pain control center   I discussed the limitations, risks, security and privacy concerns of performing an evaluation and management service by telephone and the availability of in person appointments. I also discussed with the patient that there may be a patient responsible charge related to this service. The patient expressed understanding and agreed to proceed.   History of Present Illness: I spoke with Taylor [name] today for her virtual conference via telephone and she states that she is continuing to have diffuse joint pain similar to what she has previously experienced but this is better controlled with her current opioid medications.  She denies any side effects with the medicines and continues to get a 50% or so improvement with the medicines as opposed to more conservative therapy.  She denies any side effects with the medicines as reported today.  She continues to get better sleep and derives good functional benefit with them as well.  Otherwise she is in her usual state of health today with no contributory changes.  The quality characteristic and distribution of the pain also have been stable in nature.    Observations/Objective: Current Outpatient Medications:  .  acetaminophen (TYLENOL) 500 MG tablet, Take 1 tablet (500 mg total) by mouth 3 (three) times daily. (Patient taking differently: Take 500 mg by mouth as needed. ), Disp: 90 tablet, Rfl: 0 .  amLODipine (NORVASC) 10 MG tablet, Take 1 tablet (10 mg total) by mouth daily., Disp: 90 tablet, Rfl: 1 .  AZOPT 1 % ophthalmic suspension, INSTILL 1 DROP INTO BOTH EYES TWICE A DAY, Disp: , Rfl:  .  bimatoprost (LUMIGAN) 0.01 % SOLN, Place 1 drop into both eyes daily. , Disp: , Rfl:  .  brimonidine-timolol (COMBIGAN)  0.2-0.5 % ophthalmic solution, Place 1 drop into both eyes See admin instructions. 1 drop into both eyes in the morning, then 1 drop into both eyes 8 hours later, Disp: , Rfl:  .  Calcium Carbonate-Vitamin D (OYSTER SHELL CALCIUM 500 + D PO), Take 1 tablet by mouth 2 (two) times a day., Disp: , Rfl:  .  Cholecalciferol (VITAMIN D-1000 MAX ST) 1000 UNITS tablet, Take 1,000 Units by mouth daily. Reported on 08/05/2015, Disp: , Rfl:  .  famotidine (PEPCID) 20 MG tablet, Take 1 tablet (20 mg total) by mouth daily., Disp: 90 tablet, Rfl: 1 .  ferrous sulfate 325 (65 FE) MG tablet, Take 1 tablet (325 mg total) by mouth daily with breakfast., Disp: 90 tablet, Rfl: 1 .  fluticasone (FLONASE) 50 MCG/ACT nasal spray, SPRAY 2 SPRAYS INTO EACH NOSTRIL EVERY DAY (Patient taking differently: PRN), Disp: 16 g, Rfl: 1 .  glucose blood test strip, Use as instructed, Disp: 100 each, Rfl: 12 .  metFORMIN (GLUCOPHAGE-XR) 750 MG 24 hr tablet, Take 2 tablets (1,500 mg total) by mouth daily with breakfast., Disp: 180 tablet, Rfl: 1 .  olmesartan (BENICAR) 20 MG tablet, Take 1 tablet (20 mg total) by mouth daily., Disp: 90 tablet, Rfl: 1 .  omega-3 acid ethyl esters (LOVAZA) 1 g capsule, TAKE 1 CAPSULE (1 G TOTAL) BY MOUTH 2 (TWO) TIMES DAILY., Disp: 180 capsule, Rfl: 0 .  OneTouch Delica Lancets [id number] MISC, 1 each by Does not apply route daily., Disp: 100 each, Rfl: 1 .  [START ON  02/02/2019] oxyCODONE-acetaminophen (PERCOCET) 7.5-325 MG tablet, Take 1 tablet by mouth every 8 (eight) hours as needed for moderate pain or severe pain., Disp: 90 tablet, Rfl: 0 .  [START ON 03/04/2019] oxyCODONE-acetaminophen (PERCOCET) 7.5-325 MG tablet, Take 1 tablet by mouth every 8 (eight) hours as needed for moderate pain or severe pain., Disp: 90 tablet, Rfl: 0 .  pravastatin (PRAVACHOL) 40 MG tablet, Take 1 tablet (40 mg total) by mouth daily., Disp: 90 tablet, Rfl: 1 .  PREMARIN vaginal cream, PLACE 1 APPLICATORFUL VAGINALLY DAILY., Disp: 30  g, Rfl: 5   Assessment and Plan: 1. Chronic pain syndrome   2. Chronic pain of right knee   3. Pain in both hands   4. Chronic midline low back pain without sciatica   5. Long term prescription opiate use   6. Chronic pain of both knees   7. Osteoarthritis of multiple joints, unspecified osteoarthritis type   Based on our discussion today and upon review of the [facility] practitioner database information about a refill her medications for December 14 and January 13.  I will schedule her for a 22-month return to clinic and she is to continue follow-up with her primary care physicians for her baseline medical care.  She is instructed to contact the pain control center for any of her pain related questions.    Follow Up Instructions:    I discussed the assessment and treatment plan with the patient. The patient was provided an opportunity to ask questions and all were answered. The patient agreed with the plan and demonstrated an understanding of the instructions.   The patient was advised to call back or seek an in-person evaluation if the symptoms worsen or if the condition fails to improve as anticipated.  I provided 30 minutes of non-face-to-face time during this encounter.   [name], MD

## 2019-01-13 ENCOUNTER — Encounter: Payer: Medicare Other | Admitting: Physical Therapy

## 2019-01-14 ENCOUNTER — Ambulatory Visit: Payer: Medicare Other | Admitting: Physical Therapy

## 2019-01-20 ENCOUNTER — Ambulatory Visit: Payer: Medicare Other | Admitting: Physical Therapy

## 2019-01-20 DIAGNOSIS — H401122 Primary open-angle glaucoma, left eye, moderate stage: Secondary | ICD-10-CM | POA: Diagnosis not present

## 2019-01-20 DIAGNOSIS — H401113 Primary open-angle glaucoma, right eye, severe stage: Secondary | ICD-10-CM | POA: Diagnosis not present

## 2019-01-20 DIAGNOSIS — Z961 Presence of intraocular lens: Secondary | ICD-10-CM | POA: Diagnosis not present

## 2019-01-20 DIAGNOSIS — H35372 Puckering of macula, left eye: Secondary | ICD-10-CM | POA: Diagnosis not present

## 2019-01-22 ENCOUNTER — Ambulatory Visit: Payer: Medicare Other | Attending: Family Medicine | Admitting: Physical Therapy

## 2019-01-22 ENCOUNTER — Other Ambulatory Visit: Payer: Self-pay

## 2019-01-22 ENCOUNTER — Encounter: Payer: Self-pay | Admitting: Physical Therapy

## 2019-01-22 DIAGNOSIS — G8929 Other chronic pain: Secondary | ICD-10-CM | POA: Insufficient documentation

## 2019-01-22 DIAGNOSIS — M545 Low back pain, unspecified: Secondary | ICD-10-CM

## 2019-01-22 DIAGNOSIS — R262 Difficulty in walking, not elsewhere classified: Secondary | ICD-10-CM | POA: Diagnosis present

## 2019-01-22 DIAGNOSIS — R296 Repeated falls: Secondary | ICD-10-CM

## 2019-01-22 NOTE — Therapy (Signed)
[facility] [address], [place], [address] Phone: [phone]   Fax:  [phone]  Physical Therapy Treatment  Patient Details  Name: Taylor [name] MRN: [id number] Date of Birth: [birth year] No data recorded  Encounter Date: 01/22/2019  PT End of Session - 01/22/19 1352    Visit Number  4    Number of Visits  17    Date for PT Re-Evaluation  02/24/19    PT Start Time  0145    PT Stop Time  0230    PT Time Calculation (min)  45 min    Activity Tolerance  Patient tolerated treatment well    Behavior During Therapy  [facility] for tasks assessed/performed       Past Medical History:  Diagnosis Date  . Abnormal mammogram, unspecified 2013  . Breast screening, unspecified 2013  . Diabetes mellitus without complication ([place]) [id number]   non insulin dependent  . GERD (gastroesophageal reflux disease)   . Glaucoma 2003  . Gout   . Hyperlipidemia 2008  . Hypertension 1980's  . Lump or mass in breast 01/03/2012   left breast  . Obesity, unspecified 2013  . Osteoporosis   . Shingles 2013  . Special screening for malignant neoplasms, colon 2013    Past Surgical History:  Procedure Laterality Date  . ABDOMINAL HYSTERECTOMY  1958   menorrhagia  . BACK SURGERY    . BREAST BIOPSY Left 01/25/2012  . CATARACT EXTRACTION, BILATERAL Bilateral    1st 06/07/15 and the 2nd 06/21/15  . CHOLECYSTECTOMY    . COLONOSCOPY  2003   [facility]  . CORONARY ARTERY BYPASS GRAFT Left 01/03/2012,01/24/2012   Left FNA, Left breast [facility] bx  . KNEE SURGERY  2009,2011,2012   twice on right and once on left  . KNEE SURGERY    . LIPOMA EXCISION  1998  . [place]  2004  . TONSILLECTOMY     age of 85    There were no vitals filed for this visit.  Subjective Assessment - 01/22/19 1350    Subjective  No LOB over the past week. Reports her back and R knee have been bothering more.    Pertinent History  Patinet is a 85 year old female  presenting to clinic for LBP. Patient has LBP that bothers her when she stands up for 10-59mins for ADLS. Back pain does not radiate and feels achy. She can only sit without back support for 89mins without pain. Worst pain in the past week 9/10 best: 5/10. Reports she has had about 3 falls in the past year, and is usually falling backwards. Reports she is very unsteady with stepping onto varied surfaces. Ambulates with a hurry cane in her LUE and uses her RUE to furiture walk, and has a RW that she will use when she has someone to put it in her car with her when she goes out. When she gets to a store, she uses cart to help her walk. She lives alone and completes her own cooking, cleaning, but has trouble standing for the time it takes to cook/do dishing. Patient reports she gives herself bird baths in her bathroom because she cannot step over her tub to get into her tub/shower that has a seat in it. She drives and completes her own errands. When her church was open she enjoyed attending, enjoys playing games on her Ipad, and has friends in the community that come and check on her daily,  as well as being apart of the "Are You Okay" program with the [place] police (they call daily and if you do not answer they will send the police there to check on you). No steps to enter 1 story home, has a ramp. Denies sensation deficits.  Pt denies N/V, B&B changes, unexplained weight fluctuation, saddle paresthesia, fever, night sweats, or unrelenting night pain at this time.    Limitations  Sitting;Lifting;Standing;Walking    How long can you sit comfortably?  without support 10-49mins    How long can you stand comfortably?  10-38mins    How long can you walk comfortably?  with cane 60mins    Patient Stated Goals  Walk better    Pain Onset  More than a month ago       Ther-Ex  - Nustep seat setting 7 BUE 10 L2 81mins for LE muscle activation with AAROM spine rotation with no increased pain; SPM over 60 - Standing hip ext  2x 10 each with BUE support, cuing initially to prevent forward lean, with foot clearance,  with good carry over, able to take standing rest break between sets - Side step to R and to L 2 x10 with BUE support with good carry over of foot clearance, need for UE suppport - 4 square step 2x clockwise/counterclockwise with seated break between clockwise/counterclockwise, increased time needed, CGA with minA needed to establish balance with reverse step, good carry over of foot clearance and step length with cuing. SPC in LUE, RUE outstretched for balance - Seated marching with feet on form 2x 10 with cuing for posture and wt shifting with opposite LE lifit with decent carry over; difficulty with RLE lift                        PT Education - 01/22/19 1351    Education Details  therex form    Person(s) Educated  Patient    Methods  Explanation;Demonstration;Tactile cues;Verbal cues    Comprehension  Verbalized understanding;Returned demonstration;Verbal cues required;Tactile cues required       PT Short Term Goals - 12/31/18 0840      PT SHORT TERM GOAL #1   Title  Pt will be independent with HEP in order to improve strength and decrease back pain in order to improve pain-free function at home and increase QOL    Baseline  12/30/18 HEP given    Time  4    Period  Weeks    Status  New      PT SHORT TERM GOAL #2   Title  Pt will improve BERG by at least 3 points in order to demonstrate clinically significant improvement in balance.    Baseline  12/30/18 27/56    Time  6    Period  Weeks    Status  New      PT SHORT TERM GOAL #3   Title  Pt will increase 10MWT by at least 0.13 m/s in order to demonstrate clinically significant improvement in community ambulation.    Baseline  12/30/18 0.18 m/s    Time  6      PT SHORT TERM GOAL #4   Title  Pt will decrease 5TSTS by at least 3 seconds in order to demonstrate clinically significant improvement in LE strength     Baseline  12/30/18 33sec    Time  6    Period  Weeks    Status  New  PT Long Term Goals - 12/31/18 0853      PT LONG TERM GOAL #1   Title  Patient will obtain a score of 46/56 on the BERG to demonstrate decreased fall risk    Baseline  12/30/18 27/56    Time  12    Period  Weeks    Status  New      PT LONG TERM GOAL #2   Title  Patient will demonstrate walk speed of 0.8-1.23m/s on 10MWT in order to demonstrate household ambulator walk speed with safety    Baseline  12/30/18 0.36m/s with HHA with SPC, needing min-modA to  Prevent LOB on 4 occasions    Time  12    Period  Weeks    Status  New      PT LONG TERM GOAL #3   Title  Patient will complete 5xSTS without UE support in 14.8sec in order to demonstrate age matched strength norms    Baseline  12/30/18 33sec with definite need for UE use    Time  12    Period  Weeks    Status  New      PT LONG TERM GOAL #4   Title  Patient will be able to ambulate 30ft with SPC without LOB/need for external support in order to demonstrate safety walking household distances/into story    Baseline  12/30/18 24ft with HHA with SPC, needing min-modA to  Prevent LOB on 4 occasions    Time  12    Period  Weeks    Status  New            Plan - 01/22/19 1404    Clinical Impression Statement  PT continued therex progression for LE and core strengthening, needed for balance safety. Patient with increased fatigue this session following 2 week absence d/t holidays with family. Pt requires gaurding for safety throughout session and cuing for proper technique with good carry over. PT will continue progression as able.    Personal Factors and Comorbidities  Age;Fitness;Comorbidity 1;Comorbidity 2;Comorbidity 3+;Past/Current Experience;Sex    Comorbidities  HTN, HLD, GERD, osteoporosis    Examination-Activity Limitations  Bathing;Sit;Dressing;Transfers;Bed Mobility;Bend;Lift;Carry;Reach Overhead;Stand;Stairs    Examination-Participation  Restrictions  Church;Laundry;Cleaning;Community Activity;Meal Prep    Stability/Clinical Decision Making  Evolving/Moderate complexity    Clinical Decision Making  Moderate    Rehab Potential  Good    PT Frequency  2x / week    PT Duration  8 weeks    PT Treatment/Interventions  Moist Heat;Traction;Gait training;Stair training;Balance training;Dry needling;Joint Manipulations;Spinal Manipulations;Passive range of motion;Manual techniques;Patient/family education;Therapeutic exercise;Electrical Stimulation;ADLs/Self Care Home Management;Therapeutic activities;Functional mobility training;Energy conservation;Neuromuscular re-education    PT Next Visit Plan  Balance training, LE/core strengthening    PT Home Exercise Plan  STS from chair, seated hip abd, seated marching    Consulted and Agree with Plan of Care  Patient       Patient will benefit from skilled therapeutic intervention in order to improve the following deficits and impairments:  Abnormal gait, Decreased balance, Decreased endurance, Decreased mobility, Difficulty walking, Hypomobility, Cardiopulmonary status limiting activity, Decreased range of motion, Improper body mechanics, Decreased activity tolerance, Decreased coordination, Decreased safety awareness, Decreased strength, Increased fascial restricitons, Impaired flexibility, Postural dysfunction, Pain  Visit Diagnosis: Chronic midline low back pain without sciatica  Difficulty in walking, not elsewhere classified  Repeated falls     Problem List Patient Active Problem List   Diagnosis Date Noted  . Morbid obesity ([place]) 04/08/2018  . Age-related osteoporosis without  current pathological fracture 09/26/2017  . Pain in both hands 09/23/2017  . Hyperparathyroidism ([place]) 08/06/2017  . Chronic kidney disease, stage III (moderate) 08/06/2017  . HLD (hyperlipidemia) 07/25/2017  . Osteoporotic compression fracture of spine ([place]) 03/22/2017  . Assistance needed with  transportation 03/21/2017  . Iron deficiency anemia 12/19/2016  . Aortic atherosclerosis ([place]) 11/23/2016  . Anterolisthesis 11/23/2016  . Chronic pain syndrome 09/13/2016  . Vitamin D deficiency 08/19/2016  . Anemia, unspecified 08/19/2016  . Primary open angle glaucoma (POAG) of both eyes, severe stage 08/17/2016  . Hypertensive retinopathy 08/17/2016  . Long term current use of opiate analgesic 07/19/2016  . Dyslipidemia associated with type 2 diabetes mellitus ([place]) 04/16/2016  . Chronic midline low back pain without sciatica 04/16/2016  . Chronic pain of right knee 04/16/2016  . Vaginal dryness 02/22/2015  . Calculus of kidney 11/10/2014  . Osteoarthritis, multiple sites 09/09/2014  . Type 2 diabetes mellitus with microalbuminuria ([place]) 09/09/2014  . Glaucoma 09/09/2014  . Benign hypertension with CKD (chronic kidney disease) stage III ([place]) 09/09/2014  . Chronic pain 09/09/2014  . Tinea corporis 09/09/2014  . Umbilical hernia [id number]  . Lump or mass in breast    Shelton Silvas PT, DPT Shelton Silvas 01/22/2019, 2:30 PM  [facility] [address], [place], [address] Phone: [phone]   Fax:  [phone]  Name: JARRELL MANZANARES MRN: [id number] Date of Birth: [birth year]

## 2019-01-27 ENCOUNTER — Other Ambulatory Visit: Payer: Self-pay

## 2019-01-27 ENCOUNTER — Encounter: Payer: Self-pay | Admitting: Physical Therapy

## 2019-01-27 ENCOUNTER — Ambulatory Visit: Payer: Medicare Other | Admitting: Physical Therapy

## 2019-01-27 DIAGNOSIS — M545 Low back pain: Secondary | ICD-10-CM | POA: Diagnosis not present

## 2019-01-27 DIAGNOSIS — R262 Difficulty in walking, not elsewhere classified: Secondary | ICD-10-CM

## 2019-01-27 DIAGNOSIS — G8929 Other chronic pain: Secondary | ICD-10-CM

## 2019-01-27 DIAGNOSIS — R296 Repeated falls: Secondary | ICD-10-CM

## 2019-01-27 NOTE — Therapy (Signed)
[facility] [address] [place], [address] Phone: [phone]   Fax:  [phone]  Physical Therapy Treatment  Patient Details  Name: Taylor [name] MRN: [id number] Date of Birth: December 11, 1932 No data recorded  Encounter Date: 01/27/2019  PT End of Session - 01/27/19 1134    Visit Number  5    Number of Visits  17    Date for PT Re-Evaluation  02/24/19    PT Start Time  1110    PT Stop Time  1150    PT Time Calculation (min)  40 min    Activity Tolerance  Patient tolerated treatment well    Behavior During Therapy  [facility] for tasks assessed/performed       Past Medical History:  Diagnosis Date  . Abnormal mammogram, unspecified 2013  . Breast screening, unspecified 2013  . Diabetes mellitus without complication ([place]) [id number]   non insulin dependent  . GERD (gastroesophageal reflux disease)   . Glaucoma 2003  . Gout   . Hyperlipidemia 2008  . Hypertension 1980's  . Lump or mass in breast 01/03/2012   left breast  . Obesity, unspecified 2013  . Osteoporosis   . Shingles 2013  . Special screening for malignant neoplasms, colon 2013    Past Surgical History:  Procedure Laterality Date  . ABDOMINAL HYSTERECTOMY  1958   menorrhagia  . BACK SURGERY    . BREAST BIOPSY Left 01/25/2012  . CATARACT EXTRACTION, BILATERAL Bilateral    1st 06/07/15 and the 2nd 06/21/15  . CHOLECYSTECTOMY    . COLONOSCOPY  2003   [facility]  . CORONARY ARTERY BYPASS GRAFT Left 01/03/2012,01/24/2012   Left FNA, Left breast [facility] bx  . KNEE SURGERY  2009,2011,2012   twice on right and once on left  . KNEE SURGERY    . LIPOMA EXCISION  1998  . [place]  2004  . TONSILLECTOMY     age of 85    There were no vitals filed for this visit.  Subjective Assessment - 01/27/19 1116    Subjective  No pain or falls since last appt. Reports she is having some soreness in her arms today. Compliance with HEP    Pertinent History   Patinet is a 85 year old female presenting to clinic for LBP. Patient has LBP that bothers her when she stands up for 10-40mins for ADLS. Back pain does not radiate and feels achy. She can only sit without back support for 76mins without pain. Worst pain in the past week 9/10 best: 5/10. Reports she has had about 3 falls in the past year, and is usually falling backwards. Reports she is very unsteady with stepping onto varied surfaces. Ambulates with a hurry cane in her LUE and uses her RUE to furiture walk, and has a RW that she will use when she has someone to put it in her car with her when she goes out. When she gets to a store, she uses cart to help her walk. She lives alone and completes her own cooking, cleaning, but has trouble standing for the time it takes to cook/do dishing. Patient reports she gives herself bird baths in her bathroom because she cannot step over her tub to get into her tub/shower that has a seat in it. She drives and completes her own errands. When her church was open she enjoyed attending, enjoys playing games on her Ipad, and has friends in the community that come and  check on her daily, as well as being apart of the "Are You Okay" program with the [place] police (they call daily and if you do not answer they will send the police there to check on you). No steps to enter 1 story home, has a ramp. Denies sensation deficits.  Pt denies N/V, B&B changes, unexplained weight fluctuation, saddle paresthesia, fever, night sweats, or unrelenting night pain at this time.    Limitations  Sitting;Lifting;Standing;Walking    How long can you sit comfortably?  without support 10-64mins    Pain Onset  More than a month ago       Ther-Ex - Nustep seat setting 7 BUE 10L2 8mins; L3 44minsfor LE muscle activation with AAROM spine rotation with no increased pain; SPM over 60 -  Mini squat with BUE supported at treadmill bar 3x 5 - Step up onto 3in step 2x 10 (5x RLE 5xLLE) with cuing for full  hip ext and "push straight up" as opposed to pull forward; patient slow and steady with step ups  - Side stepping 5 steps R 5 steps L x2 with modA with gait belt for wt shifting; patient very slow with stepping, very fearful of falling - Seated marching with feet on form 2x 10 with cuing for posture and wt shifting with opposite LE lifit with decent carry over; difficulty with RLE lift                         PT Education - 01/27/19 1120    Education Details  therex form    Person(s) Educated  Patient    Methods  Explanation;Demonstration;Tactile cues;Verbal cues    Comprehension  Verbalized understanding;Returned demonstration;Verbal cues required;Tactile cues required       PT Short Term Goals - 12/31/18 0840      PT SHORT TERM GOAL #1   Title  Pt will be independent with HEP in order to improve strength and decrease back pain in order to improve pain-free function at home and increase QOL    Baseline  12/30/18 HEP given    Time  4    Period  Weeks    Status  New      PT SHORT TERM GOAL #2   Title  Pt will improve BERG by at least 3 points in order to demonstrate clinically significant improvement in balance.    Baseline  12/30/18 27/56    Time  6    Period  Weeks    Status  New      PT SHORT TERM GOAL #3   Title  Pt will increase 10MWT by at least 0.13 m/s in order to demonstrate clinically significant improvement in community ambulation.    Baseline  12/30/18 0.18 m/s    Time  6      PT SHORT TERM GOAL #4   Title  Pt will decrease 5TSTS by at least 3 seconds in order to demonstrate clinically significant improvement in LE strength    Baseline  12/30/18 33sec    Time  6    Period  Weeks    Status  New        PT Long Term Goals - 12/31/18 0853      PT LONG TERM GOAL #1   Title  Patient will obtain a score of 46/56 on the BERG to demonstrate decreased fall risk    Baseline  12/30/18 27/56    Time  12    Period  Weeks    Status  New      PT  LONG TERM GOAL #2   Title  Patient will demonstrate walk speed of 0.8-1.16m/s on 10MWT in order to demonstrate household ambulator walk speed with safety    Baseline  12/30/18 0.40m/s with HHA with SPC, needing min-modA to  Prevent LOB on 4 occasions    Time  12    Period  Weeks    Status  New      PT LONG TERM GOAL #3   Title  Patient will complete 5xSTS without UE support in 14.8sec in order to demonstrate age matched strength norms    Baseline  12/30/18 33sec with definite need for UE use    Time  12    Period  Weeks    Status  New      PT LONG TERM GOAL #4   Title  Patient will be able to ambulate 40ft with SPC without LOB/need for external support in order to demonstrate safety walking household distances/into story    Baseline  12/30/18 69ft with HHA with SPC, needing min-modA to  Prevent LOB on 4 occasions    Time  12    Period  Weeks    Status  New            Plan - 01/27/19 1246    Clinical Impression Statement  PT continued progression for core/hip strengthening and dynamic balance with good success. Patient very fearful of falling, requiring modA for balance activities for safety, and to improve confedience, with decent success. Patient continues to need assistance to walk to car following session d/t fatigue. PT will continue progression as able.    Personal Factors and Comorbidities  Age;Fitness;Comorbidity 1;Comorbidity 2;Comorbidity 3+;Past/Current Experience;Sex    Comorbidities  HTN, HLD, GERD, osteoporosis    Examination-Activity Limitations  Bathing;Sit;Dressing;Transfers;Bed Mobility;Bend;Lift;Carry;Reach Overhead;Stand;Stairs    Examination-Participation Restrictions  Church;Laundry;Cleaning;Community Activity;Meal Prep    Stability/Clinical Decision Making  Evolving/Moderate complexity    Clinical Decision Making  Moderate    Rehab Potential  Good    PT Frequency  2x / week    PT Duration  8 weeks    PT Treatment/Interventions  Moist Heat;Traction;Gait  training;Stair training;Balance training;Dry needling;Joint Manipulations;Spinal Manipulations;Passive range of motion;Manual techniques;Patient/family education;Therapeutic exercise;Electrical Stimulation;ADLs/Self Care Home Management;Therapeutic activities;Functional mobility training;Energy conservation;Neuromuscular re-education    PT Next Visit Plan  Balance training, LE/core strengthening    PT Home Exercise Plan  STS from chair, seated hip abd, seated marching    Consulted and Agree with Plan of Care  Patient       Patient will benefit from skilled therapeutic intervention in order to improve the following deficits and impairments:  Abnormal gait, Decreased balance, Decreased endurance, Decreased mobility, Difficulty walking, Hypomobility, Cardiopulmonary status limiting activity, Decreased range of motion, Improper body mechanics, Decreased activity tolerance, Decreased coordination, Decreased safety awareness, Decreased strength, Increased fascial restricitons, Impaired flexibility, Postural dysfunction, Pain  Visit Diagnosis: Chronic midline low back pain without sciatica  Difficulty in walking, not elsewhere classified  Repeated falls     Problem List Patient Active Problem List   Diagnosis Date Noted  . Morbid obesity ([place]) 04/08/2018  . Age-related osteoporosis without current pathological fracture 09/26/2017  . Pain in both hands 09/23/2017  . Hyperparathyroidism ([place]) 08/06/2017  . Chronic kidney disease, stage III (moderate) 08/06/2017  . HLD (hyperlipidemia) 07/25/2017  . Osteoporotic compression fracture of spine ([place]) 03/22/2017  . Assistance needed with transportation 03/21/2017  . Iron deficiency anemia 12/19/2016  .  Aortic atherosclerosis ([place]) 11/23/2016  . Anterolisthesis 11/23/2016  . Chronic pain syndrome 09/13/2016  . Vitamin D deficiency 08/19/2016  . Anemia, unspecified 08/19/2016  . Primary open angle glaucoma (POAG) of both eyes, severe stage  08/17/2016  . Hypertensive retinopathy 08/17/2016  . Long term current use of opiate analgesic 07/19/2016  . Dyslipidemia associated with type 2 diabetes mellitus ([place]) 04/16/2016  . Chronic midline low back pain without sciatica 04/16/2016  . Chronic pain of right knee 04/16/2016  . Vaginal dryness 02/22/2015  . Calculus of kidney 11/10/2014  . Osteoarthritis, multiple sites 09/09/2014  . Type 2 diabetes mellitus with microalbuminuria ([place]) 09/09/2014  . Glaucoma 09/09/2014  . Benign hypertension with CKD (chronic kidney disease) stage III ([place]) 09/09/2014  . Chronic pain 09/09/2014  . Tinea corporis 09/09/2014  . Umbilical hernia [id number]  . Lump or mass in breast    Shelton Silvas PT, DPT Shelton Silvas 01/27/2019, 1:28 PM  [facility] [address], [place], [address] Phone: [phone]   Fax:  [phone]  Name: Taylor [name] MRN: [id number] Date of Birth: [birth year]

## 2019-01-29 ENCOUNTER — Other Ambulatory Visit: Payer: Self-pay

## 2019-01-29 ENCOUNTER — Encounter: Payer: Self-pay | Admitting: Physical Therapy

## 2019-01-29 ENCOUNTER — Ambulatory Visit: Payer: Medicare Other | Admitting: Physical Therapy

## 2019-01-29 DIAGNOSIS — G8929 Other chronic pain: Secondary | ICD-10-CM

## 2019-01-29 DIAGNOSIS — R262 Difficulty in walking, not elsewhere classified: Secondary | ICD-10-CM

## 2019-01-29 DIAGNOSIS — R296 Repeated falls: Secondary | ICD-10-CM

## 2019-01-29 DIAGNOSIS — M545 Low back pain, unspecified: Secondary | ICD-10-CM

## 2019-01-29 NOTE — Therapy (Signed)
[facility] [address], [place], [address] Phone: [phone]   Fax:  [phone]  Physical Therapy Treatment  Patient Details  Name: Taylor [name] MRN: [id number] Date of Birth: April 05, 1932 No data recorded  Encounter Date: 01/29/2019  PT End of Session - 01/29/19 1549    Visit Number  6    Number of Visits  17    Date for PT Re-Evaluation  02/24/19    PT Start Time  0215    PT Stop Time  0300    PT Time Calculation (min)  45 min    Activity Tolerance  Patient tolerated treatment well    Behavior During Therapy  [facility] for tasks assessed/performed       Past Medical History:  Diagnosis Date  . Abnormal mammogram, unspecified 2013  . Breast screening, unspecified 2013  . Diabetes mellitus without complication ([place]) [id number]   non insulin dependent  . GERD (gastroesophageal reflux disease)   . Glaucoma 2003  . Gout   . Hyperlipidemia 2008  . Hypertension 1980's  . Lump or mass in breast 01/03/2012   left breast  . Obesity, unspecified 2013  . Osteoporosis   . Shingles 2013  . Special screening for malignant neoplasms, colon 2013    Past Surgical History:  Procedure Laterality Date  . ABDOMINAL HYSTERECTOMY  1958   menorrhagia  . BACK SURGERY    . BREAST BIOPSY Left 01/25/2012  . CATARACT EXTRACTION, BILATERAL Bilateral    1st 06/07/15 and the 2nd 06/21/15  . CHOLECYSTECTOMY    . COLONOSCOPY  2003   [facility]  . CORONARY ARTERY BYPASS GRAFT Left 01/03/2012,01/24/2012   Left FNA, Left breast [facility] bx  . KNEE SURGERY  2009,2011,2012   twice on right and once on left  . KNEE SURGERY    . LIPOMA EXCISION  1998  . [place]  2004  . TONSILLECTOMY     age of 50    There were no vitals filed for this visit.  Subjective Assessment - 01/29/19 1417    Subjective  No pain or falls since last appt. Reports she was a little sore following last session, resolved. UEs feel better today. Compliance  with HEP.    Pertinent History  Patinet is a 85 year old female presenting to clinic for LBP. Patient has LBP that bothers her when she stands up for 10-64mins for ADLS. Back pain does not radiate and feels achy. She can only sit without back support for 65mins without pain. Worst pain in the past week 9/10 best: 5/10. Reports she has had about 3 falls in the past year, and is usually falling backwards. Reports she is very unsteady with stepping onto varied surfaces. Ambulates with a hurry cane in her LUE and uses her RUE to furiture walk, and has a RW that she will use when she has someone to put it in her car with her when she goes out. When she gets to a store, she uses cart to help her walk. She lives alone and completes her own cooking, cleaning, but has trouble standing for the time it takes to cook/do dishing. Patient reports she gives herself bird baths in her bathroom because she cannot step over her tub to get into her tub/shower that has a seat in it. She drives and completes her own errands. When her church was open she enjoyed attending, enjoys playing games on her Ipad, and has friends in the  community that come and check on her daily, as well as being apart of the "Are You Okay" program with the [place] police (they call daily and if you do not answer they will send the police there to check on you). No steps to enter 1 story home, has a ramp. Denies sensation deficits.  Pt denies N/V, B&B changes, unexplained weight fluctuation, saddle paresthesia, fever, night sweats, or unrelenting night pain at this time.    Limitations  Sitting;Lifting;Standing;Walking    How long can you sit comfortably?  without support 10-7mins    How long can you stand comfortably?  10-57mins    How long can you walk comfortably?  with cane 73mins    Patient Stated Goals  Walk better    Pain Onset  More than a month ago         Ther-Ex - Nustep seat setting 7 BUE 10L42mins; L3 38minsfor LE muscle activation  with AAROM spine rotation with no increased pain; SPM over 60  Neuromuscular Re-Ed - SLS with balance stone taps with PT calling color for tap out at random. First round with tap to floor in between. Second round without. Education between rounds on forward weight shifitng as patient has heavy post lean with instance of almost LOB backward with LE elevation. PT is able to correct this well during second round. Education on ant wt shift with forward leg movement to prevent post LOB and in the event of LOB and fall increased safety with RW and hurry-cane. Ideally patient would have increased core control to be able to maintain neutral trunk with LE lift, but at this time this is not the case - Ambulation over 2 6in hurdles with patient stopping, taking increased time at each. Multiple attempts with encouragement needed and TC at pelvic for weight shifting and minA at first and encouragement with verbal cuing and CGA at second - STS transfer with hurry-cane x3 trials with demo and cuing for proper technique with rocking and ant weight shift; able to demonstrate good carry over, with 2x trials with rollator as well - ambulation outside and down curb with RW with increased stability and safety, but difficulty with negotiating RW into car                       PT Education - 01/29/19 1548    Education Details  therex form    Person(s) Educated  Patient    Methods  Explanation;Demonstration;Verbal cues    Comprehension  Verbalized understanding;Returned demonstration;Verbal cues required       PT Short Term Goals - 12/31/18 0840      PT SHORT TERM GOAL #1   Title  Pt will be independent with HEP in order to improve strength and decrease back pain in order to improve pain-free function at home and increase QOL    Baseline  12/30/18 HEP given    Time  4    Period  Weeks    Status  New      PT SHORT TERM GOAL #2   Title  Pt will improve BERG by at least 3 points in order to  demonstrate clinically significant improvement in balance.    Baseline  12/30/18 27/56    Time  6    Period  Weeks    Status  New      PT SHORT TERM GOAL #3   Title  Pt will increase 10MWT by at least 0.13 m/s in order to  demonstrate clinically significant improvement in community ambulation.    Baseline  12/30/18 0.18 m/s    Time  6      PT SHORT TERM GOAL #4   Title  Pt will decrease 5TSTS by at least 3 seconds in order to demonstrate clinically significant improvement in LE strength    Baseline  12/30/18 33sec    Time  6    Period  Weeks    Status  New        PT Long Term Goals - 12/31/18 0853      PT LONG TERM GOAL #1   Title  Patient will obtain a score of 46/56 on the BERG to demonstrate decreased fall risk    Baseline  12/30/18 27/56    Time  12    Period  Weeks    Status  New      PT LONG TERM GOAL #2   Title  Patient will demonstrate walk speed of 0.8-1.70m/s on 10MWT in order to demonstrate household ambulator walk speed with safety    Baseline  12/30/18 0.22m/s with HHA with SPC, needing min-modA to  Prevent LOB on 4 occasions    Time  12    Period  Weeks    Status  New      PT LONG TERM GOAL #3   Title  Patient will complete 5xSTS without UE support in 14.8sec in order to demonstrate age matched strength norms    Baseline  12/30/18 33sec with definite need for UE use    Time  12    Period  Weeks    Status  New      PT LONG TERM GOAL #4   Title  Patient will be able to ambulate 67ft with SPC without LOB/need for external support in order to demonstrate safety walking household distances/into story    Baseline  12/30/18 15ft with HHA with SPC, needing min-modA to  Prevent LOB on 4 occasions    Time  12    Period  Weeks    Status  New            Plan - 01/29/19 1554    Clinical Impression Statement  PT continued dynamic balance activities with carry over into functional activities. Patient with difficulty with weight shifting and techniques for  obstacle negotiation and functional transfers. Patient with increased safety with RW ambulating outside and descending curb, but with diffiuclty with RW negotiation into car. PT will continue progression as able.    Personal Factors and Comorbidities  Age;Fitness;Comorbidity 1;Comorbidity 2;Comorbidity 3+;Past/Current Experience;Sex    Comorbidities  HTN, HLD, GERD, osteoporosis    Examination-Activity Limitations  Bathing;Sit;Dressing;Transfers;Bed Mobility;Bend;Lift;Carry;Reach Overhead;Stand;Stairs    Examination-Participation Restrictions  Church;Laundry;Cleaning;Community Activity;Meal Prep    Stability/Clinical Decision Making  Evolving/Moderate complexity    Clinical Decision Making  Moderate    Rehab Potential  Good    PT Frequency  2x / week    PT Duration  8 weeks    PT Treatment/Interventions  Moist Heat;Traction;Gait training;Stair training;Balance training;Dry needling;Joint Manipulations;Spinal Manipulations;Passive range of motion;Manual techniques;Patient/family education;Therapeutic exercise;Electrical Stimulation;ADLs/Self Care Home Management;Therapeutic activities;Functional mobility training;Energy conservation;Neuromuscular re-education    PT Next Visit Plan  Balance training, LE/core strengthening    PT Home Exercise Plan  STS from chair, seated hip abd, seated marching    Consulted and Agree with Plan of Care  Patient       Patient will benefit from skilled therapeutic intervention in order to improve the following deficits and impairments:  Abnormal gait, Decreased balance, Decreased endurance, Decreased mobility, Difficulty walking, Hypomobility, Cardiopulmonary status limiting activity, Decreased range of motion, Improper body mechanics, Decreased activity tolerance, Decreased coordination, Decreased safety awareness, Decreased strength, Increased fascial restricitons, Impaired flexibility, Postural dysfunction, Pain  Visit Diagnosis: Chronic midline low back pain  without sciatica  Difficulty in walking, not elsewhere classified  Repeated falls     Problem List Patient Active Problem List   Diagnosis Date Noted  . Morbid obesity ([place]) 04/08/2018  . Age-related osteoporosis without current pathological fracture 09/26/2017  . Pain in both hands 09/23/2017  . Hyperparathyroidism ([place]) 08/06/2017  . Chronic kidney disease, stage III (moderate) 08/06/2017  . HLD (hyperlipidemia) 07/25/2017  . Osteoporotic compression fracture of spine ([place]) 03/22/2017  . Assistance needed with transportation 03/21/2017  . Iron deficiency anemia 12/19/2016  . Aortic atherosclerosis ([place]) 11/23/2016  . Anterolisthesis 11/23/2016  . Chronic pain syndrome 09/13/2016  . Vitamin D deficiency 08/19/2016  . Anemia, unspecified 08/19/2016  . Primary open angle glaucoma (POAG) of both eyes, severe stage 08/17/2016  . Hypertensive retinopathy 08/17/2016  . Long term current use of opiate analgesic 07/19/2016  . Dyslipidemia associated with type 2 diabetes mellitus ([place]) 04/16/2016  . Chronic midline low back pain without sciatica 04/16/2016  . Chronic pain of right knee 04/16/2016  . Vaginal dryness 02/22/2015  . Calculus of kidney 11/10/2014  . Osteoarthritis, multiple sites 09/09/2014  . Type 2 diabetes mellitus with microalbuminuria ([place]) 09/09/2014  . Glaucoma 09/09/2014  . Benign hypertension with CKD (chronic kidney disease) stage III ([place]) 09/09/2014  . Chronic pain 09/09/2014  . Tinea corporis 09/09/2014  . Umbilical hernia [id number]  . Lump or mass in breast    Shelton Silvas PT, DPT Shelton Silvas 01/29/2019, 4:22 PM  [facility] [address], [place], [address] Phone: [phone]   Fax:  [phone]  Name: Taylor [name] MRN: [id number] Date of Birth: 1932/10/18

## 2019-02-01 ENCOUNTER — Other Ambulatory Visit: Payer: Self-pay | Admitting: Family Medicine

## 2019-02-01 DIAGNOSIS — R809 Proteinuria, unspecified: Secondary | ICD-10-CM

## 2019-02-01 DIAGNOSIS — E1169 Type 2 diabetes mellitus with other specified complication: Secondary | ICD-10-CM

## 2019-02-01 DIAGNOSIS — E1322 Other specified diabetes mellitus with diabetic chronic kidney disease: Secondary | ICD-10-CM

## 2019-02-01 DIAGNOSIS — N183 Chronic kidney disease, stage 3 unspecified: Secondary | ICD-10-CM

## 2019-02-01 DIAGNOSIS — E785 Hyperlipidemia, unspecified: Secondary | ICD-10-CM

## 2019-02-01 DIAGNOSIS — E1129 Type 2 diabetes mellitus with other diabetic kidney complication: Secondary | ICD-10-CM

## 2019-02-02 ENCOUNTER — Other Ambulatory Visit: Payer: Self-pay | Admitting: Family Medicine

## 2019-02-02 DIAGNOSIS — K219 Gastro-esophageal reflux disease without esophagitis: Secondary | ICD-10-CM

## 2019-02-03 ENCOUNTER — Ambulatory Visit: Payer: Medicare Other | Admitting: Physical Therapy

## 2019-02-05 ENCOUNTER — Ambulatory Visit: Payer: Medicare Other | Admitting: Physical Therapy

## 2019-02-06 ENCOUNTER — Telehealth: Payer: Self-pay

## 2019-02-06 NOTE — Telephone Encounter (Signed)
Copied from [place] [id number]. Topic: General - Other >> Feb 06, 2019 12:48 PM Greggory Keen D wrote: Reason for CRM: pt called saying she has been feeling fatigued lately.  No pain.  She wanted to speak to the nurse.  CB#  [id number] >> Feb 06, 2019 12:54 PM Orvis Brill B wrote: Bonne Dolores calling to make appt no answer so sending to you per the request to speak with a nurse  She has been feeling fatigued since Saturday. She had to call her neighbor because she felt like she was going to pass out. She sounds like she has some sob, but she doesn't think that she is. She denies pain, chest pain. She does think she has slight dizziness but she is unsure. She feels pain in her head when she lays down on a pillow.

## 2019-02-09 ENCOUNTER — Ambulatory Visit: Payer: Medicare Other | Admitting: Physical Therapy

## 2019-02-09 ENCOUNTER — Encounter: Payer: Self-pay | Admitting: Physical Therapy

## 2019-02-09 ENCOUNTER — Other Ambulatory Visit: Payer: Self-pay

## 2019-02-09 DIAGNOSIS — M545 Low back pain, unspecified: Secondary | ICD-10-CM

## 2019-02-09 DIAGNOSIS — G8929 Other chronic pain: Secondary | ICD-10-CM

## 2019-02-09 DIAGNOSIS — R296 Repeated falls: Secondary | ICD-10-CM

## 2019-02-09 DIAGNOSIS — R262 Difficulty in walking, not elsewhere classified: Secondary | ICD-10-CM

## 2019-02-09 NOTE — Therapy (Signed)
[facility] [address] [place], [address] Phone: [phone]   Fax:  [phone]  Physical Therapy Treatment  Patient Details  Name: Taylor [name] MRN: [id number] Date of Birth: 01-13-1933 No data recorded  Encounter Date: 02/09/2019  PT End of Session - 02/09/19 1453    Visit Number  7    Number of Visits  17    Date for PT Re-Evaluation  02/24/19    PT Start Time  0230    PT Stop Time  0315    PT Time Calculation (min)  45 min    Activity Tolerance  Patient tolerated treatment well    Behavior During Therapy  [facility] for tasks assessed/performed       Past Medical History:  Diagnosis Date  . Abnormal mammogram, unspecified 2013  . Breast screening, unspecified 2013  . Diabetes mellitus without complication ([place]) [id number]   non insulin dependent  . GERD (gastroesophageal reflux disease)   . Glaucoma 2003  . Gout   . Hyperlipidemia 2008  . Hypertension 1980's  . Lump or mass in breast 01/03/2012   left breast  . Obesity, unspecified 2013  . Osteoporosis   . Shingles 2013  . Special screening for malignant neoplasms, colon 2013    Past Surgical History:  Procedure Laterality Date  . ABDOMINAL HYSTERECTOMY  1958   menorrhagia  . BACK SURGERY    . BREAST BIOPSY Left 01/25/2012  . CATARACT EXTRACTION, BILATERAL Bilateral    1st 06/07/15 and the 2nd 06/21/15  . CHOLECYSTECTOMY    . COLONOSCOPY  2003   [facility]  . CORONARY ARTERY BYPASS GRAFT Left 01/03/2012,01/24/2012   Left FNA, Left breast [facility] bx  . KNEE SURGERY  2009,2011,2012   twice on right and once on left  . KNEE SURGERY    . LIPOMA EXCISION  1998  . [place]  2004  . TONSILLECTOMY     age of 43    There were no vitals filed for this visit.  Subjective Assessment - 02/09/19 1447    Subjective  Patient reports no falls or pain. Reports no dizziness spells since last week. She is feeling well today    Pertinent History  Patinet  is a 85 year old female presenting to clinic for LBP. Patient has LBP that bothers her when she stands up for 10-33mins for ADLS. Back pain does not radiate and feels achy. She can only sit without back support for 70mins without pain. Worst pain in the past week 9/10 best: 5/10. Reports she has had about 3 falls in the past year, and is usually falling backwards. Reports she is very unsteady with stepping onto varied surfaces. Ambulates with a hurry cane in her LUE and uses her RUE to furiture walk, and has a RW that she will use when she has someone to put it in her car with her when she goes out. When she gets to a store, she uses cart to help her walk. She lives alone and completes her own cooking, cleaning, but has trouble standing for the time it takes to cook/do dishing. Patient reports she gives herself bird baths in her bathroom because she cannot step over her tub to get into her tub/shower that has a seat in it. She drives and completes her own errands. When her church was open she enjoyed attending, enjoys playing games on her Ipad, and has friends in the community that come and check on  her daily, as well as being apart of the "Are You Okay" program with the [place] police (they call daily and if you do not answer they will send the police there to check on you). No steps to enter 1 story home, has a ramp. Denies sensation deficits.  Pt denies N/V, B&B changes, unexplained weight fluctuation, saddle paresthesia, fever, night sweats, or unrelenting night pain at this time.    Limitations  Sitting;Lifting;Standing;Walking    How long can you sit comfortably?  without support 10-35mins    How long can you stand comfortably?  10-20mins    How long can you walk comfortably?  with cane 61mins    Patient Stated Goals  Walk better    Pain Onset  More than a month ago       Ther-Ex - Nustep seat setting 7 BUE 10L70mins; L3 41minsfor LE muscle activation with AAROM spine rotation with no increased  pain; SPM over 60 - Alt step up onto 6in step with BUE support 2x 6 (3 each LE leading) cuing for "push up" as opposed to pulling on handrails with good carry over, guarding needed for safety, increased time needed to complete - STS from elevated mat table 2x 5 3sec (5sec for last 2 reps) hold in standing without UE support with good balance, cuing for full stand with good carry over   Gait Training:  Gait training over 113ft with SPC inside gym and outside to sidewalk with slight decline. Cuing to prevent RUE reaching/touching her environment to challenge balance and increase safety in guarded environment relying on [facility] with good carry over, RUE HHA needed to clear small raise over exit door and for step down from curb in parking lot. Cuing for foot clearance with decent carry over. Very slow gait with multiple standing stops to regain balance                         PT Education - 02/09/19 1450    Education Details  therex form    Person(s) Educated  Patient    Methods  Explanation;Demonstration;Verbal cues    Comprehension  Verbalized understanding;Returned demonstration;Verbal cues required       PT Short Term Goals - 12/31/18 0840      PT SHORT TERM GOAL #1   Title  Pt will be independent with HEP in order to improve strength and decrease back pain in order to improve pain-free function at home and increase QOL    Baseline  12/30/18 HEP given    Time  4    Period  Weeks    Status  New      PT SHORT TERM GOAL #2   Title  Pt will improve BERG by at least 3 points in order to demonstrate clinically significant improvement in balance.    Baseline  12/30/18 27/56    Time  6    Period  Weeks    Status  New      PT SHORT TERM GOAL #3   Title  Pt will increase 10MWT by at least 0.13 m/s in order to demonstrate clinically significant improvement in community ambulation.    Baseline  12/30/18 0.18 m/s    Time  6      PT SHORT TERM GOAL #4   Title  Pt will  decrease 5TSTS by at least 3 seconds in order to demonstrate clinically significant improvement in LE strength    Baseline  12/30/18 33sec  Time  6    Period  Weeks    Status  New        PT Long Term Goals - 12/31/18 0853      PT LONG TERM GOAL #1   Title  Patient will obtain a score of 46/56 on the BERG to demonstrate decreased fall risk    Baseline  12/30/18 27/56    Time  12    Period  Weeks    Status  New      PT LONG TERM GOAL #2   Title  Patient will demonstrate walk speed of 0.8-1.23m/s on 10MWT in order to demonstrate household ambulator walk speed with safety    Baseline  12/30/18 0.41m/s with HHA with SPC, needing min-modA to  Prevent LOB on 4 occasions    Time  12    Period  Weeks    Status  New      PT LONG TERM GOAL #3   Title  Patient will complete 5xSTS without UE support in 14.8sec in order to demonstrate age matched strength norms    Baseline  12/30/18 33sec with definite need for UE use    Time  12    Period  Weeks    Status  New      PT LONG TERM GOAL #4   Title  Patient will be able to ambulate 2ft with SPC without LOB/need for external support in order to demonstrate safety walking household distances/into story    Baseline  12/30/18 41ft with HHA with SPC, needing min-modA to  Prevent LOB on 4 occasions    Time  12    Period  Weeks    Status  New            Plan - 02/09/19 1706    Clinical Impression Statement  PT continued therex and gait progression for increased balance, and strength to decrease patient fall risk. Patient is able to complete therex progression with proper technique following cuing and some increased rest needed to prevent fatigue LOB. Patient is improving in steadiness with gait, but with severe decreased gait speed. PT will continue progression as able.    Personal Factors and Comorbidities  Age;Fitness;Comorbidity 1;Comorbidity 2;Comorbidity 3+;Past/Current Experience;Sex    Comorbidities  HTN, HLD, GERD, osteoporosis     Examination-Activity Limitations  Bathing;Sit;Dressing;Transfers;Bed Mobility;Bend;Lift;Carry;Reach Overhead;Stand;Stairs    Examination-Participation Restrictions  Church;Laundry;Cleaning;Community Activity;Meal Prep    Stability/Clinical Decision Making  Evolving/Moderate complexity    Clinical Decision Making  Moderate    Rehab Potential  Good    PT Frequency  2x / week    PT Duration  8 weeks    PT Treatment/Interventions  Moist Heat;Traction;Gait training;Stair training;Balance training;Dry needling;Joint Manipulations;Spinal Manipulations;Passive range of motion;Manual techniques;Patient/family education;Therapeutic exercise;Electrical Stimulation;ADLs/Self Care Home Management;Therapeutic activities;Functional mobility training;Energy conservation;Neuromuscular re-education    PT Next Visit Plan  Balance training, LE/core strengthening    PT Home Exercise Plan  STS from chair, seated hip abd, seated marching    Consulted and Agree with Plan of Care  Patient       Patient will benefit from skilled therapeutic intervention in order to improve the following deficits and impairments:  Abnormal gait, Decreased balance, Decreased endurance, Decreased mobility, Difficulty walking, Hypomobility, Cardiopulmonary status limiting activity, Decreased range of motion, Improper body mechanics, Decreased activity tolerance, Decreased coordination, Decreased safety awareness, Decreased strength, Increased fascial restricitons, Impaired flexibility, Postural dysfunction, Pain  Visit Diagnosis: Chronic midline low back pain without sciatica  Difficulty in walking, not elsewhere classified  Repeated falls     Problem List Patient Active Problem List   Diagnosis Date Noted  . Morbid obesity ([place]) 04/08/2018  . Age-related osteoporosis without current pathological fracture 09/26/2017  . Pain in both hands 09/23/2017  . Hyperparathyroidism ([place]) 08/06/2017  . Chronic kidney disease, stage III  (moderate) 08/06/2017  . HLD (hyperlipidemia) 07/25/2017  . Osteoporotic compression fracture of spine ([place]) 03/22/2017  . Assistance needed with transportation 03/21/2017  . Iron deficiency anemia 12/19/2016  . Aortic atherosclerosis ([place]) 11/23/2016  . Anterolisthesis 11/23/2016  . Chronic pain syndrome 09/13/2016  . Vitamin D deficiency 08/19/2016  . Anemia, unspecified 08/19/2016  . Primary open angle glaucoma (POAG) of both eyes, severe stage 08/17/2016  . Hypertensive retinopathy 08/17/2016  . Long term current use of opiate analgesic 07/19/2016  . Dyslipidemia associated with type 2 diabetes mellitus ([place]) 04/16/2016  . Chronic midline low back pain without sciatica 04/16/2016  . Chronic pain of right knee 04/16/2016  . Vaginal dryness 02/22/2015  . Calculus of kidney 11/10/2014  . Osteoarthritis, multiple sites 09/09/2014  . Type 2 diabetes mellitus with microalbuminuria ([place]) 09/09/2014  . Glaucoma 09/09/2014  . Benign hypertension with CKD (chronic kidney disease) stage III ([place]) 09/09/2014  . Chronic pain 09/09/2014  . Tinea corporis 09/09/2014  . Umbilical hernia [id number]  . Lump or mass in breast    Shelton Silvas PT, DPT Shelton Silvas 02/09/2019, 5:12 PM  [facility] [address], [place], [address] Phone: [phone]   Fax:  [phone]  Name: Taylor [name] MRN: [id number] Date of Birth: Jul 09, 1932

## 2019-02-09 NOTE — Telephone Encounter (Signed)
Dr. Ancil Boozer wants her to have an appointment this week.

## 2019-02-09 NOTE — Telephone Encounter (Signed)
Dr Ancil Boozer said this could be covid and she should go to urgent care. She told me to route this message back to you

## 2019-02-10 NOTE — Telephone Encounter (Signed)
Pt called back and advised due to her symptoms, Dr Ancil Boozer recommends she go to [facility].[name] Pt verbalized understanding.  She states she goes to the one on [address] and she will go there this afternoon.

## 2019-02-10 NOTE — Telephone Encounter (Signed)
This patient has been triaged by me. I routed it to the front to schedule an appointment. I called her to tell her to go to [facility] no answer.

## 2019-02-11 ENCOUNTER — Ambulatory Visit: Payer: Medicare Other | Admitting: Physical Therapy

## 2019-02-16 ENCOUNTER — Encounter: Payer: Medicare Other | Admitting: Physical Therapy

## 2019-02-18 ENCOUNTER — Encounter: Payer: Self-pay | Admitting: Physical Therapy

## 2019-02-18 ENCOUNTER — Other Ambulatory Visit: Payer: Self-pay

## 2019-02-18 ENCOUNTER — Ambulatory Visit: Payer: Medicare Other | Admitting: Physical Therapy

## 2019-02-18 DIAGNOSIS — R296 Repeated falls: Secondary | ICD-10-CM

## 2019-02-18 DIAGNOSIS — M545 Low back pain: Secondary | ICD-10-CM | POA: Diagnosis not present

## 2019-02-18 DIAGNOSIS — G8929 Other chronic pain: Secondary | ICD-10-CM

## 2019-02-18 DIAGNOSIS — R262 Difficulty in walking, not elsewhere classified: Secondary | ICD-10-CM

## 2019-02-18 NOTE — Therapy (Signed)
[facility] [address], [place], [address] Phone: [phone]   Fax:  [phone]  Physical Therapy Treatment  Patient Details  Name: Taylor [name] MRN: [id number] Date of Birth: June 10, 1932 No data recorded  Encounter Date: 02/18/2019  PT End of Session - 02/18/19 1443    Visit Number  8    Number of Visits  17    Date for PT Re-Evaluation  02/24/19    PT Start Time  0238    PT Stop Time  0316    PT Time Calculation (min)  38 min    Activity Tolerance  Patient tolerated treatment well    Behavior During Therapy  [facility] for tasks assessed/performed       Past Medical History:  Diagnosis Date  . Abnormal mammogram, unspecified 2013  . Breast screening, unspecified 2013  . Diabetes mellitus without complication ([place]) [id number]   non insulin dependent  . GERD (gastroesophageal reflux disease)   . Glaucoma 2003  . Gout   . Hyperlipidemia 2008  . Hypertension 1980's  . Lump or mass in breast 01/03/2012   left breast  . Obesity, unspecified 2013  . Osteoporosis   . Shingles 2013  . Special screening for malignant neoplasms, colon 2013    Past Surgical History:  Procedure Laterality Date  . ABDOMINAL HYSTERECTOMY  1958   menorrhagia  . BACK SURGERY    . BREAST BIOPSY Left 01/25/2012  . CATARACT EXTRACTION, BILATERAL Bilateral    1st 06/07/15 and the 2nd 06/21/15  . CHOLECYSTECTOMY    . COLONOSCOPY  2003   [facility]  . CORONARY ARTERY BYPASS GRAFT Left 01/03/2012,01/24/2012   Left FNA, Left breast [facility] bx  . KNEE SURGERY  2009,2011,2012   twice on right and once on left  . KNEE SURGERY    . LIPOMA EXCISION  1998  . [place]  2004  . TONSILLECTOMY     age of 85    There were no vitals filed for this visit.  Subjective Assessment - 02/18/19 1441    Subjective  Patient reports no falls or pain. Compliance with HEP. Reports she has been feeling well over all.    Pertinent History  Patinet is a  85 year old female presenting to clinic for LBP. Patient has LBP that bothers her when she stands up for 10-80mins for ADLS. Back pain does not radiate and feels achy. She can only sit without back support for 29mins without pain. Worst pain in the past week 9/10 best: 5/10. Reports she has had about 3 falls in the past year, and is usually falling backwards. Reports she is very unsteady with stepping onto varied surfaces. Ambulates with a hurry cane in her LUE and uses her RUE to furiture walk, and has a RW that she will use when she has someone to put it in her car with her when she goes out. When she gets to a store, she uses cart to help her walk. She lives alone and completes her own cooking, cleaning, but has trouble standing for the time it takes to cook/do dishing. Patient reports she gives herself bird baths in her bathroom because she cannot step over her tub to get into her tub/shower that has a seat in it. She drives and completes her own errands. When her church was open she enjoyed attending, enjoys playing games on her Ipad, and has friends in the community that come and check on her  daily, as well as being apart of the "Are You Okay" program with the [place] police (they call daily and if you do not answer they will send the police there to check on you). No steps to enter 1 story home, has a ramp. Denies sensation deficits.  Pt denies N/V, B&B changes, unexplained weight fluctuation, saddle paresthesia, fever, night sweats, or unrelenting night pain at this time.    Limitations  Sitting;Lifting;Standing;Walking    How long can you sit comfortably?  without support 10-22mins    How long can you stand comfortably?  10-85mins    How long can you walk comfortably?  with cane 96mins    Patient Stated Goals  Walk better    Pain Onset  More than a month ago           Ther-Ex - Nustep seat setting 7 BUE 10L53minsfor LE muscle activation with AAROM spine rotation with no increased pain; SPM  over 60 - Alt step up onto 6in step with BUE support 2x 6 (2 each LE leading) cuing for "push up" as opposed to pulling on handrails with good carry over, guarding needed for safety, increased time needed to complete - Standing without support BUE flexion with yellow theraball 2x 8 with heavy posterior lean with overhead reach, able to correct this somewhat; decent hip strategy used with some tactile stabilization for safety  - STS from elevated mat table 2x 5 5sec hold in standing without UE support with good balance, cuing for full stand with good carry over - Standing weight shifting x10 with guarding for safety                       PT Education - 02/18/19 1442    Education Details  therex form    Person(s) Educated  Patient    Methods  Explanation;Demonstration;Tactile cues;Verbal cues    Comprehension  Verbalized understanding;Returned demonstration;Verbal cues required;Tactile cues required       PT Short Term Goals - 12/31/18 0840      PT SHORT TERM GOAL #1   Title  Pt will be independent with HEP in order to improve strength and decrease back pain in order to improve pain-free function at home and increase QOL    Baseline  12/30/18 HEP given    Time  4    Period  Weeks    Status  New      PT SHORT TERM GOAL #2   Title  Pt will improve BERG by at least 3 points in order to demonstrate clinically significant improvement in balance.    Baseline  12/30/18 27/56    Time  6    Period  Weeks    Status  New      PT SHORT TERM GOAL #3   Title  Pt will increase 10MWT by at least 0.13 m/s in order to demonstrate clinically significant improvement in community ambulation.    Baseline  12/30/18 0.18 m/s    Time  6      PT SHORT TERM GOAL #4   Title  Pt will decrease 5TSTS by at least 3 seconds in order to demonstrate clinically significant improvement in LE strength    Baseline  12/30/18 33sec    Time  6    Period  Weeks    Status  New        PT Long  Term Goals - 12/31/18 0853      PT LONG TERM GOAL #  1   Title  Patient will obtain a score of 46/56 on the BERG to demonstrate decreased fall risk    Baseline  12/30/18 27/56    Time  12    Period  Weeks    Status  New      PT LONG TERM GOAL #2   Title  Patient will demonstrate walk speed of 0.8-1.33m/s on 10MWT in order to demonstrate household ambulator walk speed with safety    Baseline  12/30/18 0.71m/s with HHA with SPC, needing min-modA to  Prevent LOB on 4 occasions    Time  12    Period  Weeks    Status  New      PT LONG TERM GOAL #3   Title  Patient will complete 5xSTS without UE support in 14.8sec in order to demonstrate age matched strength norms    Baseline  12/30/18 33sec with definite need for UE use    Time  12    Period  Weeks    Status  New      PT LONG TERM GOAL #4   Title  Patient will be able to ambulate 37ft with SPC without LOB/need for external support in order to demonstrate safety walking household distances/into story    Baseline  12/30/18 54ft with HHA with SPC, needing min-modA to  Prevent LOB on 4 occasions    Time  12    Period  Weeks    Status  New            Plan - 02/18/19 1451    Clinical Impression Statement  PT continued therex progression for LE and core strength and balance with good success. Patient is increasing confedience walking around clinic with less reliance on feeling with RUE while utilizing SPC in LUE, though she has slow and steady gait and transitional movements. PT will contrinue progression as able.    Personal Factors and Comorbidities  Age;Fitness;Comorbidity 1;Comorbidity 2;Comorbidity 3+;Past/Current Experience;Sex    Comorbidities  HTN, HLD, GERD, osteoporosis    Examination-Activity Limitations  Bathing;Sit;Dressing;Transfers;Bed Mobility;Bend;Lift;Carry;Reach Overhead;Stand;Stairs    Examination-Participation Restrictions  Church;Laundry;Cleaning;Community Activity;Meal Prep    Stability/Clinical Decision Making   Evolving/Moderate complexity    Clinical Decision Making  Moderate    Rehab Potential  Good    PT Frequency  2x / week    PT Duration  8 weeks    PT Treatment/Interventions  Moist Heat;Traction;Gait training;Stair training;Balance training;Dry needling;Joint Manipulations;Spinal Manipulations;Passive range of motion;Manual techniques;Patient/family education;Therapeutic exercise;Electrical Stimulation;ADLs/Self Care Home Management;Therapeutic activities;Functional mobility training;Energy conservation;Neuromuscular re-education    PT Next Visit Plan  Balance training, LE/core strengthening    PT Home Exercise Plan  STS from chair, seated hip abd, seated marching    Consulted and Agree with Plan of Care  Patient       Patient will benefit from skilled therapeutic intervention in order to improve the following deficits and impairments:  Abnormal gait, Decreased balance, Decreased endurance, Decreased mobility, Difficulty walking, Hypomobility, Cardiopulmonary status limiting activity, Decreased range of motion, Improper body mechanics, Decreased activity tolerance, Decreased coordination, Decreased safety awareness, Decreased strength, Increased fascial restricitons, Impaired flexibility, Postural dysfunction, Pain  Visit Diagnosis: Chronic midline low back pain without sciatica  Difficulty in walking, not elsewhere classified  Repeated falls     Problem List Patient Active Problem List   Diagnosis Date Noted  . Morbid obesity ([place]) 04/08/2018  . Age-related osteoporosis without current pathological fracture 09/26/2017  . Pain in both hands 09/23/2017  . Hyperparathyroidism ([place]) 08/06/2017  .  Chronic kidney disease, stage III (moderate) 08/06/2017  . HLD (hyperlipidemia) 07/25/2017  . Osteoporotic compression fracture of spine ([place]) 03/22/2017  . Assistance needed with transportation 03/21/2017  . Iron deficiency anemia 12/19/2016  . Aortic atherosclerosis ([place]) 11/23/2016  .  Anterolisthesis 11/23/2016  . Chronic pain syndrome 09/13/2016  . Vitamin D deficiency 08/19/2016  . Anemia, unspecified 08/19/2016  . Primary open angle glaucoma (POAG) of both eyes, severe stage 08/17/2016  . Hypertensive retinopathy 08/17/2016  . Long term current use of opiate analgesic 07/19/2016  . Dyslipidemia associated with type 2 diabetes mellitus ([place]) 04/16/2016  . Chronic midline low back pain without sciatica 04/16/2016  . Chronic pain of right knee 04/16/2016  . Vaginal dryness 02/22/2015  . Calculus of kidney 11/10/2014  . Osteoarthritis, multiple sites 09/09/2014  . Type 2 diabetes mellitus with microalbuminuria ([place]) 09/09/2014  . Glaucoma 09/09/2014  . Benign hypertension with CKD (chronic kidney disease) stage III ([place]) 09/09/2014  . Chronic pain 09/09/2014  . Tinea corporis 09/09/2014  . Umbilical hernia [id number]  . Lump or mass in breast    Shelton Silvas PT, DPT Shelton Silvas 02/18/2019, 3:17 PM  [facility] [address] [place], [address] Phone: [phone]   Fax:  [phone]  Name: Taylor [name] MRN: [id number] Date of Birth: [birth year]

## 2019-02-24 ENCOUNTER — Encounter: Payer: Self-pay | Admitting: Physical Therapy

## 2019-02-24 ENCOUNTER — Other Ambulatory Visit: Payer: Self-pay

## 2019-02-24 ENCOUNTER — Ambulatory Visit: Payer: Medicare Other | Attending: Family Medicine | Admitting: Physical Therapy

## 2019-02-24 DIAGNOSIS — M545 Low back pain: Secondary | ICD-10-CM | POA: Diagnosis not present

## 2019-02-24 DIAGNOSIS — R262 Difficulty in walking, not elsewhere classified: Secondary | ICD-10-CM | POA: Diagnosis not present

## 2019-02-24 DIAGNOSIS — G8929 Other chronic pain: Secondary | ICD-10-CM | POA: Diagnosis not present

## 2019-02-24 DIAGNOSIS — R296 Repeated falls: Secondary | ICD-10-CM

## 2019-02-24 NOTE — Therapy (Signed)
[facility] [address] [place], [address] Phone: [phone]   Fax:  [phone]  Physical Therapy Treatment  Patient Details  Name: Taylor [name] MRN: [id number] Date of Birth: Mar 23, 1932 No data recorded  Encounter Date: 02/24/2019  PT End of Session - 02/24/19 1123    Visit Number  9    Number of Visits  17    Date for PT Re-Evaluation  02/24/19    PT Start Time  1115    PT Stop Time  1155    PT Time Calculation (min)  40 min    Activity Tolerance  Patient tolerated treatment well    Behavior During Therapy  [facility] for tasks assessed/performed       Past Medical History:  Diagnosis Date  . Abnormal mammogram, unspecified 2013  . Breast screening, unspecified 2013  . Diabetes mellitus without complication ([place]) [id number]   non insulin dependent  . GERD (gastroesophageal reflux disease)   . Glaucoma 2003  . Gout   . Hyperlipidemia 2008  . Hypertension 1980's  . Lump or mass in breast 01/03/2012   left breast  . Obesity, unspecified 2013  . Osteoporosis   . Shingles 2013  . Special screening for malignant neoplasms, colon 2013    Past Surgical History:  Procedure Laterality Date  . ABDOMINAL HYSTERECTOMY  1958   menorrhagia  . BACK SURGERY    . BREAST BIOPSY Left 01/25/2012  . CATARACT EXTRACTION, BILATERAL Bilateral    1st 06/07/15 and the 2nd 06/21/15  . CHOLECYSTECTOMY    . COLONOSCOPY  2003   [facility]  . CORONARY ARTERY BYPASS GRAFT Left 01/03/2012,01/24/2012   Left FNA, Left breast [facility] bx  . KNEE SURGERY  2009,2011,2012   twice on right and once on left  . KNEE SURGERY    . LIPOMA EXCISION  1998  . [place]  2004  . TONSILLECTOMY     age of 12    There were no vitals filed for this visit.  Subjective Assessment - 02/24/19 1119    Subjective  Patient reports no falls since last visit. Compliance with HEP. Patient does report some L hip pain that she reports is sore, and says  she is very tired today.    Pertinent History  Patinet is a 86 year old female presenting to clinic for LBP. Patient has LBP that bothers her when she stands up for 10-63mins for ADLS. Back pain does not radiate and feels achy. She can only sit without back support for 54mins without pain. Worst pain in the past week 9/10 best: 5/10. Reports she has had about 3 falls in the past year, and is usually falling backwards. Reports she is very unsteady with stepping onto varied surfaces. Ambulates with a hurry cane in her LUE and uses her RUE to furiture walk, and has a RW that she will use when she has someone to put it in her car with her when she goes out. When she gets to a store, she uses cart to help her walk. She lives alone and completes her own cooking, cleaning, but has trouble standing for the time it takes to cook/do dishing. Patient reports she gives herself bird baths in her bathroom because she cannot step over her tub to get into her tub/shower that has a seat in it. She drives and completes her own errands. When her church was open she enjoyed attending, enjoys playing games on her Ipad,  and has friends in the community that come and check on her daily, as well as being apart of the "Are You Okay" program with the [place] police (they call daily and if you do not answer they will send the police there to check on you). No steps to enter 1 story home, has a ramp. Denies sensation deficits.  Pt denies N/V, B&B changes, unexplained weight fluctuation, saddle paresthesia, fever, night sweats, or unrelenting night pain at this time.    Limitations  Sitting;Lifting;Standing;Walking    How long can you sit comfortably?  without support 10-46mins    How long can you stand comfortably?  10-58mins    How long can you walk comfortably?  with cane 40mins    Patient Stated Goals  Walk better    Pain Onset  More than a month ago          Ther-Ex - Nustep seat setting 7 BUE 10L45minsfor LE muscle  activation with AAROM spine rotation with no increased pain; SPM over 65 - Step over and back over 1in hurdle x6 (each direction); side to side over 1in hurdle x6 (each direction), gaurding for safety, cuing for foot clearance (more difficult on RLE) and standing rest breaks between - Standing without support BUE flexion with yellow theraball 2x 8 with less posterior lean after 3 sets with CGA and occasional minA to prevent LOB, but better overall - Standing weight shifting x10 with guarding for safety   Gait Training  Over 142ft inside > outside Inside over 3 6in hurdles with cuing for forward weight shift with step over, preventing post LOB with good carry over), one turn, and cone weaving. SPC with occasional HHA and CGA for safety Outside down decline >5% grade to parking lot with curb step down, able to complete without HHA, SPC only, supervision for safety                     PT Education - 02/24/19 1122    Education Details  therex form; Psychiatric nurse) Educated  Patient    Methods  Explanation;Demonstration;Tactile cues;Verbal cues    Comprehension  Verbalized understanding;Returned demonstration;Verbal cues required;Tactile cues required       PT Short Term Goals - 12/31/18 0840      PT SHORT TERM GOAL #1   Title  Pt will be independent with HEP in order to improve strength and decrease back pain in order to improve pain-free function at home and increase QOL    Baseline  12/30/18 HEP given    Time  4    Period  Weeks    Status  New      PT SHORT TERM GOAL #2   Title  Pt will improve BERG by at least 3 points in order to demonstrate clinically significant improvement in balance.    Baseline  12/30/18 27/56    Time  6    Period  Weeks    Status  New      PT SHORT TERM GOAL #3   Title  Pt will increase 10MWT by at least 0.13 m/s in order to demonstrate clinically significant improvement in community ambulation.    Baseline  12/30/18 0.18 m/s    Time   6      PT SHORT TERM GOAL #4   Title  Pt will decrease 5TSTS by at least 3 seconds in order to demonstrate clinically significant improvement in LE strength    Baseline  12/30/18  33sec    Time  6    Period  Weeks    Status  New        PT Long Term Goals - 12/31/18 0853      PT LONG TERM GOAL #1   Title  Patient will obtain a score of 46/56 on the BERG to demonstrate decreased fall risk    Baseline  12/30/18 27/56    Time  12    Period  Weeks    Status  New      PT LONG TERM GOAL #2   Title  Patient will demonstrate walk speed of 0.8-1.103m/s on 10MWT in order to demonstrate household ambulator walk speed with safety    Baseline  12/30/18 0.77m/s with HHA with SPC, needing min-modA to  Prevent LOB on 4 occasions    Time  12    Period  Weeks    Status  New      PT LONG TERM GOAL #3   Title  Patient will complete 5xSTS without UE support in 14.8sec in order to demonstrate age matched strength norms    Baseline  12/30/18 33sec with definite need for UE use    Time  12    Period  Weeks    Status  New      PT LONG TERM GOAL #4   Title  Patient will be able to ambulate 40ft with SPC without LOB/need for external support in order to demonstrate safety walking household distances/into story    Baseline  12/30/18 25ft with HHA with SPC, needing min-modA to  Prevent LOB on 4 occasions    Time  12    Period  Weeks    Status  New            Plan - 02/24/19 1258    Clinical Impression Statement  PT continued therex progression for LE and core strength and balance with good success. Patient is improving with UE and LE motion without posterior LOB, with better use of hip strategy, though she does continue to require minA at times to prevent LOB. PT will continue progression as able.    Personal Factors and Comorbidities  Age;Fitness;Comorbidity 1;Comorbidity 2;Comorbidity 3+;Past/Current Experience;Sex    Comorbidities  HTN, HLD, GERD, osteoporosis    Examination-Activity  Limitations  Bathing;Sit;Dressing;Transfers;Bed Mobility;Bend;Lift;Carry;Reach Overhead;Stand;Stairs    Examination-Participation Restrictions  Church;Laundry;Cleaning;Community Activity;Meal Prep    Stability/Clinical Decision Making  Evolving/Moderate complexity    Clinical Decision Making  Moderate    Rehab Potential  Good    PT Frequency  2x / week    PT Duration  8 weeks    PT Treatment/Interventions  Moist Heat;Traction;Gait training;Stair training;Balance training;Dry needling;Joint Manipulations;Spinal Manipulations;Passive range of motion;Manual techniques;Patient/family education;Therapeutic exercise;Electrical Stimulation;ADLs/Self Care Home Management;Therapeutic activities;Functional mobility training;Energy conservation;Neuromuscular re-education    PT Next Visit Plan  Balance training, LE/core strengthening    PT Home Exercise Plan  STS from chair, seated hip abd, seated marching    Consulted and Agree with Plan of Care  Patient       Patient will benefit from skilled therapeutic intervention in order to improve the following deficits and impairments:  Abnormal gait, Decreased balance, Decreased endurance, Decreased mobility, Difficulty walking, Hypomobility, Cardiopulmonary status limiting activity, Decreased range of motion, Improper body mechanics, Decreased activity tolerance, Decreased coordination, Decreased safety awareness, Decreased strength, Increased fascial restricitons, Impaired flexibility, Postural dysfunction, Pain  Visit Diagnosis: Chronic midline low back pain without sciatica  Difficulty in walking, not elsewhere classified  Repeated falls  Problem List Patient Active Problem List   Diagnosis Date Noted  . Morbid obesity ([place]) 04/08/2018  . Age-related osteoporosis without current pathological fracture 09/26/2017  . Pain in both hands 09/23/2017  . Hyperparathyroidism ([place]) 08/06/2017  . Chronic kidney disease, stage III (moderate) 08/06/2017  .  HLD (hyperlipidemia) 07/25/2017  . Osteoporotic compression fracture of spine ([place]) 03/22/2017  . Assistance needed with transportation 03/21/2017  . Iron deficiency anemia 12/19/2016  . Aortic atherosclerosis ([place]) 11/23/2016  . Anterolisthesis 11/23/2016  . Chronic pain syndrome 09/13/2016  . Vitamin D deficiency 08/19/2016  . Anemia, unspecified 08/19/2016  . Primary open angle glaucoma (POAG) of both eyes, severe stage 08/17/2016  . Hypertensive retinopathy 08/17/2016  . Long term current use of opiate analgesic 07/19/2016  . Dyslipidemia associated with type 2 diabetes mellitus ([place]) 04/16/2016  . Chronic midline low back pain without sciatica 04/16/2016  . Chronic pain of right knee 04/16/2016  . Vaginal dryness 02/22/2015  . Calculus of kidney 11/10/2014  . Osteoarthritis, multiple sites 09/09/2014  . Type 2 diabetes mellitus with microalbuminuria ([place]) 09/09/2014  . Glaucoma 09/09/2014  . Benign hypertension with CKD (chronic kidney disease) stage III ([place]) 09/09/2014  . Chronic pain 09/09/2014  . Tinea corporis 09/09/2014  . Umbilical hernia [id number]  . Lump or mass in breast    Shelton Silvas PT, DPT Shelton Silvas 02/24/2019, 1:45 PM  [facility] [address], [place], [address] Phone: [phone]   Fax:  [phone]  Name: ROXANN MASSARO MRN: [id number] Date of Birth: January 04, 1933

## 2019-03-03 ENCOUNTER — Ambulatory Visit: Payer: Medicare Other | Admitting: Physical Therapy

## 2019-03-03 ENCOUNTER — Encounter: Payer: Self-pay | Admitting: Physical Therapy

## 2019-03-03 ENCOUNTER — Other Ambulatory Visit: Payer: Self-pay

## 2019-03-03 DIAGNOSIS — M545 Low back pain: Secondary | ICD-10-CM | POA: Diagnosis not present

## 2019-03-03 DIAGNOSIS — R262 Difficulty in walking, not elsewhere classified: Secondary | ICD-10-CM

## 2019-03-03 DIAGNOSIS — G8929 Other chronic pain: Secondary | ICD-10-CM | POA: Diagnosis not present

## 2019-03-03 DIAGNOSIS — R296 Repeated falls: Secondary | ICD-10-CM | POA: Diagnosis not present

## 2019-03-03 NOTE — Therapy (Signed)
[facility] [address], [place], [address] Phone: [phone]   Fax:  [phone]  Physical Therapy Treatment/ Reassessment Reporting period 12/30/18 - 03/03/19  Patient Details  Name: Taylor [name] MRN: [id number] Date of Birth: 1933-01-25 No data recorded  Encounter Date: 03/03/2019  PT End of Session - 03/03/19 1455    Visit Number  10    Number of Visits  33    Date for PT Re-Evaluation  04/21/19    PT Start Time  0230    PT Stop Time  0315    PT Time Calculation (min)  45 min    Activity Tolerance  Patient tolerated treatment well    Behavior During Therapy  [facility] for tasks assessed/performed       Past Medical History:  Diagnosis Date  . Abnormal mammogram, unspecified 2013  . Breast screening, unspecified 2013  . Diabetes mellitus without complication ([place]) [id number]   non insulin dependent  . GERD (gastroesophageal reflux disease)   . Glaucoma 2003  . Gout   . Hyperlipidemia 2008  . Hypertension 1980's  . Lump or mass in breast 01/03/2012   left breast  . Obesity, unspecified 2013  . Osteoporosis   . Shingles 2013  . Special screening for malignant neoplasms, colon 2013    Past Surgical History:  Procedure Laterality Date  . ABDOMINAL HYSTERECTOMY  1958   menorrhagia  . BACK SURGERY    . BREAST BIOPSY Left 01/25/2012  . CATARACT EXTRACTION, BILATERAL Bilateral    1st 06/07/15 and the 2nd 06/21/15  . CHOLECYSTECTOMY    . COLONOSCOPY  2003   [facility]  . CORONARY ARTERY BYPASS GRAFT Left 01/03/2012,01/24/2012   Left FNA, Left breast [facility] bx  . KNEE SURGERY  2009,2011,2012   twice on right and once on left  . KNEE SURGERY    . LIPOMA EXCISION  1998  . [place]  2004  . TONSILLECTOMY     age of 44    There were no vitals filed for this visit.  Subjective Assessment - 03/03/19 1438    Subjective  Patinet reports no pain "out of the ordinary" today. Reports no falls since last  visit. No L hip pain today.    Pertinent History  Patinet is a 86 year old female presenting to clinic for LBP. Patient has LBP that bothers her when she stands up for 10-34mins for ADLS. Back pain does not radiate and feels achy. She can only sit without back support for 58mins without pain. Worst pain in the past week 9/10 best: 5/10. Reports she has had about 3 falls in the past year, and is usually falling backwards. Reports she is very unsteady with stepping onto varied surfaces. Ambulates with a hurry cane in her LUE and uses her RUE to furiture walk, and has a RW that she will use when she has someone to put it in her car with her when she goes out. When she gets to a store, she uses cart to help her walk. She lives alone and completes her own cooking, cleaning, but has trouble standing for the time it takes to cook/do dishing. Patient reports she gives herself bird baths in her bathroom because she cannot step over her tub to get into her tub/shower that has a seat in it. She drives and completes her own errands. When her church was open she enjoyed attending, enjoys playing games on her Ipad, and has friends  in the community that come and check on her daily, as well as being apart of the "Are You Okay" program with the [place] police (they call daily and if you do not answer they will send the police there to check on you). No steps to enter 1 story home, has a ramp. Denies sensation deficits.  Pt denies N/V, B&B changes, unexplained weight fluctuation, saddle paresthesia, fever, night sweats, or unrelenting night pain at this time.    Limitations  Sitting;Lifting;Standing;Walking    How long can you sit comfortably?  without support 10-26mins    How long can you stand comfortably?  10-31mins    How long can you walk comfortably?  with cane 30mins    Patient Stated Goals  Walk better    Pain Onset  More than a month ago       Ther-Ex - Nustep seat setting 7 BUE 10L49minsfor LE muscle activation  with AAROM spine rotation with no increased pain; SPM over 70 - BERG balance test with patient improving in static balance positions, continued difficulty with dynamic balance but able to attempt all tests   Gait Training  PT trained pt with bilat canes over 97ft with CGA needed for safety with occasional minA to prevent LOB. Increased time needed, but once patient is able to carry over technique does have less lateral displacement. Patient is able to ambulate out of clinic, down slight hill and curb with HHA and SPC with increased gait speed than bilat canes and looks much steadier. PT continued to educate patient on RW safety and that this is the best option at this time, patient reports she does use this at home but loses her balance when attempting to put RW in and out of car                         PT Education - 03/03/19 1454    Education Details  therex form, gait training    Person(s) Educated  Patient    Methods  Explanation;Demonstration;Verbal cues;Tactile cues    Comprehension  Verbalized understanding;Returned demonstration;Verbal cues required;Tactile cues required       PT Short Term Goals - 03/03/19 1456      PT SHORT TERM GOAL #1   Title  Pt will be independent with HEP in order to improve strength and decrease back pain in order to improve pain-free function at home and increase QOL    Baseline  03/03/19 Completing HEP with 50% compliance    Time  4    Period  Weeks    Status  On-going      PT SHORT TERM GOAL #2   Title  Pt will improve BERG by at least 3 points in order to demonstrate clinically significant improvement in balance.    Baseline  03/03/19 31/56    Time  6    Period  Weeks    Status  Achieved      PT SHORT TERM GOAL #3   Title  Pt will increase 10MWT by at least 0.13 m/s in order to demonstrate clinically significant improvement in community ambulation.    Baseline  12/30/18 0.18 m/s    Time  6    Period  Weeks    Status   On-going      PT SHORT TERM GOAL #4   Title  Pt will decrease 5TSTS by at least 3 seconds in order to demonstrate clinically significant improvement in LE strength  Baseline  03/03/19 36sec    Time  6    Period  Weeks    Status  On-going        PT Long Term Goals - 03/03/19 1517      PT LONG TERM GOAL #1   Title  Patient will obtain a score of 46/56 on the BERG to demonstrate decreased fall risk    Baseline  03/02/54 31/56    Time  12    Period  Weeks    Status  On-going      PT LONG TERM GOAL #2   Title  Patient will demonstrate walk speed of 0.8-1.67m/s on 10MWT in order to demonstrate household ambulator walk speed with safety    Baseline  12/30/18 0.51m/s with HHA with SPC, needing min-modA to  Prevent LOB on 4 occasions    Time  12    Period  Weeks    Status  On-going      PT LONG TERM GOAL #3   Title  Patient will complete 5xSTS without UE support in 14.8sec in order to demonstrate age matched strength norms    Baseline  03/03/19 36sec    Time  12    Period  Weeks    Status  On-going      PT LONG TERM GOAL #4   Title  Patient will be able to ambulate 52ft with SPC without LOB/need for external support in order to demonstrate safety walking household distances/into story    Baseline  01/01/20 able to walk 3ft with HHA and SPC, 20ft without RUE touching consistently    Time  12    Period  Weeks    Status  On-going            Plan - 03/03/19 1554    Clinical Impression Statement  PT reassessed goals this session where patient is making steady progress toward goals, with demonstrated better static balance. Patient with continued deficts in dynamic balance needed to prevent LOB in gait, reaching, and transfers, and strength for foot clearance, transfers and squatting. PT attempted bilat cane gait training, which patient was not very fond of but did produce good effort in attempting.  PT will continue progression as able.    Personal Factors and Comorbidities   Age;Fitness;Comorbidity 1;Comorbidity 2;Comorbidity 3+;Past/Current Experience;Sex    Comorbidities  HTN, HLD, GERD, osteoporosis    Examination-Activity Limitations  Bathing;Sit;Dressing;Transfers;Bed Mobility;Bend;Lift;Carry;Reach Overhead;Stand;Stairs    Examination-Participation Restrictions  Church;Laundry;Cleaning;Community Activity;Meal Prep    Stability/Clinical Decision Making  Evolving/Moderate complexity    Clinical Decision Making  Moderate    Rehab Potential  Good    PT Frequency  2x / week    PT Duration  8 weeks    PT Treatment/Interventions  Moist Heat;Traction;Gait training;Stair training;Balance training;Dry needling;Joint Manipulations;Spinal Manipulations;Passive range of motion;Manual techniques;Patient/family education;Therapeutic exercise;Electrical Stimulation;ADLs/Self Care Home Management;Therapeutic activities;Functional mobility training;Energy conservation;Neuromuscular re-education    PT Next Visit Plan  Balance training, LE/core strengthening    PT Home Exercise Plan  STS from chair, seated hip abd, seated marching    Consulted and Agree with Plan of Care  Patient       Patient will benefit from skilled therapeutic intervention in order to improve the following deficits and impairments:  Abnormal gait, Decreased balance, Decreased endurance, Decreased mobility, Difficulty walking, Hypomobility, Cardiopulmonary status limiting activity, Decreased range of motion, Improper body mechanics, Decreased activity tolerance, Decreased coordination, Decreased safety awareness, Decreased strength, Increased fascial restricitons, Impaired flexibility, Postural dysfunction, Pain  Visit Diagnosis: Chronic  midline low back pain without sciatica  Difficulty in walking, not elsewhere classified  Repeated falls     Problem List Patient Active Problem List   Diagnosis Date Noted  . Morbid obesity ([place]) 04/08/2018  . Age-related osteoporosis without current pathological  fracture 09/26/2017  . Pain in both hands 09/23/2017  . Hyperparathyroidism ([place]) 08/06/2017  . Chronic kidney disease, stage III (moderate) 08/06/2017  . HLD (hyperlipidemia) 07/25/2017  . Osteoporotic compression fracture of spine ([place]) 03/22/2017  . Assistance needed with transportation 03/21/2017  . Iron deficiency anemia 12/19/2016  . Aortic atherosclerosis ([place]) 11/23/2016  . Anterolisthesis 11/23/2016  . Chronic pain syndrome 09/13/2016  . Vitamin D deficiency 08/19/2016  . Anemia, unspecified 08/19/2016  . Primary open angle glaucoma (POAG) of both eyes, severe stage 08/17/2016  . Hypertensive retinopathy 08/17/2016  . Long term current use of opiate analgesic 07/19/2016  . Dyslipidemia associated with type 2 diabetes mellitus ([place]) 04/16/2016  . Chronic midline low back pain without sciatica 04/16/2016  . Chronic pain of right knee 04/16/2016  . Vaginal dryness 02/22/2015  . Calculus of kidney 11/10/2014  . Osteoarthritis, multiple sites 09/09/2014  . Type 2 diabetes mellitus with microalbuminuria ([place]) 09/09/2014  . Glaucoma 09/09/2014  . Benign hypertension with CKD (chronic kidney disease) stage III ([place]) 09/09/2014  . Chronic pain 09/09/2014  . Tinea corporis 09/09/2014  . Umbilical hernia [id number]  . Lump or mass in breast    Shelton Silvas PT, DPT Shelton Silvas 03/03/2019, 5:18 PM  [facility] [address], [place], [address] Phone: [phone]   Fax:  [phone]  Name: Taylor [name] MRN: [id number] Date of Birth: 03-02-1932

## 2019-03-05 ENCOUNTER — Other Ambulatory Visit: Payer: Self-pay | Admitting: Family Medicine

## 2019-03-05 DIAGNOSIS — Z1231 Encounter for screening mammogram for malignant neoplasm of breast: Secondary | ICD-10-CM

## 2019-03-06 ENCOUNTER — Telehealth: Payer: Self-pay

## 2019-03-06 DIAGNOSIS — N6315 Unspecified lump in the right breast, overlapping quadrants: Secondary | ICD-10-CM

## 2019-03-06 NOTE — Telephone Encounter (Signed)
Patient vm box is full-called to inform patient her mammogram has been ordered and can call to schedule.

## 2019-03-06 NOTE — Telephone Encounter (Signed)
Copied from [place] [id number]. Topic: General - Other >> Mar 05, 2019  3:03 PM Erick Blinks wrote: [phone] pt is requesting a mammogram

## 2019-03-10 ENCOUNTER — Ambulatory Visit: Payer: Medicare Other | Admitting: Physical Therapy

## 2019-03-10 ENCOUNTER — Other Ambulatory Visit: Payer: Self-pay

## 2019-03-10 ENCOUNTER — Encounter: Payer: Self-pay | Admitting: Physical Therapy

## 2019-03-10 DIAGNOSIS — G8929 Other chronic pain: Secondary | ICD-10-CM | POA: Diagnosis not present

## 2019-03-10 DIAGNOSIS — R296 Repeated falls: Secondary | ICD-10-CM | POA: Diagnosis not present

## 2019-03-10 DIAGNOSIS — R262 Difficulty in walking, not elsewhere classified: Secondary | ICD-10-CM | POA: Diagnosis not present

## 2019-03-10 DIAGNOSIS — M545 Low back pain: Secondary | ICD-10-CM | POA: Diagnosis not present

## 2019-03-10 NOTE — Therapy (Signed)
[facility] [address] [place], [address] Phone: [phone]   Fax:  [phone]  Physical Therapy Treatment  Patient Details  Name: Taylor [name] MRN: [id number] Date of Birth: 09-20-1932 No data recorded  Encounter Date: 03/10/2019  PT End of Session - 03/10/19 1514    Visit Number  11    Number of Visits  33    Date for PT Re-Evaluation  04/21/19    Authorization - Visit Number  1    Authorization - Number of Visits  10    PT Start Time  0230    PT Stop Time  0315    PT Time Calculation (min)  45 min    Activity Tolerance  Patient tolerated treatment well    Behavior During Therapy  [facility] for tasks assessed/performed       Past Medical History:  Diagnosis Date  . Abnormal mammogram, unspecified 2013  . Breast screening, unspecified 2013  . Diabetes mellitus without complication ([place]) [id number]   non insulin dependent  . GERD (gastroesophageal reflux disease)   . Glaucoma 2003  . Gout   . Hyperlipidemia 2008  . Hypertension 1980's  . Lump or mass in breast 01/03/2012   left breast  . Obesity, unspecified 2013  . Osteoporosis   . Shingles 2013  . Special screening for malignant neoplasms, colon 2013    Past Surgical History:  Procedure Laterality Date  . ABDOMINAL HYSTERECTOMY  1958   menorrhagia  . BACK SURGERY    . BREAST BIOPSY Left 01/25/2012  . CATARACT EXTRACTION, BILATERAL Bilateral    1st 06/07/15 and the 2nd 06/21/15  . CHOLECYSTECTOMY    . COLONOSCOPY  2003   [facility]  . CORONARY ARTERY BYPASS GRAFT Left 01/03/2012,01/24/2012   Left FNA, Left breast [facility] bx  . KNEE SURGERY  2009,2011,2012   twice on right and once on left  . KNEE SURGERY    . LIPOMA EXCISION  1998  . [place]  2004  . TONSILLECTOMY     age of 67    There were no vitals filed for this visit.  Subjective Assessment - 03/10/19 1435    Subjective  Reports no pain today. No falls since last visit. Reports  some compliance with HEP    Pertinent History  Patinet is a 86 year old female presenting to clinic for LBP. Patient has LBP that bothers her when she stands up for 10-30mins for ADLS. Back pain does not radiate and feels achy. She can only sit without back support for 17mins without pain. Worst pain in the past week 9/10 best: 5/10. Reports she has had about 3 falls in the past year, and is usually falling backwards. Reports she is very unsteady with stepping onto varied surfaces. Ambulates with a hurry cane in her LUE and uses her RUE to furiture walk, and has a RW that she will use when she has someone to put it in her car with her when she goes out. When she gets to a store, she uses cart to help her walk. She lives alone and completes her own cooking, cleaning, but has trouble standing for the time it takes to cook/do dishing. Patient reports she gives herself bird baths in her bathroom because she cannot step over her tub to get into her tub/shower that has a seat in it. She drives and completes her own errands. When her church was open she enjoyed attending, enjoys playing  games on her Ipad, and has friends in the community that come and check on her daily, as well as being apart of the "Are You Okay" program with the [place] police (they call daily and if you do not answer they will send the police there to check on you). No steps to enter 1 story home, has a ramp. Denies sensation deficits.  Pt denies N/V, B&B changes, unexplained weight fluctuation, saddle paresthesia, fever, night sweats, or unrelenting night pain at this time.    Limitations  Sitting;Lifting;Standing;Walking    How long can you sit comfortably?  without support 10-56mins    How long can you stand comfortably?  10-20mins    How long can you walk comfortably?  with cane 25mins    Patient Stated Goals  Walk better    Pain Onset  More than a month ago         Ther-Ex - Nustep seat setting 6 BUE 7L21mins; L2 2minsfor LE muscle  activation with AAROM spine rotation with no increased pain; SPM over 65 - STS from elevated mat table without UE use x3sec in standing to establish balance x6; with alt foot forward and back following stand x6 with some difficulty with balance with bringing foot back to COM, losing balance posteriorly, minA needed occasionally with good carry over of keeping COM over BOS to prevent posterior lean with high guard  - Standing alt toe tap onto foam x6 with difficulty with LLE wt shift and RLE foot clearance with minA needed for this - Standing weight shifting x10 with guarding for safety; with across body reaching with visual target minA needed occasionally to prevent LOB - Seated alt hip flexion 2x 10 with cuing for posture with good carry over but minimal lift. PT educated patient that this is okay, and that it is quality over quantity which she understands    Gait Training  Outside down decline >5% grade to parking lot with curb step down, able to complete without HHA, SPC only, supervision for safety                       PT Education - 03/10/19 1507    Education Details  therex form, gait training    Person(s) Educated  Patient    Methods  Explanation;Demonstration;Tactile cues;Verbal cues    Comprehension  Verbalized understanding;Returned demonstration;Verbal cues required;Tactile cues required       PT Short Term Goals - 03/03/19 1456      PT SHORT TERM GOAL #1   Title  Pt will be independent with HEP in order to improve strength and decrease back pain in order to improve pain-free function at home and increase QOL    Baseline  03/03/19 Completing HEP with 50% compliance    Time  4    Period  Weeks    Status  On-going      PT SHORT TERM GOAL #2   Title  Pt will improve BERG by at least 3 points in order to demonstrate clinically significant improvement in balance.    Baseline  03/03/19 31/56    Time  6    Period  Weeks    Status  Achieved      PT SHORT TERM  GOAL #3   Title  Pt will increase 10MWT by at least 0.13 m/s in order to demonstrate clinically significant improvement in community ambulation.    Baseline  12/30/18 0.18 m/s    Time  6  Period  Weeks    Status  On-going      PT SHORT TERM GOAL #4   Title  Pt will decrease 5TSTS by at least 3 seconds in order to demonstrate clinically significant improvement in LE strength    Baseline  03/03/19 36sec    Time  6    Period  Weeks    Status  On-going        PT Long Term Goals - 03/03/19 1517      PT LONG TERM GOAL #1   Title  Patient will obtain a score of 46/56 on the BERG to demonstrate decreased fall risk    Baseline  03/02/54 31/56    Time  12    Period  Weeks    Status  On-going      PT LONG TERM GOAL #2   Title  Patient will demonstrate walk speed of 0.8-1.68m/s on 10MWT in order to demonstrate household ambulator walk speed with safety    Baseline  12/30/18 0.24m/s with HHA with SPC, needing min-modA to  Prevent LOB on 4 occasions    Time  12    Period  Weeks    Status  On-going      PT LONG TERM GOAL #3   Title  Patient will complete 5xSTS without UE support in 14.8sec in order to demonstrate age matched strength norms    Baseline  03/03/19 36sec    Time  12    Period  Weeks    Status  On-going      PT LONG TERM GOAL #4   Title  Patient will be able to ambulate 65ft with SPC without LOB/need for external support in order to demonstrate safety walking household distances/into story    Baseline  01/01/20 able to walk 53ft with HHA and SPC, 22ft without RUE touching consistently    Time  12    Period  Weeks    Status  On-going            Plan - 03/10/19 1515    Clinical Impression Statement  PT continued progression for BLE and core strengthening, carry over into functional dynamic balance activity. Patient is improving her ability to demonstrate standing balance with dynamic tasks and is continuing to respond well to cuing to maintain COM over BOS with LE  advancement, though she does continue to have post LOB with this. PT will continue progression as able.    Personal Factors and Comorbidities  Age;Fitness;Comorbidity 1;Comorbidity 2;Comorbidity 3+;Past/Current Experience;Sex    Comorbidities  HTN, HLD, GERD, osteoporosis    Examination-Activity Limitations  Bathing;Sit;Dressing;Transfers;Bed Mobility;Bend;Lift;Carry;Reach Overhead;Stand;Stairs    Examination-Participation Restrictions  Church;Laundry;Cleaning;Community Activity;Meal Prep    Stability/Clinical Decision Making  Evolving/Moderate complexity    Clinical Decision Making  Moderate    Rehab Potential  Good    PT Frequency  2x / week    PT Duration  8 weeks    PT Treatment/Interventions  Moist Heat;Traction;Gait training;Stair training;Balance training;Dry needling;Joint Manipulations;Spinal Manipulations;Passive range of motion;Manual techniques;Patient/family education;Therapeutic exercise;Electrical Stimulation;ADLs/Self Care Home Management;Therapeutic activities;Functional mobility training;Energy conservation;Neuromuscular re-education    PT Next Visit Plan  Balance training, LE/core strengthening    PT Home Exercise Plan  STS from chair, seated hip abd, seated marching    Consulted and Agree with Plan of Care  Patient       Patient will benefit from skilled therapeutic intervention in order to improve the following deficits and impairments:  Abnormal gait, Decreased balance, Decreased endurance, Decreased mobility, Difficulty  walking, Hypomobility, Cardiopulmonary status limiting activity, Decreased range of motion, Improper body mechanics, Decreased activity tolerance, Decreased coordination, Decreased safety awareness, Decreased strength, Increased fascial restricitons, Impaired flexibility, Postural dysfunction, Pain  Visit Diagnosis: Chronic midline low back pain without sciatica  Difficulty in walking, not elsewhere classified  Repeated falls     Problem  List Patient Active Problem List   Diagnosis Date Noted  . Morbid obesity ([place]) 04/08/2018  . Age-related osteoporosis without current pathological fracture 09/26/2017  . Pain in both hands 09/23/2017  . Hyperparathyroidism ([place]) 08/06/2017  . Chronic kidney disease, stage III (moderate) 08/06/2017  . HLD (hyperlipidemia) 07/25/2017  . Osteoporotic compression fracture of spine ([place]) 03/22/2017  . Assistance needed with transportation 03/21/2017  . Iron deficiency anemia 12/19/2016  . Aortic atherosclerosis ([place]) 11/23/2016  . Anterolisthesis 11/23/2016  . Chronic pain syndrome 09/13/2016  . Vitamin D deficiency 08/19/2016  . Anemia, unspecified 08/19/2016  . Primary open angle glaucoma (POAG) of both eyes, severe stage 08/17/2016  . Hypertensive retinopathy 08/17/2016  . Long term current use of opiate analgesic 07/19/2016  . Dyslipidemia associated with type 2 diabetes mellitus ([place]) 04/16/2016  . Chronic midline low back pain without sciatica 04/16/2016  . Chronic pain of right knee 04/16/2016  . Vaginal dryness 02/22/2015  . Calculus of kidney 11/10/2014  . Osteoarthritis, multiple sites 09/09/2014  . Type 2 diabetes mellitus with microalbuminuria ([place]) 09/09/2014  . Glaucoma 09/09/2014  . Benign hypertension with CKD (chronic kidney disease) stage III ([place]) 09/09/2014  . Chronic pain 09/09/2014  . Tinea corporis 09/09/2014  . Umbilical hernia [id number]  . Lump or mass in breast    Shelton Silvas PT, DPT Shelton Silvas 03/10/2019, 3:20 PM  [facility] [address] [place], [address] Phone: [phone]   Fax:  [phone]  Name: Taylor [name] MRN: [id number] Date of Birth: [birth year]

## 2019-03-11 ENCOUNTER — Ambulatory Visit: Payer: Medicare Other | Attending: Anesthesiology | Admitting: Anesthesiology

## 2019-03-11 ENCOUNTER — Encounter: Payer: Self-pay | Admitting: Anesthesiology

## 2019-03-11 DIAGNOSIS — Z79891 Long term (current) use of opiate analgesic: Secondary | ICD-10-CM

## 2019-03-11 DIAGNOSIS — M79641 Pain in right hand: Secondary | ICD-10-CM

## 2019-03-11 DIAGNOSIS — M545 Low back pain, unspecified: Secondary | ICD-10-CM

## 2019-03-11 DIAGNOSIS — M79642 Pain in left hand: Secondary | ICD-10-CM

## 2019-03-11 DIAGNOSIS — G894 Chronic pain syndrome: Secondary | ICD-10-CM

## 2019-03-11 DIAGNOSIS — M25561 Pain in right knee: Secondary | ICD-10-CM | POA: Diagnosis not present

## 2019-03-11 DIAGNOSIS — G8929 Other chronic pain: Secondary | ICD-10-CM

## 2019-03-11 DIAGNOSIS — M159 Polyosteoarthritis, unspecified: Secondary | ICD-10-CM

## 2019-03-11 DIAGNOSIS — M25562 Pain in left knee: Secondary | ICD-10-CM

## 2019-03-11 MED ORDER — OXYCODONE-ACETAMINOPHEN 7.5-325 MG PO TABS: 1.0000 | ORAL_TABLET | Freq: Three times a day (TID) | ORAL | 0 refills | Status: AC | PRN

## 2019-03-11 MED ORDER — OXYCODONE-ACETAMINOPHEN 7.5-325 MG PO TABS: 1.0000 | ORAL_TABLET | Freq: Three times a day (TID) | ORAL | 0 refills | Status: DC | PRN

## 2019-03-12 ENCOUNTER — Ambulatory Visit: Payer: Medicare Other | Admitting: Physical Therapy

## 2019-03-12 ENCOUNTER — Other Ambulatory Visit: Payer: Self-pay

## 2019-03-12 ENCOUNTER — Encounter: Payer: Self-pay | Admitting: Physical Therapy

## 2019-03-12 DIAGNOSIS — M545 Low back pain, unspecified: Secondary | ICD-10-CM

## 2019-03-12 DIAGNOSIS — G8929 Other chronic pain: Secondary | ICD-10-CM

## 2019-03-12 DIAGNOSIS — R262 Difficulty in walking, not elsewhere classified: Secondary | ICD-10-CM | POA: Diagnosis not present

## 2019-03-12 DIAGNOSIS — R296 Repeated falls: Secondary | ICD-10-CM

## 2019-03-12 NOTE — Therapy (Signed)
[facility] [address], [place], [address] Phone: [phone]   Fax:  [phone]  Physical Therapy Treatment  Patient Details  Name: Taylor [name] MRN: [id number] Date of Birth: [birth year] No data recorded  Encounter Date: 03/12/2019  PT End of Session - 03/12/19 1450    Visit Number  12    Number of Visits  33    Date for PT Re-Evaluation  04/21/19    Authorization - Visit Number  2    Authorization - Number of Visits  10    PT Start Time  0230    PT Stop Time  0315    PT Time Calculation (min)  45 min    Activity Tolerance  Patient tolerated treatment well    Behavior During Therapy  [facility] for tasks assessed/performed       Past Medical History:  Diagnosis Date  . Abnormal mammogram, unspecified 2013  . Breast screening, unspecified 2013  . Diabetes mellitus without complication ([place]) [id number]   non insulin dependent  . GERD (gastroesophageal reflux disease)   . Glaucoma 2003  . Gout   . Hyperlipidemia 2008  . Hypertension 1980's  . Lump or mass in breast 01/03/2012   left breast  . Obesity, unspecified 2013  . Osteoporosis   . Shingles 2013  . Special screening for malignant neoplasms, colon 2013    Past Surgical History:  Procedure Laterality Date  . ABDOMINAL HYSTERECTOMY  1958   menorrhagia  . BACK SURGERY    . BREAST BIOPSY Left 01/25/2012  . CATARACT EXTRACTION, BILATERAL Bilateral    1st 06/07/15 and the 2nd 06/21/15  . CHOLECYSTECTOMY    . COLONOSCOPY  2003   [facility]  . CORONARY ARTERY BYPASS GRAFT Left 01/03/2012,01/24/2012   Left FNA, Left breast [facility] bx  . KNEE SURGERY  2009,2011,2012   twice on right and once on left  . KNEE SURGERY    . LIPOMA EXCISION  1998  . [place]  2004  . TONSILLECTOMY     age of 86    There were no vitals filed for this visit.  Subjective Assessment - 03/12/19 1434    Subjective  Reports no pain today, and no falls since last visit.  Patient reports she has been completing her HEP, and is walking better overall.    Pertinent History  Patinet is a 86 year old female presenting to clinic for LBP. Patient has LBP that bothers her when she stands up for 10-4mins for ADLS. Back pain does not radiate and feels achy. She can only sit without back support for 87mins without pain. Worst pain in the past week 9/10 best: 5/10. Reports she has had about 3 falls in the past year, and is usually falling backwards. Reports she is very unsteady with stepping onto varied surfaces. Ambulates with a hurry cane in her LUE and uses her RUE to furiture walk, and has a RW that she will use when she has someone to put it in her car with her when she goes out. When she gets to a store, she uses cart to help her walk. She lives alone and completes her own cooking, cleaning, but has trouble standing for the time it takes to cook/do dishing. Patient reports she gives herself bird baths in her bathroom because she cannot step over her tub to get into her tub/shower that has a seat in it. She drives and completes her own errands. When  her church was open she enjoyed attending, enjoys playing games on her Ipad, and has friends in the community that come and check on her daily, as well as being apart of the "Are You Okay" program with the [place] police (they call daily and if you do not answer they will send the police there to check on you). No steps to enter 1 story home, has a ramp. Denies sensation deficits.  Pt denies N/V, B&B changes, unexplained weight fluctuation, saddle paresthesia, fever, night sweats, or unrelenting night pain at this time.    Limitations  Sitting;Lifting;Standing;Walking    How long can you sit comfortably?  without support 10-59mins    How long can you stand comfortably?  10-69mins    How long can you walk comfortably?  with cane 39mins    Patient Stated Goals  Walk better    Pain Onset  More than a month ago       Ther-Ex - Nustep  seat setting 6 BUE 7L3 52minsfor LE muscle activation with AAROM spine rotation with no increased pain; SPM over 65 - Side stepping with BUE support 10 each direction; over 1in PVC pipe x10 each direction, standing break between, guarding for safety, good carry over of large step cuing - Step up onto 4in box BUE support 2x 6 each LE with cuing for knee flex on box and utilization of hip ext for rise with good carry over, CGA - STS from elevated mat table without UE use/with min min UE use 3x 6; with alt foot forward and back following stand x6 with some difficulty with balance with bringing foot back to COM, losing balance posteriorly, minA needed occasionally with good carry over of keeping COM over BOS to prevent posterior lean with high guard    Gait Training  Inside through clinic and outside down decline >5% grade to parking lot with curb step down, able to complete without HHA, SPC only, supervision for safety                        PT Education - 03/12/19 1449    Education Details  therex form    Person(s) Educated  Patient    Methods  Explanation;Demonstration;Verbal cues    Comprehension  Verbalized understanding;Returned demonstration;Verbal cues required       PT Short Term Goals - 03/03/19 1456      PT SHORT TERM GOAL #1   Title  Pt will be independent with HEP in order to improve strength and decrease back pain in order to improve pain-free function at home and increase QOL    Baseline  03/03/19 Completing HEP with 50% compliance    Time  4    Period  Weeks    Status  On-going      PT SHORT TERM GOAL #2   Title  Pt will improve BERG by at least 3 points in order to demonstrate clinically significant improvement in balance.    Baseline  03/03/19 31/56    Time  6    Period  Weeks    Status  Achieved      PT SHORT TERM GOAL #3   Title  Pt will increase 10MWT by at least 0.13 m/s in order to demonstrate clinically significant improvement in community  ambulation.    Baseline  12/30/18 0.18 m/s    Time  6    Period  Weeks    Status  On-going  PT SHORT TERM GOAL #4   Title  Pt will decrease 5TSTS by at least 3 seconds in order to demonstrate clinically significant improvement in LE strength    Baseline  03/03/19 36sec    Time  6    Period  Weeks    Status  On-going        PT Long Term Goals - 03/03/19 1517      PT LONG TERM GOAL #1   Title  Patient will obtain a score of 46/56 on the BERG to demonstrate decreased fall risk    Baseline  03/02/54 31/56    Time  12    Period  Weeks    Status  On-going      PT LONG TERM GOAL #2   Title  Patient will demonstrate walk speed of 0.8-1.58m/s on 10MWT in order to demonstrate household ambulator walk speed with safety    Baseline  12/30/18 0.64m/s with HHA with SPC, needing min-modA to  Prevent LOB on 4 occasions    Time  12    Period  Weeks    Status  On-going      PT LONG TERM GOAL #3   Title  Patient will complete 5xSTS without UE support in 14.8sec in order to demonstrate age matched strength norms    Baseline  03/03/19 36sec    Time  12    Period  Weeks    Status  On-going      PT LONG TERM GOAL #4   Title  Patient will be able to ambulate 19ft with SPC without LOB/need for external support in order to demonstrate safety walking household distances/into story    Baseline  01/01/20 able to walk 14ft with HHA and SPC, 79ft without RUE touching consistently    Time  12    Period  Weeks    Status  On-going            Plan - 03/12/19 1511    Clinical Impression Statement  PT continued therex progression for static and dynamic balance, and BLE strengthening. Patient is able to complete therex progression with good motivation, definite need for gaurding for safety. Patient is increasing ability to complete therex each session with good carry over of motor control. PT will continue progression as able.    Personal Factors and Comorbidities  Age;Fitness;Comorbidity  1;Comorbidity 2;Comorbidity 3+;Past/Current Experience;Sex    Comorbidities  HTN, HLD, GERD, osteoporosis    Examination-Activity Limitations  Bathing;Sit;Dressing;Transfers;Bed Mobility;Bend;Lift;Carry;Reach Overhead;Stand;Stairs    Examination-Participation Restrictions  Church;Laundry;Cleaning;Community Activity;Meal Prep    Stability/Clinical Decision Making  Evolving/Moderate complexity    Clinical Decision Making  Moderate    Rehab Potential  Good    PT Frequency  2x / week    PT Duration  8 weeks    PT Treatment/Interventions  Moist Heat;Traction;Gait training;Stair training;Balance training;Dry needling;Joint Manipulations;Spinal Manipulations;Passive range of motion;Manual techniques;Patient/family education;Therapeutic exercise;Electrical Stimulation;ADLs/Self Care Home Management;Therapeutic activities;Functional mobility training;Energy conservation;Neuromuscular re-education    PT Next Visit Plan  Balance training, LE/core strengthening    PT Home Exercise Plan  STS from chair, seated hip abd, seated marching    Consulted and Agree with Plan of Care  Patient       Patient will benefit from skilled therapeutic intervention in order to improve the following deficits and impairments:  Abnormal gait, Decreased balance, Decreased endurance, Decreased mobility, Difficulty walking, Hypomobility, Cardiopulmonary status limiting activity, Decreased range of motion, Improper body mechanics, Decreased activity tolerance, Decreased coordination, Decreased safety awareness, Decreased strength,  Increased fascial restricitons, Impaired flexibility, Postural dysfunction, Pain  Visit Diagnosis: Chronic midline low back pain without sciatica  Difficulty in walking, not elsewhere classified  Repeated falls     Problem List Patient Active Problem List   Diagnosis Date Noted  . Morbid obesity ([place]) 04/08/2018  . Age-related osteoporosis without current pathological fracture 09/26/2017  .  Pain in both hands 09/23/2017  . Hyperparathyroidism ([place]) 08/06/2017  . Chronic kidney disease, stage III (moderate) 08/06/2017  . HLD (hyperlipidemia) 07/25/2017  . Osteoporotic compression fracture of spine ([place]) 03/22/2017  . Assistance needed with transportation 03/21/2017  . Iron deficiency anemia 12/19/2016  . Aortic atherosclerosis ([place]) 11/23/2016  . Anterolisthesis 11/23/2016  . Chronic pain syndrome 09/13/2016  . Vitamin D deficiency 08/19/2016  . Anemia, unspecified 08/19/2016  . Primary open angle glaucoma (POAG) of both eyes, severe stage 08/17/2016  . Hypertensive retinopathy 08/17/2016  . Long term current use of opiate analgesic 07/19/2016  . Dyslipidemia associated with type 2 diabetes mellitus ([place]) 04/16/2016  . Chronic midline low back pain without sciatica 04/16/2016  . Chronic pain of right knee 04/16/2016  . Vaginal dryness 02/22/2015  . Calculus of kidney 11/10/2014  . Osteoarthritis, multiple sites 09/09/2014  . Type 2 diabetes mellitus with microalbuminuria ([place]) 09/09/2014  . Glaucoma 09/09/2014  . Benign hypertension with CKD (chronic kidney disease) stage III ([place]) 09/09/2014  . Chronic pain 09/09/2014  . Tinea corporis 09/09/2014  . Umbilical hernia [id number]  . Lump or mass in breast    Shelton Silvas PT, DPT Shelton Silvas 03/12/2019, 3:16 PM  [facility] [address], [place], [address] Phone: [phone]   Fax:  [phone]  Name: Taylor [name] MRN: [id number] Date of Birth: 06/01/1932

## 2019-03-17 ENCOUNTER — Ambulatory Visit: Payer: Medicare Other | Admitting: Physical Therapy

## 2019-03-17 ENCOUNTER — Encounter: Payer: Self-pay | Admitting: Physical Therapy

## 2019-03-17 ENCOUNTER — Other Ambulatory Visit: Payer: Self-pay

## 2019-03-17 DIAGNOSIS — R296 Repeated falls: Secondary | ICD-10-CM | POA: Diagnosis not present

## 2019-03-17 DIAGNOSIS — R262 Difficulty in walking, not elsewhere classified: Secondary | ICD-10-CM

## 2019-03-17 DIAGNOSIS — G8929 Other chronic pain: Secondary | ICD-10-CM

## 2019-03-17 DIAGNOSIS — M545 Low back pain: Secondary | ICD-10-CM | POA: Diagnosis not present

## 2019-03-17 NOTE — Therapy (Signed)
[facility] [address] [place], [address] Phone: [phone]   Fax:  [phone]  Physical Therapy Treatment  Patient Details  Name: Taylor [name] MRN: [id number] Date of Birth: [birth year] No data recorded  Encounter Date: 03/17/2019  PT End of Session - 03/17/19 1438    Visit Number  13    Number of Visits  33    Date for PT Re-Evaluation  04/21/19    Authorization - Visit Number  3    Authorization - Number of Visits  10    PT Start Time  0232    PT Stop Time  0315    PT Time Calculation (min)  43 min    Activity Tolerance  Patient tolerated treatment well    Behavior During Therapy  [facility] for tasks assessed/performed       Past Medical History:  Diagnosis Date  . Abnormal mammogram, unspecified 2013  . Breast screening, unspecified 2013  . Diabetes mellitus without complication ([place]) [id number]   non insulin dependent  . GERD (gastroesophageal reflux disease)   . Glaucoma 2003  . Gout   . Hyperlipidemia 2008  . Hypertension 1980's  . Lump or mass in breast 01/03/2012   left breast  . Obesity, unspecified 2013  . Osteoporosis   . Shingles 2013  . Special screening for malignant neoplasms, colon 2013    Past Surgical History:  Procedure Laterality Date  . ABDOMINAL HYSTERECTOMY  1958   menorrhagia  . BACK SURGERY    . BREAST BIOPSY Left 01/25/2012  . CATARACT EXTRACTION, BILATERAL Bilateral    1st 06/07/15 and the 2nd 06/21/15  . CHOLECYSTECTOMY    . COLONOSCOPY  2003   [facility]  . CORONARY ARTERY BYPASS GRAFT Left 01/03/2012,01/24/2012   Left FNA, Left breast [facility] bx  . KNEE SURGERY  2009,2011,2012   twice on right and once on left  . KNEE SURGERY    . LIPOMA EXCISION  1998  . [place]  2004  . TONSILLECTOMY     age of 86    There were no vitals filed for this visit.  Subjective Assessment - 03/17/19 1437    Subjective  Patinet reports she has not eaten yet today, ate some  candy for her sugar, PT gave patient animal crackers. Reports no falls and no pain today.    Pertinent History  Patinet is a 86 year old female presenting to clinic for LBP. Patient has LBP that bothers her when she stands up for 10-64mins for ADLS. Back pain does not radiate and feels achy. She can only sit without back support for 20mins without pain. Worst pain in the past week 9/10 best: 5/10. Reports she has had about 3 falls in the past year, and is usually falling backwards. Reports she is very unsteady with stepping onto varied surfaces. Ambulates with a hurry cane in her LUE and uses her RUE to furiture walk, and has a RW that she will use when she has someone to put it in her car with her when she goes out. When she gets to a store, she uses cart to help her walk. She lives alone and completes her own cooking, cleaning, but has trouble standing for the time it takes to cook/do dishing. Patient reports she gives herself bird baths in her bathroom because she cannot step over her tub to get into her tub/shower that has a seat in it. She drives and completes her  own errands. When her church was open she enjoyed attending, enjoys playing games on her Ipad, and has friends in the community that come and check on her daily, as well as being apart of the "Are You Okay" program with the [place] police (they call daily and if you do not answer they will send the police there to check on you). No steps to enter 1 story home, has a ramp. Denies sensation deficits.  Pt denies N/V, B&B changes, unexplained weight fluctuation, saddle paresthesia, fever, night sweats, or unrelenting night pain at this time.    Limitations  Sitting;Lifting;Standing;Walking    How long can you sit comfortably?  without support 10-24mins    How long can you stand comfortably?  10-62mins    How long can you walk comfortably?  with cane 27mins    Patient Stated Goals  Walk better    Pain Onset  More than a month ago            Ther-Ex - Nustep seat setting6BUE 7L2 2mins (LE only d/t eating)for LE muscle activation with AAROM spine rotation with no increased pain; SPM over 65 - Seated alt hip flex 2x 12 with cuing initially for posture and to prevent posterior lean with raise with good carry over   Therapeutic Activity  STS from chair > amb 75ft with rotation and return to sit x3 trials; education on incorporating practice from previous sessions with STS + step and on pushing into cane with turn around cone as patient decreases speed and steadiness with this maneuver. All tests take >57min, fastest time 73min 11sec. Good carry over of education and demo of placing cane and walking around it to decrease feet and AD movement with increased steadiness Bending forward to pick up 5# DB with bilat UE and cuing for full stand. Patient with difficulty standing with wt without posterior LOB initially, but able to correct this very well. Bending forward to pick up > outstretching 5# forward with bilat UE x5 with visual target for where to place weight with good success, patient very apprehensive  Gait Training Inside through clinic and outside down decline >5% grade to parking lot with curb step down, able to complete without HHA, SPC only, supervision for safety                     PT Education - 03/17/19 1438    Education Details  therex form; nutrition for exercise    Person(s) Educated  Patient    Methods  Explanation;Demonstration;Tactile cues;Verbal cues    Comprehension  Verbalized understanding;Returned demonstration;Verbal cues required;Tactile cues required       PT Short Term Goals - 03/03/19 1456      PT SHORT TERM GOAL #1   Title  Pt will be independent with HEP in order to improve strength and decrease back pain in order to improve pain-free function at home and increase QOL    Baseline  03/03/19 Completing HEP with 50% compliance    Time  4    Period  Weeks    Status  On-going      PT  SHORT TERM GOAL #2   Title  Pt will improve BERG by at least 3 points in order to demonstrate clinically significant improvement in balance.    Baseline  03/03/19 31/56    Time  6    Period  Weeks    Status  Achieved      PT SHORT TERM GOAL #3  Title  Pt will increase 10MWT by at least 0.13 m/s in order to demonstrate clinically significant improvement in community ambulation.    Baseline  12/30/18 0.18 m/s    Time  6    Period  Weeks    Status  On-going      PT SHORT TERM GOAL #4   Title  Pt will decrease 5TSTS by at least 3 seconds in order to demonstrate clinically significant improvement in LE strength    Baseline  03/03/19 36sec    Time  6    Period  Weeks    Status  On-going        PT Long Term Goals - 03/03/19 1517      PT LONG TERM GOAL #1   Title  Patient will obtain a score of 46/56 on the BERG to demonstrate decreased fall risk    Baseline  03/02/54 31/56    Time  12    Period  Weeks    Status  On-going      PT LONG TERM GOAL #2   Title  Patient will demonstrate walk speed of 0.8-1.51m/s on 10MWT in order to demonstrate household ambulator walk speed with safety    Baseline  12/30/18 0.26m/s with HHA with SPC, needing min-modA to  Prevent LOB on 4 occasions    Time  12    Period  Weeks    Status  On-going      PT LONG TERM GOAL #3   Title  Patient will complete 5xSTS without UE support in 14.8sec in order to demonstrate age matched strength norms    Baseline  03/03/19 36sec    Time  12    Period  Weeks    Status  On-going      PT LONG TERM GOAL #4   Title  Patient will be able to ambulate 73ft with SPC without LOB/need for external support in order to demonstrate safety walking household distances/into story    Baseline  01/01/20 able to walk 83ft with HHA and SPC, 90ft without RUE touching consistently    Time  12    Period  Weeks    Status  On-going            Plan - 03/17/19 1513    Clinical Impression Statement  PT continued therex progression  for LE strengthening and balance with functional movements, with corrections needed for technique/motor control to prevent LOB, with patient able to adapt to all corrections well. Patient motivated throughout session and is able to complete all activites with seated rests between each. PT will continue progression as able.    Personal Factors and Comorbidities  Age;Fitness;Comorbidity 1;Comorbidity 2;Comorbidity 3+;Past/Current Experience;Sex    Comorbidities  HTN, HLD, GERD, osteoporosis    Examination-Activity Limitations  Bathing;Sit;Dressing;Transfers;Bed Mobility;Bend;Lift;Carry;Reach Overhead;Stand;Stairs    Examination-Participation Restrictions  Church;Laundry;Cleaning;Community Activity;Meal Prep    Stability/Clinical Decision Making  Evolving/Moderate complexity    Clinical Decision Making  Moderate    Rehab Potential  Good    PT Frequency  2x / week    PT Duration  8 weeks    PT Treatment/Interventions  Moist Heat;Traction;Gait training;Stair training;Balance training;Dry needling;Joint Manipulations;Spinal Manipulations;Passive range of motion;Manual techniques;Patient/family education;Therapeutic exercise;Electrical Stimulation;ADLs/Self Care Home Management;Therapeutic activities;Functional mobility training;Energy conservation;Neuromuscular re-education    PT Next Visit Plan  Balance training, LE/core strengthening    PT Home Exercise Plan  STS from chair, seated hip abd, seated marching    Consulted and Agree with Plan of Care  Patient  Patient will benefit from skilled therapeutic intervention in order to improve the following deficits and impairments:  Abnormal gait, Decreased balance, Decreased endurance, Decreased mobility, Difficulty walking, Hypomobility, Cardiopulmonary status limiting activity, Decreased range of motion, Improper body mechanics, Decreased activity tolerance, Decreased coordination, Decreased safety awareness, Decreased strength, Increased fascial  restricitons, Impaired flexibility, Postural dysfunction, Pain  Visit Diagnosis: No diagnosis found.     Problem List Patient Active Problem List   Diagnosis Date Noted  . Morbid obesity ([place]) 04/08/2018  . Age-related osteoporosis without current pathological fracture 09/26/2017  . Pain in both hands 09/23/2017  . Hyperparathyroidism ([place]) 08/06/2017  . Chronic kidney disease, stage III (moderate) 08/06/2017  . HLD (hyperlipidemia) 07/25/2017  . Osteoporotic compression fracture of spine ([place]) 03/22/2017  . Assistance needed with transportation 03/21/2017  . Iron deficiency anemia 12/19/2016  . Aortic atherosclerosis ([place]) 11/23/2016  . Anterolisthesis 11/23/2016  . Chronic pain syndrome 09/13/2016  . Vitamin D deficiency 08/19/2016  . Anemia, unspecified 08/19/2016  . Primary open angle glaucoma (POAG) of both eyes, severe stage 08/17/2016  . Hypertensive retinopathy 08/17/2016  . Long term current use of opiate analgesic 07/19/2016  . Dyslipidemia associated with type 2 diabetes mellitus ([place]) 04/16/2016  . Chronic midline low back pain without sciatica 04/16/2016  . Chronic pain of right knee 04/16/2016  . Vaginal dryness 02/22/2015  . Calculus of kidney 11/10/2014  . Osteoarthritis, multiple sites 09/09/2014  . Type 2 diabetes mellitus with microalbuminuria ([place]) 09/09/2014  . Glaucoma 09/09/2014  . Benign hypertension with CKD (chronic kidney disease) stage III ([place]) 09/09/2014  . Chronic pain 09/09/2014  . Tinea corporis 09/09/2014  . Umbilical hernia [id number]  . Lump or mass in breast    Shelton Silvas PT, DPT Shelton Silvas 03/17/2019, 3:15 PM  [facility] [address] [place], [address] Phone: [phone]   Fax:  [phone]  Name: Taylor [name] MRN: [id number] Date of Birth: Sep 13, 1932

## 2019-03-19 ENCOUNTER — Ambulatory Visit: Payer: Medicare Other | Admitting: Physical Therapy

## 2019-03-23 ENCOUNTER — Ambulatory Visit: Payer: Medicare Other | Admitting: Physical Therapy

## 2019-03-24 ENCOUNTER — Ambulatory Visit: Payer: Medicare Other | Attending: Family Medicine | Admitting: Physical Therapy

## 2019-03-24 ENCOUNTER — Other Ambulatory Visit: Payer: Self-pay

## 2019-03-24 ENCOUNTER — Encounter: Payer: Self-pay | Admitting: Physical Therapy

## 2019-03-24 ENCOUNTER — Other Ambulatory Visit: Payer: Medicare Other

## 2019-03-24 DIAGNOSIS — R296 Repeated falls: Secondary | ICD-10-CM | POA: Diagnosis not present

## 2019-03-24 DIAGNOSIS — M545 Low back pain: Secondary | ICD-10-CM | POA: Diagnosis not present

## 2019-03-24 DIAGNOSIS — R262 Difficulty in walking, not elsewhere classified: Secondary | ICD-10-CM | POA: Insufficient documentation

## 2019-03-24 DIAGNOSIS — G8929 Other chronic pain: Secondary | ICD-10-CM | POA: Diagnosis not present

## 2019-03-24 NOTE — Therapy (Signed)
[facility] [address], [place], [address] Phone: [phone]   Fax:  [phone]  Physical Therapy Treatment  Patient Details  Name: Taylor [name] MRN: [id number] Date of Birth: 1932/12/27 No data recorded  Encounter Date: 03/24/2019  PT End of Session - 03/24/19 1609    Visit Number  14    Number of Visits  33    Date for PT Re-Evaluation  04/21/19    Authorization - Visit Number  4    Authorization - Number of Visits  10    PT Start Time  0400    PT Stop Time  [id number]    PT Time Calculation (min)  45 min    Activity Tolerance  Patient tolerated treatment well    Behavior During Therapy  [facility] for tasks assessed/performed       Past Medical History:  Diagnosis Date  . Abnormal mammogram, unspecified 2013  . Breast screening, unspecified 2013  . Diabetes mellitus without complication ([place]) [id number]   non insulin dependent  . GERD (gastroesophageal reflux disease)   . Glaucoma 2003  . Gout   . Hyperlipidemia 2008  . Hypertension 1980's  . Lump or mass in breast 01/03/2012   left breast  . Obesity, unspecified 2013  . Osteoporosis   . Shingles 2013  . Special screening for malignant neoplasms, colon 2013    Past Surgical History:  Procedure Laterality Date  . ABDOMINAL HYSTERECTOMY  1958   menorrhagia  . BACK SURGERY    . BREAST BIOPSY Left 01/25/2012  . CATARACT EXTRACTION, BILATERAL Bilateral    1st 06/07/15 and the 2nd 06/21/15  . CHOLECYSTECTOMY    . COLONOSCOPY  2003   [facility]  . CORONARY ARTERY BYPASS GRAFT Left 01/03/2012,01/24/2012   Left FNA, Left breast [facility] bx  . KNEE SURGERY  2009,2011,2012   twice on right and once on left  . KNEE SURGERY    . LIPOMA EXCISION  1998  . [place]  2004  . TONSILLECTOMY     age of 68    There were no vitals filed for this visit.  Subjective Assessment - 03/24/19 1600    Subjective  Patient reports she has had some soreness at her stomach  muscles and legs that is getting better today. No falls since last visit. Some compliance with HEP.    Pertinent History  Patinet is a 86 year old female presenting to clinic for LBP. Patient has LBP that bothers her when she stands up for 10-67mins for ADLS. Back pain does not radiate and feels achy. She can only sit without back support for 70mins without pain. Worst pain in the past week 9/10 best: 5/10. Reports she has had about 3 falls in the past year, and is usually falling backwards. Reports she is very unsteady with stepping onto varied surfaces. Ambulates with a hurry cane in her LUE and uses her RUE to furiture walk, and has a RW that she will use when she has someone to put it in her car with her when she goes out. When she gets to a store, she uses cart to help her walk. She lives alone and completes her own cooking, cleaning, but has trouble standing for the time it takes to cook/do dishing. Patient reports she gives herself bird baths in her bathroom because she cannot step over her tub to get into her tub/shower that has a seat in it. She drives and completes  her own errands. When her church was open she enjoyed attending, enjoys playing games on her Ipad, and has friends in the community that come and check on her daily, as well as being apart of the "Are You Okay" program with the [place] police (they call daily and if you do not answer they will send the police there to check on you). No steps to enter 1 story home, has a ramp. Denies sensation deficits.  Pt denies N/V, B&B changes, unexplained weight fluctuation, saddle paresthesia, fever, night sweats, or unrelenting night pain at this time.    Limitations  Sitting;Lifting;Standing;Walking    How long can you sit comfortably?  without support 10-42mins    How long can you stand comfortably?  10-50mins    How long can you walk comfortably?  with cane 42mins    Patient Stated Goals  Walk better    Pain Onset  More than a month ago       Ther-Ex - Nustep seat setting6 L1 72mins; then L2 with UE L7 for LE muscle activation with AAROM spine rotation with no increased pain; SPM over 50 without UEs, 60 for last minutte - Step up onto 3in foam pad 2x 6 leading with each LE, standing break beween  - Alt cone taps with RUE support at treadmill bar and LUE on cane 2x 10 (5 each LE) wit difficulty with L wt shift and RLE hip and knee flex for foot raise with good compliance with TC for this - Standing rotation with bilat UE flex holding yellow theraball  Gait Training Inside through clinic and outside down decline >5% grade to parking lot with curb step down, able to complete without HHA, SPC only, CGA for safety. 1x occasion of modA needed to maintain upright balance, d/t gust of wind                          PT Education - 03/24/19 1608    Education Details  therex form    Person(s) Educated  Patient    Methods  Explanation;Demonstration;Verbal cues    Comprehension  Verbalized understanding;Returned demonstration;Verbal cues required       PT Short Term Goals - 03/03/19 1456      PT SHORT TERM GOAL #1   Title  Pt will be independent with HEP in order to improve strength and decrease back pain in order to improve pain-free function at home and increase QOL    Baseline  03/03/19 Completing HEP with 50% compliance    Time  4    Period  Weeks    Status  On-going      PT SHORT TERM GOAL #2   Title  Pt will improve BERG by at least 3 points in order to demonstrate clinically significant improvement in balance.    Baseline  03/03/19 31/56    Time  6    Period  Weeks    Status  Achieved      PT SHORT TERM GOAL #3   Title  Pt will increase 10MWT by at least 0.13 m/s in order to demonstrate clinically significant improvement in community ambulation.    Baseline  12/30/18 0.18 m/s    Time  6    Period  Weeks    Status  On-going      PT SHORT TERM GOAL #4   Title  Pt will decrease 5TSTS by at least 3  seconds in order to demonstrate clinically significant  improvement in LE strength    Baseline  03/03/19 36sec    Time  6    Period  Weeks    Status  On-going        PT Long Term Goals - 03/03/19 1517      PT LONG TERM GOAL #1   Title  Patient will obtain a score of 46/56 on the BERG to demonstrate decreased fall risk    Baseline  03/02/54 31/56    Time  12    Period  Weeks    Status  On-going      PT LONG TERM GOAL #2   Title  Patient will demonstrate walk speed of 0.8-1.55m/s on 10MWT in order to demonstrate household ambulator walk speed with safety    Baseline  12/30/18 0.22m/s with HHA with SPC, needing min-modA to  Prevent LOB on 4 occasions    Time  12    Period  Weeks    Status  On-going      PT LONG TERM GOAL #3   Title  Patient will complete 5xSTS without UE support in 14.8sec in order to demonstrate age matched strength norms    Baseline  03/03/19 36sec    Time  12    Period  Weeks    Status  On-going      PT LONG TERM GOAL #4   Title  Patient will be able to ambulate 45ft with SPC without LOB/need for external support in order to demonstrate safety walking household distances/into story    Baseline  01/01/20 able to walk 36ft with HHA and SPC, 68ft without RUE touching consistently    Time  12    Period  Weeks    Status  On-going            Plan - 03/24/19 1709    Clinical Impression Statement  PT continued therex progression for balance and LE strengthening with good success. Patient is continuing to improve ability to weight shift, with continued need for gaurding for safety. Patient is demonstrating increased standing tolerance as well. PT will continue progression as able .    Personal Factors and Comorbidities  Age;Fitness;Comorbidity 1;Comorbidity 2;Comorbidity 3+;Past/Current Experience;Sex    Comorbidities  HTN, HLD, GERD, osteoporosis    Examination-Activity Limitations  Bathing;Sit;Dressing;Transfers;Bed Mobility;Bend;Lift;Carry;Reach  Overhead;Stand;Stairs    Examination-Participation Restrictions  Church;Laundry;Cleaning;Community Activity;Meal Prep    Stability/Clinical Decision Making  Evolving/Moderate complexity    Clinical Decision Making  Moderate    Rehab Potential  Good    PT Frequency  2x / week    PT Duration  8 weeks    PT Treatment/Interventions  Moist Heat;Traction;Gait training;Stair training;Balance training;Dry needling;Joint Manipulations;Spinal Manipulations;Passive range of motion;Manual techniques;Patient/family education;Therapeutic exercise;Electrical Stimulation;ADLs/Self Care Home Management;Therapeutic activities;Functional mobility training;Energy conservation;Neuromuscular re-education    PT Next Visit Plan  Balance training, LE/core strengthening    PT Home Exercise Plan  STS from chair, seated hip abd, seated marching    Consulted and Agree with Plan of Care  Patient       Patient will benefit from skilled therapeutic intervention in order to improve the following deficits and impairments:  Abnormal gait, Decreased balance, Decreased endurance, Decreased mobility, Difficulty walking, Hypomobility, Cardiopulmonary status limiting activity, Decreased range of motion, Improper body mechanics, Decreased activity tolerance, Decreased coordination, Decreased safety awareness, Decreased strength, Increased fascial restricitons, Impaired flexibility, Postural dysfunction, Pain  Visit Diagnosis: Chronic midline low back pain without sciatica  Difficulty in walking, not elsewhere classified  Repeated falls  Problem List Patient Active Problem List   Diagnosis Date Noted  . Morbid obesity ([place]) 04/08/2018  . Age-related osteoporosis without current pathological fracture 09/26/2017  . Pain in both hands 09/23/2017  . Hyperparathyroidism ([place]) 08/06/2017  . Chronic kidney disease, stage III (moderate) 08/06/2017  . HLD (hyperlipidemia) 07/25/2017  . Osteoporotic compression fracture of spine  ([place]) 03/22/2017  . Assistance needed with transportation 03/21/2017  . Iron deficiency anemia 12/19/2016  . Aortic atherosclerosis ([place]) 11/23/2016  . Anterolisthesis 11/23/2016  . Chronic pain syndrome 09/13/2016  . Vitamin D deficiency 08/19/2016  . Anemia, unspecified 08/19/2016  . Primary open angle glaucoma (POAG) of both eyes, severe stage 08/17/2016  . Hypertensive retinopathy 08/17/2016  . Long term current use of opiate analgesic 07/19/2016  . Dyslipidemia associated with type 2 diabetes mellitus ([place]) 04/16/2016  . Chronic midline low back pain without sciatica 04/16/2016  . Chronic pain of right knee 04/16/2016  . Vaginal dryness 02/22/2015  . Calculus of kidney 11/10/2014  . Osteoarthritis, multiple sites 09/09/2014  . Type 2 diabetes mellitus with microalbuminuria ([place]) 09/09/2014  . Glaucoma 09/09/2014  . Benign hypertension with CKD (chronic kidney disease) stage III ([place]) 09/09/2014  . Chronic pain 09/09/2014  . Tinea corporis 09/09/2014  . Umbilical hernia [id number]  . Lump or mass in breast    Shelton Silvas PT, DPT Shelton Silvas 03/24/2019, 5:19 PM  [facility] [address] [place], [address] Phone: [phone]   Fax:  [phone]  Name: Taylor [name] MRN: [id number] Date of Birth: 30-Dec-1932

## 2019-03-26 ENCOUNTER — Ambulatory Visit: Payer: Medicare Other | Admitting: Physical Therapy

## 2019-03-26 ENCOUNTER — Encounter: Payer: Self-pay | Admitting: Physical Therapy

## 2019-03-26 ENCOUNTER — Other Ambulatory Visit: Payer: Self-pay

## 2019-03-26 DIAGNOSIS — R262 Difficulty in walking, not elsewhere classified: Secondary | ICD-10-CM | POA: Diagnosis not present

## 2019-03-26 DIAGNOSIS — M545 Low back pain: Secondary | ICD-10-CM | POA: Diagnosis not present

## 2019-03-26 DIAGNOSIS — G8929 Other chronic pain: Secondary | ICD-10-CM

## 2019-03-26 DIAGNOSIS — R296 Repeated falls: Secondary | ICD-10-CM

## 2019-03-26 NOTE — Progress Notes (Signed)
Virtual Visit via Telephone Note  I connected with Taylor [name] on 03/26/19 at 12:45 PM EST by telephone and verified that I am speaking with the correct person using two identifiers.  Location: Patient: Home Provider: Pain control center   I discussed the limitations, risks, security and privacy concerns of performing an evaluation and management service by telephone and the availability of in person appointments. I also discussed with the patient that there may be a patient responsible charge related to this service. The patient expressed understanding and agreed to proceed.   History of Present Illness: I spoke with Taylor [name] today via telephone for her follow-up.  She was unable to do the video portion of the virtual conference.  She states that the quality characteristic distribution of low back pain is stable in nature.  No significant changes are noted.  She is taking medications as prescribed and these continue to give her good relief.  No side effects are reported.  Her bowel bladder function been stable lower extremity strength is been stable.  The majority of her pain is in her hands feet low back with occasional radiation to the legs    Observations/Objective:  Current Outpatient Medications:  .  acetaminophen (TYLENOL) 500 MG tablet, Take 1 tablet (500 mg total) by mouth 3 (three) times daily. (Patient taking differently: Take 500 mg by mouth as needed. ), Disp: 90 tablet, Rfl: 0 .  amLODipine (NORVASC) 10 MG tablet, TAKE 1 TABLET BY MOUTH EVERY DAY, Disp: 90 tablet, Rfl: 1 .  AZOPT 1 % ophthalmic suspension, INSTILL 1 DROP INTO BOTH EYES TWICE A DAY, Disp: , Rfl:  .  bimatoprost (LUMIGAN) 0.01 % SOLN, Place 1 drop into both eyes daily. , Disp: , Rfl:  .  brimonidine-timolol (COMBIGAN) 0.2-0.5 % ophthalmic solution, Place 1 drop into both eyes See admin instructions. 1 drop into both eyes in the morning, then 1 drop into both eyes 8 hours later, Disp: , Rfl:  .  Calcium  Carbonate-Vitamin D (OYSTER SHELL CALCIUM 500 + D PO), Take 1 tablet by mouth 2 (two) times a day., Disp: , Rfl:  .  Cholecalciferol (VITAMIN D-1000 MAX ST) 1000 UNITS tablet, Take 1,000 Units by mouth daily. Reported on 08/05/2015, Disp: , Rfl:  .  famotidine (PEPCID) 20 MG tablet, TAKE 1 TABLET BY MOUTH EVERY DAY, Disp: 90 tablet, Rfl: 1 .  ferrous sulfate 325 (65 FE) MG tablet, Take 1 tablet (325 mg total) by mouth daily with breakfast., Disp: 90 tablet, Rfl: 1 .  fluticasone (FLONASE) 50 MCG/ACT nasal spray, SPRAY 2 SPRAYS INTO EACH NOSTRIL EVERY DAY (Patient taking differently: PRN), Disp: 16 g, Rfl: 1 .  glucose blood test strip, Use as instructed, Disp: 100 each, Rfl: 12 .  metFORMIN (GLUCOPHAGE-XR) 750 MG 24 hr tablet, TAKE 2 TABLETS (1,500 MG TOTAL) BY MOUTH DAILY WITH BREAKFAST., Disp: 180 tablet, Rfl: 1 .  olmesartan (BENICAR) 20 MG tablet, Take 1 tablet (20 mg total) by mouth daily., Disp: 90 tablet, Rfl: 1 .  omega-3 acid ethyl esters (LOVAZA) 1 g capsule, TAKE 1 CAPSULE (1 G TOTAL) BY MOUTH 2 (TWO) TIMES DAILY., Disp: 180 capsule, Rfl: 0 .  OneTouch Delica Lancets [id number] MISC, 1 each by Does not apply route daily., Disp: 100 each, Rfl: 1 .  [START ON 04/04/2019] oxyCODONE-acetaminophen (PERCOCET) 7.5-325 MG tablet, Take 1 tablet by mouth every 8 (eight) hours as needed for moderate pain or severe pain., Disp: 90 tablet, Rfl: 0 .  [START  ON 05/03/2019] oxyCODONE-acetaminophen (PERCOCET) 7.5-325 MG tablet, Take 1 tablet by mouth every 8 (eight) hours as needed for moderate pain or severe pain., Disp: 90 tablet, Rfl: 0 .  pravastatin (PRAVACHOL) 40 MG tablet, Take 1 tablet (40 mg total) by mouth daily., Disp: 90 tablet, Rfl: 1 .  PREMARIN vaginal cream, PLACE 1 APPLICATORFUL VAGINALLY DAILY., Disp: 30 g, Rfl: 5  Assessment and Plan: 1. Chronic pain syndrome   2. Chronic pain of right knee   3. Pain in both hands   4. Chronic midline low back pain without sciatica   5. Long term prescription  opiate use   6. Chronic pain of both knees   7. Osteoarthritis of multiple joints, unspecified osteoarthritis type   Based on our discussion today we will renew her medications for the next 2 months with scheduled return to clinic in approximately 2 months.  I want her to continue with back stretching strengthening exercises as previously reviewed.  We will keep her on the same medication regimen in the meantime and she is to continue follow-up with her primary care physicians for baseline medical care.  She instructed to contact us the pain control center for other pain related questions.  Follow Up Instructions:    I discussed the assessment and treatment plan with the patient. The patient was provided an opportunity to ask questions and all were answered. The patient agreed with the plan and demonstrated an understanding of the instructions.   The patient was advised to call back or seek an in-person evaluation if the symptoms worsen or if the condition fails to improve as anticipated.  I provided 30 minutes of non-face-to-face time during this encounter.   [name], MD

## 2019-03-26 NOTE — Therapy (Signed)
[facility] [address] [place], [address] Phone: [phone]   Fax:  [phone]  Physical Therapy Treatment  Patient Details  Name: Taylor [name] MRN: [id number] Date of Birth: August 26, 1932 No data recorded  Encounter Date: 03/26/2019  PT End of Session - 03/26/19 1434    Visit Number  15    Number of Visits  33    Date for PT Re-Evaluation  04/21/19    Authorization - Visit Number  5    Authorization - Number of Visits  10    PT Start Time  0230    PT Stop Time  0315    PT Time Calculation (min)  45 min    Activity Tolerance  Patient tolerated treatment well    Behavior During Therapy  [facility] for tasks assessed/performed       Past Medical History:  Diagnosis Date  . Abnormal mammogram, unspecified 2013  . Breast screening, unspecified 2013  . Diabetes mellitus without complication ([place]) [id number]   non insulin dependent  . GERD (gastroesophageal reflux disease)   . Glaucoma 2003  . Gout   . Hyperlipidemia 2008  . Hypertension 1980's  . Lump or mass in breast 01/03/2012   left breast  . Obesity, unspecified 2013  . Osteoporosis   . Shingles 2013  . Special screening for malignant neoplasms, colon 2013    Past Surgical History:  Procedure Laterality Date  . ABDOMINAL HYSTERECTOMY  1958   menorrhagia  . BACK SURGERY    . BREAST BIOPSY Left 01/25/2012  . CATARACT EXTRACTION, BILATERAL Bilateral    1st 06/07/15 and the 2nd 06/21/15  . CHOLECYSTECTOMY    . COLONOSCOPY  2003   [facility]  . CORONARY ARTERY BYPASS GRAFT Left 01/03/2012,01/24/2012   Left FNA, Left breast [facility] bx  . KNEE SURGERY  2009,2011,2012   twice on right and once on left  . KNEE SURGERY    . LIPOMA EXCISION  1998  . [place]  2004  . TONSILLECTOMY     age of 23    There were no vitals filed for this visit.  Subjective Assessment - 03/26/19 1432    Subjective  Pt reports some increased neck pain today of insideous  onset. No LBP today, and no falls since last visit. Reports se has been doing some of her HEP    Pertinent History  Patinet is a 86 year old female presenting to clinic for LBP. Patient has LBP that bothers her when she stands up for 10-51mins for ADLS. Back pain does not radiate and feels achy. She can only sit without back support for 55mins without pain. Worst pain in the past week 9/10 best: 5/10. Reports she has had about 3 falls in the past year, and is usually falling backwards. Reports she is very unsteady with stepping onto varied surfaces. Ambulates with a hurry cane in her LUE and uses her RUE to furiture walk, and has a RW that she will use when she has someone to put it in her car with her when she goes out. When she gets to a store, she uses cart to help her walk. She lives alone and completes her own cooking, cleaning, but has trouble standing for the time it takes to cook/do dishing. Patient reports she gives herself bird baths in her bathroom because she cannot step over her tub to get into her tub/shower that has a seat in it. She drives and  completes her own errands. When her church was open she enjoyed attending, enjoys playing games on her Ipad, and has friends in the community that come and check on her daily, as well as being apart of the "Are You Okay" program with the [place] police (they call daily and if you do not answer they will send the police there to check on you). No steps to enter 1 story home, has a ramp. Denies sensation deficits.  Pt denies N/V, B&B changes, unexplained weight fluctuation, saddle paresthesia, fever, night sweats, or unrelenting night pain at this time.    Limitations  Sitting;Lifting;Standing;Walking    How long can you sit comfortably?  without support 10-82mins    How long can you stand comfortably?  10-83mins    How long can you walk comfortably?  with cane 22mins    Patient Stated Goals  Walk better    Pain Onset  More than a month ago           Ther-Ex - Nustep seat setting with UE L7 L2 32mins, L3 25mins for LE muscle activation with AAROM spine rotation with no increased pain; SPM over 50 without UEs, 60 for last minutte - Cone reach to stack reaching outside BOS for 8 cones to reach to stack to SPT with PT guarding; RUE and LUE reaching, TC for reach without moving LEs for far cones with decent success. Cuing on pushing "straight down" into cane so that it can provided stability   Gait Training Ambulation over 125ft with speed changes called out by PT. CGA with occasional minA from PT to maintain balance around turns. Difficulty with speed changes, very minimal change noted, cuing needed to prevent RUE support on various surfaces, high guard noted Ambulation over 64ft negotiating over 2 hurdles with difficulty with initial step over d/t posterior lean, fear of bringing trunk forward (reports she is going to "fall on her face), is able to complete with PT lending hand anteriorly for reaching to encourage forward trunk lean; good negotiation of second step (LLE) Inside through clinic and outside down decline >5% grade to parking lot with curb step down, able to complete without HHA, SPC only, CGA for safety. 1x occasion of modA needed to maintain upright balance, d/t gust of wind                      PT Education - 03/26/19 1433    Education Details  therex form, gait training    Person(s) Educated  Patient    Methods  Explanation;Demonstration;Verbal cues;Tactile cues    Comprehension  Verbalized understanding;Returned demonstration;Verbal cues required;Tactile cues required       PT Short Term Goals - 03/03/19 1456      PT SHORT TERM GOAL #1   Title  Pt will be independent with HEP in order to improve strength and decrease back pain in order to improve pain-free function at home and increase QOL    Baseline  03/03/19 Completing HEP with 50% compliance    Time  4    Period  Weeks    Status  On-going      PT  SHORT TERM GOAL #2   Title  Pt will improve BERG by at least 3 points in order to demonstrate clinically significant improvement in balance.    Baseline  03/03/19 31/56    Time  6    Period  Weeks    Status  Achieved      PT SHORT  TERM GOAL #3   Title  Pt will increase 10MWT by at least 0.13 m/s in order to demonstrate clinically significant improvement in community ambulation.    Baseline  12/30/18 0.18 m/s    Time  6    Period  Weeks    Status  On-going      PT SHORT TERM GOAL #4   Title  Pt will decrease 5TSTS by at least 3 seconds in order to demonstrate clinically significant improvement in LE strength    Baseline  03/03/19 36sec    Time  6    Period  Weeks    Status  On-going        PT Long Term Goals - 03/03/19 1517      PT LONG TERM GOAL #1   Title  Patient will obtain a score of 46/56 on the BERG to demonstrate decreased fall risk    Baseline  03/02/54 31/56    Time  12    Period  Weeks    Status  On-going      PT LONG TERM GOAL #2   Title  Patient will demonstrate walk speed of 0.8-1.59m/s on 10MWT in order to demonstrate household ambulator walk speed with safety    Baseline  12/30/18 0.64m/s with HHA with SPC, needing min-modA to  Prevent LOB on 4 occasions    Time  12    Period  Weeks    Status  On-going      PT LONG TERM GOAL #3   Title  Patient will complete 5xSTS without UE support in 14.8sec in order to demonstrate age matched strength norms    Baseline  03/03/19 36sec    Time  12    Period  Weeks    Status  On-going      PT LONG TERM GOAL #4   Title  Patient will be able to ambulate 61ft with SPC without LOB/need for external support in order to demonstrate safety walking household distances/into story    Baseline  01/01/20 able to walk 50ft with HHA and SPC, 70ft without RUE touching consistently    Time  12    Period  Weeks    Status  On-going            Plan - 03/26/19 1515    Clinical Impression Statement  PT continued therex progression  for increased static and dynamic balance with good success. Patinet is able to complete all therex and gait training tasks with success, with some encouragement needed, d/t fear of falling, and cuing for sequencing and technique. Pt is ultimately able to comply with cuing for safety and technique, though increased time and multi-modal cuing is needed. PT will continue progression as able.    Personal Factors and Comorbidities  Age;Fitness;Comorbidity 1;Comorbidity 2;Comorbidity 3+;Past/Current Experience;Sex    Comorbidities  HTN, HLD, GERD, osteoporosis    Examination-Activity Limitations  Bathing;Sit;Dressing;Transfers;Bed Mobility;Bend;Lift;Carry;Reach Overhead;Stand;Stairs    Examination-Participation Restrictions  Church;Laundry;Cleaning;Community Activity;Meal Prep    Stability/Clinical Decision Making  Evolving/Moderate complexity    Clinical Decision Making  Moderate    Rehab Potential  Good    PT Frequency  2x / week    PT Duration  8 weeks    PT Treatment/Interventions  Moist Heat;Traction;Gait training;Stair training;Balance training;Dry needling;Joint Manipulations;Spinal Manipulations;Passive range of motion;Manual techniques;Patient/family education;Therapeutic exercise;Electrical Stimulation;ADLs/Self Care Home Management;Therapeutic activities;Functional mobility training;Energy conservation;Neuromuscular re-education    PT Next Visit Plan  Balance training, LE/core strengthening    PT Home Exercise Plan  STS from  chair, seated hip abd, seated marching    Consulted and Agree with Plan of Care  Patient       Patient will benefit from skilled therapeutic intervention in order to improve the following deficits and impairments:  Abnormal gait, Decreased balance, Decreased endurance, Decreased mobility, Difficulty walking, Hypomobility, Cardiopulmonary status limiting activity, Decreased range of motion, Improper body mechanics, Decreased activity tolerance, Decreased coordination,  Decreased safety awareness, Decreased strength, Increased fascial restricitons, Impaired flexibility, Postural dysfunction, Pain  Visit Diagnosis: Chronic midline low back pain without sciatica  Difficulty in walking, not elsewhere classified  Repeated falls     Problem List Patient Active Problem List   Diagnosis Date Noted  . Morbid obesity ([place]) 04/08/2018  . Age-related osteoporosis without current pathological fracture 09/26/2017  . Pain in both hands 09/23/2017  . Hyperparathyroidism ([place]) 08/06/2017  . Chronic kidney disease, stage III (moderate) 08/06/2017  . HLD (hyperlipidemia) 07/25/2017  . Osteoporotic compression fracture of spine ([place]) 03/22/2017  . Assistance needed with transportation 03/21/2017  . Iron deficiency anemia 12/19/2016  . Aortic atherosclerosis ([place]) 11/23/2016  . Anterolisthesis 11/23/2016  . Chronic pain syndrome 09/13/2016  . Vitamin D deficiency 08/19/2016  . Anemia, unspecified 08/19/2016  . Primary open angle glaucoma (POAG) of both eyes, severe stage 08/17/2016  . Hypertensive retinopathy 08/17/2016  . Long term current use of opiate analgesic 07/19/2016  . Dyslipidemia associated with type 2 diabetes mellitus ([place]) 04/16/2016  . Chronic midline low back pain without sciatica 04/16/2016  . Chronic pain of right knee 04/16/2016  . Vaginal dryness 02/22/2015  . Calculus of kidney 11/10/2014  . Osteoarthritis, multiple sites 09/09/2014  . Type 2 diabetes mellitus with microalbuminuria ([place]) 09/09/2014  . Glaucoma 09/09/2014  . Benign hypertension with CKD (chronic kidney disease) stage III ([place]) 09/09/2014  . Chronic pain 09/09/2014  . Tinea corporis 09/09/2014  . Umbilical hernia [id number]  . Lump or mass in breast    Shelton Silvas PT, DPT Shelton Silvas 03/26/2019, 3:20 PM  [facility] [address] [place], [address] Phone: [phone]   Fax:   [phone]  Name: Taylor [name] MRN: [id number] Date of Birth: April 02, 1932

## 2019-03-30 ENCOUNTER — Ambulatory Visit
Admission: RE | Admit: 2019-03-30 | Discharge: 2019-03-30 | Disposition: A | Discharge status: Home / Self Care | Payer: Medicare Other | Source: Ambulatory Visit | Attending: Family Medicine | Admitting: Family Medicine

## 2019-03-30 DIAGNOSIS — N6315 Unspecified lump in the right breast, overlapping quadrants: Secondary | ICD-10-CM | POA: Diagnosis not present

## 2019-03-30 DIAGNOSIS — N6001 Solitary cyst of right breast: Secondary | ICD-10-CM | POA: Diagnosis not present

## 2019-03-31 ENCOUNTER — Other Ambulatory Visit: Payer: Self-pay | Admitting: Family Medicine

## 2019-03-31 ENCOUNTER — Other Ambulatory Visit: Payer: Self-pay

## 2019-03-31 ENCOUNTER — Ambulatory Visit: Payer: Medicare Other | Admitting: Physical Therapy

## 2019-03-31 ENCOUNTER — Encounter: Payer: Self-pay | Admitting: Physical Therapy

## 2019-03-31 DIAGNOSIS — M545 Low back pain: Secondary | ICD-10-CM | POA: Diagnosis not present

## 2019-03-31 DIAGNOSIS — R296 Repeated falls: Secondary | ICD-10-CM | POA: Diagnosis not present

## 2019-03-31 DIAGNOSIS — R928 Other abnormal and inconclusive findings on diagnostic imaging of breast: Secondary | ICD-10-CM

## 2019-03-31 DIAGNOSIS — G8929 Other chronic pain: Secondary | ICD-10-CM

## 2019-03-31 DIAGNOSIS — R262 Difficulty in walking, not elsewhere classified: Secondary | ICD-10-CM

## 2019-03-31 DIAGNOSIS — N6310 Unspecified lump in the right breast, unspecified quadrant: Secondary | ICD-10-CM

## 2019-03-31 NOTE — Therapy (Signed)
[facility] [address], [place], [address] Phone: [phone]   Fax:  [phone]  Physical Therapy Treatment  Patient Details  Name: Taylor [name] MRN: [id number] Date of Birth: 1932-09-24 No data recorded  Encounter Date: 03/31/2019  PT End of Session - 03/31/19 1435    Visit Number  16    Number of Visits  33    Date for PT Re-Evaluation  04/21/19    Authorization - Visit Number  6    Authorization - Number of Visits  10    PT Start Time  0230    PT Stop Time  0315    PT Time Calculation (min)  45 min    Activity Tolerance  Patient tolerated treatment well    Behavior During Therapy  [facility] for tasks assessed/performed       Past Medical History:  Diagnosis Date  . Abnormal mammogram, unspecified 2013  . Breast screening, unspecified 2013  . Diabetes mellitus without complication ([facility]) [id number]   non insulin dependent  . GERD (gastroesophageal reflux disease)   . Glaucoma 2003  . Gout   . Hyperlipidemia 2008  . Hypertension 1980's  . Lump or mass in breast 01/03/2012   left breast  . Obesity, unspecified 2013  . Osteoporosis   . Shingles 2013  . Special screening for malignant neoplasms, colon 2013    Past Surgical History:  Procedure Laterality Date  . ABDOMINAL HYSTERECTOMY  1958   menorrhagia  . BACK SURGERY    . BREAST BIOPSY Left 01/25/2012  . CATARACT EXTRACTION, BILATERAL Bilateral    1st 06/07/15 and the 2nd 06/21/15  . CHOLECYSTECTOMY    . COLONOSCOPY  2003   [facility]  . CORONARY ARTERY BYPASS GRAFT Left 01/03/2012,01/24/2012   Left FNA, Left breast [facility] bx  . KNEE SURGERY  2009,2011,2012   twice on right and once on left  . KNEE SURGERY    . LIPOMA EXCISION  1998  . [place]  2004  . TONSILLECTOMY     age of 30    There were no vitals filed for this visit.  Subjective Assessment - 03/31/19 1432    Subjective  Patient reports no pain today other than her "usual R knee  pain". Reports min compliance with HEP with no falls since last visit.    Pertinent History  Patinet is a 86 year old female presenting to clinic for LBP. Patient has LBP that bothers her when she stands up for 10-53mins for ADLS. Back pain does not radiate and feels achy. She can only sit without back support for 38mins without pain. Worst pain in the past week 9/10 best: 5/10. Reports she has had about 3 falls in the past year, and is usually falling backwards. Reports she is very unsteady with stepping onto varied surfaces. Ambulates with a hurry cane in her LUE and uses her RUE to furiture walk, and has a RW that she will use when she has someone to put it in her car with her when she goes out. When she gets to a store, she uses cart to help her walk. She lives alone and completes her own cooking, cleaning, but has trouble standing for the time it takes to cook/do dishing. Patient reports she gives herself bird baths in her bathroom because she cannot step over her tub to get into her tub/shower that has a seat in it. She drives and completes her own errands. When  her church was open she enjoyed attending, enjoys playing games on her Ipad, and has friends in the community that come and check on her daily, as well as being apart of the "Are You Okay" program with the [place] police (they call daily and if you do not answer they will send the police there to check on you). No steps to enter 1 story home, has a ramp. Denies sensation deficits.  Pt denies N/V, B&B changes, unexplained weight fluctuation, saddle paresthesia, fever, night sweats, or unrelenting night pain at this time.    Limitations  Sitting;Lifting;Standing;Walking    How long can you sit comfortably?  without support 10-20mins    How long can you stand comfortably?  10-70mins    How long can you walk comfortably?  with cane 60mins    Patient Stated Goals  Walk better       Ther-Ex - Nustep seat setting6 with UE L7, L2 48mins for LE muscle  activation with AAROM spine rotation with no increased pain; SPM over 60 - Alt cone taps with RUE support at treadmill bar and LUE on cane 2x 10 (5 each LE) with difficulty with L wt shift and RLE hip and knee flex for foot raise with good compliance with TC for this, requires minA for foot raise, able to come back to floor without assistance - Abd step out and return 2x 6/8 each LE with unilateral UE support; increased difficulty with LLE step out and in, increased time needed to complete and occasional minA to prevent post LOB - Very mini squat 2x 8 with chair behind for safety, attempting not to touch bar in front, needed often. Demo and multiple cues to prevent post LOB with stand, patient reports fear of falling forward, she is able to correct this well by final reps  Gait Training Inside through clinic and outside down decline >5% grade to parking lot with curb step down, able to complete without HHA, SPC only, mostly supervision for safety                         PT Education - 03/31/19 1434    Education Details  therex form, gait training    Person(s) Educated  Patient    Methods  Explanation;Demonstration;Verbal cues;Tactile cues    Comprehension  Verbalized understanding;Returned demonstration;Verbal cues required;Tactile cues required       PT Short Term Goals - 03/03/19 1456      PT SHORT TERM GOAL #1   Title  Pt will be independent with HEP in order to improve strength and decrease back pain in order to improve pain-free function at home and increase QOL    Baseline  03/03/19 Completing HEP with 50% compliance    Time  4    Period  Weeks    Status  On-going      PT SHORT TERM GOAL #2   Title  Pt will improve BERG by at least 3 points in order to demonstrate clinically significant improvement in balance.    Baseline  03/03/19 31/56    Time  6    Period  Weeks    Status  Achieved      PT SHORT TERM GOAL #3   Title  Pt will increase 10MWT by at least  0.13 m/s in order to demonstrate clinically significant improvement in community ambulation.    Baseline  12/30/18 0.18 m/s    Time  6    Period  Weeks    Status  On-going      PT SHORT TERM GOAL #4   Title  Pt will decrease 5TSTS by at least 3 seconds in order to demonstrate clinically significant improvement in LE strength    Baseline  03/03/19 36sec    Time  6    Period  Weeks    Status  On-going        PT Long Term Goals - 03/03/19 1517      PT LONG TERM GOAL #1   Title  Patient will obtain a score of 46/56 on the BERG to demonstrate decreased fall risk    Baseline  03/02/54 31/56    Time  12    Period  Weeks    Status  On-going      PT LONG TERM GOAL #2   Title  Patient will demonstrate walk speed of 0.8-1.65m/s on 10MWT in order to demonstrate household ambulator walk speed with safety    Baseline  12/30/18 0.44m/s with HHA with SPC, needing min-modA to  Prevent LOB on 4 occasions    Time  12    Period  Weeks    Status  On-going      PT LONG TERM GOAL #3   Title  Patient will complete 5xSTS without UE support in 14.8sec in order to demonstrate age matched strength norms    Baseline  03/03/19 36sec    Time  12    Period  Weeks    Status  On-going      PT LONG TERM GOAL #4   Title  Patient will be able to ambulate 71ft with SPC without LOB/need for external support in order to demonstrate safety walking household distances/into story    Baseline  01/01/20 able to walk 78ft with HHA and SPC, 94ft without RUE touching consistently    Time  12    Period  Weeks    Status  On-going            Plan - 03/31/19 1504    Clinical Impression Statement  PT continued therex progression for LE and core stabilization and balance, with good success. Patient is able to complete all therex with proper techniques with cuing and occassional modifications. Patient with strong need for gaurding for safety, but is able to maintain balance with PT throughout session. PT will continue  progression as able.    Personal Factors and Comorbidities  Age;Fitness;Comorbidity 1;Comorbidity 2;Comorbidity 3+;Past/Current Experience;Sex    Comorbidities  HTN, HLD, GERD, osteoporosis    Examination-Activity Limitations  Bathing;Sit;Dressing;Transfers;Bed Mobility;Bend;Lift;Carry;Reach Overhead;Stand;Stairs    Examination-Participation Restrictions  Church;Laundry;Cleaning;Community Activity;Meal Prep    Stability/Clinical Decision Making  Evolving/Moderate complexity    Clinical Decision Making  Moderate    Rehab Potential  Good    PT Frequency  2x / week    PT Duration  8 weeks    PT Treatment/Interventions  Moist Heat;Traction;Gait training;Stair training;Balance training;Dry needling;Joint Manipulations;Spinal Manipulations;Passive range of motion;Manual techniques;Patient/family education;Therapeutic exercise;Electrical Stimulation;ADLs/Self Care Home Management;Therapeutic activities;Functional mobility training;Energy conservation;Neuromuscular re-education    PT Next Visit Plan  Balance training, LE/core strengthening    PT Home Exercise Plan  STS from chair, seated hip abd, seated marching    Consulted and Agree with Plan of Care  Patient       Patient will benefit from skilled therapeutic intervention in order to improve the following deficits and impairments:  Abnormal gait, Decreased balance, Decreased endurance, Decreased mobility, Difficulty walking, Hypomobility, Cardiopulmonary status limiting activity, Decreased range  of motion, Improper body mechanics, Decreased activity tolerance, Decreased coordination, Decreased safety awareness, Decreased strength, Increased fascial restricitons, Impaired flexibility, Postural dysfunction, Pain  Visit Diagnosis: Chronic midline low back pain without sciatica  Difficulty in walking, not elsewhere classified  Repeated falls     Problem List Patient Active Problem List   Diagnosis Date Noted  . Morbid obesity ([place]) 04/08/2018   . Age-related osteoporosis without current pathological fracture 09/26/2017  . Pain in both hands 09/23/2017  . Hyperparathyroidism ([place]) 08/06/2017  . Chronic kidney disease, stage III (moderate) 08/06/2017  . HLD (hyperlipidemia) 07/25/2017  . Osteoporotic compression fracture of spine ([place]) 03/22/2017  . Assistance needed with transportation 03/21/2017  . Iron deficiency anemia 12/19/2016  . Aortic atherosclerosis ([place]) 11/23/2016  . Anterolisthesis 11/23/2016  . Chronic pain syndrome 09/13/2016  . Vitamin D deficiency 08/19/2016  . Anemia, unspecified 08/19/2016  . Primary open angle glaucoma (POAG) of both eyes, severe stage 08/17/2016  . Hypertensive retinopathy 08/17/2016  . Long term current use of opiate analgesic 07/19/2016  . Dyslipidemia associated with type 2 diabetes mellitus ([place]) 04/16/2016  . Chronic midline low back pain without sciatica 04/16/2016  . Chronic pain of right knee 04/16/2016  . Vaginal dryness 02/22/2015  . Calculus of kidney 11/10/2014  . Osteoarthritis, multiple sites 09/09/2014  . Type 2 diabetes mellitus with microalbuminuria ([place]) 09/09/2014  . Glaucoma 09/09/2014  . Benign hypertension with CKD (chronic kidney disease) stage III ([place]) 09/09/2014  . Chronic pain 09/09/2014  . Tinea corporis 09/09/2014  . Umbilical hernia [id number]  . Lump or mass in breast    Shelton Silvas PT, DPT Shelton Silvas 03/31/2019, 3:12 PM  [facility] [address], [place], [address] Phone: [phone]   Fax:  [phone]  Name: Taylor [name] MRN: [id number] Date of Birth: 08/19/1932

## 2019-04-02 ENCOUNTER — Ambulatory Visit: Payer: Medicare Other | Admitting: Physical Therapy

## 2019-04-03 ENCOUNTER — Other Ambulatory Visit: Payer: Self-pay | Admitting: Family Medicine

## 2019-04-03 DIAGNOSIS — N183 Chronic kidney disease, stage 3 unspecified: Secondary | ICD-10-CM

## 2019-04-03 DIAGNOSIS — I129 Hypertensive chronic kidney disease with stage 1 through stage 4 chronic kidney disease, or unspecified chronic kidney disease: Secondary | ICD-10-CM

## 2019-04-03 DIAGNOSIS — I10 Essential (primary) hypertension: Secondary | ICD-10-CM

## 2019-04-03 DIAGNOSIS — E1322 Other specified diabetes mellitus with diabetic chronic kidney disease: Secondary | ICD-10-CM

## 2019-04-07 ENCOUNTER — Encounter: Payer: Self-pay | Admitting: Physical Therapy

## 2019-04-07 ENCOUNTER — Other Ambulatory Visit: Payer: Self-pay

## 2019-04-07 ENCOUNTER — Ambulatory Visit: Payer: Medicare Other | Admitting: Physical Therapy

## 2019-04-07 DIAGNOSIS — R296 Repeated falls: Secondary | ICD-10-CM

## 2019-04-07 DIAGNOSIS — M545 Low back pain, unspecified: Secondary | ICD-10-CM

## 2019-04-07 DIAGNOSIS — G8929 Other chronic pain: Secondary | ICD-10-CM

## 2019-04-07 DIAGNOSIS — R262 Difficulty in walking, not elsewhere classified: Secondary | ICD-10-CM

## 2019-04-07 NOTE — Therapy (Signed)
[facility] [address] [place], [address] Phone: [phone]   Fax:  [phone]  Physical Therapy Treatment  Patient Details  Name: Taylor [name] MRN: [id number] Date of Birth: 1932/04/26 No data recorded  Encounter Date: 04/07/2019  PT End of Session - 04/07/19 1441    Visit Number  17    Number of Visits  33    Date for PT Re-Evaluation  04/21/19    Authorization - Visit Number  7    Authorization - Number of Visits  10    PT Start Time  0232    PT Stop Time  0315    PT Time Calculation (min)  43 min    Activity Tolerance  Patient tolerated treatment well    Behavior During Therapy  [facility] for tasks assessed/performed       Past Medical History:  Diagnosis Date  . Abnormal mammogram, unspecified 2013  . Breast screening, unspecified 2013  . Diabetes mellitus without complication ([facility]) [id number]   non insulin dependent  . GERD (gastroesophageal reflux disease)   . Glaucoma 2003  . Gout   . Hyperlipidemia 2008  . Hypertension 1980's  . Lump or mass in breast 01/03/2012   left breast  . Obesity, unspecified 2013  . Osteoporosis   . Shingles 2013  . Special screening for malignant neoplasms, colon 2013    Past Surgical History:  Procedure Laterality Date  . ABDOMINAL HYSTERECTOMY  1958   menorrhagia  . BACK SURGERY    . BREAST BIOPSY Left 01/25/2012  . CATARACT EXTRACTION, BILATERAL Bilateral    1st 06/07/15 and the 2nd 06/21/15  . CHOLECYSTECTOMY    . COLONOSCOPY  2003   [facility]  . CORONARY ARTERY BYPASS GRAFT Left 01/03/2012,01/24/2012   Left FNA, Left breast [facility] bx  . KNEE SURGERY  2009,2011,2012   twice on right and once on left  . KNEE SURGERY    . LIPOMA EXCISION  1998  . [place]  2004  . TONSILLECTOMY     age of 86    There were no vitals filed for this visit.  Subjective Assessment - 04/07/19 1438    Subjective  Reports no pain today, but has been unable to complete her  HEP d/t increased R knee pain d/t weather. No falls since last visit    Pertinent History  Patinet is a 86 year old female presenting to clinic for LBP. Patient has LBP that bothers her when she stands up for 10-50mins for ADLS. Back pain does not radiate and feels achy. She can only sit without back support for 70mins without pain. Worst pain in the past week 9/10 best: 5/10. Reports she has had about 3 falls in the past year, and is usually falling backwards. Reports she is very unsteady with stepping onto varied surfaces. Ambulates with a hurry cane in her LUE and uses her RUE to furiture walk, and has a RW that she will use when she has someone to put it in her car with her when she goes out. When she gets to a store, she uses cart to help her walk. She lives alone and completes her own cooking, cleaning, but has trouble standing for the time it takes to cook/do dishing. Patient reports she gives herself bird baths in her bathroom because she cannot step over her tub to get into her tub/shower that has a seat in it. She drives and completes her own errands.  When her church was open she enjoyed attending, enjoys playing games on her Ipad, and has friends in the community that come and check on her daily, as well as being apart of the "Are You Okay" program with the [place] police (they call daily and if you do not answer they will send the police there to check on you). No steps to enter 1 story home, has a ramp. Denies sensation deficits.  Pt denies N/V, B&B changes, unexplained weight fluctuation, saddle paresthesia, fever, night sweats, or unrelenting night pain at this time.    Limitations  Sitting;Lifting;Standing;Walking    How long can you sit comfortably?  without support 10-68mins    How long can you stand comfortably?  10-68mins    How long can you walk comfortably?  with cane 74mins    Patient Stated Goals  Walk better       Ther-Ex - Nustep seat setting6 with UE L7, L2 61mins for LE muscle  activation with AAROM spine rotation with no increased pain; SPM over60 for L2 over 40 with L3 - STS with step forward 2x 6 (alt L foot forward and R foot forward) with increased speed than previous sessions, modI STS CGA only for step forward   Gait Training STS + 72ft amb to cone, negotiate around, walk back and sit down x2 trials with cuing to plant cane and pivot around, which patient is able to comply with until last 1/4 of turn and has difficulty adjusting with momentary minA, increased time, and high gaurd needed to maintain balance, better with this on second trial not needing physical assistance during second trial but increased time needed STS with step over 6in hurdle, with 90d turn out of clinic; heavy cuing and minA needed for initial step, better with subsequent step. PT utilized HHA ant/inferior of patient to aid in forward weight shift  Inside through clinic and outside down decline >5% grade to parking lot with curb step down, able to complete without HHA, SPC only,mostly supervisionfor safety                          PT Education - 04/07/19 1440    Education Details  therex form, gait training    Person(s) Educated  Patient    Methods  Explanation;Demonstration;Tactile cues;Verbal cues    Comprehension  Verbalized understanding;Returned demonstration;Verbal cues required;Tactile cues required       PT Short Term Goals - 03/03/19 1456      PT SHORT TERM GOAL #1   Title  Pt will be independent with HEP in order to improve strength and decrease back pain in order to improve pain-free function at home and increase QOL    Baseline  03/03/19 Completing HEP with 50% compliance    Time  4    Period  Weeks    Status  On-going      PT SHORT TERM GOAL #2   Title  Pt will improve BERG by at least 3 points in order to demonstrate clinically significant improvement in balance.    Baseline  03/03/19 31/56    Time  6    Period  Weeks    Status  Achieved       PT SHORT TERM GOAL #3   Title  Pt will increase 10MWT by at least 0.13 m/s in order to demonstrate clinically significant improvement in community ambulation.    Baseline  12/30/18 0.18 m/s    Time  6  Period  Weeks    Status  On-going      PT SHORT TERM GOAL #4   Title  Pt will decrease 5TSTS by at least 3 seconds in order to demonstrate clinically significant improvement in LE strength    Baseline  03/03/19 36sec    Time  6    Period  Weeks    Status  On-going        PT Long Term Goals - 03/03/19 1517      PT LONG TERM GOAL #1   Title  Patient will obtain a score of 46/56 on the BERG to demonstrate decreased fall risk    Baseline  03/02/54 31/56    Time  12    Period  Weeks    Status  On-going      PT LONG TERM GOAL #2   Title  Patient will demonstrate walk speed of 0.8-1.53m/s on 10MWT in order to demonstrate household ambulator walk speed with safety    Baseline  12/30/18 0.81m/s with HHA with SPC, needing min-modA to  Prevent LOB on 4 occasions    Time  12    Period  Weeks    Status  On-going      PT LONG TERM GOAL #3   Title  Patient will complete 5xSTS without UE support in 14.8sec in order to demonstrate age matched strength norms    Baseline  03/03/19 36sec    Time  12    Period  Weeks    Status  On-going      PT LONG TERM GOAL #4   Title  Patient will be able to ambulate 53ft with SPC without LOB/need for external support in order to demonstrate safety walking household distances/into story    Baseline  01/01/20 able to walk 55ft with HHA and SPC, 58ft without RUE touching consistently    Time  12    Period  Weeks    Status  On-going            Plan - 04/07/19 1522    Clinical Impression Statement  PT continued progression for increased LE and core strengthening with carry over into dynamic gait balance with noted improvements from previous sessions. Patient is increasing confedience with less assistance needed to maintain balance with gait  negotiation, though she does require increased time, hip/ankle strategies, and high gaurd to prevent LOB.    Personal Factors and Comorbidities  Age;Fitness;Comorbidity 1;Comorbidity 2;Comorbidity 3+;Past/Current Experience;Sex    Comorbidities  HTN, HLD, GERD, osteoporosis    Examination-Activity Limitations  Bathing;Sit;Dressing;Transfers;Bed Mobility;Bend;Lift;Carry;Reach Overhead;Stand;Stairs    Examination-Participation Restrictions  Church;Laundry;Cleaning;Community Activity;Meal Prep    Stability/Clinical Decision Making  Evolving/Moderate complexity    Clinical Decision Making  Moderate    Rehab Potential  Good    PT Frequency  2x / week    PT Duration  8 weeks    PT Treatment/Interventions  Moist Heat;Traction;Gait training;Stair training;Balance training;Dry needling;Joint Manipulations;Spinal Manipulations;Passive range of motion;Manual techniques;Patient/family education;Therapeutic exercise;Electrical Stimulation;ADLs/Self Care Home Management;Therapeutic activities;Functional mobility training;Energy conservation;Neuromuscular re-education    PT Next Visit Plan  Balance training, LE/core strengthening    PT Home Exercise Plan  STS from chair, seated hip abd, seated marching    Consulted and Agree with Plan of Care  Patient       Patient will benefit from skilled therapeutic intervention in order to improve the following deficits and impairments:  Abnormal gait, Decreased balance, Decreased endurance, Decreased mobility, Difficulty walking, Hypomobility, Cardiopulmonary status limiting activity, Decreased range of  motion, Improper body mechanics, Decreased activity tolerance, Decreased coordination, Decreased safety awareness, Decreased strength, Increased fascial restricitons, Impaired flexibility, Postural dysfunction, Pain  Visit Diagnosis: Chronic midline low back pain without sciatica  Difficulty in walking, not elsewhere classified  Repeated falls     Problem  List Patient Active Problem List   Diagnosis Date Noted  . Morbid obesity ([place]) 04/08/2018  . Age-related osteoporosis without current pathological fracture 09/26/2017  . Pain in both hands 09/23/2017  . Hyperparathyroidism ([place]) 08/06/2017  . Chronic kidney disease, stage III (moderate) 08/06/2017  . HLD (hyperlipidemia) 07/25/2017  . Osteoporotic compression fracture of spine ([place]) 03/22/2017  . Assistance needed with transportation 03/21/2017  . Iron deficiency anemia 12/19/2016  . Aortic atherosclerosis ([place]) 11/23/2016  . Anterolisthesis 11/23/2016  . Chronic pain syndrome 09/13/2016  . Vitamin D deficiency 08/19/2016  . Anemia, unspecified 08/19/2016  . Primary open angle glaucoma (POAG) of both eyes, severe stage 08/17/2016  . Hypertensive retinopathy 08/17/2016  . Long term current use of opiate analgesic 07/19/2016  . Dyslipidemia associated with type 2 diabetes mellitus ([place]) 04/16/2016  . Chronic midline low back pain without sciatica 04/16/2016  . Chronic pain of right knee 04/16/2016  . Vaginal dryness 02/22/2015  . Calculus of kidney 11/10/2014  . Osteoarthritis, multiple sites 09/09/2014  . Type 2 diabetes mellitus with microalbuminuria ([place]) 09/09/2014  . Glaucoma 09/09/2014  . Benign hypertension with CKD (chronic kidney disease) stage III ([place]) 09/09/2014  . Chronic pain 09/09/2014  . Tinea corporis 09/09/2014  . Umbilical hernia [id number]  . Lump or mass in breast    Shelton Silvas PT, DPT Shelton Silvas 04/07/2019, 3:28 PM  [facility] [address] [place], [address] Phone: [phone]   Fax:  [phone]  Name: LINLEE DORTON MRN: [id number] Date of Birth: July 28, 1932

## 2019-04-08 ENCOUNTER — Ambulatory Visit: Payer: Medicare Other | Admitting: Physical Therapy

## 2019-04-08 ENCOUNTER — Ambulatory Visit
Admission: RE | Admit: 2019-04-08 | Discharge: 2019-04-08 | Disposition: A | Discharge status: Home / Self Care | Payer: Medicare Other | Source: Ambulatory Visit | Attending: Family Medicine | Admitting: Family Medicine

## 2019-04-08 DIAGNOSIS — N6310 Unspecified lump in the right breast, unspecified quadrant: Secondary | ICD-10-CM | POA: Diagnosis not present

## 2019-04-08 DIAGNOSIS — R928 Other abnormal and inconclusive findings on diagnostic imaging of breast: Secondary | ICD-10-CM

## 2019-04-08 DIAGNOSIS — N6313 Unspecified lump in the right breast, lower outer quadrant: Secondary | ICD-10-CM | POA: Diagnosis not present

## 2019-04-08 DIAGNOSIS — D241 Benign neoplasm of right breast: Secondary | ICD-10-CM | POA: Diagnosis not present

## 2019-04-08 HISTORY — PX: BREAST BIOPSY: SHX20

## 2019-04-09 LAB — SURGICAL PATHOLOGY

## 2019-04-14 ENCOUNTER — Ambulatory Visit: Payer: Medicare Other | Admitting: Physical Therapy

## 2019-04-14 ENCOUNTER — Other Ambulatory Visit: Payer: Self-pay

## 2019-04-14 ENCOUNTER — Telehealth: Payer: Self-pay

## 2019-04-14 ENCOUNTER — Other Ambulatory Visit: Payer: Self-pay | Admitting: Family Medicine

## 2019-04-14 ENCOUNTER — Encounter: Payer: Self-pay | Admitting: Physical Therapy

## 2019-04-14 DIAGNOSIS — G8929 Other chronic pain: Secondary | ICD-10-CM

## 2019-04-14 DIAGNOSIS — R262 Difficulty in walking, not elsewhere classified: Secondary | ICD-10-CM

## 2019-04-14 DIAGNOSIS — R296 Repeated falls: Secondary | ICD-10-CM

## 2019-04-14 DIAGNOSIS — E1142 Type 2 diabetes mellitus with diabetic polyneuropathy: Secondary | ICD-10-CM

## 2019-04-14 DIAGNOSIS — M545 Low back pain: Secondary | ICD-10-CM | POA: Diagnosis not present

## 2019-04-14 NOTE — Therapy (Addendum)
[facility] [address] [place], [address] Phone: [phone]   Fax:  [phone]  Physical Therapy Treatment  Patient Details  Name: Taylor [name] MRN: [id number] Date of Birth: 08/10/1932 No data recorded  Encounter Date: 04/14/2019  PT End of Session - 04/14/19 1701    Visit Number  18    Number of Visits  33    Date for PT Re-Evaluation  04/21/19    Authorization - Visit Number  8    Authorization - Number of Visits  10    PT Start Time  [id number]    PT Stop Time  1515    PT Time Calculation (min)  40 min    Equipment Utilized During Treatment  Gait belt    Activity Tolerance  Patient tolerated treatment well    Behavior During Therapy  [facility] for tasks assessed/performed       Past Medical History:  Diagnosis Date  . Abnormal mammogram, unspecified 2013  . Breast screening, unspecified 2013  . Diabetes mellitus without complication ([place]) [id number]   non insulin dependent  . GERD (gastroesophageal reflux disease)   . Glaucoma 2003  . Gout   . Hyperlipidemia 2008  . Hypertension 1980's  . Lump or mass in breast 01/03/2012   left breast  . Obesity, unspecified 2013  . Osteoporosis   . Shingles 2013  . Special screening for malignant neoplasms, colon 2013    Past Surgical History:  Procedure Laterality Date  . ABDOMINAL HYSTERECTOMY  1958   menorrhagia  . BACK SURGERY    . BREAST BIOPSY Left 01/25/2012  . BREAST BIOPSY Right 04/08/2019   hear clip, [place] bx, path pending  . CATARACT EXTRACTION, BILATERAL Bilateral    1st 06/07/15 and the 2nd 06/21/15  . CHOLECYSTECTOMY    . COLONOSCOPY  2003   [facility]  . CORONARY ARTERY BYPASS GRAFT Left 01/03/2012,01/24/2012   Left FNA, Left breast [facility] bx  . KNEE SURGERY  2009,2011,2012   twice on right and once on left  . KNEE SURGERY    . LIPOMA EXCISION  1998  . [place]  2004  . TONSILLECTOMY     age of 74    There were no vitals filed for this  visit.  Subjective Assessment - 04/14/19 1439    Subjective  Reports no pain that is out of the ordinary, but some R knee pain. Pt forgot what home exercises she needs to do, requests PT goes over it today. No falls since last session.    Pertinent History  Patinet is a 86 year old female presenting to clinic for LBP. Patient has LBP that bothers her when she stands up for 10-54mins for ADLS. Back pain does not radiate and feels achy. She can only sit without back support for 58mins without pain. Worst pain in the past week 9/10 best: 5/10. Reports she has had about 3 falls in the past year, and is usually falling backwards. Reports she is very unsteady with stepping onto varied surfaces. Ambulates with a hurry cane in her LUE and uses her RUE to furiture walk, and has a RW that she will use when she has someone to put it in her car with her when she goes out. When she gets to a store, she uses cart to help her walk. She lives alone and completes her own cooking, cleaning, but has trouble standing for the time it takes to cook/do dishing. Patient  reports she gives herself bird baths in her bathroom because she cannot step over her tub to get into her tub/shower that has a seat in it. She drives and completes her own errands. When her church was open she enjoyed attending, enjoys playing games on her Ipad, and has friends in the community that come and check on her daily, as well as being apart of the "Are You Okay" program with the [place] police (they call daily and if you do not answer they will send the police there to check on you). No steps to enter 1 story home, has a ramp. Denies sensation deficits.  Pt denies N/V, B&B changes, unexplained weight fluctuation, saddle paresthesia, fever, night sweats, or unrelenting night pain at this time.    Limitations  Sitting;Lifting;Standing;Walking    How long can you sit comfortably?  without support 10-12mins    How long can you stand comfortably?  10-93mins     How long can you walk comfortably?  with cane 62mins    Patient Stated Goals  Walk better        THEREX -Nustep L2 keeping spm above 55 for 5 mins with some carryover -Lateral step over balance stones 2x3 ea direction using bilat handrail support; heavy cueing for keeping trunk upright, bending knee to step over with decent carryover with standing break and PT CGA -Standing toe taps on stair with one hand support on railing PT CGA 1x6 ea leg with cueing for trunk and hip extension and standing breaks -Standing toe taps 1x6 on balance stone with light touch on PT and utilizing cane for balance; pt hesitant at first, but able to hold PT with light touch to accomplish task  Gait Training  Inside through clinic and outside down decline >5% grade to parking lot with curb step down. PT mostly supervision, but had to provide min assist when pt has 2-3 missteps; pt able to regain balance within 3 seconds on own                     PT Education - 04/14/19 1442    Education Details  therex form, gait training    Person(s) Educated  Patient    Methods  Explanation;Demonstration;Tactile cues    Comprehension  Verbalized understanding       PT Short Term Goals - 03/03/19 1456      PT SHORT TERM GOAL #1   Title  Pt will be independent with HEP in order to improve strength and decrease back pain in order to improve pain-free function at home and increase QOL    Baseline  03/03/19 Completing HEP with 50% compliance    Time  4    Period  Weeks    Status  On-going      PT SHORT TERM GOAL #2   Title  Pt will improve BERG by at least 3 points in order to demonstrate clinically significant improvement in balance.    Baseline  03/03/19 31/56    Time  6    Period  Weeks    Status  Achieved      PT SHORT TERM GOAL #3   Title  Pt will increase 10MWT by at least 0.13 m/s in order to demonstrate clinically significant improvement in community ambulation.    Baseline  12/30/18 0.18 m/s     Time  6    Period  Weeks    Status  On-going      PT SHORT TERM GOAL #  4   Title  Pt will decrease 5TSTS by at least 3 seconds in order to demonstrate clinically significant improvement in LE strength    Baseline  03/03/19 36sec    Time  6    Period  Weeks    Status  On-going        PT Long Term Goals - 03/03/19 1517      PT LONG TERM GOAL #1   Title  Patient will obtain a score of 46/56 on the BERG to demonstrate decreased fall risk    Baseline  03/02/54 31/56    Time  12    Period  Weeks    Status  On-going      PT LONG TERM GOAL #2   Title  Patient will demonstrate walk speed of 0.8-1.13m/s on 10MWT in order to demonstrate household ambulator walk speed with safety    Baseline  12/30/18 0.29m/s with HHA with SPC, needing min-modA to  Prevent LOB on 4 occasions    Time  12    Period  Weeks    Status  On-going      PT LONG TERM GOAL #3   Title  Patient will complete 5xSTS without UE support in 14.8sec in order to demonstrate age matched strength norms    Baseline  03/03/19 36sec    Time  12    Period  Weeks    Status  On-going      PT LONG TERM GOAL #4   Title  Patient will be able to ambulate 82ft with SPC without LOB/need for external support in order to demonstrate safety walking household distances/into story    Baseline  01/01/20 able to walk 4ft with HHA and SPC, 25ft without RUE touching consistently    Time  12    Period  Weeks    Status  On-going            Plan - 04/14/19 1702    Clinical Impression Statement  Pt showed up 5 minutes late, so PT made accomodation. PT increased LE strengthening and added dynamic gait balance with good success from pt. Pt notes improved gait speed and increased confidence from previous session. Pt requires external support for most activities, but is using less support each session. PT will continue with progressions for balance and dynamic gait training as appropriate.    Personal Factors and Comorbidities   Age;Fitness;Comorbidity 1;Comorbidity 2;Comorbidity 3+;Past/Current Experience;Sex    Comorbidities  HTN, HLD, GERD, osteoporosis    Examination-Activity Limitations  Bathing;Sit;Dressing;Transfers;Bed Mobility;Bend;Lift;Carry;Reach Overhead;Stand;Stairs    Examination-Participation Restrictions  Church;Laundry;Cleaning;Community Activity;Meal Prep    Stability/Clinical Decision Making  Evolving/Moderate complexity    Clinical Decision Making  Moderate    Rehab Potential  Good    PT Frequency  2x / week    PT Duration  8 weeks    PT Treatment/Interventions  Moist Heat;Traction;Gait training;Stair training;Balance training;Dry needling;Joint Manipulations;Spinal Manipulations;Passive range of motion;Manual techniques;Patient/family education;Therapeutic exercise;Electrical Stimulation;ADLs/Self Care Home Management;Therapeutic activities;Functional mobility training;Energy conservation;Neuromuscular re-education    PT Next Visit Plan  Balance training, LE/core strengthening    PT Home Exercise Plan  STS from chair, seated hip abd, seated marching    Consulted and Agree with Plan of Care  Patient       Patient will benefit from skilled therapeutic intervention in order to improve the following deficits and impairments:  Abnormal gait, Decreased balance, Decreased endurance, Decreased mobility, Difficulty walking, Hypomobility, Cardiopulmonary status limiting activity, Decreased range of motion, Improper body mechanics, Decreased activity  tolerance, Decreased coordination, Decreased safety awareness, Decreased strength, Increased fascial restricitons, Impaired flexibility, Postural dysfunction, Pain  Visit Diagnosis: Chronic midline low back pain without sciatica  Difficulty in walking, not elsewhere classified  Repeated falls     Problem List Patient Active Problem List   Diagnosis Date Noted  . Morbid obesity ([place]) 04/08/2018  . Age-related osteoporosis without current pathological  fracture 09/26/2017  . Pain in both hands 09/23/2017  . Hyperparathyroidism ([place]) 08/06/2017  . Chronic kidney disease, stage III (moderate) 08/06/2017  . HLD (hyperlipidemia) 07/25/2017  . Osteoporotic compression fracture of spine ([place]) 03/22/2017  . Assistance needed with transportation 03/21/2017  . Iron deficiency anemia 12/19/2016  . Aortic atherosclerosis ([place]) 11/23/2016  . Anterolisthesis 11/23/2016  . Chronic pain syndrome 09/13/2016  . Vitamin D deficiency 08/19/2016  . Anemia, unspecified 08/19/2016  . Primary open angle glaucoma (POAG) of both eyes, severe stage 08/17/2016  . Hypertensive retinopathy 08/17/2016  . Long term current use of opiate analgesic 07/19/2016  . Dyslipidemia associated with type 2 diabetes mellitus ([place]) 04/16/2016  . Chronic midline low back pain without sciatica 04/16/2016  . Chronic pain of right knee 04/16/2016  . Vaginal dryness 02/22/2015  . Calculus of kidney 11/10/2014  . Osteoarthritis, multiple sites 09/09/2014  . Type 2 diabetes mellitus with microalbuminuria ([place]) 09/09/2014  . Glaucoma 09/09/2014  . Benign hypertension with CKD (chronic kidney disease) stage III ([place]) 09/09/2014  . Chronic pain 09/09/2014  . Tinea corporis 09/09/2014  . Umbilical hernia [id number]  . Lump or mass in breast    Shelton Silvas PT, DPT Ivin Booty, SPT Shelton Silvas 04/14/2019, 6:15 PM  [facility] [address] [place], [address] Phone: [phone]   Fax:  [phone]  Name: Taylor [name] MRN: [id number] Date of Birth: [birth year]

## 2019-04-14 NOTE — Telephone Encounter (Signed)
Patient seeing Dr. Hampton Abbot on  04/20/2019 at 2:15pm.

## 2019-04-15 DIAGNOSIS — M810 Age-related osteoporosis without current pathological fracture: Secondary | ICD-10-CM | POA: Diagnosis not present

## 2019-04-15 DIAGNOSIS — E213 Hyperparathyroidism, unspecified: Secondary | ICD-10-CM | POA: Diagnosis not present

## 2019-04-15 DIAGNOSIS — E559 Vitamin D deficiency, unspecified: Secondary | ICD-10-CM | POA: Diagnosis not present

## 2019-04-16 ENCOUNTER — Ambulatory Visit: Payer: Medicare Other | Admitting: Physical Therapy

## 2019-04-16 ENCOUNTER — Other Ambulatory Visit: Payer: Self-pay

## 2019-04-16 ENCOUNTER — Encounter: Payer: Self-pay | Admitting: Physical Therapy

## 2019-04-16 DIAGNOSIS — M545 Low back pain: Secondary | ICD-10-CM | POA: Diagnosis not present

## 2019-04-16 DIAGNOSIS — R296 Repeated falls: Secondary | ICD-10-CM

## 2019-04-16 DIAGNOSIS — R262 Difficulty in walking, not elsewhere classified: Secondary | ICD-10-CM | POA: Diagnosis not present

## 2019-04-16 DIAGNOSIS — G8929 Other chronic pain: Secondary | ICD-10-CM | POA: Diagnosis not present

## 2019-04-16 NOTE — Therapy (Signed)
[facility] [address], [place], [address] Phone: [phone]   Fax:  [phone]  Physical Therapy Treatment  Patient Details  Name: Taylor [name] MRN: [id number] Date of Birth: Dec 27, 1932 No data recorded  Encounter Date: 04/16/2019  PT End of Session - 04/16/19 1612    Visit Number  19    Number of Visits  33    Date for PT Re-Evaluation  04/21/19    Authorization - Visit Number  9    Authorization - Number of Visits  10    PT Start Time  [id number]    PT Stop Time  1555    PT Time Calculation (min)  40 min    Equipment Utilized During Treatment  Gait belt    Activity Tolerance  Patient tolerated treatment well    Behavior During Therapy  [facility] for tasks assessed/performed       Past Medical History:  Diagnosis Date  . Abnormal mammogram, unspecified 2013  . Breast screening, unspecified 2013  . Diabetes mellitus without complication ([place]) [id number]   non insulin dependent  . GERD (gastroesophageal reflux disease)   . Glaucoma 2003  . Gout   . Hyperlipidemia 2008  . Hypertension 1980's  . Lump or mass in breast 01/03/2012   left breast  . Obesity, unspecified 2013  . Osteoporosis   . Shingles 2013  . Special screening for malignant neoplasms, colon 2013    Past Surgical History:  Procedure Laterality Date  . ABDOMINAL HYSTERECTOMY  1958   menorrhagia  . BACK SURGERY    . BREAST BIOPSY Left 01/25/2012  . BREAST BIOPSY Right 04/08/2019   hear clip, [place] bx, path pending  . CATARACT EXTRACTION, BILATERAL Bilateral    1st 06/07/15 and the 2nd 06/21/15  . CHOLECYSTECTOMY    . COLONOSCOPY  2003   [facility]  . CORONARY ARTERY BYPASS GRAFT Left 01/03/2012,01/24/2012   Left FNA, Left breast [facility] bx  . KNEE SURGERY  2009,2011,2012   twice on right and once on left  . KNEE SURGERY    . LIPOMA EXCISION  1998  . [place]  2004  . TONSILLECTOMY     age of 86    There were no vitals filed for this  visit.  Subjective Assessment - 04/16/19 1519    Subjective  Pt does not have any pain today. Pt reports good adherence to HEP with better success. No falls since last session.    Pertinent History  Patinet is a 86 year old female presenting to clinic for LBP. Patient has LBP that bothers her when she stands up for 10-51mins for ADLS. Back pain does not radiate and feels achy. She can only sit without back support for 29mins without pain. Worst pain in the past week 9/10 best: 5/10. Reports she has had about 3 falls in the past year, and is usually falling backwards. Reports she is very unsteady with stepping onto varied surfaces. Ambulates with a hurry cane in her LUE and uses her RUE to furiture walk, and has a RW that she will use when she has someone to put it in her car with her when she goes out. When she gets to a store, she uses cart to help her walk. She lives alone and completes her own cooking, cleaning, but has trouble standing for the time it takes to cook/do dishing. Patient reports she gives herself bird baths in her bathroom because she cannot step  over her tub to get into her tub/shower that has a seat in it. She drives and completes her own errands. When her church was open she enjoyed attending, enjoys playing games on her Ipad, and has friends in the community that come and check on her daily, as well as being apart of the "Are You Okay" program with the [place] police (they call daily and if you do not answer they will send the police there to check on you). No steps to enter 1 story home, has a ramp. Denies sensation deficits.  Pt denies N/V, B&B changes, unexplained weight fluctuation, saddle paresthesia, fever, night sweats, or unrelenting night pain at this time.    Limitations  Sitting;Lifting;Standing;Walking    How long can you sit comfortably?  without support 10-35mins    How long can you stand comfortably?  10-23mins    How long can you walk comfortably?  with cane 87mins     Patient Stated Goals  Walk better        THEREX  -Nustep L2 for 4 mins, L3 for 1 min; use of UE setting 7 keeping spm above 70 consistently for gentle LE strengthening; this is highest pt has been in past few sessions -Step up to green box with cane and handrail support 1x6 leading R foot, 1x6 L foot leading and standing breaks; PT cues for leaning forward and driving up with hip extension  Gait Training -Walking through clinic outside down 5% grade ramp with PT min assist for correcting balance -Weaving in and out of 4 poles over grass and uneven surfaces with PT CGA with occasional UE support ~10 meters -Up 5 stairs using handrail and cane; PT CGA cueing for reciprocal step pattern up stairs with some carryover. PT gentle assistance for R LE knee flexion to progress up stairs; pt took seated rest break at top of staircase -Walking down 10% grade ramp with cane PT CGA, occasional use of handrail, but pt able to correct perturbations without support most of time -Walking down 8% graded ramp with CGA ~8 m, then up 8% grade ~5 m with cognitive tasks PT CGA pt able to self-correct perturbations with 80% of time  -Walking across 0% sidewalk ~10 m, down one step curb with cognitive tasks with PT supervision                       PT Education - 04/16/19 1521    Education Details  therex form, gait training    Person(s) Educated  Patient    Methods  Explanation;Tactile cues    Comprehension  Verbalized understanding;Verbal cues required       PT Short Term Goals - 03/03/19 1456      PT SHORT TERM GOAL #1   Title  Pt will be independent with HEP in order to improve strength and decrease back pain in order to improve pain-free function at home and increase QOL    Baseline  03/03/19 Completing HEP with 50% compliance    Time  4    Period  Weeks    Status  On-going      PT SHORT TERM GOAL #2   Title  Pt will improve BERG by at least 3 points in order to demonstrate  clinically significant improvement in balance.    Baseline  03/03/19 31/56    Time  6    Period  Weeks    Status  Achieved      PT SHORT TERM  GOAL #3   Title  Pt will increase 10MWT by at least 0.13 m/s in order to demonstrate clinically significant improvement in community ambulation.    Baseline  12/30/18 0.18 m/s    Time  6    Period  Weeks    Status  On-going      PT SHORT TERM GOAL #4   Title  Pt will decrease 5TSTS by at least 3 seconds in order to demonstrate clinically significant improvement in LE strength    Baseline  03/03/19 36sec    Time  6    Period  Weeks    Status  On-going        PT Long Term Goals - 03/03/19 1517      PT LONG TERM GOAL #1   Title  Patient will obtain a score of 46/56 on the BERG to demonstrate decreased fall risk    Baseline  03/02/54 31/56    Time  12    Period  Weeks    Status  On-going      PT LONG TERM GOAL #2   Title  Patient will demonstrate walk speed of 0.8-1.59m/s on 10MWT in order to demonstrate household ambulator walk speed with safety    Baseline  12/30/18 0.63m/s with HHA with SPC, needing min-modA to  Prevent LOB on 4 occasions    Time  12    Period  Weeks    Status  On-going      PT LONG TERM GOAL #3   Title  Patient will complete 5xSTS without UE support in 14.8sec in order to demonstrate age matched strength norms    Baseline  03/03/19 36sec    Time  12    Period  Weeks    Status  On-going      PT LONG TERM GOAL #4   Title  Patient will be able to ambulate 7ft with SPC without LOB/need for external support in order to demonstrate safety walking household distances/into story    Baseline  01/01/20 able to walk 23ft with HHA and SPC, 65ft without RUE touching consistently    Time  12    Period  Weeks    Status  On-going            Plan - 04/16/19 1606    Clinical Impression Statement  PT focused on gait training with uneven surfaces. Pt responded well to navigating gait through grassy area while weaving through  4 poles with minimal assistance from PT. Pt was able to perform all of gait training session with one seated break which deomonstrates good endurance. Pt needs continued strength focusing on R LE in order to have power for hip and knee flexion for stair climbing and gait. Pt is making good gains and PT will continue with progressions as needed.    Personal Factors and Comorbidities  Age;Fitness;Comorbidity 1;Comorbidity 2;Comorbidity 3+;Past/Current Experience;Sex    Comorbidities  HTN, HLD, GERD, osteoporosis    Examination-Activity Limitations  Bathing;Sit;Dressing;Transfers;Bed Mobility;Bend;Lift;Carry;Reach Overhead;Stand;Stairs    Examination-Participation Restrictions  Church;Laundry;Cleaning;Community Activity;Meal Prep    Stability/Clinical Decision Making  Evolving/Moderate complexity    Clinical Decision Making  Moderate    Rehab Potential  Good    PT Frequency  2x / week    PT Duration  8 weeks    PT Treatment/Interventions  Moist Heat;Traction;Gait training;Stair training;Balance training;Dry needling;Joint Manipulations;Spinal Manipulations;Passive range of motion;Manual techniques;Patient/family education;Therapeutic exercise;Electrical Stimulation;ADLs/Self Care Home Management;Therapeutic activities;Functional mobility training;Energy conservation;Neuromuscular re-education    PT Next Visit Plan  Balance training, LE/core strengthening    PT Home Exercise Plan  STS from chair, seated hip abd, seated marching    Consulted and Agree with Plan of Care  Patient       Patient will benefit from skilled therapeutic intervention in order to improve the following deficits and impairments:  Abnormal gait, Decreased balance, Decreased endurance, Decreased mobility, Difficulty walking, Hypomobility, Cardiopulmonary status limiting activity, Decreased range of motion, Improper body mechanics, Decreased activity tolerance, Decreased coordination, Decreased safety awareness, Decreased strength,  Increased fascial restricitons, Impaired flexibility, Postural dysfunction, Pain  Visit Diagnosis: Chronic midline low back pain without sciatica  Difficulty in walking, not elsewhere classified  Repeated falls     Problem List Patient Active Problem List   Diagnosis Date Noted  . Morbid obesity ([place]) 04/08/2018  . Age-related osteoporosis without current pathological fracture 09/26/2017  . Pain in both hands 09/23/2017  . Hyperparathyroidism ([place]) 08/06/2017  . Chronic kidney disease, stage III (moderate) 08/06/2017  . HLD (hyperlipidemia) 07/25/2017  . Osteoporotic compression fracture of spine ([place]) 03/22/2017  . Assistance needed with transportation 03/21/2017  . Iron deficiency anemia 12/19/2016  . Aortic atherosclerosis ([place]) 11/23/2016  . Anterolisthesis 11/23/2016  . Chronic pain syndrome 09/13/2016  . Vitamin D deficiency 08/19/2016  . Anemia, unspecified 08/19/2016  . Primary open angle glaucoma (POAG) of both eyes, severe stage 08/17/2016  . Hypertensive retinopathy 08/17/2016  . Long term current use of opiate analgesic 07/19/2016  . Dyslipidemia associated with type 2 diabetes mellitus ([place]) 04/16/2016  . Chronic midline low back pain without sciatica 04/16/2016  . Chronic pain of right knee 04/16/2016  . Vaginal dryness 02/22/2015  . Calculus of kidney 11/10/2014  . Osteoarthritis, multiple sites 09/09/2014  . Type 2 diabetes mellitus with microalbuminuria ([place]) 09/09/2014  . Glaucoma 09/09/2014  . Benign hypertension with CKD (chronic kidney disease) stage III ([place]) 09/09/2014  . Chronic pain 09/09/2014  . Tinea corporis 09/09/2014  . Umbilical hernia [id number]  . Lump or mass in breast    Shelton Silvas PT, DPT Ivin Booty, SPT Shelton Silvas 04/16/2019, 4:40 PM  [facility] [address] [place], [address] Phone: [phone]   Fax:  [phone]  Name: Taylor [name] MRN: [id number] Date of Birth: 01-Jan-1933

## 2019-04-17 ENCOUNTER — Ambulatory Visit (INDEPENDENT_AMBULATORY_CARE_PROVIDER_SITE_OTHER): Payer: Medicare Other | Admitting: Family Medicine

## 2019-04-17 ENCOUNTER — Encounter: Payer: Self-pay | Admitting: Family Medicine

## 2019-04-17 VITALS — BP 130/74 | HR 68 | Temp 97.8°F | Resp 14 | Ht 61.0 in | Wt 183.3 lb

## 2019-04-17 DIAGNOSIS — I10 Essential (primary) hypertension: Secondary | ICD-10-CM | POA: Diagnosis not present

## 2019-04-17 DIAGNOSIS — I700 Atherosclerosis of aorta: Secondary | ICD-10-CM

## 2019-04-17 DIAGNOSIS — M545 Low back pain, unspecified: Secondary | ICD-10-CM

## 2019-04-17 DIAGNOSIS — R809 Proteinuria, unspecified: Secondary | ICD-10-CM

## 2019-04-17 DIAGNOSIS — E213 Hyperparathyroidism, unspecified: Secondary | ICD-10-CM

## 2019-04-17 DIAGNOSIS — G8929 Other chronic pain: Secondary | ICD-10-CM

## 2019-04-17 DIAGNOSIS — E1169 Type 2 diabetes mellitus with other specified complication: Secondary | ICD-10-CM

## 2019-04-17 DIAGNOSIS — E1129 Type 2 diabetes mellitus with other diabetic kidney complication: Secondary | ICD-10-CM | POA: Diagnosis not present

## 2019-04-17 DIAGNOSIS — R634 Abnormal weight loss: Secondary | ICD-10-CM

## 2019-04-17 DIAGNOSIS — K219 Gastro-esophageal reflux disease without esophagitis: Secondary | ICD-10-CM

## 2019-04-17 DIAGNOSIS — N183 Chronic kidney disease, stage 3 unspecified: Secondary | ICD-10-CM

## 2019-04-17 DIAGNOSIS — I129 Hypertensive chronic kidney disease with stage 1 through stage 4 chronic kidney disease, or unspecified chronic kidney disease: Secondary | ICD-10-CM

## 2019-04-17 DIAGNOSIS — E7800 Pure hypercholesterolemia, unspecified: Secondary | ICD-10-CM | POA: Diagnosis not present

## 2019-04-17 DIAGNOSIS — E441 Mild protein-calorie malnutrition: Secondary | ICD-10-CM

## 2019-04-17 DIAGNOSIS — M810 Age-related osteoporosis without current pathological fracture: Secondary | ICD-10-CM

## 2019-04-17 DIAGNOSIS — E785 Hyperlipidemia, unspecified: Secondary | ICD-10-CM

## 2019-04-17 DIAGNOSIS — N1832 Chronic kidney disease, stage 3b: Secondary | ICD-10-CM

## 2019-04-17 DIAGNOSIS — R630 Anorexia: Secondary | ICD-10-CM

## 2019-04-17 LAB — POCT GLYCOSYLATED HEMOGLOBIN (HGB A1C): HbA1c, POC (controlled diabetic range): 6.5 % (ref 0.0–7.0)

## 2019-04-17 MED ORDER — METFORMIN HCL ER 750 MG PO TB24: 750.0000 mg | ORAL_TABLET | Freq: Every day | ORAL | 0 refills | Status: DC

## 2019-04-17 MED ORDER — PRAVASTATIN SODIUM 40 MG PO TABS: 40.0000 mg | ORAL_TABLET | Freq: Every day | ORAL | 1 refills | Status: DC

## 2019-04-17 MED ORDER — OLMESARTAN MEDOXOMIL 20 MG PO TABS: 20.0000 mg | ORAL_TABLET | Freq: Every day | ORAL | 0 refills | Status: DC

## 2019-04-17 NOTE — Progress Notes (Signed)
Name: Taylor [name]   MRN: [id number]    DOB: [birth year]   Date:04/17/2019       Progress Note  Subjective  Chief Complaint  Chief Complaint  Patient presents with  . Medication Refill  . Diabetes    Checks twice weekly  . Hypertension    Denies any symptoms  . Hypothyroidism  . Gastroesophageal Reflux  . Hyperlipidemia    HPI  [id number] glucose is usually in the 150's but she checks it non fasting at times, sometimes 80-90  taking metformin 1500 daily She denies polyphagia, polydipsia or polyuria. Eye exam is due and already has an appointment scheduled.. She has a history of microalbuminuria and is taking ARB, she also has CKI and is under the care of Dr. Candiss Norse. She takes Lovaza and statin for dyslipidemia and denies side effects.  She denies side effects of medication. Hgb1C is at goal today at 6.5%. We will decrease dose of Metformin from 1500 to 750 mg daily   Hearing loss: very difficulty to hear [place], had to repeat same question multiple times, she has her hearing aids again today . I had to remove my mask again.   HTN with CKI stage III: also has secondary hyperparathyroidism, under the care of nephrologist, Dr. Candiss Norse.No pruritis noticed, good urine outputLast GFR was down to 35  History of iron deficiency anemia, weight loss 22 lbs since last year, lack of appetite, asked her if she would like to find out the cause and she said yet, explained it could be gastritis, polyps or even cancer. She wants to do tests and see GI, she has been taking iron supplementation, she was not able to bring back hemoccult stools cards from 2020 but negative in 2019   Chronic pain: she has chronic right knee pain, under the care of pain clinic and uses a walker to assist with ambulation . She also has daily back pain and feels weak on her back. She sees Dr . Andree Elk   GERD: well controlled with Pepcid, no heartburn or indigestion. Denies change in bowel movements   Atherosclerosis  aorta: on statin therapy not on aspirin because of  high risk of falls  . Unchanged   Osteoporosis: seeing Dr. Honor Junes for Prolia and is tolerating it well.She will have repeat bone density soon    Patient Active Problem List   Diagnosis Date Noted  . Morbid obesity ([place]) 04/08/2018  . Age-related osteoporosis without current pathological fracture 09/26/2017  . Pain in both hands 09/23/2017  . Hyperparathyroidism ([place]) 08/06/2017  . Chronic kidney disease, stage III (moderate) 08/06/2017  . HLD (hyperlipidemia) 07/25/2017  . Osteoporotic compression fracture of spine ([place]) 03/22/2017  . Assistance needed with transportation 03/21/2017  . Iron deficiency anemia 12/19/2016  . Aortic atherosclerosis ([place]) 11/23/2016  . Anterolisthesis 11/23/2016  . Chronic pain syndrome 09/13/2016  . Vitamin D deficiency 08/19/2016  . Anemia, unspecified 08/19/2016  . Primary open angle glaucoma (POAG) of both eyes, severe stage 08/17/2016  . Hypertensive retinopathy 08/17/2016  . Long term current use of opiate analgesic 07/19/2016  . Dyslipidemia associated with type 2 diabetes mellitus ([place]) 04/16/2016  . Chronic midline low back pain without sciatica 04/16/2016  . Chronic pain of right knee 04/16/2016  . Vaginal dryness 02/22/2015  . Calculus of kidney 11/10/2014  . Osteoarthritis, multiple sites 09/09/2014  . Type 2 diabetes mellitus with microalbuminuria ([place]) 09/09/2014  . Glaucoma 09/09/2014  . Benign hypertension with CKD (chronic kidney disease) stage III ([place]) 09/09/2014  .  Chronic pain 09/09/2014  . Tinea corporis 09/09/2014  . Umbilical hernia [id number]  . Lump or mass in breast     Past Surgical History:  Procedure Laterality Date  . ABDOMINAL HYSTERECTOMY  1958   menorrhagia  . BACK SURGERY    . BREAST BIOPSY Left 01/25/2012  . BREAST BIOPSY Right 04/08/2019   hear clip, [place] bx, path pending  . CATARACT EXTRACTION, BILATERAL Bilateral    1st 06/07/15 and the 2nd 06/21/15   . CHOLECYSTECTOMY    . COLONOSCOPY  2003   [facility]  . CORONARY ARTERY BYPASS GRAFT Left 01/03/2012,01/24/2012   Left FNA, Left breast [place] bx  . KNEE SURGERY  2009,2011,2012   twice on right and once on left  . KNEE SURGERY    . LIPOMA EXCISION  1998  . [place]  2004  . TONSILLECTOMY     age of 33    Family History  Problem Relation Age of Onset  . Cancer Mother        skin cancer  . Stroke Mother   . Heart disease Father   . Breast cancer Neg Hx     Social History   Tobacco Use  . Smoking status: Never Smoker  . Smokeless tobacco: Never Used  Substance Use Topics  . Alcohol use: No    Alcohol/week: 0.0 standard drinks     Current Outpatient Medications:  .  acetaminophen (TYLENOL) 500 MG tablet, Take 1 tablet (500 mg total) by mouth 3 (three) times daily. (Patient taking differently: Take 500 mg by mouth as needed. ), Disp: 90 tablet, Rfl: 0 .  amLODipine (NORVASC) 10 MG tablet, TAKE 1 TABLET BY MOUTH EVERY DAY, Disp: 90 tablet, Rfl: 1 .  AZOPT 1 % ophthalmic suspension, INSTILL 1 DROP INTO BOTH EYES TWICE A DAY, Disp: , Rfl:  .  bimatoprost (LUMIGAN) 0.01 % SOLN, Place 1 drop into both eyes daily. , Disp: , Rfl:  .  brimonidine-timolol (COMBIGAN) 0.2-0.5 % ophthalmic solution, Place 1 drop into both eyes See admin instructions. 1 drop into both eyes in the morning, then 1 drop into both eyes 8 hours later, Disp: , Rfl:  .  Calcium Carbonate-Vitamin D (OYSTER SHELL CALCIUM 500 + D PO), Take 1 tablet by mouth 2 (two) times a day., Disp: , Rfl:  .  famotidine (PEPCID) 20 MG tablet, TAKE 1 TABLET BY MOUTH EVERY DAY, Disp: 90 tablet, Rfl: 1 .  ferrous sulfate 325 (65 FE) MG tablet, Take 1 tablet (325 mg total) by mouth daily with breakfast., Disp: 90 tablet, Rfl: 1 .  fluticasone (FLONASE) 50 MCG/ACT nasal spray, SPRAY 2 SPRAYS INTO EACH NOSTRIL EVERY DAY (Patient taking differently: PRN), Disp: 16 g, Rfl: 1 .  metFORMIN (GLUCOPHAGE-XR) 750 MG 24 hr tablet, TAKE 2  TABLETS (1,500 MG TOTAL) BY MOUTH DAILY WITH BREAKFAST., Disp: 180 tablet, Rfl: 1 .  olmesartan (BENICAR) 20 MG tablet, TAKE 1 TABLET BY MOUTH EVERY DAY, Disp: 90 tablet, Rfl: 0 .  omega-3 acid ethyl esters (LOVAZA) 1 g capsule, TAKE 1 CAPSULE (1 G TOTAL) BY MOUTH 2 (TWO) TIMES DAILY., Disp: 180 capsule, Rfl: 0 .  OneTouch Delica Lancets [id number] MISC, 1 each by Does not apply route daily., Disp: 100 each, Rfl: 1 .  ONETOUCH ULTRA test strip, USE AS DIRECTED, Disp: 100 strip, Rfl: 12 .  oxyCODONE-acetaminophen (PERCOCET) 7.5-325 MG tablet, Take 1 tablet by mouth every 8 (eight) hours as needed for moderate pain or severe pain., Disp: 90 tablet,  Rfl: 0 .  [START ON 05/03/2019] oxyCODONE-acetaminophen (PERCOCET) 7.5-325 MG tablet, Take 1 tablet by mouth every 8 (eight) hours as needed for moderate pain or severe pain., Disp: 90 tablet, Rfl: 0 .  pravastatin (PRAVACHOL) 40 MG tablet, Take 1 tablet (40 mg total) by mouth daily., Disp: 90 tablet, Rfl: 1 .  Cholecalciferol (VITAMIN D-1000 MAX ST) 1000 UNITS tablet, Take 1,000 Units by mouth daily. Reported on 08/05/2015, Disp: , Rfl:  .  PREMARIN vaginal cream, PLACE 1 APPLICATORFUL VAGINALLY DAILY. (Patient not taking: Reported on 04/17/2019), Disp: 30 g, Rfl: 5  Allergies  Allergen Reactions  . Ace Inhibitors     cough    I personally reviewed active problem list, medication list, allergies, family history, social history, health maintenance with the patient/caregiver today.   ROS  Constitutional: Negative for fever, positive for weight change - loss .  Respiratory: Negative for cough and shortness of breath.   Cardiovascular: Negative for chest pain or palpitations.  Gastrointestinal: Negative for abdominal pain, no bowel changes.  Musculoskeletal: Positive for gait problem and right knee  joint swelling.  Skin: Negative for rash.  Neurological: Negative for dizziness or headache.  No other specific complaints in a complete review of systems  (except as listed in HPI above).  Objective  Vitals:   04/17/19 1345  BP: 130/74  Pulse: 68  Resp: 14  Temp: 97.8 F (36.6 C)  TempSrc: Temporal  SpO2: 94%  Weight: 183 lb 4.8 oz (83.1 kg)  Height: 5\' 1"  (1.549 m)    Body mass index is 34.63 kg/m.  Physical Exam  Constitutional: Patient appears well-developed and well-nourished. o distress.  HEENT: head atraumatic, normocephalic, pupils equal and reactive to light Cardiovascular: Normal rate, regular rhythm and normal heart sounds.  No murmur heard. Trace  BLE edema. Pulmonary/Chest: Effort normal and breath sounds normal. No respiratory distress. Abdominal: Soft.  There is no tenderness. Muscular Skeletal: using her walker Psychiatric: Patient has a normal mood and affect. behavior is normal. Judgment and thought content normal.  Recent Results (from the past 2160 hour(s))  Surgical pathology     Status: None   Collection Time: 04/08/19  1:35 PM  Result Value Ref Range   SURGICAL PATHOLOGY      SURGICAL PATHOLOGY CASE: [id number] PATIENT: [facility] Surgical Pathology Report     Specimen Submitted: A. Breast, right, 9:00; biopsy  Clinical History: Right breast with cystic and solid mass, 1.6 cm, located at 9 o'clock 6 cm from nipple. Papillary lesion, fibroadenoma r/o malignancy. Post-biopsy mammograms show appropriate positioning of the heart-shaped biopsy marking clip at the site of biopsy in the lateral right breast.     DIAGNOSIS: A. BREAST, RIGHT, 9 O'CLOCK; ULTRASOUND-GUIDED CORE BIOPSY: - FIBROEPITHELIAL LESION, SEE COMMENT.  Comment: This appears to be a biphasic lesion with low stromal cellularity and pericanalicular growth pattern. The epithelial component shows usual ductal hyperplasia, columnar cell change, and apocrine metaplasia, without atypia. There is no cytologic atypia or increased mitotic activity in the stromal component. However, the stromal component seems to extend into  the adjacent adipose tissue without a well-defin ed border. Clinical correlation and follow-up imaging to determine size stability are recommended.  GROSS DESCRIPTION: A. Labeled: Ultrasound-guided right breast biopsy at 9 o'clock position and 6 cm from nipple Received: In formalin Time/date in fixative: Tissue procedure time 1:35 PM, tissue put in formalin time 1:35 PM on 04/08/2019 Cold ischemic time: Less than 1 minute Total fixation time: 6 hours 30 minutes Core  pieces: 4 Size: Ranging from 0.8-1.2 cm in length and 0.1 cm in diameter Description: Fibrofatty soft tissue cores Ink color: Black Entirely submitted in 1 cassette.   Final Diagnosis performed by Bryan Lemma, MD.   Electronically signed 04/09/2019 4:01:14PM The electronic signature indicates that the named Attending Pathologist has evaluated the specimen Technical component performed at [facility], [address] [place], [place] [address] Lab: [phone] Dir: Rush Farmer, MD, MMM  Professional component performed at [facility], [facility], [place], [place], [place] [address] Lab: [phone] Dir: Dellia Nims. Rubinas, MD   POCT HgB A1C     Status: Normal   Collection Time: 04/17/19  2:08 PM  Result Value Ref Range   Hemoglobin A1C     HbA1c POC (<> result, manual entry)     HbA1c, POC (prediabetic range)     HbA1c, POC (controlled diabetic range) 6.5 0.0 - 7.0 %      PHQ2/9: Depression screen [facility] 2/9 04/17/2019 12/15/2018 08/13/2018 04/08/2018 12/06/2017  Decreased Interest 0 0 0 0 0  Down, Depressed, Hopeless 0 0 0 0 0  PHQ - 2 Score 0 0 0 0 0  Altered sleeping 0 0 0 - 0  Tired, decreased energy 0 0 1 - 0  Change in appetite 0 0 1 - 0  Feeling bad or failure about yourself  0 0 0 - 0  Trouble concentrating 0 0 0 - 0  Moving slowly or fidgety/restless 0 0 0 - 0  Suicidal thoughts 0 0 0 - 0  PHQ-9 Score 0 0 2 - 0  Difficult doing work/chores Not difficult at all - Not difficult at all  - Not difficult at all  Some recent data might be hidden    phq 9 is negative   Fall Risk: Fall Risk  04/17/2019 12/15/2018 08/13/2018 04/08/2018 03/31/2018  Falls in the past year? 0 0 0 0 0  Comment - - - - -  Number falls in past yr: 0 0 0 0 -  Injury with Fall? 0 0 0 0 -  Risk Factor Category  - - - - -  Risk for fall due to : - - - Impaired balance/gait -  Follow up - - - Falls prevention discussed -    Functional Status Survey: Is the patient deaf or have difficulty hearing?: No Does the patient have difficulty seeing, even when wearing glasses/contacts?: Yes Does the patient have difficulty concentrating, remembering, or making decisions?: No Does the patient have difficulty walking or climbing stairs?: Yes Does the patient have difficulty dressing or bathing?: No Does the patient have difficulty doing errands alone such as visiting a doctor's office or shopping?: Yes    Assessment & Plan  1. Type 2 diabetes mellitus with microalbuminuria, without long-term current use of insulin (HCC)  - POCT HgB A1C - Urine Microalbumin w/creat. ratio - metFORMIN (GLUCOPHAGE-XR) 750 MG 24 hr tablet; Take 1 tablet (750 mg total) by mouth daily with breakfast.  Dispense: 90 tablet; Refill: 0  2. Pure hypercholesterolemia  - pravastatin (PRAVACHOL) 40 MG tablet; Take 1 tablet (40 mg total) by mouth daily.  Dispense: 90 tablet; Refill: 1  3. Essential hypertension  - olmesartan (BENICAR) 20 MG tablet; Take 1 tablet (20 mg total) by mouth daily.  Dispense: 90 tablet; Refill: 0  4. Dyslipidemia associated with type 2 diabetes mellitus ([place])   5. Aortic atherosclerosis (HCC)   6. Age-related osteoporosis without current pathological fracture  Keep follow up with  Dr. Gabriel Carina   7. GERD without esophagitis  - Ambulatory referral to Gastroenterology  8. Hyperparathyroidism ([place])  Seeing Dr. Gabriel Carina   9. Chronic bilateral low back pain without sciatica  Still under the care of Dr.  Andree Elk  10. Mild protein-calorie malnutrition ([place])  - Ambulatory referral to Gastroenterology  11. Lack of appetite   - COMPLETE METABOLIC PANEL WITH GFR - CBC with Differential/Platelet - Iron, TIBC and Ferritin Panel - Sedimentation rate - C-reactive protein - Ambulatory referral to Gastroenterology  12. Recent unintentional weight loss over several months  - TSH - COMPLETE METABOLIC PANEL WITH GFR - CBC with Differential/Platelet - Iron, TIBC and Ferritin Panel - Sedimentation rate  13. Benign hypertension with CKD (chronic kidney disease) stage III   14. Stage 3b chronic kidney disease  Sees nephrologist

## 2019-04-18 LAB — COMPLETE METABOLIC PANEL WITH GFR
AG Ratio: 1.8 (calc) (ref 1.0–2.5)
ALT: 9 U/L (ref 6–29)
AST: 11 U/L (ref 10–35)
Albumin: 4.6 g/dL (ref 3.6–5.1)
Alkaline phosphatase (APISO): 34 U/L — ABNORMAL LOW (ref 37–153)
BUN/Creatinine Ratio: 18 (calc) (ref 6–22)
BUN: 22 mg/dL (ref 7–25)
CO2: 21 mmol/L (ref 20–32)
Calcium: 10 mg/dL (ref 8.6–10.4)
Chloride: 109 mmol/L (ref 98–110)
Creat: 1.21 mg/dL — ABNORMAL HIGH (ref 0.60–0.88)
GFR, Est African American: 48 mL/min/{1.73_m2} — ABNORMAL LOW (ref >=60)
GFR, Est Non African American: 41 mL/min/{1.73_m2} — ABNORMAL LOW (ref >=60)
Globulin: 2.6 g/dL (calc) (ref 1.9–3.7)
Glucose, Bld: 114 mg/dL — ABNORMAL HIGH (ref 65–99)
Potassium: 4.5 mmol/L (ref 3.5–5.3)
Sodium: 140 mmol/L (ref 135–146)
Total Bilirubin: 0.3 mg/dL (ref 0.2–1.2)
Total Protein: 7.2 g/dL (ref 6.1–8.1)

## 2019-04-18 LAB — CBC WITH DIFFERENTIAL/PLATELET
Absolute Monocytes: 650 cells/uL (ref 200–950)
Basophils Absolute: 51 cells/uL (ref 0–200)
Basophils Relative: 0.7 %
Eosinophils Absolute: 124 cells/uL (ref 15–500)
Eosinophils Relative: 1.7 %
HCT: 37.1 % (ref 35.0–45.0)
Hemoglobin: 12.1 g/dL (ref 11.7–15.5)
Lymphs Abs: 2562 cells/uL (ref 850–3900)
MCH: 30 pg (ref 27.0–33.0)
MCHC: 32.6 g/dL (ref 32.0–36.0)
MCV: 92.1 fL (ref 80.0–100.0)
MPV: 10 fL (ref 7.5–12.5)
Monocytes Relative: 8.9 %
Neutro Abs: 3913 cells/uL (ref 1500–7800)
Neutrophils Relative %: 53.6 %
Platelets: 332 10*3/uL (ref 140–400)
RBC: 4.03 10*6/uL (ref 3.80–5.10)
RDW: 13.3 % (ref 11.0–15.0)
Total Lymphocyte: 35.1 %
WBC: 7.3 10*3/uL (ref 3.8–10.8)

## 2019-04-18 LAB — IRON,TIBC AND FERRITIN PANEL
%SAT: 29 % (calc) (ref 16–45)
Ferritin: 31 ng/mL (ref 16–288)
Iron: 83 ug/dL (ref 45–160)
TIBC: 290 mcg/dL (calc) (ref 250–450)

## 2019-04-18 LAB — TSH: TSH: 0.63 mIU/L (ref 0.40–4.50)

## 2019-04-18 LAB — MICROALBUMIN / CREATININE URINE RATIO
Creatinine, Urine: 108 mg/dL (ref 20–275)
Microalb Creat Ratio: 17 mcg/mg creat (ref <30)
Microalb, Ur: 1.8 mg/dL

## 2019-04-18 LAB — C-REACTIVE PROTEIN: CRP: 0.7 mg/L (ref <8.0)

## 2019-04-18 LAB — SEDIMENTATION RATE: Sed Rate: 14 mm/h (ref 0–30)

## 2019-04-20 ENCOUNTER — Other Ambulatory Visit: Payer: Self-pay

## 2019-04-20 ENCOUNTER — Ambulatory Visit (INDEPENDENT_AMBULATORY_CARE_PROVIDER_SITE_OTHER): Payer: Medicare Other | Admitting: Surgery

## 2019-04-20 ENCOUNTER — Encounter: Payer: Self-pay | Admitting: Surgery

## 2019-04-20 VITALS — BP 146/75 | HR 73 | Temp 97.7°F | Resp 14 | Ht 61.0 in | Wt 187.0 lb

## 2019-04-20 DIAGNOSIS — N6310 Unspecified lump in the right breast, unspecified quadrant: Secondary | ICD-10-CM

## 2019-04-20 NOTE — Patient Instructions (Addendum)
We have spoken today about removing a lump in your breast. This will be done on 05/07/19 by Dr. Hampton Abbot at [facility].  Our surgery scheduler will be in contact with you to go over surgery information including your Covid testing date.   We will send for surgical clearance from your Medical doctor.   You will most likely be able to leave the hospital several hours after your surgery. Rarely, a patient needs to stay over night but this is a possibility.  Plan to tenatively be off work for 1-2 weeks following the surgery and may return with approximately 4 more weeks of a lifting restriction, no greater than 15 lbs.    Lumpectomy A lumpectomy is a form of "breast conserving" or "breast preservation" surgery. It may also be referred to as a partial mastectomy. During a lumpectomy, the portion of the breast that contains the cancerous tumor or breast mass (the lump) is removed. Some normal tissue around the lump may also be removed to make sure all of the tumor has been removed.  LET [facility] CARE PROVIDER KNOW ABOUT:  Any allergies you have.  All medicines you are taking, including vitamins, herbs, eye drops, creams, and over-the-counter medicines.  Previous problems you or members of your family have had with the use of anesthetics.  Any blood disorders you have.  Previous surgeries you have had.  Medical conditions you have. RISKS AND COMPLICATIONS Generally, this is a safe procedure. However, problems can occur and include:  Bleeding.  Infection.  Pain.  Temporary swelling.  Change in the shape of the breast, particularly if a large portion is removed. BEFORE THE PROCEDURE  Ask your health care provider about changing or stopping your regular medicines. This is especially important if you are taking diabetes medicines or blood thinners.  Do not eat or drink anything after midnight on the night before the procedure or as directed by your health care provider. Ask your health  care provider if you can take a sip of water with any approved medicines.  On the day of surgery, your health care provider will use a mammogram or ultrasound to locate and mark the tumor in your breast. These markings on your breast will show where the cut (incision) will be made. PROCEDURE   An IV tube will be put into one of your veins.  You may be given medicine to help you relax before the surgery (sedative). You will be given one of the following:  A medicine that numbs the area (local anesthetic).  A medicine that makes you fall asleep (general anesthetic).  Your health care provider will use a kind of electric scalpel that uses heat to minimize bleeding (electrocautery knife).  A curved incision (like a smile or frown) that follows the natural curve of your breast is made, to allow for minimal scarring and better healing.  The tumor will be removed with some of the surrounding tissue. This will be sent to the lab for analysis. Your health care provider may also remove your lymph nodes at this time if needed.  Sometimes, but not always, a rubber tube called a drain will be surgically inserted into your breast area or armpit to collect excess fluid that may accumulate in the space where the tumor was. This drain is connected to a plastic bulb on the outside of your body. This drain creates suction to help remove the fluid.  The incisions will be closed with stitches (sutures).  A bandage may be placed  over the incisions. AFTER THE PROCEDURE  You will be taken to the recovery area.  You will be given medicine for pain.  A small rubber drain may be placed in the breast for 2-3 days to prevent a collection of blood (hematoma) from developing in the breast. You will be given instructions on caring for the drain before you go home.  A pressure bandage (dressing) will be applied for 1-2 days to prevent bleeding. Ask your health care provider how to care for your bandage at home.     This information is not intended to replace advice given to you by your health care provider. Make sure you discuss any questions you have with your health care provider.   Document Released: 03/19/2006 Document Revised: 02/26/2014 Document Reviewed: 07/11/2012 Elsevier Interactive Patient Education [employer].

## 2019-04-21 ENCOUNTER — Ambulatory Visit: Payer: Medicare Other | Admitting: Physical Therapy

## 2019-04-21 ENCOUNTER — Encounter: Payer: Self-pay | Admitting: Surgery

## 2019-04-21 DIAGNOSIS — H401122 Primary open-angle glaucoma, left eye, moderate stage: Secondary | ICD-10-CM | POA: Diagnosis not present

## 2019-04-21 DIAGNOSIS — H35372 Puckering of macula, left eye: Secondary | ICD-10-CM | POA: Diagnosis not present

## 2019-04-21 DIAGNOSIS — Z961 Presence of intraocular lens: Secondary | ICD-10-CM | POA: Diagnosis not present

## 2019-04-21 DIAGNOSIS — H401113 Primary open-angle glaucoma, right eye, severe stage: Secondary | ICD-10-CM | POA: Diagnosis not present

## 2019-04-21 NOTE — Progress Notes (Signed)
04/21/2019  Reason for Visit:  Right breast mass  Referring Provider:  Dr [name], Dr. Ammie Ferrier.  History of Present Illness: Taylor [name] is a 86 y.o. female presenting for surgical evaluation of a right breast mass.  The patient has a history of an abnormal screening mammogram on 09/10/18 with a possible mass on the right breast.  A diagnostic mammogram and U/S of the right breast on 10/15/18 confirmed a 1.3 cm mass in the lateral aspect of the right breast, at 9 o clock.  This was felt to be a cyst and follow up was recommended.  A repeat right breast ultrasound was done on 03/30/19, and this showed the mass being 1.6 cm, now complex cystic and solid in nature.  A biopsy was performed on 04/08/19.  Pathology results showed a fibroepithelial lesion, without atypia or malignancy.  However, the stromal component of the lesion extends into the adjacent adipose tissue without a well-defined border.  Given these findings, there was concern for discordance between pathology and imaging, and surgery was recommended for excision.  The patient reports having very sporadic pain in the right and left breast, without any particular trigger.  She otherwise denies feeling any masses, seeing any skin changes, or having any nipple changes or drainage.  She has a prior left breast biopsy in 2013.  She has been doing outpatient physical therapy and uses a walker today in the office.   Past Medical History: Past Medical History:  Diagnosis Date  . Abnormal mammogram, unspecified 2013  . Breast screening, unspecified 2013  . Diabetes mellitus without complication ([place]) [id number]   non insulin dependent  . GERD (gastroesophageal reflux disease)   . Glaucoma 2003  . Gout   . Hyperlipidemia 2008  . Hypertension 1980's  . Lump or mass in breast 01/03/2012   left breast  . Obesity, unspecified 2013  . Osteoporosis   . Shingles 2013  . Special screening for malignant neoplasms, colon 2013     Past  Surgical History: Past Surgical History:  Procedure Laterality Date  . ABDOMINAL HYSTERECTOMY  1958   menorrhagia  . BACK SURGERY    . BREAST BIOPSY Left 01/25/2012  . BREAST BIOPSY Right 04/08/2019   hear clip, [place] bx, path pending  . CATARACT EXTRACTION, BILATERAL Bilateral    1st 06/07/15 and the 2nd 06/21/15  . CHOLECYSTECTOMY    . COLONOSCOPY  2003   [facility]  . CORONARY ARTERY BYPASS GRAFT Left 01/03/2012,01/24/2012   Left FNA, Left breast [facility] bx  . KNEE SURGERY  2009,2011,2012   twice on right and once on left  . KNEE SURGERY    . LIPOMA EXCISION  1998  . Organ  2004  . TONSILLECTOMY     age of 12    Home Medications: Prior to Admission medications   Medication Sig Start Date End Date Taking? Authorizing Provider  acetaminophen (TYLENOL) 500 MG tablet Take 1 tablet (500 mg total) by mouth 3 (three) times daily. Patient taking differently: Take 500 mg by mouth every 6 (six) hours as needed.  12/12/15  Yes Sowles, Drue Stager, MD  amLODipine (NORVASC) 10 MG tablet TAKE 1 TABLET BY MOUTH EVERY DAY 02/01/19  Yes Sowles, Drue Stager, MD  AZOPT 1 % ophthalmic suspension INSTILL 1 DROP INTO BOTH EYES TWICE A DAY 06/02/18  Yes [provider]  bimatoprost (LUMIGAN) 0.01 % SOLN Place 1 drop into both eyes daily.    Yes [provider]  brimonidine-timolol (COMBIGAN) 0.2-0.5 %  ophthalmic solution Place 1 drop into both eyes See admin instructions. 1 drop into both eyes in the morning, then 1 drop into both eyes 8 hours later   Yes [provider]  Calcium Carbonate-Vitamin D (OYSTER SHELL CALCIUM 500 + D PO) Take 1 tablet by mouth 2 (two) times a day.   Yes [provider]  Cholecalciferol (VITAMIN D-1000 MAX ST) 1000 UNITS tablet Take 1,000 Units by mouth daily. Reported on 08/05/2015   Yes [provider]  famotidine (PEPCID) 20 MG tablet TAKE 1 TABLET BY MOUTH EVERY DAY 02/02/19  Yes Sowles, Drue Stager, MD  ferrous sulfate 325 (65 FE) MG tablet  Take 1 tablet (325 mg total) by mouth daily with breakfast. 12/15/18  Yes Sowles, Drue Stager, MD  fluticasone (FLONASE) 50 MCG/ACT nasal spray SPRAY 2 SPRAYS INTO EACH NOSTRIL EVERY DAY Patient taking differently: 1 spray daily. PRN 05/19/17  Yes Sowles, Drue Stager, MD  metFORMIN (GLUCOPHAGE-XR) 750 MG 24 hr tablet Take 1 tablet (750 mg total) by mouth daily with breakfast. 04/17/19  Yes Sowles, Drue Stager, MD  olmesartan (BENICAR) 20 MG tablet Take 1 tablet (20 mg total) by mouth daily. 04/17/19  Yes Sowles, Drue Stager, MD  omega-3 acid ethyl esters (LOVAZA) 1 g capsule TAKE 1 CAPSULE (1 G TOTAL) BY MOUTH 2 (TWO) TIMES DAILY. 12/31/18  Yes Sowles, Drue Stager, MD  OneTouch Delica Lancets 99991111 MISC 1 each by Does not apply route daily. 10/15/18  Yes Sowles, Drue Stager, MD  [facility] ULTRA test strip USE AS DIRECTED 04/14/19  Yes [name], MD  oxyCODONE-acetaminophen (PERCOCET) 7.5-325 MG tablet Take 1 tablet by mouth every 8 (eight) hours as needed for moderate pain or severe pain. 04/04/19 05/04/19 Yes [name], MD  oxyCODONE-acetaminophen (PERCOCET) 7.5-325 MG tablet Take 1 tablet by mouth every 8 (eight) hours as needed for moderate pain or severe pain. 05/03/19 06/02/19 Yes [name], MD  pravastatin (PRAVACHOL) 40 MG tablet Take 1 tablet (40 mg total) by mouth daily. 04/17/19  Yes [name], MD    Allergies: Allergies  Allergen Reactions  . Ace Inhibitors     cough    Social History:  reports that she has never smoked. She has never used smokeless tobacco. She reports that she does not drink alcohol or use drugs.   Family History: Family History  Problem Relation Age of Onset  . Cancer Mother        skin cancer  . Stroke Mother   . Heart disease Father   . Breast cancer Neg Hx     Review of Systems: Review of Systems  Constitutional: Negative for chills and fever.  HENT: Negative for congestion.   Respiratory: Negative for shortness of breath.   Cardiovascular: Negative for chest  pain.  Gastrointestinal: Negative for abdominal pain, nausea and vomiting.  Genitourinary: Negative for dysuria.  Musculoskeletal: Positive for back pain.  Skin: Negative for rash.  Neurological: Negative for dizziness.  Psychiatric/Behavioral: Negative for depression.    Physical Exam BP (!) 146/75   Pulse 73   Temp 97.7 F (36.5 C)   Resp 14   Ht 5\' 1"  (1.549 m)   Wt 187 lb (84.8 kg)   SpO2 99%   BMI 35.33 kg/m  CONSTITUTIONAL: No acute distress HEENT:  Normocephalic, atraumatic, extraocular motion intact. NECK: Trachea is midline, and there is no jugular venous distension.  RESPIRATORY:  Lungs are clear, and breath sounds are equal bilaterally. Normal respiratory effort without pathologic use of accessory muscles. CARDIOVASCULAR: Heart is  regular without murmurs, gallops, or rubs. BREAST:  Right breast with a small area of ecchymosis from biopsy in the lateral aspect of the right breast, 9 o clock.  There is a small palpable mass in this area, sometimes difficult to palpate.  No other palpable masses, skin changes, or nipple changes.  No right axillary or supraclavicular lymphadenopathy.  Left breast without any palpable masses, skin changes, or nipple changes.  No left axillary or supraclavicular lymphadenopathy. GI: The abdomen is soft, non-distended, non-tender.  MUSCULOSKELETAL:  Normal muscle strength and tone in all four extremities.  No peripheral edema or cyanosis. SKIN: Skin turgor is normal. There are no pathologic skin lesions.  NEUROLOGIC:  Motor and sensation is grossly normal.  Cranial nerves are grossly intact. PSYCH:  Alert and oriented to person, place and time. Affect is normal.  Laboratory Analysis: Pathology 04/08/19: DIAGNOSIS:  A. BREAST, RIGHT, 9 O'CLOCK; ULTRASOUND-GUIDED CORE BIOPSY:  - FIBROEPITHELIAL LESION, SEE COMMENT.   Comment:  This appears to be a biphasic lesion with low stromal cellularity and  pericanalicular growth pattern. The epithelial  component shows usual  ductal hyperplasia, columnar cell change, and apocrine metaplasia,  without atypia. There is no cytologic atypia or increased mitotic activity in the stromal component. However, the stromal component seems to extend into the adjacent adipose tissue without a well-defined border. Clinical correlation and follow-up imaging to determine size  stability are recommended.   Imaging: Mammogram and U/S 10/15/18: FINDINGS: Additional imaging of the right breast was performed. There is persistence of a 1.3 cm mass in the lateral aspect of the breast.  Mammographic images were processed with CAD.  On physical exam, no mass is palpated in the lateral aspect of the right breast.  Targeted ultrasound is performed, showing a near anechoic lobulated mass with increased through transmission at 9 o'clock 6 cm from the nipple measuring 1.6 x 0.5 x 0.9 cm most likely a cyst. Sonographic evaluation of the axilla does not show any enlarged adenopathy.  IMPRESSION: Probable cyst in the right breast at 9 o'clock 6 cm from the nipple.  RECOMMENDATION: Short-term interval follow-up ultrasound in 6 months is recommended.  U/S 03/30/19: FINDINGS: Targeted ultrasound is performed, showing a complex cystic and solid mass in the RIGHT breast at the 9 o'clock axis, 6 cm from the nipple, central portion with suspicious internal vascularity, overall measuring 1.6 cm greatest dimension.  RIGHT axilla was evaluated with ultrasound showing no enlarged or morphologically abnormal lymph nodes.  IMPRESSION: Complex cystic and solid mass in the RIGHT breast at the 9 o'clock axis, 6 cm from the nipple, overall measuring 1.6 cm greatest dimension, central portion with suspicious internal vascularity. Recommend ultrasound-guided biopsy with targeting of the central portion that demonstrates internal vascularity.  RECOMMENDATION: Ultrasound-guided biopsy of the complex cystic and solid mass,  with targeting of the solid-appearing central component that demonstrates internal vascularity.   Assessment and Plan: This is a 86 y.o. female with a right breast mass with discordant findings.  Discussed with the patient that at this point, there is no indication of malignancy, but the findings on pathology are suspicious enough that surgical excision would be recommended to make sure there is truly no malignancy.  She is in agreement.  Discussed with her the role for a wire-localized right lumpectomy.  Though the mass is somewhat palpable, I would rather be cautious and use wire-localization as a precaution to make sure excision is accurate.  Discussed with her the risks of bleeding, infection, injury  to surrounding structures, post-op recovery and restrictions.  Will tentatively schedule the patient for 05/07/19.  Will also send medical clearance form to her PCP.  Patient also understands that she would need to be tested for COVID-19 prior to surgery.  Face-to-face time spent with the patient and care providers was 60 minutes, with more than 50% of the time spent counseling, educating, and coordinating care of the patient.     Melvyn Neth, [facility]

## 2019-04-21 NOTE — H&P (View-Only) (Signed)
04/21/2019  Reason for Visit:  Right breast mass  Referring Provider:  Dr [name], Dr. Ammie Ferrier.  History of Present Illness: Taylor [name] is a 86 y.o. female presenting for surgical evaluation of a right breast mass.  The patient has a history of an abnormal screening mammogram on 09/10/18 with a possible mass on the right breast.  A diagnostic mammogram and U/S of the right breast on 10/15/18 confirmed a 1.3 cm mass in the lateral aspect of the right breast, at 9 o clock.  This was felt to be a cyst and follow up was recommended.  A repeat right breast ultrasound was done on 03/30/19, and this showed the mass being 1.6 cm, now complex cystic and solid in nature.  A biopsy was performed on 04/08/19.  Pathology results showed a fibroepithelial lesion, without atypia or malignancy.  However, the stromal component of the lesion extends into the adjacent adipose tissue without a well-defined border.  Given these findings, there was concern for discordance between pathology and imaging, and surgery was recommended for excision.  The patient reports having very sporadic pain in the right and left breast, without any particular trigger.  She otherwise denies feeling any masses, seeing any skin changes, or having any nipple changes or drainage.  She has a prior left breast biopsy in 2013.  She has been doing outpatient physical therapy and uses a walker today in the office.   Past Medical History: Past Medical History:  Diagnosis Date  . Abnormal mammogram, unspecified 2013  . Breast screening, unspecified 2013  . Diabetes mellitus without complication ([place]) [id number]   non insulin dependent  . GERD (gastroesophageal reflux disease)   . Glaucoma 2003  . Gout   . Hyperlipidemia 2008  . Hypertension 1980's  . Lump or mass in breast 01/03/2012   left breast  . Obesity, unspecified 2013  . Osteoporosis   . Shingles 2013  . Special screening for malignant neoplasms, colon 2013     Past  Surgical History: Past Surgical History:  Procedure Laterality Date  . ABDOMINAL HYSTERECTOMY  1958   menorrhagia  . BACK SURGERY    . BREAST BIOPSY Left 01/25/2012  . BREAST BIOPSY Right 04/08/2019   hear clip, [place] bx, path pending  . CATARACT EXTRACTION, BILATERAL Bilateral    1st 06/07/15 and the 2nd 06/21/15  . CHOLECYSTECTOMY    . COLONOSCOPY  2003   [facility]  . CORONARY ARTERY BYPASS GRAFT Left 01/03/2012,01/24/2012   Left FNA, Left breast [facility] bx  . KNEE SURGERY  2009,2011,2012   twice on right and once on left  . KNEE SURGERY    . LIPOMA EXCISION  1998  . [place]  2004  . TONSILLECTOMY     age of 17    Home Medications: Prior to Admission medications   Medication Sig Start Date End Date Taking? Authorizing Provider  acetaminophen (TYLENOL) 500 MG tablet Take 1 tablet (500 mg total) by mouth 3 (three) times daily. Patient taking differently: Take 500 mg by mouth every 6 (six) hours as needed.  12/12/15  Yes Sowles, Drue Stager, MD  amLODipine (NORVASC) 10 MG tablet TAKE 1 TABLET BY MOUTH EVERY DAY 02/01/19  Yes Sowles, Drue Stager, MD  AZOPT 1 % ophthalmic suspension INSTILL 1 DROP INTO BOTH EYES TWICE A DAY 06/02/18  Yes [provider]  bimatoprost (LUMIGAN) 0.01 % SOLN Place 1 drop into both eyes daily.    Yes [provider]  brimonidine-timolol (COMBIGAN) 0.2-0.5 %  ophthalmic solution Place 1 drop into both eyes See admin instructions. 1 drop into both eyes in the morning, then 1 drop into both eyes 8 hours later   Yes [provider]  Calcium Carbonate-Vitamin D (OYSTER SHELL CALCIUM 500 + D PO) Take 1 tablet by mouth 2 (two) times a day.   Yes [provider]  Cholecalciferol (VITAMIN D-1000 MAX ST) 1000 UNITS tablet Take 1,000 Units by mouth daily. Reported on 08/05/2015   Yes [provider]  famotidine (PEPCID) 20 MG tablet TAKE 1 TABLET BY MOUTH EVERY DAY 02/02/19  Yes Sowles, Drue Stager, MD  ferrous sulfate 325 (65 FE) MG tablet  Take 1 tablet (325 mg total) by mouth daily with breakfast. 12/15/18  Yes Sowles, Drue Stager, MD  fluticasone (FLONASE) 50 MCG/ACT nasal spray SPRAY 2 SPRAYS INTO EACH NOSTRIL EVERY DAY Patient taking differently: 1 spray daily. PRN 05/19/17  Yes Sowles, Drue Stager, MD  metFORMIN (GLUCOPHAGE-XR) 750 MG 24 hr tablet Take 1 tablet (750 mg total) by mouth daily with breakfast. 04/17/19  Yes Sowles, Drue Stager, MD  olmesartan (BENICAR) 20 MG tablet Take 1 tablet (20 mg total) by mouth daily. 04/17/19  Yes Sowles, Drue Stager, MD  omega-3 acid ethyl esters (LOVAZA) 1 g capsule TAKE 1 CAPSULE (1 G TOTAL) BY MOUTH 2 (TWO) TIMES DAILY. 12/31/18  Yes Sowles, Drue Stager, MD  OneTouch Delica Lancets 99991111 MISC 1 each by Does not apply route daily. 10/15/18  Yes Sowles, Drue Stager, MD  [facility] ULTRA test strip USE AS DIRECTED 04/14/19  Yes [name], MD  oxyCODONE-acetaminophen (PERCOCET) 7.5-325 MG tablet Take 1 tablet by mouth every 8 (eight) hours as needed for moderate pain or severe pain. 04/04/19 05/04/19 Yes [name], MD  oxyCODONE-acetaminophen (PERCOCET) 7.5-325 MG tablet Take 1 tablet by mouth every 8 (eight) hours as needed for moderate pain or severe pain. 05/03/19 06/02/19 Yes [name], MD  pravastatin (PRAVACHOL) 40 MG tablet Take 1 tablet (40 mg total) by mouth daily. 04/17/19  Yes [name], MD    Allergies: Allergies  Allergen Reactions  . Ace Inhibitors     cough    Social History:  reports that she has never smoked. She has never used smokeless tobacco. She reports that she does not drink alcohol or use drugs.   Family History: Family History  Problem Relation Age of Onset  . Cancer Mother        skin cancer  . Stroke Mother   . Heart disease Father   . Breast cancer Neg Hx     Review of Systems: Review of Systems  Constitutional: Negative for chills and fever.  HENT: Negative for congestion.   Respiratory: Negative for shortness of breath.   Cardiovascular: Negative for chest  pain.  Gastrointestinal: Negative for abdominal pain, nausea and vomiting.  Genitourinary: Negative for dysuria.  Musculoskeletal: Positive for back pain.  Skin: Negative for rash.  Neurological: Negative for dizziness.  Psychiatric/Behavioral: Negative for depression.    Physical Exam BP (!) 146/75   Pulse 73   Temp 97.7 F (36.5 C)   Resp 14   Ht 5\' 1"  (1.549 m)   Wt 187 lb (84.8 kg)   SpO2 99%   BMI 35.33 kg/m  CONSTITUTIONAL: No acute distress HEENT:  Normocephalic, atraumatic, extraocular motion intact. NECK: Trachea is midline, and there is no jugular venous distension.  RESPIRATORY:  Lungs are clear, and breath sounds are equal bilaterally. Normal respiratory effort without pathologic use of accessory muscles. CARDIOVASCULAR: Heart is  regular without murmurs, gallops, or rubs. BREAST:  Right breast with a small area of ecchymosis from biopsy in the lateral aspect of the right breast, 9 o clock.  There is a small palpable mass in this area, sometimes difficult to palpate.  No other palpable masses, skin changes, or nipple changes.  No right axillary or supraclavicular lymphadenopathy.  Left breast without any palpable masses, skin changes, or nipple changes.  No left axillary or supraclavicular lymphadenopathy. GI: The abdomen is soft, non-distended, non-tender.  MUSCULOSKELETAL:  Normal muscle strength and tone in all four extremities.  No peripheral edema or cyanosis. SKIN: Skin turgor is normal. There are no pathologic skin lesions.  NEUROLOGIC:  Motor and sensation is grossly normal.  Cranial nerves are grossly intact. PSYCH:  Alert and oriented to person, place and time. Affect is normal.  Laboratory Analysis: Pathology 04/08/19: DIAGNOSIS:  A. BREAST, RIGHT, 9 O'CLOCK; ULTRASOUND-GUIDED CORE BIOPSY:  - FIBROEPITHELIAL LESION, SEE COMMENT.   Comment:  This appears to be a biphasic lesion with low stromal cellularity and  pericanalicular growth pattern. The epithelial  component shows usual  ductal hyperplasia, columnar cell change, and apocrine metaplasia,  without atypia. There is no cytologic atypia or increased mitotic activity in the stromal component. However, the stromal component seems to extend into the adjacent adipose tissue without a well-defined border. Clinical correlation and follow-up imaging to determine size  stability are recommended.   Imaging: Mammogram and U/S 10/15/18: FINDINGS: Additional imaging of the right breast was performed. There is persistence of a 1.3 cm mass in the lateral aspect of the breast.  Mammographic images were processed with CAD.  On physical exam, no mass is palpated in the lateral aspect of the right breast.  Targeted ultrasound is performed, showing a near anechoic lobulated mass with increased through transmission at 9 o'clock 6 cm from the nipple measuring 1.6 x 0.5 x 0.9 cm most likely a cyst. Sonographic evaluation of the axilla does not show any enlarged adenopathy.  IMPRESSION: Probable cyst in the right breast at 9 o'clock 6 cm from the nipple.  RECOMMENDATION: Short-term interval follow-up ultrasound in 6 months is recommended.  U/S 03/30/19: FINDINGS: Targeted ultrasound is performed, showing a complex cystic and solid mass in the RIGHT breast at the 9 o'clock axis, 6 cm from the nipple, central portion with suspicious internal vascularity, overall measuring 1.6 cm greatest dimension.  RIGHT axilla was evaluated with ultrasound showing no enlarged or morphologically abnormal lymph nodes.  IMPRESSION: Complex cystic and solid mass in the RIGHT breast at the 9 o'clock axis, 6 cm from the nipple, overall measuring 1.6 cm greatest dimension, central portion with suspicious internal vascularity. Recommend ultrasound-guided biopsy with targeting of the central portion that demonstrates internal vascularity.  RECOMMENDATION: Ultrasound-guided biopsy of the complex cystic and solid mass,  with targeting of the solid-appearing central component that demonstrates internal vascularity.   Assessment and Plan: This is a 86 y.o. female with a right breast mass with discordant findings.  Discussed with the patient that at this point, there is no indication of malignancy, but the findings on pathology are suspicious enough that surgical excision would be recommended to make sure there is truly no malignancy.  She is in agreement.  Discussed with her the role for a wire-localized right lumpectomy.  Though the mass is somewhat palpable, I would rather be cautious and use wire-localization as a precaution to make sure excision is accurate.  Discussed with her the risks of bleeding, infection, injury  to surrounding structures, post-op recovery and restrictions.  Will tentatively schedule the patient for 05/07/19.  Will also send medical clearance form to her PCP.  Patient also understands that she would need to be tested for COVID-19 prior to surgery.  Face-to-face time spent with the patient and care providers was 60 minutes, with more than 50% of the time spent counseling, educating, and coordinating care of the patient.     Melvyn Neth, [facility]

## 2019-04-22 ENCOUNTER — Other Ambulatory Visit: Payer: Self-pay | Admitting: Surgery

## 2019-04-22 ENCOUNTER — Telehealth: Payer: Self-pay | Admitting: Family Medicine

## 2019-04-22 NOTE — Telephone Encounter (Signed)
Patient called and she was read lab note by Dr Ancil Boozer 04/19/19. Per previous notation patient had already been read this message. She verbalized understanding of all information and is still waiting for results to be mailed to her.

## 2019-04-23 ENCOUNTER — Other Ambulatory Visit: Payer: Self-pay

## 2019-04-23 ENCOUNTER — Ambulatory Visit: Payer: Medicare Other | Attending: Family Medicine | Admitting: Physical Therapy

## 2019-04-23 ENCOUNTER — Encounter: Payer: Self-pay | Admitting: Physical Therapy

## 2019-04-23 DIAGNOSIS — R262 Difficulty in walking, not elsewhere classified: Secondary | ICD-10-CM | POA: Diagnosis not present

## 2019-04-23 DIAGNOSIS — R296 Repeated falls: Secondary | ICD-10-CM | POA: Diagnosis not present

## 2019-04-23 DIAGNOSIS — G8929 Other chronic pain: Secondary | ICD-10-CM

## 2019-04-23 DIAGNOSIS — M545 Low back pain, unspecified: Secondary | ICD-10-CM

## 2019-04-23 NOTE — Therapy (Signed)
[facility] [address], [place], [address] Phone: [phone]   Fax:  [phone]  Physical Therapy Treatment/Progress Note Reporting Period 03/03/19 - 04/23/19  Patient Details  Name: Taylor [name] MRN: [id number] Date of Birth: 29-Jul-1932 No data recorded  Encounter Date: 04/23/2019  PT End of Session - 04/23/19 1528    Visit Number  20    Number of Visits  49    Date for PT Re-Evaluation  06/18/19    Authorization - Visit Number  10    Authorization - Number of Visits  10    PT Start Time  0238    PT Stop Time  0316    PT Time Calculation (min)  38 min    Activity Tolerance  Patient tolerated treatment well    Behavior During Therapy  [facility] for tasks assessed/performed       Past Medical History:  Diagnosis Date  . Abnormal mammogram, unspecified 2013  . Breast screening, unspecified 2013  . Diabetes mellitus without complication ([place]) [id number]   non insulin dependent  . GERD (gastroesophageal reflux disease)   . Glaucoma 2003  . Gout   . Hyperlipidemia 2008  . Hypertension 1980's  . Lump or mass in breast 01/03/2012   left breast  . Obesity, unspecified 2013  . Osteoporosis   . Shingles 2013  . Special screening for malignant neoplasms, colon 2013    Past Surgical History:  Procedure Laterality Date  . ABDOMINAL HYSTERECTOMY  1958   menorrhagia  . BACK SURGERY    . BREAST BIOPSY Left 01/25/2012  . BREAST BIOPSY Right 04/08/2019   hear clip, [place] bx, path pending  . CATARACT EXTRACTION, BILATERAL Bilateral    1st 06/07/15 and the 2nd 06/21/15  . CHOLECYSTECTOMY    . COLONOSCOPY  2003   [facility]  . CORONARY ARTERY BYPASS GRAFT Left 01/03/2012,01/24/2012   Left FNA, Left breast Encore bx  . KNEE SURGERY  2009,2011,2012   twice on right and once on left  . KNEE SURGERY    . LIPOMA EXCISION  1998  . [place]  2004  . TONSILLECTOMY     age of 74    There were no vitals filed for this  visit.    Ther-Ex 5xSTS x2 trials best time 20sec, unable to complete without use of hands Balance exercises through BERG assessment, with CGA to minA needed for safety, most difficulty with: SLS, stool taps, tandem/semitandem, and rotations static and dynamic  Gait Training Amb over 159ft with SPC, CGA and encouragement not to use RUE to "feel" environment Outside down decline >5% grade to parking lot with curb step down, able to complete without HHA, SPC only,mostly supervisionfor safety                        PT Short Term Goals - 04/23/19 1443      PT SHORT TERM GOAL #1   Title  Pt will be independent with HEP in order to improve strength and decrease back pain in order to improve pain-free function at home and increase QOL    Baseline  04/23/19 75% compliance subjectively reported from patient 03/03/19 Completing HEP with 50% compliance    Time  4    Period  Weeks    Status  On-going      PT SHORT TERM GOAL #2   Title  Pt will improve BERG by at least 3 points  in order to demonstrate clinically significant improvement in balance.    Baseline  03/03/19 31/56    Time  6    Period  Weeks    Status  Achieved      PT SHORT TERM GOAL #3   Title  Pt will increase 10MWT by at least 0.13 m/s in order to demonstrate clinically significant improvement in community ambulation.    Baseline  04/23/19 0.18m/s 12/30/18 0.18 m/s    Time  6    Period  Weeks    Status  Achieved      PT SHORT TERM GOAL #4   Title  Pt will decrease 5TSTS by at least 3 seconds in order to demonstrate clinically significant improvement in LE strength    Baseline  04/23/19 20sec1/12/21 36sec    Time  6    Period  Weeks    Status  Achieved        PT Long Term Goals - 04/23/19 1450      PT LONG TERM GOAL #1   Title  Patient will obtain a score of 46/56 on the BERG to demonstrate decreased fall risk    Baseline  04/23/19 33/56 03/02/54 31/56    Time  12    Period  Weeks    Status  On-going       PT LONG TERM GOAL #2   Title  Patient will demonstrate walk speed of 0.8-1.56m/s on 10MWT in order to demonstrate household ambulator walk speed with safety    Baseline  04/23/19 0.16m/s with CGA and [facility] 12/30/18 0.34m/s with HHA with SPC, needing min-modA to  Prevent LOB on 4 occasions    Time  12    Period  Weeks    Status  On-going      PT LONG TERM GOAL #3   Title  Patient will complete 5xSTS without UE support in 14.8sec in order to demonstrate age matched strength norms    Baseline  04/23/19 20sec 03/03/19 36sec    Time  12    Period  Weeks    Status  On-going      PT LONG TERM GOAL #4   Title  Patient will be able to ambulate 275ft with SPC without LOB/need for external support, and without instances of LOB, in order to demonstrate safety walking household distances/into story    Baseline  04/23/19 185ft with SPC and CGA with high gaurd with RUE, 1 instance of incresaed time needed to re-establish balance.    Time  12    Period  Weeks    Status  Revised            Plan - 04/23/19 1530    Clinical Impression Statement  PT reassessed goals this session for medicare and cert compliance with patient demonstrating good progress toward goals. Patient has doubled gait speed and is able to ambulate with SPC and CGA (previously HHA needed) and utilizes hip and stepping strategies to regain balance, though she does need CGA for safety and takes increased time to re-estabilish balance following swaying. Patient has increased walking distance, but still not to community ambulation distance. Patient has increased BERG balance score, but remains in high fall risk range. Pt is also progressing toward community ambulator walk speed. Patient will continue to benefit from skilled physical therapy to continue progression toward goals, increase safety, and independence.    Personal Factors and Comorbidities  Age;Fitness;Comorbidity 1;Comorbidity 2;Comorbidity 3+;Past/Current Experience;Sex     Comorbidities  HTN, HLD, GERD,  osteoporosis    Examination-Activity Limitations  Bathing;Sit;Dressing;Transfers;Bed Mobility;Bend;Lift;Carry;Reach Overhead;Stand;Stairs    Examination-Participation Restrictions  Church;Laundry;Cleaning;Community Activity;Meal Prep    Stability/Clinical Decision Making  Evolving/Moderate complexity    Clinical Decision Making  Moderate    Rehab Potential  Good    PT Frequency  2x / week    PT Duration  8 weeks    PT Treatment/Interventions  Moist Heat;Traction;Gait training;Stair training;Balance training;Dry needling;Joint Manipulations;Spinal Manipulations;Passive range of motion;Manual techniques;Patient/family education;Therapeutic exercise;Electrical Stimulation;ADLs/Self Care Home Management;Therapeutic activities;Functional mobility training;Energy conservation;Neuromuscular re-education    PT Next Visit Plan  Balance training, LE/core strengthening    PT Home Exercise Plan  STS from chair, seated hip abd, seated marching    Consulted and Agree with Plan of Care  Patient       Patient will benefit from skilled therapeutic intervention in order to improve the following deficits and impairments:  Abnormal gait, Decreased balance, Decreased endurance, Decreased mobility, Difficulty walking, Hypomobility, Cardiopulmonary status limiting activity, Decreased range of motion, Improper body mechanics, Decreased activity tolerance, Decreased coordination, Decreased safety awareness, Decreased strength, Increased fascial restricitons, Impaired flexibility, Postural dysfunction, Pain  Visit Diagnosis: Difficulty in walking, not elsewhere classified  Chronic midline low back pain without sciatica  Repeated falls     Problem List Patient Active Problem List   Diagnosis Date Noted  . Morbid obesity ([place]) 04/08/2018  . Age-related osteoporosis without current pathological fracture 09/26/2017  . Pain in both hands 09/23/2017  . Hyperparathyroidism ([place])  08/06/2017  . Chronic kidney disease, stage III (moderate) 08/06/2017  . HLD (hyperlipidemia) 07/25/2017  . Osteoporotic compression fracture of spine ([place]) 03/22/2017  . Assistance needed with transportation 03/21/2017  . Iron deficiency anemia 12/19/2016  . Aortic atherosclerosis ([place]) 11/23/2016  . Anterolisthesis 11/23/2016  . Chronic pain syndrome 09/13/2016  . Vitamin D deficiency 08/19/2016  . Anemia, unspecified 08/19/2016  . Primary open angle glaucoma (POAG) of both eyes, severe stage 08/17/2016  . Hypertensive retinopathy 08/17/2016  . Long term current use of opiate analgesic 07/19/2016  . Dyslipidemia associated with type 2 diabetes mellitus ([place]) 04/16/2016  . Chronic midline low back pain without sciatica 04/16/2016  . Chronic pain of right knee 04/16/2016  . Vaginal dryness 02/22/2015  . Calculus of kidney 11/10/2014  . Osteoarthritis, multiple sites 09/09/2014  . Type 2 diabetes mellitus with microalbuminuria ([place]) 09/09/2014  . Glaucoma 09/09/2014  . Benign hypertension with CKD (chronic kidney disease) stage III ([place]) 09/09/2014  . Chronic pain 09/09/2014  . Tinea corporis 09/09/2014  . Umbilical hernia [id number]  . Lump or mass in breast    Shelton Silvas PT, DPT Shelton Silvas 04/23/2019, 5:07 PM  [facility] [address] [place], [address] Phone: [phone]   Fax:  [phone]  Name: MARISKA STENNETT MRN: [id number] Date of Birth: September 05, 1932

## 2019-04-24 ENCOUNTER — Other Ambulatory Visit: Payer: Self-pay | Admitting: Surgery

## 2019-04-24 ENCOUNTER — Other Ambulatory Visit: Payer: Self-pay

## 2019-04-24 DIAGNOSIS — N6310 Unspecified lump in the right breast, unspecified quadrant: Secondary | ICD-10-CM

## 2019-04-27 ENCOUNTER — Other Ambulatory Visit: Payer: Self-pay

## 2019-04-27 DIAGNOSIS — E1122 Type 2 diabetes mellitus with diabetic chronic kidney disease: Secondary | ICD-10-CM | POA: Diagnosis not present

## 2019-04-27 DIAGNOSIS — N1831 Chronic kidney disease, stage 3a: Secondary | ICD-10-CM | POA: Diagnosis not present

## 2019-04-27 DIAGNOSIS — N2581 Secondary hyperparathyroidism of renal origin: Secondary | ICD-10-CM | POA: Diagnosis not present

## 2019-04-27 DIAGNOSIS — I10 Essential (primary) hypertension: Secondary | ICD-10-CM | POA: Diagnosis not present

## 2019-04-28 ENCOUNTER — Encounter: Payer: Self-pay | Admitting: Physical Therapy

## 2019-04-28 ENCOUNTER — Encounter: Payer: Self-pay | Admitting: Gastroenterology

## 2019-04-28 ENCOUNTER — Ambulatory Visit: Payer: Medicare Other | Admitting: Physical Therapy

## 2019-04-28 ENCOUNTER — Other Ambulatory Visit: Payer: Self-pay

## 2019-04-28 ENCOUNTER — Telehealth: Payer: Self-pay | Admitting: Surgery

## 2019-04-28 ENCOUNTER — Ambulatory Visit (INDEPENDENT_AMBULATORY_CARE_PROVIDER_SITE_OTHER): Payer: Medicare Other

## 2019-04-28 DIAGNOSIS — R262 Difficulty in walking, not elsewhere classified: Secondary | ICD-10-CM | POA: Diagnosis not present

## 2019-04-28 DIAGNOSIS — Z780 Asymptomatic menopausal state: Secondary | ICD-10-CM | POA: Diagnosis not present

## 2019-04-28 DIAGNOSIS — Z0000 Encounter for general adult medical examination without abnormal findings: Secondary | ICD-10-CM

## 2019-04-28 DIAGNOSIS — M545 Low back pain, unspecified: Secondary | ICD-10-CM

## 2019-04-28 DIAGNOSIS — G8929 Other chronic pain: Secondary | ICD-10-CM

## 2019-04-28 DIAGNOSIS — R296 Repeated falls: Secondary | ICD-10-CM | POA: Diagnosis not present

## 2019-04-28 NOTE — Progress Notes (Signed)
Subjective:   Taylor [name] is a 86 y.o. female who presents for Medicare Annual (Subsequent) preventive examination.  Virtual Visit via Telephone Note  I connected with Taylor [name] on 04/28/19 at 12:00 PM EST by telephone and verified that I am speaking with the correct person using two identifiers.  Medicare Annual Wellness visit completed telephonically due to Covid-19 pandemic.   Location: Patient: home Provider: office   I discussed the limitations, risks, security and privacy concerns of performing an evaluation and management service by telephone and the availability of in person appointments. The patient expressed understanding and agreed to proceed.  Some vital signs may be absent or patient reported.   [name], LPN    Review of Systems:   Cardiac Risk Factors include: advanced age (>16men, >63 women);obesity (BMI >30kg/m2);hypertension;dyslipidemia;diabetes mellitus;sedentary lifestyle     Objective:     Vitals: There were no vitals taken for this visit.  There is no height or weight on file to calculate BMI.  Advanced Directives 04/28/2019 12/30/2018 04/08/2018 09/23/2017 05/27/2017 01/07/2017 11/13/2016  Does Patient Have a Medical Advance Directive? Yes Yes Yes No No No No  Type of Paramedic of [place];Living will [place];Living will [place];Living will - - - -  Does patient want to make changes to medical advance directive? - No - Patient declined - - - - -  Copy of [place] in Chart? No - copy requested No - copy requested No - copy requested - - - -  Would patient like information on creating a medical advance directive? - - - No - Patient declined No - Patient declined - -    Tobacco Social History   Tobacco Use  Smoking Status Never Smoker  Smokeless Tobacco Never Used     Counseling given: Not Answered   Clinical Intake:  Pre-visit preparation completed:  Yes  Pain : 0-10 Pain Score: 7  Pain Type: Chronic pain Pain Location: Knee Pain Orientation: Right Pain Descriptors / Indicators: Aching, Sore Pain Onset: More than a month ago Pain Frequency: Other (Comment)     Nutritional Risks: None Diabetes: Yes CBG done?: No Did pt. bring in CBG monitor from home?: No   Nutrition Risk Assessment:  Has the patient had any N/V/D within the last 2 months?  No  Does the patient have any non-healing wounds?  No  Has the patient had any unintentional weight loss or weight gain?  No   Diabetes:  Is the patient diabetic?  Yes  If diabetic, was a CBG obtained today?  No  Did the patient bring in their glucometer from home?  No  How often do you monitor your CBG's? daily.   Financial Strains and Diabetes Management:  Are you having any financial strains with the device, your supplies or your medication? No .  Does the patient want to be seen by Chronic Care Management for management of their diabetes?  No  Would the patient like to be referred to a Nutritionist or for Diabetic Management?  No   Diabetic Exams:  Diabetic Eye Exam: Completed 08/04/18.   Diabetic Foot Exam: Completed 08/13/18.   How often do you need to have someone help you when you read instructions, pamphlets, or other written materials from your doctor or pharmacy?: 1 - Never  Interpreter Needed?: No  Information entered by :: [name] LPN  Past Medical History:  Diagnosis Date  . Abnormal mammogram, unspecified  2013  . Breast screening, unspecified 2013  . Diabetes mellitus without complication ([place]) [id number]   non insulin dependent  . GERD (gastroesophageal reflux disease)   . Glaucoma 2003  . Gout   . Hyperlipidemia 2008  . Hypertension 1980's  . Lump or mass in breast 01/03/2012   left breast  . Obesity, unspecified 2013  . Osteoporosis   . Shingles 2013  . Special screening for malignant neoplasms, colon 2013   Past Surgical History:  Procedure  Laterality Date  . ABDOMINAL HYSTERECTOMY  1958   menorrhagia  . BACK SURGERY    . BREAST BIOPSY Left 01/25/2012  . BREAST BIOPSY Right 04/08/2019   hear clip, [place] bx, path pending  . CATARACT EXTRACTION, BILATERAL Bilateral    1st 06/07/15 and the 2nd 06/21/15  . CHOLECYSTECTOMY    . COLONOSCOPY  2003   [facility]  . CORONARY ARTERY BYPASS GRAFT Left 01/03/2012,01/24/2012   Left FNA, Left breast Encore bx  . KNEE SURGERY  2009,2011,2012   twice on right and once on left  . KNEE SURGERY    . LIPOMA EXCISION  1998  . [place]  2004  . TONSILLECTOMY     age of 33   Family History  Problem Relation Age of Onset  . Cancer Mother        skin cancer  . Stroke Mother   . Heart disease Father   . Breast cancer Neg Hx    Social History   Socioeconomic History  . Marital status: Divorced    Spouse name: Not on file  . Number of children: 4  . Years of education: Not on file  . Highest education level: Some college, no degree  Occupational History  . 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 Smoking status: Never Smoker  . Smokeless tobacco: Never Used  Substance and Sexual Activity  . Alcohol use: No    Alcohol/week: 0.0 standard drinks  . Drug use: No  . Sexual activity: Not Currently  Other Topics Concern  . Not on file  Social History Narrative   Lives alone   Children do not live in town   Friend helps out: Cira Rue - [phone]   Independent prior to her fall 10/2016   Social Determinants of Health   Financial Resource Strain: Low Risk   . Difficulty of Paying Living Expenses: Not very hard  Food Insecurity: No Food Insecurity  . Worried About Charity fundraiser in the Last Year: Never true  . Ran Out of Food in the Last Year: Never true  Transportation Needs: No Transportation Needs  . Lack of Transportation (Medical): No  . Lack of Transportation (Non-Medical): No  Physical Activity: Inactive  . Days of  Exercise per Week: 0 days  . Minutes of Exercise per Session: 0 min  Stress: No Stress Concern Present  . Feeling of Stress : Not at all  Social Connections: Slightly Isolated  . Frequency of Communication with Friends and Family: More than three times a week  . Frequency of Social Gatherings with Friends and Family: Twice a week  . Attends Religious Services: More than 4 times per year  . Active Member of Clubs or Organizations: Yes  . Attends Archivist Meetings: More than 4 times per year  . Marital Status: Divorced    Outpatient Encounter Medications as of 04/28/2019  Medication Sig  . acetaminophen (TYLENOL) 500  MG tablet Take 1 tablet (500 mg total) by mouth 3 (three) times daily. (Patient taking differently: Take 500 mg by mouth every 6 (six) hours as needed for moderate pain or headache. )  . amLODipine (NORVASC) 10 MG tablet TAKE 1 TABLET BY MOUTH EVERY DAY  . AZOPT 1 % ophthalmic suspension Place 1 drop into both eyes in the morning and at bedtime.   . bimatoprost (LUMIGAN) 0.01 % SOLN Place 1 drop into both eyes at bedtime.   . brimonidine-timolol (COMBIGAN) 0.2-0.5 % ophthalmic solution Place 1 drop into both eyes 2 (two) times daily.   . Calcium Carbonate-Vitamin D (OYSTER SHELL CALCIUM 500 + D PO) Take 1 tablet by mouth 2 (two) times a day.  . famotidine (PEPCID) 20 MG tablet TAKE 1 TABLET BY MOUTH EVERY DAY (Patient taking differently: Take 20 mg by mouth daily. )  . ferrous sulfate 325 (65 FE) MG tablet Take 1 tablet (325 mg total) by mouth daily with breakfast.  . fluticasone (FLONASE) 50 MCG/ACT nasal spray SPRAY 2 SPRAYS INTO EACH NOSTRIL EVERY DAY (Patient taking differently: Place 1 spray into both nostrils daily as needed for allergies. )  . metFORMIN (GLUCOPHAGE-XR) 750 MG 24 hr tablet Take 1 tablet (750 mg total) by mouth daily with breakfast.  . olmesartan (BENICAR) 20 MG tablet Take 1 tablet (20 mg total) by mouth daily.  [name] omega-3 acid ethyl esters  (LOVAZA) 1 g capsule TAKE 1 CAPSULE (1 G TOTAL) BY MOUTH 2 (TWO) TIMES DAILY.  [name] Delica Lancets [id number] MISC 1 each by Does not apply route daily.  [name] ULTRA test strip USE AS DIRECTED  . oxyCODONE-acetaminophen (PERCOCET) 7.5-325 MG tablet Take 1 tablet by mouth every 8 (eight) hours as needed for moderate pain or severe pain.  . pravastatin (PRAVACHOL) 40 MG tablet Take 1 tablet (40 mg total) by mouth daily.  . Vitamin D, Ergocalciferol, (DRISDOL) 1.25 MG (50000 UNIT) CAPS capsule Take 50,000 Units by mouth every Wednesday.   No facility-administered encounter medications on file as of 04/28/2019.    Activities of Daily Living In your present state of health, do you have any difficulty performing the following activities: 04/28/2019 04/17/2019  Hearing? Y N  Comment wears hearing aids -  Vision? Y Y  Difficulty concentrating or making decisions? N N  Walking or climbing stairs? Y Y  Dressing or bathing? N N  Doing errands, shopping? Tempie Donning  Preparing Food and eating ? N -  Using the Toilet? N -  In the past six months, have you accidently leaked urine? N -  Do you have problems with loss of bowel control? N -  Managing your Medications? N -  Managing your Finances? N -  Housekeeping or managing your Housekeeping? N -  Some recent data might be hidden    Patient Care Team: [name], MD as PCP - General (Family Medicine) Andree Elk Alvina Filbert, MD as Consulting Physician (Anesthesiology) [name], DPM as Consulting Physician (Podiatry) [name], MD (Nephrology)    Assessment:   This is a routine wellness examination for Joelisa.  Exercise Activities and Dietary recommendations Current Exercise Habits: The patient does not participate in regular exercise at present, Exercise limited by: orthopedic condition(s)  Goals    . DIET - INCREASE WATER INTAKE     Recommend drinking 6-8 glasses of water per day       Fall Risk Fall Risk  04/28/2019 04/17/2019 12/15/2018  08/13/2018 04/08/2018  Falls in the  past year? 0 0 0 0 0  Comment - - - - -  Number falls in past yr: 0 0 0 0 0  Injury with Fall? 0 0 0 0 0  Risk Factor Category  - - - - -  Risk for fall due to : Impaired balance/gait;Impaired mobility;Orthopedic patient - - - Impaired balance/gait  Follow up Falls prevention discussed - - - Falls prevention discussed   FALL RISK PREVENTION PERTAINING TO THE HOME:  Any stairs in or around the home? No  If so, do they handrails? No  - pt has ramp  Home free of loose throw rugs in walkways, pet beds, electrical cords, etc? Yes  Adequate lighting in your home to reduce risk of falls? Yes   ASSISTIVE DEVICES UTILIZED TO PREVENT FALLS:  Life alert? Yes  Use of a cane, walker or w/c? Yes  Grab bars in the bathroom? Yes  Shower chair or bench in shower? Yes  Elevated toilet seat or a handicapped toilet? Yes   DME ORDERS:  DME order needed?  No   TIMED UP AND GO:  Was the test performed? No . Telephonic visit.   Education: Fall risk prevention has been discussed.  Intervention(s) required? No   Depression Screen PHQ 2/9 Scores 04/28/2019 04/17/2019 12/15/2018 08/13/2018  PHQ - 2 Score 0 0 0 0  PHQ- 9 Score - 0 0 2  Exception Documentation - - - -     Cognitive Function     6CIT Screen 04/28/2019 04/08/2018  What Year? 0 points 0 points  What month? 0 points 0 points  What time? 0 points 0 points  Count back from 20 0 points 0 points  Months in reverse 0 points 0 points  Repeat phrase 0 points 0 points  Total Score 0 0    Immunization History  Administered Date(s) Administered  . Fluad Quad(high Dose 65+) 12/15/2018  . Influenza, High Dose Seasonal PF 11/10/2014, 11/17/2015, 11/23/2016, 12/06/2017  . Influenza-Unspecified 10/20/2012, 11/10/2014  . Pneumococcal Conjugate-13 05/12/2013, 08/05/2015  . Tdap 10/19/2010    Qualifies for Shingles Vaccine? Yes . Due for Shingrix. Education has been provided regarding the importance of this  vaccine. Pt has been advised to call insurance company to determine out of pocket expense. Advised may also receive vaccine at local pharmacy or Health Dept. Verbalized acceptance and understanding.  Tdap: Up to date  Flu Vaccine: Up to date  Pneumococcal Vaccine: Up to date   Screening Tests Health Maintenance  Topic Date Due  . OPHTHALMOLOGY EXAM  08/04/2019  . FOOT EXAM  08/13/2019  . MAMMOGRAM  09/10/2019  . HEMOGLOBIN A1C  10/15/2019  . TETANUS/TDAP  10/18/2020  . INFLUENZA VACCINE  Completed  . DEXA SCAN  Completed  . PNA vac Low Risk Adult  Completed    Cancer Screenings:  Colorectal Screening:  No longer required.   Mammogram: Completed 09/10/18. Repeat every year; pt has since had breast ultrasounds and has upcoming surgery    Bone Density: Completed 04/30/17. Results reflect OSTEOPOROSIS. Repeat every 2 years. Ordered today. Pt provided with contact information and advised to call to schedule appt.   Lung Cancer Screening: (Low Dose CT Chest recommended if Age 39-80 years, 30 pack-year currently smoking OR have quit w/in 15years.) does not qualify.    Additional Screening:  Hepatitis C Screening: no longer required  Vision Screening: Recommended annual ophthalmology exams for early detection of glaucoma and other disorders of the eye. Is the patient up to  date with their annual eye exam?  Yes  Who is the provider or what is the name of the office in which the pt attends annual eye exams? Dr. Katy Fitch  Dental Screening: Recommended annual dental exams for proper oral hygiene  Community Resource Referral:  CRR required this visit?  No      Plan:     I have personally reviewed and addressed the Medicare Annual Wellness questionnaire and have noted the following in the patient's chart:  A. Medical and social history B. Use of alcohol, tobacco or illicit drugs  C. Current medications and supplements D. Functional ability and status E.  Nutritional status F.    Physical activity G. Advance directives H. List of other physicians I.  Hospitalizations, surgeries, and ER visits in previous 12 months J.  [place] such as hearing and vision if needed, cognitive and depression L. Referrals and appointments   In addition, I have reviewed and discussed with patient certain preventive protocols, quality metrics, and best practice recommendations. A written personalized care plan for preventive services as well as general preventive health recommendations were provided to patient.   Signed,  [name], LPN Nurse Health Advisor   Nurse Notes: none

## 2019-04-28 NOTE — Patient Instructions (Signed)
Taylor [name] , Thank you for taking time to come for your Medicare Wellness Visit. I appreciate your ongoing commitment to your health goals. Please review the following plan we discussed and let me know if I can assist you in the future.   Screening recommendations/referrals: Colonoscopy: no longer required Mammogram: done 09/10/18  Bone Density: done 04/30/17. Please call [phone] to schedule your bone density screening.  Recommended yearly ophthalmology/optometry visit for glaucoma screening and checkup Recommended yearly dental visit for hygiene and checkup  Vaccinations: Influenza vaccine: done 12/15/18 Pneumococcal vaccine: done 08/05/15 Tdap vaccine: done 10/19/10 Shingles vaccine: Shingrix discussed. Please contact your pharmacy for coverage information.    Advanced directives: Please bring a copy of your health care power of attorney and living will to the office at your convenience.  Conditions/risks identified: Recommend drinking 6-8 glasses of water per day  Next appointment: Please follow up in one year for your Medicare Annual Wellness visit.     Preventive Care 50 Years and Older, Female Preventive care refers to lifestyle choices and visits with your health care provider that can promote health and wellness. What does preventive care include?  A yearly physical exam. This is also called an annual well check.  Dental exams once or twice a year.  Routine eye exams. Ask your health care provider how often you should have your eyes checked.  Personal lifestyle choices, including:  Daily care of your teeth and gums.  Regular physical activity.  Eating a healthy diet.  Avoiding tobacco and drug use.  Limiting alcohol use.  Practicing safe sex.  Taking low-dose aspirin every day.  Taking vitamin and mineral supplements as recommended by your health care provider. What happens during an annual well check? The services and screenings done by your health care  provider during your annual well check will depend on your age, overall health, lifestyle risk factors, and family history of disease. Counseling  Your health care provider may ask you questions about your:  Alcohol use.  Tobacco use.  Drug use.  Emotional well-being.  Home and relationship well-being.  Sexual activity.  Eating habits.  History of falls.  Memory and ability to understand (cognition).  Work and work Statistician.  Reproductive health. Screening  You may have the following tests or measurements:  Height, weight, and BMI.  Blood pressure.  Lipid and cholesterol levels. These may be checked every 5 years, or more frequently if you are over 37 years old.  Skin check.  Lung cancer screening. You may have this screening every year starting at age 13 if you have a 30-pack-year history of smoking and currently smoke or have quit within the past 15 years.  Fecal occult blood test (FOBT) of the stool. You may have this test every year starting at age 44.  Flexible sigmoidoscopy or colonoscopy. You may have a sigmoidoscopy every 5 years or a colonoscopy every 10 years starting at age 50.  Hepatitis C blood test.  Hepatitis B blood test.  Sexually transmitted disease (STD) testing.  Diabetes screening. This is done by checking your blood sugar (glucose) after you have not eaten for a while (fasting). You may have this done every 1-3 years.  Bone density scan. This is done to screen for osteoporosis. You may have this done starting at age 30.  Mammogram. This may be done every 1-2 years. Talk to your health care provider about how often you should have regular mammograms. Talk with your health care provider about your test results,  treatment options, and if necessary, the need for more tests. Vaccines  Your health care provider may recommend certain vaccines, such as:  Influenza vaccine. This is recommended every year.  Tetanus, diphtheria, and acellular  pertussis (Tdap, Td) vaccine. You may need a Td booster every 10 years.  Zoster vaccine. You may need this after age 46.  Pneumococcal 13-valent conjugate (PCV13) vaccine. One dose is recommended after age 85.  Pneumococcal polysaccharide (PPSV23) vaccine. One dose is recommended after age 66. Talk to your health care provider about which screenings and vaccines you need and how often you need them. This information is not intended to replace advice given to you by your health care provider. Make sure you discuss any questions you have with your health care provider. Document Released: 03/04/2015 Document Revised: 10/26/2015 Document Reviewed: 12/07/2014 Elsevier Interactive Patient Education  2017 [place] Prevention in the Home Falls can cause injuries. They can happen to people of all ages. There are many things you can do to make your home safe and to help prevent falls. What can I do on the outside of my home?  Regularly fix the edges of walkways and driveways and fix any cracks.  Remove anything that might make you trip as you walk through a door, such as a raised step or threshold.  Trim any bushes or trees on the path to your home.  Use bright outdoor lighting.  Clear any walking paths of anything that might make someone trip, such as rocks or tools.  Regularly check to see if handrails are loose or broken. Make sure that both sides of any steps have handrails.  Any raised decks and porches should have guardrails on the edges.  Have any leaves, snow, or ice cleared regularly.  Use sand or salt on walking paths during winter.  Clean up any spills in your garage right away. This includes oil or grease spills. What can I do in the bathroom?  Use night lights.  Install grab bars by the toilet and in the tub and shower. Do not use towel bars as grab bars.  Use non-skid mats or decals in the tub or shower.  If you need to sit down in the shower, use a plastic,  non-slip stool.  Keep the floor dry. Clean up any water that spills on the floor as soon as it happens.  Remove soap buildup in the tub or shower regularly.  Attach bath mats securely with double-sided non-slip rug tape.  Do not have throw rugs and other things on the floor that can make you trip. What can I do in the bedroom?  Use night lights.  Make sure that you have a light by your bed that is easy to reach.  Do not use any sheets or blankets that are too big for your bed. They should not hang down onto the floor.  Have a firm chair that has side arms. You can use this for support while you get dressed.  Do not have throw rugs and other things on the floor that can make you trip. What can I do in the kitchen?  Clean up any spills right away.  Avoid walking on wet floors.  Keep items that you use a lot in easy-to-reach places.  If you need to reach something above you, use a strong step stool that has a grab bar.  Keep electrical cords out of the way.  Do not use floor polish or wax that makes floors  slippery. If you must use wax, use non-skid floor wax.  Do not have throw rugs and other things on the floor that can make you trip. What can I do with my stairs?  Do not leave any items on the stairs.  Make sure that there are handrails on both sides of the stairs and use them. Fix handrails that are broken or loose. Make sure that handrails are as long as the stairways.  Check any carpeting to make sure that it is firmly attached to the stairs. Fix any carpet that is loose or worn.  Avoid having throw rugs at the top or bottom of the stairs. If you do have throw rugs, attach them to the floor with carpet tape.  Make sure that you have a light switch at the top of the stairs and the bottom of the stairs. If you do not have them, ask someone to add them for you. What else can I do to help prevent falls?  Wear shoes that:  Do not have high heels.  Have rubber  bottoms.  Are comfortable and fit you well.  Are closed at the toe. Do not wear sandals.  If you use a stepladder:  Make sure that it is fully opened. Do not climb a closed stepladder.  Make sure that both sides of the stepladder are locked into place.  Ask someone to hold it for you, if possible.  Clearly mark and make sure that you can see:  Any grab bars or handrails.  First and last steps.  Where the edge of each step is.  Use tools that help you move around (mobility aids) if they are needed. These include:  Canes.  Walkers.  Scooters.  Crutches.  Turn on the lights when you go into a dark area. Replace any light bulbs as soon as they burn out.  Set up your furniture so you have a clear path. Avoid moving your furniture around.  If any of your floors are uneven, fix them.  If there are any pets around you, be aware of where they are.  Review your medicines with your doctor. Some medicines can make you feel dizzy. This can increase your chance of falling. Ask your doctor what other things that you can do to help prevent falls. This information is not intended to replace advice given to you by your health care provider. Make sure you discuss any questions you have with your health care provider. Document Released: 12/02/2008 Document Revised: 07/14/2015 Document Reviewed: 03/12/2014 Elsevier Interactive Patient Education  2017 [employer].

## 2019-04-28 NOTE — Therapy (Signed)
[facility] [address], [place], [address] Phone: [phone]   Fax:  [phone]  Physical Therapy Treatment  Patient Details  Name: Taylor [name] MRN: [id number] Date of Birth: Jul 05, 1932 No data recorded  Encounter Date: 04/28/2019  PT End of Session - 04/28/19 1443    Visit Number  21    Number of Visits  49    Date for PT Re-Evaluation  06/18/19    Authorization - Visit Number  1    Authorization - Number of Visits  10    PT Start Time  0234    PT Stop Time  0315    PT Time Calculation (min)  41 min    Equipment Utilized During Treatment  Gait belt    Activity Tolerance  Patient tolerated treatment well    Behavior During Therapy  [facility] for tasks assessed/performed       Past Medical History:  Diagnosis Date  . Abnormal mammogram, unspecified 2013  . Breast screening, unspecified 2013  . Diabetes mellitus without complication ([place]) [id number]   non insulin dependent  . GERD (gastroesophageal reflux disease)   . Glaucoma 2003  . Gout   . Hyperlipidemia 2008  . Hypertension 1980's  . Lump or mass in breast 01/03/2012   left breast  . Obesity, unspecified 2013  . Osteoporosis   . Shingles 2013  . Special screening for malignant neoplasms, colon 2013    Past Surgical History:  Procedure Laterality Date  . ABDOMINAL HYSTERECTOMY  1958   menorrhagia  . BACK SURGERY    . BREAST BIOPSY Left 01/25/2012  . BREAST BIOPSY Right 04/08/2019   hear clip, [place] bx, path pending  . CATARACT EXTRACTION, BILATERAL Bilateral    1st 06/07/15 and the 2nd 06/21/15  . CHOLECYSTECTOMY    . COLONOSCOPY  2003   [facility]  . CORONARY ARTERY BYPASS GRAFT Left 01/03/2012,01/24/2012   Left FNA, Left breast [place] bx  . KNEE SURGERY  2009,2011,2012   twice on right and once on left  . KNEE SURGERY    . LIPOMA EXCISION  1998  . [place]  2004  . TONSILLECTOMY     age of 59    There were no vitals filed for this  visit.  Subjective Assessment - 04/28/19 1438    Subjective  Patient reports some R knee pain today that has gotten worse over the weekend. She is having a lumpectomy on the 18th which she is nervous about. She is doing her HEP, but reports she did not do them yesterday because her knee was hurting her.    Pertinent History  Patinet is a 86 year old female presenting to clinic for LBP. Patient has LBP that bothers her when she stands up for 10-62mins for ADLS. Back pain does not radiate and feels achy. She can only sit without back support for 5mins without pain. Worst pain in the past week 9/10 best: 5/10. Reports she has had about 3 falls in the past year, and is usually falling backwards. Reports she is very unsteady with stepping onto varied surfaces. Ambulates with a hurry cane in her LUE and uses her RUE to furiture walk, and has a RW that she will use when she has someone to put it in her car with her when she goes out. When she gets to a store, she uses cart to help her walk. She lives alone and completes her own cooking, cleaning, but  has trouble standing for the time it takes to cook/do dishing. Patient reports she gives herself bird baths in her bathroom because she cannot step over her tub to get into her tub/shower that has a seat in it. She drives and completes her own errands. When her church was open she enjoyed attending, enjoys playing games on her Ipad, and has friends in the community that come and check on her daily, as well as being apart of the "Are You Okay" program with the [place] police (they call daily and if you do not answer they will send the police there to check on you). No steps to enter 1 story home, has a ramp. Denies sensation deficits.  Pt denies N/V, B&B changes, unexplained weight fluctuation, saddle paresthesia, fever, night sweats, or unrelenting night pain at this time.    Limitations  Sitting;Lifting;Standing;Walking    How long can you sit comfortably?  without  support 10-30mins    How long can you stand comfortably?  10-77mins    How long can you walk comfortably?  with cane 53mins    Patient Stated Goals  Walk better         THEREX  -Nustep  L3 for 5 min; use of UE setting 7 keeping spm above 70 consistently for gentle LE strengthening -Alt toe taps onto 4in step x10 each LE; 6in x10 each LE with unilateral UE support, increased difficulty with RLE, cuing for goot raise to lower, instead of dragging foot off step with good carry over; CGA needed for 6in for safety; standing rest between  - Standing rotations with yellow tball (UEs flex) 2 x10 each direction with increased speed this session, standing rest between, CGA for safety  Gait Training -Walking through clinic outside down 5% grade ramp with PT min assist for correcting balance - Walking through grass 84ft with CGA, occasional need for minA to re-establish balance -Up 5 stairs using handrail and cane; PT CGA cueing for reciprocal step pattern up stairs with some carryover, minA for reciprocal gait success ; pt took seated rest break at top of staircase -Walking down 10% grade ramp with cane PT CGA, occasional use of handrail, but pt able to correct perturbations without support most of time - Walking over small mulch hill with minA to maintain balance with downhill -Walking across 0% sidewalk with cognitive tasks (counting up by 2 to 21, by 3 to 21, listing fruits, and color categories                        PT Education - 04/28/19 1442    Education Details  therex form; gait training    Person(s) Educated  Patient    Methods  Explanation;Demonstration;Tactile cues;Verbal cues    Comprehension  Verbalized understanding;Returned demonstration;Verbal cues required;Tactile cues required       PT Short Term Goals - 04/23/19 1443      PT SHORT TERM GOAL #1   Title  Pt will be independent with HEP in order to improve strength and decrease back pain in order to  improve pain-free function at home and increase QOL    Baseline  04/23/19 75% compliance subjectively reported from patient 03/03/19 Completing HEP with 50% compliance    Time  4    Period  Weeks    Status  On-going      PT SHORT TERM GOAL #2   Title  Pt will improve BERG by at least 3 points in order to  demonstrate clinically significant improvement in balance.    Baseline  03/03/19 31/56    Time  6    Period  Weeks    Status  Achieved      PT SHORT TERM GOAL #3   Title  Pt will increase 10MWT by at least 0.13 m/s in order to demonstrate clinically significant improvement in community ambulation.    Baseline  04/23/19 0.31m/s 12/30/18 0.18 m/s    Time  6    Period  Weeks    Status  Achieved      PT SHORT TERM GOAL #4   Title  Pt will decrease 5TSTS by at least 3 seconds in order to demonstrate clinically significant improvement in LE strength    Baseline  04/23/19 20sec1/12/21 36sec    Time  6    Period  Weeks    Status  Achieved        PT Long Term Goals - 04/23/19 1450      PT LONG TERM GOAL #1   Title  Patient will obtain a score of 46/56 on the BERG to demonstrate decreased fall risk    Baseline  04/23/19 33/56 03/02/54 31/56    Time  12    Period  Weeks    Status  On-going      PT LONG TERM GOAL #2   Title  Patient will demonstrate walk speed of 0.8-1.63m/s on 10MWT in order to demonstrate household ambulator walk speed with safety    Baseline  04/23/19 0.64m/s with CGA and [facility] 12/30/18 0.22m/s with HHA with SPC, needing min-modA to  Prevent LOB on 4 occasions    Time  12    Period  Weeks    Status  On-going      PT LONG TERM GOAL #3   Title  Patient will complete 5xSTS without UE support in 14.8sec in order to demonstrate age matched strength norms    Baseline  04/23/19 20sec 03/03/19 36sec    Time  12    Period  Weeks    Status  On-going      PT LONG TERM GOAL #4   Title  Patient will be able to ambulate 226ft with SPC without LOB/need for external support, and without  instances of LOB, in order to demonstrate safety walking household distances/into story    Baseline  04/23/19 19ft with SPC and CGA with high gaurd with RUE, 1 instance of incresaed time needed to re-establish balance.    Time  12    Period  Weeks    Status  Revised            Plan - 04/28/19 1539    Clinical Impression Statement  PT continued therex progression for LE strength an balance, with continued carry over of dynamic gait with success. Patient is motivated throughout session, and is willing to attempt all tasks, with some need for assistance to maintain balance occassionally. PT will continue progression as able    Personal Factors and Comorbidities  Age;Fitness;Comorbidity 1;Comorbidity 2;Comorbidity 3+;Past/Current Experience;Sex    Comorbidities  HTN, HLD, GERD, osteoporosis    Examination-Activity Limitations  Bathing;Sit;Dressing;Transfers;Bed Mobility;Bend;Lift;Carry;Reach Overhead;Stand;Stairs    Examination-Participation Restrictions  Church;Laundry;Cleaning;Community Activity;Meal Prep    Stability/Clinical Decision Making  Evolving/Moderate complexity    Clinical Decision Making  Moderate    Rehab Potential  Good    PT Frequency  2x / week    PT Duration  8 weeks    PT Treatment/Interventions  Moist Heat;Traction;Gait training;Stair training;Balance  training;Dry needling;Joint Manipulations;Spinal Manipulations;Passive range of motion;Manual techniques;Patient/family education;Therapeutic exercise;Electrical Stimulation;ADLs/Self Care Home Management;Therapeutic activities;Functional mobility training;Energy conservation;Neuromuscular re-education    PT Next Visit Plan  Balance training, LE/core strengthening    PT Home Exercise Plan  STS from chair, seated hip abd, seated marching    Consulted and Agree with Plan of Care  Patient       Patient will benefit from skilled therapeutic intervention in order to improve the following deficits and impairments:  Abnormal  gait, Decreased balance, Decreased endurance, Decreased mobility, Difficulty walking, Hypomobility, Cardiopulmonary status limiting activity, Decreased range of motion, Improper body mechanics, Decreased activity tolerance, Decreased coordination, Decreased safety awareness, Decreased strength, Increased fascial restricitons, Impaired flexibility, Postural dysfunction, Pain  Visit Diagnosis: Difficulty in walking, not elsewhere classified  Chronic midline low back pain without sciatica  Repeated falls     Problem List Patient Active Problem List   Diagnosis Date Noted  . Morbid obesity ([place]) 04/08/2018  . Age-related osteoporosis without current pathological fracture 09/26/2017  . Pain in both hands 09/23/2017  . Hyperparathyroidism ([place]) 08/06/2017  . Chronic kidney disease, stage III (moderate) 08/06/2017  . HLD (hyperlipidemia) 07/25/2017  . Osteoporotic compression fracture of spine ([place]) 03/22/2017  . Assistance needed with transportation 03/21/2017  . Iron deficiency anemia 12/19/2016  . Aortic atherosclerosis ([place]) 11/23/2016  . Anterolisthesis 11/23/2016  . Chronic pain syndrome 09/13/2016  . Vitamin D deficiency 08/19/2016  . Anemia, unspecified 08/19/2016  . Primary open angle glaucoma (POAG) of both eyes, severe stage 08/17/2016  . Hypertensive retinopathy 08/17/2016  . Long term current use of opiate analgesic 07/19/2016  . Dyslipidemia associated with type 2 diabetes mellitus ([place]) 04/16/2016  . Chronic midline low back pain without sciatica 04/16/2016  . Chronic pain of right knee 04/16/2016  . Vaginal dryness 02/22/2015  . Calculus of kidney 11/10/2014  . Osteoarthritis, multiple sites 09/09/2014  . Type 2 diabetes mellitus with microalbuminuria ([place]) 09/09/2014  . Glaucoma 09/09/2014  . Benign hypertension with CKD (chronic kidney disease) stage III ([place]) 09/09/2014  . Chronic pain 09/09/2014  . Tinea corporis 09/09/2014  . Umbilical hernia [id number]  .  Lump or mass in breast    Shelton Silvas PT, DPT Shelton Silvas 04/28/2019, 3:45 PM  [facility] [address] [place], [address] Phone: [phone]   Fax:  [phone]  Name: Taylor [name] MRN: [id number] Date of Birth: Dec 29, 1932

## 2019-04-28 NOTE — Telephone Encounter (Signed)
Pt has been advised of pre admission date/time, Covid Testing date and Surgery date.  Surgery Date: 05/07/19 Preadmission Testing Date: 04/30/19 (phone 8a-1p) Covid Testing Date: 05/05/19 - patient advised to go to the [facility]  Patient has been made aware to call [phone], between 1-3:00pm the day before surgery, to find out what time to arrive.

## 2019-04-29 ENCOUNTER — Encounter: Payer: Self-pay | Admitting: Gastroenterology

## 2019-04-29 ENCOUNTER — Ambulatory Visit (INDEPENDENT_AMBULATORY_CARE_PROVIDER_SITE_OTHER): Payer: Medicare Other | Admitting: Gastroenterology

## 2019-04-29 DIAGNOSIS — K219 Gastro-esophageal reflux disease without esophagitis: Secondary | ICD-10-CM | POA: Diagnosis not present

## 2019-04-29 DIAGNOSIS — D509 Iron deficiency anemia, unspecified: Secondary | ICD-10-CM | POA: Diagnosis not present

## 2019-04-29 DIAGNOSIS — R634 Abnormal weight loss: Secondary | ICD-10-CM | POA: Diagnosis not present

## 2019-04-29 NOTE — Progress Notes (Signed)
Taylor [name] [address]  [place]  [place], [place] [address]  Main: [phone]  Fax: [phone]   Gastroenterology Consultation  Referring Provider:     [name], MD Primary Care Physician:  [name], MD Reason for Consultation:     Iron deficiency anemia, weight loss        HPI:   Virtual Visit via Telephone Note  I connected with patient on 04/29/19 at 11:30 AM EST by telephone and verified that I am speaking with the correct person using two identifiers.   I discussed the limitations, risks, security and privacy concerns of performing an evaluation and management service by telephone and the availability of in person appointments. I also discussed with the patient that there may be a patient responsible charge related to this service. The patient expressed understanding and agreed to proceed.  Location of the patient: Home Location of provider: Home Participating persons: Patient and provider only   History of Present Illness: Chief Complaint  Patient presents with  . Gastroesophageal Reflux    Patient has some acid feflex      Taylor [name] is a 86 y.o. y/o female referred for consultation & management  by Dr. [name], MD.  Patient with iron deficiency anemia, improved with oral iron replacement by PCP, with weight loss referred to [place] for the same.  Also had heartburn for which she is on Pepcid and now is well controlled with that.  Only using Tums maybe once a week if needed.  No dysphagia.  When inquired about weight loss, she thinks she got bored of eating alone and therefore is eating less.  She was 212 pounds in 2019 and current weight is 187 pounds.  Reports history of a colonoscopy years ago in [place].  We do not have the results.  Denies any family history of colon cancer.  No prior EGD.  Is also is is also seeing surgery and is scheduled for lumpectomy due to right breast mass on the 18th.  Past Medical History:    Diagnosis Date  . Abnormal mammogram, unspecified 2013  . Breast screening, unspecified 2013  . Diabetes mellitus without complication ([place]) 5051   non insulin dependent  . GERD (gastroesophageal reflux disease)   . Glaucoma 2003  . Gout   . Hyperlipidemia 2008  . Hypertension 1980's  . Lump or mass in breast 01/03/2012   left breast  . Obesity, unspecified 2013  . Osteoporosis   . Shingles 2013  . Special screening for malignant neoplasms, colon 2013    Past Surgical History:  Procedure Laterality Date  . ABDOMINAL HYSTERECTOMY  1958   menorrhagia  . BACK SURGERY    . BREAST BIOPSY Left 01/25/2012  . BREAST BIOPSY Right 04/08/2019   hear clip, [place] bx, path pending  . CATARACT EXTRACTION, BILATERAL Bilateral    1st 06/07/15 and the 2nd 06/21/15  . CHOLECYSTECTOMY    . COLONOSCOPY  2003   [facility]  . CORONARY ARTERY BYPASS GRAFT Left 01/03/2012,01/24/2012   Left FNA, Left breast [place] bx  . KNEE SURGERY  2009,2011,2012   twice on right and once on left  . KNEE SURGERY    . LIPOMA EXCISION  1998  . [place]  2004  . TONSILLECTOMY     age of 46    Prior to Admission medications   Medication Sig Start Date End Date Taking? Authorizing Provider  acetaminophen (TYLENOL) 500 MG tablet Take 1 tablet (500 mg  total) by mouth 3 (three) times daily. Patient taking differently: Take 500 mg by mouth every 6 (six) hours as needed for moderate pain or headache.  12/12/15  Yes Sowles, Drue Stager, MD  amLODipine (NORVASC) 10 MG tablet TAKE 1 TABLET BY MOUTH EVERY DAY 02/01/19  Yes Sowles, Drue Stager, MD  AZOPT 1 % ophthalmic suspension Place 1 drop into both eyes in the morning and at bedtime.  06/02/18  Yes [provider]  bimatoprost (LUMIGAN) 0.01 % SOLN Place 1 drop into both eyes at bedtime.    Yes [provider]  brimonidine-timolol (COMBIGAN) 0.2-0.5 % ophthalmic solution Place 1 drop into both eyes 2 (two) times daily.    Yes [provider]  Calcium  Carbonate-Vitamin D (OYSTER SHELL CALCIUM 500 + D PO) Take 1 tablet by mouth 2 (two) times a day.   Yes [provider]  famotidine (PEPCID) 20 MG tablet TAKE 1 TABLET BY MOUTH EVERY DAY Patient taking differently: Take 20 mg by mouth daily.  02/02/19  Yes Sowles, Drue Stager, MD  ferrous sulfate 325 (65 FE) MG tablet Take 1 tablet (325 mg total) by mouth daily with breakfast. 12/15/18  Yes Sowles, Drue Stager, MD  fluticasone (FLONASE) 50 MCG/ACT nasal spray SPRAY 2 SPRAYS INTO EACH NOSTRIL EVERY DAY Patient taking differently: Place 1 spray into both nostrils daily as needed for allergies.  05/19/17  Yes Sowles, Drue Stager, MD  metFORMIN (GLUCOPHAGE-XR) 750 MG 24 hr tablet Take 1 tablet (750 mg total) by mouth daily with breakfast. 04/17/19  Yes Sowles, Drue Stager, MD  olmesartan (BENICAR) 20 MG tablet Take 1 tablet (20 mg total) by mouth daily. 04/17/19  Yes Sowles, Drue Stager, MD  omega-3 acid ethyl esters (LOVAZA) 1 g capsule TAKE 1 CAPSULE (1 G TOTAL) BY MOUTH 2 (TWO) TIMES DAILY. 12/31/18  Yes Sowles, Drue Stager, MD  OneTouch Delica Lancets 41D MISC 1 each by Does not apply route daily. 10/15/18  Yes Sowles, Drue Stager, MD  [facility] ULTRA test strip USE AS DIRECTED 04/14/19  Yes [name], MD  oxyCODONE-acetaminophen (PERCOCET) 7.5-325 MG tablet Take 1 tablet by mouth every 8 (eight) hours as needed for moderate pain or severe pain. 04/04/19 05/04/19 Yes [name], MD  pravastatin (PRAVACHOL) 40 MG tablet Take 1 tablet (40 mg total) by mouth daily. 04/17/19  Yes Sowles, Drue Stager, MD  Vitamin D, Ergocalciferol, (DRISDOL) 1.25 MG (50000 UNIT) CAPS capsule Take 50,000 Units by mouth every Wednesday. 04/17/19  Yes [provider]    Family History  Problem Relation Age of Onset  . Cancer Mother        skin cancer  . Stroke Mother   . Heart disease Father   . Breast cancer Neg Hx      Social History   Tobacco Use  . Smoking status: Never Smoker  . Smokeless tobacco: Never Used  Substance  Use Topics  . Alcohol use: No    Alcohol/week: 0.0 standard drinks  . Drug use: No    Allergies as of 04/29/2019 - Review Complete 04/29/2019  Allergen Reaction Noted  . Ace inhibitors Cough 04/03/2017    Review of Systems:    All systems reviewed and negative except where noted in HPI.   Observations/Objective:  Labs: CBC    Component Value Date/Time   WBC 7.3 04/17/2019 1512   RBC 4.03 04/17/2019 1512   HGB 12.1 04/17/2019 1512   HCT 37.1 04/17/2019 1512   PLT 332 04/17/2019 1512   MCV 92.1 04/17/2019 1512   MCH 30.0  04/17/2019 1512   MCHC 32.6 04/17/2019 1512   RDW 13.3 04/17/2019 1512   LYMPHSABS 2,562 04/17/2019 1512   MONOABS 528 08/14/2016 1012   EOSABS 124 04/17/2019 1512   BASOSABS 51 04/17/2019 1512   CMP     Component Value Date/Time   NA 140 04/17/2019 1512   NA 141 08/09/2015 0921   K 4.5 04/17/2019 1512   CL 109 04/17/2019 1512   CO2 21 04/17/2019 1512   GLUCOSE 114 (H) 04/17/2019 1512   BUN 22 04/17/2019 1512   BUN 22 08/09/2015 0921   CREATININE 1.21 (H) 04/17/2019 1512   CALCIUM 10.0 04/17/2019 1512   PROT 7.2 04/17/2019 1512   PROT 6.5 08/09/2015 0921   ALBUMIN 4.0 08/14/2016 1012   ALBUMIN 4.1 08/09/2015 0921   AST 11 04/17/2019 1512   ALT 9 04/17/2019 1512   ALKPHOS 56 08/14/2016 1012   BILITOT 0.3 04/17/2019 1512   BILITOT 0.3 08/09/2015 0921   GFRNONAA 41 (L) 04/17/2019 1512   GFRAA 48 (L) 04/17/2019 1512    Imaging Studies: US BREAST LTD UNI RIGHT INC AXILLA  Result Date: 03/30/2019 CLINICAL DATA:  Follow-up for probably benign mass in the RIGHT breast. EXAM: ULTRASOUND OF THE RIGHT BREAST COMPARISON:  Previous exams including RIGHT breast ultrasound dated 10/15/2018. FINDINGS: Targeted ultrasound is performed, showing a complex cystic and solid mass in the RIGHT breast at the 9 o'clock axis, 6 cm from the nipple, central portion with suspicious internal vascularity, overall measuring 1.6 cm greatest dimension. RIGHT axilla was  evaluated with ultrasound showing no enlarged or morphologically abnormal lymph nodes. IMPRESSION: Complex cystic and solid mass in the RIGHT breast at the 9 o'clock axis, 6 cm from the nipple, overall measuring 1.6 cm greatest dimension, central portion with suspicious internal vascularity. Recommend ultrasound-guided biopsy with targeting of the central portion that demonstrates internal vascularity. RECOMMENDATION: Ultrasound-guided biopsy of the complex cystic and solid mass, with targeting of the solid-appearing central component that demonstrates internal vascularity. Ultrasound-guided biopsy will be scheduled at patient's earliest convenience. I have discussed the findings and recommendations with the patient. If applicable, a reminder letter will be sent to the patient regarding the next appointment. BI-RADS CATEGORY  4: Suspicious. Electronically Signed   By: Franki Cabot M.D.   On: 03/30/2019 13:09   MM CLIP PLACEMENT RIGHT  Result Date: 04/08/2019 CLINICAL DATA:  Post biopsy mammogram of the right breast for clip placement. EXAM: DIAGNOSTIC RIGHT MAMMOGRAM POST ULTRASOUND BIOPSY COMPARISON:  Previous exam(s). FINDINGS: Mammographic images were obtained following ultrasound guided biopsy of a mass in the right breast at 9 o'clock. The biopsy marking clip is in expected position at the site of biopsy. IMPRESSION: Appropriate positioning of the heart shaped biopsy marking clip at the site of biopsy in the lateral right breast. Final Assessment: Post Procedure Mammograms for Marker Placement Electronically Signed   By: Ammie Ferrier M.D.   On: 04/08/2019 13:54   [place] RT BREAST BX W LOC DEV 1ST LESION IMG BX SPEC US GUIDE  Addendum Date: 04/13/2019   ADDENDUM REPORT: 04/13/2019 10:43 ADDENDUM: PATHOLOGY revealed: A. BREAST, RIGHT, 9 O'CLOCK; ULTRASOUND-GUIDED CORE BIOPSY: - FIBROEPITHELIAL LESION. Comment: This appears to be a biphasic lesion with low stromal cellularity and pericanalicular growth  pattern. The epithelial component shows usual ductal hyperplasia, columnar cell change, and apocrine metaplasia, without atypia. There is no cytologic atypia or increased mitotic activity in the stromal component. However, the stromal component seems to extend into the adjacent adipose tissue without  a well-defined border. Clinical correlation and follow-up imaging to determine size stability are recommended. Pathology results are DISCORDANT with imaging findings, per Dr. Ammie Ferrier. Pathology results and recommendations below were discussed with patient by telephone on 04/13/2019. Patient reported biopsy site doing well with slight tenderness at the site. Post biopsy care instructions were reviewed and questions were answered. Patient was instructed to call [facility] if any concerns or questions arise related to the biopsy. Recommendation: Surgical consultation, as the area has increased in size on the most recent mammogram. Request for surgical referral was relayed to Eino Farber RT at [facility] by Electa Sniff RN on 04/13/2019. Addendum by Electa Sniff RN on 04/13/2019. Electronically Signed   By: Ammie Ferrier M.D.   On: 04/13/2019 10:43   Result Date: 04/13/2019 CLINICAL DATA:  86 year old female presenting for ultrasound-guided biopsy of a right breast mass. EXAM: ULTRASOUND GUIDED RIGHT BREAST CORE NEEDLE BIOPSY COMPARISON:  Previous exam(s). FINDINGS: I met with the patient and we discussed the procedure of ultrasound-guided biopsy, including benefits and alternatives. We discussed the high likelihood of a successful procedure. We discussed the risks of the procedure, including infection, bleeding, tissue injury, clip migration, and inadequate sampling. Informed written consent was given. The usual time-out protocol was performed immediately prior to the procedure. Lesion quadrant: Lower outer quadrant Using sterile technique and 1% Lidocaine as local anesthetic, under  direct ultrasound visualization, a 14 gauge spring-loaded device was used to perform biopsy of a mass in the right breast at 9 o'clock using a lateral approach. At the conclusion of the procedure heart shaped tissue marker clip was deployed into the biopsy cavity. Follow up 2 view mammogram was performed and dictated separately. IMPRESSION: Ultrasound guided biopsy of a right breast mass at 9 o'clock. No apparent complications. Electronically Signed: By: Ammie Ferrier M.D. On: 04/08/2019 13:42    Assessment and Plan:   MALYNN LUCY is a 86 y.o. y/o female has been referred for GERD, weight loss, iron deficiency anemia  Assessment and Plan: GERD is well controlled with Pepcid once daily.  Continue.  No dysphagia or alarm symptoms present.  Discussed EGD and colonoscopy for iron deficiency anemia and weight loss.  However, patient has right lumpectomy scheduled next week, and would like to await results before scheduling EGD and colonoscopy.  This is reasonable.  Risks and benefits of procedures discussed in detail.  Follow-up with me 1 week post her surgery to discuss scheduling EGD and colonoscopy both for iron deficiency anemia and weight loss.  Albeit weight loss may be due to her eating less as she states she is bored of eating alone.  Follow Up Instructions: 2 weeks  I discussed the assessment and treatment plan with the patient. The patient was provided an opportunity to ask questions and all were answered. The patient agreed with the plan and demonstrated an understanding of the instructions.   The patient was advised to call back or seek an in-person evaluation if the symptoms worsen or if the condition fails to improve as anticipated.  I provided 15 minutes of non-face-to-face time during this encounter.   Virgel Manifold, MD  Speech recognition software was used to dictate the above note.

## 2019-04-30 ENCOUNTER — Telehealth: Payer: Self-pay | Admitting: Surgery

## 2019-04-30 ENCOUNTER — Ambulatory Visit: Payer: Medicare Other | Admitting: Physical Therapy

## 2019-04-30 ENCOUNTER — Other Ambulatory Visit: Admission: RE | Admit: 2019-04-30 | Payer: Medicare Other | Source: Ambulatory Visit

## 2019-04-30 NOTE — Telephone Encounter (Signed)
Outbound call made & message left requesting a call back.  When/If she returns the call, please relay the following info:  Surgery Date: 05/12/19 Preadmission Testing Date: 04/30/19 (phone 8a-1p) Covid Testing Date: 05/08/19 - patient advised to go to the [place] ([place]  Please remind the patient to call [phone], between 1-3:00pm the day before surgery, to find out what time to arrive.    Thank you

## 2019-04-30 NOTE — Telephone Encounter (Signed)
Patient called and was given the surgery information. Told patient to call the office with any questions or concerns

## 2019-05-01 DIAGNOSIS — B351 Tinea unguium: Secondary | ICD-10-CM | POA: Diagnosis not present

## 2019-05-01 DIAGNOSIS — M79674 Pain in right toe(s): Secondary | ICD-10-CM | POA: Diagnosis not present

## 2019-05-01 DIAGNOSIS — M79675 Pain in left toe(s): Secondary | ICD-10-CM | POA: Diagnosis not present

## 2019-05-01 DIAGNOSIS — L600 Ingrowing nail: Secondary | ICD-10-CM | POA: Diagnosis not present

## 2019-05-04 ENCOUNTER — Encounter
Admission: RE | Admit: 2019-05-04 | Discharge: 2019-05-04 | Disposition: A | Discharge status: Home / Self Care | Payer: Medicare Other | Source: Ambulatory Visit | Attending: Surgery | Admitting: Surgery

## 2019-05-04 DIAGNOSIS — Z01818 Encounter for other preprocedural examination: Secondary | ICD-10-CM | POA: Diagnosis not present

## 2019-05-04 DIAGNOSIS — E119 Type 2 diabetes mellitus without complications: Secondary | ICD-10-CM | POA: Diagnosis not present

## 2019-05-04 DIAGNOSIS — I10 Essential (primary) hypertension: Secondary | ICD-10-CM | POA: Insufficient documentation

## 2019-05-04 HISTORY — DX: Anemia, unspecified: D64.9

## 2019-05-04 HISTORY — DX: Cardiac arrhythmia, unspecified: I49.9

## 2019-05-04 HISTORY — DX: Unspecified osteoarthritis, unspecified site: M19.90

## 2019-05-04 HISTORY — DX: Personal history of urinary calculi: Z87.442

## 2019-05-04 NOTE — Patient Instructions (Signed)
Your procedure is scheduled on: 05-12-19 TUESDAY Report to [place] @ 8:30 AM   Remember: Instructions that are not followed completely may result in serious medical risk, up to and including death, or upon the discretion of your surgeon and anesthesiologist your surgery may need to be rescheduled.    _x___ 1. Do not eat food after midnight the night before your procedure. NO GUM OR CANDY AFTER MIDNIGHT. You may drink WATER up to 2 hours before you are scheduled to arrive at the hospital for your procedure.  Do not drink WATER within 2 hours of your scheduled arrival to the hospital.  Type 1 and type 2 diabetics should only drink water.   ____Ensure clear carbohydrate drink on the way to the hospital for bariatric patients  ____Ensure clear carbohydrate drink 3 hours before surgery.    __x__ 2. No Alcohol for 24 hours before or after surgery.   __x__3. No Smoking or e-cigarettes for 24 prior to surgery.  Do not use any chewable tobacco products for at least 6 hour prior to surgery   ____  4. Bring all medications with you on the day of surgery if instructed.    __x__ 5. Notify your doctor if there is any change in your medical condition     (cold, fever, infections).    x___6. On the morning of surgery brush your teeth with toothpaste and water.  You may rinse your mouth with mouth wash if you wish.  Do not swallow any toothpaste or mouthwash.   Do not wear jewelry, make-up, hairpins, clips or nail polish.  Do not wear lotions, powders, or perfumes. You may wear deodorant.  Do not shave 48 hours prior to surgery. Men may shave face and neck.  Do not bring valuables to the hospital.    [facility] is not responsible for any belongings or valuables.               Contacts, dentures or bridgework may not be worn into surgery.  Leave your suitcase in the car. After surgery it may be brought to your room.  For patients admitted to the hospital, discharge time is determined  by your treatment team.  _  Patients discharged the day of surgery will not be allowed to drive home.  You will need someone to drive you home and stay with you the night of your procedure.    Please read over the following fact sheets that you were given:   [facility] Preparing for Surgery   _x___ TAKE THE FOLLOWING MEDICATION THE MORNING OF SURGERY WITH A SMALL SIP OF WATER. These include:  1. NORVASC (AMLODIPINE)  2. PEPCID (FAMOTIDINE)  3. TAKE AN EXTRA PEPCID BEFORE BED THE NIGHT BEFORE YOUR SURGERY  4.  5.  6.  ____Fleets enema or Magnesium Citrate as directed.   _x___ Use CHG Soap or sage wipes as directed on instruction sheet   ____ Use inhalers on the day of surgery and bring to hospital day of surgery  _X___ Stop Metformin 2 days prior to surgery-LAST DOSE ON Saturday 05-09-19   ____ Take 1/2 of usual insulin dose the night before surgery and none on the morning surgery.   _x___ Follow recommendations from Cardiologist, Pulmonologist or PCP regarding stopping Aspirin, Coumadin, Plavix ,Eliquis, Effient, or Pradaxa, and Pletal.  X____Stop Anti-inflammatories such as Advil, Aleve, Ibuprofen, Motrin, Naproxen, Naprosyn, Goodies powders or aspirin products NOW-OK to take Tylenol    _x___ Stop supplements until after surgery-STOP  YOUR OMEGA 3 NOW-YOU MAY RESUME AFTER SURGERY   ____ Bring C-Pap to the hospital.

## 2019-05-05 ENCOUNTER — Other Ambulatory Visit: Payer: Medicare Other

## 2019-05-05 ENCOUNTER — Ambulatory Visit: Payer: Medicare Other | Admitting: Physical Therapy

## 2019-05-06 ENCOUNTER — Inpatient Hospital Stay: Admission: RE | Admit: 2019-05-06 | Payer: Medicare Other | Source: Ambulatory Visit

## 2019-05-07 ENCOUNTER — Ambulatory Visit: Payer: Medicare Other | Admitting: Physical Therapy

## 2019-05-07 ENCOUNTER — Encounter
Admission: RE | Admit: 2019-05-07 | Discharge: 2019-05-07 | Disposition: A | Discharge status: Home / Self Care | Payer: Medicare Other | Source: Ambulatory Visit | Attending: Surgery | Admitting: Surgery

## 2019-05-07 ENCOUNTER — Ambulatory Visit: Payer: Medicare Other

## 2019-05-07 ENCOUNTER — Other Ambulatory Visit: Payer: Self-pay

## 2019-05-07 DIAGNOSIS — Z01818 Encounter for other preprocedural examination: Secondary | ICD-10-CM | POA: Diagnosis not present

## 2019-05-07 DIAGNOSIS — E119 Type 2 diabetes mellitus without complications: Secondary | ICD-10-CM | POA: Diagnosis not present

## 2019-05-07 DIAGNOSIS — I10 Essential (primary) hypertension: Secondary | ICD-10-CM | POA: Diagnosis not present

## 2019-05-08 ENCOUNTER — Other Ambulatory Visit
Admission: RE | Admit: 2019-05-08 | Discharge: 2019-05-08 | Disposition: A | Discharge status: Home / Self Care | Payer: Medicare Other | Source: Ambulatory Visit | Attending: Surgery | Admitting: Surgery

## 2019-05-08 ENCOUNTER — Other Ambulatory Visit: Payer: Self-pay | Admitting: Family Medicine

## 2019-05-08 DIAGNOSIS — R790 Abnormal level of blood mineral: Secondary | ICD-10-CM

## 2019-05-08 DIAGNOSIS — Z01812 Encounter for preprocedural laboratory examination: Secondary | ICD-10-CM | POA: Diagnosis not present

## 2019-05-08 DIAGNOSIS — Z20822 Contact with and (suspected) exposure to covid-19: Secondary | ICD-10-CM | POA: Diagnosis not present

## 2019-05-08 DIAGNOSIS — E782 Mixed hyperlipidemia: Secondary | ICD-10-CM

## 2019-05-08 DIAGNOSIS — D649 Anemia, unspecified: Secondary | ICD-10-CM

## 2019-05-08 LAB — SARS CORONAVIRUS 2 (TAT 6-24 HRS): SARS Coronavirus 2: NEGATIVE

## 2019-05-08 NOTE — Pre-Procedure Instructions (Addendum)
Taylor [name]: PPt having lumpectomy with Dr Hampton Abbot 3-23. Has htn and dm. Ekg done today is abnormal. Muse is interpreting Ekg as no sicnigicant change from her 2017 Ekg. Can you review EKG from today and compare it to her 2017 Ekg and let me know if we need clearance?    Dr Rosey Bath: I think it looks about the same as previous. If not cardiac symptoms and good METs we are good to proceed.

## 2019-05-11 ENCOUNTER — Encounter: Payer: Self-pay | Admitting: Anesthesiology

## 2019-05-11 ENCOUNTER — Other Ambulatory Visit: Payer: Self-pay

## 2019-05-11 ENCOUNTER — Ambulatory Visit: Payer: Medicare Other | Attending: Anesthesiology | Admitting: Anesthesiology

## 2019-05-11 DIAGNOSIS — M545 Low back pain, unspecified: Secondary | ICD-10-CM

## 2019-05-11 DIAGNOSIS — M79642 Pain in left hand: Secondary | ICD-10-CM

## 2019-05-11 DIAGNOSIS — M79641 Pain in right hand: Secondary | ICD-10-CM | POA: Diagnosis not present

## 2019-05-11 DIAGNOSIS — Z79891 Long term (current) use of opiate analgesic: Secondary | ICD-10-CM

## 2019-05-11 DIAGNOSIS — M25562 Pain in left knee: Secondary | ICD-10-CM

## 2019-05-11 DIAGNOSIS — G894 Chronic pain syndrome: Secondary | ICD-10-CM

## 2019-05-11 DIAGNOSIS — M159 Polyosteoarthritis, unspecified: Secondary | ICD-10-CM

## 2019-05-11 DIAGNOSIS — M25561 Pain in right knee: Secondary | ICD-10-CM

## 2019-05-11 DIAGNOSIS — G8929 Other chronic pain: Secondary | ICD-10-CM

## 2019-05-11 MED ORDER — OXYCODONE-ACETAMINOPHEN 7.5-325 MG PO TABS: 1.0000 | ORAL_TABLET | Freq: Three times a day (TID) | ORAL | 0 refills | Status: AC | PRN

## 2019-05-12 ENCOUNTER — Other Ambulatory Visit: Payer: Self-pay | Admitting: Surgery

## 2019-05-12 ENCOUNTER — Ambulatory Visit
Admission: RE | Admit: 2019-05-12 | Discharge: 2019-05-12 | Disposition: A | Discharge status: Home / Self Care | Payer: Medicare Other | Source: Ambulatory Visit | Attending: Surgery | Admitting: Surgery

## 2019-05-12 ENCOUNTER — Encounter
Admission: RE | Disposition: A | Discharge status: Home / Self Care | Payer: Self-pay | Source: Home / Self Care | Attending: Surgery

## 2019-05-12 ENCOUNTER — Ambulatory Visit
Admission: RE | Admit: 2019-05-12 | Discharge: 2019-05-12 | Disposition: A | Discharge status: Home / Self Care | Payer: Medicare Other | Attending: Surgery | Admitting: Surgery

## 2019-05-12 ENCOUNTER — Ambulatory Visit: Payer: Medicare Other | Admitting: Anesthesiology

## 2019-05-12 ENCOUNTER — Encounter: Payer: Self-pay | Admitting: Surgery

## 2019-05-12 ENCOUNTER — Ambulatory Visit: Payer: Medicare Other | Admitting: Physical Therapy

## 2019-05-12 DIAGNOSIS — D241 Benign neoplasm of right breast: Secondary | ICD-10-CM | POA: Insufficient documentation

## 2019-05-12 DIAGNOSIS — N6310 Unspecified lump in the right breast, unspecified quadrant: Secondary | ICD-10-CM

## 2019-05-12 DIAGNOSIS — E785 Hyperlipidemia, unspecified: Secondary | ICD-10-CM | POA: Insufficient documentation

## 2019-05-12 DIAGNOSIS — Z9889 Other specified postprocedural states: Secondary | ICD-10-CM

## 2019-05-12 DIAGNOSIS — Z79899 Other long term (current) drug therapy: Secondary | ICD-10-CM | POA: Insufficient documentation

## 2019-05-12 DIAGNOSIS — Z7984 Long term (current) use of oral hypoglycemic drugs: Secondary | ICD-10-CM | POA: Diagnosis not present

## 2019-05-12 DIAGNOSIS — I129 Hypertensive chronic kidney disease with stage 1 through stage 4 chronic kidney disease, or unspecified chronic kidney disease: Secondary | ICD-10-CM | POA: Diagnosis not present

## 2019-05-12 DIAGNOSIS — K219 Gastro-esophageal reflux disease without esophagitis: Secondary | ICD-10-CM | POA: Insufficient documentation

## 2019-05-12 DIAGNOSIS — N183 Chronic kidney disease, stage 3 unspecified: Secondary | ICD-10-CM | POA: Diagnosis not present

## 2019-05-12 DIAGNOSIS — M810 Age-related osteoporosis without current pathological fracture: Secondary | ICD-10-CM | POA: Diagnosis not present

## 2019-05-12 DIAGNOSIS — N6081 Other benign mammary dysplasias of right breast: Secondary | ICD-10-CM | POA: Diagnosis not present

## 2019-05-12 DIAGNOSIS — E1122 Type 2 diabetes mellitus with diabetic chronic kidney disease: Secondary | ICD-10-CM | POA: Diagnosis not present

## 2019-05-12 DIAGNOSIS — I10 Essential (primary) hypertension: Secondary | ICD-10-CM | POA: Insufficient documentation

## 2019-05-12 DIAGNOSIS — N6315 Unspecified lump in the right breast, overlapping quadrants: Secondary | ICD-10-CM | POA: Diagnosis not present

## 2019-05-12 DIAGNOSIS — Z951 Presence of aortocoronary bypass graft: Secondary | ICD-10-CM | POA: Insufficient documentation

## 2019-05-12 DIAGNOSIS — E119 Type 2 diabetes mellitus without complications: Secondary | ICD-10-CM | POA: Insufficient documentation

## 2019-05-12 DIAGNOSIS — E669 Obesity, unspecified: Secondary | ICD-10-CM | POA: Insufficient documentation

## 2019-05-12 HISTORY — PX: BREAST LUMPECTOMY WITH NEEDLE LOCALIZATION: SHX5759

## 2019-05-12 LAB — GLUCOSE, CAPILLARY
Glucose-Capillary: 116 mg/dL — ABNORMAL HIGH (ref 70–99)
Glucose-Capillary: 104 mg/dL — ABNORMAL HIGH (ref 70–99)

## 2019-05-12 SURGERY — BREAST LUMPECTOMY WITH NEEDLE LOCALIZATION
Anesthesia: General | Site: Breast | Laterality: Right

## 2019-05-12 MED ORDER — METOPROLOL TARTRATE 5 MG/5ML IV SOLN: 2.0000 mg | Freq: Once | INTRAVENOUS | Status: AC

## 2019-05-12 MED ORDER — CEFAZOLIN SODIUM-DEXTROSE 2-4 GM/100ML-% IV SOLN
2.0000 g | INTRAVENOUS | Status: AC
  Administered 2019-05-12: 2 g via INTRAVENOUS

## 2019-05-12 MED ORDER — EPHEDRINE SULFATE 50 MG/ML IJ SOLN
INTRAMUSCULAR | Status: DC | PRN
  Administered 2019-05-12: 10 mg via INTRAVENOUS

## 2019-05-12 MED ORDER — DEXAMETHASONE SODIUM PHOSPHATE 10 MG/ML IJ SOLN
INTRAMUSCULAR | Status: DC | PRN
  Administered 2019-05-12: 10 mg via INTRAVENOUS

## 2019-05-12 MED ORDER — SODIUM CHLORIDE 0.9 % IV SOLN: INTRAVENOUS | Status: DC

## 2019-05-12 MED ORDER — PROPOFOL 10 MG/ML IV BOLUS
INTRAVENOUS | Status: DC | PRN
  Administered 2019-05-12: 150 mg via INTRAVENOUS

## 2019-05-12 MED ORDER — LIDOCAINE HCL (CARDIAC) PF 100 MG/5ML IV SOSY
PREFILLED_SYRINGE | INTRAVENOUS | Status: DC | PRN
  Administered 2019-05-12: 100 mg via INTRAVENOUS

## 2019-05-12 MED ORDER — BUPIVACAINE-EPINEPHRINE 0.5% -1:200000 IJ SOLN
INTRAMUSCULAR | Status: DC | PRN
  Administered 2019-05-12: 30 mL

## 2019-05-12 MED ORDER — OXYCODONE HCL 5 MG PO TABS
ORAL_TABLET | ORAL | Status: AC
  Filled 2019-05-12: qty 1

## 2019-05-12 MED ORDER — FENTANYL CITRATE (PF) 100 MCG/2ML IJ SOLN
INTRAMUSCULAR | Status: AC
  Administered 2019-05-12: 25 ug via INTRAVENOUS
  Filled 2019-05-12: qty 2

## 2019-05-12 MED ORDER — ONDANSETRON HCL 4 MG/2ML IJ SOLN
INTRAMUSCULAR | Status: DC | PRN
  Administered 2019-05-12: 4 mg via INTRAVENOUS

## 2019-05-12 MED ORDER — FENTANYL CITRATE (PF) 100 MCG/2ML IJ SOLN
INTRAMUSCULAR | Status: DC | PRN
  Administered 2019-05-12: 50 ug via INTRAVENOUS
  Administered 2019-05-12 (×2): 25 ug via INTRAVENOUS

## 2019-05-12 MED ORDER — BUPIVACAINE HCL (PF) 0.5 % IJ SOLN
INTRAMUSCULAR | Status: AC
  Filled 2019-05-12: qty 30

## 2019-05-12 MED ORDER — LACTATED RINGERS IV SOLN: INTRAVENOUS | Status: DC | PRN

## 2019-05-12 MED ORDER — IBUPROFEN 600 MG PO TABS: 600.0000 mg | ORAL_TABLET | Freq: Three times a day (TID) | ORAL | 0 refills | Status: DC | PRN

## 2019-05-12 MED ORDER — CHLORHEXIDINE GLUCONATE CLOTH 2 % EX PADS
6.0000 | MEDICATED_PAD | Freq: Once | CUTANEOUS | Status: AC
  Administered 2019-05-12: 6 via TOPICAL

## 2019-05-12 MED ORDER — METOPROLOL TARTRATE 5 MG/5ML IV SOLN
INTRAVENOUS | Status: AC
  Administered 2019-05-12: 2 mg via INTRAVENOUS
  Filled 2019-05-12: qty 5

## 2019-05-12 MED ORDER — EPINEPHRINE PF 1 MG/ML IJ SOLN
INTRAMUSCULAR | Status: AC
  Filled 2019-05-12: qty 1

## 2019-05-12 MED ORDER — ACETAMINOPHEN 500 MG PO TABS
1000.0000 mg | ORAL_TABLET | ORAL | Status: AC
  Administered 2019-05-12: 1000 mg via ORAL

## 2019-05-12 MED ORDER — METHYLENE BLUE 0.5 % INJ SOLN
INTRAVENOUS | Status: AC
  Filled 2019-05-12: qty 10

## 2019-05-12 MED ORDER — ACETAMINOPHEN 500 MG PO TABS
ORAL_TABLET | ORAL | Status: AC
  Filled 2019-05-12: qty 2

## 2019-05-12 MED ORDER — SODIUM CHLORIDE (PF) 0.9 % IJ SOLN
INTRAMUSCULAR | Status: AC
  Filled 2019-05-12: qty 10

## 2019-05-12 MED ORDER — OXYCODONE HCL 5 MG PO TABS
5.0000 mg | ORAL_TABLET | Freq: Once | ORAL | Status: AC
  Administered 2019-05-12: 5 mg via ORAL

## 2019-05-12 MED ORDER — ONDANSETRON HCL 4 MG/2ML IJ SOLN: 4.0000 mg | Freq: Once | INTRAMUSCULAR | Status: DC | PRN

## 2019-05-12 MED ORDER — CEFAZOLIN SODIUM-DEXTROSE 2-4 GM/100ML-% IV SOLN
INTRAVENOUS | Status: AC
  Filled 2019-05-12: qty 100

## 2019-05-12 MED ORDER — OXYCODONE HCL 5 MG PO TABS: 5.0000 mg | ORAL_TABLET | ORAL | 0 refills | Status: DC | PRN

## 2019-05-12 MED ORDER — FENTANYL CITRATE (PF) 100 MCG/2ML IJ SOLN
25.0000 ug | INTRAMUSCULAR | Status: DC | PRN
  Administered 2019-05-12 (×2): 25 ug via INTRAVENOUS

## 2019-05-12 SURGICAL SUPPLY — 51 items
APPLIER CLIP 9.375 SM OPEN (CLIP)
BINDER BREAST LRG (GAUZE/BANDAGES/DRESSINGS) IMPLANT
BINDER BREAST MEDIUM (GAUZE/BANDAGES/DRESSINGS) IMPLANT
BLADE PHOTON ILLUMINATED (MISCELLANEOUS) ×1 IMPLANT
BLADE SURG 15 STRL LF DISP TIS (BLADE) ×2 IMPLANT
BLADE SURG 15 STRL SS (BLADE) ×2
CANISTER SUCT 1200ML W/VALVE (MISCELLANEOUS) ×2 IMPLANT
CHLORAPREP W/TINT 26 (MISCELLANEOUS) ×2 IMPLANT
CLIP APPLIE 9.375 SM OPEN (CLIP) IMPLANT
CNTNR SPEC 2.5X3XGRAD LEK (MISCELLANEOUS) ×1
CONT SPEC 4OZ STER OR WHT (MISCELLANEOUS) ×1
CONTAINER SPEC 2.5X3XGRAD LEK (MISCELLANEOUS) ×1 IMPLANT
COVER PROBE FLX POLY STRL (MISCELLANEOUS) ×2 IMPLANT
COVER WAND RF STERILE (DRAPES) IMPLANT
DERMABOND ADVANCED (GAUZE/BANDAGES/DRESSINGS) ×2
DERMABOND ADVANCED .7 DNX12 (GAUZE/BANDAGES/DRESSINGS) ×1 IMPLANT
DEVICE DUBIN SPECIMEN MAMMOGRA (MISCELLANEOUS) ×2 IMPLANT
DRAPE LAPAROTOMY 100X77 ABD (DRAPES) ×2 IMPLANT
DRSG GAUZE FLUFF 36X18 (GAUZE/BANDAGES/DRESSINGS) ×2 IMPLANT
ELECT CAUTERY BLADE 6.4 (BLADE) ×2 IMPLANT
ELECT REM PT RETURN 9FT ADLT (ELECTROSURGICAL) ×2
ELECTRODE REM PT RTRN 9FT ADLT (ELECTROSURGICAL) ×1 IMPLANT
GLOVE SURG SYN 7.0 (GLOVE) ×2 IMPLANT
GLOVE SURG SYN 7.0 PF PI (GLOVE) ×1 IMPLANT
GLOVE SURG SYN 7.5  E (GLOVE) ×1
GLOVE SURG SYN 7.5 E (GLOVE) ×1 IMPLANT
GLOVE SURG SYN 7.5 PF PI (GLOVE) ×1 IMPLANT
GOWN STRL REUS W/ TWL LRG LVL3 (GOWN DISPOSABLE) ×2 IMPLANT
GOWN STRL REUS W/TWL LRG LVL3 (GOWN DISPOSABLE) ×2
KIT MARKER MARGIN INK (KITS) ×2 IMPLANT
KIT TURNOVER KIT A (KITS) ×2 IMPLANT
LABEL OR SOLS (LABEL) ×2 IMPLANT
MARGIN MAP 10MM (MISCELLANEOUS) IMPLANT
MARKER MARGIN CORRECT CLIP (MARKER) ×2 IMPLANT
NDL HYPO 25X1 1.5 SAFETY (NEEDLE) ×1 IMPLANT
NEEDLE HYPO 22GX1.5 SAFETY (NEEDLE) ×2 IMPLANT
NEEDLE HYPO 25X1 1.5 SAFETY (NEEDLE) ×2 IMPLANT
PACK BASIN MINOR ARMC (MISCELLANEOUS) ×2 IMPLANT
SLEVE PROBE SENORX GAMMA FIND (MISCELLANEOUS) ×1 IMPLANT
SUT ETHILON 3-0 FS-10 30 BLK (SUTURE) ×2
SUT MNCRL 4-0 (SUTURE) ×1
SUT MNCRL 4-0 27XMFL (SUTURE) ×1
SUT SILK 3 0 SH 30 (SUTURE) ×2 IMPLANT
SUT VIC AB 3-0 SH 27 (SUTURE) ×2
SUT VIC AB 3-0 SH 27X BRD (SUTURE) ×1 IMPLANT
SUTURE EHLN 3-0 FS-10 30 BLK (SUTURE) ×1 IMPLANT
SUTURE MNCRL 4-0 27XMF (SUTURE) ×1 IMPLANT
SYR 10ML LL (SYRINGE) ×2 IMPLANT
SYR BULB IRRIG 60ML STRL (SYRINGE) ×2 IMPLANT
TAPE TRANSPORE STRL 2 31045 (GAUZE/BANDAGES/DRESSINGS) IMPLANT
WATER STERILE IRR 1000ML POUR (IV SOLUTION) ×2 IMPLANT

## 2019-05-12 NOTE — Transfer of Care (Signed)
Immediate Anesthesia Transfer of Care Note  Patient: Taylor [name]  Procedure(s) Performed: BREAST LUMPECTOMY WITH NEEDLE LOCALIZATION (Right Breast)  Patient Location: PACU  Anesthesia Type:General  Level of Consciousness: sedated  Airway & Oxygen Therapy: Patient Spontanous Breathing and Patient connected to face mask oxygen  Post-op Assessment: Report given to RN and Post -op Vital signs reviewed and stable  Post vital signs: Reviewed and stable  Last Vitals:  Vitals Value Taken Time  BP 149/71 05/12/19 1728  Temp 36.4 C 05/12/19 1728  Pulse 75 05/12/19 1728  Resp 20 05/12/19 1728  SpO2 100 % 05/12/19 1728  Vitals shown include unvalidated device data.  Last Pain:  Vitals:   05/12/19 1010  TempSrc: Tympanic  PainSc: 6          Complications: No apparent anesthesia complications

## 2019-05-12 NOTE — Anesthesia Preprocedure Evaluation (Signed)
Anesthesia Evaluation  Patient identified by MRN, date of birth, ID band Patient awake    Reviewed: Allergy & Precautions, H&P , NPO status , Patient's Chart, lab work & pertinent test results, reviewed documented beta blocker date and time   Airway Mallampati: II  TM Distance: >3 FB Neck ROM: full    Dental  (+) Teeth Intact   Pulmonary neg pulmonary ROS,    Pulmonary exam normal        Cardiovascular Exercise Tolerance: Good hypertension, On Medications negative cardio ROS Normal cardiovascular exam+ dysrhythmias  Rate:Normal     Neuro/Psych negative neurological ROS  negative psych ROS   GI/Hepatic negative GI ROS, Neg liver ROS, GERD  Medicated,  Endo/Other  negative endocrine ROSdiabetes  Renal/GU Renal diseasenegative Renal ROS  negative genitourinary   Musculoskeletal   Abdominal   Peds  Hematology negative hematology ROS (+) Blood dyscrasia, anemia ,   Anesthesia Other Findings   Reproductive/Obstetrics negative OB ROS                             Anesthesia Physical Anesthesia Plan  ASA: III  Anesthesia Plan: General LMA   Post-op Pain Management:    Induction:   PONV Risk Score and Plan: 4 or greater  Airway Management Planned:   Additional Equipment:   Intra-op Plan:   Post-operative Plan:   Informed Consent: I have reviewed the patients History and Physical, chart, labs and discussed the procedure including the risks, benefits and alternatives for the proposed anesthesia with the patient or authorized representative who has indicated his/her understanding and acceptance.       Plan Discussed with: CRNA  Anesthesia Plan Comments:         Anesthesia Quick Evaluation

## 2019-05-12 NOTE — Anesthesia Postprocedure Evaluation (Signed)
Anesthesia Post Note  Patient: Taylor [name]  Procedure(s) Performed: BREAST LUMPECTOMY WITH NEEDLE LOCALIZATION (Right Breast)  Patient location during evaluation: PACU Anesthesia Type: General Level of consciousness: awake and alert Pain management: pain level controlled Vital Signs Assessment: post-procedure vital signs reviewed and stable Respiratory status: spontaneous breathing and respiratory function stable Cardiovascular status: stable Anesthetic complications: no     Last Vitals:  Vitals:   05/12/19 1845 05/12/19 1854  BP: (!) 153/72 (!) 163/77  Pulse: 77 74  Resp: 12 12  Temp:  36.6 C  SpO2: 98% 96%    Last Pain:  Vitals:   05/12/19 1854  TempSrc: Temporal  PainSc: 4                  Roberto Hlavaty K

## 2019-05-12 NOTE — Progress Notes (Signed)
Virtual Visit via Telephone Note  I connected with VALLEY GIOVE on 05/12/19 at  2:15 PM EDT by telephone and verified that I am speaking with the correct person using two identifiers.  Location: Patient: Home Provider: Pain control center   I discussed the limitations, risks, security and privacy concerns of performing an evaluation and management service by telephone and the availability of in person appointments. I also discussed with the patient that there may be a patient responsible charge related to this service. The patient expressed understanding and agreed to proceed.   History of Present Illness: I was able to reach [name] today for her virtual conference.  She was only able to do this via telephone and unable to do the video portion of this.  She mentions that her low back pain has been stable in nature.  No significant changes are reported.  She is taking her medications as prescribed and these are continue to work well for her.  The quality characteristic and distribution of her low back pain and leg pain is been stable.  She is remaining functional she reports.  Otherwise she is in her usual state of health at this time.    Observations/Objective: No current facility-administered medications for this visit. No current outpatient medications on file.  Facility-Administered Medications Ordered in Other Visits:  .  0.9 %  sodium chloride infusion, , Intravenous, Continuous, Piscitello, Precious Haws, MD, Last Rate: 50 mL/hr at 05/12/19 1016, New Bag at 05/12/19 1016 .  acetaminophen (TYLENOL) 500 MG tablet, , , ,  .  acetaminophen (TYLENOL) tablet 1,000 mg, 1,000 mg, Oral, On Call to OR, [place], Alberta, MD .  ceFAZolin (ANCEF) 2-4 GM/100ML-% IVPB, , , ,  .  ceFAZolin (ANCEF) IVPB 2g/100 mL premix, 2 g, Intravenous, On Call to [place], Olean Ree, MD  Assessment and Plan: 1. Chronic pain syndrome   2. Chronic pain of right knee   3. Pain in both hands   4. Chronic midline low  back pain without sciatica   5. Long term prescription opiate use   6. Chronic pain of both knees   7. Osteoarthritis of multiple joints, unspecified osteoarthritis type   I have reviewed the [facility] practitioner database information and it is appropriate.  As such and were to refill her medications for April and May.  I will schedule her for 62-month return to clinic.  I encouraged her to continue with stretching strengthening exercises.  She is due for a routine UDS which has been requested.  Of also encouraged her to continue follow-up with her primary care physicians for her baseline medical care  Follow Up Instructions:    I discussed the assessment and treatment plan with the patient. The patient was provided an opportunity to ask questions and all were answered. The patient agreed with the plan and demonstrated an understanding of the instructions.   The patient was advised to call back or seek an in-person evaluation if the symptoms worsen or if the condition fails to improve as anticipated.  I provided 30 minutes of non-face-to-face time during this encounter.   [name], MD

## 2019-05-12 NOTE — Anesthesia Procedure Notes (Signed)
Procedure Name: LMA Insertion Date/Time: 05/12/2019 4:00 PM Performed by: Allean Found, CRNA Pre-anesthesia Checklist: Patient identified, Patient being monitored, Timeout performed, Emergency Drugs available and Suction available Patient Re-evaluated:Patient Re-evaluated prior to induction Oxygen Delivery Method: Circle system utilized Preoxygenation: Pre-oxygenation with 100% oxygen Induction Type: IV induction Ventilation: Mask ventilation without difficulty LMA: LMA inserted LMA Size: 4.0 Tube type: Oral Number of attempts: 1 Placement Confirmation: positive ETCO2 and breath sounds checked- equal and bilateral Tube secured with: Tape Dental Injury: Teeth and Oropharynx as per pre-operative assessment

## 2019-05-12 NOTE — Interval H&P Note (Signed)
History and Physical Interval Note:  05/12/2019 3:13 PM  Taylor [name]  has presented today for surgery, with the diagnosis of right breast mass.  The various methods of treatment have been discussed with the patient and family. After consideration of risks, benefits and other options for treatment, the patient has consented to  Procedure(s): BREAST LUMPECTOMY WITH NEEDLE LOCALIZATION (Right) as a surgical intervention.  The patient's history has been reviewed, patient examined, no change in status, stable for surgery.  I have reviewed the patient's chart and labs.  Questions were answered to the patient's satisfaction.     Odell Fasching

## 2019-05-12 NOTE — Op Note (Signed)
  Procedure Date:  05/12/2019  Pre-operative Diagnosis:  Right breast mass  Post-operative Diagnosis:  Right breast mass  Procedure:  Right breast wire-localized lumpectomy  Surgeon:  Melvyn Neth, MD  Assistant:  Burke Keels, PA-S  Anesthesia:  General endotracheal  Estimated Blood Loss:  5 ml  Specimens:  Right breast mass  Complications:  None  Indications for Procedure:  This is a 86 y.o. female who presents with a right breast mass.  The risks of bleeding, infection, injury to surrounding structures, hematoma, seroma, open wound, cosmetic deformity, and the need for further surgery were all discussed with the patient and was willing to proceed.  Prior to this procedure, the patient had undergone wire localization as well as placement of an RFID tag.  Description of Procedure: The patient was correctly identified in the preoperative area and brought into the operating room.  The patient was placed supine with VTE prophylaxis in place.  Appropriate time-outs were performed.  Anesthesia was induced and the patient was intubated.  Appropriate antibiotics were infused.  The right chest and axilla were prepped and draped in usual sterile fashion.  Then using the hand-held probe the point closest to the tag was localized.  An incision was made encompassing the wire and centralized over the point closest to the tag.  Elecrocautery was used for dissection to perform a partial mastectomy with adequate margins.  The specimen was marked with MagicMarker and CorrectClips.  The specimen including the wire was sent to radiology which confirmed an intact wire and prior biopsy site clip as well as the RFID tag within the specimen.  The cavity was irrigated and hemostasis was assured with electrocautery.  Local anesthetic was infiltrated into the skin and subcutaneous tissue of the cavity.  The wound was then closed in two layers with 3-0 Vicryl and 4-0 Monocryl and sealed with DermaBond.  The  patient was emerged from anesthesia and extubated and brought to the recovery room for further management.  The patient tolerated the procedure well and all counts were correct at the end of the case.   Melvyn Neth, MD

## 2019-05-12 NOTE — Discharge Instructions (Signed)

## 2019-05-13 ENCOUNTER — Ambulatory Visit (INDEPENDENT_AMBULATORY_CARE_PROVIDER_SITE_OTHER): Payer: Medicare Other | Admitting: Gastroenterology

## 2019-05-13 DIAGNOSIS — D509 Iron deficiency anemia, unspecified: Secondary | ICD-10-CM

## 2019-05-13 NOTE — Addendum Note (Signed)
Addendum  created 05/13/19 0754 by Hedda Slade, CRNA   Intraprocedure Event edited

## 2019-05-13 NOTE — Progress Notes (Signed)
[name], MD [address]  [place]  [place], [place] [address]  Main: [phone]  Fax: [phone]   Primary Care Physician: [name], MD  Virtual Visit via Telephone Note  I connected with patient on 05/13/19 at  2:45 PM EDT by telephone and verified that I am speaking with the correct person using two identifiers.   I discussed the limitations, risks, security and privacy concerns of performing an evaluation and management service by telephone and the availability of in person appointments. I also discussed with the patient that there may be a patient responsible charge related to this service. The patient expressed understanding and agreed to proceed.  Location of Patient: Home Location of Provider: Home Persons involved: Patient and provider only during the visit (nursing staff and front desk staff was involved in communicating with the patient prior to the appointment, reviewing medications and checking them in)   History of Present Illness: Chief Complaint  Patient presents with  . Anemia    Denies any symptoms or blood in stools      HPI: KELSEY DURFLINGER is a 86 y.o. female here for follow-up of iron deficiency anemia. recently underwent breast lumpectomy yesterday with pathology pending. The patient denies abdominal or flank pain, anorexia, nausea or vomiting, dysphagia, change in bowel habits or black or bloody stools or weight loss.  Current Outpatient Medications  Medication Sig Dispense Refill  . acetaminophen (TYLENOL) 500 MG tablet Take 1 tablet (500 mg total) by mouth 3 (three) times daily. (Patient taking differently: Take 500 mg by mouth every 6 (six) hours as needed for moderate pain or headache. ) 90 tablet 0  . amLODipine (NORVASC) 10 MG tablet TAKE 1 TABLET BY MOUTH EVERY DAY (Patient taking differently: Take 10 mg by mouth every morning. ) 90 tablet 1  . AZOPT 1 % ophthalmic suspension Place 1 drop into both eyes in the morning and  at bedtime.     . bimatoprost (LUMIGAN) 0.01 % SOLN Place 1 drop into both eyes at bedtime.     . brimonidine-timolol (COMBIGAN) 0.2-0.5 % ophthalmic solution Place 1 drop into both eyes 2 (two) times daily.     . Calcium Carbonate-Vitamin D (OYSTER SHELL CALCIUM 500 + D PO) Take 1 tablet by mouth 2 (two) times a day.    . famotidine (PEPCID) 20 MG tablet TAKE 1 TABLET BY MOUTH EVERY DAY (Patient taking differently: Take 20 mg by mouth every morning. ) 90 tablet 1  . ferrous sulfate 325 (65 FE) MG tablet TAKE 1 TABLET BY MOUTH EVERY DAY WITH BREAKFAST 90 tablet 1  . fluticasone (FLONASE) 50 MCG/ACT nasal spray SPRAY 2 SPRAYS INTO EACH NOSTRIL EVERY DAY (Patient taking differently: Place 1 spray into both nostrils daily as needed for allergies. ) 16 g 1  . ibuprofen (ADVIL) 600 MG tablet Take 1 tablet (600 mg total) by mouth every 8 (eight) hours as needed for mild pain or moderate pain. 30 tablet 0  . metFORMIN (GLUCOPHAGE-XR) 750 MG 24 hr tablet Take 1 tablet (750 mg total) by mouth daily with breakfast. 90 tablet 0  . olmesartan (BENICAR) 20 MG tablet Take 1 tablet (20 mg total) by mouth daily. (Patient taking differently: Take 20 mg by mouth every morning. ) 90 tablet 0  . omega-3 acid ethyl esters (LOVAZA) 1 g capsule TAKE 1 CAPSULE (1 G TOTAL) BY MOUTH 2 (TWO) TIMES DAILY. 180 capsule 0  . OneTouch Delica Lancets 86P MISC 1 each  by Does not apply route daily. 100 each 1  . ONETOUCH ULTRA test strip USE AS DIRECTED 100 strip 12  . oxyCODONE (OXY IR/ROXICODONE) 5 MG immediate release tablet Take 1 tablet (5 mg total) by mouth every 4 (four) hours as needed for severe pain. 30 tablet 0  . [START ON 06/02/2019] oxyCODONE-acetaminophen (PERCOCET) 7.5-325 MG tablet Take 1 tablet by mouth every 8 (eight) hours as needed for moderate pain or severe pain. 90 tablet 0  . [START ON 07/02/2019] oxyCODONE-acetaminophen (PERCOCET) 7.5-325 MG tablet Take 1 tablet by mouth every 8 (eight) hours as needed for  moderate pain or severe pain. 90 tablet 0  . pravastatin (PRAVACHOL) 40 MG tablet Take 1 tablet (40 mg total) by mouth daily. (Patient taking differently: Take 40 mg by mouth at bedtime. ) 90 tablet 1  . Vitamin D, Ergocalciferol, (DRISDOL) 1.25 MG (50000 UNIT) CAPS capsule Take 50,000 Units by mouth every Wednesday.     No current facility-administered medications for this visit.    Allergies as of 05/13/2019 - Review Complete 05/13/2019  Allergen Reaction Noted  . Ace inhibitors Cough 04/03/2017    Review of Systems:    All systems reviewed and negative except where noted in HPI.   Observations/Objective:  Labs: CMP     Component Value Date/Time   NA 140 04/17/2019 1512   NA 141 08/09/2015 0921   K 4.5 04/17/2019 1512   CL 109 04/17/2019 1512   CO2 21 04/17/2019 1512   GLUCOSE 114 (H) 04/17/2019 1512   BUN 22 04/17/2019 1512   BUN 22 08/09/2015 0921   CREATININE 1.21 (H) 04/17/2019 1512   CALCIUM 10.0 04/17/2019 1512   PROT 7.2 04/17/2019 1512   PROT 6.5 08/09/2015 0921   ALBUMIN 4.0 08/14/2016 1012   ALBUMIN 4.1 08/09/2015 0921   AST 11 04/17/2019 1512   ALT 9 04/17/2019 1512   ALKPHOS 56 08/14/2016 1012   BILITOT 0.3 04/17/2019 1512   BILITOT 0.3 08/09/2015 0921   GFRNONAA 41 (L) 04/17/2019 1512   GFRAA 48 (L) 04/17/2019 1512   Lab Results  Component Value Date   WBC 7.3 04/17/2019   HGB 12.1 04/17/2019   HCT 37.1 04/17/2019   MCV 92.1 04/17/2019   PLT 332 04/17/2019    Imaging Studies: MM Breast Surgical Specimen  Result Date: 05/13/2019 CLINICAL DATA:  Status post wire and radiofrequency localized right breast lumpectomy. EXAM: SPECIMEN RADIOGRAPH OF THE RIGHT BREAST COMPARISON:  Previous exam(s). FINDINGS: Status post excision of the right breast. The radiofrequency reflector and wire as well as the heart shaped clip are present within the specimen. IMPRESSION: Specimen radiograph of the right breast. Electronically Signed   By: Lajean Manes M.D.   On:  05/13/2019 13:37   MM DIAG BREAST TOMO UNI RIGHT  Result Date: 05/12/2019 CLINICAL DATA:  Status post Hologic radiofrequency localization as well as standard wire localization of a right breast mass.Post procedure imaging to confirm placement the localization devices. EXAM: DIAGNOSTIC RIGHT MAMMOGRAM POST ULTRASOUND-GUIDED HOLOGIC RADIOFREQUENCY LOCALIZATION DEVICE AND STANDARD KOPAN'S WIRE NEEDLE PLACEMENT. COMPARISON:  Previous exam(s). FINDINGS: Mammographic images were obtained following ultrasound-guided placement of a Hologic radioactive reflector as well as a standard wire localization device. These demonstrate the radiofrequency reflector and the wire lie directly adjacent to the post biopsy marker clip. IMPRESSION: Appropriate location of the Hologic radiofrequency reflector and the localization wire within the right breast. Final Assessment: Post Procedure Mammograms for radiofrequency localizer and wire placement. Electronically Signed   By:  Lajean Manes M.D.   On: 05/12/2019 09:35   [place] RT PLC BREAST LOC DEV   1ST LESION  INC US GUIDE  Result Date: 05/12/2019 CLINICAL DATA:  Patient presents for ultrasound-guided localization of a discordant right breast lesion. The lesion will be localized with a radiofrequency capsule as well as a standard wire/needle combination. EXAM: NEEDLE LOCALIZATION OF THE RIGHT BREAST WITH ULTRASOUND GUIDANCE COMPARISON:  Previous exams. FINDINGS: Patient presents for needle localization prior to surgical excision. I met with the patient and we discussed the procedure of needle localization including benefits and alternatives. We discussed the high likelihood of a successful procedure. We discussed the risks of the procedure, including infection, bleeding, tissue injury, and further surgery. Informed, written consent was given. The usual time-out protocol was performed immediately prior to the procedure. Using ultrasound guidance, sterile technique, 1% lidocaine and a 7  cm Hologic needle, the small mass and associated biopsy clip in the 9 o'clock position of the right breast was localized using a lateral approach. Reflector function was confirmed with an auditory signal. Following this, the lesion was also localized using a 5 cm modified Kopan's wire. The images were marked for Piscoya. Two view postprocedure mammograms were obtained to confirm position of the Hologic radiofrequency reflector and the Kopan's wire. IMPRESSION: Radar reflector localization of the right breast. No apparent complications. Electronically Signed   By: Lajean Manes M.D.   On: 05/12/2019 09:30    Assessment and Plan:   JENNFER GASSEN is a 86 y.o. y/o female here for follow-up of iron deficiency anemia  Assessment and Plan: Await pathology results from recent breast lumpectomy  If etiology of iron deficiency anemia not evident after pathology results, proceed with EGD and colonoscopy for iron deficiency anemia  Follow-up in 2 weeks to discuss next steps  Follow Up Instructions:    I discussed the assessment and treatment plan with the patient. The patient was provided an opportunity to ask questions and all were answered. The patient agreed with the plan and demonstrated an understanding of the instructions.   The patient was advised to call back or seek an in-person evaluation if the symptoms worsen or if the condition fails to improve as anticipated.  I provided 10 minutes of non-face-to-face time during this encounter. Additional time was spent in reviewing patient's chart, placing orders etc.   Virgel Manifold, MD  Speech recognition software was used to dictate this note.

## 2019-05-14 ENCOUNTER — Other Ambulatory Visit: Payer: Self-pay | Admitting: Family Medicine

## 2019-05-14 ENCOUNTER — Ambulatory Visit: Payer: Medicare Other | Admitting: Physical Therapy

## 2019-05-14 LAB — SURGICAL PATHOLOGY

## 2019-05-15 LAB — TOXASSURE SELECT 13 (MW), URINE

## 2019-05-18 NOTE — Progress Notes (Signed)
05/18/19  Called patient and discussed pathology results with her.  Patient is doing well, with some occasional soreness but no worsening pain.  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  Olean Ree, MD

## 2019-05-19 ENCOUNTER — Ambulatory Visit: Payer: Medicare Other | Admitting: Physical Therapy

## 2019-05-21 ENCOUNTER — Ambulatory Visit: Payer: Medicare Other | Admitting: Physical Therapy

## 2019-05-25 ENCOUNTER — Encounter: Payer: Self-pay | Admitting: Surgery

## 2019-05-25 ENCOUNTER — Other Ambulatory Visit: Payer: Self-pay

## 2019-05-25 ENCOUNTER — Ambulatory Visit (INDEPENDENT_AMBULATORY_CARE_PROVIDER_SITE_OTHER): Payer: Self-pay | Admitting: Surgery

## 2019-05-25 VITALS — BP 125/76 | HR 71 | Temp 97.2°F | Ht 61.0 in | Wt 190.4 lb

## 2019-05-25 DIAGNOSIS — Z09 Encounter for follow-up examination after completed treatment for conditions other than malignant neoplasm: Secondary | ICD-10-CM

## 2019-05-25 DIAGNOSIS — N6310 Unspecified lump in the right breast, unspecified quadrant: Secondary | ICD-10-CM

## 2019-05-25 NOTE — Patient Instructions (Addendum)
Dr.Piscoya advised patient she may STOP wearing the Binder due to too much discomfort.  Dr.Piscoya suggested to relieve any discomfort, she may resume with wearing a normal bra. Patient will follow up in four months with an office visit along with Mammogram.   Lumpectomy, Care After This sheet gives you information about how to care for yourself after your procedure. Your health care provider may also give you more specific instructions. If you have problems or questions, contact your health care provider. What can I expect after the procedure? After the procedure, it is common to have:  Breast swelling.  Breast tenderness.  Stiffness in your arm or shoulder.  A change in the shape and feel of your breast.  Scar tissue that feels hard to the touch in the area where the lump was removed. Follow these instructions at home: Medicines  Take over-the-counter and prescription medicines only as told by your health care provider.  If you were prescribed an antibiotic medicine, take it as told by your health care provider. Do not stop taking the antibiotic even if you start to feel better.  Ask your health care provider if the medicine prescribed to you: ? Requires you to avoid driving or using heavy machinery. ? Can cause constipation. You may need to take these actions to prevent or treat constipation:  Drink enough fluid to keep your urine pale yellow.  Take over-the-counter or prescription medicines.  Eat foods that are high in fiber, such as beans, whole grains, and fresh fruits and vegetables.  Limit foods that are high in fat and processed sugars, such as fried or sweet foods. Incision care      Follow instructions from your health care provider about how to take care of your incision. Make sure you: ? Wash your hands with soap and water before and after you change your bandage (dressing). If soap and water are not available, use hand sanitizer. ? Change your dressing as told  by your health care provider. ? Leave stitches (sutures), skin glue, or adhesive strips in place. These skin closures may need to stay in place for 2 weeks or longer. If adhesive strip edges start to loosen and curl up, you may trim the loose edges. Do not remove adhesive strips completely unless your health care provider tells you to do that.  Check your incision area every day for signs of infection. Check for: ? More redness, swelling, or pain. ? Fluid or blood. ? Warmth. ? Pus or a bad smell.  Keep your dressing clean and dry.  If you were sent home with a surgical drain in place, follow instructions from your health care provider about emptying it. Bathing  Do not take baths, swim, or use a hot tub until your health care provider approves.  Ask your health care provider if you may take showers. You may only be allowed to take sponge baths. Activity  Rest as told by your health care provider.  Avoid sitting for a long time without moving. Get up to take short walks every 1-2 hours. This is important to improve blood flow and breathing. Ask for help if you feel weak or unsteady.  Return to your normal activities as told by your health care provider. Ask your health care provider what activities are safe for you.  Be careful to avoid any activities that could cause an injury to your arm on the side of your surgery.  Do not lift anything that is heavier than 10 lb (4.5  kg), or the limit that you are told, until your health care provider says that it is safe. Avoid lifting with the arm that is on the side of your surgery.  Do not carry heavy objects on your shoulder on the side of your surgery.  Do exercises to keep your shoulder and arm from getting stiff and swollen. Talk with your health care provider about which exercises are safe for you. General instructions  Wear a supportive bra as told by your health care provider.  Raise (elevate) your arm above the level of your heart  while you are sitting or lying down.  Do not wear tight jewelry on your arm, wrist, or fingers on the side of your surgery.  Keep all follow-up visits as told by your health care provider. This is important. ? You may need to be screened for extra fluid around the lymph nodes and swelling in the breast and arm (lymphedema). Follow instructions from your health care provider about how often you should be checked.  If you had any lymph nodes removed during your procedure, be sure to tell all of your health care providers. This is important information to share before you are involved in certain procedures, such as having blood tests or having your blood pressure taken. Contact a health care provider if:  You develop a rash.  You have a fever.  Your pain medicine is not working.  You have swelling, weakness, or numbness in your arm that does not improve after a few weeks.  You have new swelling in your breast.  You have any of these signs of infection: ? More redness, swelling, or pain in your incision area. ? Fluid or blood coming from your incision. ? Warmth coming from the incision area. ? Pus or a bad smell coming from your incision. Get help right away if you have:  Very bad pain in your breast or arm.  Swelling in your legs or arms.  Redness, warmth, or pain in your leg or arm.  Chest pain.  Difficulty breathing. Summary  After the procedure, it is common to have breast tenderness, swelling in your breast, and stiffness in your arm and shoulder.  Follow instructions from your health care provider about how to take care of your incision.  Do not lift anything that is heavier than 10 lb (4.5 kg), or the limit that you are told, until your health care provider says that it is safe. Avoid lifting with the arm that is on the side of your surgery.  If you had any lymph nodes removed during your procedure, be sure to tell all of your health care providers. This is important  information to share before you are involved in certain procedures, such as having blood tests or having your blood pressure taken. This information is not intended to replace advice given to you by your health care provider. Make sure you discuss any questions you have with your health care provider. Document Revised: 08/11/2018 Document Reviewed: 08/11/2018 Elsevier Patient Education  [place].

## 2019-05-25 NOTE — Progress Notes (Signed)
05/25/2019  HPI: Taylor [name] is a 86 y.o. female s/p RFID localized right lumpectomy for a right breast mass on 3/23.  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  Patient reports that she's doing well, without any significant pain, except that the breast binder is too tight and is causing friction wound on her medial right breast.  Vital signs: BP 125/76   Pulse 71   Temp (!) 97.2 F (36.2 C) (Temporal)   Ht 5\' 1"  (1.549 m)   Wt 190 lb 6.4 oz (86.4 kg)   SpO2 98%   BMI 35.98 kg/m    Physical Exam: Constitutional:  No acute distress Breast:  Right breast lower outer quadrant incision is healing well, without any evidence of infection, is clean, dry, and intact.  There is a small 1 cm superficial wound in the inner lower quadrant from friction of the binder.  Assessment/Plan: This is a 86 y.o. female s/p RFID localized right lumpectomy  --Discussed pathology results again with patient.  Fibroadenoma, no malignancy. --Given the discomfort from the binder and the friction wound, the binder was removed today and she can wear her regular bra. --Her last mammogram was in 08/2018, with diagnostic right mammogram in 09/2018.  In keeping with her schedule, she will follow up in August with new mammogram.   Melvyn Neth, MD [facility]

## 2019-05-26 ENCOUNTER — Telehealth: Payer: Self-pay | Admitting: *Deleted

## 2019-05-26 NOTE — Telephone Encounter (Signed)
Patient reports that last night when she took her bra off she had some swelling around the side and back of her breast. She says that her breast was swollen over her bra. Patient instructed to use cold/ice pack to the area keeping a cloth between her skin and the ice.  Patient instructed to come in tomorrow to be seen by Dr Hampton Abbot.

## 2019-05-26 NOTE — Telephone Encounter (Signed)
Patient called and stated that she noticed some swelling lower side of right breast near where she had the surgery, she just wants to know what she can do to help with the swelling.  She had surgery on 05/12/19 Dr Hampton Abbot Right Breast Lumpectomy

## 2019-05-27 ENCOUNTER — Ambulatory Visit: Payer: Medicare Other | Admitting: Gastroenterology

## 2019-05-27 ENCOUNTER — Other Ambulatory Visit: Payer: Self-pay

## 2019-05-27 ENCOUNTER — Encounter: Payer: Self-pay | Admitting: Surgery

## 2019-05-27 ENCOUNTER — Ambulatory Visit (INDEPENDENT_AMBULATORY_CARE_PROVIDER_SITE_OTHER): Payer: Medicare Other | Admitting: Gastroenterology

## 2019-05-27 ENCOUNTER — Telehealth: Payer: Self-pay | Admitting: Gastroenterology

## 2019-05-27 ENCOUNTER — Ambulatory Visit (INDEPENDENT_AMBULATORY_CARE_PROVIDER_SITE_OTHER): Payer: Self-pay | Admitting: Surgery

## 2019-05-27 DIAGNOSIS — N6310 Unspecified lump in the right breast, unspecified quadrant: Secondary | ICD-10-CM

## 2019-05-27 DIAGNOSIS — Z09 Encounter for follow-up examination after completed treatment for conditions other than malignant neoplasm: Secondary | ICD-10-CM

## 2019-05-27 DIAGNOSIS — D509 Iron deficiency anemia, unspecified: Secondary | ICD-10-CM | POA: Diagnosis not present

## 2019-05-27 NOTE — Progress Notes (Signed)
05/27/2019  HPI: Taylor [name] is a 86 y.o. female s/p right breast lumpectomy for benign mass on 3/23.  Patient called was seen on 4/5 at which point her breast binder was discontinued as she was having a lot of pain from it and was rubbing against her skin causing a small wound.  However, she called yesterday saying that she felt the right breast was getting more swollen and she presents today for evaluation.  Vital signs: There were no vitals taken for this visit.   Physical Exam: Constitutional:  No acute distress Breast: Right breast incision is clean, dry, intact.  There is some fullness of the breast in the outer lower quadrant near the incision. This is likely a small seroma.    Assessment/Plan: This is a 86 y.o. female s/p right breast lumpectomy.  --Discussed with her that we should place the breast binder back on again, vs getting her a compression sports bra.  She should do this for an additional two weeks and then can remove it again.  She will try to have a neighbor take her to the store to get a sports bra, but will wear the binder if need be.  Unfortunately we do not carry any breast binders in the office to be able to give her a new one that's a larger size. --Follow up prn, and then as scheduled in 4 months for mammogram.   Melvyn Neth, MD [facility]

## 2019-05-27 NOTE — Patient Instructions (Signed)
Dr.Piscoya recommended patient to try a Sports Bra to help with preventing the swelling in the R Breast Binder.   Follow-up with our office as needed.  Please call and ask to speak with a nurse if you develop questions or concerns.

## 2019-05-27 NOTE — Progress Notes (Signed)
Taylor Antigua, MD [address]  [place]  [place], [place] [address]  Main: [phone]  Fax: [phone]   Primary Care Physician: [name], MD  Virtual Visit via Telephone Note  I connected with patient on 05/27/19 at  1:15 PM EDT by telephone and verified that I am speaking with the correct person using two identifiers.   I discussed the limitations, risks, security and privacy concerns of performing an evaluation and management service by telephone and the availability of in person appointments. I also discussed with the patient that there may be a patient responsible charge related to this service. The patient expressed understanding and agreed to proceed.  Location of Patient: Home Location of Provider: Home Persons involved: Patient and provider only during the visit (nursing staff and front desk staff was involved in communicating with the patient prior to the appointment, reviewing medications and checking them in)   History of Present Illness: CC: Anemia   HPI: Taylor [name] is a 86 y.o. female here for follow-up of iron deficiency anemia.  Patient denies any blood in stool.  Was also referred for weight loss as well.  We were awaiting her pathology report from her recent lumpectomy and the results were benign.  Patient has not had an upper endoscopy and colonoscopy was years ago.  Current Outpatient Medications  Medication Sig Dispense Refill  . acetaminophen (TYLENOL) 500 MG tablet Take 1 tablet (500 mg total) by mouth 3 (three) times daily. (Patient taking differently: Take 500 mg by mouth every 6 (six) hours as needed for moderate pain or headache. ) 90 tablet 0  . amLODipine (NORVASC) 10 MG tablet TAKE 1 TABLET BY MOUTH EVERY DAY (Patient taking differently: Take 10 mg by mouth every morning. ) 90 tablet 1  . AZOPT 1 % ophthalmic suspension Place 1 drop into both eyes in the morning and at bedtime.     . bimatoprost (LUMIGAN) 0.01 % SOLN Place 1  drop into both eyes at bedtime.     . brimonidine-timolol (COMBIGAN) 0.2-0.5 % ophthalmic solution Place 1 drop into both eyes 2 (two) times daily.     . Calcium Carbonate-Vitamin D (OYSTER SHELL CALCIUM 500 + D PO) Take 1 tablet by mouth 2 (two) times a day.    . denosumab (PROLIA) 60 MG/ML SOSY injection Inject into the skin.    . famotidine (PEPCID) 20 MG tablet TAKE 1 TABLET BY MOUTH EVERY DAY (Patient taking differently: Take 20 mg by mouth every morning. ) 90 tablet 1  . ferrous sulfate 325 (65 FE) MG tablet TAKE 1 TABLET BY MOUTH EVERY DAY WITH BREAKFAST 90 tablet 1  . fluticasone (FLONASE) 50 MCG/ACT nasal spray SPRAY 2 SPRAYS INTO EACH NOSTRIL EVERY DAY (Patient taking differently: Place 1 spray into both nostrils daily as needed for allergies. ) 16 g 1  . ibuprofen (ADVIL) 600 MG tablet Take 1 tablet (600 mg total) by mouth every 8 (eight) hours as needed for mild pain or moderate pain. 30 tablet 0  . metFORMIN (GLUCOPHAGE-XR) 750 MG 24 hr tablet Take 1 tablet (750 mg total) by mouth daily with breakfast. 90 tablet 0  . olmesartan (BENICAR) 20 MG tablet Take 1 tablet (20 mg total) by mouth daily. (Patient taking differently: Take 20 mg by mouth every morning. ) 90 tablet 0  . omega-3 acid ethyl esters (LOVAZA) 1 g capsule TAKE 1 CAPSULE (1 G TOTAL) BY MOUTH 2 (TWO) TIMES DAILY. 180 capsule 0  .  OneTouch Delica Lancets 16X MISC USE AS DIRECTED 100 each 2  . ONETOUCH ULTRA test strip USE AS DIRECTED 100 strip 12  . oxyCODONE (OXY IR/ROXICODONE) 5 MG immediate release tablet Take 1 tablet (5 mg total) by mouth every 4 (four) hours as needed for severe pain. 30 tablet 0  . [START ON 06/02/2019] oxyCODONE-acetaminophen (PERCOCET) 7.5-325 MG tablet Take 1 tablet by mouth every 8 (eight) hours as needed for moderate pain or severe pain. 90 tablet 0  . [START ON 07/02/2019] oxyCODONE-acetaminophen (PERCOCET) 7.5-325 MG tablet Take 1 tablet by mouth every 8 (eight) hours as needed for moderate pain or  severe pain. 90 tablet 0  . pravastatin (PRAVACHOL) 40 MG tablet Take 1 tablet (40 mg total) by mouth daily. (Patient taking differently: Take 40 mg by mouth at bedtime. ) 90 tablet 1  . Vitamin D, Ergocalciferol, (DRISDOL) 1.25 MG (50000 UNIT) CAPS capsule Take 50,000 Units by mouth every Wednesday.     No current facility-administered medications for this visit.    Allergies as of 05/27/2019 - Review Complete 05/27/2019  Allergen Reaction Noted  . Ace inhibitors Cough 04/03/2017    Review of Systems:    All systems reviewed and negative except where noted in HPI.   Observations/Objective:  Labs: CMP     Component Value Date/Time   NA 140 04/17/2019 1512   NA 141 08/09/2015 0921   K 4.5 04/17/2019 1512   CL 109 04/17/2019 1512   CO2 21 04/17/2019 1512   GLUCOSE 114 (H) 04/17/2019 1512   BUN 22 04/17/2019 1512   BUN 22 08/09/2015 0921   CREATININE 1.21 (H) 04/17/2019 1512   CALCIUM 10.0 04/17/2019 1512   PROT 7.2 04/17/2019 1512   PROT 6.5 08/09/2015 0921   ALBUMIN 4.0 08/14/2016 1012   ALBUMIN 4.1 08/09/2015 0921   AST 11 04/17/2019 1512   ALT 9 04/17/2019 1512   ALKPHOS 56 08/14/2016 1012   BILITOT 0.3 04/17/2019 1512   BILITOT 0.3 08/09/2015 0921   GFRNONAA 41 (L) 04/17/2019 1512   GFRAA 48 (L) 04/17/2019 1512   Lab Results  Component Value Date   WBC 7.3 04/17/2019   HGB 12.1 04/17/2019   HCT 37.1 04/17/2019   MCV 92.1 04/17/2019   PLT 332 04/17/2019    Imaging Studies: MM Breast Surgical Specimen  Result Date: 05/13/2019 CLINICAL DATA:  Status post wire and radiofrequency localized right breast lumpectomy. EXAM: SPECIMEN RADIOGRAPH OF THE RIGHT BREAST COMPARISON:  Previous exam(s). FINDINGS: Status post excision of the right breast. The radiofrequency reflector and wire as well as the heart shaped clip are present within the specimen. IMPRESSION: Specimen radiograph of the right breast. Electronically Signed   By: Lajean Manes M.D.   On: 05/13/2019 13:37     MM DIAG BREAST TOMO UNI RIGHT  Result Date: 05/12/2019 CLINICAL DATA:  Status post Hologic radiofrequency localization as well as standard wire localization of a right breast mass.Post procedure imaging to confirm placement the localization devices. EXAM: DIAGNOSTIC RIGHT MAMMOGRAM POST ULTRASOUND-GUIDED HOLOGIC RADIOFREQUENCY LOCALIZATION DEVICE AND STANDARD KOPAN'S WIRE NEEDLE PLACEMENT. COMPARISON:  Previous exam(s). FINDINGS: Mammographic images were obtained following ultrasound-guided placement of a Hologic radioactive reflector as well as a standard wire localization device. These demonstrate the radiofrequency reflector and the wire lie directly adjacent to the post biopsy marker clip. IMPRESSION: Appropriate location of the Hologic radiofrequency reflector and the localization wire within the right breast. Final Assessment: Post Procedure Mammograms for radiofrequency localizer and wire placement. Electronically Signed  By: Lajean Manes M.D.   On: 05/12/2019 09:35   [place] RT PLC BREAST LOC DEV   1ST LESION  INC US GUIDE  Result Date: 05/12/2019 CLINICAL DATA:  Patient presents for ultrasound-guided localization of a discordant right breast lesion. The lesion will be localized with a radiofrequency capsule as well as a standard wire/needle combination. EXAM: NEEDLE LOCALIZATION OF THE RIGHT BREAST WITH ULTRASOUND GUIDANCE COMPARISON:  Previous exams. FINDINGS: Patient presents for needle localization prior to surgical excision. I met with the patient and we discussed the procedure of needle localization including benefits and alternatives. We discussed the high likelihood of a successful procedure. We discussed the risks of the procedure, including infection, bleeding, tissue injury, and further surgery. Informed, written consent was given. The usual time-out protocol was performed immediately prior to the procedure. Using ultrasound guidance, sterile technique, 1% lidocaine and a 7 cm Hologic  needle, the small mass and associated biopsy clip in the 9 o'clock position of the right breast was localized using a lateral approach. Reflector function was confirmed with an auditory signal. Following this, the lesion was also localized using a 5 cm modified Kopan's wire. The images were marked for Piscoya. Two view postprocedure mammograms were obtained to confirm position of the Hologic radiofrequency reflector and the Kopan's wire. IMPRESSION: Radar reflector localization of the right breast. No apparent complications. Electronically Signed   By: Lajean Manes M.D.   On: 05/12/2019 09:30    Assessment and Plan:   Taylor [name] is a 86 y.o. y/o female here for follow-up of iron deficiency anemia  Assessment and Plan: Proceed with EGD and colonoscopy for iron deficiency anemia and weight loss  I have discussed alternative options, risks & benefits,  which include, but are not limited to, bleeding, infection, perforation,respiratory complication & drug reaction.  The patient agrees with this plan & written consent will be obtained.     Follow Up Instructions:    I discussed the assessment and treatment plan with the patient. The patient was provided an opportunity to ask questions and all were answered. The patient agreed with the plan and demonstrated an understanding of the instructions.   The patient was advised to call back or seek an in-person evaluation if the symptoms worsen or if the condition fails to improve as anticipated.  I provided 12 minutes of non-face-to-face time during this encounter. Additional time was spent in reviewing patient's chart, placing orders etc.   Virgel Manifold, MD  Speech recognition software was used to dictate this note.

## 2019-05-27 NOTE — Telephone Encounter (Signed)
Called to schedule f/u appt w/ Dr. Bonna Gains post procedure. Patient didn't want to schedule the appt until after her procedure.

## 2019-05-28 DIAGNOSIS — M810 Age-related osteoporosis without current pathological fracture: Secondary | ICD-10-CM | POA: Diagnosis not present

## 2019-05-29 ENCOUNTER — Other Ambulatory Visit: Payer: Self-pay

## 2019-05-29 ENCOUNTER — Ambulatory Visit (INDEPENDENT_AMBULATORY_CARE_PROVIDER_SITE_OTHER): Payer: Medicare Other | Admitting: Family Medicine

## 2019-05-29 ENCOUNTER — Encounter: Payer: Self-pay | Admitting: Family Medicine

## 2019-05-29 VITALS — BP 118/58 | HR 68 | Temp 97.5°F | Resp 16 | Ht 61.0 in | Wt 184.1 lb

## 2019-05-29 DIAGNOSIS — E441 Mild protein-calorie malnutrition: Secondary | ICD-10-CM

## 2019-05-29 DIAGNOSIS — D508 Other iron deficiency anemias: Secondary | ICD-10-CM | POA: Diagnosis not present

## 2019-05-29 NOTE — Progress Notes (Addendum)
Name: Taylor [name]   MRN: [id number]    DOB: [birth year]   Date:05/29/2019       Progress Note  Subjective  Chief Complaint  Chief Complaint  Patient presents with  . Obesity    Follow up on weight loss   . Results    Follow up on lab results    HPI  Weight loss: her weight went from 205 lbs to 183 lbs back in Feb 2021. She did not change her diet. Since last visit she was seen by Dr. Hampton Abbot - breast lump was fibroadenoma , she had a virtual visit with Dr. Bonna Gains for evaluation of iron deficiency anemia and is going to have an EGD and colonoscopy. Had a bone density done by Dr. Jefm Bryant that did showed osteoporosis of wrist, but okay on hip, lumbar spine excluded because of history of surgery. FRAX score of 1.7 % 10 years risk of hip fracture and 6.3 % for all major fractures. She has been getting Prolia and will continue medication    Patient Active Problem List   Diagnosis Date Noted  . Morbid obesity ([place]) 04/08/2018  . Age-related osteoporosis without current pathological fracture 09/26/2017  . Pain in both hands 09/23/2017  . Hyperparathyroidism ([place]) 08/06/2017  . Chronic kidney disease, stage III (moderate) 08/06/2017  . HLD (hyperlipidemia) 07/25/2017  . Osteoporotic compression fracture of spine ([place]) 03/22/2017  . Assistance needed with transportation 03/21/2017  . Iron deficiency anemia 12/19/2016  . Aortic atherosclerosis ([place]) 11/23/2016  . Anterolisthesis 11/23/2016  . Chronic pain syndrome 09/13/2016  . Vitamin D deficiency 08/19/2016  . Anemia, unspecified 08/19/2016  . Primary open angle glaucoma (POAG) of both eyes, severe stage 08/17/2016  . Hypertensive retinopathy 08/17/2016  . Long term current use of opiate analgesic 07/19/2016  . Dyslipidemia associated with type 2 diabetes mellitus ([place]) 04/16/2016  . Chronic midline low back pain without sciatica 04/16/2016  . Chronic pain of right knee 04/16/2016  . Vaginal dryness 02/22/2015  . Calculus of  kidney 11/10/2014  . Osteoarthritis, multiple sites 09/09/2014  . Type 2 diabetes mellitus with microalbuminuria ([place]) 09/09/2014  . Glaucoma 09/09/2014  . Benign hypertension with CKD (chronic kidney disease) stage III ([place]) 09/09/2014  . Chronic pain 09/09/2014  . Tinea corporis 09/09/2014  . Umbilical hernia [id number]  . Mass of right breast     Past Surgical History:  Procedure Laterality Date  . ABDOMINAL HYSTERECTOMY  1958   menorrhagia  . BACK SURGERY    . BREAST BIOPSY Left 01/25/2012  . BREAST BIOPSY Right 04/08/2019   hear clip, [place] bx, path pending  . BREAST LUMPECTOMY WITH NEEDLE LOCALIZATION Right 05/12/2019   Procedure: BREAST LUMPECTOMY WITH NEEDLE LOCALIZATION;  Surgeon: Olean Ree, MD;  Location: [facility] ORS;  Service: General;  Laterality: Right;  . CATARACT EXTRACTION, BILATERAL Bilateral    1st 06/07/15 and the 2nd 06/21/15  . CHOLECYSTECTOMY    . COLONOSCOPY  2003   [facility]  . KNEE SURGERY  2009,2011,2012   twice on right and once on left  . KNEE SURGERY    . LIPOMA EXCISION  1998  . [place]  2004  . TONSILLECTOMY     age of 86    Family History  Problem Relation Age of Onset  . Cancer Mother        skin cancer  . Stroke Mother   . Heart disease Father   . Breast cancer Neg Hx     Social History   Tobacco  Use  . Smoking status: Never Smoker  . Smokeless tobacco: Never Used  Substance Use Topics  . Alcohol use: No    Alcohol/week: 0.0 standard drinks     Current Outpatient Medications:  .  acetaminophen (TYLENOL) 500 MG tablet, Take 1 tablet (500 mg total) by mouth 3 (three) times daily. (Patient taking differently: Take 500 mg by mouth every 6 (six) hours as needed for moderate pain or headache. ), Disp: 90 tablet, Rfl: 0 .  amLODipine (NORVASC) 10 MG tablet, TAKE 1 TABLET BY MOUTH EVERY DAY (Patient taking differently: Take 10 mg by mouth every morning. ), Disp: 90 tablet, Rfl: 1 .  AZOPT 1 % ophthalmic suspension, Place 1 drop into both  eyes in the morning and at bedtime. , Disp: , Rfl:  .  bimatoprost (LUMIGAN) 0.01 % SOLN, Place 1 drop into both eyes at bedtime. , Disp: , Rfl:  .  brimonidine-timolol (COMBIGAN) 0.2-0.5 % ophthalmic solution, Place 1 drop into both eyes 2 (two) times daily. , Disp: , Rfl:  .  Calcium Carbonate-Vitamin D (OYSTER SHELL CALCIUM 500 + D PO), Take 1 tablet by mouth 2 (two) times a day., Disp: , Rfl:  .  denosumab (PROLIA) 60 MG/ML SOSY injection, Inject into the skin., Disp: , Rfl:  .  famotidine (PEPCID) 20 MG tablet, TAKE 1 TABLET BY MOUTH EVERY DAY (Patient taking differently: Take 20 mg by mouth every morning. ), Disp: 90 tablet, Rfl: 1 .  ferrous sulfate 325 (65 FE) MG tablet, TAKE 1 TABLET BY MOUTH EVERY DAY WITH BREAKFAST, Disp: 90 tablet, Rfl: 1 .  fluticasone (FLONASE) 50 MCG/ACT nasal spray, SPRAY 2 SPRAYS INTO EACH NOSTRIL EVERY DAY (Patient taking differently: Place 1 spray into both nostrils daily as needed for allergies. ), Disp: 16 g, Rfl: 1 .  ibuprofen (ADVIL) 600 MG tablet, Take 1 tablet (600 mg total) by mouth every 8 (eight) hours as needed for mild pain or moderate pain., Disp: 30 tablet, Rfl: 0 .  metFORMIN (GLUCOPHAGE-XR) 750 MG 24 hr tablet, Take 1 tablet (750 mg total) by mouth daily with breakfast., Disp: 90 tablet, Rfl: 0 .  olmesartan (BENICAR) 20 MG tablet, Take 1 tablet (20 mg total) by mouth daily. (Patient taking differently: Take 20 mg by mouth every morning. ), Disp: 90 tablet, Rfl: 0 .  omega-3 acid ethyl esters (LOVAZA) 1 g capsule, TAKE 1 CAPSULE (1 G TOTAL) BY MOUTH 2 (TWO) TIMES DAILY., Disp: 180 capsule, Rfl: 0 .  OneTouch Delica Lancets [id number] MISC, USE AS DIRECTED, Disp: 100 each, Rfl: 2 .  ONETOUCH ULTRA test strip, USE AS DIRECTED, Disp: 100 strip, Rfl: 12 .  oxyCODONE (OXY IR/ROXICODONE) 5 MG immediate release tablet, Take 1 tablet (5 mg total) by mouth every 4 (four) hours as needed for severe pain., Disp: 30 tablet, Rfl: 0 .  [START ON 06/02/2019]  oxyCODONE-acetaminophen (PERCOCET) 7.5-325 MG tablet, Take 1 tablet by mouth every 8 (eight) hours as needed for moderate pain or severe pain., Disp: 90 tablet, Rfl: 0 .  [START ON 07/02/2019] oxyCODONE-acetaminophen (PERCOCET) 7.5-325 MG tablet, Take 1 tablet by mouth every 8 (eight) hours as needed for moderate pain or severe pain., Disp: 90 tablet, Rfl: 0 .  pravastatin (PRAVACHOL) 40 MG tablet, Take 1 tablet (40 mg total) by mouth daily. (Patient taking differently: Take 40 mg by mouth at bedtime. ), Disp: 90 tablet, Rfl: 1 .  Vitamin D, Ergocalciferol, (DRISDOL) 1.25 MG (50000 UNIT) CAPS capsule, Take 50,000 Units by  mouth every Wednesday., Disp: , Rfl:   Allergies  Allergen Reactions  . Ace Inhibitors Cough    I personally reviewed active problem list, medication list, allergies, family history, social history with the patient/caregiver today.   ROS  Ten systems reviewed and is negative except as mentioned in HPI   Objective  Vitals:   05/29/19 1355  BP: (!) 118/58  Pulse: 68  Resp: 16  Temp: (!) 97.5 F (36.4 C)  TempSrc: Temporal  SpO2: 98%  Weight: 184 lb 1.6 oz (83.5 kg)  Height: 5\' 1"  (1.549 m)    Body mass index is 34.79 kg/m.  Physical Exam  Constitutional: Patient appears well-developed and well-nourished. Obese  No distress.  HEENT: head atraumatic, normocephalic, pupils equal and reactive to light Cardiovascular: Normal rate, regular rhythm and normal heart sounds.  No murmur heard. No BLE edema. Pulmonary/Chest: Effort normal and breath sounds normal. No respiratory distress. Abdominal: Soft.  There is no tenderness. Psychiatric: Patient has a normal mood and affect. behavior is normal. Judgment and thought content normal.  Recent Results (from the past 2160 hour(s))  Surgical pathology     Status: None   Collection Time: 04/08/19  1:35 PM  Result Value Ref Range   SURGICAL PATHOLOGY      SURGICAL PATHOLOGY CASE: [id number] PATIENT: [facility] Surgical Pathology Report     Specimen Submitted: A. Breast, right, 9:00; biopsy  Clinical History: Right breast with cystic and solid mass, 1.6 cm, located at 9 o'clock 6 cm from nipple. Papillary lesion, fibroadenoma r/o malignancy. Post-biopsy mammograms show appropriate positioning of the heart-shaped biopsy marking clip at the site of biopsy in the lateral right breast.     DIAGNOSIS: A. BREAST, RIGHT, 9 O'CLOCK; ULTRASOUND-GUIDED CORE BIOPSY: - FIBROEPITHELIAL LESION, SEE COMMENT.  Comment: This appears to be a biphasic lesion with low stromal cellularity and pericanalicular growth pattern. The epithelial component shows usual ductal hyperplasia, columnar cell change, and apocrine metaplasia, without atypia. There is no cytologic atypia or increased mitotic activity in the stromal component. However, the stromal component seems to extend into the adjacent adipose tissue without a well-defin ed border. Clinical correlation and follow-up imaging to determine size stability are recommended.  GROSS DESCRIPTION: A. Labeled: Ultrasound-guided right breast biopsy at 9 o'clock position and 6 cm from nipple Received: In formalin Time/date in fixative: Tissue procedure time 1:35 PM, tissue put in formalin time 1:35 PM on 04/08/2019 Cold ischemic time: Less than 1 minute Total fixation time: 6 hours 30 minutes Core pieces: 4 Size: Ranging from 0.8-1.2 cm in length and 0.1 cm in diameter Description: Fibrofatty soft tissue cores Ink color: Black Entirely submitted in 1 cassette.   Final Diagnosis performed by Bryan Lemma, MD.   Electronically signed 04/09/2019 4:01:14PM The electronic signature indicates that the named Attending Pathologist has evaluated the specimen Technical component performed at [facility], [address] [place], [place] [address] Lab: [phone] Dir: Rush Farmer, MD, MMM  Professional component performed at [facility], [facility], [place], [place], [place] [address] Lab: [phone] Dir: Dellia Nims. Rubinas, MD   POCT HgB A1C     Status: Normal   Collection Time: 04/17/19  2:08 PM  Result Value Ref Range   Hemoglobin A1C     HbA1c POC (<> result, manual entry)     HbA1c, POC (prediabetic range)     HbA1c, POC (controlled diabetic range) 6.5 0.0 - 7.0 %  TSH     Status: None  Collection Time: 04/17/19  3:12 PM  Result Value Ref Range   TSH 0.63 0.40 - 4.50 mIU/L  COMPLETE METABOLIC PANEL WITH GFR     Status: Abnormal   Collection Time: 04/17/19  3:12 PM  Result Value Ref Range   Glucose, Bld 114 (H) 65 - 99 mg/dL    Comment: .            Fasting reference interval . For someone without known diabetes, a glucose value between 100 and 125 mg/dL is consistent with prediabetes and should be confirmed with a follow-up test. .    BUN 22 7 - 25 mg/dL   Creat 1.21 (H) 0.60 - 0.88 mg/dL    Comment: For patients >19 years of age, the reference limit for Creatinine is approximately 13% higher for people identified as African-American. .    GFR, Est Non African American 41 (L) > OR = 60 mL/min/1.68m2   GFR, Est African American 48 (L) > OR = 60 mL/min/1.63m2   BUN/Creatinine Ratio 18 6 - 22 (calc)   Sodium 140 135 - 146 mmol/L   Potassium 4.5 3.5 - 5.3 mmol/L   Chloride 109 98 - 110 mmol/L   CO2 21 20 - 32 mmol/L   Calcium 10.0 8.6 - 10.4 mg/dL   Total Protein 7.2 6.1 - 8.1 g/dL   Albumin 4.6 3.6 - 5.1 g/dL   Globulin 2.6 1.9 - 3.7 g/dL (calc)   AG Ratio 1.8 1.0 - 2.5 (calc)   Total Bilirubin 0.3 0.2 - 1.2 mg/dL   Alkaline phosphatase (APISO) 34 (L) 37 - 153 U/L   AST 11 10 - 35 U/L   ALT 9 6 - 29 U/L  CBC with Differential/Platelet     Status: None   Collection Time: 04/17/19  3:12 PM  Result Value Ref Range   WBC 7.3 3.8 - 10.8 Thousand/uL   RBC 4.03 3.80 - 5.10 Million/uL   Hemoglobin 12.1 11.7 - 15.5 g/dL   HCT 37.1 35.0 - 45.0 %   MCV 92.1 80.0 - 100.0 fL   MCH 30.0 27.0 - 33.0  pg   MCHC 32.6 32.0 - 36.0 g/dL   RDW 13.3 11.0 - 15.0 %   Platelets 332 140 - 400 Thousand/uL   MPV 10.0 7.5 - 12.5 fL   Neutro Abs 3,913 1,500 - 7,800 cells/uL   Lymphs Abs 2,562 850 - 3,900 cells/uL   Absolute Monocytes 650 200 - 950 cells/uL   Eosinophils Absolute 124 15 - 500 cells/uL   Basophils Absolute 51 0 - 200 cells/uL   Neutrophils Relative % 53.6 %   Total Lymphocyte 35.1 %   Monocytes Relative 8.9 %   Eosinophils Relative 1.7 %   Basophils Relative 0.7 %  Iron, TIBC and Ferritin Panel     Status: None   Collection Time: 04/17/19  3:12 PM  Result Value Ref Range   Iron 83 45 - 160 mcg/dL   TIBC 290 250 - 450 mcg/dL (calc)   %SAT 29 16 - 45 % (calc)   Ferritin 31 16 - 288 ng/mL  Sedimentation rate     Status: None   Collection Time: 04/17/19  3:12 PM  Result Value Ref Range   Sed Rate 14 0 - 30 mm/h  C-reactive protein     Status: None   Collection Time: 04/17/19  3:12 PM  Result Value Ref Range   CRP 0.7 <8.0 mg/L  Microalbumin / creatinine urine ratio     Status:  None   Collection Time: 04/17/19  3:12 PM  Result Value Ref Range   Creatinine, Urine 108 20 - 275 mg/dL   Microalb, Ur 1.8 mg/dL    Comment: Reference Range Not established    Microalb Creat Ratio 17 <30 mcg/mg creat    Comment: . The ADA defines abnormalities in albumin excretion as follows: [name] Category         Result (mcg/mg creatinine) . Normal                    <30 Microalbuminuria         30-299  Clinical albuminuria   > OR = 300 . The ADA recommends that at least two of three specimens collected within a 3-6 month period be abnormal before considering a patient to be within a diagnostic category.   SARS CORONAVIRUS 2 (TAT 6-24 HRS) Nasopharyngeal Nasopharyngeal Swab     Status: None   Collection Time: 05/08/19  9:48 AM   Specimen: Nasopharyngeal Swab  Result Value Ref Range   SARS Coronavirus 2 NEGATIVE NEGATIVE    Comment: (NOTE) SARS-CoV-2 target nucleic acids are NOT  DETECTED. The SARS-CoV-2 RNA is generally detectable in upper and lower respiratory specimens during the acute phase of infection. Negative results do not preclude SARS-CoV-2 infection, do not rule out co-infections with other pathogens, and should not be used as the sole basis for treatment or other patient management decisions. Negative results must be combined with clinical observations, patient history, and epidemiological information. The expected result is Negative. Fact Sheet for Patients: SugarRoll.be Fact Sheet for Healthcare Providers: https://www.woods-mathews.com/ This test is not yet approved or cleared by the [place] FDA and  has been authorized for detection and/or diagnosis of SARS-CoV-2 by FDA under an Emergency Use Authorization (EUA). This EUA will remain  in effect (meaning this test can be used) for the duration of the COVID-19 declaration under Section 56 4(b)(1) of the Act, 21 U.S.C. section 360bbb-3(b)(1), unless the authorization is terminated or revoked sooner. Performed at [facility], [place] [address] [place], [place] [address]   ToxASSURE Select 13 (MW), Urine     Status: None   Collection Time: 05/11/19  9:00 AM  Result Value Ref Range   Summary Note     Comment: ==================================================================== ToxASSURE Select 13 (MW) ==================================================================== Test                             Result       Flag       Units Drug Present and Declared for Prescription Verification   Oxycodone                      778          EXPECTED   ng/mg creat   Oxymorphone                    736          EXPECTED   ng/mg creat   Noroxycodone                   1214         EXPECTED   ng/mg creat   Noroxymorphone                 202          EXPECTED   ng/mg creat  Sources of oxycodone are scheduled prescription medications.    Oxymorphone,  noroxycodone, and noroxymorphone are expected    metabolites of oxycodone. Oxymorphone is also available as a    scheduled prescription medication. ==================================================================== Test                      Result    Flag   Units      Ref Range   Creatinine              108              mg/dL      >=20 ====== ============================================================== Declared Medications:  The flagging and interpretation on this report are based on the  following declared medications.  Unexpected results may arise from  inaccuracies in the declared medications.  **Note: The testing scope of this panel includes these medications:  Oxycodone  **Note: The testing scope of this panel does not include the  following reported medications:  Acetaminophen  Amlodipine  Calcium  Eye Drops  Famotidine  Fluticasone  Iron  Metformin  Olmesartan  Omega-3 Fatty Acids  Pravastatin  Vitamin D  Vitamin D2 (Drisdol) ==================================================================== For clinical consultation, please call [phone]. ====================================================================   Glucose, capillary     Status: Abnormal   Collection Time: 05/12/19  9:50 AM  Result Value Ref Range   Glucose-Capillary 116 (H) 70 - 99 mg/dL    Comment: Glucose reference range applies only to samples taken after fasting for at least 8 hours.  Surgical pathology     Status: None   Collection Time: 05/12/19  4:32 PM  Result Value Ref Range   SURGICAL PATHOLOGY      SURGICAL PATHOLOGY CASE: [id number] PATIENT: [facility] Surgical Pathology Report     Specimen Submitted: A. Breast mass, right  Clinical History: Right breast mass    DIAGNOSIS: A. BREAST, RIGHT; PARTIAL MASTECTOMY: - FIBROADENOMA. - BACKGROUND BENIGN MAMMARY PARENCHYMA WITH FIBROCYSTIC AND APOCRINE CHANGE, STROMAL FIBROSIS, COLUMNAR CELL CHANGE WITHOUT ATYPIA, AND  FOCAL HEMORRHAGE. - CLIP AND BIOPSY SITE IDENTIFIED. - NEGATIVE FOR ATYPICAL PROLIFERATIVE BREAST DISEASE.  GROSS DESCRIPTION: A.  Intraoperative Consultation:     Labeled: Right breast mass     Received: Fresh     Specimen: Breast lumpectomy with needle localization     Pathologic evaluation performed: Gross margin evaluation     Diagnosis: Right breast; excision: Deferred margin assessment. Marker clip present.  No grossly suspicious mass.     Communicated to: Dr. Hampton Abbot at 5:07 PM on 05/12/2019 by Bryan Lemma, M.D.     Tissue submitted: Not applicable  A. Labeled: Right breast mass  Received: Fresh for intraoperative consultation Accompanying specimen radiograph: No Radiographic findings: A digital radiographic image is examined at the time of intraoperative consultation; RF ID tag, biopsy clip, and needle localization wire are present. Time in fixative: Collected at 4:55 PM and placed into formalin at 5:07 PM on 05/12/2019.  Cold ischemic time: Less than 1 hour Total fixation time: 25 hours Type of procedure: Breast lumpectomy with needle localization Location / laterality of specimen: Right Orientation of specimen: The specimen is received with plastic surgical clips designating superior, inferior, anterior, posterior, medial, and lateral.  The specimen is received previously inked. Inking: Anterior = green Inferior = blue Lateral = orange Medial = yellow Posterior = black Superior = red Size of specimen: 5.5 (medial-lateral) x 4.0 (anterior-posterior) x 1.5 cm (superior-inferior) Skin: Absent Biopsy site: Present Number of discrete ma  sses: No distinct mass lesion is grossly identified. Size of irregular fibrous tissue: 1.6 x 1.2 x 1.1 cm Description of irregular fibrous tissue: Sectioning displays a focal, ill-defined area of slightly irregular fibrous tissue with an associated slightly cystic appearance.  Embedded within the irregular fibrous tissue is a heart  shaped biopsy clip as well as an RF ID tag.  No associated, distinct mass lesion is grossly identified. Distance between masses/clips: Not applicable Margins: Superior - 0.2 cm, inferior - 0.5 cm, anterior - 0.9 cm, posterior - 0.4 cm, medial - 1.5 cm, and lateral - 2.2 cm. Description of remainder of tissue: Sectioning the remainder of the specimen displays tan-yellow, lobulated, otherwise grossly unremarkable fibroadipose tissue with a fibrous to adipose ratio of 5:95.  No additional abnormalities or mass lesions are grossly identified.  The specimen is submitted entirely from medial to lateral as follows: 1-2 - perpendicularly, serially s ectioned medial resection margin 3-12 - full-thickness, bisected sections displaying entire area of irregular fibrous tissue in relation to anterior, posterior, superior, and inferior resection margins (submitted from medial to lateral; biopsy clip site in cassette 9; full-thickness sections submitted as follows: 3-4, 5-6, 7-8, 9-10, 11-12) 13-18 - full-thickness, bisected sections displaying grossly unremarkable breast parenchyma lateral to irregular fibrous tissue (submitted from medial to lateral; full-thickness sections submitted as follows: 13-14, 15-16, 17-18) 19-20 - perpendicularly, serially sectioned lateral resection margin    Final Diagnosis performed by Allena Napoleon, MD.   Electronically signed 05/14/2019 1:09:14PM The electronic signature indicates that the named Attending Pathologist has evaluated the specimen Technical component performed at [facility], [address] [place], [place] [address] Lab: [phone] Dir: Rush Farmer, MD, MMM  Professional comp onent performed at [facility], [facility], [place], [place], [place] [address] Lab: [phone] Dir: Dellia Nims. Rubinas, MD   Glucose, capillary     Status: Abnormal   Collection Time: 05/12/19  5:30 PM  Result Value Ref Range   Glucose-Capillary 104 (H)  70 - 99 mg/dL    Comment: Glucose reference range applies only to samples taken after fasting for at least 8 hours.      PHQ2/9: Depression screen [facility] 2/9 05/29/2019 04/28/2019 04/17/2019 12/15/2018 08/13/2018  Decreased Interest 0 0 0 0 0  Down, Depressed, Hopeless 0 0 0 0 0  PHQ - 2 Score 0 0 0 0 0  Altered sleeping 0 - 0 0 0  Tired, decreased energy 0 - 0 0 1  Change in appetite 0 - 0 0 1  Feeling bad or failure about yourself  0 - 0 0 0  Trouble concentrating 0 - 0 0 0  Moving slowly or fidgety/restless 0 - 0 0 0  Suicidal thoughts 0 - 0 0 0  PHQ-9 Score 0 - 0 0 2  Difficult doing work/chores - - Not difficult at all - Not difficult at all  Some recent data might be hidden    phq 9 is negative   Fall Risk: Fall Risk  05/29/2019 05/27/2019 05/25/2019 04/28/2019 04/17/2019  Falls in the past year? 0 0 0 0 0  Comment - - - - -  Number falls in past yr: 0 0 0 0 0  Injury with Fall? 0 0 0 0 0  Risk Factor Category  - - - - -  Risk for fall due to : - - - Impaired balance/gait;Impaired mobility;Orthopedic patient -  Follow up - - - Falls prevention discussed -     Functional Status Survey: Is  the patient deaf or have difficulty hearing?: Yes Does the patient have difficulty seeing, even when wearing glasses/contacts?: Yes Does the patient have difficulty concentrating, remembering, or making decisions?: No Does the patient have difficulty walking or climbing stairs?: Yes Does the patient have difficulty dressing or bathing?: Yes Does the patient have difficulty doing errands alone such as visiting a doctor's office or shopping?: Yes   Assessment & Plan  1. Other iron deficiency anemia  Seeing GI    2. Mild protein-calorie malnutrition ([place])   weight has improved, discussed small meals, adding protein snacks. Reviewed labs done during her labs visit

## 2019-06-01 ENCOUNTER — Ambulatory Visit: Payer: Medicare Other | Attending: Family Medicine | Admitting: Physical Therapy

## 2019-06-01 ENCOUNTER — Other Ambulatory Visit: Payer: Self-pay | Admitting: Family Medicine

## 2019-06-01 ENCOUNTER — Telehealth: Payer: Self-pay

## 2019-06-01 ENCOUNTER — Encounter: Payer: Self-pay | Admitting: Physical Therapy

## 2019-06-01 ENCOUNTER — Other Ambulatory Visit: Payer: Self-pay

## 2019-06-01 DIAGNOSIS — M8949 Other hypertrophic osteoarthropathy, multiple sites: Secondary | ICD-10-CM

## 2019-06-01 DIAGNOSIS — M545 Low back pain: Secondary | ICD-10-CM | POA: Diagnosis not present

## 2019-06-01 DIAGNOSIS — R296 Repeated falls: Secondary | ICD-10-CM | POA: Diagnosis not present

## 2019-06-01 DIAGNOSIS — R531 Weakness: Secondary | ICD-10-CM

## 2019-06-01 DIAGNOSIS — G8929 Other chronic pain: Secondary | ICD-10-CM | POA: Diagnosis not present

## 2019-06-01 DIAGNOSIS — R262 Difficulty in walking, not elsewhere classified: Secondary | ICD-10-CM | POA: Insufficient documentation

## 2019-06-01 DIAGNOSIS — R269 Unspecified abnormalities of gait and mobility: Secondary | ICD-10-CM

## 2019-06-01 DIAGNOSIS — M159 Polyosteoarthritis, unspecified: Secondary | ICD-10-CM

## 2019-06-01 DIAGNOSIS — Z9181 History of falling: Secondary | ICD-10-CM

## 2019-06-01 NOTE — Therapy (Signed)
[facility] [address], [place], [address] Phone: [phone]   Fax:  [phone]  Physical Therapy Treatment/Re-Evaluation  Patient Details  Name: Taylor [name] MRN: [id number] Date of Birth: [birth year] No data recorded  Encounter Date: 06/01/2019  PT End of Session - 06/01/19 1717    Visit Number  22    Number of Visits  25    Date for PT Re-Evaluation  08/24/19    Authorization - Visit Number  1    Authorization - Number of Visits  10    PT Start Time  0410    PT Stop Time  0505    PT Time Calculation (min)  55 min    Equipment Utilized During Treatment  Gait belt    Activity Tolerance  Patient tolerated treatment well    Behavior During Therapy  WFL for tasks assessed/performed       Past Medical History:  Diagnosis Date  . Abnormal mammogram, unspecified 2013  . Anemia   . Arthritis   . Breast screening, unspecified 2013  . Diabetes mellitus without complication ([place]) [id number]   non insulin dependent  . Dysrhythmia    IRREGULAR HEART BEAT  . GERD (gastroesophageal reflux disease)   . Glaucoma 2003  . Gout   . History of kidney stones    H/O  . Hyperlipidemia 2008  . Hypertension 1980's  . Lump or mass in breast 01/03/2012   left breast  . Obesity, unspecified 2013  . Osteoporosis   . Shingles 2013  . Special screening for malignant neoplasms, colon 2013    Past Surgical History:  Procedure Laterality Date  . ABDOMINAL HYSTERECTOMY  1958   menorrhagia  . BACK SURGERY    . BREAST BIOPSY Left 01/25/2012  . BREAST BIOPSY Right 04/08/2019   hear clip, [place] bx, path pending  . BREAST LUMPECTOMY WITH NEEDLE LOCALIZATION Right 05/12/2019   Procedure: BREAST LUMPECTOMY WITH NEEDLE LOCALIZATION;  Surgeon: Olean Ree, MD;  Location: [facility] OR[facility];  Service: General;  Laterality: Right;  . CATARACT EXTRACTION, BILATERAL Bilateral    1st 06/07/15 and the 2nd 06/21/15  . CHOLECYSTECTOMY    .  COLONOSCOPY  2003   [facility]  . KNEE SURGERY  2009,2011,2012   twice on right and once on left  . KNEE SURGERY    . LIPOMA EXCISION  1998  . August  2004  . TONSILLECTOMY     age of 42    There were no vitals filed for this visit.  Subjective Assessment - 06/01/19 1619    Subjective  Patient returns to PT following lumpectomy 05/12/19. Reports she is not having any pain, just deconditioned following not moving around as much, and some stiffness in the chest from 1in scar. Reports no falls, though she has had some near falls. Reports non-compliance with HEP.    Pertinent History  Patinet is a 86 year old female presenting to clinic for LBP. Patient has LBP that bothers her when she stands up for 10-54mins for ADLS. Back pain does not radiate and feels achy. She can only sit without back support for 76mins without pain. Worst pain in the past week 9/10 best: 5/10. Reports she has had about 3 falls in the past year, and is usually falling backwards. Reports she is very unsteady with stepping onto varied surfaces. Ambulates with a hurry cane in her LUE and uses her RUE to furiture walk, and has a RW that  she will use when she has someone to put it in her car with her when she goes out. When she gets to a store, she uses cart to help her walk. She lives alone and completes her own cooking, cleaning, but has trouble standing for the time it takes to cook/do dishing. Patient reports she gives herself bird baths in her bathroom because she cannot step over her tub to get into her tub/shower that has a seat in it. She drives and completes her own errands. When her church was open she enjoyed attending, enjoys playing games on her Ipad, and has friends in the community that come and check on her daily, as well as being apart of the "Are You Okay" program with the [place] police (they call daily and if you do not answer they will send the police there to check on you). No steps to enter 1 story home, has a ramp.  Denies sensation deficits.  Pt denies N/V, B&B changes, unexplained weight fluctuation, saddle paresthesia, fever, night sweats, or unrelenting night pain at this time.    Limitations  Sitting;Lifting;Standing;Walking    How long can you sit comfortably?  without support 10-90mins    How long can you stand comfortably?  10-29mins    How long can you walk comfortably?  with cane 76mins    Patient Stated Goals  Walk better    Pain Onset  More than a month ago       Ther-Ex Nustep LE only L2 seat setting 5 44mins; cuing for SPM above 45, fair consistency 5xSTS with definite use of UE, unable to establish balance standing without support, only able to complete  BERG assessment with guarding and physical assistance needed for all tests Review of the following therex with patient able to complete a set of each with cuing for technique with good correction. Education on rep/set , intensity, frequency, and purpose of therex; need for consistency to rebuild strength, with verbalized understanding  Access Code: [email or web address]: Seated March - 2 x daily - 7 x weekly - 10 reps  Seated Long Arc Quad - 2 x daily - 7 x weekly - 10 reps  Sit to Stand - 2 x daily - 7 x weekly - 5 reps  Standing Hip Abduction with Counter Support - 2 x daily - 7 x weekly - 5 reps   Gait Training Ambulation around clinic 131ft with 10MWT assessed supervision > CGA needed for safety, cuing for foot clearance, about 24mins to complete. Education on normalized gait pattern following with demonstration of patient's current gait vs. Normalized/more stabilized gait                     PT Education - 06/01/19 1717    Education Details  reassessment, HEP review, goal update, progress review    Person(s) Educated  Patient    Methods  Explanation;Demonstration;Verbal cues;Tactile cues    Comprehension  Verbalized understanding;Returned demonstration;Verbal cues required;Tactile cues required       PT Short Term Goals  - 06/01/19 1651      PT SHORT TERM GOAL #1   Title  Pt will be independent with HEP in order to improve strength and decrease back pain in order to improve pain-free function at home and increase QOL    Baseline  06/01/19 HEP reprinted d/t non compliance following surgery 04/23/19 75% compliance subjectively reported from patient 03/03/19 Completing HEP with 50% compliance    Time  4    Period  Weeks    Status  Revised      PT SHORT TERM GOAL #2   Title  Pt will improve BERG by at least 3 points in order to demonstrate clinically significant improvement in balance.    Baseline  06/01/19 28/56    Time  5    Period  Weeks    Status  Revised      PT SHORT TERM GOAL #3   Title  Pt will increase 10MWT by at least 0.13 m/s in order to demonstrate clinically significant improvement in community ambulation.    Baseline  06/01/19 0.67m/s    Time  5    Period  Weeks    Status  Revised      PT SHORT TERM GOAL #4   Title  Pt will be able to complete 5TSTS  in order to demonstrate clinically significant improvement in LE strength    Baseline  06/01/19 unable    Time  6    Period  Weeks    Status  Revised        PT Long Term Goals - 06/01/19 1633      PT LONG TERM GOAL #1   Title  Patient will obtain a score of 46/56 on the BERG to demonstrate decreased fall risk    Baseline  06/01/19 28/56 04/23/19 33/56 03/02/54 31/56    Time  12    Period  Weeks    Status  Revised      PT LONG TERM GOAL #2   Title  Patient will demonstrate walk speed of 0.8-1.17m/s on 10MWT in order to demonstrate household ambulator walk speed with safety    Baseline  06/01/19 0.73m/s with supervision and [facility] 04/23/19 0.38m/s with CGA and [facility] 12/30/18 0.28m/s with HHA with SPC, needing min-modA to  Prevent LOB on 4 occasions    Time  12    Period  Weeks    Status  Revised      PT LONG TERM GOAL #3   Title  Patient will complete 5xSTS without UE support in 14.8sec in order to demonstrate age matched strength norms     Baseline  06/01/19 unable to complete 04/23/19 20sec 03/03/19 36sec    Time  12    Period  Weeks    Status  Revised      PT LONG TERM GOAL #4   Title  Patient will be able to ambulate 283ft with SPC without LOB/need for external support, and without instances of LOB, in order to demonstrate safety walking household distances/into story    Baseline  06/01/19 154ft with SPC, RUE high gaurd, supervision with instances of CGA needed to maintain balance 04/23/19 167ft with SPC and CGA with high gaurd with RUE, 1 instance of incresaed time needed to re-establish balance.    Time  12    Period  Weeks    Status  Revised            Plan - 06/01/19 1720    Clinical Impression Statement  PT re-evaluated patient this session following absence from PT from surgery (lumpectomy). Patient as significantly regressed from previous PT progress. all goals were revised to re-establis baseline. Patient is understanding of reasoning behind regression and verbalises understanidng of importance of HEP and PT to aid in increasing safe independence. Patient requires icreased time and gaurding with all goal assessment, therex, and ambulation. PT will continue progression as able.    Personal Factors and Comorbidities  Age;Fitness;Comorbidity 1;Comorbidity 2;Comorbidity 3+;Past/Current  Experience;Sex    Comorbidities  HTN, HLD, GERD, osteoporosis    Examination-Activity Limitations  Bathing;Sit;Dressing;Transfers;Bed Mobility;Bend;Lift;Carry;Reach Overhead;Stand;Stairs    Examination-Participation Restrictions  Church;Laundry;Cleaning;Community Activity;Meal Prep    Stability/Clinical Decision Making  Evolving/Moderate complexity    Clinical Decision Making  Moderate    Rehab Potential  Good    PT Frequency  2x / week    PT Duration  8 weeks    PT Treatment/Interventions  Moist Heat;Traction;Gait training;Stair training;Balance training;Dry needling;Joint Manipulations;Spinal Manipulations;Passive range of motion;Manual  techniques;Patient/family education;Therapeutic exercise;Electrical Stimulation;ADLs/Self Care Home Management;Therapeutic activities;Functional mobility training;Energy conservation;Neuromuscular re-education    PT Next Visit Plan  Balance training, LE/core strengthening    PT Home Exercise Plan  STS from chair, seated hip abd, seated marching    Consulted and Agree with Plan of Care  Patient       Patient will benefit from skilled therapeutic intervention in order to improve the following deficits and impairments:  Abnormal gait, Decreased balance, Decreased endurance, Decreased mobility, Difficulty walking, Hypomobility, Cardiopulmonary status limiting activity, Decreased range of motion, Improper body mechanics, Decreased activity tolerance, Decreased coordination, Decreased safety awareness, Decreased strength, Increased fascial restricitons, Impaired flexibility, Postural dysfunction, Pain  Visit Diagnosis: Difficulty in walking, not elsewhere classified  Chronic midline low back pain without sciatica  Repeated falls     Problem List Patient Active Problem List   Diagnosis Date Noted  . Morbid obesity ([place]) 04/08/2018  . Age-related osteoporosis without current pathological fracture 09/26/2017  . Pain in both hands 09/23/2017  . Hyperparathyroidism ([place]) 08/06/2017  . Chronic kidney disease, stage III (moderate) 08/06/2017  . HLD (hyperlipidemia) 07/25/2017  . Osteoporotic compression fracture of spine ([place]) 03/22/2017  . Assistance needed with transportation 03/21/2017  . Iron deficiency anemia 12/19/2016  . Aortic atherosclerosis ([place]) 11/23/2016  . Anterolisthesis 11/23/2016  . Chronic pain syndrome 09/13/2016  . Vitamin D deficiency 08/19/2016  . Anemia, unspecified 08/19/2016  . Primary open angle glaucoma (POAG) of both eyes, severe stage 08/17/2016  . Hypertensive retinopathy 08/17/2016  . Long term current use of opiate analgesic 07/19/2016  . Dyslipidemia  associated with type 2 diabetes mellitus ([place]) 04/16/2016  . Chronic midline low back pain without sciatica 04/16/2016  . Chronic pain of right knee 04/16/2016  . Vaginal dryness 02/22/2015  . Calculus of kidney 11/10/2014  . Osteoarthritis, multiple sites 09/09/2014  . Type 2 diabetes mellitus with microalbuminuria ([place]) 09/09/2014  . Glaucoma 09/09/2014  . Benign hypertension with CKD (chronic kidney disease) stage III ([place]) 09/09/2014  . Chronic pain 09/09/2014  . Tinea corporis 09/09/2014  . Umbilical hernia [id number]  . Mass of right breast    Shelton Silvas PT, DPT Shelton Silvas 06/01/2019, 5:23 PM  [facility] [address] [place], [address] Phone: [phone]   Fax:  [phone]  Name: Taylor [name] MRN: [id number] Date of Birth: 1932-12-17

## 2019-06-02 ENCOUNTER — Telehealth: Payer: Self-pay | Admitting: Gastroenterology

## 2019-06-02 ENCOUNTER — Telehealth: Payer: Self-pay

## 2019-06-02 NOTE — Telephone Encounter (Signed)
Patient called to schedule visit. Referral closed. Unable to schedule.Please reopen

## 2019-06-02 NOTE — Telephone Encounter (Signed)
Patient is now scheduled for 08/11/19.

## 2019-06-02 NOTE — Telephone Encounter (Signed)
Patient is not a new patient. Patient was seen on 05/27/2019. You can make patient a appointment with no referral. She is a Dr. Frederica Kuster patient

## 2019-06-02 NOTE — Telephone Encounter (Signed)
activities as tolerated 

## 2019-06-02 NOTE — Telephone Encounter (Signed)
Patient called & l/m on v/m to have Caryl Pina call her regarding a letter she received.

## 2019-06-02 NOTE — Telephone Encounter (Signed)
Called and left a message for call back  

## 2019-06-03 ENCOUNTER — Encounter: Payer: Self-pay | Admitting: Physical Therapy

## 2019-06-03 ENCOUNTER — Ambulatory Visit: Payer: Medicare Other | Admitting: Physical Therapy

## 2019-06-03 ENCOUNTER — Other Ambulatory Visit: Payer: Self-pay

## 2019-06-03 DIAGNOSIS — R262 Difficulty in walking, not elsewhere classified: Secondary | ICD-10-CM | POA: Diagnosis not present

## 2019-06-03 DIAGNOSIS — G8929 Other chronic pain: Secondary | ICD-10-CM | POA: Diagnosis not present

## 2019-06-03 DIAGNOSIS — R296 Repeated falls: Secondary | ICD-10-CM | POA: Diagnosis not present

## 2019-06-03 DIAGNOSIS — M545 Low back pain: Secondary | ICD-10-CM | POA: Diagnosis not present

## 2019-06-03 NOTE — Therapy (Signed)
[facility] [address], [place], [address] Phone: [phone]   Fax:  [phone]  Physical Therapy Treatment  Patient Details  Name: Taylor [name] MRN: [id number] Date of Birth: 1932-12-12 No data recorded  Encounter Date: 06/03/2019  PT End of Session - 06/03/19 1324    Visit Number  23    Number of Visits  63    Date for PT Re-Evaluation  08/24/19    Authorization - Visit Number  2    Authorization - Number of Visits  10    PT Start Time  0111    PT Stop Time  0150    PT Time Calculation (min)  39 min    Activity Tolerance  Patient tolerated treatment well    Behavior During Therapy  [facility] for tasks assessed/performed       Past Medical History:  Diagnosis Date  . Abnormal mammogram, unspecified 2013  . Anemia   . Arthritis   . Breast screening, unspecified 2013  . Diabetes mellitus without complication ([place]) [id number]   non insulin dependent  . Dysrhythmia    IRREGULAR HEART BEAT  . GERD (gastroesophageal reflux disease)   . Glaucoma 2003  . Gout   . History of kidney stones    H/O  . Hyperlipidemia 2008  . Hypertension 1980's  . Lump or mass in breast 01/03/2012   left breast  . Obesity, unspecified 2013  . Osteoporosis   . Shingles 2013  . Special screening for malignant neoplasms, colon 2013    Past Surgical History:  Procedure Laterality Date  . ABDOMINAL HYSTERECTOMY  1958   menorrhagia  . BACK SURGERY    . BREAST BIOPSY Left 01/25/2012  . BREAST BIOPSY Right 04/08/2019   hear clip, [place] bx, path pending  . BREAST LUMPECTOMY WITH NEEDLE LOCALIZATION Right 05/12/2019   Procedure: BREAST LUMPECTOMY WITH NEEDLE LOCALIZATION;  Surgeon: Olean Ree, MD;  Location: [facility] ORS;  Service: General;  Laterality: Right;  . CATARACT EXTRACTION, BILATERAL Bilateral    1st 06/07/15 and the 2nd 06/21/15  . CHOLECYSTECTOMY    . COLONOSCOPY  2003   [facility]  . KNEE SURGERY  2009,2011,2012   twice on  right and once on left  . KNEE SURGERY    . LIPOMA EXCISION  1998  . [place]  2004  . TONSILLECTOMY     age of 29    There were no vitals filed for this visit.  Subjective Assessment - 06/03/19 1315    Subjective  Patient reports she was really tired following last session. No pain today. Has not started her HPE yet, but has plans to complete over the weekend    Pertinent History  Patinet is a 86 year old female presenting to clinic for LBP. Patient has LBP that bothers her when she stands up for 10-32mins for ADLS. Back pain does not radiate and feels achy. She can only sit without back support for 35mins without pain. Worst pain in the past week 9/10 best: 5/10. Reports she has had about 3 falls in the past year, and is usually falling backwards. Reports she is very unsteady with stepping onto varied surfaces. Ambulates with a hurry cane in her LUE and uses her RUE to furiture walk, and has a RW that she will use when she has someone to put it in her car with her when she goes out. When she gets to a store, she uses cart to  help her walk. She lives alone and completes her own cooking, cleaning, but has trouble standing for the time it takes to cook/do dishing. Patient reports she gives herself bird baths in her bathroom because she cannot step over her tub to get into her tub/shower that has a seat in it. She drives and completes her own errands. When her church was open she enjoyed attending, enjoys playing games on her Ipad, and has friends in the community that come and check on her daily, as well as being apart of the "Are You Okay" program with the [place] police (they call daily and if you do not answer they will send the police there to check on you). No steps to enter 1 story home, has a ramp. Denies sensation deficits.  Pt denies N/V, B&B changes, unexplained weight fluctuation, saddle paresthesia, fever, night sweats, or unrelenting night pain at this time.    Limitations   Sitting;Lifting;Standing;Walking    How long can you sit comfortably?  without support 10-35mins    How long can you stand comfortably?  10-26mins    How long can you walk comfortably?  with cane 51mins    Patient Stated Goals  Walk better    Pain Onset  More than a month ago           THEREX -Nustep L2 keeping spm above 45 for 4 mins with patient able to maintain SPM with encouragement - STS from elevated (nustep seat)with unilateral push off 3x 8 with cuing for "quick" stand and controlled lower with good carry over following - Standing alt  foot taps onto 3in step 3x 10  bilat UE support; supervision for safety, cuing to not "drag" foot off of step, but to lift with good carry over - Alt seated marching 2x 10 with cuing for posture with good carry over  Gait Training  Ambulation around clinic 3ft ; CGA for safety minA occasionally with turns, with decrease Inside through clinic and outside down decline >5% grade to parking lot with curb step down. PT mostly supervision, but had to provide min assist when pt has 2-3 missteps; pt able to regain balance within 3 seconds on own                      PT Education - 06/03/19 1316    Education Details  therex form/technique    Person(s) Educated  Patient    Methods  Explanation;Demonstration;Verbal cues;Tactile cues    Comprehension  Verbalized understanding;Returned demonstration;Verbal cues required;Tactile cues required       PT Short Term Goals - 06/01/19 1651      PT SHORT TERM GOAL #1   Title  Pt will be independent with HEP in order to improve strength and decrease back pain in order to improve pain-free function at home and increase QOL    Baseline  06/01/19 HEP reprinted d/t non compliance following surgery 04/23/19 75% compliance subjectively reported from patient 03/03/19 Completing HEP with 50% compliance    Time  4    Period  Weeks    Status  Revised      PT SHORT TERM GOAL #2   Title  Pt will improve  BERG by at least 3 points in order to demonstrate clinically significant improvement in balance.    Baseline  06/01/19 28/56    Time  5    Period  Weeks    Status  Revised      PT SHORT TERM GOAL #  3   Title  Pt will increase 10MWT by at least 0.13 m/s in order to demonstrate clinically significant improvement in community ambulation.    Baseline  06/01/19 0.36m/s    Time  5    Period  Weeks    Status  Revised      PT SHORT TERM GOAL #4   Title  Pt will be able to complete 5TSTS  in order to demonstrate clinically significant improvement in LE strength    Baseline  06/01/19 unable    Time  6    Period  Weeks    Status  Revised        PT Long Term Goals - 06/01/19 1633      PT LONG TERM GOAL #1   Title  Patient will obtain a score of 46/56 on the BERG to demonstrate decreased fall risk    Baseline  06/01/19 28/56 04/23/19 33/56 03/02/54 31/56    Time  12    Period  Weeks    Status  Revised      PT LONG TERM GOAL #2   Title  Patient will demonstrate walk speed of 0.8-1.28m/s on 10MWT in order to demonstrate household ambulator walk speed with safety    Baseline  06/01/19 0.71m/s with supervision and [facility] 04/23/19 0.59m/s with CGA and [facility] 12/30/18 0.46m/s with HHA with SPC, needing min-modA to  Prevent LOB on 4 occasions    Time  12    Period  Weeks    Status  Revised      PT LONG TERM GOAL #3   Title  Patient will complete 5xSTS without UE support in 14.8sec in order to demonstrate age matched strength norms    Baseline  06/01/19 unable to complete 04/23/19 20sec 03/03/19 36sec    Time  12    Period  Weeks    Status  Revised      PT LONG TERM GOAL #4   Title  Patient will be able to ambulate 220ft with SPC without LOB/need for external support, and without instances of LOB, in order to demonstrate safety walking household distances/into story    Baseline  06/01/19 150ft with SPC, RUE high gaurd, supervision with instances of CGA needed to maintain balance 04/23/19 144ft with SPC and CGA  with high gaurd with RUE, 1 instance of incresaed time needed to re-establish balance.    Time  12    Period  Weeks    Status  Revised            Plan - 06/03/19 1412    Clinical Impression Statement  PT initiated therex within tolerable and pain free range for LE strength and power to increase independence with transfers, and gait training for increased balance and endurance with gait. Patient is able to complete all therex with increased rest breaks needed, CGA and occassional minA for safety, and good compliance of cuing for proper technique of therex. PT educated patient on nutrition and adequate hydration needed with increased activity to prevent fatigue and for increasing strength, with examples of protein foods; patient verbalizes understanding. PT will continue progression as able.    Personal Factors and Comorbidities  Age;Fitness;Comorbidity 1;Comorbidity 2;Comorbidity 3+;Past/Current Experience;Sex    Comorbidities  HTN, HLD, GERD, osteoporosis    Examination-Activity Limitations  Bathing;Sit;Dressing;Transfers;Bed Mobility;Bend;Lift;Carry;Reach Overhead;Stand;Stairs    Examination-Participation Restrictions  Church;Laundry;Cleaning;Community Activity;Meal Prep    Stability/Clinical Decision Making  Evolving/Moderate complexity    Clinical Decision Making  Moderate    Rehab Potential  Good    PT Frequency  2x / week    PT Duration  8 weeks    PT Treatment/Interventions  Moist Heat;Traction;Gait training;Stair training;Balance training;Dry needling;Joint Manipulations;Spinal Manipulations;Passive range of motion;Manual techniques;Patient/family education;Therapeutic exercise;Electrical Stimulation;ADLs/Self Care Home Management;Therapeutic activities;Functional mobility training;Energy conservation;Neuromuscular re-education    PT Next Visit Plan  Balance training, LE/core strengthening    PT Home Exercise Plan  STS from chair, seated hip abd, seated marching    Consulted and  Agree with Plan of Care  Patient       Patient will benefit from skilled therapeutic intervention in order to improve the following deficits and impairments:  Abnormal gait, Decreased balance, Decreased endurance, Decreased mobility, Difficulty walking, Hypomobility, Cardiopulmonary status limiting activity, Decreased range of motion, Improper body mechanics, Decreased activity tolerance, Decreased coordination, Decreased safety awareness, Decreased strength, Increased fascial restricitons, Impaired flexibility, Postural dysfunction, Pain  Visit Diagnosis: Difficulty in walking, not elsewhere classified  Chronic midline low back pain without sciatica  Repeated falls     Problem List Patient Active Problem List   Diagnosis Date Noted  . Morbid obesity ([place]) 04/08/2018  . Age-related osteoporosis without current pathological fracture 09/26/2017  . Pain in both hands 09/23/2017  . Hyperparathyroidism ([place]) 08/06/2017  . Chronic kidney disease, stage III (moderate) 08/06/2017  . HLD (hyperlipidemia) 07/25/2017  . Osteoporotic compression fracture of spine ([place]) 03/22/2017  . Assistance needed with transportation 03/21/2017  . Iron deficiency anemia 12/19/2016  . Aortic atherosclerosis ([place]) 11/23/2016  . Anterolisthesis 11/23/2016  . Chronic pain syndrome 09/13/2016  . Vitamin D deficiency 08/19/2016  . Anemia, unspecified 08/19/2016  . Primary open angle glaucoma (POAG) of both eyes, severe stage 08/17/2016  . Hypertensive retinopathy 08/17/2016  . Long term current use of opiate analgesic 07/19/2016  . Dyslipidemia associated with type 2 diabetes mellitus ([place]) 04/16/2016  . Chronic midline low back pain without sciatica 04/16/2016  . Chronic pain of right knee 04/16/2016  . Vaginal dryness 02/22/2015  . Calculus of kidney 11/10/2014  . Osteoarthritis, multiple sites 09/09/2014  . Type 2 diabetes mellitus with microalbuminuria ([place]) 09/09/2014  . Glaucoma 09/09/2014  .  Benign hypertension with CKD (chronic kidney disease) stage III ([place]) 09/09/2014  . Chronic pain 09/09/2014  . Tinea corporis 09/09/2014  . Umbilical hernia [id number]  . Mass of right breast    Shelton Silvas PT, DPT Shelton Silvas 06/03/2019, 2:18 PM  [facility] [address] [place], [address] Phone: [phone]   Fax:  [phone]  Name: Taylor [name] MRN: [id number] Date of Birth: [birth year]

## 2019-06-05 ENCOUNTER — Ambulatory Visit: Payer: Medicare Other

## 2019-06-08 ENCOUNTER — Telehealth: Payer: Self-pay

## 2019-06-08 ENCOUNTER — Other Ambulatory Visit: Payer: Self-pay

## 2019-06-08 DIAGNOSIS — D509 Iron deficiency anemia, unspecified: Secondary | ICD-10-CM

## 2019-06-08 DIAGNOSIS — R634 Abnormal weight loss: Secondary | ICD-10-CM

## 2019-06-08 MED ORDER — NA SULFATE-K SULFATE-MG SULF 17.5-3.13-1.6 GM/177ML PO SOLN: 354.0000 mL | Freq: Once | ORAL | 0 refills | Status: AC

## 2019-06-08 NOTE — Telephone Encounter (Signed)
-----   Message from [place], [place] sent at 05/27/2019  2:13 PM EDT ----- Pt had a visit with [facility] on 05/27/19. EGD/Colon for IDA and weight loss. When I called pt back, she needed to find a driver first. I have mailed her the instructions etc. You may want to follow up to see if she has called back to schedule.

## 2019-06-08 NOTE — Telephone Encounter (Addendum)
Patient states she has found a ride. Scheduled patient for 07/02/2019

## 2019-06-09 ENCOUNTER — Encounter: Payer: Self-pay | Admitting: Physical Therapy

## 2019-06-09 ENCOUNTER — Other Ambulatory Visit: Payer: Self-pay

## 2019-06-09 ENCOUNTER — Ambulatory Visit: Payer: Medicare Other | Admitting: Physical Therapy

## 2019-06-09 DIAGNOSIS — G8929 Other chronic pain: Secondary | ICD-10-CM

## 2019-06-09 DIAGNOSIS — R296 Repeated falls: Secondary | ICD-10-CM | POA: Diagnosis not present

## 2019-06-09 DIAGNOSIS — R262 Difficulty in walking, not elsewhere classified: Secondary | ICD-10-CM | POA: Diagnosis not present

## 2019-06-09 DIAGNOSIS — M545 Low back pain, unspecified: Secondary | ICD-10-CM

## 2019-06-09 NOTE — Therapy (Signed)
[place] [facility] [address], [place], [address] Phone: [phone]   Fax:  [phone]  Physical Therapy Treatment  Patient Details  Name: Taylor [name] MRN: [id number] Date of Birth: Jun 30, 1932 No data recorded  Encounter Date: 06/09/2019  PT End of Session - 06/09/19 1445    Visit Number  24    Number of Visits  41    Date for PT Re-Evaluation  08/24/19    Authorization - Visit Number  3    Authorization - Number of Visits  10    PT Start Time  0240    PT Stop Time  0318    PT Time Calculation (min)  38 min    Equipment Utilized During Treatment  Gait belt    Activity Tolerance  Patient tolerated treatment well    Behavior During Therapy  [facility] for tasks assessed/performed       Past Medical History:  Diagnosis Date  . Abnormal mammogram, unspecified 2013  . Anemia   . Arthritis   . Breast screening, unspecified 2013  . Diabetes mellitus without complication ([place]) [id number]   non insulin dependent  . Dysrhythmia    IRREGULAR HEART BEAT  . GERD (gastroesophageal reflux disease)   . Glaucoma 2003  . Gout   . History of kidney stones    H/O  . Hyperlipidemia 2008  . Hypertension 1980's  . Lump or mass in breast 01/03/2012   left breast  . Obesity, unspecified 2013  . Osteoporosis   . Shingles 2013  . Special screening for malignant neoplasms, colon 2013    Past Surgical History:  Procedure Laterality Date  . ABDOMINAL HYSTERECTOMY  1958   menorrhagia  . BACK SURGERY    . BREAST BIOPSY Left 01/25/2012  . BREAST BIOPSY Right 04/08/2019   hear clip, [place] bx, path pending  . BREAST LUMPECTOMY WITH NEEDLE LOCALIZATION Right 05/12/2019   Procedure: BREAST LUMPECTOMY WITH NEEDLE LOCALIZATION;  Surgeon: Olean Ree, MD;  Location: [facility] ORS;  Service: General;  Laterality: Right;  . CATARACT EXTRACTION, BILATERAL Bilateral    1st 06/07/15 and the 2nd 06/21/15  . CHOLECYSTECTOMY    . COLONOSCOPY  2003    [facility]  . KNEE SURGERY  2009,2011,2012   twice on right and once on left  . KNEE SURGERY    . LIPOMA EXCISION  1998  . [place]  2004  . TONSILLECTOMY     age of 42    There were no vitals filed for this visit.  Subjective Assessment - 06/09/19 1442    Subjective  Reports she had a good weekend and attempted to complete her HEP over the weekend, but not yesterday. Has no pain today.    Pertinent History  Patinet is a 86 year old female presenting to clinic for LBP. Patient has LBP that bothers her when she stands up for 10-52mins for ADLS. Back pain does not radiate and feels achy. She can only sit without back support for 59mins without pain. Worst pain in the past week 9/10 best: 5/10. Reports she has had about 3 falls in the past year, and is usually falling backwards. Reports she is very unsteady with stepping onto varied surfaces. Ambulates with a hurry cane in her LUE and uses her RUE to furiture walk, and has a RW that she will use when she has someone to put it in her car with her when she goes out. When she gets to  a store, she uses cart to help her walk. She lives alone and completes her own cooking, cleaning, but has trouble standing for the time it takes to cook/do dishing. Patient reports she gives herself bird baths in her bathroom because she cannot step over her tub to get into her tub/shower that has a seat in it. She drives and completes her own errands. When her church was open she enjoyed attending, enjoys playing games on her Ipad, and has friends in the community that come and check on her daily, as well as being apart of the "Are You Okay" program with the [place] police (they call daily and if you do not answer they will send the police there to check on you). No steps to enter 1 story home, has a ramp. Denies sensation deficits.  Pt denies N/V, B&B changes, unexplained weight fluctuation, saddle paresthesia, fever, night sweats, or unrelenting night pain at this time.     Limitations  Sitting;Lifting;Standing;Walking    How long can you sit comfortably?  without support 10-85mins    How long can you stand comfortably?  10-69mins    How long can you walk comfortably?  with cane 20mins    Patient Stated Goals  Walk better    Pain Onset  More than a month ago          THEREX -Nustep L2 keeping spm above 48for 5 mins with patient able to maintain SPM with encouragement; seat setting 6 - STS from elevated (nustep seat)with unilateral push off x8; with no push off 2x 8 (cane needed to stabilize balance following) with cuing for "quick" stand and controlled lower with good carry over following   Gait Training Ambulation over 156ft with random called out speed changes CGA with occasional minA needed, mostly with decreasing speed and negotiating around turns Ambulation over 153ft with 2 hurdle negotiation requiring modA for first and minA with heavy cuing and encouragement for second  Inside through clinic and outside down decline >5% grade to parking lot with curb step down. PT mostly supervision, but had to provide min assist when pt has 2-3 missteps; pt able to regain balance within 3 seconds on own                      PT Education - 06/09/19 1444    Education Details  therex form/technique    Person(s) Educated  Patient    Methods  Explanation;Demonstration;Verbal cues    Comprehension  Verbalized understanding;Returned demonstration;Verbal cues required       PT Short Term Goals - 06/01/19 1651      PT SHORT TERM GOAL #1   Title  Pt will be independent with HEP in order to improve strength and decrease back pain in order to improve pain-free function at home and increase QOL    Baseline  06/01/19 HEP reprinted d/t non compliance following surgery 04/23/19 75% compliance subjectively reported from patient 03/03/19 Completing HEP with 50% compliance    Time  4    Period  Weeks    Status  Revised      PT SHORT TERM GOAL #2   Title   Pt will improve BERG by at least 3 points in order to demonstrate clinically significant improvement in balance.    Baseline  06/01/19 28/56    Time  5    Period  Weeks    Status  Revised      PT SHORT TERM GOAL #3  Title  Pt will increase 10MWT by at least 0.13 m/s in order to demonstrate clinically significant improvement in community ambulation.    Baseline  06/01/19 0.85m/s    Time  5    Period  Weeks    Status  Revised      PT SHORT TERM GOAL #4   Title  Pt will be able to complete 5TSTS  in order to demonstrate clinically significant improvement in LE strength    Baseline  06/01/19 unable    Time  6    Period  Weeks    Status  Revised        PT Long Term Goals - 06/01/19 1633      PT LONG TERM GOAL #1   Title  Patient will obtain a score of 46/56 on the BERG to demonstrate decreased fall risk    Baseline  06/01/19 28/56 04/23/19 33/56 03/02/54 31/56    Time  12    Period  Weeks    Status  Revised      PT LONG TERM GOAL #2   Title  Patient will demonstrate walk speed of 0.8-1.43m/s on 10MWT in order to demonstrate household ambulator walk speed with safety    Baseline  06/01/19 0.79m/s with supervision and [facility] 04/23/19 0.13m/s with CGA and [facility] 12/30/18 0.64m/s with HHA with SPC, needing min-modA to  Prevent LOB on 4 occasions    Time  12    Period  Weeks    Status  Revised      PT LONG TERM GOAL #3   Title  Patient will complete 5xSTS without UE support in 14.8sec in order to demonstrate age matched strength norms    Baseline  06/01/19 unable to complete 04/23/19 20sec 03/03/19 36sec    Time  12    Period  Weeks    Status  Revised      PT LONG TERM GOAL #4   Title  Patient will be able to ambulate 213ft with SPC without LOB/need for external support, and without instances of LOB, in order to demonstrate safety walking household distances/into story    Baseline  06/01/19 19ft with SPC, RUE high gaurd, supervision with instances of CGA needed to maintain balance 04/23/19 138ft with  SPC and CGA with high gaurd with RUE, 1 instance of incresaed time needed to re-establish balance.    Time  12    Period  Weeks    Status  Revised            Plan - 06/09/19 1509    Clinical Impression Statement  PT continued therex and gait progression for increased balance and endurance demand with good success. Patient is able to comply with all cuing for proper technique, and is motivated throughout session. Patient requires gaurding and occassional assistance for safety, but is able to tolerate progression. PT will continue progression as able.    Personal Factors and Comorbidities  Age;Fitness;Comorbidity 1;Comorbidity 2;Comorbidity 3+;Past/Current Experience;Sex    Comorbidities  HTN, HLD, GERD, osteoporosis    Examination-Activity Limitations  Bathing;Sit;Dressing;Transfers;Bed Mobility;Bend;Lift;Carry;Reach Overhead;Stand;Stairs    Examination-Participation Restrictions  Church;Laundry;Cleaning;Community Activity;Meal Prep    Stability/Clinical Decision Making  Evolving/Moderate complexity    Clinical Decision Making  Moderate    Rehab Potential  Good    PT Frequency  2x / week    PT Duration  8 weeks    PT Treatment/Interventions  Moist Heat;Traction;Gait training;Stair training;Balance training;Dry needling;Joint Manipulations;Spinal Manipulations;Passive range of motion;Manual techniques;Patient/family education;Therapeutic exercise;Electrical Stimulation;ADLs/Self Care Home Management;Therapeutic  activities;Functional mobility training;Energy conservation;Neuromuscular re-education    PT Next Visit Plan  Balance training, LE/core strengthening    PT Home Exercise Plan  STS from chair, seated hip abd, seated marching    Consulted and Agree with Plan of Care  Patient       Patient will benefit from skilled therapeutic intervention in order to improve the following deficits and impairments:  Abnormal gait, Decreased balance, Decreased endurance, Decreased mobility, Difficulty  walking, Hypomobility, Cardiopulmonary status limiting activity, Decreased range of motion, Improper body mechanics, Decreased activity tolerance, Decreased coordination, Decreased safety awareness, Decreased strength, Increased fascial restricitons, Impaired flexibility, Postural dysfunction, Pain  Visit Diagnosis: Difficulty in walking, not elsewhere classified  Chronic midline low back pain without sciatica  Repeated falls     Problem List Patient Active Problem List   Diagnosis Date Noted  . Morbid obesity ([place]) 04/08/2018  . Age-related osteoporosis without current pathological fracture 09/26/2017  . Pain in both hands 09/23/2017  . Hyperparathyroidism ([place]) 08/06/2017  . Chronic kidney disease, stage III (moderate) 08/06/2017  . HLD (hyperlipidemia) 07/25/2017  . Osteoporotic compression fracture of spine ([place]) 03/22/2017  . Assistance needed with transportation 03/21/2017  . Iron deficiency anemia 12/19/2016  . Aortic atherosclerosis ([place]) 11/23/2016  . Anterolisthesis 11/23/2016  . Chronic pain syndrome 09/13/2016  . Vitamin D deficiency 08/19/2016  . Anemia, unspecified 08/19/2016  . Primary open angle glaucoma (POAG) of both eyes, severe stage 08/17/2016  . Hypertensive retinopathy 08/17/2016  . Long term current use of opiate analgesic 07/19/2016  . Dyslipidemia associated with type 2 diabetes mellitus ([place]) 04/16/2016  . Chronic midline low back pain without sciatica 04/16/2016  . Chronic pain of right knee 04/16/2016  . Vaginal dryness 02/22/2015  . Calculus of kidney 11/10/2014  . Osteoarthritis, multiple sites 09/09/2014  . Type 2 diabetes mellitus with microalbuminuria ([place]) 09/09/2014  . Glaucoma 09/09/2014  . Benign hypertension with CKD (chronic kidney disease) stage III ([place]) 09/09/2014  . Chronic pain 09/09/2014  . Tinea corporis 09/09/2014  . Umbilical hernia [id number]  . Mass of right breast    Shelton Silvas PT, DPT Shelton Silvas 06/09/2019,  3:11 PM  [place] [facility] [address], [place], [address] Phone: [phone]   Fax:  [phone]  Name: TAWNNI BESTER MRN: [id number] Date of Birth: [birth year]

## 2019-06-11 ENCOUNTER — Other Ambulatory Visit: Payer: Self-pay

## 2019-06-11 ENCOUNTER — Encounter: Payer: Self-pay | Admitting: Physical Therapy

## 2019-06-11 ENCOUNTER — Ambulatory Visit: Payer: Medicare Other | Admitting: Physical Therapy

## 2019-06-11 DIAGNOSIS — R262 Difficulty in walking, not elsewhere classified: Secondary | ICD-10-CM

## 2019-06-11 DIAGNOSIS — G8929 Other chronic pain: Secondary | ICD-10-CM | POA: Diagnosis not present

## 2019-06-11 DIAGNOSIS — M545 Low back pain, unspecified: Secondary | ICD-10-CM

## 2019-06-11 DIAGNOSIS — R296 Repeated falls: Secondary | ICD-10-CM

## 2019-06-11 NOTE — Therapy (Signed)
[place] [facility] [name] [place], [address] Phone: [phone]   Fax:  [phone]  Physical Therapy Treatment  Patient Details  Name: Taylor [name] MRN: [id number] Date of Birth: 17-Jun-1932 No data recorded  Encounter Date: 06/11/2019  PT End of Session - 06/11/19 1541    Visit Number  25    Number of Visits  74    Date for PT Re-Evaluation  08/24/19    Authorization - Visit Number  4    Authorization - Number of Visits  10    PT Start Time  0315    PT Stop Time  0409    PT Time Calculation (min)  54 min    Equipment Utilized During Treatment  Gait belt    Activity Tolerance  Patient tolerated treatment well    Behavior During Therapy  [facility] for tasks assessed/performed       Past Medical History:  Diagnosis Date  . Abnormal mammogram, unspecified 2013  . Anemia   . Arthritis   . Breast screening, unspecified 2013  . Diabetes mellitus without complication ([place]) [id number]   non insulin dependent  . Dysrhythmia    IRREGULAR HEART BEAT  . GERD (gastroesophageal reflux disease)   . Glaucoma 2003  . Gout   . History of kidney stones    H/O  . Hyperlipidemia 2008  . Hypertension 1980's  . Lump or mass in breast 01/03/2012   left breast  . Obesity, unspecified 2013  . Osteoporosis   . Shingles 2013  . Special screening for malignant neoplasms, colon 2013    Past Surgical History:  Procedure Laterality Date  . ABDOMINAL HYSTERECTOMY  1958   menorrhagia  . BACK SURGERY    . BREAST BIOPSY Left 01/25/2012  . BREAST BIOPSY Right 04/08/2019   hear clip, [place] bx, path pending  . BREAST LUMPECTOMY WITH NEEDLE LOCALIZATION Right 05/12/2019   Procedure: BREAST LUMPECTOMY WITH NEEDLE LOCALIZATION;  Surgeon: Olean Ree, MD;  Location: [facility] ORS;  Service: General;  Laterality: Right;  . CATARACT EXTRACTION, BILATERAL Bilateral    1st 06/07/15 and the 2nd 06/21/15  . CHOLECYSTECTOMY    . COLONOSCOPY  2003    [facility]  . KNEE SURGERY  2009,2011,2012   twice on right and once on left  . KNEE SURGERY    . LIPOMA EXCISION  1998  . [place]  2004  . TONSILLECTOMY     age of 36    There were no vitals filed for this visit.  Subjective Assessment - 06/11/19 1537    Subjective  Reports she felt good following last session. Has been trying to complete some of her HEP as able.    Pertinent History  Patinet is a 86 year old female presenting to clinic for LBP. Patient has LBP that bothers her when she stands up for 10-65mins for ADLS. Back pain does not radiate and feels achy. She can only sit without back support for 59mins without pain. Worst pain in the past week 9/10 best: 5/10. Reports she has had about 3 falls in the past year, and is usually falling backwards. Reports she is very unsteady with stepping onto varied surfaces. Ambulates with a hurry cane in her LUE and uses her RUE to furiture walk, and has a RW that she will use when she has someone to put it in her car with her when she goes out. When she gets to a store, she uses  cart to help her walk. She lives alone and completes her own cooking, cleaning, but has trouble standing for the time it takes to cook/do dishing. Patient reports she gives herself bird baths in her bathroom because she cannot step over her tub to get into her tub/shower that has a seat in it. She drives and completes her own errands. When her church was open she enjoyed attending, enjoys playing games on her Ipad, and has friends in the community that come and check on her daily, as well as being apart of the "Are You Okay" program with the [place] police (they call daily and if you do not answer they will send the police there to check on you). No steps to enter 1 story home, has a ramp. Denies sensation deficits.  Pt denies N/V, B&B changes, unexplained weight fluctuation, saddle paresthesia, fever, night sweats, or unrelenting night pain at this time.    Limitations   Sitting;Lifting;Standing;Walking    How long can you sit comfortably?  without support 10-66mins    How long can you stand comfortably?  10-93mins    How long can you walk comfortably?  with cane 39mins    Patient Stated Goals  Walk better    Pain Onset  More than a month ago       THEREX -Nustep L2 keeping spm above60 for 37mins with patient able to maintain SPM with encouragement; seat setting 6 no UE  - STS from elevated (nustep seat) with no push off 3x 10/8/8 with patient able to establish balance in standing with increased time, without SPC - Standing abd BUE support 3x 10 each LEwith standing rests between, cuing for posture with decent carry over - Sidestepping with BUE supported 3x 10-45ft (5-68ft R, 5-50ft L), cuing for foot clearance with good carry over; standing rests between sets -Standing rotations with BUE flexed holding yellow tball 3x 12 rotations CGA - supervision needed for safety; increased time needed but good ability to re-establish balance  Gait Training Ambulation over 167ft with random called out speed changes CGA with occasional stopping needed to re-establish balance, mostly with decreasing speed and negotiating around turns Inside through clinic and outside down decline >5% grade to parking lot with curb step down. PT mostly supervision, but had to provide min assist when pt has 2-3 missteps; pt able to regain balance within 3 seconds on own                           PT Education - 06/11/19 1538    Education Details  therex form/technique    Person(s) Educated  Patient    Methods  Explanation;Demonstration;Verbal cues    Comprehension  Verbalized understanding;Returned demonstration;Verbal cues required       PT Short Term Goals - 06/01/19 1651      PT SHORT TERM GOAL #1   Title  Pt will be independent with HEP in order to improve strength and decrease back pain in order to improve pain-free function at home and increase QOL     Baseline  06/01/19 HEP reprinted d/t non compliance following surgery 04/23/19 75% compliance subjectively reported from patient 03/03/19 Completing HEP with 50% compliance    Time  4    Period  Weeks    Status  Revised      PT SHORT TERM GOAL #2   Title  Pt will improve BERG by at least 3 points in order to demonstrate clinically significant improvement  in balance.    Baseline  06/01/19 28/56    Time  5    Period  Weeks    Status  Revised      PT SHORT TERM GOAL #3   Title  Pt will increase 10MWT by at least 0.13 m/s in order to demonstrate clinically significant improvement in community ambulation.    Baseline  06/01/19 0.42m/s    Time  5    Period  Weeks    Status  Revised      PT SHORT TERM GOAL #4   Title  Pt will be able to complete 5TSTS  in order to demonstrate clinically significant improvement in LE strength    Baseline  06/01/19 unable    Time  6    Period  Weeks    Status  Revised        PT Long Term Goals - 06/01/19 1633      PT LONG TERM GOAL #1   Title  Patient will obtain a score of 46/56 on the BERG to demonstrate decreased fall risk    Baseline  06/01/19 28/56 04/23/19 33/56 03/02/54 31/56    Time  12    Period  Weeks    Status  Revised      PT LONG TERM GOAL #2   Title  Patient will demonstrate walk speed of 0.8-1.70m/s on 10MWT in order to demonstrate household ambulator walk speed with safety    Baseline  06/01/19 0.47m/s with supervision and [facility] 04/23/19 0.48m/s with CGA and [facility] 12/30/18 0.53m/s with HHA with SPC, needing min-modA to  Prevent LOB on 4 occasions    Time  12    Period  Weeks    Status  Revised      PT LONG TERM GOAL #3   Title  Patient will complete 5xSTS without UE support in 14.8sec in order to demonstrate age matched strength norms    Baseline  06/01/19 unable to complete 04/23/19 20sec 03/03/19 36sec    Time  12    Period  Weeks    Status  Revised      PT LONG TERM GOAL #4   Title  Patient will be able to ambulate 266ft with SPC without  LOB/need for external support, and without instances of LOB, in order to demonstrate safety walking household distances/into story    Baseline  06/01/19 18ft with SPC, RUE high gaurd, supervision with instances of CGA needed to maintain balance 04/23/19 140ft with SPC and CGA with high gaurd with RUE, 1 instance of incresaed time needed to re-establish balance.    Time  12    Period  Weeks    Status  Revised            Plan - 06/11/19 1548    Clinical Impression Statement  PT continued therex progression for increased BLE strength and balance with good success. PT continued to increase standing demand which patient tolerated well. Patient is increasing ability to re-establish balance without external support, though it takes increased time to re-establish balance. PT will continue progression as able.    Personal Factors and Comorbidities  Age;Fitness;Comorbidity 1;Comorbidity 2;Comorbidity 3+;Past/Current Experience;Sex    Comorbidities  HTN, HLD, GERD, osteoporosis    Examination-Activity Limitations  Bathing;Sit;Dressing;Transfers;Bed Mobility;Bend;Lift;Carry;Reach Overhead;Stand;Stairs    Examination-Participation Restrictions  Church;Laundry;Cleaning;Community Activity;Meal Prep    Stability/Clinical Decision Making  Evolving/Moderate complexity    Clinical Decision Making  Moderate    Rehab Potential  Good    PT Frequency  2x / week    PT Duration  8 weeks    PT Treatment/Interventions  Moist Heat;Traction;Gait training;Stair training;Balance training;Dry needling;Joint Manipulations;Spinal Manipulations;Passive range of motion;Manual techniques;Patient/family education;Therapeutic exercise;Electrical Stimulation;ADLs/Self Care Home Management;Therapeutic activities;Functional mobility training;Energy conservation;Neuromuscular re-education    PT Next Visit Plan  Balance training, LE/core strengthening    PT Home Exercise Plan  STS from chair, seated hip abd, seated marching     Consulted and Agree with Plan of Care  Patient       Patient will benefit from skilled therapeutic intervention in order to improve the following deficits and impairments:  Abnormal gait, Decreased balance, Decreased endurance, Decreased mobility, Difficulty walking, Hypomobility, Cardiopulmonary status limiting activity, Decreased range of motion, Improper body mechanics, Decreased activity tolerance, Decreased coordination, Decreased safety awareness, Decreased strength, Increased fascial restricitons, Impaired flexibility, Postural dysfunction, Pain  Visit Diagnosis: Difficulty in walking, not elsewhere classified  Chronic midline low back pain without sciatica  Repeated falls     Problem List Patient Active Problem List   Diagnosis Date Noted  . Morbid obesity ([place]) 04/08/2018  . Age-related osteoporosis without current pathological fracture 09/26/2017  . Pain in both hands 09/23/2017  . Hyperparathyroidism ([place]) 08/06/2017  . Chronic kidney disease, stage III (moderate) 08/06/2017  . HLD (hyperlipidemia) 07/25/2017  . Osteoporotic compression fracture of spine ([place]) 03/22/2017  . Assistance needed with transportation 03/21/2017  . Iron deficiency anemia 12/19/2016  . Aortic atherosclerosis ([place]) 11/23/2016  . Anterolisthesis 11/23/2016  . Chronic pain syndrome 09/13/2016  . Vitamin D deficiency 08/19/2016  . Anemia, unspecified 08/19/2016  . Primary open angle glaucoma (POAG) of both eyes, severe stage 08/17/2016  . Hypertensive retinopathy 08/17/2016  . Long term current use of opiate analgesic 07/19/2016  . Dyslipidemia associated with type 2 diabetes mellitus ([place]) 04/16/2016  . Chronic midline low back pain without sciatica 04/16/2016  . Chronic pain of right knee 04/16/2016  . Vaginal dryness 02/22/2015  . Calculus of kidney 11/10/2014  . Osteoarthritis, multiple sites 09/09/2014  . Type 2 diabetes mellitus with microalbuminuria ([place]) 09/09/2014  . Glaucoma  09/09/2014  . Benign hypertension with CKD (chronic kidney disease) stage III ([place]) 09/09/2014  . Chronic pain 09/09/2014  . Tinea corporis 09/09/2014  . Umbilical hernia [id number]  . Mass of right breast    Shelton Silvas PT, DPT Shelton Silvas 06/11/2019, 4:39 PM  [place] [facility] [address], [place], [address] Phone: [phone]   Fax:  [phone]  Name: GLENDOLYN MELIA MRN: [id number] Date of Birth: 25-Nov-1932

## 2019-06-15 ENCOUNTER — Ambulatory Visit: Payer: Medicare Other | Admitting: Physical Therapy

## 2019-06-16 ENCOUNTER — Telehealth: Payer: Self-pay | Admitting: Family Medicine

## 2019-06-16 NOTE — Chronic Care Management (AMB) (Signed)
  Chronic Care Management   Outreach Note  06/16/2019 Name: Taylor [name] MRN: [id number] DOB: Nov 24, 1932  Taylor [name] is a 86 y.o. year old female who is a primary care patient of [name], MD. I reached out to Taylor [name] by phone today in response to a referral sent by Ms. Holli Humbles Bateson's health plan.     An unsuccessful telephone outreach was attempted today. The patient was referred to the case management team for assistance with care management and care coordination.   Follow Up Plan: A HIPPA compliant phone message was left for the patient providing contact information and requesting a return call.  The care management team will reach out to the patient again over the next 7 days.  If patient returns call to provider office, please advise to call [facility]  at [place], [place], [place], [place] [address] Direct Dial: [phone] [email or web address]

## 2019-06-17 ENCOUNTER — Other Ambulatory Visit: Payer: Self-pay

## 2019-06-17 ENCOUNTER — Ambulatory Visit: Payer: Medicare Other | Admitting: Physical Therapy

## 2019-06-17 ENCOUNTER — Encounter: Payer: Self-pay | Admitting: Physical Therapy

## 2019-06-17 DIAGNOSIS — R296 Repeated falls: Secondary | ICD-10-CM | POA: Diagnosis not present

## 2019-06-17 DIAGNOSIS — M810 Age-related osteoporosis without current pathological fracture: Secondary | ICD-10-CM | POA: Diagnosis not present

## 2019-06-17 DIAGNOSIS — G8929 Other chronic pain: Secondary | ICD-10-CM | POA: Diagnosis not present

## 2019-06-17 DIAGNOSIS — M545 Low back pain, unspecified: Secondary | ICD-10-CM

## 2019-06-17 DIAGNOSIS — R262 Difficulty in walking, not elsewhere classified: Secondary | ICD-10-CM | POA: Diagnosis not present

## 2019-06-17 NOTE — Therapy (Signed)
[facility] [address] [place], [address] Phone: [phone]   Fax:  [phone]  Physical Therapy Treatment  Patient Details  Name: Taylor [name] MRN: [id number] Date of Birth: 15-May-1932 No data recorded  Encounter Date: 06/17/2019    Past Medical History:  Diagnosis Date  . Abnormal mammogram, unspecified 2013  . Anemia   . Arthritis   . Breast screening, unspecified 2013  . Diabetes mellitus without complication ([place]) [id number]   non insulin dependent  . Dysrhythmia    IRREGULAR HEART BEAT  . GERD (gastroesophageal reflux disease)   . Glaucoma 2003  . Gout   . History of kidney stones    H/O  . Hyperlipidemia 2008  . Hypertension 1980's  . Lump or mass in breast 01/03/2012   left breast  . Obesity, unspecified 2013  . Osteoporosis   . Shingles 2013  . Special screening for malignant neoplasms, colon 2013    Past Surgical History:  Procedure Laterality Date  . ABDOMINAL HYSTERECTOMY  1958   menorrhagia  . BACK SURGERY    . BREAST BIOPSY Left 01/25/2012  . BREAST BIOPSY Right 04/08/2019   hear clip, [place] bx, path pending  . BREAST LUMPECTOMY WITH NEEDLE LOCALIZATION Right 05/12/2019   Procedure: BREAST LUMPECTOMY WITH NEEDLE LOCALIZATION;  Surgeon: Olean Ree, MD;  Location: [facility] ORS;  Service: General;  Laterality: Right;  . CATARACT EXTRACTION, BILATERAL Bilateral    1st 06/07/15 and the 2nd 06/21/15  . CHOLECYSTECTOMY    . COLONOSCOPY  2003   [facility]  . KNEE SURGERY  2009,2011,2012   twice on right and once on left  . KNEE SURGERY    . LIPOMA EXCISION  1998  . [place]  2004  . TONSILLECTOMY     age of 31    There were no vitals filed for this visit.  Subjective Assessment - 06/17/19 1523    Subjective  Patient reports she feels good today. Her daughter has been staying her, which she reports has been nice. Has not had time to complete her HEP    Pertinent History  Patinet is  a 86 year old female presenting to clinic for LBP. Patient has LBP that bothers her when she stands up for 10-50mins for ADLS. Back pain does not radiate and feels achy. She can only sit without back support for 12mins without pain. Worst pain in the past week 9/10 best: 5/10. Reports she has had about 3 falls in the past year, and is usually falling backwards. Reports she is very unsteady with stepping onto varied surfaces. Ambulates with a hurry cane in her LUE and uses her RUE to furiture walk, and has a RW that she will use when she has someone to put it in her car with her when she goes out. When she gets to a store, she uses cart to help her walk. She lives alone and completes her own cooking, cleaning, but has trouble standing for the time it takes to cook/do dishing. Patient reports she gives herself bird baths in her bathroom because she cannot step over her tub to get into her tub/shower that has a seat in it. She drives and completes her own errands. When her church was open she enjoyed attending, enjoys playing games on her Ipad, and has friends in the community that come and check on her daily, as well as being apart of the "Are You Okay" program with the [place] police (  they call daily and if you do not answer they will send the police there to check on you). No steps to enter 1 story home, has a ramp. Denies sensation deficits.  Pt denies N/V, B&B changes, unexplained weight fluctuation, saddle paresthesia, fever, night sweats, or unrelenting night pain at this time.    Limitations  Sitting;Lifting;Standing;Walking    How long can you sit comfortably?  without support 10-78mins    How long can you stand comfortably?  10-68mins    How long can you walk comfortably?  with cane 57mins    Patient Stated Goals  Walk better    Pain Onset  More than a month ago         THEREX -Nustep L2 keeping spm above60 for27min; L3 63mins with spm at 50 or above with patient able to maintain SPM with  encouragement; seat setting 6 no UE  - STS from elevated (nustep seat) with no push off 3x 67/8 with patient able to establish balance in standing with increased time, without SPC; minA needed throughout first set to establish standing balance, much better following - Standing abd BUE support 3x 10 each LEwith standing rests between, cuing for posture with decent carry over - Sidestepping with BUE supported 3x 10-30ft (5-39ft R, 5-42ft L), cuing for foot clearance with good carry over; standing rests between sets -Standing rotations with BUE flexed holding yellow tball 3x 12 rotations CGA - supervision needed for safety; increased time needed but good ability to re-establish balance  Gait Training Ambulation over 157ft with 3 hurdle negotiation; increased time needed for hurdle negotiation and min-modA needed for safety 7ft with cone negotiation with zig zag pattern x4 Inside through clinic and outside down decline >5% grade to parking lot with curb step down. PT mostly supervision, but had to provide min assist when pt has 2-3 missteps; pt able to regain balance within 3 seconds on own                         PT Short Term Goals - 06/01/19 1651      PT SHORT TERM GOAL #1   Title  Pt will be independent with HEP in order to improve strength and decrease back pain in order to improve pain-free function at home and increase QOL    Baseline  06/01/19 HEP reprinted d/t non compliance following surgery 04/23/19 75% compliance subjectively reported from patient 03/03/19 Completing HEP with 50% compliance    Time  4    Period  Weeks    Status  Revised      PT SHORT TERM GOAL #2   Title  Pt will improve BERG by at least 3 points in order to demonstrate clinically significant improvement in balance.    Baseline  06/01/19 28/56    Time  5    Period  Weeks    Status  Revised      PT SHORT TERM GOAL #3   Title  Pt will increase 10MWT by at least 0.13 m/s in order to demonstrate  clinically significant improvement in community ambulation.    Baseline  06/01/19 0.26m/s    Time  5    Period  Weeks    Status  Revised      PT SHORT TERM GOAL #4   Title  Pt will be able to complete 5TSTS  in order to demonstrate clinically significant improvement in LE strength    Baseline  06/01/19 unable  Time  6    Period  Weeks    Status  Revised        PT Long Term Goals - 06/01/19 1633      PT LONG TERM GOAL #1   Title  Patient will obtain a score of 46/56 on the BERG to demonstrate decreased fall risk    Baseline  06/01/19 28/56 04/23/19 33/56 03/02/54 31/56    Time  12    Period  Weeks    Status  Revised      PT LONG TERM GOAL #2   Title  Patient will demonstrate walk speed of 0.8-1.50m/s on 10MWT in order to demonstrate household ambulator walk speed with safety    Baseline  06/01/19 0.13m/s with supervision and [facility] 04/23/19 0.62m/s with CGA and [facility] 12/30/18 0.27m/s with HHA with SPC, needing min-modA to  Prevent LOB on 4 occasions    Time  12    Period  Weeks    Status  Revised      PT LONG TERM GOAL #3   Title  Patient will complete 5xSTS without UE support in 14.8sec in order to demonstrate age matched strength norms    Baseline  06/01/19 unable to complete 04/23/19 20sec 03/03/19 36sec    Time  12    Period  Weeks    Status  Revised      PT LONG TERM GOAL #4   Title  Patient will be able to ambulate 240ft with SPC without LOB/need for external support, and without instances of LOB, in order to demonstrate safety walking household distances/into story    Baseline  06/01/19 138ft with SPC, RUE high gaurd, supervision with instances of CGA needed to maintain balance 04/23/19 170ft with SPC and CGA with high gaurd with RUE, 1 instance of incresaed time needed to re-establish balance.    Time  12    Period  Weeks    Status  Revised              Patient will benefit from skilled therapeutic intervention in order to improve the following deficits and impairments:      Visit Diagnosis: No diagnosis found.     Problem List Patient Active Problem List   Diagnosis Date Noted  . Morbid obesity ([place]) 04/08/2018  . Age-related osteoporosis without current pathological fracture 09/26/2017  . Pain in both hands 09/23/2017  . Hyperparathyroidism ([place]) 08/06/2017  . Chronic kidney disease, stage III (moderate) 08/06/2017  . HLD (hyperlipidemia) 07/25/2017  . Osteoporotic compression fracture of spine ([place]) 03/22/2017  . Assistance needed with transportation 03/21/2017  . Iron deficiency anemia 12/19/2016  . Aortic atherosclerosis ([place]) 11/23/2016  . Anterolisthesis 11/23/2016  . Chronic pain syndrome 09/13/2016  . Vitamin D deficiency 08/19/2016  . Anemia, unspecified 08/19/2016  . Primary open angle glaucoma (POAG) of both eyes, severe stage 08/17/2016  . Hypertensive retinopathy 08/17/2016  . Long term current use of opiate analgesic 07/19/2016  . Dyslipidemia associated with type 2 diabetes mellitus ([place]) 04/16/2016  . Chronic midline low back pain without sciatica 04/16/2016  . Chronic pain of right knee 04/16/2016  . Vaginal dryness 02/22/2015  . Calculus of kidney 11/10/2014  . Osteoarthritis, multiple sites 09/09/2014  . Type 2 diabetes mellitus with microalbuminuria ([place]) 09/09/2014  . Glaucoma 09/09/2014  . Benign hypertension with CKD (chronic kidney disease) stage III ([place]) 09/09/2014  . Chronic pain 09/09/2014  . Tinea corporis 09/09/2014  . Umbilical hernia [id number]  . Mass of right breast  Shelton Silvas 06/17/2019, 3:25 PM  [facility] [address], [place], [address] Phone: [phone]   Fax:  [phone]  Name: Taylor [name] MRN: [id number] Date of Birth: 1932-09-17

## 2019-06-19 NOTE — Chronic Care Management (AMB) (Signed)
  Chronic Care Management   Outreach Note  06/19/2019 Name: Taylor [name] MRN: [id number] DOB: September 24, 1932  Taylor [name] is a 86 y.o. year old female who is a primary care patient of [name], MD. I reached out to Taylor [name] by phone today in response to a referral sent by Taylor [name]'s health plan.     A second unsuccessful telephone outreach was attempted today. The patient was referred to the case management team for assistance with care management and care coordination.   Follow Up Plan: A HIPPA compliant phone message was left for the patient providing contact information and requesting a return call.  The care management team will reach out to the patient again over the next 7 days.  If patient returns call to provider office, please advise to call [facility] at [place], [place], [place], [place] [address] Direct Dial: [phone] [email or web address] Website: [email or web address]

## 2019-06-19 NOTE — Chronic Care Management (AMB) (Signed)
Chronic Care Management   Note  06/19/2019 Name: Taylor [name] MRN: [id number] DOB: 03-Nov-1932  Taylor [name] is a 86 y.o. year old female who is a primary care patient of [name], MD. I reached out to [name] by phone today in response to a referral sent by [name] health plan.     [name] was given information about Chronic Care Management services today including:  1. CCM service includes personalized support from designated clinical staff supervised by her physician, including individualized plan of care and coordination with other care providers 2. 24/7 contact phone numbers for assistance for urgent and routine care needs. 3. Service will only be billed when office clinical staff spend 20 minutes or more in a month to coordinate care. 4. Only one practitioner may furnish and bill the service in a calendar month. 5. The patient may stop CCM services at any time (effective at the end of the month) by phone call to the office staff. 6. The patient will be responsible for cost sharing (co-pay) of up to 20% of the service fee (after annual deductible is met).  Patient agreed to services and verbal consent obtained.   Follow up plan: Telephone appointment with care management team member scheduled for:07/08/2019  Noreene Larsson, [place], [place], [place] [address] Direct Dial: [phone] [email or web address]: [email or web address]

## 2019-06-23 ENCOUNTER — Encounter: Payer: Self-pay | Admitting: Physical Therapy

## 2019-06-23 ENCOUNTER — Other Ambulatory Visit: Payer: Self-pay

## 2019-06-23 ENCOUNTER — Ambulatory Visit: Payer: Medicare Other | Attending: Family Medicine | Admitting: Physical Therapy

## 2019-06-23 DIAGNOSIS — M545 Low back pain, unspecified: Secondary | ICD-10-CM

## 2019-06-23 DIAGNOSIS — R262 Difficulty in walking, not elsewhere classified: Secondary | ICD-10-CM | POA: Insufficient documentation

## 2019-06-23 DIAGNOSIS — G8929 Other chronic pain: Secondary | ICD-10-CM | POA: Diagnosis not present

## 2019-06-23 DIAGNOSIS — R296 Repeated falls: Secondary | ICD-10-CM | POA: Diagnosis not present

## 2019-06-23 NOTE — Therapy (Signed)
[facility] [address] [place], [address] Phone: [phone]   Fax:  [phone]  Physical Therapy Treatment  Patient Details  Name: Taylor [name] MRN: [id number] Date of Birth: 1932/04/04 No data recorded  Encounter Date: 06/23/2019  PT End of Session - 06/23/19 1441    Visit Number  27    Number of Visits  37    Date for PT Re-Evaluation  08/24/19    Authorization - Visit Number  6    Authorization - Number of Visits  10    PT Start Time  0237    PT Stop Time  0315    PT Time Calculation (min)  38 min    Equipment Utilized During Treatment  Gait belt    Activity Tolerance  Patient tolerated treatment well    Behavior During Therapy  [facility] for tasks assessed/performed       Past Medical History:  Diagnosis Date  . Abnormal mammogram, unspecified 2013  . Anemia   . Arthritis   . Breast screening, unspecified 2013  . Diabetes mellitus without complication ([place]) [id number]   non insulin dependent  . Dysrhythmia    IRREGULAR HEART BEAT  . GERD (gastroesophageal reflux disease)   . Glaucoma 2003  . Gout   . History of kidney stones    H/O  . Hyperlipidemia 2008  . Hypertension 1980's  . Lump or mass in breast 01/03/2012   left breast  . Obesity, unspecified 2013  . Osteoporosis   . Shingles 2013  . Special screening for malignant neoplasms, colon 2013    Past Surgical History:  Procedure Laterality Date  . ABDOMINAL HYSTERECTOMY  1958   menorrhagia  . BACK SURGERY    . BREAST BIOPSY Left 01/25/2012  . BREAST BIOPSY Right 04/08/2019   hear clip, [place] bx, path pending  . BREAST LUMPECTOMY WITH NEEDLE LOCALIZATION Right 05/12/2019   Procedure: BREAST LUMPECTOMY WITH NEEDLE LOCALIZATION;  Surgeon: Olean Ree, MD;  Location: [facility] ORS;  Service: General;  Laterality: Right;  . CATARACT EXTRACTION, BILATERAL Bilateral    1st 06/07/15 and the 2nd 06/21/15  . CHOLECYSTECTOMY    . COLONOSCOPY  2003    [facility]  . KNEE SURGERY  2009,2011,2012   twice on right and once on left  . KNEE SURGERY    . LIPOMA EXCISION  1998  . [place]  2004  . TONSILLECTOMY     age of 12    There were no vitals filed for this visit.  Subjective Assessment - 06/23/19 1440    Subjective  Reports she has been feeling pretty good. She has been doing some of her HEP and does report she has been eating better.    Pertinent History  Patinet is a 86 year old female presenting to clinic for LBP. Patient has LBP that bothers her when she stands up for 10-32mins for ADLS. Back pain does not radiate and feels achy. She can only sit without back support for 19mins without pain. Worst pain in the past week 9/10 best: 5/10. Reports she has had about 3 falls in the past year, and is usually falling backwards. Reports she is very unsteady with stepping onto varied surfaces. Ambulates with a hurry cane in her LUE and uses her RUE to furiture walk, and has a RW that she will use when she has someone to put it in her car with her when she goes out. When she gets  to a store, she uses cart to help her walk. She lives alone and completes her own cooking, cleaning, but has trouble standing for the time it takes to cook/do dishing. Patient reports she gives herself bird baths in her bathroom because she cannot step over her tub to get into her tub/shower that has a seat in it. She drives and completes her own errands. When her church was open she enjoyed attending, enjoys playing games on her Ipad, and has friends in the community that come and check on her daily, as well as being apart of the "Are You Okay" program with the [place] police (they call daily and if you do not answer they will send the police there to check on you). No steps to enter 1 story home, has a ramp. Denies sensation deficits.  Pt denies N/V, B&B changes, unexplained weight fluctuation, saddle paresthesia, fever, night sweats, or unrelenting night pain at this time.     Limitations  Sitting;Lifting;Standing;Walking    How long can you sit comfortably?  without support 10-60mins    How long can you stand comfortably?  10-34mins    How long can you walk comfortably?  with cane 33mins    Patient Stated Goals  Walk better    Pain Onset  More than a month ago      [place] -Nustep seat setting 6 no UEs L3 keeping spm above 50 for 5 mins with patient able to maintain SPM with encouragement - STS from elevated (nustep seat) without UE 3x 8 with cuing for "quick" stand and controlled lower with good carry over following - Sidestepping 3x 62ft R 77ft L BUE supported with min cuing for foot clearance, good carry over following; standing rest breaks between - Standing alt cone taps (6in)  3x 01/28/09  bilat UE support; supervision for safety, minA for RLE occasionally  - Standing heel raises 2x10 with bilat UE support with good carr yover of cuing to "straight up" without swaying forward  Gait Training Inside through clinic and outside down decline >5% grade to parking lot with curb step down. PT mostly supervision, but had to provide min assist when pt negotiated curb step down; pt able to regain balance within 3 seconds on own                          PT Education - 06/23/19 1441    Education Details  therex form, posture    Person(s) Educated  Patient    Methods  Explanation;Demonstration;Tactile cues;Verbal cues    Comprehension  Verbalized understanding;Returned demonstration;Verbal cues required;Tactile cues required       PT Short Term Goals - 06/01/19 1651      PT SHORT TERM GOAL #1   Title  Pt will be independent with HEP in order to improve strength and decrease back pain in order to improve pain-free function at home and increase QOL    Baseline  06/01/19 HEP reprinted d/t non compliance following surgery 04/23/19 75% compliance subjectively reported from patient 03/03/19 Completing HEP with 50% compliance    Time  4    Period   Weeks    Status  Revised      PT SHORT TERM GOAL #2   Title  Pt will improve BERG by at least 3 points in order to demonstrate clinically significant improvement in balance.    Baseline  06/01/19 28/56    Time  5    Period  Weeks  Status  Revised      PT SHORT TERM GOAL #3   Title  Pt will increase 10MWT by at least 0.13 m/s in order to demonstrate clinically significant improvement in community ambulation.    Baseline  06/01/19 0.87m/s    Time  5    Period  Weeks    Status  Revised      PT SHORT TERM GOAL #4   Title  Pt will be able to complete 5TSTS  in order to demonstrate clinically significant improvement in LE strength    Baseline  06/01/19 unable    Time  6    Period  Weeks    Status  Revised        PT Long Term Goals - 06/01/19 1633      PT LONG TERM GOAL #1   Title  Patient will obtain a score of 46/56 on the BERG to demonstrate decreased fall risk    Baseline  06/01/19 28/56 04/23/19 33/56 03/02/54 31/56    Time  12    Period  Weeks    Status  Revised      PT LONG TERM GOAL #2   Title  Patient will demonstrate walk speed of 0.8-1.7m/s on 10MWT in order to demonstrate household ambulator walk speed with safety    Baseline  06/01/19 0.56m/s with supervision and [facility] 04/23/19 0.38m/s with CGA and [facility] 12/30/18 0.47m/s with HHA with SPC, needing min-modA to  Prevent LOB on 4 occasions    Time  12    Period  Weeks    Status  Revised      PT LONG TERM GOAL #3   Title  Patient will complete 5xSTS without UE support in 14.8sec in order to demonstrate age matched strength norms    Baseline  06/01/19 unable to complete 04/23/19 20sec 03/03/19 36sec    Time  12    Period  Weeks    Status  Revised      PT LONG TERM GOAL #4   Title  Patient will be able to ambulate 226ft with SPC without LOB/need for external support, and without instances of LOB, in order to demonstrate safety walking household distances/into story    Baseline  06/01/19 162ft with SPC, RUE high gaurd, supervision  with instances of CGA needed to maintain balance 04/23/19 142ft with SPC and CGA with high gaurd with RUE, 1 instance of incresaed time needed to re-establish balance.    Time  12    Period  Weeks    Status  Revised            Plan - 06/23/19 1444    Clinical Impression Statement  PT continued therex progression for dynamic stability and increased standing tolerance with good success. Patient is able to tolerate all therex without increased pain, and comply with all cuing for technique and safety with good motivation. Patient is increasing foot clearance in therex with decent carry over into gait, which is an improvement. PT will continue progression as able.    Personal Factors and Comorbidities  Age;Fitness;Comorbidity 1;Comorbidity 2;Comorbidity 3+;Past/Current Experience;Sex    Comorbidities  HTN, HLD, GERD, osteoporosis    Examination-Activity Limitations  Bathing;Sit;Dressing;Transfers;Bed Mobility;Bend;Lift;Carry;Reach Overhead;Stand;Stairs    Examination-Participation Restrictions  Church;Laundry;Cleaning;Community Activity;Meal Prep    Stability/Clinical Decision Making  Evolving/Moderate complexity    Clinical Decision Making  Moderate    Rehab Potential  Good    PT Frequency  2x / week    PT Duration  8 weeks  PT Treatment/Interventions  Moist Heat;Traction;Gait training;Stair training;Balance training;Dry needling;Joint Manipulations;Spinal Manipulations;Passive range of motion;Manual techniques;Patient/family education;Therapeutic exercise;Electrical Stimulation;ADLs/Self Care Home Management;Therapeutic activities;Functional mobility training;Energy conservation;Neuromuscular re-education    PT Next Visit Plan  Balance training, LE/core strengthening    PT Home Exercise Plan  STS from chair, seated hip abd, seated marching    Consulted and Agree with Plan of Care  Patient       Patient will benefit from skilled therapeutic intervention in order to improve the following  deficits and impairments:  Abnormal gait, Decreased balance, Decreased endurance, Decreased mobility, Difficulty walking, Hypomobility, Cardiopulmonary status limiting activity, Decreased range of motion, Improper body mechanics, Decreased activity tolerance, Decreased coordination, Decreased safety awareness, Decreased strength, Increased fascial restricitons, Impaired flexibility, Postural dysfunction, Pain  Visit Diagnosis: Difficulty in walking, not elsewhere classified  Chronic midline low back pain without sciatica  Repeated falls     Problem List Patient Active Problem List   Diagnosis Date Noted  . Morbid obesity ([place]) 04/08/2018  . Age-related osteoporosis without current pathological fracture 09/26/2017  . Pain in both hands 09/23/2017  . Hyperparathyroidism ([place]) 08/06/2017  . Chronic kidney disease, stage III (moderate) 08/06/2017  . HLD (hyperlipidemia) 07/25/2017  . Osteoporotic compression fracture of spine ([place]) 03/22/2017  . Assistance needed with transportation 03/21/2017  . Iron deficiency anemia 12/19/2016  . Aortic atherosclerosis ([place]) 11/23/2016  . Anterolisthesis 11/23/2016  . Chronic pain syndrome 09/13/2016  . Vitamin D deficiency 08/19/2016  . Anemia, unspecified 08/19/2016  . Primary open angle glaucoma (POAG) of both eyes, severe stage 08/17/2016  . Hypertensive retinopathy 08/17/2016  . Long term current use of opiate analgesic 07/19/2016  . Dyslipidemia associated with type 2 diabetes mellitus ([place]) 04/16/2016  . Chronic midline low back pain without sciatica 04/16/2016  . Chronic pain of right knee 04/16/2016  . Vaginal dryness 02/22/2015  . Calculus of kidney 11/10/2014  . Osteoarthritis, multiple sites 09/09/2014  . Type 2 diabetes mellitus with microalbuminuria ([place]) 09/09/2014  . Glaucoma 09/09/2014  . Benign hypertension with CKD (chronic kidney disease) stage III ([place]) 09/09/2014  . Chronic pain 09/09/2014  . Tinea corporis 09/09/2014   . Umbilical hernia [id number]  . Mass of right breast    Shelton Silvas PT, DPT Shelton Silvas 06/23/2019, 3:14 PM  [facility] [address], [place], [address] Phone: [phone]   Fax:  [phone]  Name: Taylor [name] MRN: [id number] Date of Birth: [birth year]

## 2019-06-25 ENCOUNTER — Other Ambulatory Visit: Payer: Self-pay

## 2019-06-25 ENCOUNTER — Ambulatory Visit: Payer: Medicare Other | Admitting: Physical Therapy

## 2019-06-25 ENCOUNTER — Ambulatory Visit: Payer: Medicare Other | Admitting: Gastroenterology

## 2019-06-25 ENCOUNTER — Encounter: Payer: Self-pay | Admitting: Physical Therapy

## 2019-06-25 DIAGNOSIS — M545 Low back pain, unspecified: Secondary | ICD-10-CM

## 2019-06-25 DIAGNOSIS — G8929 Other chronic pain: Secondary | ICD-10-CM

## 2019-06-25 DIAGNOSIS — R296 Repeated falls: Secondary | ICD-10-CM

## 2019-06-25 DIAGNOSIS — R262 Difficulty in walking, not elsewhere classified: Secondary | ICD-10-CM

## 2019-06-25 NOTE — Therapy (Signed)
[facility] [address] [place], [address] Phone: [phone]   Fax:  [phone]  Physical Therapy Treatment  Patient Details  Name: Taylor [name] MRN: [id number] Date of Birth: 07/03/1932 No data recorded  Encounter Date: 06/25/2019  PT End of Session - 06/25/19 1505    Visit Number  28    Number of Visits  85    Date for PT Re-Evaluation  08/24/19    Authorization - Visit Number  7    Authorization - Number of Visits  10    PT Start Time  0300    PT Stop Time  0338    PT Time Calculation (min)  38 min    Equipment Utilized During Treatment  Gait belt    Activity Tolerance  Patient tolerated treatment well    Behavior During Therapy  [facility] for tasks assessed/performed       Past Medical History:  Diagnosis Date  . Abnormal mammogram, unspecified 2013  . Anemia   . Arthritis   . Breast screening, unspecified 2013  . Diabetes mellitus without complication ([place]) [id number]   non insulin dependent  . Dysrhythmia    IRREGULAR HEART BEAT  . GERD (gastroesophageal reflux disease)   . Glaucoma 2003  . Gout   . History of kidney stones    H/O  . Hyperlipidemia 2008  . Hypertension 1980's  . Lump or mass in breast 01/03/2012   left breast  . Obesity, unspecified 2013  . Osteoporosis   . Shingles 2013  . Special screening for malignant neoplasms, colon 2013    Past Surgical History:  Procedure Laterality Date  . ABDOMINAL HYSTERECTOMY  1958   menorrhagia  . BACK SURGERY    . BREAST BIOPSY Left 01/25/2012  . BREAST BIOPSY Right 04/08/2019   hear clip, [place] bx, path pending  . BREAST LUMPECTOMY WITH NEEDLE LOCALIZATION Right 05/12/2019   Procedure: BREAST LUMPECTOMY WITH NEEDLE LOCALIZATION;  Surgeon: Olean Ree, MD;  Location: [facility] ORS;  Service: General;  Laterality: Right;  . CATARACT EXTRACTION, BILATERAL Bilateral    1st 06/07/15 and the 2nd 06/21/15  . CHOLECYSTECTOMY    . COLONOSCOPY  2003   [facility]  . KNEE SURGERY  2009,2011,2012   twice on right and once on left  . KNEE SURGERY    . LIPOMA EXCISION  1998  . [place]  2004  . TONSILLECTOMY     age of 59    There were no vitals filed for this visit.  Subjective Assessment - 06/25/19 1503    Subjective  Reports she is doing well today. She is still not eating very well. Reports some compliance with HEP.    Pertinent History  Patinet is a 86 year old female presenting to clinic for LBP. Patient has LBP that bothers her when she stands up for 10-62mins for ADLS. Back pain does not radiate and feels achy. She can only sit without back support for 51mins without pain. Worst pain in the past week 9/10 best: 5/10. Reports she has had about 3 falls in the past year, and is usually falling backwards. Reports she is very unsteady with stepping onto varied surfaces. Ambulates with a hurry cane in her LUE and uses her RUE to furiture walk, and has a RW that she will use when she has someone to put it in her car with her when she goes out. When she gets to a store, she uses cart  to help her walk. She lives alone and completes her own cooking, cleaning, but has trouble standing for the time it takes to cook/do dishing. Patient reports she gives herself bird baths in her bathroom because she cannot step over her tub to get into her tub/shower that has a seat in it. She drives and completes her own errands. When her church was open she enjoyed attending, enjoys playing games on her Ipad, and has friends in the community that come and check on her daily, as well as being apart of the "Are You Okay" program with the [place] police (they call daily and if you do not answer they will send the police there to check on you). No steps to enter 1 story home, has a ramp. Denies sensation deficits.  Pt denies N/V, B&B changes, unexplained weight fluctuation, saddle paresthesia, fever, night sweats, or unrelenting night pain at this time.    Limitations   Sitting;Lifting;Standing;Walking    How long can you sit comfortably?  without support 10-30mins    How long can you stand comfortably?  10-16mins    How long can you walk comfortably?  with cane 64mins    Patient Stated Goals  Walk better    Pain Onset  More than a month ago         [place] -Nustep seat setting 6 no UEs L3 keeping spm above92for 2mins; L4 92mins SPM above 30; with patient able to maintain SPM with encouragement - STS from elevated (nustep seat) without UE 2x 10 with cuing for "quick" stand and controlled lower with good carry over following - Standing hip abd 2x 10 bilat with BUE support and min cuing for posture with good carry over; standing rests between  - Standing heel raises 2x10 with BUE support; standing rest between   Gait Training 234ft SPC with CGA for safety 148ft with category listing, and 166ft with speed changes with 1x standing rest and multiple instances of increased time needed to re-establish balance with speed changes Inside through clinic and outside down decline >5% grade to parking lot with curb step down. PT mostly supervision, but had to provide min assist when pt negotiated curb step down; pt able to regain balance within 3 seconds on own                       PT Education - 06/25/19 1505    Education Details  therex form; posure    Person(s) Educated  Patient    Methods  Explanation;Demonstration;Verbal cues    Comprehension  Verbalized understanding;Returned demonstration;Verbal cues required       PT Short Term Goals - 06/01/19 1651      PT SHORT TERM GOAL #1   Title  Pt will be independent with HEP in order to improve strength and decrease back pain in order to improve pain-free function at home and increase QOL    Baseline  06/01/19 HEP reprinted d/t non compliance following surgery 04/23/19 75% compliance subjectively reported from patient 03/03/19 Completing HEP with 50% compliance    Time  4    Period  Weeks     Status  Revised      PT SHORT TERM GOAL #2   Title  Pt will improve BERG by at least 3 points in order to demonstrate clinically significant improvement in balance.    Baseline  06/01/19 28/56    Time  5    Period  Weeks    Status  Revised      PT SHORT TERM GOAL #3   Title  Pt will increase 10MWT by at least 0.13 m/s in order to demonstrate clinically significant improvement in community ambulation.    Baseline  06/01/19 0.11m/s    Time  5    Period  Weeks    Status  Revised      PT SHORT TERM GOAL #4   Title  Pt will be able to complete 5TSTS  in order to demonstrate clinically significant improvement in LE strength    Baseline  06/01/19 unable    Time  6    Period  Weeks    Status  Revised        PT Long Term Goals - 06/01/19 1633      PT LONG TERM GOAL #1   Title  Patient will obtain a score of 46/56 on the BERG to demonstrate decreased fall risk    Baseline  06/01/19 28/56 04/23/19 33/56 03/02/54 31/56    Time  12    Period  Weeks    Status  Revised      PT LONG TERM GOAL #2   Title  Patient will demonstrate walk speed of 0.8-1.52m/s on 10MWT in order to demonstrate household ambulator walk speed with safety    Baseline  06/01/19 0.64m/s with supervision and [facility] 04/23/19 0.43m/s with CGA and [facility] 12/30/18 0.49m/s with HHA with SPC, needing min-modA to  Prevent LOB on 4 occasions    Time  12    Period  Weeks    Status  Revised      PT LONG TERM GOAL #3   Title  Patient will complete 5xSTS without UE support in 14.8sec in order to demonstrate age matched strength norms    Baseline  06/01/19 unable to complete 04/23/19 20sec 03/03/19 36sec    Time  12    Period  Weeks    Status  Revised      PT LONG TERM GOAL #4   Title  Patient will be able to ambulate 258ft with SPC without LOB/need for external support, and without instances of LOB, in order to demonstrate safety walking household distances/into story    Baseline  06/01/19 178ft with SPC, RUE high gaurd, supervision with  instances of CGA needed to maintain balance 04/23/19 118ft with SPC and CGA with high gaurd with RUE, 1 instance of incresaed time needed to re-establish balance.    Time  12    Period  Weeks    Status  Revised            Plan - 06/25/19 1527    Clinical Impression Statement  PT continued therex progression for increased LE strength and endurance with good success. Pt demonstrates good motivation throughout session with no increased pain. Patient does require gaurding for safety and cuing for technique of therex, but is able to comply and maintain safety throughout session. PT will continue progression as able.    Personal Factors and Comorbidities  Age;Fitness;Comorbidity 1;Comorbidity 2;Comorbidity 3+;Past/Current Experience;Sex    Comorbidities  HTN, HLD, GERD, osteoporosis    Examination-Activity Limitations  Bathing;Sit;Dressing;Transfers;Bed Mobility;Bend;Lift;Carry;Reach Overhead;Stand;Stairs    Examination-Participation Restrictions  Church;Laundry;Cleaning;Community Activity;Meal Prep    Stability/Clinical Decision Making  Evolving/Moderate complexity    Clinical Decision Making  Moderate    Rehab Potential  Good    PT Frequency  2x / week    PT Duration  8 weeks    PT Treatment/Interventions  Moist Heat;Traction;Gait training;Stair training;Balance training;Dry  needling;Joint Manipulations;Spinal Manipulations;Passive range of motion;Manual techniques;Patient/family education;Therapeutic exercise;Electrical Stimulation;ADLs/Self Care Home Management;Therapeutic activities;Functional mobility training;Energy conservation;Neuromuscular re-education    PT Next Visit Plan  Balance training, LE/core strengthening    PT Home Exercise Plan  STS from chair, seated hip abd, seated marching    Consulted and Agree with Plan of Care  Patient       Patient will benefit from skilled therapeutic intervention in order to improve the following deficits and impairments:  Abnormal gait, Decreased  balance, Decreased endurance, Decreased mobility, Difficulty walking, Hypomobility, Cardiopulmonary status limiting activity, Decreased range of motion, Improper body mechanics, Decreased activity tolerance, Decreased coordination, Decreased safety awareness, Decreased strength, Increased fascial restricitons, Impaired flexibility, Postural dysfunction, Pain  Visit Diagnosis: Difficulty in walking, not elsewhere classified  Chronic midline low back pain without sciatica  Repeated falls     Problem List Patient Active Problem List   Diagnosis Date Noted  . Morbid obesity ([place]) 04/08/2018  . Age-related osteoporosis without current pathological fracture 09/26/2017  . Pain in both hands 09/23/2017  . Hyperparathyroidism ([place]) 08/06/2017  . Chronic kidney disease, stage III (moderate) 08/06/2017  . HLD (hyperlipidemia) 07/25/2017  . Osteoporotic compression fracture of spine ([place]) 03/22/2017  . Assistance needed with transportation 03/21/2017  . Iron deficiency anemia 12/19/2016  . Aortic atherosclerosis ([place]) 11/23/2016  . Anterolisthesis 11/23/2016  . Chronic pain syndrome 09/13/2016  . Vitamin D deficiency 08/19/2016  . Anemia, unspecified 08/19/2016  . Primary open angle glaucoma (POAG) of both eyes, severe stage 08/17/2016  . Hypertensive retinopathy 08/17/2016  . Long term current use of opiate analgesic 07/19/2016  . Dyslipidemia associated with type 2 diabetes mellitus ([place]) 04/16/2016  . Chronic midline low back pain without sciatica 04/16/2016  . Chronic pain of right knee 04/16/2016  . Vaginal dryness 02/22/2015  . Calculus of kidney 11/10/2014  . Osteoarthritis, multiple sites 09/09/2014  . Type 2 diabetes mellitus with microalbuminuria ([place]) 09/09/2014  . Glaucoma 09/09/2014  . Benign hypertension with CKD (chronic kidney disease) stage III ([place]) 09/09/2014  . Chronic pain 09/09/2014  . Tinea corporis 09/09/2014  . Umbilical hernia [id number]  . Mass of right  breast    Shelton Silvas PT, DPT Shelton Silvas 06/25/2019, 3:31 PM  [facility] [address] [place], [address] Phone: [phone]   Fax:  [phone]  Name: Taylor [name] MRN: [id number] Date of Birth: April 22, 1932

## 2019-06-30 ENCOUNTER — Other Ambulatory Visit
Admission: RE | Admit: 2019-06-30 | Discharge: 2019-06-30 | Disposition: A | Discharge status: Home / Self Care | Payer: Medicare Other | Source: Ambulatory Visit | Attending: Gastroenterology | Admitting: Gastroenterology

## 2019-06-30 ENCOUNTER — Other Ambulatory Visit: Payer: Self-pay

## 2019-06-30 ENCOUNTER — Encounter: Payer: Self-pay | Admitting: Physical Therapy

## 2019-06-30 ENCOUNTER — Ambulatory Visit: Payer: Medicare Other | Admitting: Physical Therapy

## 2019-06-30 DIAGNOSIS — R262 Difficulty in walking, not elsewhere classified: Secondary | ICD-10-CM

## 2019-06-30 DIAGNOSIS — G8929 Other chronic pain: Secondary | ICD-10-CM | POA: Diagnosis not present

## 2019-06-30 DIAGNOSIS — M545 Low back pain, unspecified: Secondary | ICD-10-CM

## 2019-06-30 DIAGNOSIS — Z20822 Contact with and (suspected) exposure to covid-19: Secondary | ICD-10-CM | POA: Insufficient documentation

## 2019-06-30 DIAGNOSIS — Z01812 Encounter for preprocedural laboratory examination: Secondary | ICD-10-CM | POA: Diagnosis not present

## 2019-06-30 DIAGNOSIS — R296 Repeated falls: Secondary | ICD-10-CM | POA: Diagnosis not present

## 2019-06-30 NOTE — Therapy (Signed)
[facility] [address] [place], [address] Phone: [phone]   Fax:  [phone]  Physical Therapy Treatment  Patient Details  Name: Taylor [name] MRN: [id number] Date of Birth: [birth year] No data recorded  Encounter Date: 06/30/2019  PT End of Session - 06/30/19 1442    Visit Number  29    Number of Visits  71    Date for PT Re-Evaluation  08/24/19    Authorization - Visit Number  8    Authorization - Number of Visits  10    PT Start Time  0230    PT Stop Time  0315    PT Time Calculation (min)  45 min    Equipment Utilized During Treatment  Gait belt    Activity Tolerance  Patient tolerated treatment well    Behavior During Therapy  WFL for tasks assessed/performed       Past Medical History:  Diagnosis Date  . Abnormal mammogram, unspecified 2013  . Anemia   . Arthritis   . Breast screening, unspecified 2013  . Diabetes mellitus without complication ([place]) [id number]   non insulin dependent  . Dysrhythmia    IRREGULAR HEART BEAT  . GERD (gastroesophageal reflux disease)   . Glaucoma 2003  . Gout   . History of kidney stones    H/O  . Hyperlipidemia 2008  . Hypertension 1980's  . Lump or mass in breast 01/03/2012   left breast  . Obesity, unspecified 2013  . Osteoporosis   . Shingles 2013  . Special screening for malignant neoplasms, colon 2013    Past Surgical History:  Procedure Laterality Date  . ABDOMINAL HYSTERECTOMY  1958   menorrhagia  . BACK SURGERY    . BREAST BIOPSY Left 01/25/2012  . BREAST BIOPSY Right 04/08/2019   hear clip, [place] bx, path pending  . BREAST LUMPECTOMY WITH NEEDLE LOCALIZATION Right 05/12/2019   Procedure: BREAST LUMPECTOMY WITH NEEDLE LOCALIZATION;  Surgeon: Olean Ree, MD;  Location: [facility] ORS;  Service: General;  Laterality: Right;  . CATARACT EXTRACTION, BILATERAL Bilateral    1st 06/07/15 and the 2nd 06/21/15  . CHOLECYSTECTOMY    . COLONOSCOPY  2003    [facility]  . KNEE SURGERY  2009,2011,2012   twice on right and once on left  . KNEE SURGERY    . LIPOMA EXCISION  1998  . [place]  2004  . TONSILLECTOMY     age of 14    There were no vitals filed for this visit.  Subjective Assessment - 06/30/19 1440    Subjective  Reports some compliance with HEP, without pain today.    Pertinent History  Patinet is a 86 year old female presenting to clinic for LBP. Patient has LBP that bothers her when she stands up for 10-48mins for ADLS. Back pain does not radiate and feels achy. She can only sit without back support for 70mins without pain. Worst pain in the past week 9/10 best: 5/10. Reports she has had about 3 falls in the past year, and is usually falling backwards. Reports she is very unsteady with stepping onto varied surfaces. Ambulates with a hurry cane in her LUE and uses her RUE to furiture walk, and has a RW that she will use when she has someone to put it in her car with her when she goes out. When she gets to a store, she uses cart to help her walk. She lives alone and completes  her own cooking, cleaning, but has trouble standing for the time it takes to cook/do dishing. Patient reports she gives herself bird baths in her bathroom because she cannot step over her tub to get into her tub/shower that has a seat in it. She drives and completes her own errands. When her church was open she enjoyed attending, enjoys playing games on her Ipad, and has friends in the community that come and check on her daily, as well as being apart of the "Are You Okay" program with the [place] police (they call daily and if you do not answer they will send the police there to check on you). No steps to enter 1 story home, has a ramp. Denies sensation deficits.  Pt denies N/V, B&B changes, unexplained weight fluctuation, saddle paresthesia, fever, night sweats, or unrelenting night pain at this time.    Limitations  Sitting;Lifting;Standing;Walking    How long can you  sit comfortably?  without support 10-4mins    How long can you stand comfortably?  10-17mins    How long can you walk comfortably?  with cane 20mins    Patient Stated Goals  Walk better        THEREX -Nustepseat setting 6 no UEsL3keeping spm above68for3mins; L4 23mins SPM above 30; with patient able to maintain SPM with encouragement - STS from elevated (nustep seat) without UE2x 10 with min cuing for "quick" stand and controlled lower with good carry over following - Standing alt trunk rotations with BUE flex yellow theraball x20CGA for safety; 1x controlled sit to prevent LOB                - Standing heel raises 2x10 with BUE support; standing rest between; cuing to go "straight up not forward" good carry over  Gait Training 251ft SPC with CGA for safety 17ft with counting backward by 2, and 124ft with speed changes with 1x standing rest and multiple instances of increased time needed to re-establish balance with speed changes Inside through clinic and outside down decline >5% grade to parking lot with curb step down. PT mostly supervision, but occasional CGA for safety                           PT Education - 06/30/19 1440    Education Details  therex form; gait training    Person(s) Educated  Patient    Methods  Explanation;Demonstration;Verbal cues;Tactile cues    Comprehension  Verbalized understanding;Returned demonstration;Verbal cues required;Tactile cues required       PT Short Term Goals - 06/01/19 1651      PT SHORT TERM GOAL #1   Title  Pt will be independent with HEP in order to improve strength and decrease back pain in order to improve pain-free function at home and increase QOL    Baseline  06/01/19 HEP reprinted d/t non compliance following surgery 04/23/19 75% compliance subjectively reported from patient 03/03/19 Completing HEP with 50% compliance    Time  4    Period  Weeks    Status  Revised      PT SHORT TERM GOAL #2   Title   Pt will improve BERG by at least 3 points in order to demonstrate clinically significant improvement in balance.    Baseline  06/01/19 28/56    Time  5    Period  Weeks    Status  Revised      PT SHORT TERM GOAL #3  Title  Pt will increase 10MWT by at least 0.13 m/s in order to demonstrate clinically significant improvement in community ambulation.    Baseline  06/01/19 0.11m/s    Time  5    Period  Weeks    Status  Revised      PT SHORT TERM GOAL #4   Title  Pt will be able to complete 5TSTS  in order to demonstrate clinically significant improvement in LE strength    Baseline  06/01/19 unable    Time  6    Period  Weeks    Status  Revised        PT Long Term Goals - 06/01/19 1633      PT LONG TERM GOAL #1   Title  Patient will obtain a score of 46/56 on the BERG to demonstrate decreased fall risk    Baseline  06/01/19 28/56 04/23/19 33/56 03/02/54 31/56    Time  12    Period  Weeks    Status  Revised      PT LONG TERM GOAL #2   Title  Patient will demonstrate walk speed of 0.8-1.32m/s on 10MWT in order to demonstrate household ambulator walk speed with safety    Baseline  06/01/19 0.85m/s with supervision and [facility] 04/23/19 0.37m/s with CGA and [facility] 12/30/18 0.26m/s with HHA with SPC, needing min-modA to  Prevent LOB on 4 occasions    Time  12    Period  Weeks    Status  Revised      PT LONG TERM GOAL #3   Title  Patient will complete 5xSTS without UE support in 14.8sec in order to demonstrate age matched strength norms    Baseline  06/01/19 unable to complete 04/23/19 20sec 03/03/19 36sec    Time  12    Period  Weeks    Status  Revised      PT LONG TERM GOAL #4   Title  Patient will be able to ambulate 2100ft with SPC without LOB/need for external support, and without instances of LOB, in order to demonstrate safety walking household distances/into story    Baseline  06/01/19 1106ft with SPC, RUE high gaurd, supervision with instances of CGA needed to maintain balance 04/23/19 144ft with  SPC and CGA with high gaurd with RUE, 1 instance of incresaed time needed to re-establish balance.    Time  12    Period  Weeks    Status  Revised            Plan - 06/30/19 1457    Clinical Impression Statement  PT continued therex progression for increased BLE strength and endurance with success. Pt demonstrates good carry of of all cuing for proper therex techniq with good motivation, and no inccreased pain throughout session. Pt does require gaurding for safety throughout session. PT will continue progression as able.    Personal Factors and Comorbidities  Age;Fitness;Comorbidity 1;Comorbidity 2;Comorbidity 3+;Past/Current Experience;Sex    Comorbidities  HTN, HLD, GERD, osteoporosis    Examination-Activity Limitations  Bathing;Sit;Dressing;Transfers;Bed Mobility;Bend;Lift;Carry;Reach Overhead;Stand;Stairs    Examination-Participation Restrictions  Church;Laundry;Cleaning;Community Activity;Meal Prep    Stability/Clinical Decision Making  Evolving/Moderate complexity    Clinical Decision Making  Moderate    Rehab Potential  Good    PT Frequency  2x / week    PT Duration  8 weeks    PT Treatment/Interventions  Moist Heat;Traction;Gait training;Stair training;Balance training;Dry needling;Joint Manipulations;Spinal Manipulations;Passive range of motion;Manual techniques;Patient/family education;Therapeutic exercise;Electrical Stimulation;ADLs/Self Care Home Management;Therapeutic activities;Functional mobility training;Energy conservation;Neuromuscular  re-education    PT Next Visit Plan  Balance training, LE/core strengthening    PT Home Exercise Plan  STS from chair, seated hip abd, seated marching    Consulted and Agree with Plan of Care  Patient       Patient will benefit from skilled therapeutic intervention in order to improve the following deficits and impairments:  Abnormal gait, Decreased balance, Decreased endurance, Decreased mobility, Difficulty walking, Hypomobility,  Cardiopulmonary status limiting activity, Decreased range of motion, Improper body mechanics, Decreased activity tolerance, Decreased coordination, Decreased safety awareness, Decreased strength, Increased fascial restricitons, Impaired flexibility, Postural dysfunction, Pain  Visit Diagnosis: Difficulty in walking, not elsewhere classified  Chronic midline low back pain without sciatica  Repeated falls     Problem List Patient Active Problem List   Diagnosis Date Noted  . Morbid obesity ([place]) 04/08/2018  . Age-related osteoporosis without current pathological fracture 09/26/2017  . Pain in both hands 09/23/2017  . Hyperparathyroidism ([place]) 08/06/2017  . Chronic kidney disease, stage III (moderate) 08/06/2017  . HLD (hyperlipidemia) 07/25/2017  . Osteoporotic compression fracture of spine ([place]) 03/22/2017  . Assistance needed with transportation 03/21/2017  . Iron deficiency anemia 12/19/2016  . Aortic atherosclerosis ([place]) 11/23/2016  . Anterolisthesis 11/23/2016  . Chronic pain syndrome 09/13/2016  . Vitamin D deficiency 08/19/2016  . Anemia, unspecified 08/19/2016  . Primary open angle glaucoma (POAG) of both eyes, severe stage 08/17/2016  . Hypertensive retinopathy 08/17/2016  . Long term current use of opiate analgesic 07/19/2016  . Dyslipidemia associated with type 2 diabetes mellitus ([place]) 04/16/2016  . Chronic midline low back pain without sciatica 04/16/2016  . Chronic pain of right knee 04/16/2016  . Vaginal dryness 02/22/2015  . Calculus of kidney 11/10/2014  . Osteoarthritis, multiple sites 09/09/2014  . Type 2 diabetes mellitus with microalbuminuria ([place]) 09/09/2014  . Glaucoma 09/09/2014  . Benign hypertension with CKD (chronic kidney disease) stage III ([place]) 09/09/2014  . Chronic pain 09/09/2014  . Tinea corporis 09/09/2014  . Umbilical hernia [id number]  . Mass of right breast    Shelton Silvas PT, DPT Shelton Silvas 06/30/2019, 3:13 PM  [facility] [address], [place], [address] Phone: [phone]   Fax:  [phone]  Name: TEHANI TEDDER MRN: [id number] Date of Birth: 1932/06/19

## 2019-07-01 LAB — SARS CORONAVIRUS 2 (TAT 6-24 HRS): SARS Coronavirus 2: NEGATIVE

## 2019-07-02 ENCOUNTER — Encounter: Payer: Self-pay | Admitting: Gastroenterology

## 2019-07-02 ENCOUNTER — Other Ambulatory Visit: Payer: Self-pay | Admitting: Family Medicine

## 2019-07-02 ENCOUNTER — Ambulatory Visit: Payer: Medicare Other | Admitting: Anesthesiology

## 2019-07-02 ENCOUNTER — Encounter
Admission: RE | Disposition: A | Discharge status: Home / Self Care | Payer: Self-pay | Source: Home / Self Care | Attending: Gastroenterology

## 2019-07-02 ENCOUNTER — Ambulatory Visit: Payer: Medicare Other | Admitting: Physical Therapy

## 2019-07-02 ENCOUNTER — Other Ambulatory Visit: Payer: Self-pay

## 2019-07-02 ENCOUNTER — Ambulatory Visit
Admission: RE | Admit: 2019-07-02 | Discharge: 2019-07-02 | Disposition: A | Discharge status: Home / Self Care | Payer: Medicare Other | Attending: Gastroenterology | Admitting: Gastroenterology

## 2019-07-02 DIAGNOSIS — E785 Hyperlipidemia, unspecified: Secondary | ICD-10-CM | POA: Insufficient documentation

## 2019-07-02 DIAGNOSIS — E669 Obesity, unspecified: Secondary | ICD-10-CM | POA: Insufficient documentation

## 2019-07-02 DIAGNOSIS — D132 Benign neoplasm of duodenum: Secondary | ICD-10-CM | POA: Diagnosis not present

## 2019-07-02 DIAGNOSIS — K648 Other hemorrhoids: Secondary | ICD-10-CM | POA: Diagnosis not present

## 2019-07-02 DIAGNOSIS — R011 Cardiac murmur, unspecified: Secondary | ICD-10-CM

## 2019-07-02 DIAGNOSIS — D123 Benign neoplasm of transverse colon: Secondary | ICD-10-CM | POA: Insufficient documentation

## 2019-07-02 DIAGNOSIS — K635 Polyp of colon: Secondary | ICD-10-CM

## 2019-07-02 DIAGNOSIS — Z8249 Family history of ischemic heart disease and other diseases of the circulatory system: Secondary | ICD-10-CM | POA: Diagnosis not present

## 2019-07-02 DIAGNOSIS — Z6835 Body mass index (BMI) 35.0-35.9, adult: Secondary | ICD-10-CM | POA: Diagnosis not present

## 2019-07-02 DIAGNOSIS — I10 Essential (primary) hypertension: Secondary | ICD-10-CM | POA: Insufficient documentation

## 2019-07-02 DIAGNOSIS — K219 Gastro-esophageal reflux disease without esophagitis: Secondary | ICD-10-CM | POA: Diagnosis not present

## 2019-07-02 DIAGNOSIS — I129 Hypertensive chronic kidney disease with stage 1 through stage 4 chronic kidney disease, or unspecified chronic kidney disease: Secondary | ICD-10-CM | POA: Diagnosis not present

## 2019-07-02 DIAGNOSIS — D509 Iron deficiency anemia, unspecified: Secondary | ICD-10-CM | POA: Diagnosis not present

## 2019-07-02 DIAGNOSIS — Z79899 Other long term (current) drug therapy: Secondary | ICD-10-CM | POA: Diagnosis not present

## 2019-07-02 DIAGNOSIS — Z9049 Acquired absence of other specified parts of digestive tract: Secondary | ICD-10-CM | POA: Diagnosis not present

## 2019-07-02 DIAGNOSIS — H409 Unspecified glaucoma: Secondary | ICD-10-CM | POA: Diagnosis not present

## 2019-07-02 DIAGNOSIS — R634 Abnormal weight loss: Secondary | ICD-10-CM

## 2019-07-02 DIAGNOSIS — D131 Benign neoplasm of stomach: Secondary | ICD-10-CM | POA: Insufficient documentation

## 2019-07-02 DIAGNOSIS — M1990 Unspecified osteoarthritis, unspecified site: Secondary | ICD-10-CM | POA: Insufficient documentation

## 2019-07-02 DIAGNOSIS — E119 Type 2 diabetes mellitus without complications: Secondary | ICD-10-CM | POA: Diagnosis not present

## 2019-07-02 DIAGNOSIS — K317 Polyp of stomach and duodenum: Secondary | ICD-10-CM

## 2019-07-02 DIAGNOSIS — Z7984 Long term (current) use of oral hypoglycemic drugs: Secondary | ICD-10-CM | POA: Diagnosis not present

## 2019-07-02 HISTORY — PX: ESOPHAGOGASTRODUODENOSCOPY (EGD) WITH PROPOFOL: SHX5813

## 2019-07-02 HISTORY — PX: COLONOSCOPY WITH PROPOFOL: SHX5780

## 2019-07-02 LAB — GLUCOSE, CAPILLARY: Glucose-Capillary: 144 mg/dL — ABNORMAL HIGH (ref 70–99)

## 2019-07-02 SURGERY — COLONOSCOPY WITH PROPOFOL
Anesthesia: General

## 2019-07-02 MED ORDER — PROPOFOL 500 MG/50ML IV EMUL
INTRAVENOUS | Status: DC | PRN
  Administered 2019-07-02: 80 ug/kg/min via INTRAVENOUS

## 2019-07-02 MED ORDER — PROPOFOL 500 MG/50ML IV EMUL
INTRAVENOUS | Status: AC
  Filled 2019-07-02: qty 50

## 2019-07-02 MED ORDER — EPHEDRINE 5 MG/ML INJ
INTRAVENOUS | Status: AC
  Filled 2019-07-02: qty 4

## 2019-07-02 MED ORDER — EPHEDRINE SULFATE 50 MG/ML IJ SOLN
INTRAMUSCULAR | Status: DC | PRN
  Administered 2019-07-02 (×2): 10 mg via INTRAVENOUS

## 2019-07-02 MED ORDER — PROPOFOL 10 MG/ML IV BOLUS
INTRAVENOUS | Status: AC
  Filled 2019-07-02: qty 20

## 2019-07-02 MED ORDER — SODIUM CHLORIDE 0.9 % IV SOLN
INTRAVENOUS | Status: DC
  Administered 2019-07-02: 1000 mL via INTRAVENOUS

## 2019-07-02 MED ORDER — PROPOFOL 10 MG/ML IV BOLUS
INTRAVENOUS | Status: DC | PRN
  Administered 2019-07-02: 20 mg via INTRAVENOUS
  Administered 2019-07-02: 40 mg via INTRAVENOUS

## 2019-07-02 NOTE — Op Note (Addendum)
[facility]enterology Patient Name: Taylor [name] Procedure Date: 07/02/2019 11:09 AM MRN: [id number] Account #: [id number] Date of Birth: [birth year] Admit Type: Outpatient Age: 86 Room: [facility] ENDO ROOM 1 Gender: Female Note Status: Finalized Procedure:             Upper GI endoscopy Indications:           Iron deficiency anemia Providers:             Leotis Isham B. Bonna Gains MD, MD Medicines:             Monitored Anesthesia Care Complications:         No immediate complications. Procedure:             Pre-Anesthesia Assessment:                        - Prior to the procedure, a History and Physical was                         performed, and patient medications, allergies and                         sensitivities were reviewed. The patient's tolerance                         of previous anesthesia was reviewed.                        - The risks and benefits of the procedure and the                         sedation options and risks were discussed with the                         patient. All questions were answered and informed                         consent was obtained.                        - Patient identification and proposed procedure were                         verified prior to the procedure by the physician, the                         nurse, the anesthesiologist, the anesthetist and the                         technician. The procedure was verified in the                         procedure room.                        - ASA Grade Assessment: II - A patient with mild                         systemic disease.  After obtaining informed consent, the endoscope was                         passed under direct vision. Throughout the procedure,                         the patient's blood pressure, pulse, and oxygen                         saturations were monitored continuously. The Endoscope                         was introduced  through the mouth, and advanced to the                         second part of duodenum. The upper GI endoscopy was                         accomplished with ease. The patient tolerated the                         procedure well. Findings:      The examined esophagus was normal.      Two 5 mm sessile polyps with no bleeding and no stigmata of recent       bleeding were found in the gastric body. Biopsies were taken with a cold       forceps for histology.      The entire examined stomach was normal.      The duodenal bulb, second portion of the duodenum and examined duodenum       were normal. Biopsies for histology were taken with a cold forceps for       evaluation of celiac disease. Impression:            - Normal esophagus.                        - Two gastric polyps. Biopsied.                        - Normal stomach.                        - Normal duodenal bulb, second portion of the duodenum                         and examined duodenum. Biopsied. Recommendation:        - Await pathology results.                        - Discharge patient to home (with escort).                        - Advance diet as tolerated.                        - Continue present medications.                        - Patient has a contact number available for  emergencies. The signs and symptoms of potential                         delayed complications were discussed with the patient.                         Return to normal activities tomorrow. Written                         discharge instructions were provided to the patient.                        - Discharge patient to home (with escort).                        - The findings and recommendations were discussed with                         the patient.                        - The findings and recommendations were discussed with                         the patient's family. Procedure Code(s):     --- Professional ---                         [id number], Esophagogastroduodenoscopy, flexible,                         transoral; with biopsy, single or multiple Diagnosis Code(s):     --- Professional ---                        K31.7, Polyp of stomach and duodenum                        D50.9, Iron deficiency anemia, unspecified CPT copyright 2019 American Medical Association. All rights reserved. The codes documented in this report are preliminary and upon coder review may  be revised to meet current compliance requirements.  [name], MD Margretta Sidle B. Bonna Gains MD, MD 07/02/2019 11:36:39 AM This report has been signed electronically. Number of Addenda: 0 Note Initiated On: 07/02/2019 11:09 AM Estimated Blood Loss:  Estimated blood loss: none.      [facility]

## 2019-07-02 NOTE — Op Note (Addendum)
[facility] Gastroenterology Patient Name: Taylor [name] Procedure Date: 07/02/2019 11:09 AM MRN: [id number] Account #: [id number] Date of Birth: 1932/10/14 Admit Type: Outpatient Age: 86 Room: [facility] ENDO ROOM 1 Gender: Female Note Status: Finalized Procedure:             Colonoscopy Indications:           Iron deficiency anemia Providers:             Karlene Southard B. Bonna Gains MD, MD Medicines:             Monitored Anesthesia Care Complications:         No immediate complications. Procedure:             Pre-Anesthesia Assessment:                        - ASA Grade Assessment: II - A patient with mild                         systemic disease.                        - Prior to the procedure, a History and Physical was                         performed, and patient medications, allergies and                         sensitivities were reviewed. The patient's tolerance                         of previous anesthesia was reviewed.                        - The risks and benefits of the procedure and the                         sedation options and risks were discussed with the                         patient. All questions were answered and informed                         consent was obtained.                        - Patient identification and proposed procedure were                         verified prior to the procedure by the physician, the                         nurse, the anesthesiologist, the anesthetist and the                         technician. The procedure was verified in the                         procedure room.  After obtaining informed consent, the colonoscope was                         passed under direct vision. Throughout the procedure,                         the patient's blood pressure, pulse, and oxygen                         saturations were monitored continuously. The                         Colonoscope was introduced  through the anus with the                         intention of advancing to the cecum. The scope was                         advanced to the ascending colon before the procedure                         was aborted. Medications were given. The colonoscopy                         was performed with ease. The patient tolerated the                         procedure well. The quality of the bowel preparation                         was poor. Findings:      The perianal and digital rectal examinations were normal.      Two sessile polyps were found in the transverse colon. The polyps were 8       to 10 mm in size. These polyps were removed with a hot snare. Resection       and retrieval were complete.      A large amount of liquid semi-liquid semi-solid solid stool was found in       the entire colon, interfering with visualization. Due to the poor prep       and thick stool that could not be suctioned well, the cecum could not be       intubated or visualized      Non-bleeding internal hemorrhoids were found during retroflexion. Impression:            - Preparation of the colon was poor.                        - Two 8 to 10 mm polyps in the transverse colon,                         removed with a hot snare. Resected and retrieved.                        - Stool in the entire examined colon.                        - Non-bleeding internal hemorrhoids. Recommendation:        -  Discharge patient to home (with escort).                        - Advance diet as tolerated.                        - Continue present medications.                        - Await pathology results.                        - Repeat colonoscopy at the next available                         appointment, with 2 day prep.                        - The findings and recommendations were discussed with                         the patient.                        - The findings and recommendations were discussed with                          the patient's family.                        - Return to primary care physician as previously                         scheduled. Procedure Code(s):     --- Professional ---                        [phone], 52, Colonoscopy, flexible; with removal of                         tumor(s), polyp(s), or other lesion(s) by snare                         technique Diagnosis Code(s):     --- Professional ---                        K64.8, Other hemorrhoids                        K63.5, Polyp of colon                        D50.9, Iron deficiency anemia, unspecified CPT copyright 2019 American Medical Association. All rights reserved. The codes documented in this report are preliminary and upon coder review may  be revised to meet current compliance requirements.  [name], MD Margretta Sidle B. Bonna Gains MD, MD 07/02/2019 12:13:43 PM This report has been signed electronically. Number of Addenda: 0 Note Initiated On: 07/02/2019 11:09 AM Scope Withdrawal Time: 0 hours 7 minutes 1 second  Total Procedure Duration: 0 hours 26 minutes 46 seconds  Estimated Blood Loss:  Estimated blood loss: none.      [facility]

## 2019-07-02 NOTE — Anesthesia Preprocedure Evaluation (Addendum)
Anesthesia Evaluation  Patient identified by MRN, date of birth, ID band Patient awake    Reviewed: Allergy & Precautions, H&P , NPO status , reviewed documented beta blocker date and time   Airway Mallampati: III  TM Distance: >3 FB Neck ROM: full    Dental  (+) Upper Dentures, Partial Lower   Pulmonary    Pulmonary exam normal        Cardiovascular hypertension, Normal cardiovascular exam+ dysrhythmias   NSR on EKG    Neuro/Psych    GI/Hepatic GERD  ,  Endo/Other  diabetes  Renal/GU Renal disease     Musculoskeletal  (+) Arthritis ,   Abdominal   Peds  Hematology  (+) Blood dyscrasia, anemia ,   Anesthesia Other Findings Past Medical History: 2013: Abnormal mammogram, unspecified No date: Anemia No date: Arthritis 2013: Breast screening, unspecified 1998: Diabetes mellitus without complication ([place])     Comment:  non insulin dependent No date: Dysrhythmia     Comment:  IRREGULAR HEART BEAT No date: GERD (gastroesophageal reflux disease) 2003: Glaucoma No date: Gout No date: History of kidney stones     Comment:  H/O 2008: Hyperlipidemia 1980's: Hypertension 01/03/2012: Lump or mass in breast     Comment:  left breast 2013: Obesity, unspecified No date: Osteoporosis 2013: Shingles 2013: Special screening for malignant neoplasms, colon  Past Surgical History: 1958: ABDOMINAL HYSTERECTOMY     Comment:  menorrhagia No date: BACK SURGERY 01/25/2012: BREAST BIOPSY; Left 04/08/2019: BREAST BIOPSY; Right     Comment:  hear clip, [place] bx, path pending 05/12/2019: BREAST LUMPECTOMY WITH NEEDLE LOCALIZATION; Right     Comment:  Procedure: BREAST LUMPECTOMY WITH NEEDLE LOCALIZATION;                Surgeon: Olean Ree, MD;  Location: [facility] ORS;                Service: General;  Laterality: Right; No date: CATARACT EXTRACTION, BILATERAL; Bilateral     Comment:  1st 06/07/15 and the 2nd 06/21/15 No date:  CHOLECYSTECTOMY 2003: COLONOSCOPY     Comment:  [facility] 2009,2011,2012: KNEE SURGERY     Comment:  twice on right and once on left No date: [place]: LIPOMA EXCISION 2004: SPINE SURGERY No date: TONSILLECTOMY     Comment:  age of 43     Reproductive/Obstetrics                            Anesthesia Physical Anesthesia Plan  ASA: III  Anesthesia Plan: General   Post-op Pain Management:    Induction: Intravenous  PONV Risk Score and Plan: Treatment may vary due to age or medical condition and TIVA  Airway Management Planned: Nasal Cannula and Natural Airway  Additional Equipment:   Intra-op Plan:   Post-operative Plan:   Informed Consent: I have reviewed the patients History and Physical, chart, labs and discussed the procedure including the risks, benefits and alternatives for the proposed anesthesia with the patient or authorized representative who has indicated his/her understanding and acceptance.     Dental Advisory Given  Plan Discussed with: CRNA  Anesthesia Plan Comments:         Anesthesia Quick Evaluation

## 2019-07-02 NOTE — Transfer of Care (Signed)
Immediate Anesthesia Transfer of Care Note  Patient: Taylor [name]  Procedure(s) Performed: COLONOSCOPY WITH PROPOFOL (N/A ) ESOPHAGOGASTRODUODENOSCOPY (EGD) WITH PROPOFOL (N/A )  Patient Location: PACU and Endoscopy Unit  Anesthesia Type:General  Level of Consciousness: sedated  Airway & Oxygen Therapy: Patient Spontanous Breathing  Post-op Assessment: Report given to RN  Post vital signs: stable  Last Vitals:  Vitals Value Taken Time  BP    Temp    Pulse    Resp    SpO2      Last Pain:  Vitals:   07/02/19 1031  TempSrc: Tympanic  PainSc: 0-No pain         Complications: No apparent anesthesia complications

## 2019-07-02 NOTE — H&P (Signed)
Taylor Antigua, MD [address], [place], [place], [place], [address] [address], [place], [place], [place], [address] Phone: [phone]  Fax: [phone]  Primary Care Physician:  [name], MD   Pre-Procedure History & Physical: HPI:  Taylor [name] is a 86 y.o. female is here for a colonoscopy and EGD.   Past Medical History:  Diagnosis Date  . Abnormal mammogram, unspecified 2013  . Anemia   . Arthritis   . Breast screening, unspecified 2013  . Diabetes mellitus without complication ([place]) [id number]   non insulin dependent  . Dysrhythmia    IRREGULAR HEART BEAT  . GERD (gastroesophageal reflux disease)   . Glaucoma 2003  . Gout   . History of kidney stones    H/O  . Hyperlipidemia 2008  . Hypertension 1980's  . Lump or mass in breast 01/03/2012   left breast  . Obesity, unspecified 2013  . Osteoporosis   . Shingles 2013  . Special screening for malignant neoplasms, colon 2013    Past Surgical History:  Procedure Laterality Date  . ABDOMINAL HYSTERECTOMY  1958   menorrhagia  . BACK SURGERY    . BREAST BIOPSY Left 01/25/2012  . BREAST BIOPSY Right 04/08/2019   hear clip, [place] bx, path pending  . BREAST LUMPECTOMY WITH NEEDLE LOCALIZATION Right 05/12/2019   Procedure: BREAST LUMPECTOMY WITH NEEDLE LOCALIZATION;  Surgeon: Olean Ree, MD;  Location: [facility] ORS;  Service: General;  Laterality: Right;  . CATARACT EXTRACTION, BILATERAL Bilateral    1st 06/07/15 and the 2nd 06/21/15  . CHOLECYSTECTOMY    . COLONOSCOPY  2003   [facility]  . KNEE SURGERY  2009,2011,2012   twice on right and once on left  . KNEE SURGERY    . LIPOMA EXCISION  1998  . [place]  2004  . TONSILLECTOMY     age of 16    Prior to Admission medications   Medication Sig Start Date End Date Taking? Authorizing Provider  acetaminophen (TYLENOL) 500 MG tablet Take 1 tablet (500 mg total) by mouth 3 (three) times daily. Patient taking differently: Take 500 mg by mouth every 6 (six)  hours as needed for moderate pain or headache.  12/12/15   [name], MD  amLODipine (NORVASC) 10 MG tablet TAKE 1 TABLET BY MOUTH EVERY DAY Patient taking differently: Take 10 mg by mouth every morning.  02/01/19   [name], MD  AZOPT 1 % ophthalmic suspension Place 1 drop into both eyes in the morning and at bedtime.  06/02/18   [provider]  bimatoprost (LUMIGAN) 0.01 % SOLN Place 1 drop into both eyes at bedtime.     [provider]  brimonidine-timolol (COMBIGAN) 0.2-0.5 % ophthalmic solution Place 1 drop into both eyes 2 (two) times daily.     [provider]  Calcium Carbonate-Vitamin D (OYSTER SHELL CALCIUM 500 + D PO) Take 1 tablet by mouth 2 (two) times a day.    [provider]  denosumab (PROLIA) 60 MG/ML SOSY injection Inject into the skin. 05/25/19   [provider]  famotidine (PEPCID) 20 MG tablet TAKE 1 TABLET BY MOUTH EVERY DAY Patient taking differently: Take 20 mg by mouth every morning.  02/02/19   [name], MD  ferrous sulfate 325 (65 FE) MG tablet TAKE 1 TABLET BY MOUTH EVERY DAY WITH BREAKFAST 05/08/19   Taylor [name], Taylor Stager, MD  fluticasone (FLONASE) 50 MCG/ACT nasal spray SPRAY 2 SPRAYS INTO EACH NOSTRIL EVERY DAY Patient taking differently:  Place 1 spray into both nostrils daily as needed for allergies.  05/19/17   [name], MD  ibuprofen (ADVIL) 600 MG tablet Take 1 tablet (600 mg total) by mouth every 8 (eight) hours as needed for mild pain or moderate pain. 05/12/19   Olean Ree, MD  metFORMIN (GLUCOPHAGE-XR) 750 MG 24 hr tablet Take 1 tablet (750 mg total) by mouth daily with breakfast. 04/17/19   [name], MD  olmesartan (BENICAR) 20 MG tablet Take 1 tablet (20 mg total) by mouth daily. Patient taking differently: Take 20 mg by mouth every morning.  04/17/19   [name], MD  omega-3 acid ethyl esters (LOVAZA) 1 g capsule TAKE 1 CAPSULE (1 G TOTAL) BY MOUTH 2 (TWO) TIMES DAILY. 05/08/19    [name], MD  OneTouch Delica Lancets [id number] MISC USE AS DIRECTED 05/14/19   [name], MD  [facility] ULTRA test strip USE AS DIRECTED 04/14/19   [name], MD  oxyCODONE (OXY IR/ROXICODONE) 5 MG immediate release tablet Take 1 tablet (5 mg total) by mouth every 4 (four) hours as needed for severe pain. 05/12/19   Olean Ree, MD  oxyCODONE-acetaminophen (PERCOCET) 7.5-325 MG tablet Take 1 tablet by mouth every 8 (eight) hours as needed for moderate pain or severe pain. 06/02/19 07/02/19  Taylor Barrows, MD  oxyCODONE-acetaminophen (PERCOCET) 7.5-325 MG tablet Take 1 tablet by mouth every 8 (eight) hours as needed for moderate pain or severe pain. 07/02/19 08/01/19  Taylor Barrows, MD  pravastatin (PRAVACHOL) 40 MG tablet Take 1 tablet (40 mg total) by mouth daily. Patient taking differently: Take 40 mg by mouth at bedtime.  04/17/19   [name], MD  Vitamin D, Ergocalciferol, (DRISDOL) 1.25 MG (50000 UNIT) CAPS capsule Take 50,000 Units by mouth every Wednesday. 04/17/19   [provider]    Allergies as of 06/08/2019 - Review Complete 06/03/2019  Allergen Reaction Noted  . Ace inhibitors Cough 04/03/2017    Family History  Problem Relation Age of Onset  . Cancer Mother        skin cancer  . Stroke Mother   . Heart disease Father   . Breast cancer Neg Hx     Social History   Socioeconomic History  . Marital status: Divorced    Spouse name: Not on file  . Number of children: 4  . Years of education: Not on file  . Highest education level: Some college, no degree  Occupational History  . 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 Smoking status: Never Smoker  . Smokeless tobacco: Never Used  Substance and Sexual Activity  . Alcohol use: No    Alcohol/week: 0.0 standard drinks  . Drug use: No  . Sexual activity: Not Currently  Other Topics Concern  . Not on file  Social History Narrative   Lives alone    Children do not live in town   Friend helps out: Taylor [name] - [phone]   Independent prior to her fall 10/2016   Social Determinants of Health   Financial Resource Strain: Low Risk   . Difficulty of Paying Living Expenses: Not very hard  Food Insecurity: No Food Insecurity  . Worried About Charity fundraiser in the Last Year: Never true  . Ran Out of Food in the Last Year: Never true  Transportation Needs: No Transportation Needs  . Lack of Transportation (Medical): No  . Lack of Transportation (Non-Medical):  No  Physical Activity: Inactive  . Days of Exercise per Week: 0 days  . Minutes of Exercise per Session: 0 min  Stress: No Stress Concern Present  . Feeling of Stress : Not at all  Social Connections: Slightly Isolated  . Frequency of Communication with Friends and Family: More than three times a week  . Frequency of Social Gatherings with Friends and Family: Twice a week  . Attends Religious Services: More than 4 times per year  . Active Member of Clubs or Organizations: Yes  . Attends Archivist Meetings: More than 4 times per year  . Marital Status: Divorced  Human resources officer Violence: Not At Risk  . Fear of Current or Ex-Partner: No  . Emotionally Abused: No  . Physically Abused: No  . Sexually Abused: No    Review of Systems: See HPI, otherwise negative ROS  Physical Exam: There were no vitals taken for this visit. General:   Alert,  pleasant and cooperative in NAD Head:  Normocephalic and atraumatic. Neck:  Supple; no masses or thyromegaly. Lungs:  Clear throughout to auscultation, normal respiratory effort.    Heart:  +S1, +S2, Regular rate and rhythm, No edema. Abdomen:  Soft, nontender and nondistended. Normal bowel sounds, without guarding, and without rebound.   Neurologic:  Alert and  oriented x4;  grossly normal neurologically.  Impression/Plan: MYAH HATALA is here for a colonoscopy and EGD for Iron def anemia  Risks,  benefits, limitations, and alternatives regarding the procedures have been reviewed with the patient.  Questions have been answered.  All parties agreeable.   Virgel Manifold, MD  07/02/2019, 10:15 AM

## 2019-07-03 ENCOUNTER — Encounter: Payer: Self-pay | Admitting: *Deleted

## 2019-07-03 ENCOUNTER — Other Ambulatory Visit: Payer: Self-pay

## 2019-07-03 ENCOUNTER — Telehealth: Payer: Self-pay

## 2019-07-03 DIAGNOSIS — D509 Iron deficiency anemia, unspecified: Secondary | ICD-10-CM

## 2019-07-03 DIAGNOSIS — R634 Abnormal weight loss: Secondary | ICD-10-CM

## 2019-07-03 LAB — SURGICAL PATHOLOGY

## 2019-07-03 NOTE — Telephone Encounter (Signed)
error 

## 2019-07-03 NOTE — Telephone Encounter (Signed)
-----   Message from Virgel Manifold, MD sent at 07/03/2019  9:01 AM EDT ----- Herb Grays please let the patient know, the polyp from her colon were precancerous and were removed. Due to the poor prep I recommend a repeat colonoscopy with a 2 day prep to rule out any other polyps or cancer. I am not sure why she has an appointment with Dr. Marius Ditch in June, that can be switched to me. Also, can you please call the Endo unit and tell them her procedure report is not showing up in Epic and that needs to be fixed.

## 2019-07-03 NOTE — Telephone Encounter (Signed)
Patient was called to let her know about her colonoscopy results and recommendations. Patient agreed to reschedule her colonoscopy and was informed about the 2 day prep and that she would need to do another COVID-19 testing and her follow up appointment with Dr. Bonna Gains on 08/12/2019. Patient had no further questions. Her colonoscopy 2 day prep and follow up appointment will be mailed.

## 2019-07-03 NOTE — Anesthesia Postprocedure Evaluation (Signed)
Anesthesia Post Note  Patient: Taylor [name]  Procedure(s) Performed: COLONOSCOPY WITH PROPOFOL (N/A ) ESOPHAGOGASTRODUODENOSCOPY (EGD) WITH PROPOFOL (N/A )  Patient location during evaluation: Endoscopy Anesthesia Type: General Level of consciousness: awake and alert Pain management: pain level controlled Vital Signs Assessment: post-procedure vital signs reviewed and stable Respiratory status: spontaneous breathing, nonlabored ventilation and respiratory function stable Cardiovascular status: blood pressure returned to baseline and stable Postop Assessment: no apparent nausea or vomiting Anesthetic complications: no     Last Vitals:  Vitals:   07/02/19 1241 07/02/19 1251  BP: (!) 148/81 (!) 154/78  Pulse: 71 73  Resp: 17 (!) 25  Temp:    SpO2: 100% 100%    Last Pain:  Vitals:   07/02/19 1251  TempSrc:   PainSc: 0-No pain                 Alphonsus Sias

## 2019-07-07 ENCOUNTER — Ambulatory Visit: Payer: Medicare Other | Admitting: Physical Therapy

## 2019-07-07 ENCOUNTER — Other Ambulatory Visit: Payer: Self-pay

## 2019-07-07 ENCOUNTER — Encounter: Payer: Self-pay | Admitting: Physical Therapy

## 2019-07-07 DIAGNOSIS — R262 Difficulty in walking, not elsewhere classified: Secondary | ICD-10-CM | POA: Diagnosis not present

## 2019-07-07 DIAGNOSIS — G8929 Other chronic pain: Secondary | ICD-10-CM

## 2019-07-07 DIAGNOSIS — M545 Low back pain: Secondary | ICD-10-CM | POA: Diagnosis not present

## 2019-07-07 DIAGNOSIS — R296 Repeated falls: Secondary | ICD-10-CM

## 2019-07-07 NOTE — Telephone Encounter (Signed)
No I haven't seen any orders.

## 2019-07-07 NOTE — Therapy (Signed)
[facility] [address], [place], [address] Phone: [phone]   Fax:  [phone]  Physical Therapy Treatment  Patient Details  Name: Taylor [name] MRN: [id number] Date of Birth: 06/29/1932 No data recorded  Encounter Date: 07/07/2019  PT End of Session - 07/07/19 1449    Visit Number  30    Number of Visits  69    Date for PT Re-Evaluation  08/24/19    Authorization - Visit Number  9    Authorization - Number of Visits  10    PT Start Time  0243    PT Stop Time  0315    PT Time Calculation (min)  32 min    Equipment Utilized During Treatment  Gait belt    Activity Tolerance  Patient tolerated treatment well    Behavior During Therapy  [facility] for tasks assessed/performed       Past Medical History:  Diagnosis Date  . Abnormal mammogram, unspecified 2013  . Anemia   . Arthritis   . Breast screening, unspecified 2013  . Diabetes mellitus without complication ([place]) [id number]   non insulin dependent  . Dysrhythmia    IRREGULAR HEART BEAT  . GERD (gastroesophageal reflux disease)   . Glaucoma 2003  . Gout   . History of kidney stones    H/O  . Hyperlipidemia 2008  . Hypertension 1980's  . Lump or mass in breast 01/03/2012   left breast  . Obesity, unspecified 2013  . Osteoporosis   . Shingles 2013  . Special screening for malignant neoplasms, colon 2013    Past Surgical History:  Procedure Laterality Date  . ABDOMINAL HYSTERECTOMY  1958   menorrhagia  . BACK SURGERY    . BREAST BIOPSY Left 01/25/2012  . BREAST BIOPSY Right 04/08/2019   hear clip, [place] bx, path pending  . BREAST LUMPECTOMY WITH NEEDLE LOCALIZATION Right 05/12/2019   Procedure: BREAST LUMPECTOMY WITH NEEDLE LOCALIZATION;  Surgeon: Olean Ree, MD;  Location: [facility] ORS;  Service: General;  Laterality: Right;  . CATARACT EXTRACTION, BILATERAL Bilateral    1st 06/07/15 and the 2nd 06/21/15  . CHOLECYSTECTOMY    . COLONOSCOPY  2003    [facility]  . COLONOSCOPY WITH PROPOFOL N/A 07/02/2019   Procedure: COLONOSCOPY WITH PROPOFOL;  Surgeon: Virgel Manifold, MD;  Location: [facility] ENDOSCOPY;  Service: Endoscopy;  Laterality: N/A;  . ESOPHAGOGASTRODUODENOSCOPY (EGD) WITH PROPOFOL N/A 07/02/2019   Procedure: ESOPHAGOGASTRODUODENOSCOPY (EGD) WITH PROPOFOL;  Surgeon: Virgel Manifold, MD;  Location: [facility] ENDOSCOPY;  Service: Endoscopy;  Laterality: N/A;  . KNEE SURGERY  2009,2011,2012   twice on right and once on left  . KNEE SURGERY    . LIPOMA EXCISION  1998  . [place]  2004  . TONSILLECTOMY     age of 69    There were no vitals filed for this visit.  Subjective Assessment - 07/07/19 1447    Subjective  Reports she was having a lot of back pain that is decreased today. Reports she did what exercises she could do at home, which wasn't much. Reports she had a colonoscopy, but that she has to have another done.    Pertinent History  Patinet is a 86 year old female presenting to clinic for LBP. Patient has LBP that bothers her when she stands up for 10-24mins for ADLS. Back pain does not radiate and feels achy. She can only sit without back support for 56mins without pain. Worst  pain in the past week 9/10 best: 5/10. Reports she has had about 3 falls in the past year, and is usually falling backwards. Reports she is very unsteady with stepping onto varied surfaces. Ambulates with a hurry cane in her LUE and uses her RUE to furiture walk, and has a RW that she will use when she has someone to put it in her car with her when she goes out. When she gets to a store, she uses cart to help her walk. She lives alone and completes her own cooking, cleaning, but has trouble standing for the time it takes to cook/do dishing. Patient reports she gives herself bird baths in her bathroom because she cannot step over her tub to get into her tub/shower that has a seat in it. She drives and completes her own errands. When her church was open she  enjoyed attending, enjoys playing games on her Ipad, and has friends in the community that come and check on her daily, as well as being apart of the "Are You Okay" program with the [place] police (they call daily and if you do not answer they will send the police there to check on you). No steps to enter 1 story home, has a ramp. Denies sensation deficits.  Pt denies N/V, B&B changes, unexplained weight fluctuation, saddle paresthesia, fever, night sweats, or unrelenting night pain at this time.    Limitations  Sitting;Lifting;Standing;Walking    How long can you sit comfortably?  without support 10-41mins    How long can you stand comfortably?  10-99mins    How long can you walk comfortably?  with cane 66mins    Patient Stated Goals  Walk better    Pain Onset  More than a month ago         [place] setting 6 no UEsL3keeping spm above64for5mins; with patient able to maintain SPM with encouragement - Standing alt taps onto 3in step 3x 12 with cuing to prevent posterior lean/LOB, and to not "drag foot" off step, but to lift, unilateral UE support; standing rest between  - STS from elevated (nustep seat) without UE2x 10 with cuing for "quick" stand and controlled lower with good carry over following - Standing hip abd 2x 10 bilat with BUE support and min cuing for posture with good carry over; standing rests between      Gait Training Inside through clinic and outside down decline >5% grade to parking lot with curb step down. PT mostly supervision, but occasional CGA for safety                      PT Education - 07/07/19 1448    Education Details  therex form, gait training    Person(s) Educated  Patient    Methods  Explanation;Demonstration;Verbal cues    Comprehension  Verbalized understanding;Returned demonstration;Verbal cues required       PT Short Term Goals - 06/01/19 1651      PT SHORT TERM GOAL #1   Title  Pt will be independent with HEP in  order to improve strength and decrease back pain in order to improve pain-free function at home and increase QOL    Baseline  06/01/19 HEP reprinted d/t non compliance following surgery 04/23/19 75% compliance subjectively reported from patient 03/03/19 Completing HEP with 50% compliance    Time  4    Period  Weeks    Status  Revised      PT SHORT TERM GOAL #2  Title  Pt will improve BERG by at least 3 points in order to demonstrate clinically significant improvement in balance.    Baseline  06/01/19 28/56    Time  5    Period  Weeks    Status  Revised      PT SHORT TERM GOAL #3   Title  Pt will increase 10MWT by at least 0.13 m/s in order to demonstrate clinically significant improvement in community ambulation.    Baseline  06/01/19 0.24m/s    Time  5    Period  Weeks    Status  Revised      PT SHORT TERM GOAL #4   Title  Pt will be able to complete 5TSTS  in order to demonstrate clinically significant improvement in LE strength    Baseline  06/01/19 unable    Time  6    Period  Weeks    Status  Revised        PT Long Term Goals - 06/01/19 1633      PT LONG TERM GOAL #1   Title  Patient will obtain a score of 46/56 on the BERG to demonstrate decreased fall risk    Baseline  06/01/19 28/56 04/23/19 33/56 03/02/54 31/56    Time  12    Period  Weeks    Status  Revised      PT LONG TERM GOAL #2   Title  Patient will demonstrate walk speed of 0.8-1.80m/s on 10MWT in order to demonstrate household ambulator walk speed with safety    Baseline  06/01/19 0.23m/s with supervision and [facility] 04/23/19 0.32m/s with CGA and [facility] 12/30/18 0.52m/s with HHA with SPC, needing min-modA to  Prevent LOB on 4 occasions    Time  12    Period  Weeks    Status  Revised      PT LONG TERM GOAL #3   Title  Patient will complete 5xSTS without UE support in 14.8sec in order to demonstrate age matched strength norms    Baseline  06/01/19 unable to complete 04/23/19 20sec 03/03/19 36sec    Time  12    Period  Weeks     Status  Revised      PT LONG TERM GOAL #4   Title  Patient will be able to ambulate 210ft with SPC without LOB/need for external support, and without instances of LOB, in order to demonstrate safety walking household distances/into story    Baseline  06/01/19 198ft with SPC, RUE high gaurd, supervision with instances of CGA needed to maintain balance 04/23/19 165ft with SPC and CGA with high gaurd with RUE, 1 instance of incresaed time needed to re-establish balance.    Time  12    Period  Weeks    Status  Revised            Plan - 07/07/19 1522    Clinical Impression Statement  PT continued therex progression for increased BLE strength with success. Session shortened d/t pt arriving late. Patient is able to comply with cuing for proper muscle activation, technique, and safety with good success. Patient is motivated throughout session with no increased pain. PT will continue progression as able.    Personal Factors and Comorbidities  Age;Fitness;Comorbidity 1;Comorbidity 2;Comorbidity 3+;Past/Current Experience;Sex    Comorbidities  HTN, HLD, GERD, osteoporosis    Examination-Activity Limitations  Bathing;Sit;Dressing;Transfers;Bed Mobility;Bend;Lift;Carry;Reach Overhead;Stand;Stairs    Stability/Clinical Decision Making  Evolving/Moderate complexity    Clinical Decision Making  Moderate  Rehab Potential  Good    PT Frequency  2x / week    PT Duration  8 weeks    PT Treatment/Interventions  Moist Heat;Traction;Gait training;Stair training;Balance training;Dry needling;Joint Manipulations;Spinal Manipulations;Passive range of motion;Manual techniques;Patient/family education;Therapeutic exercise;Electrical Stimulation;ADLs/Self Care Home Management;Therapeutic activities;Functional mobility training;Energy conservation;Neuromuscular re-education    PT Next Visit Plan  Balance training, LE/core strengthening    PT Home Exercise Plan  STS from chair, seated hip abd, seated marching     Consulted and Agree with Plan of Care  Patient       Patient will benefit from skilled therapeutic intervention in order to improve the following deficits and impairments:  Abnormal gait, Decreased balance, Decreased endurance, Decreased mobility, Difficulty walking, Hypomobility, Cardiopulmonary status limiting activity, Decreased range of motion, Improper body mechanics, Decreased activity tolerance, Decreased coordination, Decreased safety awareness, Decreased strength, Increased fascial restricitons, Impaired flexibility, Postural dysfunction, Pain  Visit Diagnosis: Difficulty in walking, not elsewhere classified  Chronic midline low back pain without sciatica  Repeated falls     Problem List Patient Active Problem List   Diagnosis Date Noted  . Gastric polyp   . Polyp of colon   . Morbid obesity ([place]) 04/08/2018  . Age-related osteoporosis without current pathological fracture 09/26/2017  . Pain in both hands 09/23/2017  . Hyperparathyroidism ([place]) 08/06/2017  . Chronic kidney disease, stage III (moderate) 08/06/2017  . HLD (hyperlipidemia) 07/25/2017  . Osteoporotic compression fracture of spine ([place]) 03/22/2017  . Assistance needed with transportation 03/21/2017  . Iron deficiency anemia 12/19/2016  . Aortic atherosclerosis ([place]) 11/23/2016  . Anterolisthesis 11/23/2016  . Chronic pain syndrome 09/13/2016  . Vitamin D deficiency 08/19/2016  . Anemia, unspecified 08/19/2016  . Primary open angle glaucoma (POAG) of both eyes, severe stage 08/17/2016  . Hypertensive retinopathy 08/17/2016  . Long term current use of opiate analgesic 07/19/2016  . Dyslipidemia associated with type 2 diabetes mellitus ([place]) 04/16/2016  . Chronic midline low back pain without sciatica 04/16/2016  . Chronic pain of right knee 04/16/2016  . Vaginal dryness 02/22/2015  . Calculus of kidney 11/10/2014  . Osteoarthritis, multiple sites 09/09/2014  . Type 2 diabetes mellitus with  microalbuminuria ([place]) 09/09/2014  . Glaucoma 09/09/2014  . Benign hypertension with CKD (chronic kidney disease) stage III ([place]) 09/09/2014  . Chronic pain 09/09/2014  . Tinea corporis 09/09/2014  . Umbilical hernia [id number]  . Mass of right breast    Shelton Silvas PT, DPT Shelton Silvas 07/07/2019, 3:46 PM  [facility] [address] [place], [address] Phone: [phone]   Fax:  [phone]  Name: Taylor [name] MRN: [id number] Date of Birth: Mar 21, 1932

## 2019-07-08 ENCOUNTER — Ambulatory Visit: Payer: Medicare Other | Admitting: Pharmacist

## 2019-07-08 DIAGNOSIS — E1129 Type 2 diabetes mellitus with other diabetic kidney complication: Secondary | ICD-10-CM

## 2019-07-08 DIAGNOSIS — E785 Hyperlipidemia, unspecified: Secondary | ICD-10-CM

## 2019-07-09 ENCOUNTER — Other Ambulatory Visit: Payer: Self-pay

## 2019-07-09 ENCOUNTER — Ambulatory Visit: Payer: Medicare Other | Admitting: Physical Therapy

## 2019-07-09 ENCOUNTER — Encounter: Payer: Self-pay | Admitting: Physical Therapy

## 2019-07-09 ENCOUNTER — Other Ambulatory Visit: Payer: Self-pay | Admitting: Family Medicine

## 2019-07-09 DIAGNOSIS — R296 Repeated falls: Secondary | ICD-10-CM | POA: Diagnosis not present

## 2019-07-09 DIAGNOSIS — R262 Difficulty in walking, not elsewhere classified: Secondary | ICD-10-CM | POA: Diagnosis not present

## 2019-07-09 DIAGNOSIS — M545 Low back pain, unspecified: Secondary | ICD-10-CM

## 2019-07-09 DIAGNOSIS — J3089 Other allergic rhinitis: Secondary | ICD-10-CM

## 2019-07-09 DIAGNOSIS — G8929 Other chronic pain: Secondary | ICD-10-CM

## 2019-07-09 MED ORDER — FLUTICASONE PROPIONATE 50 MCG/ACT NA SUSP: NASAL | 1 refills | Status: DC

## 2019-07-09 NOTE — Therapy (Signed)
[facility] [address], [place], [address] Phone: [phone]   Fax:  [phone]  Physical Therapy Treatment/Progress Note Reporting Period 06/01/19 - 07/09/19   Patient Details  Name: Taylor [name] MRN: [id number] Date of Birth: [birth year] No data recorded  Encounter Date: 07/09/2019  PT End of Session - 07/09/19 1448    Visit Number  31    Number of Visits  76    Date for PT Re-Evaluation  08/24/19    Authorization - Visit Number  10    Authorization - Number of Visits  10    PT Start Time  0242    PT Stop Time  0313    PT Time Calculation (min)  31 min    Equipment Utilized During Treatment  Gait belt    Activity Tolerance  Patient tolerated treatment well    Behavior During Therapy  [facility] for tasks assessed/performed       Past Medical History:  Diagnosis Date  . Abnormal mammogram, unspecified 2013  . Anemia   . Arthritis   . Breast screening, unspecified 2013  . Diabetes mellitus without complication ([place]) [id number]   non insulin dependent  . Dysrhythmia    IRREGULAR HEART BEAT  . GERD (gastroesophageal reflux disease)   . Glaucoma 2003  . Gout   . History of kidney stones    H/O  . Hyperlipidemia 2008  . Hypertension 1980's  . Lump or mass in breast 01/03/2012   left breast  . Obesity, unspecified 2013  . Osteoporosis   . Shingles 2013  . Special screening for malignant neoplasms, colon 2013    Past Surgical History:  Procedure Laterality Date  . ABDOMINAL HYSTERECTOMY  1958   menorrhagia  . BACK SURGERY    . BREAST BIOPSY Left 01/25/2012  . BREAST BIOPSY Right 04/08/2019   hear clip, [place] bx, path pending  . BREAST LUMPECTOMY WITH NEEDLE LOCALIZATION Right 05/12/2019   Procedure: BREAST LUMPECTOMY WITH NEEDLE LOCALIZATION;  Surgeon: Olean Ree, MD;  Location: [facility] ORS;  Service: General;  Laterality: Right;  . CATARACT EXTRACTION, BILATERAL Bilateral    1st 06/07/15 and the 2nd  06/21/15  . CHOLECYSTECTOMY    . COLONOSCOPY  2003   [facility]  . COLONOSCOPY WITH PROPOFOL N/A 07/02/2019   Procedure: COLONOSCOPY WITH PROPOFOL;  Surgeon: Virgel Manifold, MD;  Location: [facility] ENDOSCOPY;  Service: Endoscopy;  Laterality: N/A;  . ESOPHAGOGASTRODUODENOSCOPY (EGD) WITH PROPOFOL N/A 07/02/2019   Procedure: ESOPHAGOGASTRODUODENOSCOPY (EGD) WITH PROPOFOL;  Surgeon: Virgel Manifold, MD;  Location: [facility] ENDOSCOPY;  Service: Endoscopy;  Laterality: N/A;  . KNEE SURGERY  2009,2011,2012   twice on right and once on left  . KNEE SURGERY    . LIPOMA EXCISION  1998  . [place]  2004  . TONSILLECTOMY     age of 86    There were no vitals filed for this visit.  Subjective Assessment - 07/09/19 1447    Subjective  Reports "usual pain today", min compliance with HEP. Reports she is feeling better from her procedure. Arrives late    Pertinent History  Patinet is a 86 year old female presenting to clinic for LBP. Patient has LBP that bothers her when she stands up for 10-28mins for ADLS. Back pain does not radiate and feels achy. She can only sit without back support for 68mins without pain. Worst pain in the past week 9/10 best: 5/10. Reports she has had about 3  falls in the past year, and is usually falling backwards. Reports she is very unsteady with stepping onto varied surfaces. Ambulates with a hurry cane in her LUE and uses her RUE to furiture walk, and has a RW that she will use when she has someone to put it in her car with her when she goes out. When she gets to a store, she uses cart to help her walk. She lives alone and completes her own cooking, cleaning, but has trouble standing for the time it takes to cook/do dishing. Patient reports she gives herself bird baths in her bathroom because she cannot step over her tub to get into her tub/shower that has a seat in it. She drives and completes her own errands. When her church was open she enjoyed attending, enjoys playing games on  her Ipad, and has friends in the community that come and check on her daily, as well as being apart of the "Are You Okay" program with the [place] police (they call daily and if you do not answer they will send the police there to check on you). No steps to enter 1 story home, has a ramp. Denies sensation deficits.  Pt denies N/V, B&B changes, unexplained weight fluctuation, saddle paresthesia, fever, night sweats, or unrelenting night pain at this time.    Limitations  Sitting;Lifting;Standing;Walking    How long can you sit comfortably?  without support 10-76mins    How long can you stand comfortably?  10-23mins    How long can you walk comfortably?  with cane 30mins    Patient Stated Goals  Walk better         THEREX -Nustepseat setting 6 no UEsL3keeping spm above41for5mins;with patient able to maintain SPM with encouragement - 5xSTS 34sec - BERG balance assessment with guarding needed for all assessments, with rest breaks as needed, encouragement needed   Gait Training Inside through clinic and outside down decline >5% grade to parking lot with curb step down. PT mostly supervision, butoccasional CGA for safety Ambulation 262ft with CGA needed for safety, cuing for cane management around turns with good carry over, 4x of high guard and increased standing time needed to re-establish balance.                         PT Education - 07/09/19 1447    Education Details  therex form, gait training    Person(s) Educated  Patient    Methods  Explanation;Demonstration;Verbal cues    Comprehension  Verbalized understanding;Returned demonstration;Verbal cues required       PT Short Term Goals - 07/09/19 1451      PT SHORT TERM GOAL #1   Title  Pt will be independent with HEP in order to improve strength and decrease back pain in order to improve pain-free function at home and increase QOL    Baseline  07/09/19 some compliance with HEP 06/01/19 HEP reprinted d/t non  compliance following surgery    Time  4    Period  Weeks    Status  Revised      PT SHORT TERM GOAL #2   Title  Pt will improve BERG by at least 3 points in order to demonstrate clinically significant improvement in balance.    Baseline  07/09/19 29/46 06/01/19 26/56    Time  5    Period  Weeks    Status  On-going      PT SHORT TERM GOAL #3   Title  Pt will increase 10MWT by at least 0.13 m/s in order to demonstrate clinically significant improvement in community ambulation.    Baseline  07/09/19 0.49m/s 06/01/19 0.62m/s    Time  5    Period  Weeks    Status  Achieved      PT SHORT TERM GOAL #4   Title  Pt will be able to complete 5TSTS  in order to demonstrate clinically significant improvement in LE strength    Baseline  07/09/19 able with UE for control 34sec 06/01/19 unable    Time  6    Period  Weeks    Status  Achieved        PT Long Term Goals - 07/09/19 1500      PT LONG TERM GOAL #1   Title  Patient will obtain a score of 46/56 on the BERG to demonstrate decreased fall risk    Baseline  07/09/19 29/46 06/01/19 26/56    Time  12    Period  Weeks    Status  On-going      PT LONG TERM GOAL #2   Title  Patient will demonstrate walk speed of 0.8-1.33m/s on 10MWT in order to demonstrate household ambulator walk speed with safety    Baseline  07/09/19 0.51m/s 06/01/19 0.68m/s with supervision and SPC    Time  12    Period  Weeks    Status  On-going      PT LONG TERM GOAL #3   Title  Patient will complete 5xSTS without UE support in 14.8sec in order to demonstrate age matched strength norms    Baseline  07/09/19 34sec 06/01/19 unable to complete    Time  12    Period  Weeks    Status  On-going      PT LONG TERM GOAL #4   Title  Patient will be able to ambulate 248ft with SPC without LOB/need for external support, and without instances of LOB, in order to demonstrate safety walking household distances/into story    Baseline  07/09/19 240ft with SPC, RUE high gaurd with CGA  needed x4 instances to maintain balance, no furntiture walking 06/01/19 149ft with SPC, RUE high gaurd, supervision with instances of CGA needed to maintain balance    Time  12    Period  Weeks    Status  On-going            Plan - 07/09/19 1716    Clinical Impression Statement  PT re-assessed goals this session for medicare compliance. Patient is making good progress toward goals since setback from hospitalization. Patient is able to complete transfers indpendently, and has improved gait speed from last evaluation, though neither of these assessments are at the level patient was at prior to hospitalization. Patient is able to walk furthest distance to date without needed much external support (other than SPC), though gait speed is very slow. Patient is improving BERG balance score, but is still indicative of fall risk. Patient is motivated to continue to improve these assessments, and is clearly benefiting from therapy, indicated by increase in all functional outcome measures. PT will continue progression as able.    Personal Factors and Comorbidities  Age;Fitness;Comorbidity 1;Comorbidity 2;Comorbidity 3+;Past/Current Experience;Sex    Comorbidities  HTN, HLD, GERD, osteoporosis    Examination-Activity Limitations  Bathing;Sit;Dressing;Transfers;Bed Mobility;Bend;Lift;Carry;Reach Overhead;Stand;Stairs    Examination-Participation Restrictions  Church;Laundry;Cleaning;Community Activity;Meal Prep    Stability/Clinical Decision Making  Evolving/Moderate complexity    Clinical Decision Making  Moderate  Rehab Potential  Good    PT Frequency  2x / week    PT Duration  8 weeks    PT Treatment/Interventions  Moist Heat;Traction;Gait training;Stair training;Balance training;Dry needling;Joint Manipulations;Spinal Manipulations;Passive range of motion;Manual techniques;Patient/family education;Therapeutic exercise;Electrical Stimulation;ADLs/Self Care Home Management;Therapeutic  activities;Functional mobility training;Energy conservation;Neuromuscular re-education    PT Next Visit Plan  Balance training, LE/core strengthening    PT Home Exercise Plan  STS from chair, seated hip abd, seated marching    Consulted and Agree with Plan of Care  Patient       Patient will benefit from skilled therapeutic intervention in order to improve the following deficits and impairments:  Abnormal gait, Decreased balance, Decreased endurance, Decreased mobility, Difficulty walking, Hypomobility, Cardiopulmonary status limiting activity, Decreased range of motion, Improper body mechanics, Decreased activity tolerance, Decreased coordination, Decreased safety awareness, Decreased strength, Increased fascial restricitons, Impaired flexibility, Postural dysfunction, Pain  Visit Diagnosis: Difficulty in walking, not elsewhere classified  Chronic midline low back pain without sciatica  Repeated falls     Problem List Patient Active Problem List   Diagnosis Date Noted  . Gastric polyp   . Polyp of colon   . Morbid obesity ([place]) 04/08/2018  . Age-related osteoporosis without current pathological fracture 09/26/2017  . Pain in both hands 09/23/2017  . Hyperparathyroidism ([place]) 08/06/2017  . Chronic kidney disease, stage III (moderate) 08/06/2017  . HLD (hyperlipidemia) 07/25/2017  . Osteoporotic compression fracture of spine ([place]) 03/22/2017  . Assistance needed with transportation 03/21/2017  . Iron deficiency anemia 12/19/2016  . Aortic atherosclerosis ([place]) 11/23/2016  . Anterolisthesis 11/23/2016  . Chronic pain syndrome 09/13/2016  . Vitamin D deficiency 08/19/2016  . Anemia, unspecified 08/19/2016  . Primary open angle glaucoma (POAG) of both eyes, severe stage 08/17/2016  . Hypertensive retinopathy 08/17/2016  . Long term current use of opiate analgesic 07/19/2016  . Dyslipidemia associated with type 2 diabetes mellitus ([place]) 04/16/2016  . Chronic midline low back pain  without sciatica 04/16/2016  . Chronic pain of right knee 04/16/2016  . Vaginal dryness 02/22/2015  . Calculus of kidney 11/10/2014  . Osteoarthritis, multiple sites 09/09/2014  . Type 2 diabetes mellitus with microalbuminuria ([place]) 09/09/2014  . Glaucoma 09/09/2014  . Benign hypertension with CKD (chronic kidney disease) stage III ([place]) 09/09/2014  . Chronic pain 09/09/2014  . Tinea corporis 09/09/2014  . Umbilical hernia [id number]  . Mass of right breast    Shelton Silvas PT, DPT Shelton Silvas 07/09/2019, 5:21 PM  [facility] [address] [place], [address] Phone: [phone]   Fax:  [phone]  Name: MAKENZEE MATHE MRN: [id number] Date of Birth: Jul 07, 1932

## 2019-07-11 NOTE — Chronic Care Management (AMB) (Signed)
Chronic Care Management Pharmacy  Name: Taylor [name]  MRN: [id number] DOB: [birth year]  Chief Complaint/ HPI  Taylor [name],  86 y.o. , female presents for their Initial CCM visit with the clinical pharmacist via telephone due to COVID-19 Pandemic.  PCP : [name], MD  Their chronic conditions include: HTN, DM, HLD,osteoporosis, hyperparathyroidism  Office Visits: 4/9 Iron def anemia, [place], BP 118/58 P 68 Wt 184 BMI 34.8, OP wrist on Prolia FRAX 41yr 1.7% hip 6.3%, breast fibroadenoma, A1c 6.5%, 22# weight loss now stable  Consult Visit: 3/22 chronic pain, [place], cont stretching and strengthening 3/8 CKD, Singh  Medications: Outpatient Encounter Medications as of 07/08/2019  Medication Sig  . acetaminophen (TYLENOL) 500 MG tablet Take 1 tablet (500 mg total) by mouth 3 (three) times daily. (Patient taking differently: Take 500 mg by mouth every 6 (six) hours as needed for moderate pain or headache. )  . amLODipine (NORVASC) 10 MG tablet TAKE 1 TABLET BY MOUTH EVERY DAY (Patient taking differently: Take 10 mg by mouth every morning. )  . AZOPT 1 % ophthalmic suspension Place 1 drop into both eyes in the morning and at bedtime.   . bimatoprost (LUMIGAN) 0.01 % SOLN Place 1 drop into both eyes at bedtime.   . brimonidine-timolol (COMBIGAN) 0.2-0.5 % ophthalmic solution Place 1 drop into both eyes 2 (two) times daily.   . Calcium Carbonate-Vitamin D (OYSTER SHELL CALCIUM 500 + D PO) Take 1 tablet by mouth 2 (two) times a day.  . denosumab (PROLIA) 60 MG/ML SOSY injection Inject into the skin.  . famotidine (PEPCID) 20 MG tablet TAKE 1 TABLET BY MOUTH EVERY DAY (Patient taking differently: Take 20 mg by mouth every morning. )  . ferrous sulfate 325 (65 FE) MG tablet TAKE 1 TABLET BY MOUTH EVERY DAY WITH BREAKFAST  . fluticasone (FLONASE) 50 MCG/ACT nasal spray SPRAY 2 SPRAYS INTO EACH NOSTRIL EVERY DAY (Patient taking differently: Place 1 spray into both nostrils daily as  needed for allergies. )  . ibuprofen (ADVIL) 600 MG tablet Take 1 tablet (600 mg total) by mouth every 8 (eight) hours as needed for mild pain or moderate pain.  . metFORMIN (GLUCOPHAGE-XR) 750 MG 24 hr tablet Take 1 tablet (750 mg total) by mouth daily with breakfast.  . olmesartan (BENICAR) 20 MG tablet Take 1 tablet (20 mg total) by mouth daily. (Patient taking differently: Take 20 mg by mouth every morning. )  . omega-3 acid ethyl esters (LOVAZA) 1 g capsule TAKE 1 CAPSULE (1 G TOTAL) BY MOUTH 2 (TWO) TIMES DAILY.  [name] Delica Lancets [id number] MISC USE AS DIRECTED  . ONETOUCH ULTRA test strip USE AS DIRECTED  . oxyCODONE (OXY IR/ROXICODONE) 5 MG immediate release tablet Take 1 tablet (5 mg total) by mouth every 4 (four) hours as needed for severe pain.  [name] oxyCODONE-acetaminophen (PERCOCET) 7.5-325 MG tablet Take 1 tablet by mouth every 8 (eight) hours as needed for moderate pain or severe pain.  . pravastatin (PRAVACHOL) 40 MG tablet Take 1 tablet (40 mg total) by mouth daily. (Patient taking differently: Take 40 mg by mouth at bedtime. )  . Vitamin D, Ergocalciferol, (DRISDOL) 1.25 MG (50000 UNIT) CAPS capsule Take 50,000 Units by mouth every Wednesday.   No facility-administered encounter medications on file as of 07/08/2019.      Physical Activity: Inactive  . Days of Exercise per Week: 0 days  . Minutes of Exercise per Session: 0 min   Current Diagnosis/Assessment:  Goals Addressed   None    Hypertension   Office blood pressures are  BP Readings from Last 3 Encounters:  07/02/19 (!) 154/78  05/29/19 (!) 118/58  05/25/19 125/76    Patient has failed these meds in the past: NA  Patient checks BP at home infrequently  Patient home BP readings are ranging: NA  We discussed   Mostly controlled on olmesartan monotherapy  Plan  Patient to get BP machine and check daily Continue current medications     Hyperlipidemia   Lipid Panel     Component Value Date/Time     CHOL 115 12/15/2018 1405   CHOL 127 08/09/2015 0921   TRIG 137 12/15/2018 1405   HDL 47 (L) 12/15/2018 1405   HDL 47 08/09/2015 0921   CHOLHDL 2.4 12/15/2018 1405   VLDL 23 08/14/2016 1012   LDLCALC 46 12/15/2018 1405   LABVLDL 21 08/09/2015 0921     The ASCVD Risk score (Goff DC Jr., et al., 2013) failed to calculate for the following reasons:   The 2013 ASCVD risk score is only valid for ages 72 to 80   Patient has failed these meds in past: NA Patient is currently controlled on the following medications: pravastatin  We discussed:    Potentially more benefit from Lipitor or Crestor. She doesn't remember trying these.  Plan  Continue current medications  Diabetes   Recent Relevant Labs: Lab Results  Component Value Date/Time   HGBA1C 6.5 04/17/2019 02:08 PM   HGBA1C 6.4 (A) 12/15/2018 01:41 PM   HGBA1C 6.6 08/13/2018 02:51 PM   HGBA1C 7.8 (A) 08/05/2017 11:30 AM   MICROALBUR 1.8 04/17/2019 03:12 PM   MICROALBUR 10.4 04/09/2018 02:43 PM   MICROALBUR 50 08/05/2017 11:28 AM   MICROALBUR 100 12/12/2015 12:21 PM     Checking BG: Never  Patient has failed these meds in past: NA Patient is currently controlled on the following medications: metformin  Last diabetic Foot exam:  Lab Results  Component Value Date/Time   HMDIABEYEEXA No Retinopathy 06/03/2017 12:00 AM    Last diabetic Eye exam: No results found for: HMDIABFOOTEX   We discussed:  Metformin decreased to 1 tablet daily  Plan  Continue current medications   Chonic Pain   Patient has failed these meds in past: ibuprofen Patient is currently controlled on the following medications: oxycodone, acetaminophen  We discussed:    Tylenol 1000mg  as needed Oxycodone every 8 hours  Plan Update acetaminophen in medication list  Continue current medications  Osteopenia / Osteoporosis   Last DEXA Scan: 04/30/17   T-Score femoral neck: -3.3  T-Score total hip: -1.9  T-Score lumbar spine:  NA  T-Score forearm radius: -2.3  10-year probability of major osteoporotic fracture: 1.7%  10-year probability of hip fracture: 6.5%  Vit D, 25-Hydroxy  Date Value Ref Range Status  08/05/2017 29 (L) 30 - 100 ng/mL Final    Comment:    Vitamin D Status         25-OH Vitamin D: . Deficiency:                    <20 ng/mL Insufficiency:             20 - 29 ng/mL Optimal:                 > or = 30 ng/mL . For 25-OH Vitamin D testing on patients on  D2-supplementation and patients for whom quantitation  of D2 and D3  fractions is required, the QuestAssureD(TM) 25-OH VIT D, (D2,D3), LC/MS/MS is recommended: order  code [id number] (patients >47yrs). . For more information on this test, go to: http://education.questdiagnostics.com/faq/FAQ163 (This link is being provided for  informational/educational purposes only.)      Patient is a candidate for pharmacologic treatment due to T-Score < -2.5 in femoral neck  Patient has failed these meds in past: Fosamax Patient is currently controlled on the following medications: Prolia, Citracal+D, Vitamin D2  We discussed:  Recommend [phone] units of vitamin D daily. Patient was given Citracal by her retail pharmacist, but she was afraid to take it. Was not taking any calcium. Reports oyster shell calcium is hard to find.  Plan D/c calcium carbonate  Patient to start Citracal 630mg  D 1000iu Obtain new Vitamin D level Update medication list  Continue current medications   Medication Management   [facility] medications Uses pill box? Yes Pt endorses 80% compliance  We discussed:  Didn't want to do eye surgery. Cataracts taken out. No vitamins and herbals Resistant to the idea of deliveries..mail order or local.  Plan  Continue current medication management strategy   Follow up: 3 month phone visit  Milus Height, PharmD, [place], [facility] [phone]

## 2019-07-11 NOTE — Patient Instructions (Signed)
Visit Information  Goals Addressed            This Visit's Progress   . Chronic Care Management       CARE PLAN ENTRY  Current Barriers:  . Chronic Disease Management support, education, and care coordination needs related to Hypertension, Hyperlipidemia, Diabetes, and Osteoporosis   Hypertension . Pharmacist Clinical Goal(s): o Over the next 90 days, patient will work with PharmD and providers to maintain BP goal <130/80 . Current regimen:  o Olmesartan 20mg  daily . Interventions: o Measure BP at home . Patient self care activities - Over the next 90 days, patient will: o Get home BP monitor o Check BP daily, document, and provide at future appointments o Ensure daily salt intake < 2300 mg/day  Hyperlipidemia . Pharmacist Clinical Goal(s): o Over the next 90 days, patient will work with PharmD and providers to maintain LDL goal < 70 . Current regimen:  o Pravastatin 40mg  at bedtime . Interventions: o None . Patient self care activities - Over the next 90 days, patient will: o Continue to take pravastatin at bedtime (prescribed as daily)  Diabetes . Pharmacist Clinical Goal(s): o Over the next 90 days, patient will work with PharmD and providers to maintain A1c goal <7% . Current regimen:  o Metformin 750mg  daily . Interventions: o None . Patient self care activities - Over the next 90 days, patient will: o Check blood sugar once daily, document, and provide at future appointments o Contact provider with any episodes of hypoglycemia  Osteoporosis . Pharmacist Clinical Goal(s) o Over the next 90 days, patient will work with PharmD and providers to ensure adequate calcium and vitamin D intake.  o Ensure monitoring. Last vitamin D level in June 2019 . Current regimen:  o Citracal 630mg  + 1000iu D3 o Vitamin D2 50000 weekly . Interventions: o Vitamin D level at next scheduled lab draw o Restart calcium supplement as Citracal  . Patient self care activities - Over  the next 90 days, patient will: o Will not stop calcium supplement o [place] if concerned about new drug formulations  Medication management . Pharmacist Clinical Goal(s): o Over the next 90 days, patient will work with PharmD and providers to maintain optimal medication adherence . [facility] . Interventions o Comprehensive medication review performed. o Continue current medication management strategy . Patient self care activities - Over the next 90 days, patient will: o Focus on medication adherence by asking Clinical Pharmacy questions when needed o Take medications as prescribed o Report any concerns to PharmD and/or provider(s)  Initial goal documentation        Ms. Downum was given information about Chronic Care Management services today including:  1. CCM service includes personalized support from designated clinical staff supervised by her physician, including individualized plan of care and coordination with other care providers 2. 24/7 contact phone numbers for assistance for urgent and routine care needs. 3. Standard insurance, coinsurance, copays and deductibles apply for chronic care management only during months in which we provide at least 20 minutes of these services. Most insurances cover these services at 100%, however patients may be responsible for any copay, coinsurance and/or deductible if applicable. This service may help you avoid the need for more expensive face-to-face services. 4. Only one practitioner may furnish and bill the service in a calendar month. 5. The patient may stop CCM services at any time (effective at the end of the month) by phone call to the office  staff.  Patient agreed to services and verbal consent obtained.   Print copy of patient instructions provided.  Telephone follow up appointment with pharmacy team member scheduled for: 3 months  Milus Height, PharmD, [place], [place] [phone]

## 2019-07-14 ENCOUNTER — Other Ambulatory Visit
Admission: RE | Admit: 2019-07-14 | Discharge: 2019-07-14 | Disposition: A | Discharge status: Home / Self Care | Payer: Medicare Other | Source: Ambulatory Visit | Attending: Gastroenterology | Admitting: Gastroenterology

## 2019-07-14 ENCOUNTER — Ambulatory Visit: Payer: Medicare Other | Admitting: Physical Therapy

## 2019-07-14 ENCOUNTER — Other Ambulatory Visit: Payer: Self-pay

## 2019-07-14 DIAGNOSIS — Z20822 Contact with and (suspected) exposure to covid-19: Secondary | ICD-10-CM | POA: Diagnosis not present

## 2019-07-14 DIAGNOSIS — Z01812 Encounter for preprocedural laboratory examination: Secondary | ICD-10-CM | POA: Diagnosis not present

## 2019-07-14 LAB — SARS CORONAVIRUS 2 (TAT 6-24 HRS): SARS Coronavirus 2: NEGATIVE

## 2019-07-16 ENCOUNTER — Ambulatory Visit: Payer: Medicare Other | Admitting: Physical Therapy

## 2019-07-16 ENCOUNTER — Ambulatory Visit: Payer: Medicare Other | Admitting: Certified Registered Nurse Anesthetist

## 2019-07-16 ENCOUNTER — Encounter: Payer: Self-pay | Admitting: Gastroenterology

## 2019-07-16 ENCOUNTER — Ambulatory Visit
Admission: RE | Admit: 2019-07-16 | Discharge: 2019-07-16 | Disposition: A | Discharge status: Home / Self Care | Payer: Medicare Other | Attending: Gastroenterology | Admitting: Gastroenterology

## 2019-07-16 ENCOUNTER — Encounter
Admission: RE | Disposition: A | Discharge status: Home / Self Care | Payer: Self-pay | Source: Home / Self Care | Attending: Gastroenterology

## 2019-07-16 DIAGNOSIS — Z9049 Acquired absence of other specified parts of digestive tract: Secondary | ICD-10-CM | POA: Diagnosis not present

## 2019-07-16 DIAGNOSIS — Z8249 Family history of ischemic heart disease and other diseases of the circulatory system: Secondary | ICD-10-CM | POA: Diagnosis not present

## 2019-07-16 DIAGNOSIS — I10 Essential (primary) hypertension: Secondary | ICD-10-CM | POA: Diagnosis not present

## 2019-07-16 DIAGNOSIS — N183 Chronic kidney disease, stage 3 unspecified: Secondary | ICD-10-CM | POA: Diagnosis not present

## 2019-07-16 DIAGNOSIS — D509 Iron deficiency anemia, unspecified: Secondary | ICD-10-CM | POA: Insufficient documentation

## 2019-07-16 DIAGNOSIS — E785 Hyperlipidemia, unspecified: Secondary | ICD-10-CM | POA: Insufficient documentation

## 2019-07-16 DIAGNOSIS — K219 Gastro-esophageal reflux disease without esophagitis: Secondary | ICD-10-CM | POA: Diagnosis not present

## 2019-07-16 DIAGNOSIS — E1139 Type 2 diabetes mellitus with other diabetic ophthalmic complication: Secondary | ICD-10-CM | POA: Diagnosis not present

## 2019-07-16 DIAGNOSIS — Z87442 Personal history of urinary calculi: Secondary | ICD-10-CM | POA: Insufficient documentation

## 2019-07-16 DIAGNOSIS — E119 Type 2 diabetes mellitus without complications: Secondary | ICD-10-CM | POA: Diagnosis not present

## 2019-07-16 DIAGNOSIS — Z808 Family history of malignant neoplasm of other organs or systems: Secondary | ICD-10-CM | POA: Insufficient documentation

## 2019-07-16 DIAGNOSIS — Z90710 Acquired absence of both cervix and uterus: Secondary | ICD-10-CM | POA: Diagnosis not present

## 2019-07-16 DIAGNOSIS — E669 Obesity, unspecified: Secondary | ICD-10-CM | POA: Diagnosis not present

## 2019-07-16 DIAGNOSIS — M109 Gout, unspecified: Secondary | ICD-10-CM | POA: Insufficient documentation

## 2019-07-16 DIAGNOSIS — M810 Age-related osteoporosis without current pathological fracture: Secondary | ICD-10-CM | POA: Diagnosis not present

## 2019-07-16 DIAGNOSIS — Z6835 Body mass index (BMI) 35.0-35.9, adult: Secondary | ICD-10-CM | POA: Insufficient documentation

## 2019-07-16 DIAGNOSIS — I499 Cardiac arrhythmia, unspecified: Secondary | ICD-10-CM | POA: Insufficient documentation

## 2019-07-16 DIAGNOSIS — Z888 Allergy status to other drugs, medicaments and biological substances status: Secondary | ICD-10-CM | POA: Diagnosis not present

## 2019-07-16 DIAGNOSIS — Z79899 Other long term (current) drug therapy: Secondary | ICD-10-CM | POA: Diagnosis not present

## 2019-07-16 DIAGNOSIS — E1122 Type 2 diabetes mellitus with diabetic chronic kidney disease: Secondary | ICD-10-CM | POA: Diagnosis not present

## 2019-07-16 DIAGNOSIS — Z9841 Cataract extraction status, right eye: Secondary | ICD-10-CM | POA: Insufficient documentation

## 2019-07-16 DIAGNOSIS — I129 Hypertensive chronic kidney disease with stage 1 through stage 4 chronic kidney disease, or unspecified chronic kidney disease: Secondary | ICD-10-CM | POA: Diagnosis not present

## 2019-07-16 DIAGNOSIS — Z9842 Cataract extraction status, left eye: Secondary | ICD-10-CM | POA: Insufficient documentation

## 2019-07-16 DIAGNOSIS — M1990 Unspecified osteoarthritis, unspecified site: Secondary | ICD-10-CM | POA: Insufficient documentation

## 2019-07-16 DIAGNOSIS — H42 Glaucoma in diseases classified elsewhere: Secondary | ICD-10-CM | POA: Insufficient documentation

## 2019-07-16 DIAGNOSIS — Z823 Family history of stroke: Secondary | ICD-10-CM | POA: Insufficient documentation

## 2019-07-16 DIAGNOSIS — R634 Abnormal weight loss: Secondary | ICD-10-CM

## 2019-07-16 DIAGNOSIS — Z791 Long term (current) use of non-steroidal anti-inflammatories (NSAID): Secondary | ICD-10-CM | POA: Insufficient documentation

## 2019-07-16 HISTORY — PX: COLONOSCOPY WITH PROPOFOL: SHX5780

## 2019-07-16 LAB — GLUCOSE, CAPILLARY: Glucose-Capillary: 129 mg/dL — ABNORMAL HIGH (ref 70–99)

## 2019-07-16 SURGERY — COLONOSCOPY WITH PROPOFOL
Anesthesia: General

## 2019-07-16 MED ORDER — LIDOCAINE HCL (CARDIAC) PF 100 MG/5ML IV SOSY
PREFILLED_SYRINGE | INTRAVENOUS | Status: DC | PRN
  Administered 2019-07-16: 50 mg via INTRAVENOUS

## 2019-07-16 MED ORDER — PROPOFOL 500 MG/50ML IV EMUL
INTRAVENOUS | Status: AC
  Filled 2019-07-16: qty 50

## 2019-07-16 MED ORDER — PROPOFOL 10 MG/ML IV BOLUS
INTRAVENOUS | Status: DC | PRN
  Administered 2019-07-16: 70 mg via INTRAVENOUS

## 2019-07-16 MED ORDER — PROPOFOL 500 MG/50ML IV EMUL
INTRAVENOUS | Status: DC | PRN
  Administered 2019-07-16: 150 ug/kg/min via INTRAVENOUS

## 2019-07-16 MED ORDER — SODIUM CHLORIDE 0.9 % IV SOLN
INTRAVENOUS | Status: DC
  Administered 2019-07-16: 1000 mL via INTRAVENOUS

## 2019-07-16 NOTE — H&P (Signed)
Taylor Antigua, MD [address] [place], [place], [place], [address] [address], [place], [place], [place], [address] Phone: [phone]  Fax: [phone]  Primary Care Physician:  [name], MD   Pre-Procedure History & Physical: HPI:  Taylor [name] is a 86 y.o. female is here for a colonoscopy.   Past Medical History:  Diagnosis Date  . Abnormal mammogram, unspecified 2013  . Anemia   . Arthritis   . Breast screening, unspecified 2013  . Diabetes mellitus without complication ([place]) [id number]   non insulin dependent  . Dysrhythmia    IRREGULAR HEART BEAT  . GERD (gastroesophageal reflux disease)   . Glaucoma 2003  . Gout   . History of kidney stones    H/O  . Hyperlipidemia 2008  . Hypertension 1980's  . Lump or mass in breast 01/03/2012   left breast  . Obesity, unspecified 2013  . Osteoporosis   . Shingles 2013  . Special screening for malignant neoplasms, colon 2013    Past Surgical History:  Procedure Laterality Date  . ABDOMINAL HYSTERECTOMY  1958   menorrhagia  . BACK SURGERY    . BREAST BIOPSY Left 01/25/2012  . BREAST BIOPSY Right 04/08/2019   hear clip, [place] bx, path pending  . BREAST LUMPECTOMY WITH NEEDLE LOCALIZATION Right 05/12/2019   Procedure: BREAST LUMPECTOMY WITH NEEDLE LOCALIZATION;  Surgeon: Olean Ree, MD;  Location: [facility] ORS;  Service: General;  Laterality: Right;  . CATARACT EXTRACTION, BILATERAL Bilateral    1st 06/07/15 and the 2nd 06/21/15  . CHOLECYSTECTOMY    . COLONOSCOPY  2003   [facility]  . COLONOSCOPY WITH PROPOFOL N/A 07/02/2019   Procedure: COLONOSCOPY WITH PROPOFOL;  Surgeon: Virgel Manifold, MD;  Location: [facility] ENDOSCOPY;  Service: Endoscopy;  Laterality: N/A;  . ESOPHAGOGASTRODUODENOSCOPY (EGD) WITH PROPOFOL N/A 07/02/2019   Procedure: ESOPHAGOGASTRODUODENOSCOPY (EGD) WITH PROPOFOL;  Surgeon: Virgel Manifold, MD;  Location: [facility] ENDOSCOPY;  Service: Endoscopy;  Laterality: N/A;  . EYE SURGERY    . KNEE  SURGERY  2009,2011,2012   twice on right and once on left  . KNEE SURGERY    . LIPOMA EXCISION  1998  . [place]  2004  . TONSILLECTOMY     age of 66    Prior to Admission medications   Medication Sig Start Date End Date Taking? Authorizing Provider  acetaminophen (TYLENOL) 500 MG tablet Take 1 tablet (500 mg total) by mouth 3 (three) times daily. Patient taking differently: Take 500 mg by mouth every 6 (six) hours as needed for moderate pain or headache.  12/12/15  Yes Sowles, Drue Stager, MD  amLODipine (NORVASC) 10 MG tablet TAKE 1 TABLET BY MOUTH EVERY DAY Patient taking differently: Take 10 mg by mouth every morning.  02/01/19  Yes Sowles, Drue Stager, MD  AZOPT 1 % ophthalmic suspension Place 1 drop into both eyes in the morning and at bedtime.  06/02/18  Yes [provider]  bimatoprost (LUMIGAN) 0.01 % SOLN Place 1 drop into both eyes at bedtime.    Yes [provider]  brimonidine-timolol (COMBIGAN) 0.2-0.5 % ophthalmic solution Place 1 drop into both eyes 2 (two) times daily.    Yes [provider]  Calcium Carbonate-Vitamin D (OYSTER SHELL CALCIUM 500 + D PO) Take 1 tablet by mouth 2 (two) times a day.   Yes [provider]  denosumab (PROLIA) 60 MG/ML SOSY injection Inject into the skin. 05/25/19  Yes [provider]  famotidine (PEPCID) 20 MG tablet TAKE 1  TABLET BY MOUTH EVERY DAY Patient taking differently: Take 20 mg by mouth every morning.  02/02/19  Yes Sowles, Drue Stager, MD  ferrous sulfate 325 (65 FE) MG tablet TAKE 1 TABLET BY MOUTH EVERY DAY WITH BREAKFAST 05/08/19  Yes Sowles, Drue Stager, MD  fluticasone (FLONASE) 50 MCG/ACT nasal spray SPRAY 2 SPRAYS INTO EACH NOSTRIL EVERY DAY 07/09/19  Yes Sowles, Drue Stager, MD  ibuprofen (ADVIL) 600 MG tablet Take 1 tablet (600 mg total) by mouth every 8 (eight) hours as needed for mild pain or moderate pain. 05/12/19  Yes Piscoya, Jacqulyn Bath, MD  metFORMIN (GLUCOPHAGE-XR) 750 MG 24 hr tablet Take 1 tablet  (750 mg total) by mouth daily with breakfast. 04/17/19  Yes Sowles, Drue Stager, MD  olmesartan (BENICAR) 20 MG tablet Take 1 tablet (20 mg total) by mouth daily. Patient taking differently: Take 20 mg by mouth every morning.  04/17/19  Yes Sowles, Drue Stager, MD  omega-3 acid ethyl esters (LOVAZA) 1 g capsule TAKE 1 CAPSULE (1 G TOTAL) BY MOUTH 2 (TWO) TIMES DAILY. 05/08/19  Yes [name], MD  OneTouch Delica Lancets [id number] MISC USE AS DIRECTED 05/14/19  Yes [name], MD  [facility] ULTRA test strip USE AS DIRECTED 04/14/19  Yes Ancil Boozer, Drue Stager, MD  oxyCODONE (OXY IR/ROXICODONE) 5 MG immediate release tablet Take 1 tablet (5 mg total) by mouth every 4 (four) hours as needed for severe pain. 05/12/19  Yes Piscoya, Jacqulyn Bath, MD  pravastatin (PRAVACHOL) 40 MG tablet Take 1 tablet (40 mg total) by mouth daily. Patient taking differently: Take 40 mg by mouth at bedtime.  04/17/19  Yes Sowles, Drue Stager, MD  Vitamin D, Ergocalciferol, (DRISDOL) 1.25 MG (50000 UNIT) CAPS capsule Take 50,000 Units by mouth every Wednesday. 04/17/19  Yes [provider]  oxyCODONE-acetaminophen (PERCOCET) 7.5-325 MG tablet Take 1 tablet by mouth every 8 (eight) hours as needed for moderate pain or severe pain. Patient not taking: Reported on 07/08/2019 07/02/19 08/01/19  [name], MD    Allergies as of 07/03/2019 - Review Complete 07/02/2019  Allergen Reaction Noted  . Ace inhibitors Cough 04/03/2017    Family History  Problem Relation Age of Onset  . Cancer Mother        skin cancer  . Stroke Mother   . Heart disease Father   . Breast cancer Neg Hx     Social History   Socioeconomic History  . Marital status: Divorced    Spouse name: Not on file  . Number of children: 4  . Years of education: Not on file  . Highest education level: Some college, no degree  Occupational History  . 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 Smoking status: Never Smoker  .  Smokeless tobacco: Never Used  Substance and Sexual Activity  . Alcohol use: No    Alcohol/week: 0.0 standard drinks  . Drug use: No  . Sexual activity: Not Currently  Other Topics Concern  . Not on file  Social History Narrative   Lives alone   Children do not live in town   Friend helps out: Cira Rue - [phone]   Independent prior to her fall 10/2016   Social Determinants of Health   Financial Resource Strain: Low Risk   . Difficulty of Paying Living Expenses: Not very hard  Food Insecurity: No Food Insecurity  . Worried About Charity fundraiser in the Last Year: Never true  . Ran Out of Food in  the Last Year: Never true  Transportation Needs: No Transportation Needs  . Lack of Transportation (Medical): No  . Lack of Transportation (Non-Medical): No  Physical Activity: Inactive  . Days of Exercise per Week: 0 days  . Minutes of Exercise per Session: 0 min  Stress: No Stress Concern Present  . Feeling of Stress : Not at all  Social Connections: Slightly Isolated  . Frequency of Communication with Friends and Family: More than three times a week  . Frequency of Social Gatherings with Friends and Family: Twice a week  . Attends Religious Services: More than 4 times per year  . Active Member of Clubs or Organizations: Yes  . Attends Archivist Meetings: More than 4 times per year  . Marital Status: Divorced  Human resources officer Violence: Not At Risk  . Fear of Current or Ex-Partner: No  . Emotionally Abused: No  . Physically Abused: No  . Sexually Abused: No    Review of Systems: See HPI, otherwise negative ROS  Physical Exam: BP 130/67   Pulse (!) 116   Temp (!) 97.2 F (36.2 C) (Temporal)   Resp 18   Ht 5\' 1"  (1.549 m)   Wt 86.4 kg   SpO2 99%   BMI 35.97 kg/m  General:   Alert,  pleasant and cooperative in NAD Head:  Normocephalic and atraumatic. Neck:  Supple; no masses or thyromegaly. Lungs:  Clear throughout to auscultation, normal  respiratory effort.    Heart:  +S1, +S2, Regular rate and rhythm, No edema. Abdomen:  Soft, nontender and nondistended. Normal bowel sounds, without guarding, and without rebound.   Neurologic:  Alert and  oriented x4;  grossly normal neurologically.  Impression/Plan: GIANI SCHABEN is here for a colonoscopy to be performed for iron def anemia  Risks, benefits, limitations, and alternatives regarding  colonoscopy have been reviewed with the patient.  Questions have been answered.  All parties agreeable.   Virgel Manifold, MD  07/16/2019, 11:10 AM

## 2019-07-16 NOTE — Transfer of Care (Signed)
Immediate Anesthesia Transfer of Care Note  Patient: Taylor [name]  Procedure(s) Performed: COLONOSCOPY WITH PROPOFOL (N/A )  Patient Location: PACU  Anesthesia Type:General  Level of Consciousness: sedated  Airway & Oxygen Therapy: Patient Spontanous Breathing and Patient connected to nasal cannula oxygen  Post-op Assessment: Report given to RN and Post -op Vital signs reviewed and stable  Post vital signs: Reviewed and stable  Last Vitals:  Vitals Value Taken Time  BP 112/60 07/16/19 1130  Temp 36.4 C 07/16/19 1129  Pulse 63 07/16/19 1130  Resp 25 07/16/19 1130  SpO2 90 % 07/16/19 1130    Last Pain:  Vitals:   07/16/19 1129  TempSrc:   PainSc: Asleep         Complications: No apparent anesthesia complications

## 2019-07-16 NOTE — Anesthesia Postprocedure Evaluation (Signed)
Anesthesia Post Note  Patient: Taylor [name]  Procedure(s) Performed: COLONOSCOPY WITH PROPOFOL (N/A )  Patient location during evaluation: Endoscopy Anesthesia Type: General Level of consciousness: awake and alert Pain management: pain level controlled Vital Signs Assessment: post-procedure vital signs reviewed and stable Respiratory status: spontaneous breathing, nonlabored ventilation, respiratory function stable and patient connected to nasal cannula oxygen Cardiovascular status: blood pressure returned to baseline and stable Postop Assessment: no apparent nausea or vomiting Anesthetic complications: no     Last Vitals:  Vitals:   07/16/19 1139 07/16/19 1149  BP: 125/69 136/70  Pulse: 60 60  Resp: 14 13  Temp:    SpO2: 100% 99%    Last Pain:  Vitals:   07/16/19 1149  TempSrc:   PainSc: 5                  Martha Clan

## 2019-07-16 NOTE — Anesthesia Preprocedure Evaluation (Signed)
Anesthesia Evaluation  Patient identified by MRN, date of birth, ID band Patient awake    Reviewed: Allergy & Precautions, H&P , NPO status , reviewed documented beta blocker date and time   History of Anesthesia Complications Negative for: history of anesthetic complications  Airway Mallampati: III  TM Distance: >3 FB Neck ROM: full    Dental  (+) Upper Dentures, Partial Lower   Pulmonary neg pulmonary ROS,    Pulmonary exam normal        Cardiovascular hypertension, (-) angina(-) Past MI and (-) Cardiac Stents Normal cardiovascular exam+ dysrhythmias (-) Valvular Problems/Murmurs  NSR on EKG    Neuro/Psych negative neurological ROS  negative psych ROS   GI/Hepatic Neg liver ROS, GERD  ,  Endo/Other  diabetes  Renal/GU Renal disease     Musculoskeletal  (+) Arthritis ,   Abdominal   Peds  Hematology  (+) Blood dyscrasia, anemia ,   Anesthesia Other Findings Past Medical History: 2013: Abnormal mammogram, unspecified No date: Anemia No date: Arthritis 2013: Breast screening, unspecified 1998: Diabetes mellitus without complication ([place])     Comment:  non insulin dependent No date: Dysrhythmia     Comment:  IRREGULAR HEART BEAT No date: GERD (gastroesophageal reflux disease) 2003: Glaucoma No date: Gout No date: History of kidney stones     Comment:  H/O 2008: Hyperlipidemia 1980's: Hypertension 01/03/2012: Lump or mass in breast     Comment:  left breast 2013: Obesity, unspecified No date: Osteoporosis 2013: Shingles 2013: Special screening for malignant neoplasms, colon  Past Surgical History: 1958: ABDOMINAL HYSTERECTOMY     Comment:  menorrhagia No date: BACK SURGERY 01/25/2012: BREAST BIOPSY; Left 04/08/2019: BREAST BIOPSY; Right     Comment:  hear clip, [place] bx, path pending 05/12/2019: BREAST LUMPECTOMY WITH NEEDLE LOCALIZATION; Right     Comment:  Procedure: BREAST LUMPECTOMY WITH NEEDLE  LOCALIZATION;                Surgeon: Olean Ree, MD;  Location: [facility] ORS;                Service: General;  Laterality: Right; No date: CATARACT EXTRACTION, BILATERAL; Bilateral     Comment:  1st 06/07/15 and the 2nd 06/21/15 No date: CHOLECYSTECTOMY 2003: COLONOSCOPY     Comment:  [facility] 2009,2011,2012: KNEE SURGERY     Comment:  twice on right and once on left No date: [place]: LIPOMA EXCISION 2004: SPINE SURGERY No date: TONSILLECTOMY     Comment:  age of 16     Reproductive/Obstetrics                             Anesthesia Physical  Anesthesia Plan  ASA: III  Anesthesia Plan: General   Post-op Pain Management:    Induction: Intravenous  PONV Risk Score and Plan: Treatment may vary due to age or medical condition, TIVA and Propofol infusion  Airway Management Planned: Nasal Cannula and Natural Airway  Additional Equipment:   Intra-op Plan:   Post-operative Plan:   Informed Consent: I have reviewed the patients History and Physical, chart, labs and discussed the procedure including the risks, benefits and alternatives for the proposed anesthesia with the patient or authorized representative who has indicated his/her understanding and acceptance.     Dental Advisory Given  Plan Discussed with: CRNA  Anesthesia Plan Comments:         Anesthesia Quick Evaluation

## 2019-07-16 NOTE — Op Note (Addendum)
[facility] Patient Name: Taylor [name] Procedure Date: 07/16/2019 11:14 AM MRN: [id number] Account #: [id number] Date of Birth: 09-10-1932 Admit Type: Outpatient Age: 86 Room: [facility] ENDO ROOM 3 Gender: Female Note Status: Finalized Procedure:             Colonoscopy Indications:           Iron deficiency anemia Providers:             Joanell Cressler B. Bonna Gains MD, MD Referring MD:          Bethena Roys. Sowles, MD (Referring MD) Medicines:             Monitored Anesthesia Care Complications:         No immediate complications. Procedure:             Pre-Anesthesia Assessment:                        - Prior to the procedure, a History and Physical was                         performed, and patient medications, allergies and                         sensitivities were reviewed. The patient's tolerance                         of previous anesthesia was reviewed.                        - The risks and benefits of the procedure and the                         sedation options and risks were discussed with the                         patient. All questions were answered and informed                         consent was obtained.                        - Patient identification and proposed procedure were                         verified prior to the procedure by the physician, the                         nurse, the anesthesiologist, the anesthetist and the                         technician. The procedure was verified in the                         pre-procedure area in the procedure room in the                         endoscopy suite.                        - Prophylactic Antibiotics: The patient  does not                         require prophylactic antibiotics.                        - ASA Grade Assessment: II - A patient with mild                         systemic disease.                        - After reviewing the risks and benefits, the patient              was deemed in satisfactory condition to undergo the                         procedure.                        - Monitored anesthesia care was determined to be                         medically necessary for this procedure based on review                         of the patient's medical history, medications, and                         prior anesthesia history.                        - The anesthesia plan was to use monitored anesthesia                         care (MAC).                        After obtaining informed consent, the colonoscope was                         passed under direct vision. Throughout the procedure,                         the patient's blood pressure, pulse, and oxygen                         saturations were monitored continuously. The                         Colonoscope was introduced through the anus with the                         intention of advancing to the cecum. The scope was                         advanced to the sigmoid colon before the procedure was                         aborted. Medications were given. The colonoscopy was  performed with ease. The patient tolerated the                         procedure well. The quality of the bowel preparation                         was poor. Findings:      The perianal and digital rectal examinations were normal.      Stool was found in the rectum and in the sigmoid colon, interfering with       visualization. Impression:            - Preparation of the colon was poor.                        - Stool in the rectum and in the sigmoid colon.                        - No specimens collected. Recommendation:        - Discharge patient to home.                        - Return to my office in 2 weeks.                        - Continue present medications.                        - The findings and recommendations were discussed with                         the patient.                         - The findings and recommendations were discussed with                         the patient's family. Procedure Code(s):     --- Professional ---                        [phone], 59, Colonoscopy, flexible; diagnostic,                         including collection of specimen(s) by brushing or                         washing, when performed (separate procedure) Diagnosis Code(s):     --- Professional ---                        D50.9, Iron deficiency anemia, unspecified CPT copyright 2019 American Medical Association. All rights reserved. The codes documented in this report are preliminary and upon coder review may  be revised to meet current compliance requirements.  [name], MD Margretta Sidle B. Bonna Gains MD, MD 07/16/2019 11:30:23 AM This report has been signed electronically. Number of Addenda: 0 Note Initiated On: 07/16/2019 11:14 AM Total Procedure Duration: 0 hours 1 minute 6 seconds  Estimated Blood Loss:  Estimated blood loss: none.      [facility]

## 2019-07-17 ENCOUNTER — Encounter: Payer: Self-pay | Admitting: *Deleted

## 2019-07-18 ENCOUNTER — Other Ambulatory Visit: Payer: Self-pay | Admitting: Family Medicine

## 2019-07-18 DIAGNOSIS — E1169 Type 2 diabetes mellitus with other specified complication: Secondary | ICD-10-CM

## 2019-07-18 DIAGNOSIS — E785 Hyperlipidemia, unspecified: Secondary | ICD-10-CM

## 2019-07-18 NOTE — Telephone Encounter (Signed)
Requested Prescriptions  Pending Prescriptions Disp Refills  . amLODipine (NORVASC) 10 MG tablet [Pharmacy Med Name: AMLODIPINE BESYLATE 10 MG TAB] 90 tablet 1    Sig: TAKE 1 TABLET BY MOUTH EVERY DAY     Cardiovascular:  Calcium Channel Blockers Passed - 07/18/2019 10:38 AM      Passed - Last BP in normal range    BP Readings from Last 1 Encounters:  07/16/19 136/70         Passed - Valid encounter within last 6 months    Recent Outpatient Visits          1 month ago Other iron deficiency anemia   [facility] [place], Drue Stager, MD   3 months ago Type 2 diabetes mellitus with microalbuminuria, without long-term current use of insulin [facility])   [facility] [place], Drue Stager, MD   7 months ago Type 2 diabetes mellitus with microalbuminuria, without long-term current use of insulin [facility])   [facility] [place], Drue Stager, MD   11 months ago Dyslipidemia associated with type 2 diabetes mellitus [facility])   [facility] [place], Drue Stager, MD   1 year ago Dyslipidemia associated with type 2 diabetes mellitus [facility])   [facility] [name], MD      Future Appointments            In 3 weeks Virgel Manifold, MD [place]   In 3 weeks [name], MD [facility] [place]   In 9 months  [facility] [place], [facility]

## 2019-07-21 DIAGNOSIS — H401113 Primary open-angle glaucoma, right eye, severe stage: Secondary | ICD-10-CM | POA: Diagnosis not present

## 2019-07-21 DIAGNOSIS — H35372 Puckering of macula, left eye: Secondary | ICD-10-CM | POA: Diagnosis not present

## 2019-07-21 DIAGNOSIS — Z961 Presence of intraocular lens: Secondary | ICD-10-CM | POA: Diagnosis not present

## 2019-07-21 DIAGNOSIS — H401122 Primary open-angle glaucoma, left eye, moderate stage: Secondary | ICD-10-CM | POA: Diagnosis not present

## 2019-07-22 ENCOUNTER — Other Ambulatory Visit: Payer: Self-pay

## 2019-07-22 ENCOUNTER — Encounter: Payer: Self-pay | Admitting: Physical Therapy

## 2019-07-22 ENCOUNTER — Ambulatory Visit: Payer: Medicare Other | Attending: Family Medicine | Admitting: Physical Therapy

## 2019-07-22 DIAGNOSIS — G8929 Other chronic pain: Secondary | ICD-10-CM | POA: Diagnosis not present

## 2019-07-22 DIAGNOSIS — M545 Low back pain, unspecified: Secondary | ICD-10-CM

## 2019-07-22 DIAGNOSIS — R262 Difficulty in walking, not elsewhere classified: Secondary | ICD-10-CM | POA: Diagnosis not present

## 2019-07-22 DIAGNOSIS — R296 Repeated falls: Secondary | ICD-10-CM

## 2019-07-22 NOTE — Therapy (Signed)
[facility] [address] [place], [address] Phone: [phone]   Fax:  [phone]  Physical Therapy Treatment  Patient Details  Name: Taylor [name] MRN: [id number] Date of Birth: 12-02-1932 No data recorded  Encounter Date: 07/22/2019  PT End of Session - 07/22/19 1502    Visit Number  32    Number of Visits  37    Date for PT Re-Evaluation  08/24/19    Authorization - Visit Number  1    Authorization - Number of Visits  10    PT Start Time  0300    PT Stop Time  0330    PT Time Calculation (min)  30 min    Equipment Utilized During Treatment  Gait belt    Activity Tolerance  Patient tolerated treatment well    Behavior During Therapy  [facility] for tasks assessed/performed       Past Medical History:  Diagnosis Date  . Abnormal mammogram, unspecified 2013  . Anemia   . Arthritis   . Breast screening, unspecified 2013  . Diabetes mellitus without complication ([place]) [id number]   non insulin dependent  . Dysrhythmia    IRREGULAR HEART BEAT  . GERD (gastroesophageal reflux disease)   . Glaucoma 2003  . Gout   . History of kidney stones    H/O  . Hyperlipidemia 2008  . Hypertension 1980's  . Lump or mass in breast 01/03/2012   left breast  . Obesity, unspecified 2013  . Osteoporosis   . Shingles 2013  . Special screening for malignant neoplasms, colon 2013    Past Surgical History:  Procedure Laterality Date  . ABDOMINAL HYSTERECTOMY  1958   menorrhagia  . BACK SURGERY    . BREAST BIOPSY Left 01/25/2012  . BREAST BIOPSY Right 04/08/2019   hear clip, [place] bx, path pending  . BREAST LUMPECTOMY WITH NEEDLE LOCALIZATION Right 05/12/2019   Procedure: BREAST LUMPECTOMY WITH NEEDLE LOCALIZATION;  Surgeon: Olean Ree, MD;  Location: [facility] ORS;  Service: General;  Laterality: Right;  . CATARACT EXTRACTION, BILATERAL Bilateral    1st 06/07/15 and the 2nd 06/21/15  . CHOLECYSTECTOMY    . COLONOSCOPY  2003    [facility]  . COLONOSCOPY WITH PROPOFOL N/A 07/02/2019   Procedure: COLONOSCOPY WITH PROPOFOL;  Surgeon: Virgel Manifold, MD;  Location: [facility];  Service: Endoscopy;  Laterality: N/A;  . COLONOSCOPY WITH PROPOFOL N/A 07/16/2019   Procedure: COLONOSCOPY WITH PROPOFOL;  Surgeon: Virgel Manifold, MD;  Location: [facility] ENDOSCOPY;  Service: Endoscopy;  Laterality: N/A;  . ESOPHAGOGASTRODUODENOSCOPY (EGD) WITH PROPOFOL N/A 07/02/2019   Procedure: ESOPHAGOGASTRODUODENOSCOPY (EGD) WITH PROPOFOL;  Surgeon: Virgel Manifold, MD;  Location: [facility] ENDOSCOPY;  Service: Endoscopy;  Laterality: N/A;  . EYE SURGERY    . KNEE SURGERY  2009,2011,2012   twice on right and once on left  . KNEE SURGERY    . LIPOMA EXCISION  1998  . [place]  2004  . TONSILLECTOMY     age of 86    There were no vitals filed for this visit.  Subjective Assessment - 07/22/19 1500    Subjective  Reports she has to re-do her colonscopy again, which she is not happy about. Reports no pain today, "other than the usual". Reports no compliance with HEP.    Pertinent History  Patinet is a 86 year old female presenting to clinic for LBP. Patient has LBP that bothers her when she stands up for  10-20mins for ADLS. Back pain does not radiate and feels achy. She can only sit without back support for 44mins without pain. Worst pain in the past week 9/10 best: 5/10. Reports she has had about 3 falls in the past year, and is usually falling backwards. Reports she is very unsteady with stepping onto varied surfaces. Ambulates with a hurry cane in her LUE and uses her RUE to furiture walk, and has a RW that she will use when she has someone to put it in her car with her when she goes out. When she gets to a store, she uses cart to help her walk. She lives alone and completes her own cooking, cleaning, but has trouble standing for the time it takes to cook/do dishing. Patient reports she gives herself bird baths in her bathroom because she  cannot step over her tub to get into her tub/shower that has a seat in it. She drives and completes her own errands. When her church was open she enjoyed attending, enjoys playing games on her Ipad, and has friends in the community that come and check on her daily, as well as being apart of the "Are You Okay" program with the [place] police (they call daily and if you do not answer they will send the police there to check on you). No steps to enter 1 story home, has a ramp. Denies sensation deficits.  Pt denies N/V, B&B changes, unexplained weight fluctuation, saddle paresthesia, fever, night sweats, or unrelenting night pain at this time.    Limitations  Sitting;Lifting;Standing;Walking    How long can you sit comfortably?  without support 10-40mins    How long can you stand comfortably?  10-3mins    How long can you walk comfortably?  with cane 21mins    Patient Stated Goals  Walk better    Pain Onset  More than a month ago         [place] setting 6 no UEsL3keeping spm above68for5mins;with patient able to maintain SPM with encouragement - STS from elevated (nustep seat) without UE2x 10with cuing for "quick" stand and controlled lower with good carry over following -Standing heel raises with BUE supported 2x 10 with min cuing to prevent forward/backward sway with good carry over   Gait Training 264ft with speed changes CGA for safety with occasional need for minA with deceleration and around turns Inside through clinic and outside down decline >5% grade to parking lot with curb step down. PT mostly supervision, butoccasional CGA for safety                        PT Education - 07/22/19 1502    Education Details  therex, gait training    Person(s) Educated  Patient    Methods  Explanation;Demonstration;Verbal cues    Comprehension  Verbalized understanding;Returned demonstration;Verbal cues required       PT Short Term Goals - 07/09/19 1451       PT SHORT TERM GOAL #1   Title  Pt will be independent with HEP in order to improve strength and decrease back pain in order to improve pain-free function at home and increase QOL    Baseline  07/09/19 some compliance with HEP 06/01/19 HEP reprinted d/t non compliance following surgery    Time  4    Period  Weeks    Status  Revised      PT SHORT TERM GOAL #2   Title  Pt will improve BERG  by at least 3 points in order to demonstrate clinically significant improvement in balance.    Baseline  07/09/19 29/46 06/01/19 26/56    Time  5    Period  Weeks    Status  On-going      PT SHORT TERM GOAL #3   Title  Pt will increase 10MWT by at least 0.13 m/s in order to demonstrate clinically significant improvement in community ambulation.    Baseline  07/09/19 0.61m/s 06/01/19 0.55m/s    Time  5    Period  Weeks    Status  Achieved      PT SHORT TERM GOAL #4   Title  Pt will be able to complete 5TSTS  in order to demonstrate clinically significant improvement in LE strength    Baseline  07/09/19 able with UE for control 34sec 06/01/19 unable    Time  6    Period  Weeks    Status  Achieved        PT Long Term Goals - 07/09/19 1500      PT LONG TERM GOAL #1   Title  Patient will obtain a score of 46/56 on the BERG to demonstrate decreased fall risk    Baseline  07/09/19 29/46 06/01/19 26/56    Time  12    Period  Weeks    Status  On-going      PT LONG TERM GOAL #2   Title  Patient will demonstrate walk speed of 0.8-1.99m/s on 10MWT in order to demonstrate household ambulator walk speed with safety    Baseline  07/09/19 0.73m/s 06/01/19 0.72m/s with supervision and SPC    Time  12    Period  Weeks    Status  On-going      PT LONG TERM GOAL #3   Title  Patient will complete 5xSTS without UE support in 14.8sec in order to demonstrate age matched strength norms    Baseline  07/09/19 34sec 06/01/19 unable to complete    Time  12    Period  Weeks    Status  On-going      PT LONG TERM GOAL #4    Title  Patient will be able to ambulate 272ft with SPC without LOB/need for external support, and without instances of LOB, in order to demonstrate safety walking household distances/into story    Baseline  07/09/19 288ft with SPC, RUE high gaurd with CGA needed x4 instances to maintain balance, no furntiture walking 06/01/19 160ft with SPC, RUE high gaurd, supervision with instances of CGA needed to maintain balance    Time  12    Period  Weeks    Status  On-going            Plan - 07/22/19 1519    Clinical Impression Statement  Session shortened d/t patient being late to session d/t weather. PT continued progression for gait endurance, dynamic balance and LE strengthening with success. Pt is continuing to increase speed difference between fast and normal speed, though she does require gaurding for safety and cuing for this. Patient is able to comply with all cuing for proper therex technique with good carry over. PT will continue progression as able.    Personal Factors and Comorbidities  Age;Fitness;Comorbidity 1;Comorbidity 2;Comorbidity 3+;Past/Current Experience;Sex    Comorbidities  HTN, HLD, GERD, osteoporosis    Examination-Activity Limitations  Bathing;Sit;Dressing;Transfers;Bed Mobility;Bend;Lift;Carry;Reach Overhead;Stand;Stairs    Examination-Participation Restrictions  Church;Laundry;Cleaning;Community Activity;Meal Prep    Stability/Clinical Decision Making  Evolving/Moderate complexity  Clinical Decision Making  Moderate    Rehab Potential  Good    PT Duration  8 weeks    PT Treatment/Interventions  Moist Heat;Traction;Gait training;Stair training;Balance training;Dry needling;Joint Manipulations;Spinal Manipulations;Passive range of motion;Manual techniques;Patient/family education;Therapeutic exercise;Electrical Stimulation;ADLs/Self Care Home Management;Therapeutic activities;Functional mobility training;Energy conservation;Neuromuscular re-education    PT Next Visit Plan   Balance training, LE/core strengthening    PT Home Exercise Plan  STS from chair, seated hip abd, seated marching    Consulted and Agree with Plan of Care  Patient       Patient will benefit from skilled therapeutic intervention in order to improve the following deficits and impairments:  Abnormal gait, Decreased balance, Decreased endurance, Decreased mobility, Difficulty walking, Hypomobility, Cardiopulmonary status limiting activity, Decreased range of motion, Improper body mechanics, Decreased activity tolerance, Decreased coordination, Decreased safety awareness, Decreased strength, Increased fascial restricitons, Impaired flexibility, Postural dysfunction, Pain  Visit Diagnosis: Difficulty in walking, not elsewhere classified  Chronic midline low back pain without sciatica  Repeated falls     Problem List Patient Active Problem List   Diagnosis Date Noted  . Gastric polyp   . Polyp of colon   . Morbid obesity ([place]) 04/08/2018  . Age-related osteoporosis without current pathological fracture 09/26/2017  . Pain in both hands 09/23/2017  . Hyperparathyroidism ([place]) 08/06/2017  . Chronic kidney disease, stage III (moderate) 08/06/2017  . HLD (hyperlipidemia) 07/25/2017  . Osteoporotic compression fracture of spine ([place]) 03/22/2017  . Assistance needed with transportation 03/21/2017  . Iron deficiency anemia 12/19/2016  . Aortic atherosclerosis ([place]) 11/23/2016  . Anterolisthesis 11/23/2016  . Chronic pain syndrome 09/13/2016  . Vitamin D deficiency 08/19/2016  . Anemia, unspecified 08/19/2016  . Primary open angle glaucoma (POAG) of both eyes, severe stage 08/17/2016  . Hypertensive retinopathy 08/17/2016  . Long term current use of opiate analgesic 07/19/2016  . Dyslipidemia associated with type 2 diabetes mellitus ([place]) 04/16/2016  . Chronic midline low back pain without sciatica 04/16/2016  . Chronic pain of right knee 04/16/2016  . Vaginal dryness 02/22/2015  .  Calculus of kidney 11/10/2014  . Osteoarthritis, multiple sites 09/09/2014  . Type 2 diabetes mellitus with microalbuminuria ([place]) 09/09/2014  . Glaucoma 09/09/2014  . Benign hypertension with CKD (chronic kidney disease) stage III ([place]) 09/09/2014  . Chronic pain 09/09/2014  . Tinea corporis 09/09/2014  . Umbilical hernia [id number]  . Mass of right breast    Shelton Silvas PT, DPT Shelton Silvas 07/22/2019, 3:37 PM  [facility] [address] [place], [address] Phone: [phone]   Fax:  [phone]  Name: Taylor [name] MRN: [id number] Date of Birth: Jun 27, 1932

## 2019-07-28 ENCOUNTER — Encounter: Payer: Self-pay | Admitting: Physical Therapy

## 2019-07-28 ENCOUNTER — Ambulatory Visit: Payer: Medicare Other | Admitting: Physical Therapy

## 2019-07-28 ENCOUNTER — Other Ambulatory Visit: Payer: Self-pay

## 2019-07-28 DIAGNOSIS — G8929 Other chronic pain: Secondary | ICD-10-CM | POA: Diagnosis not present

## 2019-07-28 DIAGNOSIS — R262 Difficulty in walking, not elsewhere classified: Secondary | ICD-10-CM

## 2019-07-28 DIAGNOSIS — M545 Low back pain: Secondary | ICD-10-CM | POA: Diagnosis not present

## 2019-07-28 DIAGNOSIS — R296 Repeated falls: Secondary | ICD-10-CM

## 2019-07-28 NOTE — Therapy (Signed)
[facility] [address] [place], [address] Phone: [phone]   Fax:  [phone]  Physical Therapy Treatment  Patient Details  Name: Taylor [name] MRN: [id number] Date of Birth: 1932/09/25 No data recorded  Encounter Date: 07/28/2019  PT End of Session - 07/28/19 1443    Visit Number  33    Number of Visits  15    Date for PT Re-Evaluation  08/24/19    Authorization - Visit Number  2    Authorization - Number of Visits  10    PT Start Time  0236    PT Stop Time  0315    PT Time Calculation (min)  39 min    Equipment Utilized During Treatment  Gait belt    Activity Tolerance  Patient tolerated treatment well    Behavior During Therapy  [facility] for tasks assessed/performed       Past Medical History:  Diagnosis Date  . Abnormal mammogram, unspecified 2013  . Anemia   . Arthritis   . Breast screening, unspecified 2013  . Diabetes mellitus without complication ([place]) 2585   non insulin dependent  . Dysrhythmia    IRREGULAR HEART BEAT  . GERD (gastroesophageal reflux disease)   . Glaucoma 2003  . Gout   . History of kidney stones    H/O  . Hyperlipidemia 2008  . Hypertension 1980's  . Lump or mass in breast 01/03/2012   left breast  . Obesity, unspecified 2013  . Osteoporosis   . Shingles 2013  . Special screening for malignant neoplasms, colon 2013    Past Surgical History:  Procedure Laterality Date  . ABDOMINAL HYSTERECTOMY  1958   menorrhagia  . BACK SURGERY    . BREAST BIOPSY Left 01/25/2012  . BREAST BIOPSY Right 04/08/2019   hear clip, [place] bx, path pending  . BREAST LUMPECTOMY WITH NEEDLE LOCALIZATION Right 05/12/2019   Procedure: BREAST LUMPECTOMY WITH NEEDLE LOCALIZATION;  Surgeon: Olean Ree, MD;  Location: [facility] ORS;  Service: General;  Laterality: Right;  . CATARACT EXTRACTION, BILATERAL Bilateral    1st 06/07/15 and the 2nd 06/21/15  . CHOLECYSTECTOMY    . COLONOSCOPY  2003    [facility]  . COLONOSCOPY WITH PROPOFOL N/A 07/02/2019   Procedure: COLONOSCOPY WITH PROPOFOL;  Surgeon: Virgel Manifold, MD;  Location: [facility];  Service: Endoscopy;  Laterality: N/A;  . COLONOSCOPY WITH PROPOFOL N/A 07/16/2019   Procedure: COLONOSCOPY WITH PROPOFOL;  Surgeon: Virgel Manifold, MD;  Location: [facility];  Service: Endoscopy;  Laterality: N/A;  . ESOPHAGOGASTRODUODENOSCOPY (EGD) WITH PROPOFOL N/A 07/02/2019   Procedure: ESOPHAGOGASTRODUODENOSCOPY (EGD) WITH PROPOFOL;  Surgeon: Virgel Manifold, MD;  Location: [facility];  Service: Endoscopy;  Laterality: N/A;  . EYE SURGERY    . KNEE SURGERY  2009,2011,2012   twice on right and once on left  . KNEE SURGERY    . LIPOMA EXCISION  1998  . [place]  2004  . TONSILLECTOMY     age of 6    There were no vitals filed for this visit.  Subjective Assessment - 07/28/19 1440    Subjective  Reports she did some of her HEP this weekend, but not a whole lot. She reports no pain today.    Pertinent History  Patinet is a 86 year old female presenting to clinic for LBP. Patient has LBP that bothers her when she stands up for 10-5mins for ADLS. Back pain does not radiate  and feels achy. She can only sit without back support for 53mins without pain. Worst pain in the past week 9/10 best: 5/10. Reports she has had about 3 falls in the past year, and is usually falling backwards. Reports she is very unsteady with stepping onto varied surfaces. Ambulates with a hurry cane in her LUE and uses her RUE to furiture walk, and has a RW that she will use when she has someone to put it in her car with her when she goes out. When she gets to a store, she uses cart to help her walk. She lives alone and completes her own cooking, cleaning, but has trouble standing for the time it takes to cook/do dishing. Patient reports she gives herself bird baths in her bathroom because she cannot step over her tub to get into her tub/shower that has  a seat in it. She drives and completes her own errands. When her church was open she enjoyed attending, enjoys playing games on her Ipad, and has friends in the community that come and check on her daily, as well as being apart of the "Are You Okay" program with the [place] police (they call daily and if you do not answer they will send the police there to check on you). No steps to enter 1 story home, has a ramp. Denies sensation deficits.  Pt denies N/V, B&B changes, unexplained weight fluctuation, saddle paresthesia, fever, night sweats, or unrelenting night pain at this time.    Limitations  Sitting;Lifting;Standing;Walking    How long can you sit comfortably?  without support 10-34mins    How long can you stand comfortably?  10-61mins    How long can you walk comfortably?  with cane 87mins    Patient Stated Goals  Walk better    Pain Onset  More than a month ago       [place] setting 6 no UEsL3keeping spm above1for5mins;with patient able to maintain SPM with encouragement - Alt side stepping over 6in hurdle 2x 10 with BUE supported  - Standing trunk rotations with BUE flex to hold yellow tball, one instance of sit when she had a cough, supervision otherwise  Gait Training 175ft with speed changes CGA for safety with occasional need for minA with deceleration and around turns 195ft with cone negotiation of a group of 4 and 3 cones; minA needed with cuing to "push through cane during first cluster, with decent carry over over second group with only CGA needed for safety Inside through clinic and outside down decline >5% grade to parking lot with curb step down. PT mostly supervision, butoccasional CGA for safety                           PT Education - 07/28/19 1441    Education Details  therex form/technique, gait training    Person(s) Educated  Patient    Methods  Explanation;Demonstration;Verbal cues    Comprehension  Verbalized  understanding;Returned demonstration;Verbal cues required       PT Short Term Goals - 07/09/19 1451      PT SHORT TERM GOAL #1   Title  Pt will be independent with HEP in order to improve strength and decrease back pain in order to improve pain-free function at home and increase QOL    Baseline  07/09/19 some compliance with HEP 06/01/19 HEP reprinted d/t non compliance following surgery    Time  4    Period  Weeks  Status  Revised      PT SHORT TERM GOAL #2   Title  Pt will improve BERG by at least 3 points in order to demonstrate clinically significant improvement in balance.    Baseline  07/09/19 29/46 06/01/19 26/56    Time  5    Period  Weeks    Status  On-going      PT SHORT TERM GOAL #3   Title  Pt will increase 10MWT by at least 0.13 m/s in order to demonstrate clinically significant improvement in community ambulation.    Baseline  07/09/19 0.17m/s 06/01/19 0.32m/s    Time  5    Period  Weeks    Status  Achieved      PT SHORT TERM GOAL #4   Title  Pt will be able to complete 5TSTS  in order to demonstrate clinically significant improvement in LE strength    Baseline  07/09/19 able with UE for control 34sec 06/01/19 unable    Time  6    Period  Weeks    Status  Achieved        PT Long Term Goals - 07/09/19 1500      PT LONG TERM GOAL #1   Title  Patient will obtain a score of 46/56 on the BERG to demonstrate decreased fall risk    Baseline  07/09/19 29/46 06/01/19 26/56    Time  12    Period  Weeks    Status  On-going      PT LONG TERM GOAL #2   Title  Patient will demonstrate walk speed of 0.8-1.49m/s on 10MWT in order to demonstrate household ambulator walk speed with safety    Baseline  07/09/19 0.50m/s 06/01/19 0.58m/s with supervision and SPC    Time  12    Period  Weeks    Status  On-going      PT LONG TERM GOAL #3   Title  Patient will complete 5xSTS without UE support in 14.8sec in order to demonstrate age matched strength norms    Baseline  07/09/19 34sec  06/01/19 unable to complete    Time  12    Period  Weeks    Status  On-going      PT LONG TERM GOAL #4   Title  Patient will be able to ambulate 253ft with SPC without LOB/need for external support, and without instances of LOB, in order to demonstrate safety walking household distances/into story    Baseline  07/09/19 258ft with SPC, RUE high gaurd with CGA needed x4 instances to maintain balance, no furntiture walking 06/01/19 159ft with SPC, RUE high gaurd, supervision with instances of CGA needed to maintain balance    Time  12    Period  Weeks    Status  On-going            Plan - 07/28/19 1541    Clinical Impression Statement  PT continued therex progression for increased dynamic balance, LE strength, and increased upright tolerance with good success. Pt requires gaurding and occasional assistance for safety, but is able to maintain balance with this and cuing. Patient is motivated throughout session without increased pain.    Personal Factors and Comorbidities  Age;Fitness;Comorbidity 1;Comorbidity 2;Comorbidity 3+;Past/Current Experience;Sex    Comorbidities  HTN, HLD, GERD, osteoporosis    Examination-Activity Limitations  Bathing;Sit;Dressing;Transfers;Bed Mobility;Bend;Lift;Carry;Reach Overhead;Stand;Stairs    Examination-Participation Restrictions  Church;Laundry;Cleaning;Community Activity;Meal Prep    Stability/Clinical Decision Making  Evolving/Moderate complexity    Clinical Decision Making  Moderate    Rehab Potential  Good    PT Frequency  2x / week    PT Duration  8 weeks    PT Treatment/Interventions  Moist Heat;Traction;Gait training;Stair training;Balance training;Dry needling;Joint Manipulations;Spinal Manipulations;Passive range of motion;Manual techniques;Patient/family education;Therapeutic exercise;Electrical Stimulation;ADLs/Self Care Home Management;Therapeutic activities;Functional mobility training;Energy conservation;Neuromuscular re-education    PT Next  Visit Plan  Balance training, LE/core strengthening    PT Home Exercise Plan  STS from chair, seated hip abd, seated marching    Consulted and Agree with Plan of Care  Patient       Patient will benefit from skilled therapeutic intervention in order to improve the following deficits and impairments:  Abnormal gait, Decreased balance, Decreased endurance, Decreased mobility, Difficulty walking, Hypomobility, Cardiopulmonary status limiting activity, Decreased range of motion, Improper body mechanics, Decreased activity tolerance, Decreased coordination, Decreased safety awareness, Decreased strength, Increased fascial restricitons, Impaired flexibility, Postural dysfunction, Pain  Visit Diagnosis: Difficulty in walking, not elsewhere classified  Chronic midline low back pain without sciatica  Repeated falls     Problem List Patient Active Problem List   Diagnosis Date Noted  . Gastric polyp   . Polyp of colon   . Morbid obesity ([place]) 04/08/2018  . Age-related osteoporosis without current pathological fracture 09/26/2017  . Pain in both hands 09/23/2017  . Hyperparathyroidism ([place]) 08/06/2017  . Chronic kidney disease, stage III (moderate) 08/06/2017  . HLD (hyperlipidemia) 07/25/2017  . Osteoporotic compression fracture of spine ([place]) 03/22/2017  . Assistance needed with transportation 03/21/2017  . Iron deficiency anemia 12/19/2016  . Aortic atherosclerosis ([place]) 11/23/2016  . Anterolisthesis 11/23/2016  . Chronic pain syndrome 09/13/2016  . Vitamin D deficiency 08/19/2016  . Anemia, unspecified 08/19/2016  . Primary open angle glaucoma (POAG) of both eyes, severe stage 08/17/2016  . Hypertensive retinopathy 08/17/2016  . Long term current use of opiate analgesic 07/19/2016  . Dyslipidemia associated with type 2 diabetes mellitus ([place]) 04/16/2016  . Chronic midline low back pain without sciatica 04/16/2016  . Chronic pain of right knee 04/16/2016  . Vaginal dryness  02/22/2015  . Calculus of kidney 11/10/2014  . Osteoarthritis, multiple sites 09/09/2014  . Type 2 diabetes mellitus with microalbuminuria ([place]) 09/09/2014  . Glaucoma 09/09/2014  . Benign hypertension with CKD (chronic kidney disease) stage III ([place]) 09/09/2014  . Chronic pain 09/09/2014  . Tinea corporis 09/09/2014  . Umbilical hernia 16/11/9602  . Mass of right breast    Taylor [name] DPT Taylor [name] 07/28/2019, 3:48 PM  [facility] [address] [place], [address] Phone: [phone]   Fax:  [phone]  Name: JANESE RADABAUGH MRN: [id number] Date of Birth: [birth year]

## 2019-08-01 ENCOUNTER — Other Ambulatory Visit: Payer: Self-pay | Admitting: Family Medicine

## 2019-08-01 DIAGNOSIS — J3089 Other allergic rhinitis: Secondary | ICD-10-CM

## 2019-08-01 NOTE — Telephone Encounter (Signed)
Requested Prescriptions  Pending Prescriptions Disp Refills  . fluticasone (FLONASE) 50 MCG/ACT nasal spray [Pharmacy Med Name: FLUTICASONE PROP 50 MCG SPRAY] 16 mL 0    Sig: SPRAY 2 SPRAYS INTO EACH NOSTRIL EVERY DAY     Ear, Nose, and Throat: Nasal Preparations - Corticosteroids Passed - 08/01/2019 11:33 AM      Passed - Valid encounter within last 12 months    Recent Outpatient Visits          2 months ago Other iron deficiency anemia   [facility] [place], Drue Stager, MD   3 months ago Type 2 diabetes mellitus with microalbuminuria, without long-term current use of insulin [facility])   [facility] [place], Drue Stager, MD   7 months ago Type 2 diabetes mellitus with microalbuminuria, without long-term current use of insulin [facility])   [facility] [place], Drue Stager, MD   11 months ago Dyslipidemia associated with type 2 diabetes mellitus [facility])   [facility] [place], Drue Stager, MD   1 year ago Dyslipidemia associated with type 2 diabetes mellitus [facility])   [facility] [name], MD      Future Appointments            In 1 week Virgel Manifold, MD [place]   In 1 week [name], MD [facility], [place]   In 9 months  [facility]

## 2019-08-03 ENCOUNTER — Telehealth: Payer: Self-pay | Admitting: Family Medicine

## 2019-08-04 ENCOUNTER — Ambulatory Visit: Payer: Medicare Other | Admitting: Physical Therapy

## 2019-08-06 ENCOUNTER — Ambulatory Visit: Payer: Medicare Other | Admitting: Physical Therapy

## 2019-08-06 ENCOUNTER — Encounter (INDEPENDENT_AMBULATORY_CARE_PROVIDER_SITE_OTHER): Payer: Medicare Other | Admitting: Ophthalmology

## 2019-08-06 ENCOUNTER — Other Ambulatory Visit: Payer: Self-pay

## 2019-08-06 DIAGNOSIS — H35342 Macular cyst, hole, or pseudohole, left eye: Secondary | ICD-10-CM | POA: Diagnosis not present

## 2019-08-06 DIAGNOSIS — H35033 Hypertensive retinopathy, bilateral: Secondary | ICD-10-CM

## 2019-08-06 DIAGNOSIS — I10 Essential (primary) hypertension: Secondary | ICD-10-CM

## 2019-08-06 DIAGNOSIS — H43813 Vitreous degeneration, bilateral: Secondary | ICD-10-CM

## 2019-08-07 ENCOUNTER — Other Ambulatory Visit: Payer: Self-pay

## 2019-08-07 DIAGNOSIS — Z1231 Encounter for screening mammogram for malignant neoplasm of breast: Secondary | ICD-10-CM

## 2019-08-10 ENCOUNTER — Ambulatory Visit: Payer: Medicare Other | Admitting: Physical Therapy

## 2019-08-10 DIAGNOSIS — M5412 Radiculopathy, cervical region: Secondary | ICD-10-CM | POA: Diagnosis not present

## 2019-08-10 DIAGNOSIS — M25512 Pain in left shoulder: Secondary | ICD-10-CM | POA: Diagnosis not present

## 2019-08-10 DIAGNOSIS — M542 Cervicalgia: Secondary | ICD-10-CM | POA: Diagnosis not present

## 2019-08-11 ENCOUNTER — Ambulatory Visit: Payer: Medicare Other | Admitting: Gastroenterology

## 2019-08-12 ENCOUNTER — Other Ambulatory Visit: Payer: Self-pay

## 2019-08-12 ENCOUNTER — Ambulatory Visit (INDEPENDENT_AMBULATORY_CARE_PROVIDER_SITE_OTHER): Payer: Medicare Other | Admitting: Gastroenterology

## 2019-08-12 ENCOUNTER — Encounter: Payer: Self-pay | Admitting: Gastroenterology

## 2019-08-12 VITALS — BP 159/89 | HR 85 | Temp 98.2°F | Wt 187.6 lb

## 2019-08-12 DIAGNOSIS — D509 Iron deficiency anemia, unspecified: Secondary | ICD-10-CM

## 2019-08-12 DIAGNOSIS — K5900 Constipation, unspecified: Secondary | ICD-10-CM

## 2019-08-12 MED ORDER — LUBIPROSTONE 24 MCG PO CAPS: 24.0000 ug | ORAL_CAPSULE | Freq: Two times a day (BID) | ORAL | 3 refills | Status: DC

## 2019-08-12 NOTE — Progress Notes (Signed)
Taylor Antigua, MD [address]  [place]  [place], [place] [address]  Main: [phone]  Fax: [phone]   Primary Care Physician: [name], MD   Chief Complaint  Patient presents with  . IDA    HPI: Taylor [name] is a 86 y.o. female with iron deficiency anemia here for follow-up.  Patient presents with her family in the room.  EGD showed 2 gastric polyps, otherwise normal.  Colonoscopy was incomplete due to poor prep, with scope advanced to the ascending colon.  2, 8 to 10 mm colon polyps removed.  Pathology showed hyperplastic polyps in the stomach, and tubular adenoma colon polyps. repeat colonoscopy showed a poor prep and scope was advanced to sigmoid colon and then procedure aborted.  Patient reports chronic constipation.  Started taking MiraLAX once daily over the last week.  Still is not having bowel movements regularly.  Goes 2 weeks without a bowel  The patient denies abdominal or flank pain, anorexia, nausea or vomiting, dysphagia, change in bowel habits or black or bloody stools or weight loss.  Current Outpatient Medications  Medication Sig Dispense Refill  . acetaminophen (TYLENOL) 500 MG tablet Take 1 tablet (500 mg total) by mouth 3 (three) times daily. (Patient taking differently: Take 500 mg by mouth every 6 (six) hours as needed for moderate pain or headache. ) 90 tablet 0  . amLODipine (NORVASC) 10 MG tablet TAKE 1 TABLET BY MOUTH EVERY DAY 90 tablet 1  . AZOPT 1 % ophthalmic suspension Place 1 drop into both eyes in the morning and at bedtime.     . Baclofen 5 MG TABS Take 1 tablet by mouth daily.    . bimatoprost (LUMIGAN) 0.01 % SOLN Place 1 drop into both eyes at bedtime.     . brimonidine-timolol (COMBIGAN) 0.2-0.5 % ophthalmic solution Place 1 drop into both eyes 2 (two) times daily.     . Calcium Carbonate-Vitamin D (OYSTER SHELL CALCIUM 500 + D PO) Take 1 tablet by mouth 2 (two) times a day.    . denosumab (PROLIA) 60 MG/ML SOSY  injection Inject into the skin.    . famotidine (PEPCID) 20 MG tablet TAKE 1 TABLET BY MOUTH EVERY DAY (Patient taking differently: Take 20 mg by mouth every morning. ) 90 tablet 1  . ferrous sulfate 325 (65 FE) MG tablet TAKE 1 TABLET BY MOUTH EVERY DAY WITH BREAKFAST 90 tablet 1  . fluticasone (FLONASE) 50 MCG/ACT nasal spray SPRAY 2 SPRAYS INTO EACH NOSTRIL EVERY DAY 16 mL 0  . HYDROcodone-acetaminophen (NORCO/VICODIN) 5-325 MG tablet Take 1 tablet by mouth 3 (three) times daily as needed.    [name] ibuprofen (ADVIL) 600 MG tablet Take 1 tablet (600 mg total) by mouth every 8 (eight) hours as needed for mild pain or moderate pain. 30 tablet 0  . metFORMIN (GLUCOPHAGE-XR) 750 MG 24 hr tablet Take 1 tablet (750 mg total) by mouth daily with breakfast. 90 tablet 0  . olmesartan (BENICAR) 20 MG tablet Take 1 tablet (20 mg total) by mouth daily. (Patient taking differently: Take 20 mg by mouth every morning. ) 90 tablet 0  . omega-3 acid ethyl esters (LOVAZA) 1 g capsule TAKE 1 CAPSULE (1 G TOTAL) BY MOUTH 2 (TWO) TIMES DAILY. 180 capsule 0  . Omega-3 Fatty Acids (FISH OIL) 1000 MG CAPS Take 1 capsule by mouth daily.    [name] Delica Lancets 71I MISC USE AS DIRECTED 100 each 2  . ONETOUCH ULTRA test  strip USE AS DIRECTED 100 strip 12  . oxyCODONE (OXY IR/ROXICODONE) 5 MG immediate release tablet Take 1 tablet (5 mg total) by mouth every 4 (four) hours as needed for severe pain. 30 tablet 0  . pravastatin (PRAVACHOL) 40 MG tablet Take 1 tablet (40 mg total) by mouth daily. (Patient taking differently: Take 40 mg by mouth at bedtime. ) 90 tablet 1  . predniSONE (DELTASONE) 10 MG tablet Take 10 mg by mouth 2 (two) times daily.    . Vitamin D, Ergocalciferol, (DRISDOL) 1.25 MG (50000 UNIT) CAPS capsule Take 50,000 Units by mouth every Wednesday.     No current facility-administered medications for this visit.    Allergies as of 08/12/2019 - Review Complete 08/12/2019  Allergen Reaction Noted  . Ace  inhibitors Cough 04/03/2017    ROS:  General: Negative for anorexia, weight loss, fever, chills, fatigue, weakness. ENT: Negative for hoarseness, difficulty swallowing , nasal congestion. CV: Negative for chest pain, angina, palpitations, dyspnea on exertion, peripheral edema.  Respiratory: Negative for dyspnea at rest, dyspnea on exertion, cough, sputum, wheezing.  GI: See history of present illness. GU:  Negative for dysuria, hematuria, urinary incontinence, urinary frequency, nocturnal urination.  Endo: Negative for unusual weight change.    Physical Examination:   BP (!) 159/89   Pulse 85   Temp 98.2 F (36.8 C) (Oral)   Wt 187 lb 9.6 oz (85.1 kg)   BMI 35.45 kg/m   General: Well-nourished, well-developed in no acute distress.  Eyes: No icterus. Conjunctivae pink. Mouth: Oropharyngeal mucosa moist and pink , no lesions erythema or exudate. Neck: Supple, Trachea midline Abdomen: Bowel sounds are normal, nontender, nondistended, no hepatosplenomegaly or masses, no abdominal bruits or hernia , no rebound or guarding.   Extremities: No lower extremity edema. No clubbing or deformities. Neuro: Alert and oriented x 3.  Grossly intact. Skin: Warm and dry, no jaundice.   Psych: Alert and cooperative, normal mood and affect.   Labs: CMP     Component Value Date/Time   NA 140 04/17/2019 1512   NA 141 08/09/2015 0921   K 4.5 04/17/2019 1512   CL 109 04/17/2019 1512   CO2 21 04/17/2019 1512   GLUCOSE 114 (H) 04/17/2019 1512   BUN 22 04/17/2019 1512   BUN 22 08/09/2015 0921   CREATININE 1.21 (H) 04/17/2019 1512   CALCIUM 10.0 04/17/2019 1512   PROT 7.2 04/17/2019 1512   PROT 6.5 08/09/2015 0921   ALBUMIN 4.0 08/14/2016 1012   ALBUMIN 4.1 08/09/2015 0921   AST 11 04/17/2019 1512   ALT 9 04/17/2019 1512   ALKPHOS 56 08/14/2016 1012   BILITOT 0.3 04/17/2019 1512   BILITOT 0.3 08/09/2015 0921   GFRNONAA 41 (L) 04/17/2019 1512   GFRAA 48 (L) 04/17/2019 1512   Lab Results   Component Value Date   WBC 7.3 04/17/2019   HGB 12.1 04/17/2019   HCT 37.1 04/17/2019   MCV 92.1 04/17/2019   PLT 332 04/17/2019    Imaging Studies: No results found.  Assessment and Plan:   Taylor [name] is a 86 y.o. y/o female with iron deficiency anemia  Had extensive discussion with the patient today to discuss if she would like to proceed with another colonoscopy given her previous ones were incomplete due to poor prep  Alternative options of conservative management were discussed in detail, including but not limited to medication management, foregoing endoscopic procedures at this time and others.    Patient would like to proceed  with colonoscopy  We will start on Amitiza for chronic constipation  Schedule for repeat colonoscopy for iron deficiency anemia with 2-day prep  Virtual visit 4 to 7 days before procedure to see how her bowel movements are doing before she starts the prep  I have discussed alternative options, risks & benefits,  which include, but are not limited to, bleeding, infection, perforation,respiratory complication & drug reaction.  The patient agrees with this plan & written consent will be obtained.     Dr Taylor [name]

## 2019-08-12 NOTE — Patient Instructions (Signed)
Please STOP taking your Miralax.   Please start taking Amitiza. This was sent to your pharmacy.  This time for your colonoscopy you will do a 2 day prep. This is provided to you.

## 2019-08-13 ENCOUNTER — Ambulatory Visit: Payer: Medicare Other | Admitting: Physical Therapy

## 2019-08-14 ENCOUNTER — Other Ambulatory Visit: Payer: Self-pay

## 2019-08-14 ENCOUNTER — Encounter: Payer: Self-pay | Admitting: Family Medicine

## 2019-08-14 ENCOUNTER — Ambulatory Visit (INDEPENDENT_AMBULATORY_CARE_PROVIDER_SITE_OTHER): Payer: Medicare Other | Admitting: Family Medicine

## 2019-08-14 VITALS — BP 180/70 | HR 64 | Temp 96.6°F | Resp 16 | Ht 61.0 in | Wt 185.4 lb

## 2019-08-14 DIAGNOSIS — E1169 Type 2 diabetes mellitus with other specified complication: Secondary | ICD-10-CM

## 2019-08-14 DIAGNOSIS — R809 Proteinuria, unspecified: Secondary | ICD-10-CM

## 2019-08-14 DIAGNOSIS — N1831 Chronic kidney disease, stage 3a: Secondary | ICD-10-CM

## 2019-08-14 DIAGNOSIS — I10 Essential (primary) hypertension: Secondary | ICD-10-CM | POA: Diagnosis not present

## 2019-08-14 DIAGNOSIS — E1129 Type 2 diabetes mellitus with other diabetic kidney complication: Secondary | ICD-10-CM

## 2019-08-14 DIAGNOSIS — E7800 Pure hypercholesterolemia, unspecified: Secondary | ICD-10-CM | POA: Diagnosis not present

## 2019-08-14 DIAGNOSIS — E782 Mixed hyperlipidemia: Secondary | ICD-10-CM | POA: Diagnosis not present

## 2019-08-14 DIAGNOSIS — I700 Atherosclerosis of aorta: Secondary | ICD-10-CM

## 2019-08-14 DIAGNOSIS — E785 Hyperlipidemia, unspecified: Secondary | ICD-10-CM

## 2019-08-14 DIAGNOSIS — G8929 Other chronic pain: Secondary | ICD-10-CM

## 2019-08-14 DIAGNOSIS — M810 Age-related osteoporosis without current pathological fracture: Secondary | ICD-10-CM

## 2019-08-14 DIAGNOSIS — M8949 Other hypertrophic osteoarthropathy, multiple sites: Secondary | ICD-10-CM

## 2019-08-14 DIAGNOSIS — M159 Polyosteoarthritis, unspecified: Secondary | ICD-10-CM

## 2019-08-14 DIAGNOSIS — R269 Unspecified abnormalities of gait and mobility: Secondary | ICD-10-CM

## 2019-08-14 DIAGNOSIS — M545 Low back pain: Secondary | ICD-10-CM

## 2019-08-14 LAB — POCT GLYCOSYLATED HEMOGLOBIN (HGB A1C): Hemoglobin A1C: 6.7 % — AB (ref 4.0–5.6)

## 2019-08-14 MED ORDER — OLMESARTAN MEDOXOMIL 20 MG PO TABS: 20.0000 mg | ORAL_TABLET | ORAL | 1 refills | Status: DC

## 2019-08-14 MED ORDER — OMEGA-3-ACID ETHYL ESTERS 1 G PO CAPS: 1.0000 g | ORAL_CAPSULE | Freq: Two times a day (BID) | ORAL | 0 refills | Status: DC

## 2019-08-14 MED ORDER — PRAVASTATIN SODIUM 40 MG PO TABS: 40.0000 mg | ORAL_TABLET | Freq: Every day | ORAL | 1 refills | Status: DC

## 2019-08-14 MED ORDER — METFORMIN HCL ER 750 MG PO TB24: 750.0000 mg | ORAL_TABLET | Freq: Every day | ORAL | 0 refills | Status: DC

## 2019-08-14 NOTE — Progress Notes (Signed)
Name: ROCIO ROAM   MRN: [id number]    DOB: 05-Jul-1932   Date:08/14/2019       Progress Note  Subjective  Chief Complaint  Chief Complaint  Patient presents with  . Diabetes  . Hypertension  . Hyperlipidemia  . Nerve pain    She was evaluated at [facility]). Dx with pinched nerve, she was treated with baclofen, Norco, and Prednisone.  . Memory Loss    Caretaker and family are concerned about her being really forgetful. She does not want to go to a nursing home. They think she is unable to stay alone. Daughter is coming down tomorrow to check on her.    HPI  Weight loss: her weight went from 205 lbs to 183 lbs back in Feb 2021. She did not change her diet. Since last visit she was seen by Dr. Hampton Abbot - breast lump was fibroadenoma , she had a virtual visit with Dr. Bonna Gains for evaluation of iron deficiency anemia and had EGD and colonoscopy. Her weight is stable since last visit   AL:[id number] glucoseis usually around 150's   taking metformin 750 mg daily since last visit, she used to take 1500 mg She denies polyphagia, polydipsia or polyuria. Eye exam isdue and already has an appointment scheduled.. She has a history of microalbuminuria and is taking ARB, she also has CKI andis under the care ofDr. Candiss Norse. She takesLovaza and statinfor dyslipidemia and denies side effects.She denies side effects of medication. Hgb1C today is up to 6.7 %     Morbid obesity: VMI above 35 with DM, HTN, dyslipidemia, GERD, because of her age and recent weight unintentional weight loss advised her not to lose more weight   Hearing loss: very difficulty to hear [place], had to repeat same question multiple times, she has her hearing aids again today . I had to remove my mask so it could make it easier for her to read my lips   HTN with CKI stage III: also has secondary hyperparathyroidism, under the care of nephrologist, Dr. Candiss Norse.No pruritis noticed, good urine outputLast GFR was down to 35.  Her bp is very high, she states today she has been in a lot of pain and did not take her medications   Chronic pain: she has chronic right knee pain, under the care of pain clinic and uses a walker to assist with ambulation. She also has daily back pain and feels weak on her back, she states she pulled a muscle on left side of her back and pain is more intense today she was given baclofen , hydrocodone and prednisone taper given by urgent care last week and is still in a lot of pain, she has follow up with Dr. Andree Elk on Monday   GERD: well controlled with Pepcid, no heartburn or indigestion. Denies change in bowel movements. Unchanged    Atherosclerosis aorta: on statin therapy not on aspirin because ofhigh risk of falls . Continue medication  Osteoporosis: seeing Dr. Honor Junes for Prolia and is tolerating it well.  Her cousin brought her in and told CMA that she is concerned Ms. Hendricksen has memory problems. Patient states she did not want her here because " she talks out of terms", she also told me that her daughter lives in [place] and is not ready to move in with her but thinks it would be helpful having someone with her to help her take medications and prep her meals because sometimes her pain is so intense that she does not  have motivation  Patient Active Problem List   Diagnosis Date Noted  . Gastric polyp   . Polyp of colon   . Morbid obesity ([place]) 04/08/2018  . Age-related osteoporosis without current pathological fracture 09/26/2017  . Pain in both hands 09/23/2017  . Hyperparathyroidism ([place]) 08/06/2017  . Chronic kidney disease, stage III (moderate) 08/06/2017  . HLD (hyperlipidemia) 07/25/2017  . Osteoporotic compression fracture of spine ([place]) 03/22/2017  . Assistance needed with transportation 03/21/2017  . Iron deficiency anemia 12/19/2016  . Aortic atherosclerosis ([place]) 11/23/2016  . Anterolisthesis 11/23/2016  . Chronic pain syndrome 09/13/2016  . Vitamin D  deficiency 08/19/2016  . Anemia, unspecified 08/19/2016  . Primary open angle glaucoma (POAG) of both eyes, severe stage 08/17/2016  . Hypertensive retinopathy 08/17/2016  . Long term current use of opiate analgesic 07/19/2016  . Dyslipidemia associated with type 2 diabetes mellitus ([place]) 04/16/2016  . Chronic midline low back pain without sciatica 04/16/2016  . Chronic pain of right knee 04/16/2016  . Vaginal dryness 02/22/2015  . Calculus of kidney 11/10/2014  . Osteoarthritis, multiple sites 09/09/2014  . Type 2 diabetes mellitus with microalbuminuria ([place]) 09/09/2014  . Glaucoma 09/09/2014  . Benign hypertension with CKD (chronic kidney disease) stage III ([place]) 09/09/2014  . Chronic pain 09/09/2014  . Tinea corporis 09/09/2014  . Umbilical hernia 32/20/2542  . Mass of right breast     Past Surgical History:  Procedure Laterality Date  . ABDOMINAL HYSTERECTOMY  1958   menorrhagia  . BACK SURGERY    . BREAST BIOPSY Left 01/25/2012  . BREAST BIOPSY Right 04/08/2019   hear clip, [place] bx, path pending  . BREAST LUMPECTOMY WITH NEEDLE LOCALIZATION Right 05/12/2019   Procedure: BREAST LUMPECTOMY WITH NEEDLE LOCALIZATION;  Surgeon: Olean Ree, MD;  Location: [facility] ORS;  Service: General;  Laterality: Right;  . CATARACT EXTRACTION, BILATERAL Bilateral    1st 06/07/15 and the 2nd 06/21/15  . CHOLECYSTECTOMY    . COLONOSCOPY  2003   [facility]  . COLONOSCOPY WITH PROPOFOL N/A 07/02/2019   Procedure: COLONOSCOPY WITH PROPOFOL;  Surgeon: Virgel Manifold, MD;  Location: [facility] ENDOSCOPY;  Service: Endoscopy;  Laterality: N/A;  . COLONOSCOPY WITH PROPOFOL N/A 07/16/2019   Procedure: COLONOSCOPY WITH PROPOFOL;  Surgeon: Virgel Manifold, MD;  Location: [facility] ENDOSCOPY;  Service: Endoscopy;  Laterality: N/A;  . ESOPHAGOGASTRODUODENOSCOPY (EGD) WITH PROPOFOL N/A 07/02/2019   Procedure: ESOPHAGOGASTRODUODENOSCOPY (EGD) WITH PROPOFOL;  Surgeon: Virgel Manifold, MD;  Location: [facility] ENDOSCOPY;   Service: Endoscopy;  Laterality: N/A;  . EYE SURGERY    . KNEE SURGERY  2009,2011,2012   twice on right and once on left  . KNEE SURGERY    . LIPOMA EXCISION  1998  . [place]  2004  . TONSILLECTOMY     age of 60    Family History  Problem Relation Age of Onset  . Cancer Mother        skin cancer  . Stroke Mother   . Heart disease Father   . Breast cancer Neg Hx     Social History   Tobacco Use  . Smoking status: Never Smoker  . Smokeless tobacco: Never Used  Substance Use Topics  . Alcohol use: No    Alcohol/week: 0.0 standard drinks     Current Outpatient Medications:  .  acetaminophen (TYLENOL) 500 MG tablet, Take 1 tablet (500 mg total) by mouth 3 (three) times daily. (Patient taking differently: Take 500 mg by mouth every 6 (six) hours as  needed for moderate pain or headache. ), Disp: 90 tablet, Rfl: 0 .  amLODipine (NORVASC) 10 MG tablet, TAKE 1 TABLET BY MOUTH EVERY DAY, Disp: 90 tablet, Rfl: 1 .  AZOPT 1 % ophthalmic suspension, Place 1 drop into both eyes in the morning and at bedtime. , Disp: , Rfl:  .  Baclofen 5 MG TABS, Take 1 tablet by mouth daily., Disp: , Rfl:  .  bimatoprost (LUMIGAN) 0.01 % SOLN, Place 1 drop into both eyes at bedtime. , Disp: , Rfl:  .  brimonidine-timolol (COMBIGAN) 0.2-0.5 % ophthalmic solution, Place 1 drop into both eyes 2 (two) times daily. , Disp: , Rfl:  .  Calcium Carbonate-Vitamin D (OYSTER SHELL CALCIUM 500 + D PO), Take 1 tablet by mouth 2 (two) times a day., Disp: , Rfl:  .  denosumab (PROLIA) 60 MG/ML SOSY injection, Inject into the skin., Disp: , Rfl:  .  famotidine (PEPCID) 20 MG tablet, TAKE 1 TABLET BY MOUTH EVERY DAY (Patient taking differently: Take 20 mg by mouth every morning. ), Disp: 90 tablet, Rfl: 1 .  ferrous sulfate 325 (65 FE) MG tablet, TAKE 1 TABLET BY MOUTH EVERY DAY WITH BREAKFAST, Disp: 90 tablet, Rfl: 1 .  fluticasone (FLONASE) 50 MCG/ACT nasal spray, SPRAY 2 SPRAYS INTO EACH NOSTRIL EVERY DAY, Disp:  16 mL, Rfl: 0 .  HYDROcodone-acetaminophen (NORCO/VICODIN) 5-325 MG tablet, Take 1 tablet by mouth 3 (three) times daily as needed., Disp: , Rfl:  .  ibuprofen (ADVIL) 600 MG tablet, Take 1 tablet (600 mg total) by mouth every 8 (eight) hours as needed for mild pain or moderate pain., Disp: 30 tablet, Rfl: 0 .  lubiprostone (AMITIZA) 24 MCG capsule, Take 1 capsule (24 mcg total) by mouth 2 (two) times daily with a meal., Disp: 60 capsule, Rfl: 3 .  metFORMIN (GLUCOPHAGE-XR) 750 MG 24 hr tablet, Take 1 tablet (750 mg total) by mouth daily with breakfast., Disp: 90 tablet, Rfl: 0 .  olmesartan (BENICAR) 20 MG tablet, Take 1 tablet (20 mg total) by mouth daily. (Patient taking differently: Take 20 mg by mouth every morning. ), Disp: 90 tablet, Rfl: 0 .  omega-3 acid ethyl esters (LOVAZA) 1 g capsule, TAKE 1 CAPSULE (1 G TOTAL) BY MOUTH 2 (TWO) TIMES DAILY., Disp: 180 capsule, Rfl: 0 .  Omega-3 Fatty Acids (FISH OIL) 1000 MG CAPS, Take 1 capsule by mouth daily., Disp: , Rfl:  .  OneTouch Delica Lancets 97L MISC, USE AS DIRECTED, Disp: 100 each, Rfl: 2 .  ONETOUCH ULTRA test strip, USE AS DIRECTED, Disp: 100 strip, Rfl: 12 .  pravastatin (PRAVACHOL) 40 MG tablet, Take 1 tablet (40 mg total) by mouth daily. (Patient taking differently: Take 40 mg by mouth at bedtime. ), Disp: 90 tablet, Rfl: 1 .  predniSONE (DELTASONE) 10 MG tablet, Take 10 mg by mouth 2 (two) times daily., Disp: , Rfl:  .  Vitamin D, Ergocalciferol, (DRISDOL) 1.25 MG (50000 UNIT) CAPS capsule, Take 50,000 Units by mouth every Wednesday., Disp: , Rfl:   Allergies  Allergen Reactions  . Ace Inhibitors Cough    I personally reviewed active problem list, medication list, allergies, family history, social history, health maintenance with the patient/caregiver today.   ROS  Constitutional: Negative for fever or weight change.  Respiratory: Negative for cough and shortness of breath.   Cardiovascular: Negative for chest pain or  palpitations.  Gastrointestinal: Negative for abdominal pain, no bowel changes.  Musculoskeletal: positive  for gait problem and intermittent  joint swelling.  Skin: Negative for rash.  Neurological: Negative for dizziness or headache.  No other specific complaints in a complete review of systems (except as listed in HPI above).  Objective  Vitals:   08/14/19 1510  BP: (!) 180/70  Pulse: 64  Resp: 16  Temp: (!) 96.6 F (35.9 C)  TempSrc: Temporal  SpO2: 100%  Weight: 185 lb 6.4 oz (84.1 kg)  Height: 5\' 1"  (1.549 m)    Body mass index is 35.03 kg/m.  Physical Exam  Constitutional: Patient appears well-developed and well-nourished. Obese  No distress.  HEENT: head atraumatic, normocephalic, pupils equal and reactive to light,  neck supple Cardiovascular: Normal rate, regular rhythm and normal heart sounds.  No murmur heard. No BLE edema. Pulmonary/Chest: Effort normal and breath sounds normal. No respiratory distress. Abdominal: Soft.  There is no tenderness. Muscular Skeletal: uses a walker, seems to be in pain, moves slowly. No pain during palpation, negative CVA tenderness, she has a history of kidney stones but no dysuria fever or chills. Pain aggravated by movement of spine  Psychiatric: Patient has a normal mood and affect. behavior is normal. Judgment and thought content normal.  Recent Results (from the past 2160 hour(s))  SARS CORONAVIRUS 2 (TAT 6-24 HRS) Nasopharyngeal Nasopharyngeal Swab     Status: None   Collection Time: 06/30/19 11:47 AM   Specimen: Nasopharyngeal Swab  Result Value Ref Range   SARS Coronavirus 2 NEGATIVE NEGATIVE    Comment: (NOTE) SARS-CoV-2 target nucleic acids are NOT DETECTED. The SARS-CoV-2 RNA is generally detectable in upper and lower respiratory specimens during the acute phase of infection. Negative results do not preclude SARS-CoV-2 infection, do not rule out co-infections with other pathogens, and should not be used as the sole  basis for treatment or other patient management decisions. Negative results must be combined with clinical observations, patient history, and epidemiological information. The expected result is Negative. Fact Sheet for Patients: SugarRoll.be Fact Sheet for Healthcare Providers: https://www.woods-mathews.com/ This test is not yet approved or cleared by the [place] FDA and  has been authorized for detection and/or diagnosis of SARS-CoV-2 by FDA under an Emergency Use Authorization (EUA). This EUA will remain  in effect (meaning this test can be used) for the duration of the COVID-19 declaration under Section 56 4(b)(1) of the Act, 21 U.S.C. section 360bbb-3(b)(1), unless the authorization is terminated or revoked sooner. Performed at [facility], [place] [address], [place], [place] [address]   Glucose, capillary     Status: Abnormal   Collection Time: 07/02/19 10:31 AM  Result Value Ref Range   Glucose-Capillary 144 (H) 70 - 99 mg/dL    Comment: Glucose reference range applies only to samples taken after fasting for at least 8 hours.  Surgical pathology     Status: None   Collection Time: 07/02/19 11:30 AM  Result Value Ref Range   SURGICAL PATHOLOGY      SURGICAL PATHOLOGY CASE: [id number] PATIENT: [facility] Surgical Pathology Report     Specimen Submitted: A. Duodenum; cbx B. Stomach polyp x2; cbx C. Colon polyp x2, transverse; hot snare  Clinical History: Colonoscopy and EGD iron def anemia D50.9 weight loss R63.4.  Gastric polyps, colon polyps, poor prep    DIAGNOSIS: A. DUODENUM; COLD BIOPSY: - ENTERIC MUCOSA WITH PRESERVED VILLOUS ARCHITECTURE AND NO SIGNIFICANT HISTOPATHOLOGIC CHANGE. - NEGATIVE FOR FEATURES OF CELIAC, DYSPLASIA, AND MALIGNANCY.  B. GASTRIC POLYPS X2; COLD BIOPSY: - FRAGMENTS (X3) OF HYPERPLASTIC POLYPS. - NEGATIVE FOR  DYSPLASIA AND MALIGNANCY.  C. COLON POLYPS X2, TRANSVERSE; HOT  SNARE: - FRAGMENTS (X2) OF TUBULAR ADENOMAS. - NEGATIVE FOR HIGH-GRADE DYSPLASIA AND MALIGNANCY.  GROSS DESCRIPTION: A. Labeled: Duodenum rule out celiac, cold biopsy Received: In formalin Tissue fragment(s): 2 Size: 0.5 x 0.3 x 0.2 cm Description: Aggregate of tan soft tissue fragments Entire ly submitted in cassette A1.  B. Labeled: Stomach polyp x2, cold biopsy Received: In formalin Tissue fragment(s): 3 Size: 0.8 x 0.4 x 0.2 cm Description: Aggregate of tan soft tissue fragments Entirely submitted in cassette B1.  C. Labeled: Colon polyp x2, transverse, hot snare Received: In formalin Tissue fragment(s): 2 Size: 1.0 x 0.5 x 0.4 cm Description: Aggregate of tan soft tissue fragments Entirely submitted in cassette C1.   Final Diagnosis performed by Allena Napoleon, MD.   Electronically signed 07/03/2019 8:23:06AM The electronic signature indicates that the named Attending Pathologist has evaluated the specimen Technical component performed at [facility], [address] [place], [place] [address] Lab: [phone] Dir: Rush Farmer, MD, MMM  Professional component performed at [facility], [facility], [place], [place], [place] [address] Lab: [phone] Dir: Dellia Nims. Rubinas, MD   SARS CORONAVIRUS 2 (TAT 6-24 HRS) Nasopharyngeal Nasopharyngeal Swab     Status: None   Collection Time: 07/14/19 12:54 PM   Specimen: Nasopharyngeal Swab  Result Value Ref Range   SARS Coronavirus 2 NEGATIVE NEGATIVE    Comment: (NOTE) SARS-CoV-2 target nucleic acids are NOT DETECTED. The SARS-CoV-2 RNA is generally detectable in upper and lower respiratory specimens during the acute phase of infection. Negative results do not preclude SARS-CoV-2 infection, do not rule out co-infections with other pathogens, and should not be used as the sole basis for treatment or other patient management decisions. Negative results must be combined with clinical observations, patient  history, and epidemiological information. The expected result is Negative. Fact Sheet for Patients: SugarRoll.be Fact Sheet for Healthcare Providers: https://www.woods-mathews.com/ This test is not yet approved or cleared by the [place] FDA and  has been authorized for detection and/or diagnosis of SARS-CoV-2 by FDA under an Emergency Use Authorization (EUA). This EUA will remain  in effect (meaning this test can be used) for the duration of the COVID-19 declaration under Section 56 4(b)(1) of the Act, 21 U.S.C. section 360bbb-3(b)(1), unless the authorization is terminated or revoked sooner. Performed at [facility], [place] [address] [place], [place] [address]   Glucose, capillary     Status: Abnormal   Collection Time: 07/16/19 10:22 AM  Result Value Ref Range   Glucose-Capillary 129 (H) 70 - 99 mg/dL    Comment: Glucose reference range applies only to samples taken after fasting for at least 8 hours.  POCT HgB A1C     Status: Abnormal   Collection Time: 08/14/19  3:10 PM  Result Value Ref Range   Hemoglobin A1C 6.7 (A) 4.0 - 5.6 %   HbA1c POC (<> result, manual entry)     HbA1c, POC (prediabetic range)     HbA1c, POC (controlled diabetic range)        PHQ2/9: Depression screen [facility] 2/9 08/14/2019 05/29/2019 04/28/2019 04/17/2019 12/15/2018  Decreased Interest 0 0 0 0 0  Down, Depressed, Hopeless 0 0 0 0 0  PHQ - 2 Score 0 0 0 0 0  Altered sleeping 0 0 - 0 0  Tired, decreased energy 0 0 - 0 0  Change in appetite 0 0 - 0 0  Feeling bad or failure about yourself  0 0 - 0 0  Trouble concentrating 0 0 - 0 0  Moving slowly or fidgety/restless 0 0 - 0 0  Suicidal thoughts 0 0 - 0 0  PHQ-9 Score 0 0 - 0 0  Difficult doing work/chores - - - Not difficult at all -  Some recent data might be hidden    phq 9 is negative   Fall Risk: Fall Risk  08/14/2019 05/29/2019 05/27/2019 05/25/2019 04/28/2019  Falls in the past year? 0 0 0 0 0   Comment - - - - -  Number falls in past yr: 0 0 0 0 0  Injury with Fall? 0 0 0 0 0  Risk Factor Category  - - - - -  Risk for fall due to : - - - - Impaired balance/gait;Impaired mobility;Orthopedic patient  Follow up - - - - Falls prevention discussed     Functional Status Survey: Is the patient deaf or have difficulty hearing?: Yes Does the patient have difficulty seeing, even when wearing glasses/contacts?: Yes Does the patient have difficulty concentrating, remembering, or making decisions?: No Does the patient have difficulty walking or climbing stairs?: Yes Does the patient have difficulty dressing or bathing?: Yes Does the patient have difficulty doing errands alone such as visiting a doctor's office or shopping?: Yes    Assessment & Plan   1. Type 2 diabetes mellitus with microalbuminuria, without long-term current use of insulin (HCC)  - POCT HgB A1C - metFORMIN (GLUCOPHAGE-XR) 750 MG 24 hr tablet; Take 1 tablet (750 mg total) by mouth daily with breakfast.  Dispense: 90 tablet; Refill: 0  2. Mixed hyperlipidemia  - omega-3 acid ethyl esters (LOVAZA) 1 g capsule; Take 1 capsule (1 g total) by mouth 2 (two) times daily.  Dispense: 180 capsule; Refill: 0  3. Essential hypertension  - olmesartan (BENICAR) 20 MG tablet; Take 1 tablet (20 mg total) by mouth every morning.  Dispense: 90 tablet; Refill: 1  4. Pure hypercholesterolemia  - pravastatin (PRAVACHOL) 40 MG tablet; Take 1 tablet (40 mg total) by mouth at bedtime.  Dispense: 90 tablet; Refill: 1  5. Morbid obesity ([place])  Maintain weight   6. Gait difficulty  Using walker  7. Primary osteoarthritis involving multiple joints  Stable   8. Chronic bilateral low back pain without sciatica  Keep follow up with Dr. Andree Elk  9. Dyslipidemia associated with type 2 diabetes mellitus ([place])  On statin   10. Aortic atherosclerosis ([place])  On statin therapy   11. Age-related osteoporosis without current  pathological fracture  Getting Prolia   12. Stage 3a chronic kidney disease  Slight improvement from previous value

## 2019-08-17 ENCOUNTER — Ambulatory Visit: Payer: Medicare Other | Attending: Anesthesiology | Admitting: Anesthesiology

## 2019-08-17 ENCOUNTER — Encounter: Payer: Self-pay | Admitting: Anesthesiology

## 2019-08-17 ENCOUNTER — Other Ambulatory Visit: Payer: Self-pay

## 2019-08-17 VITALS — BP 148/65 | HR 63 | Temp 97.6°F | Resp 16 | Ht 61.0 in | Wt 184.0 lb

## 2019-08-17 DIAGNOSIS — M25562 Pain in left knee: Secondary | ICD-10-CM | POA: Diagnosis not present

## 2019-08-17 DIAGNOSIS — M79642 Pain in left hand: Secondary | ICD-10-CM | POA: Diagnosis not present

## 2019-08-17 DIAGNOSIS — M79641 Pain in right hand: Secondary | ICD-10-CM | POA: Diagnosis not present

## 2019-08-17 DIAGNOSIS — Z79891 Long term (current) use of opiate analgesic: Secondary | ICD-10-CM | POA: Diagnosis not present

## 2019-08-17 DIAGNOSIS — G8929 Other chronic pain: Secondary | ICD-10-CM

## 2019-08-17 DIAGNOSIS — M545 Low back pain, unspecified: Secondary | ICD-10-CM

## 2019-08-17 DIAGNOSIS — G894 Chronic pain syndrome: Secondary | ICD-10-CM | POA: Diagnosis not present

## 2019-08-17 DIAGNOSIS — M159 Polyosteoarthritis, unspecified: Secondary | ICD-10-CM

## 2019-08-17 DIAGNOSIS — M25561 Pain in right knee: Secondary | ICD-10-CM | POA: Diagnosis not present

## 2019-08-17 DIAGNOSIS — M546 Pain in thoracic spine: Secondary | ICD-10-CM

## 2019-08-17 MED ORDER — OXYCODONE-ACETAMINOPHEN 7.5-325 MG PO TABS: 1.0000 | ORAL_TABLET | Freq: Three times a day (TID) | ORAL | 0 refills | Status: DC | PRN

## 2019-08-17 MED ORDER — BACLOFEN 5 MG PO TABS: 1.0000 | ORAL_TABLET | Freq: Every day | ORAL | 1 refills | Status: AC

## 2019-08-17 MED ORDER — OXYCODONE-ACETAMINOPHEN 7.5-325 MG PO TABS: 1.0000 | ORAL_TABLET | Freq: Three times a day (TID) | ORAL | 0 refills | Status: AC | PRN

## 2019-08-17 NOTE — Progress Notes (Signed)
Safety precautions to be maintained throughout the outpatient stay will include: orient to surroundings, keep bed in low position, maintain call bell within reach at all times, provide assistance with transfer out of bed and ambulation.    Patient did not brig medication with her today. States she is out.

## 2019-08-17 NOTE — Patient Instructions (Signed)
NEXT APPOINTMENT WILL BE VIRTUAL. YOU DO NOT NEED TO COME IN THE OFFICE. MAKE SURE YOU ANSWER THE PHONE.  NEXT APPT WILL BE vIRTUAL.

## 2019-08-17 NOTE — Progress Notes (Signed)
Subjective:  Patient ID: Taylor [name], female    DOB: Jun 03, 1932  Age: 86 y.o. MRN: [id number]  CC: Shoulder Pain (left)   Procedure: None  HPI Taylor [name] presents for reevaluation.  She was seen about 2 months ago.  She is continuing to have low back pain similar to what she previously experienced.  She has run out of her Percocet and did require an ER evaluation secondary to some left posterior subscapular shoulder pain.  She was seen in the emergency room and was given some prednisone and baclofen in addition to some hydrocodone.  The baclofen helped but she ran out after the 5-day supply was utilized and she feels that the Percocet was helping better for her overall pain in addition to the low back pain.  She has been trying to do some physical therapy exercises for the thoracic pain and these give her some relief however this has been problematic.  She still taking her prednisone.  Her low back pain is been stable with no significant changes in the quality characteristic or distribution.  Otherwise she is in her usual state of health at this time  Outpatient Medications Prior to Visit  Medication Sig Dispense Refill  . acetaminophen (TYLENOL) 500 MG tablet Take 1 tablet (500 mg total) by mouth 3 (three) times daily. (Patient taking differently: Take 500 mg by mouth every 6 (six) hours as needed for moderate pain or headache. ) 90 tablet 0  . amLODipine (NORVASC) 10 MG tablet TAKE 1 TABLET BY MOUTH EVERY DAY 90 tablet 1  . AZOPT 1 % ophthalmic suspension Place 1 drop into both eyes in the morning and at bedtime.     . bimatoprost (LUMIGAN) 0.01 % SOLN Place 1 drop into both eyes at bedtime.     . brimonidine-timolol (COMBIGAN) 0.2-0.5 % ophthalmic solution Place 1 drop into both eyes 2 (two) times daily.     . Calcium Carbonate-Vitamin D (OYSTER SHELL CALCIUM 500 + D PO) Take 1 tablet by mouth 2 (two) times a day.    . denosumab (PROLIA) 60 MG/ML SOSY injection Inject into the skin.     . famotidine (PEPCID) 20 MG tablet TAKE 1 TABLET BY MOUTH EVERY DAY (Patient taking differently: Take 20 mg by mouth every morning. ) 90 tablet 1  . ferrous sulfate 325 (65 FE) MG tablet TAKE 1 TABLET BY MOUTH EVERY DAY WITH BREAKFAST 90 tablet 1  . fluticasone (FLONASE) 50 MCG/ACT nasal spray SPRAY 2 SPRAYS INTO EACH NOSTRIL EVERY DAY 16 mL 0  . HYDROcodone-acetaminophen (NORCO/VICODIN) 5-325 MG tablet Take 1 tablet by mouth 3 (three) times daily as needed.    . lubiprostone (AMITIZA) 24 MCG capsule Take 1 capsule (24 mcg total) by mouth 2 (two) times daily with a meal. 60 capsule 3  . metFORMIN (GLUCOPHAGE-XR) 750 MG 24 hr tablet Take 1 tablet (750 mg total) by mouth daily with breakfast. 90 tablet 0  . olmesartan (BENICAR) 20 MG tablet Take 1 tablet (20 mg total) by mouth every morning. 90 tablet 1  . omega-3 acid ethyl esters (LOVAZA) 1 g capsule Take 1 capsule (1 g total) by mouth 2 (two) times daily. 180 capsule 0  . Omega-3 Fatty Acids (FISH OIL) 1000 MG CAPS Take 1 capsule by mouth daily.    [name] Delica Lancets 83J MISC USE AS DIRECTED 100 each 2  . ONETOUCH ULTRA test strip USE AS DIRECTED 100 strip 12  . pravastatin (PRAVACHOL) 40 MG tablet  Take 1 tablet (40 mg total) by mouth at bedtime. 90 tablet 1  . predniSONE (DELTASONE) 10 MG tablet Take 10 mg by mouth 2 (two) times daily.    . Vitamin D, Ergocalciferol, (DRISDOL) 1.25 MG (50000 UNIT) CAPS capsule Take 50,000 Units by mouth every Wednesday.    . Baclofen 5 MG TABS Take 1 tablet by mouth daily.     No facility-administered medications prior to visit.    Review of Systems CNS: No confusion or sedation Cardiac: No angina or palpitations GI: No abdominal pain or constipation Constitutional: No nausea vomiting fevers or chills  Objective:  BP (!) 148/65   Pulse 63   Temp 97.6 F (36.4 C)   Resp 16   Ht 5\' 1"  (1.549 m)   Wt 184 lb (83.5 kg)   SpO2 100%   BMI 34.77 kg/m    BP Readings from Last 3 Encounters:   08/17/19 (!) 148/65  08/14/19 (!) 180/70  08/12/19 (!) 159/89     Wt Readings from Last 3 Encounters:  08/17/19 184 lb (83.5 kg)  08/14/19 185 lb 6.4 oz (84.1 kg)  08/12/19 187 lb 9.6 oz (85.1 kg)     Physical Exam Pt is alert and oriented PERRL EOMI HEART IS RRR no murmur or rub LCTA no wheezing or rales MUSCULOSKELETAL.  Reveals a trigger point approximately T10 level mid thoracic and mid clavicular line approximately 5 to 6 cm to the left of midline.  This does reproduce pain with compression at this level.  No other cutaneous findings are noted no masses are palpable no crepitus to the rib margin.  Her back reveals a well-healed midline scar she is in a wheelchair today.  Her muscle tone and bulk appears to be at baseline.  Labs  Lab Results  Component Value Date   HGBA1C 6.7 (A) 08/14/2019   HGBA1C 6.5 04/17/2019   HGBA1C 6.4 (A) 12/15/2018   Lab Results  Component Value Date   MICROALBUR 1.8 04/17/2019   LDLCALC 46 12/15/2018   CREATININE 1.21 (H) 04/17/2019    -------------------------------------------------------------------------------------------------------------------- Lab Results  Component Value Date   WBC 7.3 04/17/2019   HGB 12.1 04/17/2019   HCT 37.1 04/17/2019   PLT 332 04/17/2019   GLUCOSE 114 (H) 04/17/2019   CHOL 115 12/15/2018   TRIG 137 12/15/2018   HDL 47 (L) 12/15/2018   LDLCALC 46 12/15/2018   ALT 9 04/17/2019   AST 11 04/17/2019   NA 140 04/17/2019   K 4.5 04/17/2019   CL 109 04/17/2019   CREATININE 1.21 (H) 04/17/2019   BUN 22 04/17/2019   CO2 21 04/17/2019   TSH 0.63 04/17/2019   HGBA1C 6.7 (A) 08/14/2019   MICROALBUR 1.8 04/17/2019    --------------------------------------------------------------------------------------------------------------------- No results found.   Assessment & Plan:   There are no diagnoses linked to this  encounter.      ----------------------------------------------------------------------------------------------------------------------  Problem List Items Addressed This Visit    None        ----------------------------------------------------------------------------------------------------------------------  There are no diagnoses linked to this encounter.   ----------------------------------------------------------------------------------------------------------------------  I am having Taylor [name] start on oxyCODONE-acetaminophen and oxyCODONE-acetaminophen. I am also having her maintain her bimatoprost, brimonidine-timolol, acetaminophen, Azopt, Calcium Carbonate-Vitamin D (OYSTER SHELL CALCIUM 500 + D PO), famotidine, OneTouch Ultra, Vitamin D (Ergocalciferol), ferrous sulfate, OneTouch Delica Lancets 48G, Prolia, amLODipine, fluticasone, predniSONE, HYDROcodone-acetaminophen, Fish Oil, lubiprostone, omega-3 acid ethyl esters, metFORMIN, olmesartan, pravastatin, and Baclofen.   Meds ordered this encounter  Medications  . oxyCODONE-acetaminophen (  PERCOCET) 7.5-325 MG tablet    Sig: Take 1 tablet by mouth every 8 (eight) hours as needed for moderate pain or severe pain.    Dispense:  90 tablet    Refill:  0  . oxyCODONE-acetaminophen (PERCOCET) 7.5-325 MG tablet    Sig: Take 1 tablet by mouth every 8 (eight) hours as needed for moderate pain or severe pain.    Dispense:  90 tablet    Refill:  0  . Baclofen 5 MG TABS    Sig: Take 1 tablet by mouth daily.    Dispense:  30 tablet    Refill:  1   Patient's Medications  New Prescriptions   OXYCODONE-ACETAMINOPHEN (PERCOCET) 7.5-325 MG TABLET    Take 1 tablet by mouth every 8 (eight) hours as needed for moderate pain or severe pain.   OXYCODONE-ACETAMINOPHEN (PERCOCET) 7.5-325 MG TABLET    Take 1 tablet by mouth every 8 (eight) hours as needed for moderate pain or severe pain.  Previous Medications   ACETAMINOPHEN  (TYLENOL) 500 MG TABLET    Take 1 tablet (500 mg total) by mouth 3 (three) times daily.   AMLODIPINE (NORVASC) 10 MG TABLET    TAKE 1 TABLET BY MOUTH EVERY DAY   AZOPT 1 % OPHTHALMIC SUSPENSION    Place 1 drop into both eyes in the morning and at bedtime.    BIMATOPROST (LUMIGAN) 0.01 % SOLN    Place 1 drop into both eyes at bedtime.    BRIMONIDINE-TIMOLOL (COMBIGAN) 0.2-0.5 % OPHTHALMIC SOLUTION    Place 1 drop into both eyes 2 (two) times daily.    CALCIUM CARBONATE-VITAMIN D (OYSTER SHELL CALCIUM 500 + D PO)    Take 1 tablet by mouth 2 (two) times a day.   DENOSUMAB (PROLIA) 60 MG/ML SOSY INJECTION    Inject into the skin.   FAMOTIDINE (PEPCID) 20 MG TABLET    TAKE 1 TABLET BY MOUTH EVERY DAY   FERROUS SULFATE 325 (65 FE) MG TABLET    TAKE 1 TABLET BY MOUTH EVERY DAY WITH BREAKFAST   FLUTICASONE (FLONASE) 50 MCG/ACT NASAL SPRAY    SPRAY 2 SPRAYS INTO EACH NOSTRIL EVERY DAY   HYDROCODONE-ACETAMINOPHEN (NORCO/VICODIN) 5-325 MG TABLET    Take 1 tablet by mouth 3 (three) times daily as needed.   LUBIPROSTONE (AMITIZA) 24 MCG CAPSULE    Take 1 capsule (24 mcg total) by mouth 2 (two) times daily with a meal.   METFORMIN (GLUCOPHAGE-XR) 750 MG 24 HR TABLET    Take 1 tablet (750 mg total) by mouth daily with breakfast.   OLMESARTAN (BENICAR) 20 MG TABLET    Take 1 tablet (20 mg total) by mouth every morning.   OMEGA-3 ACID ETHYL ESTERS (LOVAZA) 1 G CAPSULE    Take 1 capsule (1 g total) by mouth 2 (two) times daily.   OMEGA-3 FATTY ACIDS (FISH OIL) 1000 MG CAPS    Take 1 capsule by mouth daily.   ONETOUCH DELICA LANCETS 14H MISC    USE AS DIRECTED   ONETOUCH ULTRA TEST STRIP    USE AS DIRECTED   PRAVASTATIN (PRAVACHOL) 40 MG TABLET    Take 1 tablet (40 mg total) by mouth at bedtime.   PREDNISONE (DELTASONE) 10 MG TABLET    Take 10 mg by mouth 2 (two) times daily.   VITAMIN D, ERGOCALCIFEROL, (DRISDOL) 1.25 MG (50000 UNIT) CAPS CAPSULE    Take 50,000 Units by mouth every Wednesday.  Modified  Medications   Modified Medication  Previous Medication   BACLOFEN 5 MG TABS Baclofen 5 MG TABS      Take 1 tablet by mouth daily.    Take 1 tablet by mouth daily.  Discontinued Medications   No medications on file   1. Chronic pain syndrome   2. Chronic pain of right knee   3. Pain in both hands   4. Chronic midline low back pain without sciatica   5. Long term prescription opiate use   6. Chronic pain of both knees   7. Osteoarthritis of multiple joints, unspecified osteoarthritis type   8. Acute left-sided thoracic back pain    In regards to the acute left side mid thoracic back pain I have advised her to continue with stretching strengthening of this area and some massage.  If it is not better in a month we will talk about a trigger point injection to this area.  I am going to refill her baclofen for 5 mg tablets to be taken once a day and I am also going to restart her Percocet 7.5 mg tablets for her diffuse body pain and low back pain.  This should also help with the thoracic pain.  This will be dated for June 28 and July 28.  I have reviewed the [facility] practitioner database information and it was appropriate.  Furthermore her UDS was appropriate.  We will schedule her for return to clinic in 2 months and if the pain is not better in a month she is instructed to contact us for possible trigger point injection.  She is also instructed to continue follow-up with her primary care physicians. --------------------------------------------------------------------------------------------------------------------- In regards to her acute right side Follow-up: Return in about 2 months (around 10/17/2019) for med refill, evaluation.    [name], MD

## 2019-08-18 ENCOUNTER — Ambulatory Visit: Payer: Medicare Other | Admitting: Physical Therapy

## 2019-08-18 DIAGNOSIS — H401122 Primary open-angle glaucoma, left eye, moderate stage: Secondary | ICD-10-CM | POA: Diagnosis not present

## 2019-08-18 DIAGNOSIS — H401113 Primary open-angle glaucoma, right eye, severe stage: Secondary | ICD-10-CM | POA: Diagnosis not present

## 2019-08-18 DIAGNOSIS — H35372 Puckering of macula, left eye: Secondary | ICD-10-CM | POA: Diagnosis not present

## 2019-08-18 DIAGNOSIS — Z961 Presence of intraocular lens: Secondary | ICD-10-CM | POA: Diagnosis not present

## 2019-08-20 ENCOUNTER — Ambulatory Visit: Payer: Medicare Other | Attending: Family Medicine | Admitting: Physical Therapy

## 2019-08-26 ENCOUNTER — Telehealth: Payer: Self-pay

## 2019-08-26 DIAGNOSIS — G8929 Other chronic pain: Secondary | ICD-10-CM

## 2019-08-26 DIAGNOSIS — M545 Low back pain, unspecified: Secondary | ICD-10-CM

## 2019-08-26 DIAGNOSIS — E1129 Type 2 diabetes mellitus with other diabetic kidney complication: Secondary | ICD-10-CM

## 2019-08-27 ENCOUNTER — Other Ambulatory Visit: Payer: Self-pay | Admitting: Family Medicine

## 2019-08-27 ENCOUNTER — Other Ambulatory Visit: Payer: Self-pay

## 2019-08-27 ENCOUNTER — Ambulatory Visit: Payer: Self-pay | Admitting: *Deleted

## 2019-08-27 DIAGNOSIS — J3089 Other allergic rhinitis: Secondary | ICD-10-CM

## 2019-08-27 DIAGNOSIS — E1129 Type 2 diabetes mellitus with other diabetic kidney complication: Secondary | ICD-10-CM

## 2019-08-27 DIAGNOSIS — G8929 Other chronic pain: Secondary | ICD-10-CM

## 2019-08-27 NOTE — Chronic Care Management (AMB) (Signed)
Chronic Care Management    Clinical Social Work  Note  08/27/2019 Name: Taylor [name] MRN: [id number] DOB: [birth year]  Taylor [name] is a 86 y.o. year old female who is a primary care patient of [name], MD. The CCM team was consulted for assistance with [employer] .   Review of patient status, including review of consultants reports, other relevant assessments, and collaboration with appropriate care team members and the patient's provider was performed as part of comprehensive patient evaluation and provision of chronic care management services.    SDOH (Social Determinants of Health) assessments performed: Yes    Outpatient Encounter Medications as of 08/27/2019  Medication Sig  . acetaminophen (TYLENOL) 500 MG tablet Take 1 tablet (500 mg total) by mouth 3 (three) times daily. (Patient taking differently: Take 500 mg by mouth every 6 (six) hours as needed for moderate pain or headache. )  . amLODipine (NORVASC) 10 MG tablet TAKE 1 TABLET BY MOUTH EVERY DAY  . AZOPT 1 % ophthalmic suspension Place 1 drop into both eyes in the morning and at bedtime.   . Baclofen 5 MG TABS Take 1 tablet by mouth daily.  . bimatoprost (LUMIGAN) 0.01 % SOLN Place 1 drop into both eyes at bedtime.   . brimonidine-timolol (COMBIGAN) 0.2-0.5 % ophthalmic solution Place 1 drop into both eyes 2 (two) times daily.   . Calcium Carbonate-Vitamin D (OYSTER SHELL CALCIUM 500 + D PO) Take 1 tablet by mouth 2 (two) times a day.  . denosumab (PROLIA) 60 MG/ML SOSY injection Inject into the skin.  . famotidine (PEPCID) 20 MG tablet TAKE 1 TABLET BY MOUTH EVERY DAY (Patient taking differently: Take 20 mg by mouth every morning. )  . ferrous sulfate 325 (65 FE) MG tablet TAKE 1 TABLET BY MOUTH EVERY DAY WITH BREAKFAST  . fluticasone (FLONASE) 50 MCG/ACT nasal spray SPRAY 2 SPRAYS INTO EACH NOSTRIL EVERY DAY  . HYDROcodone-acetaminophen (NORCO/VICODIN) 5-325 MG tablet Take 1 tablet by mouth 3 (three) times  daily as needed.  . lubiprostone (AMITIZA) 24 MCG capsule Take 1 capsule (24 mcg total) by mouth 2 (two) times daily with a meal.  . metFORMIN (GLUCOPHAGE-XR) 750 MG 24 hr tablet Take 1 tablet (750 mg total) by mouth daily with breakfast.  . olmesartan (BENICAR) 20 MG tablet Take 1 tablet (20 mg total) by mouth every morning.  [name] omega-3 acid ethyl esters (LOVAZA) 1 g capsule Take 1 capsule (1 g total) by mouth 2 (two) times daily.  . Omega-3 Fatty Acids (FISH OIL) 1000 MG CAPS Take 1 capsule by mouth daily.  [name] Delica Lancets 41P MISC USE AS DIRECTED  . ONETOUCH ULTRA test strip USE AS DIRECTED  . oxyCODONE-acetaminophen (PERCOCET) 7.5-325 MG tablet Take 1 tablet by mouth every 8 (eight) hours as needed for moderate pain or severe pain.  Derrill Memo ON 09/16/2019] oxyCODONE-acetaminophen (PERCOCET) 7.5-325 MG tablet Take 1 tablet by mouth every 8 (eight) hours as needed for moderate pain or severe pain.  . pravastatin (PRAVACHOL) 40 MG tablet Take 1 tablet (40 mg total) by mouth at bedtime.  . predniSONE (DELTASONE) 10 MG tablet Take 10 mg by mouth 2 (two) times daily.  [name] ROCKLATAN 0.02-0.005 % SOLN Apply 1 drop to eye at bedtime.  . Vitamin D, Ergocalciferol, (DRISDOL) 1.25 MG (50000 UNIT) CAPS capsule Take 50,000 Units by mouth every Wednesday.   No facility-administered encounter medications on file as of 08/27/2019.     Goals Addressed  This Visit's Progress   .  "I need some help in the home set up" (pt-stated)        [place] (see longitudinal plan of care for additional care plan information)  Current Barriers:  . ADL IADL limitations  Clinical Social Work Clinical Goal(s):  [name] Over the next 90 days, patient will work with Administrator, Civil Service to address needs related to in home help  Interventions: . Inter-disciplinary care team collaboration (see longitudinal plan of care) . Patient interviewed and appropriate assessments performed . Patient  discussed need for in home support . Patient confirmed that she has friends that rotate with transportation assistance to medical appointments and to run errands but would like a private duty aid set up . Provided patient with information about personal care aids and estimated out of pocket expense . Explored alternative options including utilizing existing family and friends for existance . Patient confirmed need to discuss costs with her son and grandson before moving forward . Patient requested a call next week for follow up . Discussed plans with patient for ongoing care management follow up and provided patient with direct contact information for care management team  Patient Self Care Activities:  . Patient verbalizes understanding of plan to discuss private duty aid options with her family before a decision is made . Unable to perform ADLs independently . Unable to perform IADLs independently  Initial goal documentation         Follow Up Plan: SW will follow up with patient by phone over the next 7-14 business days   SIGNATURE

## 2019-08-27 NOTE — Patient Instructions (Addendum)
Thank you allowing the Chronic Care Management Team to be a part of your care! It was a pleasure speaking with you today!  1. Please discuss in home care options with your family  CCM (Chronic Care Management) Team   Neldon Labella RN, BSN Nurse Care Coordinator  [phone]   [place], LCSW Clinical Social Worker [phone]  Goals Addressed              This Visit's Progress   .  "I need some help in the home set up" (pt-stated)        [place] (see longitudinal plan of care for additional care plan information)  Current Barriers:  . ADL IADL limitations  Clinical Social Work Clinical Goal(s):  [name] Over the next 90 days, patient will work with Administrator, Civil Service to address needs related to in home help  Interventions: . Inter-disciplinary care team collaboration (see longitudinal plan of care) . Patient interviewed and appropriate assessments performed . Patient discussed need for in home support . Patient confirmed that she has friends that rotate with transportation assistance to medical appointments and to run errands but would like a private duty aid set up . Provided patient with information about personal care aids and estimated out of pocket expense . Explored alternative options including utilizing existing family and friends for existance . Patient confirmed need to discuss costs with her son and grandson before moving forward . Patient requested a call next week for follow up . Discussed plans with patient for ongoing care management follow up and provided patient with direct contact information for care management team  Patient Self Care Activities:  . Patient verbalizes understanding of plan to discuss private duty aid options with her family before a decision is made . Unable to perform ADLs independently . Unable to perform IADLs independently  Initial goal documentation         The patient verbalized understanding of  instructions provided today and declined a print copy of patient instruction materials.   Telephone follow up appointment with care management team member scheduled for: 08/31/19

## 2019-08-31 ENCOUNTER — Ambulatory Visit: Payer: Self-pay | Admitting: *Deleted

## 2019-08-31 DIAGNOSIS — E1129 Type 2 diabetes mellitus with other diabetic kidney complication: Secondary | ICD-10-CM

## 2019-08-31 DIAGNOSIS — M545 Low back pain, unspecified: Secondary | ICD-10-CM

## 2019-08-31 DIAGNOSIS — G8929 Other chronic pain: Secondary | ICD-10-CM

## 2019-08-31 NOTE — Chronic Care Management (AMB) (Signed)
Chronic Care Management    Clinical Social Work Follow Up Note  08/31/2019 Name: Taylor [name] MRN: [id number] DOB: [birth year]  Taylor [name] is a 86 y.o. year old female who is a primary care patient of [name], MD. The CCM team was consulted for assistance with [employer] .   Review of patient status, including review of consultants reports, other relevant assessments, and collaboration with appropriate care team members and the patient's provider was performed as part of comprehensive patient evaluation and provision of chronic care management services.    SDOH (Social Determinants of Health) assessments performed: No    Outpatient Encounter Medications as of 08/31/2019  Medication Sig  . acetaminophen (TYLENOL) 500 MG tablet Take 1 tablet (500 mg total) by mouth 3 (three) times daily. (Patient taking differently: Take 500 mg by mouth every 6 (six) hours as needed for moderate pain or headache. )  . amLODipine (NORVASC) 10 MG tablet TAKE 1 TABLET BY MOUTH EVERY DAY  . AZOPT 1 % ophthalmic suspension Place 1 drop into both eyes in the morning and at bedtime.   . Baclofen 5 MG TABS Take 1 tablet by mouth daily.  . bimatoprost (LUMIGAN) 0.01 % SOLN Place 1 drop into both eyes at bedtime.   . brimonidine-timolol (COMBIGAN) 0.2-0.5 % ophthalmic solution Place 1 drop into both eyes 2 (two) times daily.   . Calcium Carbonate-Vitamin D (OYSTER SHELL CALCIUM 500 + D PO) Take 1 tablet by mouth 2 (two) times a day.  . denosumab (PROLIA) 60 MG/ML SOSY injection Inject into the skin.  . famotidine (PEPCID) 20 MG tablet TAKE 1 TABLET BY MOUTH EVERY DAY (Patient taking differently: Take 20 mg by mouth every morning. )  . ferrous sulfate 325 (65 FE) MG tablet TAKE 1 TABLET BY MOUTH EVERY DAY WITH BREAKFAST  . fluticasone (FLONASE) 50 MCG/ACT nasal spray SPRAY 2 SPRAYS INTO EACH NOSTRIL EVERY DAY  . HYDROcodone-acetaminophen (NORCO/VICODIN) 5-325 MG tablet Take 1 tablet by mouth 3  (three) times daily as needed.  . lubiprostone (AMITIZA) 24 MCG capsule Take 1 capsule (24 mcg total) by mouth 2 (two) times daily with a meal.  . metFORMIN (GLUCOPHAGE-XR) 750 MG 24 hr tablet Take 1 tablet (750 mg total) by mouth daily with breakfast.  . olmesartan (BENICAR) 20 MG tablet Take 1 tablet (20 mg total) by mouth every morning.  [name] omega-3 acid ethyl esters (LOVAZA) 1 g capsule Take 1 capsule (1 g total) by mouth 2 (two) times daily.  . Omega-3 Fatty Acids (FISH OIL) 1000 MG CAPS Take 1 capsule by mouth daily.  [name] Delica Lancets 25K MISC USE AS DIRECTED  . ONETOUCH ULTRA test strip USE AS DIRECTED  . oxyCODONE-acetaminophen (PERCOCET) 7.5-325 MG tablet Take 1 tablet by mouth every 8 (eight) hours as needed for moderate pain or severe pain.  Derrill Memo ON 09/16/2019] oxyCODONE-acetaminophen (PERCOCET) 7.5-325 MG tablet Take 1 tablet by mouth every 8 (eight) hours as needed for moderate pain or severe pain.  . pravastatin (PRAVACHOL) 40 MG tablet Take 1 tablet (40 mg total) by mouth at bedtime.  . predniSONE (DELTASONE) 10 MG tablet Take 10 mg by mouth 2 (two) times daily.  [name] ROCKLATAN 0.02-0.005 % SOLN Apply 1 drop to eye at bedtime.  . Vitamin D, Ergocalciferol, (DRISDOL) 1.25 MG (50000 UNIT) CAPS capsule Take 50,000 Units by mouth every Wednesday.   No facility-administered encounter medications on file as of 08/31/2019.     Goals Addressed  This Visit's Progress   .  "I need some help in the home set up" (pt-stated)        [place] (see longitudinal plan of care for additional care plan information)  Current Barriers:  . ADL IADL limitations  Clinical Social Work Clinical Goal(s):  [name] Over the next 90 days, patient will work with Administrator, Civil Service to address needs related to in home help  Interventions: . Inter-disciplinary care team collaboration (see longitudinal plan of care) . Follow up call to patient to discuss need for in home  support . Patient continues to confirm that she has friends that rotate with transportation assistance to medical appointments and to run errands but would like a private duty aid set up . Patient further confirmed that her daughter is there but is only able to provide limited support . Provided patient with information about personal care aids and estimated out of pocket expense . Patient confirmed that she spoke with her son regarding assisting her with the cost in home care and discussed plan for him to speak with her grandson as well. He has not called her back yet . Collaboration phone call to [facility] to discuss referral to their case management program that includes in home assistance  . Patient requested a call in a few days for to follow up . Discussed plans with patient for ongoing care management follow up and provided patient with direct contact information for care management team  Patient Self Care Activities:  . Patient verbalizes understanding of plan to discuss private duty aid options with her family before a decision is made . Unable to perform ADLs independently . Unable to perform IADLs independently  Please see past updates related to this goal by clicking on the "Past Updates" button in the selected goal          Follow Up Plan: SW will follow up with patient by phone over the next 7-14 business days   [place], [place] Worker  [facility] Care Management [phone]

## 2019-08-31 NOTE — Patient Instructions (Signed)
Thank you allowing the Chronic Care Management Team to be a part of your care! It was a pleasure speaking with you today!  1. Please follow up with your family regarding in home care assistance  CCM (Chronic Care Management) Team   Neldon Labella RN, BSN Nurse Care Coordinator  [phone]  [place], LCSW Clinical Social Worker [phone]  Goals Addressed              This Visit's Progress   .  "I need some help in the home set up" (pt-stated)        [place] (see longitudinal plan of care for additional care plan information)  Current Barriers:  . ADL IADL limitations  Clinical Social Work Clinical Goal(s):  [name] Over the next 90 days, patient will work with Administrator, Civil Service to address needs related to in home help  Interventions: . Inter-disciplinary care team collaboration (see longitudinal plan of care) . Follow up call to patient to discuss need for in home support . Patient continues to confirm that she has friends that rotate with transportation assistance to medical appointments and to run errands but would like a private duty aid set up . Patient further confirmed that her daughter is there but is only able to provide limited support . Provided patient with information about personal care aids and estimated out of pocket expense . Patient confirmed that she spoke with her son regarding assisting her with the cost in home care and discussed plan for him to speak with her grandson as well. He has not called her back yet . Collaboration phone call to [facility] to discuss referral to their case management program that includes in home assistance  . Patient requested a call in a few days for to follow up . Discussed plans with patient for ongoing care management follow up and provided patient with direct contact information for care management team  Patient Self Care Activities:  . Patient verbalizes understanding of plan to discuss  private duty aid options with her family before a decision is made . Unable to perform ADLs independently . Unable to perform IADLs independently  Please see past updates related to this goal by clicking on the "Past Updates" button in the selected goal          The patient verbalized understanding of instructions provided today and declined a print copy of patient instruction materials.   Telephone follow up appointment with care management team member scheduled for: 09/07/19

## 2019-08-31 NOTE — Telephone Encounter (Signed)
error 

## 2019-09-02 ENCOUNTER — Telehealth (INDEPENDENT_AMBULATORY_CARE_PROVIDER_SITE_OTHER): Payer: Medicare Other | Admitting: Gastroenterology

## 2019-09-02 ENCOUNTER — Encounter: Payer: Self-pay | Admitting: Gastroenterology

## 2019-09-02 VITALS — BP 166/83 | HR 87 | Temp 98.2°F | Wt 187.0 lb

## 2019-09-02 DIAGNOSIS — M546 Pain in thoracic spine: Secondary | ICD-10-CM | POA: Diagnosis not present

## 2019-09-02 DIAGNOSIS — K5900 Constipation, unspecified: Secondary | ICD-10-CM | POA: Diagnosis not present

## 2019-09-02 DIAGNOSIS — G8929 Other chronic pain: Secondary | ICD-10-CM | POA: Diagnosis not present

## 2019-09-02 NOTE — Patient Instructions (Signed)
Please start taking your Amitiza daily.

## 2019-09-03 NOTE — Progress Notes (Signed)
[name], MD [address]  [place]  [place], [place] [address]  Main: [phone]  Fax: [phone]   Primary Care Physician: [name], MD   Chief Complaint  Patient presents with  . Constipation    HPI: GREY SCHLAUCH is a 86 y.o. female here for follow up of constipation and Iron def anemia. On last visit the plan was to start amitiza and let her constipation improve and then proceed with colonoscopy for iron def anemia.  She has had poor prep on her colonoscopy twice now, therefore the plan was to improve her constipation prior to next prep.  However, patient has not been taking her Amitiza and still has 1 to 2 weeks without bowel movements.  Denies abdominal pain.  States she has not been taking it as she is dealing with shoulder pain for which she is seeing her pain management doctor.  No nausea or vomiting  Current Outpatient Medications  Medication Sig Dispense Refill  . acetaminophen (TYLENOL) 500 MG tablet Take 1 tablet (500 mg total) by mouth 3 (three) times daily. (Patient taking differently: Take 500 mg by mouth every 6 (six) hours as needed for moderate pain or headache. ) 90 tablet 0  . amLODipine (NORVASC) 10 MG tablet TAKE 1 TABLET BY MOUTH EVERY DAY 90 tablet 1  . AZOPT 1 % ophthalmic suspension Place 1 drop into both eyes in the morning and at bedtime.     . Baclofen 5 MG TABS Take 1 tablet by mouth daily. 30 tablet 1  . bimatoprost (LUMIGAN) 0.01 % SOLN Place 1 drop into both eyes at bedtime.     . brimonidine-timolol (COMBIGAN) 0.2-0.5 % ophthalmic solution Place 1 drop into both eyes 2 (two) times daily.     . Calcium Carbonate-Vitamin D (OYSTER SHELL CALCIUM 500 + D PO) Take 1 tablet by mouth 2 (two) times a day.    . denosumab (PROLIA) 60 MG/ML SOSY injection Inject into the skin.    . famotidine (PEPCID) 20 MG tablet TAKE 1 TABLET BY MOUTH EVERY DAY (Patient taking differently: Take 20 mg by mouth every morning. ) 90 tablet 1  .  ferrous sulfate 325 (65 FE) MG tablet TAKE 1 TABLET BY MOUTH EVERY DAY WITH BREAKFAST 90 tablet 1  . fluticasone (FLONASE) 50 MCG/ACT nasal spray SPRAY 2 SPRAYS INTO EACH NOSTRIL EVERY DAY 16 mL 0  . HYDROcodone-acetaminophen (NORCO/VICODIN) 5-325 MG tablet Take 1 tablet by mouth 3 (three) times daily as needed.    . lubiprostone (AMITIZA) 24 MCG capsule Take 1 capsule (24 mcg total) by mouth 2 (two) times daily with a meal. 60 capsule 3  . metFORMIN (GLUCOPHAGE-XR) 750 MG 24 hr tablet Take 1 tablet (750 mg total) by mouth daily with breakfast. 90 tablet 0  . olmesartan (BENICAR) 20 MG tablet Take 1 tablet (20 mg total) by mouth every morning. 90 tablet 1  . omega-3 acid ethyl esters (LOVAZA) 1 g capsule Take 1 capsule (1 g total) by mouth 2 (two) times daily. 180 capsule 0  . Omega-3 Fatty Acids (FISH OIL) 1000 MG CAPS Take 1 capsule by mouth daily.    [name] Delica Lancets 38S MISC USE AS DIRECTED 100 each 2  . ONETOUCH ULTRA test strip USE AS DIRECTED 100 strip 12  . oxyCODONE-acetaminophen (PERCOCET) 7.5-325 MG tablet Take 1 tablet by mouth every 8 (eight) hours as needed for moderate pain or severe pain. 90 tablet 0  . [START ON 09/16/2019]  oxyCODONE-acetaminophen (PERCOCET) 7.5-325 MG tablet Take 1 tablet by mouth every 8 (eight) hours as needed for moderate pain or severe pain. 90 tablet 0  . pravastatin (PRAVACHOL) 40 MG tablet Take 1 tablet (40 mg total) by mouth at bedtime. 90 tablet 1  . predniSONE (DELTASONE) 10 MG tablet Take 10 mg by mouth 2 (two) times daily.    [name] ROCKLATAN 0.02-0.005 % SOLN Apply 1 drop to eye at bedtime.    . Vitamin D, Ergocalciferol, (DRISDOL) 1.25 MG (50000 UNIT) CAPS capsule Take 50,000 Units by mouth every Wednesday.     No current facility-administered medications for this visit.    Allergies as of 09/02/2019 - Review Complete 09/02/2019  Allergen Reaction Noted  . Ace inhibitors Cough 04/03/2017    ROS:  General: Negative for anorexia, weight  loss, fever, chills, fatigue, weakness. ENT: Negative for hoarseness, difficulty swallowing , nasal congestion. CV: Negative for chest pain, angina, palpitations, dyspnea on exertion, peripheral edema.  Respiratory: Negative for dyspnea at rest, dyspnea on exertion, cough, sputum, wheezing.  GI: See history of present illness. GU:  Negative for dysuria, hematuria, urinary incontinence, urinary frequency, nocturnal urination.  Endo: Negative for unusual weight change.    Physical Examination:   BP (!) 166/83   Pulse 87   Temp 98.2 F (36.8 C) (Oral)   Wt 187 lb (84.8 kg)   BMI 35.33 kg/m   General: Well-nourished, well-developed in no acute distress.  Eyes: No icterus. Conjunctivae pink. Mouth: Oropharyngeal mucosa moist and pink , no lesions erythema or exudate. Neck: Supple, Trachea midline Abdomen: Bowel sounds are normal, nontender, nondistended, no hepatosplenomegaly or masses, no abdominal bruits or hernia , no rebound or guarding.   Extremities: No lower extremity edema. No clubbing or deformities. Neuro: Alert and oriented x 3.  Grossly intact. Skin: Warm and dry, no jaundice.   Psych: Alert and cooperative, normal mood and affect.   Labs: CMP     Component Value Date/Time   NA 140 04/17/2019 1512   NA 141 08/09/2015 0921   K 4.5 04/17/2019 1512   CL 109 04/17/2019 1512   CO2 21 04/17/2019 1512   GLUCOSE 114 (H) 04/17/2019 1512   BUN 22 04/17/2019 1512   BUN 22 08/09/2015 0921   CREATININE 1.21 (H) 04/17/2019 1512   CALCIUM 10.0 04/17/2019 1512   PROT 7.2 04/17/2019 1512   PROT 6.5 08/09/2015 0921   ALBUMIN 4.0 08/14/2016 1012   ALBUMIN 4.1 08/09/2015 0921   AST 11 04/17/2019 1512   ALT 9 04/17/2019 1512   ALKPHOS 56 08/14/2016 1012   BILITOT 0.3 04/17/2019 1512   BILITOT 0.3 08/09/2015 0921   GFRNONAA 41 (L) 04/17/2019 1512   GFRAA 48 (L) 04/17/2019 1512   Lab Results  Component Value Date   WBC 7.3 04/17/2019   HGB 12.1 04/17/2019   HCT 37.1  04/17/2019   MCV 92.1 04/17/2019   PLT 332 04/17/2019    Imaging Studies: No results found.  Assessment and Plan:   CHELSA STOUT is a 86 y.o. y/o female here for follow-up of constipation and iron deficiency anemia  Daughter present with the patient today We explained the importance of treating her constipation and then proceeding with colonoscopy for iron deficiency anemia.  Prior two colonoscopies had poor prep  Patient and then daughter agreed to start her Amitiza.  We will cancel her Colonoscopy as she is still constipated another colonoscopy at this time is likely to have poor prep again before constipation  is treated  Follow-up phone visit in 2 to 3 weeks and then reassess if colonoscopy can be done  Dr [name]

## 2019-09-07 ENCOUNTER — Ambulatory Visit: Payer: Self-pay | Admitting: *Deleted

## 2019-09-07 ENCOUNTER — Telehealth: Payer: Self-pay | Admitting: *Deleted

## 2019-09-07 ENCOUNTER — Other Ambulatory Visit: Payer: Medicare Other | Attending: Gastroenterology

## 2019-09-07 NOTE — Chronic Care Management (AMB) (Signed)
   Chronic Care Management   Unsuccessful Call Note 09/07/2019 Name: Taylor [name] MRN: [id number] DOB: 07-Aug-1932  Patient is a 86 year old female who sees Dr. Ancil Boozer for primary care. Dr. Ancil Boozer asked the CCM team to consult the patient for [facility].     This social worker was unable to reach patient via telephone today for follow up call regarding in home care. I have left HIPAA compliant voicemail asking patient to return my call. (unsuccessful outreach #1).   Plan: Will follow-up within 7 business days via telephone.     [name], [place] Administrator, arts Center/[facility] Care Management [phone]

## 2019-09-09 ENCOUNTER — Encounter: Admission: RE | Payer: Self-pay | Source: Home / Self Care

## 2019-09-09 ENCOUNTER — Ambulatory Visit: Admission: RE | Admit: 2019-09-09 | Payer: Medicare Other | Source: Home / Self Care | Admitting: Gastroenterology

## 2019-09-09 SURGERY — COLONOSCOPY WITH PROPOFOL
Anesthesia: General

## 2019-09-10 ENCOUNTER — Ambulatory Visit: Payer: Self-pay | Admitting: *Deleted

## 2019-09-10 ENCOUNTER — Telehealth: Payer: Self-pay | Admitting: *Deleted

## 2019-09-10 NOTE — Chronic Care Management (AMB) (Signed)
   Chronic Care Management   Unsuccessful Call Note 09/10/2019 Name: Taylor [name] MRN: [id number] DOB: 22-Sep-1932  Patient is a 86 year old female who sees [name], MD for primary care. Dr. Ignacia Bayley asked the CCM team to consult the patient for community resources.      This social worker was unable to reach patient via telephone today for follow up call. I have left HIPAA compliant voicemail asking patient to return my call. (unsuccessful outreach #2).   Plan: Will follow-up within 7 business days via telephone.     [name], [place] Administrator, arts Center[facility] Care Management [phone]

## 2019-09-17 ENCOUNTER — Telehealth: Payer: Self-pay | Admitting: *Deleted

## 2019-09-17 ENCOUNTER — Telehealth: Payer: Self-pay

## 2019-09-17 NOTE — Telephone Encounter (Signed)
   Chronic Care Management   Unsuccessful Call Note 09/17/2019 Name: Taylor [name] MRN: [id number] DOB: 26-Jul-1932  Patient is a 86 year old female who sees [name], MD for primary care. [name], MD asked the CCM team to consult the patient for community resources for in home care.    This social worker was unable to reach patient via telephone today for follow up call.  I have left HIPAA compliant voicemail asking patient to return my call. (unsuccessful outreach #3).   Plan: This Education officer, museum will make no additional phone calls to patient, however will be happy to engage patient upon her return call    [name], [place] Worker  [place] [facility] Care Management [phone]

## 2019-09-20 ENCOUNTER — Other Ambulatory Visit: Payer: Self-pay | Admitting: Family Medicine

## 2019-09-20 DIAGNOSIS — J3089 Other allergic rhinitis: Secondary | ICD-10-CM

## 2019-09-20 DIAGNOSIS — R790 Abnormal level of blood mineral: Secondary | ICD-10-CM

## 2019-09-20 DIAGNOSIS — D649 Anemia, unspecified: Secondary | ICD-10-CM

## 2019-09-20 NOTE — Telephone Encounter (Signed)
Requested Prescriptions  Pending Prescriptions Disp Refills  . fluticasone (FLONASE) 50 MCG/ACT nasal spray [Pharmacy Med Name: FLUTICASONE PROP 50 MCG SPRAY] 16 mL 0    Sig: SPRAY 2 SPRAYS INTO EACH NOSTRIL EVERY DAY     Ear, Nose, and Throat: Nasal Preparations - Corticosteroids Passed - 09/20/2019 12:33 PM      Passed - Valid encounter within last 12 months    Recent Outpatient Visits          1 month ago Type 2 diabetes mellitus with microalbuminuria, without long-term current use of insulin [facility])   [facility] [place], Drue Stager, MD   3 months ago Other iron deficiency anemia   [facility] [place], Drue Stager, MD   5 months ago Type 2 diabetes mellitus with microalbuminuria, without long-term current use of insulin [facility])   [facility] [place], Drue Stager, MD   9 months ago Type 2 diabetes mellitus with microalbuminuria, without long-term current use of insulin [facility])   [facility] [name], Drue Stager, MD   1 year ago Dyslipidemia associated with type 2 diabetes mellitus [facility])   [facility] [name], MD      Future Appointments            In 2 months Ancil Boozer, Drue Stager, MD [facility], [place]   In 7 months  [facility], [facility]

## 2019-09-20 NOTE — Telephone Encounter (Signed)
Requested Prescriptions  Pending Prescriptions Disp Refills  . ferrous sulfate 325 (65 FE) MG tablet [Pharmacy Med Name: FERROUS SULFATE 325 MG TABLET] 90 tablet 1    Sig: TAKE 1 TABLET BY MOUTH EVERY DAY WITH BREAKFAST     Endocrinology:  Minerals - Iron Supplementation Passed - 09/20/2019  4:54 PM      Passed - HGB in normal range and within 360 days    Hemoglobin  Date Value Ref Range Status  04/17/2019 12.1 11.7 - 15.5 g/dL Final         Passed - HCT in normal range and within 360 days    HCT  Date Value Ref Range Status  04/17/2019 37.1 35 - 45 % Final         Passed - RBC in normal range and within 360 days    RBC  Date Value Ref Range Status  04/17/2019 4.03 3.80 - 5.10 Million/uL Final         Passed - Fe (serum) in normal range and within 360 days    Iron  Date Value Ref Range Status  04/17/2019 83 45 - 160 mcg/dL Final   %SAT  Date Value Ref Range Status  04/17/2019 29 16 - 45 % (calc) Final         Passed - Ferritin in normal range and within 360 days    Ferritin  Date Value Ref Range Status  04/17/2019 31 16 - 288 ng/mL Final         Passed - Valid encounter within last 12 months    Recent Outpatient Visits          1 month ago Type 2 diabetes mellitus with microalbuminuria, without long-term current use of insulin [facility])   [facility] [place], Drue Stager, MD   3 months ago Other iron deficiency anemia   [facility] [place], Drue Stager, MD   5 months ago Type 2 diabetes mellitus with microalbuminuria, without long-term current use of insulin [facility])   [facility] [place], Drue Stager, MD   9 months ago Type 2 diabetes mellitus with microalbuminuria, without long-term current use of insulin [facility])   [facility] [place], Drue Stager, MD   1 year ago Dyslipidemia associated with type 2 diabetes mellitus [facility])   [facility] [name], MD      Future Appointments             In 2 months Ancil Boozer, Drue Stager, MD [facility], [place]   In 7 months  [facility], [facility]

## 2019-09-21 ENCOUNTER — Ambulatory Visit: Payer: Self-pay | Admitting: *Deleted

## 2019-09-21 DIAGNOSIS — M79675 Pain in left toe(s): Secondary | ICD-10-CM | POA: Diagnosis not present

## 2019-09-21 DIAGNOSIS — M545 Low back pain, unspecified: Secondary | ICD-10-CM

## 2019-09-21 DIAGNOSIS — B351 Tinea unguium: Secondary | ICD-10-CM | POA: Diagnosis not present

## 2019-09-21 DIAGNOSIS — M79674 Pain in right toe(s): Secondary | ICD-10-CM | POA: Diagnosis not present

## 2019-09-21 DIAGNOSIS — E1129 Type 2 diabetes mellitus with other diabetic kidney complication: Secondary | ICD-10-CM

## 2019-09-21 DIAGNOSIS — G8929 Other chronic pain: Secondary | ICD-10-CM

## 2019-09-21 NOTE — Patient Instructions (Addendum)
Thank you allowing the Chronic Care Management Team to be a part of your care! It was a pleasure speaking with you today!  1. Please call your provider regarding questions related to your medical concerns  CCM (Chronic Care Management) Team   Neldon Labella RN, BSN Nurse Care Coordinator  [phone]  [place], LCSW Clinical Social Worker [phone]  Goals Addressed              This Visit's Progress   .  "I need some help in the home set up" (pt-stated)        [place] (see longitudinal plan of care for additional care plan information)  Current Barriers:  . ADL IADL limitations  Clinical Social Work Clinical Goal(s):  [name] Over the next 90 days, patient will work with Administrator, Civil Service to address needs related to in home help  Interventions: . Return phone call from patient to follow up call to patient to discuss need for in home support . Patient discussed being  ill for the last month and has been staying with her daughter in [place]-extreme constipation reported . Patient further confirmed that her daughter is there but is only able to provide limited support . Patient's son is currently working on coordinating a private duty aid for patient, however for now will be staying with her daughter in [place], [place] . Discussed plans with patient for ongoing care management follow up and provided patient with direct contact information for care management team  Patient Self Care Activities:  . Patient verbalizes understanding of plan to discuss private duty aid options with her family before a decision is made . Unable to perform ADLs independently . Unable to perform IADLs independently  Please see past updates related to this goal by clicking on the "Past Updates" button in the selected goal          The patient verbalized understanding of instructions provided today and declined a print copy of patient instruction materials.   No further  follow up required: patient will contact her doctor to discuss concerns related to her medical conditon

## 2019-09-21 NOTE — Chronic Care Management (AMB) (Signed)
Chronic Care Management    Clinical Social Work Follow Up Note  09/21/2019 Name: Taylor [name] MRN: [id number] DOB: 1932-04-23  Taylor [name] is a 86 y.o. year old female who is a primary care patient of [name], MD. The CCM team was consulted for assistance with [employer] \.   Review of patient status, including review of consultants reports, other relevant assessments, and collaboration with appropriate care team members and the patient's provider was performed as part of comprehensive patient evaluation and provision of chronic care management services.    SDOH (Social Determinants of Health) assessments performed: No    Outpatient Encounter Medications as of 09/21/2019  Medication Sig  . acetaminophen (TYLENOL) 500 MG tablet Take 1 tablet (500 mg total) by mouth 3 (three) times daily. (Patient taking differently: Take 500 mg by mouth every 6 (six) hours as needed for moderate pain or headache. )  . amLODipine (NORVASC) 10 MG tablet TAKE 1 TABLET BY MOUTH EVERY DAY  . AZOPT 1 % ophthalmic suspension Place 1 drop into both eyes in the morning and at bedtime.   . bimatoprost (LUMIGAN) 0.01 % SOLN Place 1 drop into both eyes at bedtime.   . brimonidine-timolol (COMBIGAN) 0.2-0.5 % ophthalmic solution Place 1 drop into both eyes 2 (two) times daily.   . Calcium Carbonate-Vitamin D (OYSTER SHELL CALCIUM 500 + D PO) Take 1 tablet by mouth 2 (two) times a day.  . denosumab (PROLIA) 60 MG/ML SOSY injection Inject into the skin.  . famotidine (PEPCID) 20 MG tablet TAKE 1 TABLET BY MOUTH EVERY DAY (Patient taking differently: Take 20 mg by mouth every morning. )  . ferrous sulfate 325 (65 FE) MG tablet TAKE 1 TABLET BY MOUTH EVERY DAY WITH BREAKFAST  . fluticasone (FLONASE) 50 MCG/ACT nasal spray SPRAY 2 SPRAYS INTO EACH NOSTRIL EVERY DAY  . HYDROcodone-acetaminophen (NORCO/VICODIN) 5-325 MG tablet Take 1 tablet by mouth 3 (three) times daily as needed.  . lubiprostone (AMITIZA)  24 MCG capsule Take 1 capsule (24 mcg total) by mouth 2 (two) times daily with a meal.  . metFORMIN (GLUCOPHAGE-XR) 750 MG 24 hr tablet Take 1 tablet (750 mg total) by mouth daily with breakfast.  . olmesartan (BENICAR) 20 MG tablet Take 1 tablet (20 mg total) by mouth every morning.  [name] omega-3 acid ethyl esters (LOVAZA) 1 g capsule Take 1 capsule (1 g total) by mouth 2 (two) times daily.  . Omega-3 Fatty Acids (FISH OIL) 1000 MG CAPS Take 1 capsule by mouth daily.  [name] Delica Lancets 98X MISC USE AS DIRECTED  . ONETOUCH ULTRA test strip USE AS DIRECTED  . oxyCODONE-acetaminophen (PERCOCET) 7.5-325 MG tablet Take 1 tablet by mouth every 8 (eight) hours as needed for moderate pain or severe pain.  . pravastatin (PRAVACHOL) 40 MG tablet Take 1 tablet (40 mg total) by mouth at bedtime.  . predniSONE (DELTASONE) 10 MG tablet Take 10 mg by mouth 2 (two) times daily.  [name] ROCKLATAN 0.02-0.005 % SOLN Apply 1 drop to eye at bedtime.  . Vitamin D, Ergocalciferol, (DRISDOL) 1.25 MG (50000 UNIT) CAPS capsule Take 50,000 Units by mouth every Wednesday.   No facility-administered encounter medications on file as of 09/21/2019.     Goals Addressed              This Visit's Progress   .  "I need some help in the home set up" (pt-stated)        [place] (see  longitudinal plan of care for additional care plan information)  Current Barriers:  . ADL IADL limitations  Clinical Social Work Clinical Goal(s):  [name] Over the next 90 days, patient will work with Administrator, Civil Service to address needs related to in home help  Interventions: . Return phone call from patient to follow up call to patient to discuss need for in home support . Patient discussed being  ill for the last month and has been staying with her daughter in [place]-extreme constipation reported . Patient further confirmed that her daughter is there but is only able to provide limited support . Patient's son is currently  working on coordinating a private duty aid for patient, however for now will be staying with her daughter in [place], [place] . Discussed plans with patient for ongoing care management follow up and provided patient with direct contact information for care management team  Patient Self Care Activities:  . Patient verbalizes understanding of plan to discuss private duty aid options with her family before a decision is made . Unable to perform ADLs independently . Unable to perform IADLs independently  Please see past updates related to this goal by clicking on the "Past Updates" button in the selected goal          Follow Up Plan: Client will contact her doctor to discuss continued constipation   Taylor [name], [place] Worker  [place] [facility] Care Management [phone]

## 2019-10-05 ENCOUNTER — Ambulatory Visit: Payer: Medicare Other | Admitting: Surgery

## 2019-10-08 ENCOUNTER — Telehealth: Payer: Self-pay | Admitting: Pharmacist

## 2019-10-10 ENCOUNTER — Other Ambulatory Visit: Payer: Self-pay | Admitting: Family Medicine

## 2019-10-10 DIAGNOSIS — K219 Gastro-esophageal reflux disease without esophagitis: Secondary | ICD-10-CM

## 2019-10-10 NOTE — Telephone Encounter (Signed)
Requested Prescriptions  Pending Prescriptions Disp Refills  . famotidine (PEPCID) 20 MG tablet [Pharmacy Med Name: FAMOTIDINE 20 MG TABLET] 90 tablet 1    Sig: TAKE 1 TABLET BY MOUTH EVERY DAY     Gastroenterology:  H2 Antagonists Passed - 10/10/2019 12:45 AM      Passed - Valid encounter within last 12 months    Recent Outpatient Visits          1 month ago Type 2 diabetes mellitus with microalbuminuria, without long-term current use of insulin [facility])   [facility] [place], Drue Stager, MD   4 months ago Other iron deficiency anemia   [facility] [place], Drue Stager, MD   5 months ago Type 2 diabetes mellitus with microalbuminuria, without long-term current use of insulin [facility])   [facility] [place], Drue Stager, MD   9 months ago Type 2 diabetes mellitus with microalbuminuria, without long-term current use of insulin  Hospital)   [facility] [place], Drue Stager, MD   1 year ago Dyslipidemia associated with type 2 diabetes mellitus [facility])   [facility] [name], MD      Future Appointments            In 2 months Ancil Boozer, Drue Stager, MD [facility], [place]   In 6 months  [facility], [facility]

## 2019-10-12 ENCOUNTER — Ambulatory Visit: Payer: Medicare Other | Attending: Anesthesiology | Admitting: Anesthesiology

## 2019-10-12 ENCOUNTER — Encounter: Payer: Self-pay | Admitting: Anesthesiology

## 2019-10-12 ENCOUNTER — Other Ambulatory Visit: Payer: Self-pay

## 2019-10-12 DIAGNOSIS — Z79891 Long term (current) use of opiate analgesic: Secondary | ICD-10-CM

## 2019-10-12 DIAGNOSIS — M25561 Pain in right knee: Secondary | ICD-10-CM

## 2019-10-12 DIAGNOSIS — G894 Chronic pain syndrome: Secondary | ICD-10-CM | POA: Diagnosis not present

## 2019-10-12 DIAGNOSIS — G8929 Other chronic pain: Secondary | ICD-10-CM

## 2019-10-12 DIAGNOSIS — M545 Low back pain: Secondary | ICD-10-CM

## 2019-10-12 DIAGNOSIS — M79641 Pain in right hand: Secondary | ICD-10-CM | POA: Diagnosis not present

## 2019-10-12 DIAGNOSIS — M159 Polyosteoarthritis, unspecified: Secondary | ICD-10-CM

## 2019-10-12 DIAGNOSIS — M25562 Pain in left knee: Secondary | ICD-10-CM

## 2019-10-12 DIAGNOSIS — M79642 Pain in left hand: Secondary | ICD-10-CM

## 2019-10-12 MED ORDER — OXYCODONE-ACETAMINOPHEN 7.5-325 MG PO TABS: 1.0000 | ORAL_TABLET | Freq: Three times a day (TID) | ORAL | 0 refills | Status: AC | PRN

## 2019-10-12 MED ORDER — OXYCODONE-ACETAMINOPHEN 7.5-325 MG PO TABS: 1.0000 | ORAL_TABLET | Freq: Three times a day (TID) | ORAL | 0 refills | Status: DC | PRN

## 2019-10-12 NOTE — Progress Notes (Signed)
Virtual Visit via Telephone Note  I connected with Taylor [name] on 10/12/19 at  2:30 PM EDT by telephone and verified that I am speaking with the correct person using two identifiers.  Location: Patient: Home Provider: Pain control center   I discussed the limitations, risks, security and privacy concerns of performing an evaluation and management service by telephone and the availability of in person appointments. I also discussed with the patient that there may be a patient responsible charge related to this service. The patient expressed understanding and agreed to proceed.   History of Present Illness: I spoke to Taylor [name] via telephone today as we were unable to link up for the video portion of the virtual conference but she reports that she is doing reasonably well.  She had gone through a recent cycle where she had increasing hip knee and hand pain with some relatively new onset shoulder pain.  Since her last conversation this has abated.  The shoulder is much better.  This was evaluated by her family physicians and is no longer much of an issue.  In reference to her back pain which we are treating, this has been stable.  She is taking her oxycodone tablet 3 times a day and this is working well for her.  Based on our discussion today she continues to derive good functional lifestyle improvement with the medicine.  She sleeps better as well.  Pain during the day is under much better control than with conservative therapy alone.  Unfortunately she failed this but is functioning well with his current opioid scheduling  No change in the quality characteristic or distribution of her low back pain is reported.  Her bowel and bladder function has been good    Observations/Objective: [name] Current Outpatient Medications:  .  acetaminophen (TYLENOL) 500 MG tablet, Take 1 tablet (500 mg total) by mouth 3 (three) times daily. (Patient taking differently: Take 500 mg by mouth every 6 (six) hours as needed  for moderate pain or headache. ), Disp: 90 tablet, Rfl: 0 .  amLODipine (NORVASC) 10 MG tablet, TAKE 1 TABLET BY MOUTH EVERY DAY, Disp: 90 tablet, Rfl: 1 .  AZOPT 1 % ophthalmic suspension, Place 1 drop into both eyes in the morning and at bedtime. , Disp: , Rfl:  .  bimatoprost (LUMIGAN) 0.01 % SOLN, Place 1 drop into both eyes at bedtime. , Disp: , Rfl:  .  brimonidine-timolol (COMBIGAN) 0.2-0.5 % ophthalmic solution, Place 1 drop into both eyes 2 (two) times daily. , Disp: , Rfl:  .  Calcium Carbonate-Vitamin D (OYSTER SHELL CALCIUM 500 + D PO), Take 1 tablet by mouth 2 (two) times a day., Disp: , Rfl:  .  denosumab (PROLIA) 60 MG/ML SOSY injection, Inject into the skin., Disp: , Rfl:  .  famotidine (PEPCID) 20 MG tablet, TAKE 1 TABLET BY MOUTH EVERY DAY, Disp: 90 tablet, Rfl: 1 .  ferrous sulfate 325 (65 FE) MG tablet, TAKE 1 TABLET BY MOUTH EVERY DAY WITH BREAKFAST, Disp: 90 tablet, Rfl: 1 .  fluticasone (FLONASE) 50 MCG/ACT nasal spray, SPRAY 2 SPRAYS INTO EACH NOSTRIL EVERY DAY, Disp: 16 mL, Rfl: 0 .  HYDROcodone-acetaminophen (NORCO/VICODIN) 5-325 MG tablet, Take 1 tablet by mouth 3 (three) times daily as needed., Disp: , Rfl:  .  lubiprostone (AMITIZA) 24 MCG capsule, Take 1 capsule (24 mcg total) by mouth 2 (two) times daily with a meal., Disp: 60 capsule, Rfl: 3 .  metFORMIN (GLUCOPHAGE-XR) 750 MG 24 hr tablet,  Take 1 tablet (750 mg total) by mouth daily with breakfast., Disp: 90 tablet, Rfl: 0 .  olmesartan (BENICAR) 20 MG tablet, Take 1 tablet (20 mg total) by mouth every morning., Disp: 90 tablet, Rfl: 1 .  omega-3 acid ethyl esters (LOVAZA) 1 g capsule, Take 1 capsule (1 g total) by mouth 2 (two) times daily., Disp: 180 capsule, Rfl: 0 .  Omega-3 Fatty Acids (FISH OIL) 1000 MG CAPS, Take 1 capsule by mouth daily., Disp: , Rfl:  .  OneTouch Delica Lancets 94W MISC, USE AS DIRECTED, Disp: 100 each, Rfl: 2 .  ONETOUCH ULTRA test strip, USE AS DIRECTED, Disp: 100 strip, Rfl: 12 .  [START  ON 11/03/2019] oxyCODONE-acetaminophen (PERCOCET) 7.5-325 MG tablet, Take 1 tablet by mouth every 8 (eight) hours as needed for moderate pain or severe pain., Disp: 90 tablet, Rfl: 0 .  [START ON 12/03/2019] oxyCODONE-acetaminophen (PERCOCET) 7.5-325 MG tablet, Take 1 tablet by mouth every 8 (eight) hours as needed for moderate pain or severe pain., Disp: 90 tablet, Rfl: 0 .  pravastatin (PRAVACHOL) 40 MG tablet, Take 1 tablet (40 mg total) by mouth at bedtime., Disp: 90 tablet, Rfl: 1 .  predniSONE (DELTASONE) 10 MG tablet, Take 10 mg by mouth 2 (two) times daily., Disp: , Rfl:  .  ROCKLATAN 0.02-0.005 % SOLN, Apply 1 drop to eye at bedtime., Disp: , Rfl:  .  Vitamin D, Ergocalciferol, (DRISDOL) 1.25 MG (50000 UNIT) CAPS capsule, Take 50,000 Units by mouth every Wednesday., Disp: , Rfl:   Assessment and Plan: 1. Chronic pain syndrome   2. Chronic pain of right knee   3. Pain in both hands   4. Chronic midline low back pain without sciatica   5. Long term prescription opiate use   6. Chronic pain of both knees   7. Osteoarthritis of multiple joints, unspecified osteoarthritis type   Based on our discussion today I am going to refill her medications for September 14 and October 14.  I have reviewed the [facility] practitioner database information and it is appropriate.  We will schedule her for return to clinic in 2 months.  I have had a long discussion with her today regarding her Covid status.  She is currently unvaccinated and she is planning on presenting for a vaccine protocol.  I have encouraged her to continue follow-up with her primary care physicians for baseline medical care and return to clinic in 2 months.  Follow Up Instructions:    I discussed the assessment and treatment plan with the patient. The patient was provided an opportunity to ask questions and all were answered. The patient agreed with the plan and demonstrated an understanding of the instructions.   The patient was  advised to call back or seek an in-person evaluation if the symptoms worsen or if the condition fails to improve as anticipated.  I provided 30 minutes of non-face-to-face time during this encounter.   [name], MD

## 2019-10-14 ENCOUNTER — Other Ambulatory Visit: Payer: Self-pay | Admitting: Family Medicine

## 2019-10-14 DIAGNOSIS — J3089 Other allergic rhinitis: Secondary | ICD-10-CM

## 2019-10-20 ENCOUNTER — Other Ambulatory Visit: Payer: Self-pay

## 2019-10-20 ENCOUNTER — Ambulatory Visit
Admission: RE | Admit: 2019-10-20 | Discharge: 2019-10-20 | Disposition: A | Discharge status: Home / Self Care | Payer: Medicare Other | Source: Ambulatory Visit | Attending: Surgery | Admitting: Surgery

## 2019-10-20 DIAGNOSIS — Z1231 Encounter for screening mammogram for malignant neoplasm of breast: Secondary | ICD-10-CM

## 2019-10-21 DIAGNOSIS — E213 Hyperparathyroidism, unspecified: Secondary | ICD-10-CM | POA: Diagnosis not present

## 2019-10-21 DIAGNOSIS — M810 Age-related osteoporosis without current pathological fracture: Secondary | ICD-10-CM | POA: Diagnosis not present

## 2019-10-21 DIAGNOSIS — E559 Vitamin D deficiency, unspecified: Secondary | ICD-10-CM | POA: Diagnosis not present

## 2019-10-23 ENCOUNTER — Ambulatory Visit: Payer: Medicare Other | Admitting: Surgery

## 2019-10-30 ENCOUNTER — Other Ambulatory Visit: Payer: Self-pay

## 2019-10-30 ENCOUNTER — Encounter: Payer: Self-pay | Admitting: Surgery

## 2019-10-30 ENCOUNTER — Ambulatory Visit (INDEPENDENT_AMBULATORY_CARE_PROVIDER_SITE_OTHER): Payer: Medicare Other | Admitting: Surgery

## 2019-10-30 VITALS — BP 185/89 | HR 84 | Temp 98.7°F | Resp 12 | Ht 61.0 in | Wt 188.0 lb

## 2019-10-30 DIAGNOSIS — N6310 Unspecified lump in the right breast, unspecified quadrant: Secondary | ICD-10-CM

## 2019-10-30 NOTE — Patient Instructions (Signed)
Follow-up with our office as needed.  You may have your mammograms scheduled through you primary care provider next year.  Continue self breast exams. Call office for any new breast issues or concerns.   Breast Self-Awareness Breast self-awareness means being familiar with how your breasts look and feel. It involves checking your breasts regularly and reporting any changes to your health care provider. Practicing breast self-awareness is important. Sometimes changes may not be harmful (are benign), but sometimes a change in your breasts can be a sign of a serious medical problem. It is important to learn how to do this procedure correctly so that you can catch problems early, when treatment is more likely to be successful. All women should practice breast self-awareness, including women who have had breast implants. What you need:  A mirror.  A well-lit room. How to do a breast self-exam A breast self-exam is one way to learn what is normal for your breasts and whether your breasts are changing. To do a breast self-exam: Look for changes  1. Remove all the clothing above your waist. 2. Stand in front of a mirror in a room with good lighting. 3. Put your hands on your hips. 4. Push your hands firmly downward. 5. Compare your breasts in the mirror. Look for differences between them (asymmetry), such as: ? Differences in shape. ? Differences in size. ? Puckers, dips, and bumps in one breast and not the other. 6. Look at each breast for changes in the skin, such as: ? Redness. ? Scaly areas. 7. Look for changes in your nipples, such as: ? Discharge. ? Bleeding. ? Dimpling. ? Redness. ? A change in position. Feel for changes Carefully feel your breasts for lumps and changes. It is best to do this while lying on your back on the floor, and again while sitting or standing in the tub or shower with soapy water on your skin. Feel each breast in the following way: 1. Place the arm on the  side of the breast you are examining above your head. 2. Feel your breast with the other hand. 3. Start in the nipple area and make -inch (2 cm) overlapping circles to feel your breast. Use the pads of your three middle fingers to do this. Apply light pressure, then medium pressure, then firm pressure. The light pressure will allow you to feel the tissue closest to the skin. The medium pressure will allow you to feel the tissue that is a little deeper. The firm pressure will allow you to feel the tissue close to the ribs. 4. Continue the overlapping circles, moving downward over the breast until you feel your ribs below your breast. 5. Move one finger-width toward the center of the body. Continue to use the -inch (2 cm) overlapping circles to feel your breast as you move slowly up toward your collarbone. 6. Continue the up-and-down exam using all three pressures until you reach your armpit.  Write down what you find Writing down what you find can help you remember what to discuss with your health care provider. Write down:  What is normal for each breast.  Any changes that you find in each breast, including: ? The kind of changes you find. ? Any pain or tenderness. ? Size and location of any lumps.  Where you are in your menstrual cycle, if you are still menstruating. General tips and recommendations  Examine your breasts every month.  If you are breastfeeding, the best time to examine your breasts is  after a feeding or after using a breast pump.  If you menstruate, the best time to examine your breasts is 5-7 days after your period. Breasts are generally lumpier during menstrual periods, and it may be more difficult to notice changes.  With time and practice, you will become more familiar with the variations in your breasts and more comfortable with the exam. Contact a health care provider if you:  See a change in the shape or size of your breasts or nipples.  See a change in the  skin of your breast or nipples, such as a reddened or scaly area.  Have unusual discharge from your nipples.  Find a lump or thick area that was not there before.  Have pain in your breasts.  Have any concerns related to your breast health. Summary  Breast self-awareness includes looking for physical changes in your breasts, as well as feeling for any changes within your breasts.  Breast self-awareness should be performed in front of a mirror in a well-lit room.  You should examine your breasts every month. If you menstruate, the best time to examine your breasts is 5-7 days after your menstrual period.  Let your health care provider know of any changes you notice in your breasts, including changes in size, changes on the skin, pain or tenderness, or unusual fluid from your nipples. This information is not intended to replace advice given to you by your health care provider. Make sure you discuss any questions you have with your health care provider. Document Revised: 09/24/2017 Document Reviewed: 09/24/2017 Elsevier Patient Education  [place].

## 2019-10-30 NOTE — Progress Notes (Signed)
10/30/2019  History of Present Illness: Taylor [name] is a 86 y.o. female s/p right breast lumpectomy for breast mass, with pathology showing fibroadenoma, on 05/12/19.  She presents today for follow up.  She had her yearly mammogram on 10/20/19 which did not show any suspicious findings.  She reports doing well, without any pain, palpable masses, or skin changes.  Past Medical History: Past Medical History:  Diagnosis Date  . Abnormal mammogram, unspecified 2013  . Anemia   . Arthritis   . Breast screening, unspecified 2013  . Diabetes mellitus without complication ([place]) 4481   non insulin dependent  . Dysrhythmia    IRREGULAR HEART BEAT  . GERD (gastroesophageal reflux disease)   . Glaucoma 2003  . Gout   . History of kidney stones    H/O  . Hyperlipidemia 2008  . Hypertension 1980's  . Lump or mass in breast 01/03/2012   left breast  . Obesity, unspecified 2013  . Osteoporosis   . Shingles 2013  . Special screening for malignant neoplasms, colon 2013     Past Surgical History: Past Surgical History:  Procedure Laterality Date  . ABDOMINAL HYSTERECTOMY  1958   menorrhagia  . BACK SURGERY    . BREAST BIOPSY Left 01/25/2012  . BREAST BIOPSY Right 04/08/2019   hear clip, [place] bx, path pending  . BREAST EXCISIONAL BIOPSY    . BREAST LUMPECTOMY WITH NEEDLE LOCALIZATION Right 05/12/2019   Procedure: BREAST LUMPECTOMY WITH NEEDLE LOCALIZATION;  Surgeon: Olean Ree, MD;  Location: [facility] ORS;  Service: General;  Laterality: Right;  . CATARACT EXTRACTION, BILATERAL Bilateral    1st 06/07/15 and the 2nd 06/21/15  . CHOLECYSTECTOMY    . COLONOSCOPY  2003   [facility]  . COLONOSCOPY WITH PROPOFOL N/A 07/02/2019   Procedure: COLONOSCOPY WITH PROPOFOL;  Surgeon: Virgel Manifold, MD;  Location: [facility] ENDOSCOPY;  Service: Endoscopy;  Laterality: N/A;  . COLONOSCOPY WITH PROPOFOL N/A 07/16/2019   Procedure: COLONOSCOPY WITH PROPOFOL;  Surgeon: Virgel Manifold, MD;  Location: [facility]  ENDOSCOPY;  Service: Endoscopy;  Laterality: N/A;  . ESOPHAGOGASTRODUODENOSCOPY (EGD) WITH PROPOFOL N/A 07/02/2019   Procedure: ESOPHAGOGASTRODUODENOSCOPY (EGD) WITH PROPOFOL;  Surgeon: Virgel Manifold, MD;  Location: [facility] ENDOSCOPY;  Service: Endoscopy;  Laterality: N/A;  . EYE SURGERY    . KNEE SURGERY  2009,2011,2012   twice on right and once on left  . KNEE SURGERY    . LIPOMA EXCISION  1998  . [place]  2004  . TONSILLECTOMY     age of 83    Home Medications: Prior to Admission medications   Medication Sig Start Date End Date Taking? Authorizing Provider  acetaminophen (TYLENOL) 500 MG tablet Take 1 tablet (500 mg total) by mouth 3 (three) times daily. Patient taking differently: Take 500 mg by mouth every 6 (six) hours as needed for moderate pain or headache.  12/12/15  Yes Sowles, Drue Stager, MD  amLODipine (NORVASC) 10 MG tablet TAKE 1 TABLET BY MOUTH EVERY DAY 07/18/19  Yes Sowles, Drue Stager, MD  AZOPT 1 % ophthalmic suspension Place 1 drop into both eyes in the morning and at bedtime.  06/02/18  Yes [provider]  bimatoprost (LUMIGAN) 0.01 % SOLN Place 1 drop into both eyes at bedtime.    Yes [provider]  brimonidine-timolol (COMBIGAN) 0.2-0.5 % ophthalmic solution Place 1 drop into both eyes 2 (two) times daily.    Yes [provider]  Calcium Carbonate-Vitamin D (OYSTER SHELL CALCIUM 500 + D PO)  Take 1 tablet by mouth 2 (two) times a day.   Yes [provider]  denosumab (PROLIA) 60 MG/ML SOSY injection Inject into the skin. 05/25/19  Yes [provider]  famotidine (PEPCID) 20 MG tablet TAKE 1 TABLET BY MOUTH EVERY DAY 10/10/19  Yes Sowles, Drue Stager, MD  ferrous sulfate 325 (65 FE) MG tablet TAKE 1 TABLET BY MOUTH EVERY DAY WITH BREAKFAST 09/20/19  Yes Sowles, Drue Stager, MD  fluticasone (FLONASE) 50 MCG/ACT nasal spray SPRAY 2 SPRAYS INTO EACH NOSTRIL EVERY DAY 10/14/19  Yes Sowles, Drue Stager, MD  HYDROcodone-acetaminophen  (NORCO/VICODIN) 5-325 MG tablet Take 1 tablet by mouth 3 (three) times daily as needed. 08/10/19  Yes [provider]  lubiprostone (AMITIZA) 24 MCG capsule Take 1 capsule (24 mcg total) by mouth 2 (two) times daily with a meal. 08/12/19  Yes [name] B, MD  metFORMIN (GLUCOPHAGE-XR) 750 MG 24 hr tablet Take 1 tablet (750 mg total) by mouth daily with breakfast. 08/14/19  Yes Sowles, Drue Stager, MD  olmesartan (BENICAR) 20 MG tablet Take 1 tablet (20 mg total) by mouth every morning. 08/14/19  Yes Sowles, Drue Stager, MD  omega-3 acid ethyl esters (LOVAZA) 1 g capsule Take 1 capsule (1 g total) by mouth 2 (two) times daily. 08/14/19  Yes Sowles, Drue Stager, MD  Omega-3 Fatty Acids (FISH OIL) 1000 MG CAPS Take 1 capsule by mouth daily. 12/31/18  Yes [provider]  OneTouch Delica Lancets 85O MISC USE AS DIRECTED 05/14/19  Yes Sowles, Drue Stager, MD  [facility] ULTRA test strip USE AS DIRECTED 04/14/19  Yes Ancil Boozer, Drue Stager, MD  oxyCODONE-acetaminophen (PERCOCET) 7.5-325 MG tablet Take 1 tablet by mouth every 8 (eight) hours as needed for moderate pain or severe pain. 11/03/19 12/03/19 Yes [name], MD  oxyCODONE-acetaminophen (PERCOCET) 7.5-325 MG tablet Take 1 tablet by mouth every 8 (eight) hours as needed for moderate pain or severe pain. 12/03/19 01/02/20 Yes [name], MD  pravastatin (PRAVACHOL) 40 MG tablet Take 1 tablet (40 mg total) by mouth at bedtime. 08/14/19  Yes Sowles, Drue Stager, MD  predniSONE (DELTASONE) 10 MG tablet Take 10 mg by mouth 2 (two) times daily. 08/10/19  Yes [provider]  ROCKLATAN 0.02-0.005 % SOLN Apply 1 drop to eye at bedtime. 08/18/19  Yes [provider]  Vitamin D, Ergocalciferol, (DRISDOL) 1.25 MG (50000 UNIT) CAPS capsule Take 50,000 Units by mouth every Wednesday. 04/17/19  Yes [provider]    Allergies: Allergies  Allergen Reactions  . Ace Inhibitors Cough    Review of Systems: Review of Systems   Constitutional: Negative for chills and fever.  Respiratory: Negative for shortness of breath.   Cardiovascular: Negative for chest pain.  Gastrointestinal: Negative for abdominal pain, nausea and vomiting.  Skin: Negative for rash.    Physical Exam BP (!) 185/89   Pulse 84   Temp 98.7 F (37.1 C) (Oral)   Resp 12   Ht 5\' 1"  (1.549 m)   Wt 188 lb (85.3 kg)   SpO2 98%   BMI 35.52 kg/m  CONSTITUTIONAL: No acute distress HEENT:  Normocephalic, atraumatic, extraocular motion intact. RESPIRATORY:  Lungs are clear, and breath sounds are equal bilaterally. Normal respiratory effort without pathologic use of accessory muscles. CARDIOVASCULAR: Heart is regular without murmurs, gallops, or rubs. BREAST:  Right breast s/p lumpectomy in outer lower quadrant.  Incision well healed.  No palpable masses, skin changes, or nipple changes.  Left breast without any palpable masses, skin changes, or nipple changes.  No  axillary lymphadenopathy bilaterally. NEUROLOGIC:  Motor and sensation is grossly normal.  Cranial nerves are grossly intact. PSYCH:  Alert and oriented to person, place and time. Affect is normal.  Labs/Imaging: Mammogram 10/20/19: FINDINGS: Interval postsurgical scarring in the posterior outer right breast. There are no findings suspicious for malignancy. Images were processed with CAD.  IMPRESSION: No mammographic evidence of malignancy. A result letter of this screening mammogram will be mailed directly to the patient.  RECOMMENDATION: Screening mammogram in one year. (Code:SM-B-01Y)  BI-RADS CATEGORY  2: Benign.  Assessment and Plan: This is a 86 y.o. female s/p right breast lumpectomy.  --Patient is doing well, with mammogram reassuring and exam benign. --Discussed with her that given the benign pathology and mammogram, she can continue screening mammograms with her PCP going forwards.  We're always available if anything is needed in the future. --Follow up  prn.  Face-to-face time spent with the patient and care providers was 15 minutes, with more than 50% of the time spent counseling, educating, and coordinating care of the patient.     Melvyn Neth, [facility]

## 2019-11-03 DIAGNOSIS — E1122 Type 2 diabetes mellitus with diabetic chronic kidney disease: Secondary | ICD-10-CM | POA: Diagnosis not present

## 2019-11-03 DIAGNOSIS — N2581 Secondary hyperparathyroidism of renal origin: Secondary | ICD-10-CM | POA: Diagnosis not present

## 2019-11-03 DIAGNOSIS — N1831 Chronic kidney disease, stage 3a: Secondary | ICD-10-CM | POA: Diagnosis not present

## 2019-11-03 DIAGNOSIS — I10 Essential (primary) hypertension: Secondary | ICD-10-CM | POA: Diagnosis not present

## 2019-11-11 ENCOUNTER — Other Ambulatory Visit: Payer: Self-pay | Admitting: Family Medicine

## 2019-11-11 DIAGNOSIS — J3089 Other allergic rhinitis: Secondary | ICD-10-CM

## 2019-11-11 NOTE — Telephone Encounter (Signed)
Requested Prescriptions  Pending Prescriptions Disp Refills  . fluticasone (FLONASE) 50 MCG/ACT nasal spray [Pharmacy Med Name: FLUTICASONE PROP 50 MCG SPRAY] 16 mL 0    Sig: SPRAY 2 SPRAYS INTO EACH NOSTRIL EVERY DAY     Ear, Nose, and Throat: Nasal Preparations - Corticosteroids Passed - 11/11/2019 11:33 AM      Passed - Valid encounter within last 12 months    Recent Outpatient Visits          2 months ago Type 2 diabetes mellitus with microalbuminuria, without long-term current use of insulin [facility])   [facility] [place], Drue Stager, MD   5 months ago Other iron deficiency anemia   [facility] [place], Drue Stager, MD   6 months ago Type 2 diabetes mellitus with microalbuminuria, without long-term current use of insulin [facility])   [facility] [place], Drue Stager, MD   11 months ago Type 2 diabetes mellitus with microalbuminuria, without long-term current use of insulin [facility])   [facility] [place], Drue Stager, MD   1 year ago Dyslipidemia associated with type 2 diabetes mellitus [facility])   [facility] [name], MD      Future Appointments            In 3 weeks Andree Elk, Alvina Filbert, MD [place]   In 1 month Ancil Boozer, Drue Stager, MD [facility], [place]   In 5 months  [place]

## 2019-11-12 ENCOUNTER — Telehealth: Payer: Self-pay

## 2019-11-13 NOTE — Progress Notes (Signed)
 .   Current antihyperglycemic regimen:  o Cereal or peanut butter cracers . What recent interventions/DTPs have been made to improve glycemic control:  o none . Have there been any recent hospitalizations or ED visits since last visit with CPP? Yes . Patient denies hypoglycemic symptoms . Patient denies hyperglycemic symptoms . How often are you checking your blood sugar?infrequently . What are your blood sugars ranging?  o Fasting: 100 o Before meals:  o After meals:128 o Bedtime: 150 . During the week, how often does your blood glucose drop below 70? patient does not check sugar regularly but denies hypoglycemic symptoms . Are you checking your feet daily/regularly? no  Adherence Review: Is the patient currently on a STATIN medication? Yes Is the patient currently on ACE/ARB medication? No Does the patient have >5 day gap between last estimated fill dates? Yes for Azopt 1% ophthalmic suspension   Doristine Counter, CMA

## 2019-11-18 DIAGNOSIS — H401113 Primary open-angle glaucoma, right eye, severe stage: Secondary | ICD-10-CM | POA: Diagnosis not present

## 2019-11-18 DIAGNOSIS — H35372 Puckering of macula, left eye: Secondary | ICD-10-CM | POA: Diagnosis not present

## 2019-11-18 DIAGNOSIS — Z961 Presence of intraocular lens: Secondary | ICD-10-CM | POA: Diagnosis not present

## 2019-11-18 DIAGNOSIS — H401122 Primary open-angle glaucoma, left eye, moderate stage: Secondary | ICD-10-CM | POA: Diagnosis not present

## 2019-11-19 ENCOUNTER — Telehealth: Payer: Self-pay

## 2019-11-19 NOTE — Progress Notes (Signed)
Reviewed chart and adherence measures. Per insurance data, no adherence data available.

## 2019-11-20 ENCOUNTER — Other Ambulatory Visit: Payer: Self-pay

## 2019-11-20 NOTE — Telephone Encounter (Signed)
Patient called and wanted a new type of needle she did not specify what she wanted but left a lot number of the type of needle needed. Called pharmacy but they could not help. Tried to call patient back to get more information. No answer no vm.

## 2019-11-23 MED ORDER — ONETOUCH DELICA LANCETS 30G MISC
11 refills | Status: DC
Start: 2019-11-23 — End: 2019-12-14

## 2019-11-23 NOTE — Addendum Note (Signed)
Addended by: [name] F on: 11/23/2019 02:11 PM   Modules accepted: Orders

## 2019-11-23 NOTE — Addendum Note (Signed)
Addended by: Chilton Greathouse on: 11/23/2019 11:15 AM   Modules accepted: Orders

## 2019-11-23 NOTE — Telephone Encounter (Signed)
Patient called to request Rx for OneTouch Ultra 2 lancets sent to the pharmacy please ASAP cant check her blood sugar because she is all out  Ph# [phone]

## 2019-12-04 ENCOUNTER — Ambulatory Visit: Payer: Medicare Other | Attending: Anesthesiology | Admitting: Anesthesiology

## 2019-12-04 ENCOUNTER — Other Ambulatory Visit: Payer: Self-pay

## 2019-12-04 ENCOUNTER — Other Ambulatory Visit: Payer: Self-pay | Admitting: Family Medicine

## 2019-12-04 ENCOUNTER — Encounter: Payer: Self-pay | Admitting: Anesthesiology

## 2019-12-04 DIAGNOSIS — J3089 Other allergic rhinitis: Secondary | ICD-10-CM

## 2019-12-04 DIAGNOSIS — M545 Low back pain, unspecified: Secondary | ICD-10-CM | POA: Diagnosis not present

## 2019-12-04 DIAGNOSIS — M25561 Pain in right knee: Secondary | ICD-10-CM

## 2019-12-04 DIAGNOSIS — M159 Polyosteoarthritis, unspecified: Secondary | ICD-10-CM

## 2019-12-04 DIAGNOSIS — G894 Chronic pain syndrome: Secondary | ICD-10-CM

## 2019-12-04 DIAGNOSIS — Z79891 Long term (current) use of opiate analgesic: Secondary | ICD-10-CM

## 2019-12-04 DIAGNOSIS — M79641 Pain in right hand: Secondary | ICD-10-CM | POA: Diagnosis not present

## 2019-12-04 DIAGNOSIS — M25562 Pain in left knee: Secondary | ICD-10-CM

## 2019-12-04 DIAGNOSIS — M79642 Pain in left hand: Secondary | ICD-10-CM

## 2019-12-04 DIAGNOSIS — G8929 Other chronic pain: Secondary | ICD-10-CM

## 2019-12-04 MED ORDER — OXYCODONE-ACETAMINOPHEN 7.5-325 MG PO TABS
1.0000 | ORAL_TABLET | Freq: Three times a day (TID) | ORAL | 0 refills | Status: DC | PRN
Start: 2020-01-02 — End: 2020-01-04

## 2019-12-04 MED ORDER — OXYCODONE-ACETAMINOPHEN 7.5-325 MG PO TABS: 1.0000 | ORAL_TABLET | Freq: Three times a day (TID) | ORAL | 0 refills | Status: DC | PRN

## 2019-12-08 ENCOUNTER — Other Ambulatory Visit: Payer: Self-pay | Admitting: Family Medicine

## 2019-12-08 ENCOUNTER — Other Ambulatory Visit: Payer: Self-pay | Admitting: Gastroenterology

## 2019-12-08 DIAGNOSIS — J3089 Other allergic rhinitis: Secondary | ICD-10-CM

## 2019-12-08 NOTE — Telephone Encounter (Signed)
   Notes to clinic:  Patient requesting a 90 day supply Please advise for 90 day    Requested Prescriptions  Pending Prescriptions Disp Refills   fluticasone (FLONASE) 50 MCG/ACT nasal spray [Pharmacy Med Name: FLUTICASONE PROP 50 MCG SPRAY] 48 mL 1    Sig: SPRAY 2 SPRAYS INTO EACH NOSTRIL EVERY DAY      Ear, Nose, and Throat: Nasal Preparations - Corticosteroids Passed - 12/08/2019  9:17 AM      Passed - Valid encounter within last 12 months    Recent Outpatient Visits           3 months ago Type 2 diabetes mellitus with microalbuminuria, without long-term current use of insulin [facility])   [facility] [place], Drue Stager, MD   6 months ago Other iron deficiency anemia   [facility] [place], Drue Stager, MD   7 months ago Type 2 diabetes mellitus with microalbuminuria, without long-term current use of insulin [facility])   [facility] [place], Drue Stager, MD   11 months ago Type 2 diabetes mellitus with microalbuminuria, without long-term current use of insulin [facility])   [facility] [place], Drue Stager, MD   1 year ago Dyslipidemia associated with type 2 diabetes mellitus [facility])   [facility] [name], MD       Future Appointments             In 6 days [name], MD [facility], [place]   In 4 months  [facility], [facility]

## 2019-12-08 NOTE — Progress Notes (Signed)
Virtual Visit via Telephone Note  I connected with Taylor [name] on 12/08/19 at  1:00 PM EDT by telephone and verified that I am speaking with the correct person using two identifiers.  Location: Patient: Home Provider: [facility] center   I discussed the limitations, risks, security and privacy concerns of performing an evaluation and management service by telephone and the availability of in person appointments. I also discussed with the patient that there may be a patient responsible charge related to this service. The patient expressed understanding and agreed to proceed.   History of Present Illness: I spoke with Taylor [name] today via telephone as she was unable to do the video portion of the virtual conference.  She reports that her low back pain and lower extremity pain is stable in nature.  No changes in the symptom quality characteristic or distribution reported at this time.  She is staying active to the best of her ability.  She is taking her medications as prescribed.  She continues to derive good functional lifestyle improvement with her medicines and no side effects reported at this time.  The primary pain that she is reporting is a chronic aching dull low back pain with radiation into the lower extremities when she is more active.  Her bowel bladder function has been stable with no changes in lower extremity strength or function   Observations/Objective: [name] Current Outpatient Medications:  .  acetaminophen (TYLENOL) 500 MG tablet, Take 1 tablet (500 mg total) by mouth 3 (three) times daily. (Patient taking differently: Take 500 mg by mouth every 6 (six) hours as needed for moderate pain or headache. ), Disp: 90 tablet, Rfl: 0 .  amLODipine (NORVASC) 10 MG tablet, TAKE 1 TABLET BY MOUTH EVERY DAY, Disp: 90 tablet, Rfl: 1 .  AZOPT 1 % ophthalmic suspension, Place 1 drop into both eyes in the morning and at bedtime. , Disp: , Rfl:  .  bimatoprost (LUMIGAN) 0.01 % SOLN, Place 1 drop  into both eyes at bedtime. , Disp: , Rfl:  .  brimonidine-timolol (COMBIGAN) 0.2-0.5 % ophthalmic solution, Place 1 drop into both eyes 2 (two) times daily. , Disp: , Rfl:  .  Calcium Carbonate-Vitamin D (OYSTER SHELL CALCIUM 500 + D PO), Take 1 tablet by mouth 2 (two) times a day., Disp: , Rfl:  .  denosumab (PROLIA) 60 MG/ML SOSY injection, Inject into the skin., Disp: , Rfl:  .  famotidine (PEPCID) 20 MG tablet, TAKE 1 TABLET BY MOUTH EVERY DAY, Disp: 90 tablet, Rfl: 1 .  ferrous sulfate 325 (65 FE) MG tablet, TAKE 1 TABLET BY MOUTH EVERY DAY WITH BREAKFAST, Disp: 90 tablet, Rfl: 1 .  fluticasone (FLONASE) 50 MCG/ACT nasal spray, SPRAY 2 SPRAYS INTO EACH NOSTRIL EVERY DAY, Disp: 16 mL, Rfl: 0 .  HYDROcodone-acetaminophen (NORCO/VICODIN) 5-325 MG tablet, Take 1 tablet by mouth 3 (three) times daily as needed., Disp: , Rfl:  .  lubiprostone (AMITIZA) 24 MCG capsule, Take 1 capsule (24 mcg total) by mouth 2 (two) times daily with a meal., Disp: 60 capsule, Rfl: 3 .  metFORMIN (GLUCOPHAGE-XR) 750 MG 24 hr tablet, Take 1 tablet (750 mg total) by mouth daily with breakfast., Disp: 90 tablet, Rfl: 0 .  olmesartan (BENICAR) 20 MG tablet, Take 1 tablet (20 mg total) by mouth every morning., Disp: 90 tablet, Rfl: 1 .  omega-3 acid ethyl esters (LOVAZA) 1 g capsule, Take 1 capsule (1 g total) by mouth 2 (two) times daily., Disp: 180 capsule,  Rfl: 0 .  Omega-3 Fatty Acids (FISH OIL) 1000 MG CAPS, Take 1 capsule by mouth daily., Disp: , Rfl:  .  OneTouch Delica Lancets 48G MISC, USE AS DIRECTED, Disp: 100 each, Rfl: 11 .  ONETOUCH ULTRA test strip, USE AS DIRECTED, Disp: 100 strip, Rfl: 12 .  [START ON 01/02/2020] oxyCODONE-acetaminophen (PERCOCET) 7.5-325 MG tablet, Take 1 tablet by mouth every 8 (eight) hours as needed for moderate pain or severe pain., Disp: 90 tablet, Rfl: 0 .  [START ON 02/01/2020] oxyCODONE-acetaminophen (PERCOCET) 7.5-325 MG tablet, Take 1 tablet by mouth every 8 (eight) hours as needed  for moderate pain or severe pain., Disp: 90 tablet, Rfl: 0 .  pravastatin (PRAVACHOL) 40 MG tablet, Take 1 tablet (40 mg total) by mouth at bedtime., Disp: 90 tablet, Rfl: 1 .  predniSONE (DELTASONE) 10 MG tablet, Take 10 mg by mouth 2 (two) times daily., Disp: , Rfl:  .  ROCKLATAN 0.02-0.005 % SOLN, Apply 1 drop to eye at bedtime., Disp: , Rfl:  .  Vitamin D, Ergocalciferol, (DRISDOL) 1.25 MG (50000 UNIT) CAPS capsule, Take 50,000 Units by mouth every Wednesday., Disp: , Rfl:   Assessment and Plan: 1. Chronic pain syndrome   2. Chronic pain of right knee   3. Pain in both hands   4. Chronic midline low back pain without sciatica   5. Long term prescription opiate use   6. Chronic pain of both knees   7. Osteoarthritis of multiple joints, unspecified osteoarthritis type   Based on our discussion today and upon review of the [facility] practitioner database information going to refill her medications for the next 2 months.  Be dated for October 14 which is already present in addition to November 13 and December 13.  We will schedule her for return to clinic in 2 months.  I want her to continue follow-up with her primary care physicians.  No other changes in her pain management will be initiated today.  Follow Up Instructions:    I discussed the assessment and treatment plan with the patient. The patient was provided an opportunity to ask questions and all were answered. The patient agreed with the plan and demonstrated an understanding of the instructions.   The patient was advised to call back or seek an in-person evaluation if the symptoms worsen or if the condition fails to improve as anticipated.  I provided 30 minutes of non-face-to-face time during this encounter.   [name], MD

## 2019-12-14 ENCOUNTER — Ambulatory Visit (INDEPENDENT_AMBULATORY_CARE_PROVIDER_SITE_OTHER): Payer: Medicare Other | Admitting: Family Medicine

## 2019-12-14 ENCOUNTER — Encounter: Payer: Self-pay | Admitting: Family Medicine

## 2019-12-14 ENCOUNTER — Other Ambulatory Visit: Payer: Self-pay

## 2019-12-14 VITALS — BP 130/58 | HR 61 | Temp 98.2°F | Resp 16 | Ht 61.0 in | Wt 183.9 lb

## 2019-12-14 DIAGNOSIS — N1831 Chronic kidney disease, stage 3a: Secondary | ICD-10-CM

## 2019-12-14 DIAGNOSIS — I10 Essential (primary) hypertension: Secondary | ICD-10-CM

## 2019-12-14 DIAGNOSIS — J3089 Other allergic rhinitis: Secondary | ICD-10-CM

## 2019-12-14 DIAGNOSIS — R809 Proteinuria, unspecified: Secondary | ICD-10-CM | POA: Diagnosis not present

## 2019-12-14 DIAGNOSIS — E1169 Type 2 diabetes mellitus with other specified complication: Secondary | ICD-10-CM

## 2019-12-14 DIAGNOSIS — M810 Age-related osteoporosis without current pathological fracture: Secondary | ICD-10-CM

## 2019-12-14 DIAGNOSIS — R269 Unspecified abnormalities of gait and mobility: Secondary | ICD-10-CM | POA: Diagnosis not present

## 2019-12-14 DIAGNOSIS — M545 Low back pain, unspecified: Secondary | ICD-10-CM | POA: Diagnosis not present

## 2019-12-14 DIAGNOSIS — E213 Hyperparathyroidism, unspecified: Secondary | ICD-10-CM

## 2019-12-14 DIAGNOSIS — G8929 Other chronic pain: Secondary | ICD-10-CM

## 2019-12-14 DIAGNOSIS — E1129 Type 2 diabetes mellitus with other diabetic kidney complication: Secondary | ICD-10-CM | POA: Diagnosis not present

## 2019-12-14 DIAGNOSIS — N183 Chronic kidney disease, stage 3 unspecified: Secondary | ICD-10-CM

## 2019-12-14 DIAGNOSIS — I700 Atherosclerosis of aorta: Secondary | ICD-10-CM

## 2019-12-14 DIAGNOSIS — I129 Hypertensive chronic kidney disease with stage 1 through stage 4 chronic kidney disease, or unspecified chronic kidney disease: Secondary | ICD-10-CM

## 2019-12-14 DIAGNOSIS — K5909 Other constipation: Secondary | ICD-10-CM

## 2019-12-14 DIAGNOSIS — E785 Hyperlipidemia, unspecified: Secondary | ICD-10-CM

## 2019-12-14 LAB — POCT GLYCOSYLATED HEMOGLOBIN (HGB A1C): Hemoglobin A1C: 6.9 % — AB (ref 4.0–5.6)

## 2019-12-14 MED ORDER — METFORMIN HCL ER 750 MG PO TB24: 750.0000 mg | ORAL_TABLET | Freq: Every day | ORAL | 0 refills | Status: DC

## 2019-12-14 MED ORDER — OLMESARTAN MEDOXOMIL 20 MG PO TABS: 20.0000 mg | ORAL_TABLET | ORAL | 1 refills | Status: DC

## 2019-12-14 MED ORDER — ONETOUCH DELICA PLUS LANCET30G MISC: 1.0000 | Freq: Every day | 2 refills | Status: DC

## 2019-12-14 MED ORDER — LUBIPROSTONE 24 MCG PO CAPS: 24.0000 ug | ORAL_CAPSULE | Freq: Two times a day (BID) | ORAL | 3 refills | Status: DC

## 2019-12-14 NOTE — Progress Notes (Signed)
Name: Taylor [name]   MRN: [id number]    DOB: Apr 29, 1932   Date:12/14/2019       Progress Note  Subjective  Chief Complaint  Chief Complaint  Patient presents with  . Follow-up    4 month follow up    HPI  Weight loss: her weight went from 205 lbs to 183 lbs back in Feb 2021, but has been stable since, we checked multiple labs and within normal limits earlier this year . She did not change her diet. Since last visit she was seen by Dr. Hampton Abbot - breast lump was fibroadenoma , she had a virtual visit with Dr. Bonna Gains for evaluation of iron deficiency anemia and had EGD and colonoscopy. Her weight is stable since last visit Appetite is normal.   EV:[id number] glucoseis usually around 150's taking metformin 750 mg daily, today A1 C is slightly up from 6.7. Glucose is 125-160's fasting but it can go up to 215 post-prandially but usually below 180. She denies polyphagia, polydipsia or polyuria. . She has a history of microalbuminuria and is taking ARB, she also has CKI andis under the care ofDr. Candiss Norse. She takesLovaza and statinfor dyslipidemia and denies side effects.She denies side effects of medication. Continue current regiment to keep A1C around 7 %    Morbid obesity: VMI above 35 with DM, HTN, dyslipidemia, GERD, because of her age and recent weight unintentional weight loss advised her not to lose more weight   Hearing loss: very difficulty to hear [place], had to repeat same question multiple times, shehas hearing aids but forgot to put them on again. . I had to remove mymask so it could make it easier for her to read my lips   HTN with CKI stage III: also has secondary hyperparathyroidism, last Pth was 129 September 2021 , under the care of nephrologist, Dr. Candiss Norse.No pruritis noticed, good urine outputLast GFR was down to 52.Her bp today is at goal   Chronic pain: she has chronic right knee pain, under the care of pain clinic and uses a walker to assist with  ambulation. She also has daily back pain and feels weak on her back, she is under the care of Dr. Andree Elk   GERD: well controlled with Pepcid, no heartburn or indigestion, denies vomiting . Unchanged   Chronic constipation: she has Amitiza but only taking once a day and has bowel movements about twice a week , very seldom has abdominal pain, no blood in stools   Atherosclerosis aorta: on statin therapy not on aspirin because ofhigh risk of falls.Last LDL was at goal at 46  Osteoporosis: seeing Dr. Honor Junes for Prolia and is tolerating it well, next injection is next week.   Last visit her cousin brought her in and told CMA that she was concerned Ms. Fay has memory problems. Patient disagreed with cousin and did not let her come to exam room. She told me last visit her daughter was going to move in with her, but she is still living alone, she brought all her medication. She is still able to cook for herself, she has difficulty moving around , uses walker , difficulty cleaning.   Patient Active Problem List   Diagnosis Date Noted  . Gastric polyp   . Polyp of colon   . Morbid obesity ([place]) 04/08/2018  . Age-related osteoporosis without current pathological fracture 09/26/2017  . Pain in both hands 09/23/2017  . Hyperparathyroidism ([place]) 08/06/2017  . Chronic kidney disease, stage III (moderate) ([place]) 08/06/2017  .  HLD (hyperlipidemia) 07/25/2017  . Osteoporotic compression fracture of spine ([place]) 03/22/2017  . Assistance needed with transportation 03/21/2017  . Iron deficiency anemia 12/19/2016  . Aortic atherosclerosis ([place]) 11/23/2016  . Anterolisthesis 11/23/2016  . Chronic pain syndrome 09/13/2016  . Vitamin D deficiency 08/19/2016  . Anemia, unspecified 08/19/2016  . Primary open angle glaucoma (POAG) of both eyes, severe stage 08/17/2016  . Hypertensive retinopathy 08/17/2016  . Long term current use of opiate analgesic 07/19/2016  . Dyslipidemia associated with type 2  diabetes mellitus ([place]) 04/16/2016  . Chronic midline low back pain without sciatica 04/16/2016  . Chronic pain of right knee 04/16/2016  . Vaginal dryness 02/22/2015  . Calculus of kidney 11/10/2014  . Osteoarthritis, multiple sites 09/09/2014  . Type 2 diabetes mellitus with microalbuminuria ([place]) 09/09/2014  . Glaucoma 09/09/2014  . Benign hypertension with CKD (chronic kidney disease) stage III ([place]) 09/09/2014  . Chronic pain 09/09/2014  . Tinea corporis 09/09/2014  . Umbilical hernia 58/52/7782  . Mass of right breast     Past Surgical History:  Procedure Laterality Date  . ABDOMINAL HYSTERECTOMY  1958   menorrhagia  . BACK SURGERY    . BREAST BIOPSY Left 01/25/2012  . BREAST BIOPSY Right 04/08/2019   hear clip, [place] bx, path pending  . BREAST EXCISIONAL BIOPSY    . BREAST LUMPECTOMY WITH NEEDLE LOCALIZATION Right 05/12/2019   Procedure: BREAST LUMPECTOMY WITH NEEDLE LOCALIZATION;  Surgeon: Olean Ree, MD;  Location: [facility];  Service: General;  Laterality: Right;  . CATARACT EXTRACTION, BILATERAL Bilateral    1st 06/07/15 and the 2nd 06/21/15  . CHOLECYSTECTOMY    . COLONOSCOPY  2003   [facility]  . COLONOSCOPY WITH PROPOFOL N/A 07/02/2019   Procedure: COLONOSCOPY WITH PROPOFOL;  Surgeon: Virgel Manifold, MD;  Location: [facility];  Service: Endoscopy;  Laterality: N/A;  . COLONOSCOPY WITH PROPOFOL N/A 07/16/2019   Procedure: COLONOSCOPY WITH PROPOFOL;  Surgeon: Virgel Manifold, MD;  Location: [facility];  Service: Endoscopy;  Laterality: N/A;  . ESOPHAGOGASTRODUODENOSCOPY (EGD) WITH PROPOFOL N/A 07/02/2019   Procedure: ESOPHAGOGASTRODUODENOSCOPY (EGD) WITH PROPOFOL;  Surgeon: Virgel Manifold, MD;  Location: [facility] END[facility];  Service: Endoscopy;  Laterality: N/A;  . EYE SURGERY    . KNEE SURGERY  2009,2011,2012   twice on right and once on left  . KNEE SURGERY    . LIPOMA EXCISION  1998  . [place]  2004  . TONSILLECTOMY     age of 15    Family  History  Problem Relation Age of Onset  . Cancer Mother        skin cancer  . Stroke Mother   . Heart disease Father   . Breast cancer Neg Hx     Social History   Tobacco Use  . Smoking status: Never Smoker  . Smokeless tobacco: Never Used  Substance Use Topics  . Alcohol use: No    Alcohol/week: 0.0 standard drinks     Current Outpatient Medications:  .  acetaminophen (TYLENOL) 500 MG tablet, Take 1 tablet (500 mg total) by mouth 3 (three) times daily. (Patient taking differently: Take 500 mg by mouth every 6 (six) hours as needed for moderate pain or headache. ), Disp: 90 tablet, Rfl: 0 .  amLODipine (NORVASC) 10 MG tablet, TAKE 1 TABLET BY MOUTH EVERY DAY, Disp: 90 tablet, Rfl: 1 .  AZOPT 1 % ophthalmic suspension, Place 1 drop into both eyes in the morning and at bedtime. , Disp: , Rfl:  .  brimonidine-timolol (COMBIGAN) 0.2-0.5 % ophthalmic solution, Place 1 drop into both eyes 2 (two) times daily. , Disp: , Rfl:  .  Calcium Carbonate-Vitamin D (OYSTER SHELL CALCIUM 500 + D PO), Take 1 tablet by mouth 2 (two) times a day., Disp: , Rfl:  .  denosumab (PROLIA) 60 MG/ML SOSY injection, Inject into the skin., Disp: , Rfl:  .  famotidine (PEPCID) 20 MG tablet, TAKE 1 TABLET BY MOUTH EVERY DAY, Disp: 90 tablet, Rfl: 1 .  ferrous sulfate 325 (65 FE) MG tablet, TAKE 1 TABLET BY MOUTH EVERY DAY WITH BREAKFAST, Disp: 90 tablet, Rfl: 1 .  fluticasone (FLONASE) 50 MCG/ACT nasal spray, SPRAY 2 SPRAYS INTO EACH NOSTRIL EVERY DAY, Disp: 48 mL, Rfl: 1 .  metFORMIN (GLUCOPHAGE-XR) 750 MG 24 hr tablet, Take 1 tablet (750 mg total) by mouth daily with breakfast., Disp: 90 tablet, Rfl: 0 .  olmesartan (BENICAR) 20 MG tablet, Take 1 tablet (20 mg total) by mouth every morning., Disp: 90 tablet, Rfl: 1 .  omega-3 acid ethyl esters (LOVAZA) 1 g capsule, Take 1 capsule (1 g total) by mouth 2 (two) times daily., Disp: 180 capsule, Rfl: 0 .  ONETOUCH ULTRA test strip, USE AS DIRECTED, Disp: 100 strip,  Rfl: 12 .  [START ON 01/02/2020] oxyCODONE-acetaminophen (PERCOCET) 7.5-325 MG tablet, Take 1 tablet by mouth every 8 (eight) hours as needed for moderate pain or severe pain., Disp: 90 tablet, Rfl: 0 .  [START ON 02/01/2020] oxyCODONE-acetaminophen (PERCOCET) 7.5-325 MG tablet, Take 1 tablet by mouth every 8 (eight) hours as needed for moderate pain or severe pain., Disp: 90 tablet, Rfl: 0 .  pravastatin (PRAVACHOL) 40 MG tablet, Take 1 tablet (40 mg total) by mouth at bedtime., Disp: 90 tablet, Rfl: 1 .  ROCKLATAN 0.02-0.005 % SOLN, Apply 1 drop to eye at bedtime., Disp: , Rfl:  .  Vitamin D, Ergocalciferol, (DRISDOL) 1.25 MG (50000 UNIT) CAPS capsule, Take 50,000 Units by mouth every Wednesday., Disp: , Rfl:  .  bimatoprost (LUMIGAN) 0.01 % SOLN, Place 1 drop into both eyes at bedtime.  (Patient not taking: Reported on 12/14/2019), Disp: , Rfl:  .  Lancets (ONETOUCH DELICA PLUS [id number]) MISC, 1 each by Does not apply route daily., Disp: 100 each, Rfl: 2 .  lubiprostone (AMITIZA) 24 MCG capsule, Take 1 capsule (24 mcg total) by mouth 2 (two) times daily with a meal., Disp: 60 capsule, Rfl: 3  Allergies  Allergen Reactions  . Ace Inhibitors Cough    I personally reviewed active problem list, medication list, allergies, family history, social history, health maintenance with the patient/caregiver today.   ROS  Constitutional: Negative for fever or weight change.  Respiratory: Negative for cough and shortness of breath.   Cardiovascular: Negative for chest pain or palpitations.  Gastrointestinal: Negative for abdominal pain, no bowel changes.  Musculoskeletal: positive  for gait problem and intermittent  joint swelling.  Skin: Negative for rash.  Neurological: Negative for dizziness or headache.  No other specific complaints in a complete review of systems (except as listed in HPI above).  Objective  Vitals:   12/14/19 1410  BP: (!) 130/58  Pulse: 61  Resp: 16  Temp: 98.2 F  (36.8 C)  SpO2: 96%  Weight: 183 lb 14.4 oz (83.4 kg)  Height: 5\' 1"  (1.549 m)    Body mass index is 34.75 kg/m.  Physical Exam  Constitutional: Patient appears well-developed and well-nourished.  No distress.  HEENT: head atraumatic, normocephalic, pupils equal and reactive to  light,  neck supple Cardiovascular: Normal rate, regular rhythm and normal heart sounds. 2/6 SEM  murmur heard. Trace BLE edema. Pulmonary/Chest: Effort normal and breath sounds normal. No respiratory distress. Abdominal: Soft.  There is no tenderness. Muscular Skeletal: slow gait, uses walker  Psychiatric: Patient has a normal mood and affect. behavior is normal. Judgment and thought content normal.  Recent Results (from the past 2160 hour(s))  POCT HgB A1C     Status: Abnormal   Collection Time: 12/14/19  2:37 PM  Result Value Ref Range   Hemoglobin A1C 6.9 (A) 4.0 - 5.6 %   HbA1c POC (<> result, manual entry)     HbA1c, POC (prediabetic range)     HbA1c, POC (controlled diabetic range)      Diabetic Foot Exam: Diabetic Foot Exam - Simple   Simple Foot Form Diabetic Foot exam was performed with the following findings: Yes 12/14/2019  3:07 PM  Visual Inspection See comments: Yes Sensation Testing Intact to touch and monofilament testing bilaterally: Yes Pulse Check Posterior Tibialis and Dorsalis pulse intact bilaterally: Yes Comments Bunions and dry skin      PHQ2/9: Depression screen [facility] 2/9 12/14/2019 08/27/2019 08/14/2019 05/29/2019 04/28/2019  Decreased Interest 0 0 0 0 0  Down, Depressed, Hopeless 0 0 0 0 0  PHQ - 2 Score 0 0 0 0 0  Altered sleeping - - 0 0 -  Tired, decreased energy - - 0 0 -  Change in appetite - - 0 0 -  Feeling bad or failure about yourself  - - 0 0 -  Trouble concentrating - - 0 0 -  Moving slowly or fidgety/restless - - 0 0 -  Suicidal thoughts - - 0 0 -  PHQ-9 Score - - 0 0 -  Difficult doing work/chores - - - - -  Some recent data might be hidden    phq 9 is  negative   Fall Risk: Fall Risk  12/14/2019 10/30/2019 08/14/2019 05/29/2019 05/27/2019  Falls in the past year? 0 0 0 0 0  Comment - - - - -  Number falls in past yr: 0 - 0 0 0  Injury with Fall? 0 - 0 0 0  Risk Factor Category  - - - - -  Risk for fall due to : - - - - -  Follow up - - - - -     Functional Status Survey: Is the patient deaf or have difficulty hearing?: Yes Does the patient have difficulty seeing, even when wearing glasses/contacts?: Yes (sometimes) Does the patient have difficulty concentrating, remembering, or making decisions?: No Does the patient have difficulty walking or climbing stairs?: Yes Does the patient have difficulty dressing or bathing?: Yes (sometimes) Does the patient have difficulty doing errands alone such as visiting a doctor's office or shopping?: Yes    Assessment & Plan    1. Type 2 diabetes mellitus with microalbuminuria, without long-term current use of insulin (HCC)  - POCT HgB A1C - HM Diabetes Foot Exam - metFORMIN (GLUCOPHAGE-XR) 750 MG 24 hr tablet; Take 1 tablet (750 mg total) by mouth daily with breakfast.  Dispense: 90 tablet; Refill: 0 - Lancets (ONETOUCH DELICA PLUS [id number]) [place]; 1 each by Does not apply route daily.  Dispense: 100 each; Refill: 2 - Lipid panel - COMPLETE METABOLIC PANEL WITH GFR  2. Essential hypertension  - olmesartan (BENICAR) 20 MG tablet; Take 1 tablet (20 mg total) by mouth every morning.  Dispense: 90 tablet; Refill: 1  3. Chronic bilateral low back pain without sciatica  Under the care of pain clinic   4. Dyslipidemia associated with type 2 diabetes mellitus ([place])   5. Gait difficulty  Using walking   6. Aortic atherosclerosis ([place])  On statin therapy, return for labs  7. Stage 3a chronic kidney disease ([place])  Keep follow up with nephrologist   8. Age-related osteoporosis without current pathological fracture  Going to get Prolia next week   9. Perennial allergic  rhinitis   10. Hyperparathyroidism ([place])   11. Benign hypertension with CKD (chronic kidney disease) stage III ([place])   12. Chronic constipation  - lubiprostone (AMITIZA) 24 MCG capsule; Take 1 capsule (24 mcg total) by mouth 2 (two) times daily with a meal.  Dispense: 60 capsule; Refill: 3

## 2019-12-22 ENCOUNTER — Telehealth: Payer: Self-pay

## 2019-12-23 ENCOUNTER — Telehealth: Payer: Self-pay

## 2019-12-23 NOTE — Telephone Encounter (Signed)
Copied from [place] [id number]. Topic: General - Call Back - No Documentation >> Dec 23, 2019 12:03 PM Hinda Lenis D wrote: PT returning / no notes / please advise

## 2019-12-28 ENCOUNTER — Other Ambulatory Visit: Payer: Self-pay

## 2019-12-28 ENCOUNTER — Ambulatory Visit (INDEPENDENT_AMBULATORY_CARE_PROVIDER_SITE_OTHER): Payer: Medicare Other

## 2019-12-28 DIAGNOSIS — R809 Proteinuria, unspecified: Secondary | ICD-10-CM | POA: Diagnosis not present

## 2019-12-28 DIAGNOSIS — Z23 Encounter for immunization: Secondary | ICD-10-CM | POA: Diagnosis not present

## 2019-12-28 DIAGNOSIS — E1129 Type 2 diabetes mellitus with other diabetic kidney complication: Secondary | ICD-10-CM | POA: Diagnosis not present

## 2019-12-29 LAB — COMPLETE METABOLIC PANEL WITH GFR
AG Ratio: 1.5 (calc) (ref 1.0–2.5)
ALT: 4 U/L — ABNORMAL LOW (ref 6–29)
AST: 8 U/L — ABNORMAL LOW (ref 10–35)
Albumin: 3.9 g/dL (ref 3.6–5.1)
Alkaline phosphatase (APISO): 46 U/L (ref 37–153)
BUN/Creatinine Ratio: 16 (calc) (ref 6–22)
BUN: 21 mg/dL (ref 7–25)
CO2: 22 mmol/L (ref 20–32)
Calcium: 9.7 mg/dL (ref 8.6–10.4)
Chloride: 109 mmol/L (ref 98–110)
Creat: 1.35 mg/dL — ABNORMAL HIGH (ref 0.60–0.88)
GFR, Est African American: 42 mL/min/{1.73_m2} — ABNORMAL LOW (ref >=60)
GFR, Est Non African American: 36 mL/min/{1.73_m2} — ABNORMAL LOW (ref >=60)
Globulin: 2.6 g/dL (calc) (ref 1.9–3.7)
Glucose, Bld: 200 mg/dL — ABNORMAL HIGH (ref 65–99)
Potassium: 5.1 mmol/L (ref 3.5–5.3)
Sodium: 141 mmol/L (ref 135–146)
Total Bilirubin: 0.3 mg/dL (ref 0.2–1.2)
Total Protein: 6.5 g/dL (ref 6.1–8.1)

## 2019-12-29 LAB — LIPID PANEL
Cholesterol: 136 mg/dL (ref <200)
HDL: 47 mg/dL — ABNORMAL LOW (ref >=50)
LDL Cholesterol (Calc): 64 mg/dL (calc)
Non-HDL Cholesterol (Calc): 89 mg/dL (calc) (ref <130)
Total CHOL/HDL Ratio: 2.9 (calc) (ref <5.0)
Triglycerides: 168 mg/dL — ABNORMAL HIGH (ref <150)

## 2019-12-29 NOTE — Progress Notes (Signed)
Labs stable, still has chronic kidney disease stage III  needs to avoid nsaid's  Lipid panel showed LDL is at goal, triglycerides slightly elevated if she was fasting, HDL is unchanged, she needs to eat more fish, tree nuts and exercise more to bring levels up

## 2019-12-31 DIAGNOSIS — M810 Age-related osteoporosis without current pathological fracture: Secondary | ICD-10-CM | POA: Diagnosis not present

## 2020-01-04 ENCOUNTER — Telehealth: Payer: Self-pay | Admitting: Anesthesiology

## 2020-01-04 MED ORDER — OXYCODONE-ACETAMINOPHEN 7.5-325 MG PO TABS
1.0000 | ORAL_TABLET | Freq: Three times a day (TID) | ORAL | 0 refills | Status: AC | PRN
Start: 2020-01-04 — End: 2020-02-03

## 2020-01-04 MED ORDER — OXYCODONE-ACETAMINOPHEN 7.5-325 MG PO TABS
1.0000 | ORAL_TABLET | Freq: Three times a day (TID) | ORAL | 0 refills | Status: AC | PRN
Start: 2020-02-01 — End: 2020-03-02

## 2020-01-04 NOTE — Telephone Encounter (Signed)
Patient called pharmacy to get Oxycodone filled and was told they do not have a script to fill. She has a script in chart for 01-02-20 and one for 02-01-20. Please call pharmacy and find out problem

## 2020-01-04 NOTE — Addendum Note (Signed)
Addended by: [name] on: 01/04/2020 03:54 PM   Modules accepted: Orders

## 2020-01-04 NOTE — Telephone Encounter (Signed)
Scripts resent by Dr. Andree Elk. Attempted to call patient, no voicemail available.

## 2020-01-07 ENCOUNTER — Other Ambulatory Visit: Payer: Self-pay | Admitting: Family Medicine

## 2020-01-07 DIAGNOSIS — E1169 Type 2 diabetes mellitus with other specified complication: Secondary | ICD-10-CM

## 2020-01-28 ENCOUNTER — Other Ambulatory Visit: Payer: Self-pay | Admitting: Family Medicine

## 2020-01-28 DIAGNOSIS — E1129 Type 2 diabetes mellitus with other diabetic kidney complication: Secondary | ICD-10-CM

## 2020-01-28 DIAGNOSIS — R809 Proteinuria, unspecified: Secondary | ICD-10-CM

## 2020-01-31 ENCOUNTER — Other Ambulatory Visit: Payer: Self-pay | Admitting: Family Medicine

## 2020-01-31 DIAGNOSIS — E782 Mixed hyperlipidemia: Secondary | ICD-10-CM

## 2020-02-01 DIAGNOSIS — B351 Tinea unguium: Secondary | ICD-10-CM | POA: Diagnosis not present

## 2020-02-01 DIAGNOSIS — M79674 Pain in right toe(s): Secondary | ICD-10-CM | POA: Diagnosis not present

## 2020-02-01 DIAGNOSIS — M79675 Pain in left toe(s): Secondary | ICD-10-CM | POA: Diagnosis not present

## 2020-02-17 ENCOUNTER — Other Ambulatory Visit: Payer: Self-pay | Admitting: Family Medicine

## 2020-02-17 DIAGNOSIS — K219 Gastro-esophageal reflux disease without esophagitis: Secondary | ICD-10-CM

## 2020-02-17 DIAGNOSIS — E7800 Pure hypercholesterolemia, unspecified: Secondary | ICD-10-CM

## 2020-02-17 DIAGNOSIS — E1129 Type 2 diabetes mellitus with other diabetic kidney complication: Secondary | ICD-10-CM

## 2020-02-17 DIAGNOSIS — R809 Proteinuria, unspecified: Secondary | ICD-10-CM

## 2020-03-07 ENCOUNTER — Telehealth: Payer: Self-pay

## 2020-03-07 NOTE — Telephone Encounter (Signed)
Received message from patient stating she needed Dr Andree Elk to send in her oxycodone.  Patient has not had an appointment since October.  Called patient and informed her to call office in the morning and schedule a med refill appointment with Dr Andree Elk.  Patient states understanding.

## 2020-03-09 ENCOUNTER — Other Ambulatory Visit: Payer: Self-pay

## 2020-03-09 ENCOUNTER — Ambulatory Visit: Payer: Medicare Other | Attending: Anesthesiology | Admitting: Anesthesiology

## 2020-03-09 DIAGNOSIS — M79641 Pain in right hand: Secondary | ICD-10-CM | POA: Diagnosis not present

## 2020-03-09 DIAGNOSIS — M159 Polyosteoarthritis, unspecified: Secondary | ICD-10-CM | POA: Diagnosis not present

## 2020-03-09 DIAGNOSIS — G894 Chronic pain syndrome: Secondary | ICD-10-CM

## 2020-03-09 DIAGNOSIS — M25561 Pain in right knee: Secondary | ICD-10-CM

## 2020-03-09 DIAGNOSIS — M25562 Pain in left knee: Secondary | ICD-10-CM | POA: Diagnosis not present

## 2020-03-09 DIAGNOSIS — G8929 Other chronic pain: Secondary | ICD-10-CM

## 2020-03-09 DIAGNOSIS — Z79891 Long term (current) use of opiate analgesic: Secondary | ICD-10-CM

## 2020-03-09 DIAGNOSIS — M545 Low back pain, unspecified: Secondary | ICD-10-CM

## 2020-03-09 DIAGNOSIS — M79642 Pain in left hand: Secondary | ICD-10-CM | POA: Diagnosis not present

## 2020-03-09 MED ORDER — OXYCODONE-ACETAMINOPHEN 7.5-325 MG PO TABS: 1.0000 | ORAL_TABLET | Freq: Three times a day (TID) | ORAL | 0 refills | Status: AC | PRN

## 2020-03-09 MED ORDER — OXYCODONE-ACETAMINOPHEN 7.5-325 MG PO TABS: 1.0000 | ORAL_TABLET | Freq: Three times a day (TID) | ORAL | 0 refills | Status: DC | PRN

## 2020-03-10 ENCOUNTER — Encounter: Payer: Self-pay | Admitting: Anesthesiology

## 2020-03-10 NOTE — Progress Notes (Signed)
Virtual Visit via Telephone Note  I connected with Taylor [name] on 03/10/20 at  1:55 PM EST by telephone and verified that I am speaking with the correct person using two identifiers.  Location: Patient: Home Provider: Pain control center   I discussed the limitations, risks, security and privacy concerns of performing an evaluation and management service by telephone and the availability of in person appointments. I also discussed with the patient that there may be a patient responsible charge related to this service. The patient expressed understanding and agreed to proceed.   History of Present Illness: I was able to speak with Taylor [name] via telephone today as she was unable to do the video portion of the virtual conference.  She reports that her hand pain and back pain are stable without change.  The quality characteristic and distribution of those pains has been stable.  She still taking her Percocet 3 times a day and this continues to work well for her.  No side effects are reported.  She generally gets 75 to 80% relief lasting 4 to 6 hours before she has recurrence.  The medications continue to keep her active and enable her to keep her pain under control.  She feels like she is doing reasonably well.  The knee pain persists and no changes are reported there either.  Otherwise she seems to be doing well with her current regimen.   Observations/Objective:   Current Outpatient Medications:  .  oxyCODONE-acetaminophen (PERCOCET) 7.5-325 MG tablet, Take 1 tablet by mouth every 8 (eight) hours as needed for moderate pain or severe pain., Disp: 90 tablet, Rfl: 0 .  [START ON 04/08/2020] oxyCODONE-acetaminophen (PERCOCET) 7.5-325 MG tablet, Take 1 tablet by mouth every 8 (eight) hours as needed for moderate pain or severe pain., Disp: 90 tablet, Rfl: 0 .  acetaminophen (TYLENOL) 500 MG tablet, Take 1 tablet (500 mg total) by mouth 3 (three) times daily. (Patient taking differently: Take 500 mg by  mouth every 6 (six) hours as needed for moderate pain or headache. ), Disp: 90 tablet, Rfl: 0 .  amLODipine (NORVASC) 10 MG tablet, TAKE 1 TABLET BY MOUTH EVERY DAY, Disp: 90 tablet, Rfl: 1 .  AZOPT 1 % ophthalmic suspension, Place 1 drop into both eyes in the morning and at bedtime. , Disp: , Rfl:  .  bimatoprost (LUMIGAN) 0.01 % SOLN, Place 1 drop into both eyes at bedtime.  (Patient not taking: Reported on 12/14/2019), Disp: , Rfl:  .  brimonidine-timolol (COMBIGAN) 0.2-0.5 % ophthalmic solution, Place 1 drop into both eyes 2 (two) times daily. , Disp: , Rfl:  .  Calcium Carbonate-Vitamin D (OYSTER SHELL CALCIUM 500 + D PO), Take 1 tablet by mouth 2 (two) times a day., Disp: , Rfl:  .  denosumab (PROLIA) 60 MG/ML SOSY injection, Inject into the skin., Disp: , Rfl:  .  famotidine (PEPCID) 20 MG tablet, TAKE 1 TABLET BY MOUTH EVERY DAY, Disp: 90 tablet, Rfl: 1 .  ferrous sulfate 325 (65 FE) MG tablet, TAKE 1 TABLET BY MOUTH EVERY DAY WITH BREAKFAST, Disp: 90 tablet, Rfl: 1 .  fluticasone (FLONASE) 50 MCG/ACT nasal spray, SPRAY 2 SPRAYS INTO EACH NOSTRIL EVERY DAY, Disp: 48 mL, Rfl: 1 .  Lancets (ONETOUCH DELICA PLUS [id number]) MISC, 1 each by Does not apply route daily., Disp: 100 each, Rfl: 2 .  lubiprostone (AMITIZA) 24 MCG capsule, Take 1 capsule (24 mcg total) by mouth 2 (two) times daily with a meal., Disp: 60 capsule, Rfl:  3 .  metFORMIN (GLUCOPHAGE-XR) 750 MG 24 hr tablet, TAKE 1 TABLET BY MOUTH EVERY DAY WITH BREAKFAST, Disp: 90 tablet, Rfl: 0 .  olmesartan (BENICAR) 20 MG tablet, Take 1 tablet (20 mg total) by mouth every morning., Disp: 90 tablet, Rfl: 1 .  omega-3 acid ethyl esters (LOVAZA) 1 g capsule, TAKE 1 CAPSULE (1 G TOTAL) BY MOUTH 2 (TWO) TIMES DAILY., Disp: 180 capsule, Rfl: 1 .  ONETOUCH ULTRA test strip, USE AS DIRECTED, Disp: 100 strip, Rfl: 12 .  pravastatin (PRAVACHOL) 40 MG tablet, TAKE 1 TABLET BY MOUTH EVERYDAY AT BEDTIME, Disp: 90 tablet, Rfl: 1 .  ROCKLATAN 0.02-0.005  % SOLN, Apply 1 drop to eye at bedtime., Disp: , Rfl:  .  Vitamin D, Ergocalciferol, (DRISDOL) 1.25 MG (50000 UNIT) CAPS capsule, Take 50,000 Units by mouth every Wednesday., Disp: , Rfl:  Assessment and Plan:  1. Chronic pain syndrome   2. Chronic pain of right knee   3. Pain in both hands   4. Chronic midline low back pain without sciatica   5. Long term prescription opiate use   6. Chronic pain of both knees   7. Osteoarthritis of multiple joints, unspecified osteoarthritis type   Based on our discussion today and upon review of the [facility] practitioner database information I am refilling her medications for the next 2 months.  This be dated for January 19 and February 18.  We will schedule her for 52-month return to clinic.  No changes are made otherwise in her pain management protocol.  She is to continue follow-up with her primary care physicians for baseline medical care. Follow Up Instructions:    I discussed the assessment and treatment plan with the patient. The patient was provided an opportunity to ask questions and all were answered. The patient agreed with the plan and demonstrated an understanding of the instructions.   The patient was advised to call back or seek an in-person evaluation if the symptoms worsen or if the condition fails to improve as anticipated.  I provided 25 minutes of non-face-to-face time during this encounter.   [name], MD

## 2020-03-12 ENCOUNTER — Other Ambulatory Visit: Payer: Self-pay | Admitting: Family Medicine

## 2020-03-12 DIAGNOSIS — E1129 Type 2 diabetes mellitus with other diabetic kidney complication: Secondary | ICD-10-CM

## 2020-03-16 ENCOUNTER — Telehealth: Payer: Medicare Other | Admitting: Anesthesiology

## 2020-03-21 DIAGNOSIS — Z961 Presence of intraocular lens: Secondary | ICD-10-CM | POA: Diagnosis not present

## 2020-03-21 DIAGNOSIS — H401133 Primary open-angle glaucoma, bilateral, severe stage: Secondary | ICD-10-CM | POA: Diagnosis not present

## 2020-03-21 DIAGNOSIS — H35372 Puckering of macula, left eye: Secondary | ICD-10-CM | POA: Diagnosis not present

## 2020-03-30 ENCOUNTER — Other Ambulatory Visit: Payer: Self-pay | Admitting: Family Medicine

## 2020-03-30 DIAGNOSIS — R809 Proteinuria, unspecified: Secondary | ICD-10-CM

## 2020-03-30 DIAGNOSIS — E1129 Type 2 diabetes mellitus with other diabetic kidney complication: Secondary | ICD-10-CM

## 2020-04-12 ENCOUNTER — Telehealth: Payer: Self-pay

## 2020-04-12 NOTE — Progress Notes (Signed)
Spoke to patient to confirmed patient telephone appointment on 04/13/2020 for CCM at 3:30 pm with Junius Argyle the Clinical pharmacist.   Patient verbalized understanding.  [place] Pharmacist Assistant [phone]

## 2020-04-13 ENCOUNTER — Ambulatory Visit (INDEPENDENT_AMBULATORY_CARE_PROVIDER_SITE_OTHER): Payer: Medicare Other

## 2020-04-13 DIAGNOSIS — I152 Hypertension secondary to endocrine disorders: Secondary | ICD-10-CM | POA: Diagnosis not present

## 2020-04-13 DIAGNOSIS — R809 Proteinuria, unspecified: Secondary | ICD-10-CM | POA: Diagnosis not present

## 2020-04-13 DIAGNOSIS — E1159 Type 2 diabetes mellitus with other circulatory complications: Secondary | ICD-10-CM | POA: Diagnosis not present

## 2020-04-13 DIAGNOSIS — E1129 Type 2 diabetes mellitus with other diabetic kidney complication: Secondary | ICD-10-CM

## 2020-04-13 DIAGNOSIS — E785 Hyperlipidemia, unspecified: Secondary | ICD-10-CM

## 2020-04-13 DIAGNOSIS — E1169 Type 2 diabetes mellitus with other specified complication: Secondary | ICD-10-CM

## 2020-04-13 NOTE — Progress Notes (Signed)
Chronic Care Management Pharmacy Note  04/18/2020 Name:  Taylor [name] MRN:  [id number] DOB:  1932-12-28  Subjective: Taylor [name] is an 87 y.o. year old female who is a primary patient of [name], MD.  The CCM team was consulted for assistance with disease management and care coordination needs.    Engaged with patient by telephone for follow up visit in response to provider referral for pharmacy case management and/or care coordination services.   Consent to Services:  The patient was given the following information about Chronic Care Management services today, agreed to services, and gave verbal consent: 1. CCM service includes personalized support from designated clinical staff supervised by the primary care provider, including individualized plan of care and coordination with other care providers 2. 24/7 contact phone numbers for assistance for urgent and routine care needs. 3. Service will only be billed when office clinical staff spend 20 minutes or more in a month to coordinate care. 4. Only one practitioner may furnish and bill the service in a calendar month. 5.The patient may stop CCM services at any time (effective at the end of the month) by phone call to the office staff. 6. The patient will be responsible for cost sharing (co-pay) of up to 20% of the service fee (after annual deductible is met). Patient agreed to services and consent obtained.  Patient Care Team: [name], MD as PCP - General (Family Medicine) Andree Elk Alvina Filbert, MD as Consulting Physician (Anesthesiology) [name], DPM as Consulting Physician (Podiatry) [name], MD (Nephrology) Virgel Manifold, MD as Consulting Physician (Gastroenterology) Germaine Pomfret, [facility] (Pharmacist)  Recent office visits: 12/14/19: Patient presented to Dr. Ancil Boozer for follow-up.   Recent consult visits: 03/09/20: Video visit with Dr. Andree Elk (Pain Management) for follow-up.   Hospital visits: None in  previous 6 months  Objective:  Lab Results  Component Value Date   CREATININE 1.35 (H) 12/28/2019   BUN 21 12/28/2019   GFRNONAA 36 (L) 12/28/2019   GFRAA 42 (L) 12/28/2019   NA 141 12/28/2019   K 5.1 12/28/2019   CALCIUM 9.7 12/28/2019   CO2 22 12/28/2019    Lab Results  Component Value Date/Time   HGBA1C 6.9 (A) 12/14/2019 02:37 PM   HGBA1C 6.7 (A) 08/14/2019 03:10 PM   HGBA1C 6.5 04/17/2019 02:08 PM   HGBA1C 6.6 08/13/2018 02:51 PM   HGBA1C 7.8 (A) 08/05/2017 11:30 AM   MICROALBUR 1.8 04/17/2019 03:12 PM   MICROALBUR 10.4 04/09/2018 02:43 PM   MICROALBUR 50 08/05/2017 11:28 AM   MICROALBUR 100 12/12/2015 12:21 PM    Last diabetic Eye exam:  Lab Results  Component Value Date/Time   HMDIABEYEEXA No Retinopathy 06/03/2017 12:00 AM    Last diabetic Foot exam: No results found for: HMDIABFOOTEX   Lab Results  Component Value Date   CHOL 136 12/28/2019   HDL 47 (L) 12/28/2019   LDLCALC 64 12/28/2019   TRIG 168 (H) 12/28/2019   CHOLHDL 2.9 12/28/2019    Hepatic Function Latest Ref Rng & Units 12/28/2019 04/17/2019 12/15/2018  Total Protein 6.1 - 8.1 g/dL 6.5 7.2 6.5  Albumin 3.6 - 5.1 g/dL - - -  AST 10 - 35 U/L 8(L) 11 7(L)  ALT 6 - 29 U/L 4(L) 9 5(L)  Alk Phosphatase 33 - 130 U/L - - -  Total Bilirubin 0.2 - 1.2 mg/dL 0.3 0.3 0.3    Lab Results  Component Value Date/Time   TSH 0.63 04/17/2019 03:12 PM  CBC Latest Ref Rng & Units 04/17/2019 12/15/2018 08/05/2017  WBC 3.8 - 10.8 Thousand/uL 7.3 6.6 7.1  Hemoglobin 11.7 - 15.5 g/dL 12.1 11.4(L) 11.2(L)  Hematocrit 35.0 - 45.0 % 37.1 35.1 34.6(L)  Platelets 140 - 400 Thousand/uL 332 371 328    Lab Results  Component Value Date/Time   VD25OH 29 (L) 08/05/2017 12:08 PM   VD25OH 16 (L) 08/14/2016 10:12 AM    Clinical ASCVD: Yes  The ASCVD Risk score Mikey Bussing DC Jr., et al., 2013) failed to calculate for the following reasons:   The 2013 ASCVD risk score is only valid for ages 88 to 70    Depression screen  PHQ 2/9 12/14/2019 08/27/2019 08/14/2019  Decreased Interest 0 0 0  Down, Depressed, Hopeless 0 0 0  PHQ - 2 Score 0 0 0  Altered sleeping - - 0  Tired, decreased energy - - 0  Change in appetite - - 0  Feeling bad or failure about yourself  - - 0  Trouble concentrating - - 0  Moving slowly or fidgety/restless - - 0  Suicidal thoughts - - 0  PHQ-9 Score - - 0  Difficult doing work/chores - - -  Some recent data might be hidden    Social History   Tobacco Use  Smoking Status Never Smoker  Smokeless Tobacco Never Used   BP Readings from Last 3 Encounters:  12/14/19 (!) 130/58  10/30/19 (!) 185/89  09/02/19 (!) 166/83   Pulse Readings from Last 3 Encounters:  12/14/19 61  10/30/19 84  09/02/19 87   Wt Readings from Last 3 Encounters:  12/14/19 183 lb 14.4 oz (83.4 kg)  10/30/19 188 lb (85.3 kg)  09/02/19 187 lb (84.8 kg)    Assessment/Interventions: Review of patient past medical history, allergies, medications, health status, including review of consultants reports, laboratory and other test data, was performed as part of comprehensive evaluation and provision of chronic care management services.   SDOH:  (Social Determinants of Health) assessments and interventions performed: Yes SDOH Interventions   Flowsheet Row Most Recent Value  SDOH Interventions   Financial Strain Interventions Intervention Not Indicated      CCM Care Plan  Allergies  Allergen Reactions  . Ace Inhibitors Cough    Medications Reviewed Today    Reviewed by Germaine Pomfret, [facility] (Pharmacist) on 04/18/20 at [place] List Status: <None>  Medication Order Taking? Sig Documenting Provider Last Dose Status Informant  acetaminophen (TYLENOL) 500 MG tablet [id number]  Take 1 tablet (500 mg total) by mouth 3 (three) times daily.  Patient taking differently: Take 500 mg by mouth every 6 (six) hours as needed for moderate pain or headache.    [name], MD  Active Self  alendronate (FOSAMAX)  70 MG tablet [id number]  Take by mouth. [provider]  Active   amLODipine (NORVASC) 10 MG tablet [id number]  TAKE 1 TABLET BY MOUTH EVERY DAY Sowles, Drue Stager, MD  Active   AZOPT 1 % ophthalmic suspension [id number]  Place 1 drop into both eyes in the morning and at bedtime.  [provider]  Active Self  bimatoprost (LUMIGAN) 0.01 % SOLN [id number]  Place 1 drop into both eyes at bedtime.   Patient not taking: Reported on 12/14/2019   [provider]  Active Self  brimonidine-timolol (COMBIGAN) 0.2-0.5 % ophthalmic solution [id number]  Place 1 drop into both eyes 2 (two) times daily.  [provider]  Active Self  Calcium Carbonate-Vitamin D (OYSTER SHELL  CALCIUM 500 + D PO) [id number]  Take 1 tablet by mouth 2 (two) times a day. [provider]  Active Self  denosumab (PROLIA) 60 MG/ML SOSY injection [id number]  Inject into the skin. [provider]  Active   dorzolamide (TRUSOPT) 2 % ophthalmic solution [id number]  1 drop 2 (two) times daily. [provider]  Active   famotidine (PEPCID) 20 MG tablet [id number]  TAKE 1 TABLET BY MOUTH EVERY DAY [name], MD  Active   ferrous sulfate 325 (65 FE) MG tablet [id number]  TAKE 1 TABLET BY MOUTH EVERY DAY WITH [place], Drue Stager, MD  Active   fluticasone (FLONASE) 50 MCG/ACT nasal spray [id number]  SPRAY 2 SPRAYS INTO EACH NOSTRIL EVERY Eloise Harman, MD  Active   Lancets (ONETOUCH DELICA PLUS [id number]) [place] [id number]  1 each by Does not apply route daily. [name], MD  Active   lubiprostone (AMITIZA) 24 MCG capsule [id number]  TAKE 1 CAPSULE (24 MCG TOTAL) BY MOUTH 2 (TWO) TIMES DAILY WITH A MEAL. [name], MD  Active   metFORMIN (GLUCOPHAGE-XR) 750 MG 24 hr tablet [id number] Yes TAKE 1 TABLET BY MOUTH EVERY DAY WITH [place] Corning, Drue Stager, MD Taking Active   olmesartan (BENICAR) 20 MG tablet [id number] Yes Take 1 tablet (20 mg total) by mouth every morning. [name], MD Taking Active   omega-3 acid ethyl esters (LOVAZA) 1 g capsule [id number]  TAKE 1 CAPSULE (1 G TOTAL) BY MOUTH 2 (TWO) TIMES DAILY. [name], MD  Active   [facility] ULTRA test strip [id number]  USE AS DIRECTED [name], MD  Active Self  oxyCODONE-acetaminophen (PERCOCET) 7.5-325 MG tablet [id number]  Take 1 tablet by mouth every 8 (eight) hours as needed for moderate pain or severe pain. [name], MD  Active   pravastatin (PRAVACHOL) 40 MG tablet [id number]  TAKE 1 TABLET BY MOUTH EVERYDAY AT BEDTIME [name], MD  Active   ROCKLATAN 0.02-0.005 % SOLN [id number]  Apply 1 drop to eye at bedtime. [provider]  Active   Vitamin D, Ergocalciferol, (DRISDOL) 1.25 MG (50000 UNIT) CAPS capsule [id number]  Take 50,000 Units by mouth every Wednesday. [provider]  Active Self  Med List Note Landis Martins, RN 05/11/19 1432): UDS 05-11-19 MR 02/01/18          Patient Active Problem List   Diagnosis Date Noted  . Gastric polyp   . Polyp of colon   . Morbid obesity ([place]) 04/08/2018  . Age-related osteoporosis without current pathological fracture 09/26/2017  . Pain in both hands 09/23/2017  . Hyperparathyroidism ([place]) 08/06/2017  . Chronic kidney disease, stage III (moderate) ([place]) 08/06/2017  . HLD (hyperlipidemia) 07/25/2017  . Osteoporotic compression fracture of spine ([place]) 03/22/2017  . Assistance needed with transportation 03/21/2017  . Iron deficiency anemia 12/19/2016  . Aortic atherosclerosis ([place]) 11/23/2016  . Anterolisthesis 11/23/2016  . Chronic pain syndrome 09/13/2016  . Vitamin D deficiency 08/19/2016  . Anemia, unspecified 08/19/2016  . Primary open angle glaucoma (POAG) of both eyes, severe stage 08/17/2016  . Hypertensive retinopathy 08/17/2016  . Long term current use of opiate analgesic 07/19/2016  . Dyslipidemia associated with type 2 diabetes mellitus ([place]) 04/16/2016  . Chronic midline low back pain without  sciatica 04/16/2016  . Chronic pain of right knee 04/16/2016  . Vaginal dryness 02/22/2015  . Calculus of kidney 11/10/2014  . Osteoarthritis, multiple sites 09/09/2014  . Type 2 diabetes mellitus with microalbuminuria ([place]) 09/09/2014  .  Glaucoma 09/09/2014  . Benign hypertension with CKD (chronic kidney disease) stage III ([place]) 09/09/2014  . Chronic pain 09/09/2014  . Tinea corporis 09/09/2014  . Umbilical hernia 09/40/7680  . Mass of right breast     Immunization History  Administered Date(s) Administered  . Fluad Quad(high Dose 65+) 12/15/2018, 12/28/2019  . Influenza, High Dose Seasonal PF 11/10/2014, 11/17/2015, 11/23/2016, 12/06/2017  . Influenza-Unspecified 10/20/2012, 11/10/2014  . Moderna Sars-Covid-2 Vaccination 11/12/2019, 12/10/2019  . Pneumococcal Conjugate-13 05/12/2013, 08/05/2015  . Tdap 10/19/2010    Conditions to be addressed/monitored:  Hypertension, Hyperlipidemia, Diabetes, Coronary Artery Disease, Chronic Kidney Disease, Osteoporosis and Osteoarthritis  Care Plan : General Pharmacy (Adult)  Updates made by Germaine Pomfret, RPH since 04/18/2020 12:00 AM    Problem: Hypertension, Hyperlipidemia, Diabetes, Coronary Artery Disease, Chronic Kidney Disease, Osteoporosis and Osteoarthritis   Priority: High    Long-Range Goal: Patient-Specific Goal   Start Date: 04/13/2020  Expected End Date: 10/12/2020  This Visit's Progress: On track  Priority: High  Note:   Current Barriers:  . No barriers noted  Pharmacist Clinical Goal(s):  [name] Over the next 90 days, patient will maintain control of diabetes as evidenced by A1c less than 7%  through collaboration with PharmD and provider.   Interventions: . 1:1 collaboration with [name], MD regarding development and update of comprehensive plan of care as evidenced by provider attestation and co-signature . Inter-disciplinary care team collaboration (see longitudinal plan of care) . Comprehensive medication  review performed; medication list updated in electronic medical record  Hypertension (BP goal <140/90) -Not ideally controlled -Current treatment: . Amlodipine 10 mg daily  . Olmesartan 20 mg daily  -Medications previously tried: NA  -Current home readings: 136/67, 151/68, 151/76, 146/73, -Current dietary habits: Patient does not follow any specific dietary regimen -Current exercise habits: Patient does not follow any specific exercise regimen  -Denies hypotensive/hypertensive symptoms -Educated on Importance of home blood pressure monitoring; Proper BP monitoring technique; -Counseled to monitor BP at home weekly, document, and provide log at future appointments -Recommended to continue current medication  Hyperlipidemia: (LDL goal < 70) -Controlled -Current treatment: . Lovaza 1g twice daily  . Pravastatin 40 mg at bedtime  -Medications previously tried: NA  -Educated on Importance of limiting foods high in cholesterol; -Recommended to continue current medication  Diabetes (A1c goal <7%) -Controlled -Current medications: [name] Metformin XR 750 mg daily  -Medications previously tried: NA  -Current home glucose readings . fasting glucose: NA . post prandial glucose: 251, 180  . Pre meal: 150, 140s  -Denies hypoglycemic/hyperglycemic symptoms -Educated on A1c and blood sugar goals; Carbohydrate counting and/or plate method -Counseled to check feet daily and get yearly eye exams -Recommended to continue current medication  Osteoporosis(Goal Maintain bone density and prevent fractures) -Controlled -Last DEXA Scan: 04/30/2017   T-Score femoral neck: -3.3  T-Score total hip: -1.9  T-Score lumbar spine: NA  T-Score forearm radius: -2.3  10-year probability of major osteoporotic fracture: NA  10-year probability of hip fracture: NA -Patient is a candidate for pharmacologic treatment due to T-Score < -2.5 in femoral neck -Current treatment  . Prolia 60 mg every 6 months   -Medications previously tried: NA  -Recommend weight-bearing and muscle strengthening exercises for building and maintaining bone density. -Recommended to continue current medication   Patient Goals/Self-Care Activities . Over the next 90 days, patient will:  - check glucose daily before breakfast, document, and provide at future appointments check blood pressure weekly, document, and provide at future appointments  Follow Up Plan: Telephone follow up appointment with care management team member scheduled for: 10/12/2020 at 2:00 PM      Medication Assistance: None required.  Patient affirms current coverage meets needs.  Patient's preferred pharmacy is:  [id number] [place] [address] [id number]: [phone]: [phone] Uses pill box? Yes Pt endorses 100% compliance  We discussed: Current pharmacy is preferred with insurance plan and patient is satisfied with pharmacy services Patient decided to: Continue current medication management strategy  Care Plan and Follow Up Patient Decision:  Patient agrees to Care Plan and Follow-up.  Plan: Telephone follow up appointment with care management team member scheduled for:  10/12/2020 at 2:00 PM  [phone]

## 2020-04-14 ENCOUNTER — Other Ambulatory Visit: Payer: Self-pay | Admitting: Family Medicine

## 2020-04-14 DIAGNOSIS — K5909 Other constipation: Secondary | ICD-10-CM

## 2020-04-15 NOTE — Progress Notes (Signed)
Name: Taylor [name]   MRN: [id number]    DOB: [birth year]   Date:04/18/2020       Progress Note  Subjective  Chief Complaint  Follow up   HPI   Weight loss: her weight went from 205 lbs to 183 lbs back in Feb 2021, but has been stable since, we checked multiple labs and within normal limits earlier this year . She did not change her diet. Since last visit she was seen by Dr. Hampton Abbot - breast lump was fibroadenoma , she had a virtual visit with Dr. Bonna Gains for evaluation of iron deficiency anemia and had EGD and colonoscopy. Her weight is trending up now, but still not back to baseline   NW:GNFAOZHYQ glucoseis usually around 150's taking metformin 750 mg daily, today A1 C is stable at 6.8 %  Glucose is 130-180's. She denies polyphagia, polydipsia or polyuria. . She has a history of microalbuminuria and is taking ARB, she also has CKI andis under the care of nephrologist. She takesLovaza and statinfor dyslipidemia and denies side effects.She denies side effects of medication. Continue current regiment to keep A1C around 7 %    Morbid obesity: BMI  above 35 with DM, HTN, dyslipidemia, GERD, she has pain and difficulty standing up to prepare meals, she will try to arrange for meals on wheels again.   Hearing loss: very difficulty to hear [place], had to repeat same question multiple times, shehas hearing aids but forgot to put them on again. Reminded her to wear her hearing aids   HTN with CKI stage III: also has secondary hyperparathyroidism, last Pth was 129 September 2021 , under the care of nephrologist, Dr. Candiss Norse.No pruritis noticed, BP today is at goal, but has been between 135-50 at home, advised to recheck while resting when bp is high at home, continue medications for nwo   Chronic pain: she has chronic right knee pain, under the care of pain clinic and uses a walker to assist with ambulation. She also has daily back pain and feels weak on her back, she is under the care of  Dr. Andree Elk , she states she has difficulty taking care of her house, preparing meals, unable to get in her bath tub due to pain on right knee   GERD: well controlled with Pepcid, no heartburn or indigestion, denies vomiting . Unchanged   Chronic constipation: she has Amitiza but only taking once a day and has bowel movements about twice a week , very seldom has abdominal pain, no blood in stools   Atherosclerosis aorta: on statin therapy not on aspirin because ofhigh risk of falls.Last LDL was at goal at 46  Osteoporosis: seeing Dr. Honor Junes for Prolia and is tolerating it well, also on Fosamax  Patient lives alone and is not interested in placement. Daughter lives in [place] and her son drives a four wheeler and is usually not in town  Patient Active Problem List   Diagnosis Date Noted  . Gastric polyp   . Polyp of colon   . Morbid obesity ([place]) 04/08/2018  . Age-related osteoporosis without current pathological fracture 09/26/2017  . Pain in both hands 09/23/2017  . Hyperparathyroidism ([place]) 08/06/2017  . Chronic kidney disease, stage III (moderate) ([place]) 08/06/2017  . HLD (hyperlipidemia) 07/25/2017  . Osteoporotic compression fracture of spine ([place]) 03/22/2017  . Assistance needed with transportation 03/21/2017  . Iron deficiency anemia 12/19/2016  . Aortic atherosclerosis ([place]) 11/23/2016  . Anterolisthesis 11/23/2016  . Chronic pain syndrome 09/13/2016  . Vitamin  D deficiency 08/19/2016  . Anemia, unspecified 08/19/2016  . Primary open angle glaucoma (POAG) of both eyes, severe stage 08/17/2016  . Hypertensive retinopathy 08/17/2016  . Long term current use of opiate analgesic 07/19/2016  . Dyslipidemia associated with type 2 diabetes mellitus ([place]) 04/16/2016  . Chronic midline low back pain without sciatica 04/16/2016  . Chronic pain of right knee 04/16/2016  . Vaginal dryness 02/22/2015  . Calculus of kidney 11/10/2014  . Osteoarthritis, multiple sites  09/09/2014  . Type 2 diabetes mellitus with microalbuminuria ([place]) 09/09/2014  . Glaucoma 09/09/2014  . Benign hypertension with CKD (chronic kidney disease) stage III ([place]) 09/09/2014  . Chronic pain 09/09/2014  . Tinea corporis 09/09/2014  . Umbilical hernia 65/99/3570  . Mass of right breast     Past Surgical History:  Procedure Laterality Date  . ABDOMINAL HYSTERECTOMY  1958   menorrhagia  . BACK SURGERY    . BREAST BIOPSY Left 01/25/2012  . BREAST BIOPSY Right 04/08/2019   hear clip, [place] bx, path pending  . BREAST EXCISIONAL BIOPSY    . BREAST LUMPECTOMY WITH NEEDLE LOCALIZATION Right 05/12/2019   Procedure: BREAST LUMPECTOMY WITH NEEDLE LOCALIZATION;  Surgeon: Olean Ree, MD;  Location: [facility] ORS;  Service: General;  Laterality: Right;  . CATARACT EXTRACTION, BILATERAL Bilateral    1st 06/07/15 and the 2nd 06/21/15  . CHOLECYSTECTOMY    . COLONOSCOPY  2003   [facility]  . COLONOSCOPY WITH PROPOFOL N/A 07/02/2019   Procedure: COLONOSCOPY WITH PROPOFOL;  Surgeon: Virgel Manifold, MD;  Location: [facility] ENDOSCOPY;  Service: Endoscopy;  Laterality: N/A;  . COLONOSCOPY WITH PROPOFOL N/A 07/16/2019   Procedure: COLONOSCOPY WITH PROPOFOL;  Surgeon: Virgel Manifold, MD;  Location: [facility] ENDOSCOPY;  Service: Endoscopy;  Laterality: N/A;  . ESOPHAGOGASTRODUODENOSCOPY (EGD) WITH PROPOFOL N/A 07/02/2019   Procedure: ESOPHAGOGASTRODUODENOSCOPY (EGD) WITH PROPOFOL;  Surgeon: Virgel Manifold, MD;  Location: [facility] ENDOSCOPY;  Service: Endoscopy;  Laterality: N/A;  . EYE SURGERY    . KNEE SURGERY  2009,2011,2012   twice on right and once on left  . KNEE SURGERY    . LIPOMA EXCISION  1998  . [place]  2004  . TONSILLECTOMY     age of 37    Family History  Problem Relation Age of Onset  . Cancer Mother        skin cancer  . Stroke Mother   . Heart disease Father   . Breast cancer Neg Hx     Social History   Tobacco Use  . Smoking status: Never Smoker  . Smokeless tobacco:  Never Used  Substance Use Topics  . Alcohol use: No    Alcohol/week: 0.0 standard drinks     Current Outpatient Medications:  .  acetaminophen (TYLENOL) 500 MG tablet, Take 1 tablet (500 mg total) by mouth 3 (three) times daily. (Patient taking differently: Take 500 mg by mouth every 6 (six) hours as needed for moderate pain or headache.), Disp: 90 tablet, Rfl: 0 .  alendronate (FOSAMAX) 70 MG tablet, Take by mouth., Disp: , Rfl:  .  amLODipine (NORVASC) 10 MG tablet, TAKE 1 TABLET BY MOUTH EVERY DAY, Disp: 90 tablet, Rfl: 1 .  AZOPT 1 % ophthalmic suspension, Place 1 drop into both eyes in the morning and at bedtime. , Disp: , Rfl:  .  brimonidine-timolol (COMBIGAN) 0.2-0.5 % ophthalmic solution, Place 1 drop into both eyes 2 (two) times daily. , Disp: , Rfl:  .  Calcium Carbonate-Vitamin D (OYSTER SHELL  CALCIUM 500 + D PO), Take 1 tablet by mouth 2 (two) times a day., Disp: , Rfl:  .  denosumab (PROLIA) 60 MG/ML SOSY injection, Inject into the skin., Disp: , Rfl:  .  dorzolamide (TRUSOPT) 2 % ophthalmic solution, 1 drop 2 (two) times daily., Disp: , Rfl:  .  famotidine (PEPCID) 20 MG tablet, TAKE 1 TABLET BY MOUTH EVERY DAY, Disp: 90 tablet, Rfl: 1 .  ferrous sulfate 325 (65 FE) MG tablet, TAKE 1 TABLET BY MOUTH EVERY DAY WITH BREAKFAST, Disp: 90 tablet, Rfl: 1 .  fluticasone (FLONASE) 50 MCG/ACT nasal spray, SPRAY 2 SPRAYS INTO EACH NOSTRIL EVERY DAY, Disp: 48 mL, Rfl: 1 .  Lancets (ONETOUCH DELICA PLUS [id number]) MISC, 1 each by Does not apply route daily., Disp: 100 each, Rfl: 2 .  lubiprostone (AMITIZA) 24 MCG capsule, TAKE 1 CAPSULE (24 MCG TOTAL) BY MOUTH 2 (TWO) TIMES DAILY WITH A MEAL., Disp: 60 capsule, Rfl: 0 .  metFORMIN (GLUCOPHAGE-XR) 750 MG 24 hr tablet, TAKE 1 TABLET BY MOUTH EVERY DAY WITH BREAKFAST, Disp: 90 tablet, Rfl: 0 .  omega-3 acid ethyl esters (LOVAZA) 1 g capsule, TAKE 1 CAPSULE (1 G TOTAL) BY MOUTH 2 (TWO) TIMES DAILY., Disp: 180 capsule, Rfl: 1 .  ONETOUCH ULTRA  test strip, USE AS DIRECTED, Disp: 100 strip, Rfl: 12 .  oxyCODONE-acetaminophen (PERCOCET) 7.5-325 MG tablet, Take 1 tablet by mouth every 8 (eight) hours as needed for moderate pain or severe pain., Disp: 90 tablet, Rfl: 0 .  pravastatin (PRAVACHOL) 40 MG tablet, TAKE 1 TABLET BY MOUTH EVERYDAY AT BEDTIME, Disp: 90 tablet, Rfl: 1 .  ROCKLATAN 0.02-0.005 % SOLN, Apply 1 drop to eye at bedtime., Disp: , Rfl:   Allergies  Allergen Reactions  . Ace Inhibitors Cough    I personally reviewed active problem list, medication list, allergies, family history, social history, health maintenance, notes from last encounter with the patient/caregiver today.   ROS  Constitutional: Negative for fever or weight change.  Respiratory: Negative for cough and shortness of breath.   Cardiovascular: Negative for chest pain or palpitations.  Gastrointestinal: Negative for abdominal pain, no bowel changes.  Musculoskeletal: Negative for gait problem or joint swelling.  Skin: Negative for rash.  Neurological: Negative for dizziness or headache.  No other specific complaints in a complete review of systems (except as listed in HPI above).  Objective  Vitals:   04/18/20 1352  BP: 130/62  Pulse: 79  Resp: 16  Temp: 98.3 F (36.8 C)  TempSrc: Oral  SpO2: 99%  Weight: 185 lb (83.9 kg)  Height: 5\' 1"  (1.549 m)    Body mass index is 34.96 kg/m.  Physical Exam  Constitutional: Patient appears well-developed and well-nourished. Obese No distress.  HEENT: head atraumatic, normocephalic, pupils equal and reactive to light,neck supple Cardiovascular: Normal rate, regular rhythm and normal heart sounds.  No murmur heard. No BLE edema. Pulmonary/Chest: Effort normal and breath sounds normal. No respiratory distress. Abdominal: Soft.  There is no tenderness. Muscular skeletal: uses walker, crepitus with extension of both knees  Psychiatric: Patient has a normal mood and affect. behavior is normal.  Judgment and thought content normal.  Recent Results (from the past 2160 hour(s))  POCT HgB A1C     Status: Abnormal   Collection Time: 04/18/20  1:58 PM  Result Value Ref Range   Hemoglobin A1C 6.8 (A) 4.0 - 5.6 %   HbA1c POC (<> result, manual entry)     HbA1c, POC (prediabetic range)  HbA1c, POC (controlled diabetic range)        PHQ2/9: Depression screen [facility] 2/9 04/18/2020 12/14/2019 08/27/2019 08/14/2019 05/29/2019  Decreased Interest 0 0 0 0 0  Down, Depressed, Hopeless 0 0 0 0 0  PHQ - 2 Score 0 0 0 0 0  Altered sleeping - - - 0 0  Tired, decreased energy - - - 0 0  Change in appetite - - - 0 0  Feeling bad or failure about yourself  - - - 0 0  Trouble concentrating - - - 0 0  Moving slowly or fidgety/restless - - - 0 0  Suicidal thoughts - - - 0 0  PHQ-9 Score - - - 0 0  Difficult doing work/chores - - - - -  Some recent data might be hidden    phq 9 is negative   Fall Risk: Fall Risk  04/18/2020 12/14/2019 10/30/2019 08/14/2019 05/29/2019  Falls in the past year? 0 0 0 0 0  Comment - - - - -  Number falls in past yr: 0 0 - 0 0  Injury with Fall? 0 0 - 0 0  Risk Factor Category  - - - - -  Risk for fall due to : - - - - -  Follow up - - - - -    Functional Status Survey: Is the patient deaf or have difficulty hearing?: Yes Does the patient have difficulty seeing, even when wearing glasses/contacts?: Yes Does the patient have difficulty concentrating, remembering, or making decisions?: No Does the patient have difficulty walking or climbing stairs?: Yes Does the patient have difficulty dressing or bathing?: Yes Does the patient have difficulty doing errands alone such as visiting a doctor's office or shopping?: Yes    Assessment & Plan  1. Essential hypertension   2. Type 2 diabetes mellitus with microalbuminuria, without long-term current use of insulin (HCC)  - POCT HgB A1C - metFORMIN (GLUCOPHAGE-XR) 750 MG 24 hr tablet; Take 1 tablet (750 mg total) by  mouth daily with breakfast.  Dispense: 90 tablet; Refill: 1  3. Dyslipidemia associated with type 2 diabetes mellitus ([place])   4. Chronic bilateral low back pain without sciatica  - Ambulatory referral to [place]  5. Gait difficulty  - Ambulatory referral to Home Health  6. Aortic atherosclerosis (HCC)   7. Age-related osteoporosis without current pathological fracture   8. Chronic constipation   9. Morbid obesity ([place])  Discussed with the patient the risk posed by an increased BMI. Discussed importance of portion control, calorie counting and at least 150 minutes of physical activity weekly. Avoid sweet beverages and drink more water. Eat at least 6 servings of fruit and vegetables daily   10. Hyperparathyroidism ([place])   11. Stage 3a chronic kidney disease ([place])   12. Primary osteoarthritis involving multiple joints  - Ambulatory referral to [place]. GERD without esophagitis

## 2020-04-18 ENCOUNTER — Other Ambulatory Visit: Payer: Self-pay

## 2020-04-18 ENCOUNTER — Ambulatory Visit (INDEPENDENT_AMBULATORY_CARE_PROVIDER_SITE_OTHER): Payer: Medicare Other | Admitting: Family Medicine

## 2020-04-18 ENCOUNTER — Encounter: Payer: Self-pay | Admitting: Family Medicine

## 2020-04-18 VITALS — BP 130/62 | HR 79 | Temp 98.3°F | Resp 16 | Ht 61.0 in | Wt 185.0 lb

## 2020-04-18 DIAGNOSIS — I10 Essential (primary) hypertension: Secondary | ICD-10-CM

## 2020-04-18 DIAGNOSIS — K5909 Other constipation: Secondary | ICD-10-CM | POA: Diagnosis not present

## 2020-04-18 DIAGNOSIS — E1129 Type 2 diabetes mellitus with other diabetic kidney complication: Secondary | ICD-10-CM | POA: Diagnosis not present

## 2020-04-18 DIAGNOSIS — M545 Low back pain, unspecified: Secondary | ICD-10-CM

## 2020-04-18 DIAGNOSIS — M8949 Other hypertrophic osteoarthropathy, multiple sites: Secondary | ICD-10-CM

## 2020-04-18 DIAGNOSIS — E213 Hyperparathyroidism, unspecified: Secondary | ICD-10-CM

## 2020-04-18 DIAGNOSIS — M159 Polyosteoarthritis, unspecified: Secondary | ICD-10-CM

## 2020-04-18 DIAGNOSIS — E1169 Type 2 diabetes mellitus with other specified complication: Secondary | ICD-10-CM

## 2020-04-18 DIAGNOSIS — G8929 Other chronic pain: Secondary | ICD-10-CM

## 2020-04-18 DIAGNOSIS — R809 Proteinuria, unspecified: Secondary | ICD-10-CM

## 2020-04-18 DIAGNOSIS — I700 Atherosclerosis of aorta: Secondary | ICD-10-CM | POA: Diagnosis not present

## 2020-04-18 DIAGNOSIS — M810 Age-related osteoporosis without current pathological fracture: Secondary | ICD-10-CM

## 2020-04-18 DIAGNOSIS — E785 Hyperlipidemia, unspecified: Secondary | ICD-10-CM

## 2020-04-18 DIAGNOSIS — N1831 Chronic kidney disease, stage 3a: Secondary | ICD-10-CM | POA: Diagnosis not present

## 2020-04-18 DIAGNOSIS — K219 Gastro-esophageal reflux disease without esophagitis: Secondary | ICD-10-CM

## 2020-04-18 DIAGNOSIS — R269 Unspecified abnormalities of gait and mobility: Secondary | ICD-10-CM

## 2020-04-18 LAB — POCT GLYCOSYLATED HEMOGLOBIN (HGB A1C): Hemoglobin A1C: 6.8 % — AB (ref 4.0–5.6)

## 2020-04-18 MED ORDER — METFORMIN HCL ER 750 MG PO TB24: 750.0000 mg | ORAL_TABLET | Freq: Every day | ORAL | 1 refills | Status: DC

## 2020-04-18 NOTE — Patient Instructions (Signed)
Visit Information It was great speaking with you today!  Please let me know if you have any questions about our visit.  Goals Addressed            This Visit's Progress   . Monitor and Manage My Blood Sugar-Diabetes Type 2       Timeframe:  Long-Range Goal Priority:  High Start Date: 04/13/2020                            Expected End Date: 10/12/2020                      Follow Up Date 06/18/2020   - check blood sugar once daily before breakfast   - check blood sugar if I feel it is too high or too low - enter blood sugar readings and medication or insulin into daily log    Why is this important?    Checking your blood sugar at home helps to keep it from getting very high or very low.   Writing the results in a diary or log helps the doctor know how to care for you.   Your blood sugar log should have the time, date and the results.   Also, write down the amount of insulin or other medicine that you take.   Other information, like what you ate, exercise done and how you were feeling, will also be helpful.     Notes:        Patient Care Plan: General Pharmacy (Adult)    Problem Identified: Hypertension, Hyperlipidemia, Diabetes, Coronary Artery Disease, Chronic Kidney Disease, Osteoporosis and Osteoarthritis   Priority: High    Long-Range Goal: Patient-Specific Goal   Start Date: 04/13/2020  Expected End Date: 10/12/2020  This Visit's Progress: On track  Priority: High  Note:   Current Barriers:  . No barriers noted  Pharmacist Clinical Goal(s):  [name] Over the next 90 days, patient will maintain control of diabetes as evidenced by A1c less than 7%  through collaboration with PharmD and provider.   Interventions: . 1:1 collaboration with [name], MD regarding development and update of comprehensive plan of care as evidenced by provider attestation and co-signature . Inter-disciplinary care team collaboration (see longitudinal plan of care) . Comprehensive  medication review performed; medication list updated in electronic medical record  Hypertension (BP goal <140/90) -Not ideally controlled -Current treatment: . Amlodipine 10 mg daily  . Olmesartan 20 mg daily  -Medications previously tried: NA  -Current home readings: 136/67, 151/68, 151/76, 146/73, -Current dietary habits: Patient does not follow any specific dietary regimen -Current exercise habits: Patient does not follow any specific exercise regimen  -Denies hypotensive/hypertensive symptoms -Educated on Importance of home blood pressure monitoring; Proper BP monitoring technique; -Counseled to monitor BP at home weekly, document, and provide log at future appointments -Recommended to continue current medication  Hyperlipidemia: (LDL goal < 70) -Controlled -Current treatment: . Lovaza 1g twice daily  . Pravastatin 40 mg at bedtime  -Medications previously tried: NA  -Educated on Importance of limiting foods high in cholesterol; -Recommended to continue current medication  Diabetes (A1c goal <7%) -Controlled -Current medications: [name] Metformin XR 750 mg daily  -Medications previously tried: NA  -Current home glucose readings . fasting glucose: NA . post prandial glucose: 251, 180  . Pre meal: 150, 140s  -Denies hypoglycemic/hyperglycemic symptoms -Educated on A1c and blood sugar goals; Carbohydrate counting and/or plate method -Counseled to  check feet daily and get yearly eye exams -Recommended to continue current medication  Osteoporosis(Goal Maintain bone density and prevent fractures) -Controlled -Last DEXA Scan: 04/30/2017   T-Score femoral neck: -3.3  T-Score total hip: -1.9  T-Score lumbar spine: NA  T-Score forearm radius: -2.3  10-year probability of major osteoporotic fracture: NA  10-year probability of hip fracture: NA -Patient is a candidate for pharmacologic treatment due to T-Score < -2.5 in femoral neck -Current treatment  . Prolia 60 mg every 6  months  -Medications previously tried: NA  -Recommend weight-bearing and muscle strengthening exercises for building and maintaining bone density. -Recommended to continue current medication   Patient Goals/Self-Care Activities . Over the next 90 days, patient will:  - check glucose daily before breakfast, document, and provide at future appointments check blood pressure weekly, document, and provide at future appointments  Follow Up Plan: Telephone follow up appointment with care management team member scheduled for: 10/12/2020 at 2:00 PM      Patient agreed to services and verbal consent obtained.   The patient verbalized understanding of instructions, educational materials, and care plan provided today and declined offer to receive copy of patient instructions, educational materials, and care plan.   [facility] [phone]

## 2020-04-19 ENCOUNTER — Telehealth: Payer: Self-pay

## 2020-04-19 NOTE — Telephone Encounter (Signed)
Copied from [place] [id number]. Topic: General - Other >> Apr 19, 2020  9:12 AM Leward Quan A wrote: Reason for CRM: Ronalee Belts with [facility] ) called in to inquire of the person doing the referral need some clarity of the order for nursing. May also benefit from OT but there is a potential issue with the patients insurance. Asking for a call please  Ph# [phone] Fax# [phone]

## 2020-04-19 NOTE — Telephone Encounter (Signed)
Issues are based on Payer so if the referral comes through from the other patient then they will be able to accept this patient - if not then it is denied.  He needs clarity on this order - specifically on the nursing need.

## 2020-04-20 NOTE — Telephone Encounter (Signed)
This information will be relayed to [place].

## 2020-04-26 ENCOUNTER — Ambulatory Visit: Payer: Medicare Other | Attending: Anesthesiology | Admitting: Anesthesiology

## 2020-04-26 ENCOUNTER — Encounter: Payer: Self-pay | Admitting: Anesthesiology

## 2020-04-26 ENCOUNTER — Other Ambulatory Visit: Payer: Self-pay

## 2020-04-26 DIAGNOSIS — M545 Low back pain, unspecified: Secondary | ICD-10-CM

## 2020-04-26 DIAGNOSIS — M79641 Pain in right hand: Secondary | ICD-10-CM | POA: Diagnosis not present

## 2020-04-26 DIAGNOSIS — M546 Pain in thoracic spine: Secondary | ICD-10-CM | POA: Diagnosis not present

## 2020-04-26 DIAGNOSIS — M79642 Pain in left hand: Secondary | ICD-10-CM | POA: Diagnosis not present

## 2020-04-26 DIAGNOSIS — M25562 Pain in left knee: Secondary | ICD-10-CM

## 2020-04-26 DIAGNOSIS — M159 Polyosteoarthritis, unspecified: Secondary | ICD-10-CM | POA: Diagnosis not present

## 2020-04-26 DIAGNOSIS — Z79891 Long term (current) use of opiate analgesic: Secondary | ICD-10-CM | POA: Diagnosis not present

## 2020-04-26 DIAGNOSIS — M25561 Pain in right knee: Secondary | ICD-10-CM | POA: Diagnosis not present

## 2020-04-26 DIAGNOSIS — G8929 Other chronic pain: Secondary | ICD-10-CM | POA: Diagnosis not present

## 2020-04-26 DIAGNOSIS — G894 Chronic pain syndrome: Secondary | ICD-10-CM | POA: Diagnosis not present

## 2020-04-26 MED ORDER — OXYCODONE-ACETAMINOPHEN 7.5-325 MG PO TABS: 1.0000 | ORAL_TABLET | Freq: Three times a day (TID) | ORAL | 0 refills | Status: AC | PRN

## 2020-04-26 MED ORDER — OXYCODONE-ACETAMINOPHEN 7.5-325 MG PO TABS
1.0000 | ORAL_TABLET | Freq: Three times a day (TID) | ORAL | 0 refills | Status: AC | PRN
Start: 2020-05-08 — End: 2020-06-07

## 2020-04-26 NOTE — Progress Notes (Signed)
Virtual Visit via Telephone Note  I connected with Taylor [name] on 04/26/20 at  1:30 PM EST by telephone and verified that I am speaking with the correct person using two identifiers.  Location: Patient: Home Provider: Pain control center   I discussed the limitations, risks, security and privacy concerns of performing an evaluation and management service by telephone and the availability of in person appointments. I also discussed with the patient that there may be a patient responsible charge related to this service. The patient expressed understanding and agreed to proceed.   History of Present Illness: I spoke with Taylor [name] today via telephone as she was unable to do the video portion of the virtual conference.  She reports that she is doing well with her low back pain and hip and knee pain.  She is taking her medications as prescribed and generally gets 75% relief lasting about 4 to 6 hours before she has recurrence of the same quality characteristic and distribution of pain.  It is generally well controlled and she is staying reasonably well managed with pain during the day and sleeps well at night with the medication.  No side effects reported.  Otherwise she is in her usual state of health try to do her exercises and stretching as tolerated and trying to stay active with the current Covid limitations.  No other changes are reported today.   Observations/Objective:   Current Outpatient Medications:  .  [START ON 06/07/2020] oxyCODONE-acetaminophen (PERCOCET) 7.5-325 MG tablet, Take 1 tablet by mouth every 8 (eight) hours as needed for moderate pain or severe pain., Disp: 90 tablet, Rfl: 0 .  acetaminophen (TYLENOL) 500 MG tablet, Take 1 tablet (500 mg total) by mouth 3 (three) times daily. (Patient taking differently: Take 500 mg by mouth every 6 (six) hours as needed for moderate pain or headache.), Disp: 90 tablet, Rfl: 0 .  alendronate (FOSAMAX) 70 MG tablet, Take by mouth., Disp:  , Rfl:  .  amLODipine (NORVASC) 10 MG tablet, TAKE 1 TABLET BY MOUTH EVERY DAY, Disp: 90 tablet, Rfl: 1 .  AZOPT 1 % ophthalmic suspension, Place 1 drop into both eyes in the morning and at bedtime. , Disp: , Rfl:  .  brimonidine-timolol (COMBIGAN) 0.2-0.5 % ophthalmic solution, Place 1 drop into both eyes 2 (two) times daily. , Disp: , Rfl:  .  Calcium Carbonate-Vitamin D (OYSTER SHELL CALCIUM 500 + D PO), Take 1 tablet by mouth 2 (two) times a day., Disp: , Rfl:  .  denosumab (PROLIA) 60 MG/ML SOSY injection, Inject into the skin., Disp: , Rfl:  .  dorzolamide (TRUSOPT) 2 % ophthalmic solution, 1 drop 2 (two) times daily., Disp: , Rfl:  .  famotidine (PEPCID) 20 MG tablet, TAKE 1 TABLET BY MOUTH EVERY DAY, Disp: 90 tablet, Rfl: 1 .  ferrous sulfate 325 (65 FE) MG tablet, TAKE 1 TABLET BY MOUTH EVERY DAY WITH BREAKFAST, Disp: 90 tablet, Rfl: 1 .  fluticasone (FLONASE) 50 MCG/ACT nasal spray, SPRAY 2 SPRAYS INTO EACH NOSTRIL EVERY DAY, Disp: 48 mL, Rfl: 1 .  Lancets (ONETOUCH DELICA PLUS [id number]) MISC, 1 each by Does not apply route daily., Disp: 100 each, Rfl: 2 .  lubiprostone (AMITIZA) 24 MCG capsule, TAKE 1 CAPSULE (24 MCG TOTAL) BY MOUTH 2 (TWO) TIMES DAILY WITH A MEAL., Disp: 60 capsule, Rfl: 0 .  metFORMIN (GLUCOPHAGE-XR) 750 MG 24 hr tablet, Take 1 tablet (750 mg total) by mouth daily with breakfast., Disp: 90 tablet, Rfl: 1 .  omega-3 acid ethyl esters (LOVAZA) 1 g capsule, TAKE 1 CAPSULE (1 G TOTAL) BY MOUTH 2 (TWO) TIMES DAILY., Disp: 180 capsule, Rfl: 1 .  ONETOUCH ULTRA test strip, USE AS DIRECTED, Disp: 100 strip, Rfl: 12 .  [START ON 05/08/2020] oxyCODONE-acetaminophen (PERCOCET) 7.5-325 MG tablet, Take 1 tablet by mouth every 8 (eight) hours as needed for moderate pain or severe pain., Disp: 90 tablet, Rfl: 0 .  pravastatin (PRAVACHOL) 40 MG tablet, TAKE 1 TABLET BY MOUTH EVERYDAY AT BEDTIME, Disp: 90 tablet, Rfl: 1 .  ROCKLATAN 0.02-0.005 % SOLN, Apply 1 drop to eye at bedtime.,  Disp: , Rfl:  Assessment and Plan: 1. Chronic pain syndrome   2. Chronic pain of right knee   3. Pain in both hands   4. Chronic midline low back pain without sciatica   5. Long term prescription opiate use   6. Chronic pain of both knees   7. Osteoarthritis of multiple joints, unspecified osteoarthritis type   8. Acute left-sided thoracic back pain   Based on discussion today upon review of the [facility] practitioner database information I am refilling her medications from March 20 in April 19 with no other changes initiated in her pain control situation.  We will have her scheduled for 75-month return to clinic.  We talked about stretching and aerobic activity today as well and I encouraged her to continue follow-up with her primary care physicians and to contact us should she have any problems with her pain management in the meantime.  She will be scheduled for routine UDS in the meantime as well.  Follow Up Instructions:    I discussed the assessment and treatment plan with the patient. The patient was provided an opportunity to ask questions and all were answered. The patient agreed with the plan and demonstrated an understanding of the instructions.   The patient was advised to call back or seek an in-person evaluation if the symptoms worsen or if the condition fails to improve as anticipated.  I provided 30 minutes of non-face-to-face time during this encounter.   [name], MD

## 2020-04-28 ENCOUNTER — Ambulatory Visit: Payer: Medicare Other

## 2020-04-29 ENCOUNTER — Telehealth: Payer: Self-pay | Admitting: Family Medicine

## 2020-04-29 NOTE — Telephone Encounter (Signed)
This is not our patient.

## 2020-04-29 NOTE — Telephone Encounter (Signed)
Pt is calling to ask Dr. Ancil Boozer if an order has been placed for PT for the pt to come to her home? Please advise [phone]

## 2020-05-02 ENCOUNTER — Telehealth: Payer: Self-pay

## 2020-05-02 NOTE — Telephone Encounter (Signed)
Copied from [place] [id number]. Topic: General - Other >> May 02, 2020 10:29 AM Tessa Lerner A wrote: Reason for CRM: Patient would like to be contacted regarding emotional wellness Patient would like to discuss their options for in-home therapeutic assistance  Please contact patient to discuss further

## 2020-05-03 ENCOUNTER — Other Ambulatory Visit: Payer: Self-pay | Admitting: Family Medicine

## 2020-05-03 DIAGNOSIS — K5909 Other constipation: Secondary | ICD-10-CM

## 2020-05-03 DIAGNOSIS — E1129 Type 2 diabetes mellitus with other diabetic kidney complication: Secondary | ICD-10-CM

## 2020-05-03 DIAGNOSIS — E782 Mixed hyperlipidemia: Secondary | ICD-10-CM

## 2020-05-03 DIAGNOSIS — E1142 Type 2 diabetes mellitus with diabetic polyneuropathy: Secondary | ICD-10-CM

## 2020-05-03 DIAGNOSIS — R809 Proteinuria, unspecified: Secondary | ICD-10-CM

## 2020-05-03 DIAGNOSIS — E1169 Type 2 diabetes mellitus with other specified complication: Secondary | ICD-10-CM

## 2020-05-03 DIAGNOSIS — I10 Essential (primary) hypertension: Secondary | ICD-10-CM

## 2020-05-03 NOTE — Telephone Encounter (Signed)
Requested Prescriptions  Pending Prescriptions Disp Refills  . lubiprostone (AMITIZA) 24 MCG capsule [Pharmacy Med Name: LUBIPROSTONE 24 MCG CAPSULE] 90 capsule 1    Sig: TAKE 1 CAPSULE (24 MCG TOTAL) BY MOUTH 2 (TWO) TIMES DAILY WITH A MEAL.     Gastroenterology: Irritable Bowel Syndrome Passed - 05/03/2020  7:51 PM      Passed - Valid encounter within last 12 months    Recent Outpatient Visits          2 weeks ago Essential hypertension   [facility] [place], Drue Stager, MD   4 months ago Type 2 diabetes mellitus with microalbuminuria, without long-term current use of insulin [facility])   [facility] [place], Drue Stager, MD   8 months ago Type 2 diabetes mellitus with microalbuminuria, without long-term current use of insulin [facility])   [facility] [place], Drue Stager, MD   11 months ago Other iron deficiency anemia   [facility] [place], Drue Stager, MD   1 year ago Type 2 diabetes mellitus with microalbuminuria, without long-term current use of insulin [facility])   [facility] [name], MD      Future Appointments            In 2 days  [place]   In 3 months [name], MD [facility], [facility]           . metFORMIN (GLUCOPHAGE-XR) 750 MG 24 hr tablet [Pharmacy Med Name: METFORMIN HCL ER 750 MG TABLET] 180 tablet 1    Sig: TAKE 2 TABLETS (1,500 MG TOTAL) BY MOUTH DAILY WITH BREAKFAST.     Endocrinology:  Diabetes - Biguanides Failed - 05/03/2020  7:51 PM      Failed - Cr in normal range and within 360 days    Creat  Date Value Ref Range Status  12/28/2019 1.35 (H) 0.60 - 0.88 mg/dL Final    Comment:    For patients >59 years of age, the reference limit for Creatinine is approximately 13% higher for people identified as African-American. .    Creatinine, Urine  Date Value Ref Range Status  04/17/2019 108 20 - 275 mg/dL Final         Failed -  AA eGFR in normal range and within 360 days    GFR, Est African American  Date Value Ref Range Status  12/28/2019 42 (L) > OR = 60 mL/min/1.62m Final   GFR, Est Non African American  Date Value Ref Range Status  12/28/2019 36 (L) > OR = 60 mL/min/1.746mFinal         Passed - HBA1C is between 0 and 7.9 and within 180 days    Hemoglobin A1C  Date Value Ref Range Status  04/18/2020 6.8 (A) 4.0 - 5.6 % Final   HbA1c, POC (prediabetic range)  Date Value Ref Range Status  08/05/2017 7.8 (A) 5.7 - 6.4 % Final   HbA1c, POC (controlled diabetic range)  Date Value Ref Range Status  04/17/2019 6.5 0.0 - 7.0 % Final         Passed - Valid encounter within last 6 months    Recent Outpatient Visits          2 weeks ago [facility]ential [facility]   [facility]   4 months ago Type 2 diabetes mellitus with microalbuminuria, without long-term current use of insulin [facility]  [facility]  8 months ago Type 2 diabetes mellitus with microalbuminuria, without long-term current use of insulin [facility])   [facility] [place], Drue Stager, MD   11 months ago Other iron deficiency anemia   [facility] [place], Drue Stager, MD   1 year ago Type 2 diabetes mellitus with microalbuminuria, without long-term current use of insulin [facility])   [facility] [name], MD      Future Appointments            In 2 days  [facility], [place]   In 3 months [name], MD [facility], [place]           . omega-3 acid ethyl esters (LOVAZA) 1 g capsule [Pharmacy Med Name: OMEGA-3 ETHYL ESTERS 1 GM CAP] 180 capsule 1    Sig: TAKE 1 CAPSULE BY MOUTH 2 TIMES DAILY.     Endocrinology:  Nutritional Agents Passed - 05/03/2020  7:51 PM      Passed - Valid encounter within last 12 months    Recent Outpatient Visits          2 weeks ago Essential  hypertension   [facility] [place], Drue Stager, MD   4 months ago Type 2 diabetes mellitus with microalbuminuria, without long-term current use of insulin [facility])   [facility] [place], Drue Stager, MD   8 months ago Type 2 diabetes mellitus with microalbuminuria, without long-term current use of insulin [facility])   [facility] [name], MD   11 months ago Other iron deficiency anemia   [facility] [place], Drue Stager, MD   1 year ago Type 2 diabetes mellitus with microalbuminuria, without long-term current use of insulin [facility])   [facility] [name], MD      Future Appointments            In 2 days  [facility], [place]   In 3 months [name], MD [facility], [place]           . olmesartan (BENICAR) 20 MG tablet [Pharmacy Med Name: OLMESARTAN MEDOXOMIL 20 MG TAB] 90 tablet 1    Sig: TAKE 1 TABLET BY MOUTH EVERY DAY IN THE MORNING     Cardiovascular:  Angiotensin Receptor Blockers Failed - 05/03/2020  7:51 PM      Failed - Cr in normal range and within 180 days    Creat  Date Value Ref Range Status  12/28/2019 1.35 (H) 0.60 - 0.88 mg/dL Final    Comment:    For patients >70 years of age, the reference limit for Creatinine is approximately 13% higher for people identified as African-American. .    Creatinine, Urine  Date Value Ref Range Status  04/17/2019 108 20 - 275 mg/dL Final         Passed - K in normal range and within 180 days    Potassium  Date Value Ref Range Status  12/28/2019 5.1 3.5 - 5.3 mmol/L Final         Passed - Patient is not pregnant      Passed - Last BP in normal range    BP Readings from Last 1 Encounters:  04/18/20 130/62         Passed - Valid encounter within last 6 months    Recent Outpatient Visits          2 weeks ago Essential hypertension   [name], MD   4  months ago Type 2 diabetes mellitus with microalbuminuria, without long-term current use of insulin [facility])   [facility] [place], Drue Stager, MD   8 months ago Type 2 diabetes mellitus with microalbuminuria, without long-term current use of insulin [facility])   [facility] [place], Drue Stager, MD   11 months ago Other iron deficiency anemia   [facility] [place], Drue Stager, MD   1 year ago Type 2 diabetes mellitus with microalbuminuria, without long-term current use of insulin [facility])   [facility] [name], MD      Future Appointments            In 2 days  [facility] [place]   In 3 months [name], MD [facility]           . amLODipine ([place]) 10 MG tablet [Pharmacy Med Name: AMLODIPINE BESYLATE 10 MG TAB] 90 tablet 1    Sig: TAKE 1 TABLET BY MOUTH EVERY DAY     Cardiovascular:  Calcium Channel Blockers Passed - 05/03/2020  7:51 PM      Passed - Last BP in normal range    BP Readings from Last 1 Encounters:  04/18/20 130/62         Passed - Valid encounter within last 6 months    Recent Outpatient Visits          2 weeks ago Essential hypertension   [facility] [place], Drue Stager, MD   4 months ago Type 2 diabetes mellitus with microalbuminuria, without long-term current use of insulin [facility])   [facility] [place], Drue Stager, MD   8 months ago Type 2 diabetes mellitus with microalbuminuria, without long-term current use of insulin [facility])   [facility] [place], Drue Stager, MD   11 months ago Other iron deficiency anemia   [facility] [place], Drue Stager, MD   1 year ago Type 2 diabetes mellitus with microalbuminuria, without long-term current use of insulin [facility])   [facility] [name], MD      Future Appointments            In 2 days  [facility], [place]   In 3 months [name], MD [facility], [place]           . ONETOUCH ULTRA test strip [employer] Med Name: [place] TEST STRP] 100 strip 12    Sig: USE AS DIRECTED     Endocrinology: Diabetes - Testing Supplies Passed - 05/03/2020  7:51 PM      Passed - Valid encounter within last 12 months    Recent Outpatient Visits          2 weeks ago Essential hypertension   [facility] [place], Drue Stager, MD   4 months ago Type 2 diabetes mellitus with microalbuminuria, without long-term current use of insulin [facility])   [facility] [place], Drue Stager, MD   8 months ago Type 2 diabetes mellitus with microalbuminuria, without long-term current use of insulin [facility])   [facility] [place], Drue Stager, MD   11 months ago Other iron deficiency anemia   [facility] [place], Drue Stager, MD   1 year ago Type 2 diabetes mellitus with microalbuminuria, without long-term current use of insulin [facility])   [facility] [name], MD  Future Appointments            In 2 days  [facility], [place]   In 3 months [facility], Drue Stager, MD [facility], [facility]

## 2020-05-03 NOTE — Telephone Encounter (Signed)
Requested medications are due for refill today. Unknown  Requested medications are on the active medications list. No. It appears that medication was d/c  Last refill. 04/18/2020  Future visit scheduled.   yes  Notes to clinic.  It appears that medication was d/c.

## 2020-05-04 ENCOUNTER — Telehealth: Payer: Self-pay

## 2020-05-05 ENCOUNTER — Ambulatory Visit (INDEPENDENT_AMBULATORY_CARE_PROVIDER_SITE_OTHER): Payer: Medicare Other

## 2020-05-05 DIAGNOSIS — Z0000 Encounter for general adult medical examination without abnormal findings: Secondary | ICD-10-CM | POA: Diagnosis not present

## 2020-05-05 NOTE — Progress Notes (Signed)
Subjective:   CASIA CORTI is a 87 y.o. female who presents for Medicare Annual (Subsequent) preventive examination.  Virtual Visit via Telephone Note  I connected with  [name] on 05/05/20 at 11:20 AM EDT by telephone and verified that I am speaking with the correct person using two identifiers.  Location: Patient: home Provider: [place] Persons participating in the virtual visit: [place]   I discussed the limitations, risks, security and privacy concerns of performing an evaluation and management service by telephone and the availability of in person appointments. The patient expressed understanding and agreed to proceed.  Interactive audio and video telecommunications were attempted between this nurse and patient, however failed, due to patient having technical difficulties OR patient did not have access to video capability.  We continued and completed visit with audio only.  Some vital signs may be absent or patient reported.   [name], LPN    Review of Systems     Cardiac Risk Factors include: advanced age (>26men, >31 women);diabetes mellitus;dyslipidemia;hypertension;obesity (BMI >30kg/m2);sedentary lifestyle     Objective:    There were no vitals filed for this visit. There is no height or weight on file to calculate BMI.  Advanced Directives 05/05/2020 07/16/2019 07/02/2019 05/12/2019 05/04/2019 04/28/2019 12/30/2018  Does Patient Have a Medical Advance Directive? No No Yes Yes Yes Yes Yes  Type of Advance Directive - - - Living will;Healthcare Power of [place];Living will [place];Living will  Does patient want to make changes to medical advance directive? - - - No - Patient declined - - No - Patient declined  Copy of [place] in Chart? - - - No - copy requested - No - copy requested No - copy requested  Would patient like information on creating a medical advance  directive? Yes (MAU/Ambulatory/Procedural Areas - Information given) - - - - - -    Current Medications (verified) Outpatient Encounter Medications as of 05/05/2020  Medication Sig  . acetaminophen (TYLENOL) 500 MG tablet Take 1 tablet (500 mg total) by mouth 3 (three) times daily. (Patient taking differently: Take 500 mg by mouth every 6 (six) hours as needed for moderate pain or headache.)  . alendronate (FOSAMAX) 70 MG tablet Take by mouth.  [name] amLODipine (NORVASC) 10 MG tablet TAKE 1 TABLET BY MOUTH EVERY DAY  . brimonidine-timolol (COMBIGAN) 0.2-0.5 % ophthalmic solution Place 1 drop into both eyes 2 (two) times daily.   . Calcium Carbonate-Vitamin D (OYSTER SHELL CALCIUM 500 + D PO) Take 1 tablet by mouth 2 (two) times a day.  . denosumab (PROLIA) 60 MG/ML SOSY injection Inject into the skin.  [name] dorzolamide (TRUSOPT) 2 % ophthalmic solution 1 drop 2 (two) times daily.  . famotidine (PEPCID) 20 MG tablet TAKE 1 TABLET BY MOUTH EVERY DAY  . ferrous sulfate 325 (65 FE) MG tablet TAKE 1 TABLET BY MOUTH EVERY DAY WITH BREAKFAST  . fluticasone (FLONASE) 50 MCG/ACT nasal spray SPRAY 2 SPRAYS INTO EACH NOSTRIL EVERY DAY  . Lancets (ONETOUCH DELICA PLUS [id number]) MISC 1 each by Does not apply route daily.  [name] lubiprostone (AMITIZA) 24 MCG capsule TAKE 1 CAPSULE (24 MCG TOTAL) BY MOUTH 2 (TWO) TIMES DAILY WITH A MEAL.  . metFORMIN (GLUCOPHAGE-XR) 750 MG 24 hr tablet TAKE 2 TABLETS (1,500 MG TOTAL) BY MOUTH DAILY WITH BREAKFAST.  [name] omega-3 acid ethyl esters (LOVAZA) 1 g capsule TAKE 1 CAPSULE BY MOUTH 2 TIMES DAILY.  [name]  ONETOUCH ULTRA test strip USE AS DIRECTED  . [START ON 05/08/2020] oxyCODONE-acetaminophen (PERCOCET) 7.5-325 MG tablet Take 1 tablet by mouth every 8 (eight) hours as needed for moderate pain or severe pain.  . pravastatin (PRAVACHOL) 40 MG tablet TAKE 1 TABLET BY MOUTH EVERYDAY AT BEDTIME  . ROCKLATAN 0.02-0.005 % SOLN Apply 1 drop to eye at bedtime.  Derrill Memo ON 06/07/2020]  oxyCODONE-acetaminophen (PERCOCET) 7.5-325 MG tablet Take 1 tablet by mouth every 8 (eight) hours as needed for moderate pain or severe pain.  . [DISCONTINUED] AZOPT 1 % ophthalmic suspension Place 1 drop into both eyes in the morning and at bedtime.    No facility-administered encounter medications on file as of 05/05/2020.    Allergies (verified) Ace inhibitors   History: Past Medical History:  Diagnosis Date  . Abnormal mammogram, unspecified 2013  . Anemia   . Arthritis   . Breast screening, unspecified 2013  . Diabetes mellitus without complication ([place]) 4431   non insulin dependent  . Dysrhythmia    IRREGULAR HEART BEAT  . GERD (gastroesophageal reflux disease)   . Glaucoma 2003  . Gout   . History of kidney stones    H/O  . Hyperlipidemia 2008  . Hypertension 1980's  . Lump or mass in breast 01/03/2012   left breast  . Obesity, unspecified 2013  . Osteoporosis   . Shingles 2013  . Special screening for malignant neoplasms, colon 2013   Past Surgical History:  Procedure Laterality Date  . ABDOMINAL HYSTERECTOMY  1958   menorrhagia  . BACK SURGERY    . BREAST BIOPSY Left 01/25/2012  . BREAST BIOPSY Right 04/08/2019   hear clip, [place] bx, path pending  . BREAST EXCISIONAL BIOPSY    . BREAST LUMPECTOMY WITH NEEDLE LOCALIZATION Right 05/12/2019   Procedure: BREAST LUMPECTOMY WITH NEEDLE LOCALIZATION;  Surgeon: Olean Ree, MD;  Location: [facility] ORS;  Service: General;  Laterality: Right;  . CATARACT EXTRACTION, BILATERAL Bilateral    1st 06/07/15 and the 2nd 06/21/15  . CHOLECYSTECTOMY    . COLONOSCOPY  2003   [facility]  . COLONOSCOPY WITH PROPOFOL N/A 07/02/2019   Procedure: COLONOSCOPY WITH PROPOFOL;  Surgeon: Virgel Manifold, MD;  Location: [facility] END[facility]COPY;  Service: Endoscopy;  Laterality: N/A;  . COLONOSCOPY WITH PROPOFOL N/A 07/16/2019   Procedure: COLONOSCOPY WITH PROPOFOL;  Surgeon: Virgel Manifold, MD;  Location: [facility] ENDOSCOPY;  Service: Endoscopy;   Laterality: N/A;  . ESOPHAGOGASTRODUODENOSCOPY (EGD) WITH PROPOFOL N/A 07/02/2019   Procedure: ESOPHAGOGASTRODUODENOSCOPY (EGD) WITH PROPOFOL;  Surgeon: Virgel Manifold, MD;  Location: [facility] ENDOSCOPY;  Service: Endoscopy;  Laterality: N/A;  . EYE SURGERY    . KNEE SURGERY  2009,2011,2012   twice on right and once on left  . KNEE SURGERY    . LIPOMA EXCISION  1998  . [place]  2004  . TONSILLECTOMY     age of 61   Family History  Problem Relation Age of Onset  . Cancer Mother        skin cancer  . Stroke Mother   . Heart disease Father   . Breast cancer Neg Hx    Social History   Socioeconomic History  . Marital status: Divorced    Spouse name: Not on file  . Number of children: 4  . Years of education: Not on file  . Highest education level: Some college, no degree  Occupational History  . 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 Smoking status: Never Smoker  . Smokeless tobacco: Never Used  Vaping Use  . Vaping Use: Never used  Substance and Sexual Activity  . Alcohol use: No    Alcohol/week: 0.0 standard drinks  . Drug use: No  . Sexual activity: Not Currently  Other Topics Concern  . Not on file  Social History Narrative   Lives alone   Children do not live in town   Friend helps out: Cira Rue - [phone]   Independent prior to her fall 10/2016   Social Determinants of Health   Financial Resource Strain: Low Risk   . Difficulty of Paying Living Expenses: Not hard at all  Food Insecurity: No Food Insecurity  . Worried About Charity fundraiser in the Last Year: Never true  . Ran Out of Food in the Last Year: Never true  Transportation Needs: No Transportation Needs  . Lack of Transportation (Medical): No  . Lack of Transportation (Non-Medical): No  Physical Activity: Inactive  . Days of Exercise per Week: 0 days  . Minutes of Exercise per Session: 0 min  Stress: No Stress Concern Present   . Feeling of Stress : Not at all  Social Connections: Moderately Isolated  . Frequency of Communication with Friends and Family: More than three times a week  . Frequency of Social Gatherings with Friends and Family: Three times a week  . Attends Religious Services: More than 4 times per year  . Active Member of Clubs or Organizations: No  . Attends Archivist Meetings: Never  . Marital Status: Divorced    Tobacco Counseling Counseling given: Not Answered   Clinical Intake:  Pre-visit preparation completed: Yes  Pain : No/denies pain     Nutritional Risks: None Diabetes: Yes CBG done?: No Did pt. bring in CBG monitor from home?: No  How often do you need to have someone help you when you read instructions, pamphlets, or other written materials from your doctor or pharmacy?: 1 - Never  Nutrition Risk Assessment:  Has the patient had any N/V/D within the last 2 months?  No  Does the patient have any non-healing wounds?  No  Has the patient had any unintentional weight loss or weight gain?  No   Diabetes:  Is the patient diabetic?  Yes  If diabetic, was a CBG obtained today?  No  Did the patient bring in their glucometer from home?  No  How often do you monitor your CBG's? 2-3 times per week .   Financial Strains and Diabetes Management:  Are you having any financial strains with the device, your supplies or your medication? No .  Does the patient want to be seen by Chronic Care Management for management of their diabetes?  No  Would the patient like to be referred to a Nutritionist or for Diabetic Management?  No   Diabetic Exams:  Diabetic Eye Exam: Completed per patient Dr. Katy Fitch; need records.  Diabetic Foot Exam: Completed 12/14/19.   Interpreter Needed?: No  Information entered by :: [name] LPN   Activities of Daily Living In your present state of health, do you have any difficulty performing the following activities: 05/05/2020 04/18/2020   Hearing? Tempie Donning  Vision? Y Y  Comment - -  Difficulty concentrating or making decisions? N N  Walking or climbing stairs? Y Y  Dressing or bathing? Y Y  Comment - -  Doing errands, shopping? Tempie Donning  Conservation officer, nature and  eating ? N -  Using the Toilet? N -  In the past six months, have you accidently leaked urine? N -  Do you have problems with loss of bowel control? N -  Managing your Medications? N -  Managing your Finances? N -  Housekeeping or managing your Housekeeping? N -  Some recent data might be hidden    Patient Care Team: [name], MD as PCP - General (Family Medicine) Andree Elk Alvina Filbert, MD as Consulting Physician (Anesthesiology) [name], DPM as Consulting Physician (Podiatry) [name], MD (Nephrology) Virgel Manifold, MD as Consulting Physician (Gastroenterology) Germaine Pomfret, [facility] (Pharmacist)  Indicate any recent Medical Services you may have received from other than [facility] providers in the past year (date may be approximate).     Assessment:   This is a routine wellness examination for Taylor [name].  Hearing/Vision screen  Hearing Screening   125Hz  250Hz  500Hz  1000Hz  2000Hz  3000Hz  4000Hz  6000Hz  8000Hz   Right ear:           Left ear:           Comments: Hearing aids maintained by [facility] [place] Screening Comments: Annual vision screenings Dr. Maggie Font; pt only has left eye  Dietary issues and exercise activities discussed: Current Exercise Habits: The patient does not participate in regular exercise at present, Exercise limited by: orthopedic condition(s)  Goals    .  "I need some help in the home set up" (pt-stated)      [place] (see longitudinal plan of care for additional care plan information)  Current Barriers:  . ADL IADL limitations  Clinical Social Work Clinical Goal(s):  [name] Over the next 90 days, patient will work with Administrator, Civil Service to address needs related to in home  help  Interventions: . Return phone call from patient to follow up call to patient to discuss need for in home support . Patient discussed being  ill for the last month and has been staying with her daughter in [place]-extreme constipation reported . Patient further confirmed that her daughter is there but is only able to provide limited support . Patient's son is currently working on coordinating a private duty aid for patient, however for now will be staying with her daughter in [place], [place] . Discussed plans with patient for ongoing care management follow up and provided patient with direct contact information for care management team  Patient Self Care Activities:  . Patient verbalizes understanding of plan to discuss private duty aid options with her family before a decision is made . Unable to perform ADLs independently . Unable to perform IADLs independently  Please see past updates related to this goal by clicking on the "Past Updates" button in the selected goal      .  DIET - INCREASE WATER INTAKE      Recommend drinking 6-8 glasses of water per day    .  Monitor and Manage My Blood Sugar-Diabetes Type 2      Timeframe:  Long-Range Goal Priority:  High Start Date: 04/13/2020                            Expected End Date: 10/12/2020                      Follow Up Date 06/18/2020   - check blood sugar once daily before breakfast   - check  blood sugar if I feel it is too high or too low - enter blood sugar readings and medication or insulin into daily log    Why is this important?    Checking your blood sugar at home helps to keep it from getting very high or very low.   Writing the results in a diary or log helps the doctor know how to care for you.   Your blood sugar log should have the time, date and the results.   Also, write down the amount of insulin or other medicine that you take.   Other information, like what you ate, exercise done and how you were feeling,  will also be helpful.     Notes:       Depression Screen PHQ 2/9 Scores 05/05/2020 04/18/2020 12/14/2019 08/27/2019 08/14/2019 05/29/2019 04/28/2019  PHQ - 2 Score 0 0 0 0 0 0 0  PHQ- 9 Score - - - - 0 0 -  Exception Documentation - - - - - - -    Fall Risk Fall Risk  05/05/2020 04/18/2020 12/14/2019 10/30/2019 08/14/2019  Falls in the past year? 0 0 0 0 0  Comment - - - - -  Number falls in past yr: 0 0 0 - 0  Injury with Fall? 0 0 0 - 0  Risk Factor Category  - - - - -  Risk for fall due to : Impaired balance/gait;Impaired mobility;Impaired vision - - - -  Follow up Falls prevention discussed - - - -    FALL RISK PREVENTION PERTAINING TO THE HOME:  Any stairs in or around the home? No  If so, are there any without handrails? No  Home free of loose throw rugs in walkways, pet beds, electrical cords, etc? Yes  Adequate lighting in your home to reduce risk of falls? Yes   ASSISTIVE DEVICES UTILIZED TO PREVENT FALLS:  Life alert? Yes  Use of a cane, walker or w/c? Yes  Grab bars in the bathroom? Yes  Shower chair or bench in shower? Yes  Elevated toilet seat or a handicapped toilet? Yes   TIMED UP AND GO:  Was the test performed? No . Telephonic visit.   Cognitive Function: Normal cognitive status assessed by direct observation by this Nurse Health Advisor. No abnormalities found.       6CIT Screen 04/28/2019 04/08/2018  What Year? 0 points 0 points  What month? 0 points 0 points  What time? 0 points 0 points  Count back from 20 0 points 0 points  Months in reverse 0 points 0 points  Repeat phrase 0 points 0 points  Total Score 0 0    Immunizations Immunization History  Administered Date(s) Administered  . Fluad Quad(high Dose 65+) 12/15/2018, 12/28/2019  . Influenza, High Dose Seasonal PF 11/10/2014, 11/17/2015, 11/23/2016, 12/06/2017  . Influenza-Unspecified 10/20/2012, 11/10/2014  . Moderna Sars-Covid-2 Vaccination 11/12/2019, 12/10/2019  . Pneumococcal Conjugate-13  05/12/2013, 08/05/2015  . Tdap 10/19/2010    TDAP status: Up to date  Flu Vaccine status: Up to date  Pneumococcal vaccine status: Due, Education has been provided regarding the importance of this vaccine. Advised may receive this vaccine at local pharmacy or Health Dept. Aware to provide a copy of the vaccination record if obtained from local pharmacy or Health Dept. Verbalized acceptance and understanding.  Covid-19 vaccine status: Completed vaccines  Qualifies for Shingles Vaccine? Yes   Zostavax completed No   Shingrix Completed?: No.    Education has been provided regarding the  importance of this vaccine. Patient has been advised to call insurance company to determine out of pocket expense if they have not yet received this vaccine. Advised may also receive vaccine at local pharmacy or Health Dept. Verbalized acceptance and understanding.  Screening Tests Health Maintenance  Topic Date Due  . OPHTHALMOLOGY EXAM  08/04/2019  . URINE MICROALBUMIN  04/16/2020  . COVID-19 Vaccine (3 - Booster for Moderna series) 06/09/2020  . HEMOGLOBIN A1C  10/16/2020  . TETANUS/TDAP  10/18/2020  . MAMMOGRAM  10/19/2020  . FOOT EXAM  12/13/2020  . INFLUENZA VACCINE  Completed  . DEXA SCAN  Completed  . PNA vac Low Risk Adult  Completed  . HPV VACCINES  Aged Out    Health Maintenance  Health Maintenance Due  Topic Date Due  . OPHTHALMOLOGY EXAM  08/04/2019  . URINE MICROALBUMIN  04/16/2020    Colorectal cancer screening: No longer required.   Mammogram status: Completed 10/20/19. Repeat every year  Bone Density status: Completed 05/28/19. Results reflect: Bone density results: OSTEOPOROSIS. Repeat every 2 years.  Lung Cancer Screening: (Low Dose CT Chest recommended if Age 36-80 years, 30 pack-year currently smoking OR have quit w/in 15years.) does not qualify.    Additional Screening:  Hepatitis C Screening: does not qualify.   Vision Screening: Recommended annual ophthalmology  exams for early detection of glaucoma and other disorders of the eye. Is the patient up to date with their annual eye exam?  Yes  Who is the provider or what is the name of the office in which the patient attends annual eye exams? Dr. Katy Fitch  Dental Screening: Recommended annual dental exams for proper oral hygiene  Community Resource Referral / Chronic Care Management: CRR required this visit?  No   CCM required this visit?  No      Plan:     I have personally reviewed and noted the following in the patient's chart:   . Medical and social history . Use of alcohol, tobacco or illicit drugs  . Current medications and supplements . Functional ability and status . Nutritional status . Physical activity . Advanced directives . List of other physicians . Hospitalizations, surgeries, and ER visits in previous 12 months . Vitals . Screenings to include cognitive, depression, and falls . Referrals and appointments  In addition, I have reviewed and discussed with patient certain preventive protocols, quality metrics, and best practice recommendations. A written personalized care plan for preventive services as well as general preventive health recommendations were provided to patient.     [name], LPN   08/21/4033   Nurse Notes: pt states she talked to Dr. Ancil Boozer about in home care. Telephone note from 04/29/20 routed to Dr. Ancil Boozer today. Pt states she needs physical therapy and an aide to help with bathing. She is unable to get into the bathtub on her own.

## 2020-05-05 NOTE — Telephone Encounter (Signed)
Let pt know a referral was put in and authorized.

## 2020-05-05 NOTE — Patient Instructions (Signed)
Taylor [name] , Thank you for taking time to come for your Medicare Wellness Visit. I appreciate your ongoing commitment to your health goals. Please review the following plan we discussed and let me know if I can assist you in the future.   Screening recommendations/referrals: Colonoscopy: no longer required Mammogram: done 10/20/19 Bone Density: done 05/28/19 Recommended yearly ophthalmology/optometry visit for glaucoma screening and checkup Recommended yearly dental visit for hygiene and checkup  Vaccinations: Influenza vaccine: done 12/28/19 Pneumococcal vaccine: done 08/05/15 Tdap vaccine: done 10/19/10 Shingles vaccine: Shingrix discussed. Please contact your pharmacy for coverage information.  Covid-19: done 11/12/19 & 12/10/19  Advanced directives: Advance directive discussed with you today. I have provided a copy for you to complete at home and have notarized. Once this is complete please bring a copy in to our office so we can scan it into your chart.  Conditions/risks identified: Recommend drinking 6-8 glasses of water per day   Next appointment: Follow up in one year for your annual wellness visit    Preventive Care 65 Years and Older, Female Preventive care refers to lifestyle choices and visits with your health care provider that can promote health and wellness. What does preventive care include?  A yearly physical exam. This is also called an annual well check.  Dental exams once or twice a year.  Routine eye exams. Ask your health care provider how often you should have your eyes checked.  Personal lifestyle choices, including:  Daily care of your teeth and gums.  Regular physical activity.  Eating a healthy diet.  Avoiding tobacco and drug use.  Limiting alcohol use.  Practicing safe sex.  Taking low-dose aspirin every day.  Taking vitamin and mineral supplements as recommended by your health care provider. What happens during an annual well check? The  services and screenings done by your health care provider during your annual well check will depend on your age, overall health, lifestyle risk factors, and family history of disease. Counseling  Your health care provider may ask you questions about your:  Alcohol use.  Tobacco use.  Drug use.  Emotional well-being.  Home and relationship well-being.  Sexual activity.  Eating habits.  History of falls.  Memory and ability to understand (cognition).  Work and work Statistician.  Reproductive health. Screening  You may have the following tests or measurements:  Height, weight, and BMI.  Blood pressure.  Lipid and cholesterol levels. These may be checked every 5 years, or more frequently if you are over 76 years old.  Skin check.  Lung cancer screening. You may have this screening every year starting at age 59 if you have a 30-pack-year history of smoking and currently smoke or have quit within the past 15 years.  Fecal occult blood test (FOBT) of the stool. You may have this test every year starting at age 51.  Flexible sigmoidoscopy or colonoscopy. You may have a sigmoidoscopy every 5 years or a colonoscopy every 10 years starting at age 35.  Hepatitis C blood test.  Hepatitis B blood test.  Sexually transmitted disease (STD) testing.  Diabetes screening. This is done by checking your blood sugar (glucose) after you have not eaten for a while (fasting). You may have this done every 1-3 years.  Bone density scan. This is done to screen for osteoporosis. You may have this done starting at age 27.  Mammogram. This may be done every 1-2 years. Talk to your health care provider about how often you should have regular  mammograms. Talk with your health care provider about your test results, treatment options, and if necessary, the need for more tests. Vaccines  Your health care provider may recommend certain vaccines, such as:  Influenza vaccine. This is recommended  every year.  Tetanus, diphtheria, and acellular pertussis (Tdap, Td) vaccine. You may need a Td booster every 10 years.  Zoster vaccine. You may need this after age 23.  Pneumococcal 13-valent conjugate (PCV13) vaccine. One dose is recommended after age 24.  Pneumococcal polysaccharide (PPSV23) vaccine. One dose is recommended after age 23. Talk to your health care provider about which screenings and vaccines you need and how often you need them. This information is not intended to replace advice given to you by your health care provider. Make sure you discuss any questions you have with your health care provider. Document Released: 03/04/2015 Document Revised: 10/26/2015 Document Reviewed: 12/07/2014 Elsevier Interactive Patient Education  2017 [employer] Prevention in the Home Falls can cause injuries. They can happen to people of all ages. There are many things you can do to make your home safe and to help prevent falls. What can I do on the outside of my home?  Regularly fix the edges of walkways and driveways and fix any cracks.  Remove anything that might make you trip as you walk through a door, such as a raised step or threshold.  Trim any bushes or trees on the path to your home.  Use bright outdoor lighting.  Clear any walking paths of anything that might make someone trip, such as rocks or tools.  Regularly check to see if handrails are loose or broken. Make sure that both sides of any steps have handrails.  Any raised decks and porches should have guardrails on the edges.  Have any leaves, snow, or ice cleared regularly.  Use sand or salt on walking paths during winter.  Clean up any spills in your garage right away. This includes oil or grease spills. What can I do in the bathroom?  Use night lights.  Install grab bars by the toilet and in the tub and shower. Do not use towel bars as grab bars.  Use non-skid mats or decals in the tub or shower.  If  you need to sit down in the shower, use a plastic, non-slip stool.  Keep the floor dry. Clean up any water that spills on the floor as soon as it happens.  Remove soap buildup in the tub or shower regularly.  Attach bath mats securely with double-sided non-slip rug tape.  Do not have throw rugs and other things on the floor that can make you trip. What can I do in the bedroom?  Use night lights.  Make sure that you have a light by your bed that is easy to reach.  Do not use any sheets or blankets that are too big for your bed. They should not hang down onto the floor.  Have a firm chair that has side arms. You can use this for support while you get dressed.  Do not have throw rugs and other things on the floor that can make you trip. What can I do in the kitchen?  Clean up any spills right away.  Avoid walking on wet floors.  Keep items that you use a lot in easy-to-reach places.  If you need to reach something above you, use a strong step stool that has a grab bar.  Keep electrical cords out of the way.  Do not use floor polish or wax that makes floors slippery. If you must use wax, use non-skid floor wax.  Do not have throw rugs and other things on the floor that can make you trip. What can I do with my stairs?  Do not leave any items on the stairs.  Make sure that there are handrails on both sides of the stairs and use them. Fix handrails that are broken or loose. Make sure that handrails are as long as the stairways.  Check any carpeting to make sure that it is firmly attached to the stairs. Fix any carpet that is loose or worn.  Avoid having throw rugs at the top or bottom of the stairs. If you do have throw rugs, attach them to the floor with carpet tape.  Make sure that you have a light switch at the top of the stairs and the bottom of the stairs. If you do not have them, ask someone to add them for you. What else can I do to help prevent falls?  Wear shoes  that:  Do not have high heels.  Have rubber bottoms.  Are comfortable and fit you well.  Are closed at the toe. Do not wear sandals.  If you use a stepladder:  Make sure that it is fully opened. Do not climb a closed stepladder.  Make sure that both sides of the stepladder are locked into place.  Ask someone to hold it for you, if possible.  Clearly mark and make sure that you can see:  Any grab bars or handrails.  First and last steps.  Where the edge of each step is.  Use tools that help you move around (mobility aids) if they are needed. These include:  Canes.  Walkers.  Scooters.  Crutches.  Turn on the lights when you go into a dark area. Replace any light bulbs as soon as they burn out.  Set up your furniture so you have a clear path. Avoid moving your furniture around.  If any of your floors are uneven, fix them.  If there are any pets around you, be aware of where they are.  Review your medicines with your doctor. Some medicines can make you feel dizzy. This can increase your chance of falling. Ask your doctor what other things that you can do to help prevent falls. This information is not intended to replace advice given to you by your health care provider. Make sure you discuss any questions you have with your health care provider. Document Released: 12/02/2008 Document Revised: 07/14/2015 Document Reviewed: 03/12/2014 Elsevier Interactive Patient Education  2017 [employer].

## 2020-05-10 DIAGNOSIS — N2581 Secondary hyperparathyroidism of renal origin: Secondary | ICD-10-CM | POA: Diagnosis not present

## 2020-05-10 DIAGNOSIS — I10 Essential (primary) hypertension: Secondary | ICD-10-CM | POA: Diagnosis not present

## 2020-05-10 DIAGNOSIS — E1122 Type 2 diabetes mellitus with diabetic chronic kidney disease: Secondary | ICD-10-CM | POA: Diagnosis not present

## 2020-05-10 DIAGNOSIS — N1832 Chronic kidney disease, stage 3b: Secondary | ICD-10-CM | POA: Diagnosis not present

## 2020-05-12 ENCOUNTER — Telehealth: Payer: Self-pay

## 2020-05-12 DIAGNOSIS — Z79891 Long term (current) use of opiate analgesic: Secondary | ICD-10-CM | POA: Diagnosis not present

## 2020-05-12 DIAGNOSIS — G894 Chronic pain syndrome: Secondary | ICD-10-CM | POA: Diagnosis not present

## 2020-05-12 NOTE — Telephone Encounter (Signed)
Requesting return call to discuss the living will that was sent to her by dr Ancil Boozer.

## 2020-05-13 NOTE — Telephone Encounter (Signed)
resolved 

## 2020-05-13 NOTE — Telephone Encounter (Signed)
Spoke with patient, she will bring in her copy to [place].

## 2020-05-18 LAB — TOXASSURE SELECT 13 (MW), URINE

## 2020-06-03 ENCOUNTER — Other Ambulatory Visit: Payer: Self-pay | Admitting: Family Medicine

## 2020-06-03 DIAGNOSIS — I10 Essential (primary) hypertension: Secondary | ICD-10-CM

## 2020-06-06 DIAGNOSIS — M79674 Pain in right toe(s): Secondary | ICD-10-CM | POA: Diagnosis not present

## 2020-06-06 DIAGNOSIS — B351 Tinea unguium: Secondary | ICD-10-CM | POA: Diagnosis not present

## 2020-06-06 DIAGNOSIS — M79675 Pain in left toe(s): Secondary | ICD-10-CM | POA: Diagnosis not present

## 2020-06-10 ENCOUNTER — Telehealth: Payer: Self-pay

## 2020-06-10 NOTE — Progress Notes (Signed)
Chronic Care Management Pharmacy Assistant   Name: Taylor [name]  MRN: [id number] DOB: May 15, 1932  Reason for Encounter:Diabetes and Hypertension Disease State Call.   Recent office visits:  05/05/2020 Taylor Marker LPN (PCP Office)  09/32/6712 Dr.Sowles MD (PCP)   Recent consult visits:  06/06/2020 Taylor [name] (Podiatry)  05/10/2020 Taylor [name] (Nephrology)  04/26/2020 Dr.Adams MD (Anesthesiology) Started Oxycodone-Acetaminophen 7.5-325 mg PRN  Hospital visits:  None in previous 6 months  Medications: Outpatient Encounter Medications as of 06/10/2020  Medication Sig  . acetaminophen (TYLENOL) 500 MG tablet Take 1 tablet (500 mg total) by mouth 3 (three) times daily. (Patient taking differently: Take 500 mg by mouth every 6 (six) hours as needed for moderate pain or headache.)  . alendronate (FOSAMAX) 70 MG tablet Take by mouth.  [name] amLODipine (NORVASC) 10 MG tablet TAKE 1 TABLET BY MOUTH EVERY DAY  . brimonidine-timolol (COMBIGAN) 0.2-0.5 % ophthalmic solution Place 1 drop into both eyes 2 (two) times daily.   . Calcium Carbonate-Vitamin D (OYSTER SHELL CALCIUM 500 + D PO) Take 1 tablet by mouth 2 (two) times a day.  . denosumab (PROLIA) 60 MG/ML SOSY injection Inject into the skin.  [name] dorzolamide (TRUSOPT) 2 % ophthalmic solution 1 drop 2 (two) times daily.  . famotidine (PEPCID) 20 MG tablet TAKE 1 TABLET BY MOUTH EVERY DAY  . ferrous sulfate 325 (65 FE) MG tablet TAKE 1 TABLET BY MOUTH EVERY DAY WITH BREAKFAST  . fluticasone (FLONASE) 50 MCG/ACT nasal spray SPRAY 2 SPRAYS INTO EACH NOSTRIL EVERY DAY  . Lancets (ONETOUCH DELICA PLUS [id number]) MISC 1 each by Does not apply route daily.  [name] lubiprostone (AMITIZA) 24 MCG capsule TAKE 1 CAPSULE (24 MCG TOTAL) BY MOUTH 2 (TWO) TIMES DAILY WITH A MEAL.  . metFORMIN (GLUCOPHAGE-XR) 750 MG 24 hr tablet TAKE 2 TABLETS (1,500 MG TOTAL) BY MOUTH DAILY WITH BREAKFAST.  [name] omega-3 acid ethyl esters (LOVAZA) 1 g capsule TAKE 1 CAPSULE BY  MOUTH 2 TIMES DAILY.  [name] ONETOUCH ULTRA test strip USE AS DIRECTED  . oxyCODONE-acetaminophen (PERCOCET) 7.5-325 MG tablet Take 1 tablet by mouth every 8 (eight) hours as needed for moderate pain or severe pain.  . pravastatin (PRAVACHOL) 40 MG tablet TAKE 1 TABLET BY MOUTH EVERYDAY AT BEDTIME  . ROCKLATAN 0.02-0.005 % SOLN Apply 1 drop to eye at bedtime.   No facility-administered encounter medications on file as of 06/10/2020.   Star Rating Drugs: Metformin 750 mg last filled on 05/03/2020 for 90 day supply at [facility]. Pravastatin 40 mg last filled on 03/23/2020 for 90 day supply at [facility]. Olmesartan 20 mg last filled on 04/02/2020 for 90 day supply at [facility]. Reviewed chart prior to disease state call. Spoke with patient regarding BP  Recent Office Vitals: BP Readings from Last 3 Encounters:  04/18/20 130/62  12/14/19 (!) 130/58  10/30/19 (!) 185/89   Pulse Readings from Last 3 Encounters:  04/18/20 79  12/14/19 61  10/30/19 84    Wt Readings from Last 3 Encounters:  04/18/20 185 lb (83.9 kg)  12/14/19 183 lb 14.4 oz (83.4 kg)  10/30/19 188 lb (85.3 kg)     Kidney Function Lab Results  Component Value Date/Time   CREATININE 1.35 (H) 12/28/2019 02:00 PM   CREATININE 1.21 (H) 04/17/2019 03:12 PM   GFRNONAA 36 (L) 12/28/2019 02:00 PM   GFRAA 42 (L) 12/28/2019 02:00 PM    BMP Latest Ref Rng & Units 12/28/2019 04/17/2019 12/15/2018  Glucose 65 - 99 mg/dL  200(H) 114(H) 126(H)  BUN 7 - 25 mg/dL 21 22 21   Creatinine 0.60 - 0.88 mg/dL 1.35(H) 1.21(H) 1.58(H)  BUN/Creat Ratio 6 - 22 (calc) 16 18 13   Sodium 135 - 146 mmol/L 141 140 139  Potassium 3.5 - 5.3 mmol/L 5.1 4.5 4.4  Chloride 98 - 110 mmol/L 109 109 109  CO2 20 - 32 mmol/L 22 21 19(L)  Calcium 8.6 - 10.4 mg/dL 9.7 10.0 9.4    . Current antihypertensive regimen:   Amlodipine 10 mg daily   Olmesartan 20 mg daily  . How often are you checking your Blood Pressure? weekly . Current home BP readings:   o Patient states she can not remember her reading but she knows it was "150 or something". o Patient states her blood pressure cuff does not fit her arm "tight" because she lost weight.Patient states she will use her wrist cuff next time she checks her blood pressure. . What recent interventions/DTPs have been made by any provider to improve Blood Pressure control since last CPP Visit: None ID . Any recent hospitalizations or ED visits since last visit with CPP? No . What diet changes have been made to improve Blood Pressure Control?  o No change indicated . What exercise is being done to improve your Blood Pressure Control?  o Patient states she does not exercise.  Adherence Review: Is the patient currently on ACE/ARB medication? Yes Does the patient have >5 day gap between last estimated fill dates? No   Recent Relevant Labs: Lab Results  Component Value Date/Time   HGBA1C 6.8 (A) 04/18/2020 01:58 PM   HGBA1C 6.9 (A) 12/14/2019 02:37 PM   HGBA1C 6.5 04/17/2019 02:08 PM   HGBA1C 6.6 08/13/2018 02:51 PM   HGBA1C 7.8 (A) 08/05/2017 11:30 AM   MICROALBUR 1.8 04/17/2019 03:12 PM   MICROALBUR 10.4 04/09/2018 02:43 PM   MICROALBUR 50 08/05/2017 11:28 AM   MICROALBUR 100 12/12/2015 12:21 PM    Kidney Function Lab Results  Component Value Date/Time   CREATININE 1.35 (H) 12/28/2019 02:00 PM   CREATININE 1.21 (H) 04/17/2019 03:12 PM   GFRNONAA 36 (L) 12/28/2019 02:00 PM   GFRAA 42 (L) 12/28/2019 02:00 PM    . Current antihyperglycemic regimen:   Metformin XR 750 mg daily  . What recent interventions/DTPs have been made to improve glycemic control:  o None ID . Have there been any recent hospitalizations or ED visits since last visit with CPP? No . Patient denies hypoglycemic symptoms, including Pale, Sweaty, Shaky, Hungry, Nervous/irritable and Vision changes . Patient denies hyperglycemic symptoms, including blurry vision, excessive thirst, fatigue and weakness . How often are  you checking your blood sugar? Infrequently  . What are your blood sugars ranging?  o Patient states her blood sugar was 200 after eating candy (jelly beans). . During the week, how often does your blood glucose drop below 70? Never . Are you checking your feet daily/regularly?   Patient denies tingling or numbness but reports pain that shoots up her leg and makes her feel off balance.  Adherence Review: Is the patient currently on a STATIN medication? Yes Is the patient currently on ACE/ARB medication? Yes Does the patient have >5 day gap between last estimated fill dates? No   Anderson [place] Clinical Production designer, theatre/television/film [phone]

## 2020-07-05 DIAGNOSIS — M810 Age-related osteoporosis without current pathological fracture: Secondary | ICD-10-CM | POA: Diagnosis not present

## 2020-07-11 ENCOUNTER — Telehealth: Payer: Self-pay | Admitting: Anesthesiology

## 2020-07-11 NOTE — Telephone Encounter (Signed)
The patient had an appointment on 04-26-2020.  2 scripts for oxycodone were sent in to last until 07-07-2020 .  The patient needs an appointment with Dr Andree Elk as soon as possible.

## 2020-07-14 ENCOUNTER — Ambulatory Visit: Payer: Medicare Other | Attending: Anesthesiology | Admitting: Anesthesiology

## 2020-07-14 ENCOUNTER — Other Ambulatory Visit: Payer: Self-pay

## 2020-07-14 ENCOUNTER — Encounter: Payer: Self-pay | Admitting: Anesthesiology

## 2020-07-14 DIAGNOSIS — G894 Chronic pain syndrome: Secondary | ICD-10-CM | POA: Diagnosis not present

## 2020-07-14 DIAGNOSIS — M159 Polyosteoarthritis, unspecified: Secondary | ICD-10-CM

## 2020-07-14 DIAGNOSIS — G8929 Other chronic pain: Secondary | ICD-10-CM | POA: Diagnosis not present

## 2020-07-14 DIAGNOSIS — M79642 Pain in left hand: Secondary | ICD-10-CM | POA: Diagnosis not present

## 2020-07-14 DIAGNOSIS — M79641 Pain in right hand: Secondary | ICD-10-CM | POA: Diagnosis not present

## 2020-07-14 DIAGNOSIS — M25561 Pain in right knee: Secondary | ICD-10-CM | POA: Diagnosis not present

## 2020-07-14 DIAGNOSIS — Z79891 Long term (current) use of opiate analgesic: Secondary | ICD-10-CM | POA: Diagnosis not present

## 2020-07-14 DIAGNOSIS — M25562 Pain in left knee: Secondary | ICD-10-CM

## 2020-07-14 DIAGNOSIS — M545 Low back pain, unspecified: Secondary | ICD-10-CM | POA: Diagnosis not present

## 2020-07-14 MED ORDER — OXYCODONE-ACETAMINOPHEN 7.5-325 MG PO TABS: 1.0000 | ORAL_TABLET | Freq: Three times a day (TID) | ORAL | 0 refills | Status: AC | PRN

## 2020-07-14 MED ORDER — OXYCODONE-ACETAMINOPHEN 7.5-325 MG PO TABS: 1.0000 | ORAL_TABLET | Freq: Three times a day (TID) | ORAL | 0 refills | Status: DC | PRN

## 2020-07-14 NOTE — Progress Notes (Signed)
Virtual Visit via Telephone Note  I connected with Taylor [name] on 07/14/20 at 10:45 AM EDT by telephone and verified that I am speaking with the correct person using two identifiers.  Location: Patient: Home Provider: Pain control center   I discussed the limitations, risks, security and privacy concerns of performing an evaluation and management service by telephone and the availability of in person appointments. I also discussed with the patient that there may be a patient responsible charge related to this service. The patient expressed understanding and agreed to proceed.   History of Present Illness: I spoke to Taylor [name] today via telephone as we were unable to link for the video portion of the virtual conference but she reports that she has been out of medicine and her back pain has been acting up.  She still has a considerable amount of hand and hip pain.  These are comparable to what she is experienced in the past but generally these are better by about 75% when she takes her Percocet.  She takes this 3 times a day and this has been working well for her.  No side effects are reported and she gets good relief generally lasting about 4 to 6 hours before she has recurrence of the same quality pain.  No changes in the quality of the pain has been noted otherwise.  She is doing her stretching strengthening exercises and staying active and this combination enables her to sleep well and function well during the day.  She denies any problems with bowel or bladder function or change in sensorimotor function otherwise her review of systems have been stable and no changes are reported.   Observations/Objective:  Current Outpatient Medications:  .  oxyCODONE-acetaminophen (PERCOCET) 7.5-325 MG tablet, Take 1 tablet by mouth every 8 (eight) hours as needed for moderate pain or severe pain., Disp: 90 tablet, Rfl: 0 .  [START ON 08/13/2020] oxyCODONE-acetaminophen (PERCOCET) 7.5-325 MG tablet, Take 1 tablet  by mouth every 8 (eight) hours as needed for moderate pain or severe pain., Disp: 90 tablet, Rfl: 0 .  acetaminophen (TYLENOL) 500 MG tablet, Take 1 tablet (500 mg total) by mouth 3 (three) times daily. (Patient taking differently: Take 500 mg by mouth every 6 (six) hours as needed for moderate pain or headache.), Disp: 90 tablet, Rfl: 0 .  alendronate (FOSAMAX) 70 MG tablet, Take by mouth., Disp: , Rfl:  .  amLODipine (NORVASC) 10 MG tablet, TAKE 1 TABLET BY MOUTH EVERY DAY, Disp: 90 tablet, Rfl: 1 .  brimonidine-timolol (COMBIGAN) 0.2-0.5 % ophthalmic solution, Place 1 drop into both eyes 2 (two) times daily. , Disp: , Rfl:  .  Calcium Carbonate-Vitamin D (OYSTER SHELL CALCIUM 500 + D PO), Take 1 tablet by mouth 2 (two) times a day., Disp: , Rfl:  .  denosumab (PROLIA) 60 MG/ML SOSY injection, Inject into the skin., Disp: , Rfl:  .  dorzolamide (TRUSOPT) 2 % ophthalmic solution, 1 drop 2 (two) times daily., Disp: , Rfl:  .  famotidine (PEPCID) 20 MG tablet, TAKE 1 TABLET BY MOUTH EVERY DAY, Disp: 90 tablet, Rfl: 1 .  ferrous sulfate 325 (65 FE) MG tablet, TAKE 1 TABLET BY MOUTH EVERY DAY WITH BREAKFAST, Disp: 90 tablet, Rfl: 1 .  fluticasone (FLONASE) 50 MCG/ACT nasal spray, SPRAY 2 SPRAYS INTO EACH NOSTRIL EVERY DAY, Disp: 48 mL, Rfl: 1 .  Lancets (ONETOUCH DELICA PLUS [id number]) MISC, 1 each by Does not apply route daily., Disp: 100 each, Rfl: 2 .  lubiprostone (AMITIZA) 24 MCG capsule, TAKE 1 CAPSULE (24 MCG TOTAL) BY MOUTH 2 (TWO) TIMES DAILY WITH A MEAL., Disp: 90 capsule, Rfl: 1 .  metFORMIN (GLUCOPHAGE-XR) 750 MG 24 hr tablet, TAKE 2 TABLETS (1,500 MG TOTAL) BY MOUTH DAILY WITH BREAKFAST., Disp: 180 tablet, Rfl: 1 .  omega-3 acid ethyl esters (LOVAZA) 1 g capsule, TAKE 1 CAPSULE BY MOUTH 2 TIMES DAILY., Disp: 180 capsule, Rfl: 1 .  ONETOUCH ULTRA test strip, USE AS DIRECTED, Disp: 100 strip, Rfl: 12 .  pravastatin (PRAVACHOL) 40 MG tablet, TAKE 1 TABLET BY MOUTH EVERYDAY AT BEDTIME, Disp: 90  tablet, Rfl: 1 .  ROCKLATAN 0.02-0.005 % SOLN, Apply 1 drop to eye at bedtime., Disp: , Rfl:   Assessment and Plan:  1. Chronic pain syndrome   2. Chronic pain of right knee   3. Pain in both hands   4. Chronic midline low back pain without sciatica   5. Long term prescription opiate use   6. Chronic pain of both knees   7. Osteoarthritis of multiple joints, unspecified osteoarthritis type   Based on her discussion today and upon review of the [place] practitioner database information going to refill her medicines for today and for 1 month from today with a schedule return in approximately 2 months.  I have reviewed her urine screen which was negative on last eval back in March.  I want her to continue her stretching strengthening exercises and activity as reviewed today and follow-up with her primary care physicians for baseline medical care. Follow Up Instructions:    I discussed the assessment and treatment plan with the patient. The patient was provided an opportunity to ask questions and all were answered. The patient agreed with the plan and demonstrated an understanding of the instructions.   The patient was advised to call back or seek an in-person evaluation if the symptoms worsen or if the condition fails to improve as anticipated.  I provided 30 minutes of non-face-to-face time during this encounter.   [name], MD

## 2020-07-15 ENCOUNTER — Other Ambulatory Visit: Payer: Self-pay | Admitting: Family Medicine

## 2020-07-15 DIAGNOSIS — E7800 Pure hypercholesterolemia, unspecified: Secondary | ICD-10-CM

## 2020-07-15 DIAGNOSIS — I10 Essential (primary) hypertension: Secondary | ICD-10-CM

## 2020-07-15 DIAGNOSIS — K5909 Other constipation: Secondary | ICD-10-CM

## 2020-07-19 DIAGNOSIS — H35372 Puckering of macula, left eye: Secondary | ICD-10-CM | POA: Diagnosis not present

## 2020-07-19 DIAGNOSIS — H401133 Primary open-angle glaucoma, bilateral, severe stage: Secondary | ICD-10-CM | POA: Diagnosis not present

## 2020-07-19 DIAGNOSIS — Z961 Presence of intraocular lens: Secondary | ICD-10-CM | POA: Diagnosis not present

## 2020-07-22 ENCOUNTER — Other Ambulatory Visit: Payer: Self-pay | Admitting: Family Medicine

## 2020-07-22 DIAGNOSIS — K219 Gastro-esophageal reflux disease without esophagitis: Secondary | ICD-10-CM

## 2020-07-27 DIAGNOSIS — E559 Vitamin D deficiency, unspecified: Secondary | ICD-10-CM | POA: Diagnosis not present

## 2020-07-27 DIAGNOSIS — M810 Age-related osteoporosis without current pathological fracture: Secondary | ICD-10-CM | POA: Diagnosis not present

## 2020-07-27 DIAGNOSIS — E213 Hyperparathyroidism, unspecified: Secondary | ICD-10-CM | POA: Diagnosis not present

## 2020-07-31 ENCOUNTER — Other Ambulatory Visit: Payer: Self-pay | Admitting: Family Medicine

## 2020-07-31 DIAGNOSIS — I10 Essential (primary) hypertension: Secondary | ICD-10-CM

## 2020-07-31 NOTE — Telephone Encounter (Signed)
dc'd 04/18/20 "error" Taylor [name] CMA

## 2020-08-08 ENCOUNTER — Other Ambulatory Visit: Payer: Self-pay

## 2020-08-08 ENCOUNTER — Encounter (INDEPENDENT_AMBULATORY_CARE_PROVIDER_SITE_OTHER): Payer: Medicare Other | Admitting: Ophthalmology

## 2020-08-08 DIAGNOSIS — I10 Essential (primary) hypertension: Secondary | ICD-10-CM | POA: Diagnosis not present

## 2020-08-08 DIAGNOSIS — H43813 Vitreous degeneration, bilateral: Secondary | ICD-10-CM | POA: Diagnosis not present

## 2020-08-08 DIAGNOSIS — H35033 Hypertensive retinopathy, bilateral: Secondary | ICD-10-CM

## 2020-08-08 DIAGNOSIS — H35342 Macular cyst, hole, or pseudohole, left eye: Secondary | ICD-10-CM | POA: Diagnosis not present

## 2020-08-09 ENCOUNTER — Telehealth: Payer: Self-pay

## 2020-08-09 NOTE — Telephone Encounter (Signed)
Attempted to contact patient to see if she was still in need of Home Health assistance. Left voicemail x2. Awaiting return call.

## 2020-08-12 ENCOUNTER — Other Ambulatory Visit: Payer: Self-pay | Admitting: Family Medicine

## 2020-08-12 DIAGNOSIS — K219 Gastro-esophageal reflux disease without esophagitis: Secondary | ICD-10-CM

## 2020-08-12 DIAGNOSIS — R809 Proteinuria, unspecified: Secondary | ICD-10-CM

## 2020-08-12 DIAGNOSIS — E1129 Type 2 diabetes mellitus with other diabetic kidney complication: Secondary | ICD-10-CM

## 2020-08-16 NOTE — Progress Notes (Signed)
Name: Taylor [name]   MRN: [id number]    DOB: Feb 02, 1933   Date:08/17/2020       Progress Note  Subjective  Chief Complaint  Follow Up  HPI  Weight loss: her weight went from 205 lbs to 183 lbs back in Feb 2021, but has been stable since,today it was 182 lbs.  We checked multiple labs and within normal limits earlier this year . She did not change her diet. Since was seen by Dr. Hampton Abbot - breast lump was fibroadenoma , she had a virtual visit with Dr. Bonna Gains for evaluation of iron deficiency anemia and had EGD and colonoscopy, poor colon prep, EGD unremarkable. She saw Dr. Bonna Gains in July, she wants her to control constipation and go back for a repeat colonoscopy    DM: she states glucose is usually around 150160's occasionally goes up to 190 , she  taking metformin 750 mg daily, today A 1 C is stable at 6.8 %    She denies polyphagia, polydipsia or polyuria. . She has a history of microalbuminuria , she was on Benicar 20 mg but not on her current medication list. She is not sure why she is not taking it. She has CKI and is under the care of nephrologist., but no recent notes from Dr. Candiss Norse  She takes Lovaza and statin for dyslipidemia and denies side effects. She also has associated Obesity, HTN.    Morbid obesity: BMI  above 35 with DM, HTN, dyslipidemia, GERD, she has pain and difficulty standing up to prepare meals, we discussed meals on wheels but she is not interested at this time   Hearing loss: very difficulty to hear [place], had to repeat same question multiple times, she has hearing aids but forgot to put them on again. Reminded her to wear her hearing aids    HTN with CKI stage III: also has secondary hyperparathyroidism, last Pth was 116 07/2020 under the care of nephrologist, Dr. Candiss Norse. No pruritis noticed, BP today is at goal. She idid not bring her medications with her and not sure if taking Benicar .    Chronic pain: she has chronic right knee pain, under the care of pain clinic  and uses a walker to assist with ambulation . She also has daily back pain and feels weak on her back, she is under the care of Dr. Andree Elk , she states she has difficulty taking care of her house, preparing meals, unable to get in her bath tub due to pain on right knee  Pain right now is worse on her right knee   GERD: well controlled with Pepcid, no heartburn or indigestion, denies vomiting . Unchanged   Chronic constipation: she has Amitiza but only taking once a day and has bowel movements about twice a week but can go longer without a bowel movement even on medication , very seldom has abdominal pain, no blood in stools Advised to follow up with GI    Atherosclerosis aorta: on statin therapy not on aspirin because of  high risk of falls .Last LDL was at goal    Osteoporosis: seeing Dr. Honor Junes for Prolia and is tolerating it well, she states not on Alendronate but did not bring any of her medications to her visit today   Patient lives alone and is not interested in placement. Daughter lives in [place] and her son is a Administrator  and is usually not in town  She did not bring any of her medications again, on her  list Benicar has been removed, not sure by whom or when , patient unable to give me information. We contacted pharmacy to see what she is currently taking at home  Patient Active Problem List   Diagnosis Date Noted   Gastric polyp    Polyp of colon    Morbid obesity ([place]) 04/08/2018   Age-related osteoporosis without current pathological fracture 09/26/2017   Pain in both hands 09/23/2017   Hyperparathyroidism ([place]) 08/06/2017   Chronic kidney disease, stage III (moderate) ([place]) 08/06/2017   HLD (hyperlipidemia) 07/25/2017   Osteoporotic compression fracture of spine ([place]) 03/22/2017   Assistance needed with transportation 03/21/2017   Iron deficiency anemia 12/19/2016   Aortic atherosclerosis ([place]) 11/23/2016   Anterolisthesis 11/23/2016   Chronic pain syndrome  09/13/2016   Vitamin D deficiency 08/19/2016   Anemia, unspecified 08/19/2016   Primary open angle glaucoma (POAG) of both eyes, severe stage 08/17/2016   Hypertensive retinopathy 08/17/2016   Long term current use of opiate analgesic 07/19/2016   Dyslipidemia associated with type 2 diabetes mellitus ([place]) 04/16/2016   Chronic midline low back pain without sciatica 04/16/2016   Chronic pain of right knee 04/16/2016   Vaginal dryness 02/22/2015   Calculus of kidney 11/10/2014   Osteoarthritis, multiple sites 09/09/2014   Type 2 diabetes mellitus with microalbuminuria ([place]) 09/09/2014   Glaucoma 09/09/2014   Benign hypertension with CKD (chronic kidney disease) stage III ([place]) 09/09/2014   Chronic pain 09/09/2014   Tinea corporis 30/86/5784   Umbilical hernia 69/62/9528   Mass of right breast     Past Surgical History:  Procedure Laterality Date   ABDOMINAL HYSTERECTOMY  1958   menorrhagia   BACK SURGERY     BREAST BIOPSY Left 01/25/2012   BREAST BIOPSY Right 04/08/2019   hear clip, [place] bx, path pending   BREAST EXCISIONAL BIOPSY     BREAST LUMPECTOMY WITH NEEDLE LOCALIZATION Right 05/12/2019   Procedure: BREAST LUMPECTOMY WITH NEEDLE LOCALIZATION;  Surgeon: Olean Ree, MD;  Location: [facility];  Service: General;  Laterality: Right;   CATARACT EXTRACTION, BILATERAL Bilateral    1st 06/07/15 and the 2nd 06/21/15   CHOLECYSTECTOMY     COLONOSCOPY  2003   [facility]   COLONOSCOPY WITH PROPOFOL N/A 07/02/2019   Procedure: COLONOSCOPY WITH PROPOFOL;  Surgeon: Virgel Manifold, MD;  Location: [facility] END[facility];  Service: Endoscopy;  Laterality: N/A;   COLONOSCOPY WITH PROPOFOL N/A 07/16/2019   Procedure: COLONOSCOPY WITH PROPOFOL;  Surgeon: Virgel Manifold, MD;  Location: [facility] END[facility];  Service: Endoscopy;  Laterality: N/A;   ESOPHAGOGASTRODUODENOSCOPY (EGD) WITH PROPOFOL N/A 07/02/2019   Procedure: ESOPHAGOGASTRODUODENOSCOPY (EGD) WITH PROPOFOL;  Surgeon: Virgel Manifold, MD;   Location: [facility];  Service: Endoscopy;  Laterality: N/A;   EYE SURGERY     KNEE SURGERY  2009,2011,2012   twice on right and once on left   [place] SURGERY  2004   TONSILLECTOMY     age of 38    Family History  Problem Relation Age of Onset   Cancer Mother        skin cancer   Stroke Mother    Heart disease Father    Breast cancer Neg Hx     Social History   Tobacco Use   Smoking status: Never   Smokeless tobacco: Never  Substance Use Topics   Alcohol use: No    Alcohol/week: 0.0 standard drinks     Current Outpatient  Medications:    acetaminophen (TYLENOL) 500 MG tablet, Take 1 tablet (500 mg total) by mouth 3 (three) times daily. (Patient taking differently: Take 500 mg by mouth every 6 (six) hours as needed for moderate pain or headache.), Disp: 90 tablet, Rfl: 0   alendronate (FOSAMAX) 70 MG tablet, Take by mouth., Disp: , Rfl:    amLODipine (NORVASC) 10 MG tablet, TAKE 1 TABLET BY MOUTH EVERY DAY, Disp: 90 tablet, Rfl: 1   brimonidine-timolol (COMBIGAN) 0.2-0.5 % ophthalmic solution, Place 1 drop into both eyes 2 (two) times daily. , Disp: , Rfl:    Calcium Carbonate-Vitamin D (OYSTER SHELL CALCIUM 500 + D PO), Take 1 tablet by mouth 2 (two) times a day., Disp: , Rfl:    denosumab (PROLIA) 60 MG/ML SOSY injection, Inject into the skin., Disp: , Rfl:    dorzolamide (TRUSOPT) 2 % ophthalmic solution, 1 drop 2 (two) times daily., Disp: , Rfl:    famotidine (PEPCID) 20 MG tablet, TAKE 1 TABLET BY MOUTH EVERY DAY, Disp: 90 tablet, Rfl: 1   ferrous sulfate 325 (65 FE) MG tablet, TAKE 1 TABLET BY MOUTH EVERY DAY WITH BREAKFAST, Disp: 90 tablet, Rfl: 1   fluticasone (FLONASE) 50 MCG/ACT nasal spray, SPRAY 2 SPRAYS INTO EACH NOSTRIL EVERY DAY, Disp: 48 mL, Rfl: 1   Lancets (ONETOUCH DELICA PLUS [id number]) MISC, 1 each by Does not apply route daily., Disp: 100 each, Rfl: 2   lubiprostone (AMITIZA) 24 MCG capsule, TAKE 1 CAPSULE (24  MCG TOTAL) BY MOUTH 2 (TWO) TIMES DAILY WITH A MEAL., Disp: 90 capsule, Rfl: 1   metFORMIN (GLUCOPHAGE-XR) 750 MG 24 hr tablet, TAKE 2 TABLETS (1,500 MG TOTAL) BY MOUTH DAILY WITH BREAKFAST., Disp: 180 tablet, Rfl: 1   omega-3 acid ethyl esters (LOVAZA) 1 g capsule, TAKE 1 CAPSULE BY MOUTH 2 TIMES DAILY., Disp: 180 capsule, Rfl: 1   ONETOUCH ULTRA test strip, USE AS DIRECTED, Disp: 100 strip, Rfl: 12   oxyCODONE-acetaminophen (PERCOCET) 7.5-325 MG tablet, Take 1 tablet by mouth every 8 (eight) hours as needed for moderate pain or severe pain., Disp: 90 tablet, Rfl: 0   pravastatin (PRAVACHOL) 40 MG tablet, TAKE 1 TABLET BY MOUTH EVERYDAY AT BEDTIME, Disp: 90 tablet, Rfl: 1   ROCKLATAN 0.02-0.005 % SOLN, Apply 1 drop to eye at bedtime., Disp: , Rfl:   Allergies  Allergen Reactions   Ace Inhibitors Cough    I personally reviewed active problem list, medication list, allergies, family history, social history, health maintenance with the patient/caregiver today.   ROS   Constitutional: Negative for fever or weight change.  Respiratory: Negative for cough and shortness of breath.   Cardiovascular: Negative for chest pain or palpitations.  Gastrointestinal: Negative for abdominal pain, no bowel changes.  Musculoskeletal: positive  for gait problem, he has intermittent  joint swelling.  Skin: Negative for rash.  Neurological: Negative for dizziness or headache.  No other specific complaints in a complete review of systems (except as listed in HPI above).   Objective  Vitals:   08/17/20 1320  BP: 130/74  Pulse: 73  Resp: 16  Temp: 98.3 F (36.8 C)  TempSrc: Oral  SpO2: 98%  Weight: 182 lb (82.6 kg)  Height: 5\' 1"  (1.549 m)    Body mass index is 34.39 kg/m.  Physical Exam  Constitutional: Patient appears well-developed and well-nourished. Obese  No distress.  HEENT: head atraumatic, normocephalic, pupils equal and reactive to light,neck supple Cardiovascular: Normal rate,  regular rhythm and normal heart  sounds.  No murmur heard. Trace BLE edema. Pulmonary/Chest: Effort normal and breath sounds normal. No respiratory distress. Abdominal: Soft.  There is no tenderness. Psychiatric: Patient has a normal mood and affect. behavior is normal. Judgment and thought content normal.  Muscular Skeletal: pain with extension of right knee, uses walker to assist with ambulation   Recent Results (from the past 2160 hour(s))  POCT HgB A1C     Status: Abnormal   Collection Time: 08/17/20  1:22 PM  Result Value Ref Range   Hemoglobin A1C 6.8 (A) 4.0 - 5.6 %   HbA1c POC (<> result, manual entry)     HbA1c, POC (prediabetic range)     HbA1c, POC (controlled diabetic range)      PHQ2/9: Depression screen [facility] 2/9 08/17/2020 05/05/2020 04/18/2020 12/14/2019 08/27/2019  Decreased Interest 0 0 0 0 0  Down, Depressed, Hopeless 0 0 0 0 0  PHQ - 2 Score 0 0 0 0 0  Altered sleeping - - - - -  Tired, decreased energy - - - - -  Change in appetite - - - - -  Feeling bad or failure about yourself  - - - - -  Trouble concentrating - - - - -  Moving slowly or fidgety/restless - - - - -  Suicidal thoughts - - - - -  PHQ-9 Score - - - - -  Difficult doing work/chores - - - - -  Some recent data might be hidden    phq 9 is negative   Fall Risk: Fall Risk  08/17/2020 05/05/2020 04/18/2020 12/14/2019 10/30/2019  Falls in the past year? 0 0 0 0 0  Comment - - - - -  Number falls in past yr: 0 0 0 0 -  Injury with Fall? 0 0 0 0 -  Risk Factor Category  - - - - -  Risk for fall due to : - Impaired balance/gait;Impaired mobility;Impaired vision - - -  Follow up - Falls prevention discussed - - -      Functional Status Survey: Is the patient deaf or have difficulty hearing?: Yes Does the patient have difficulty seeing, even when wearing glasses/contacts?: Yes Does the patient have difficulty concentrating, remembering, or making decisions?: No Does the patient have difficulty walking  or climbing stairs?: Yes Does the patient have difficulty dressing or bathing?: Yes Does the patient have difficulty doing errands alone such as visiting a doctor's office or shopping?: Yes    Assessment & Plan  1. Type 2 diabetes mellitus with microalbuminuria, without long-term current use of insulin (HCC)  - POCT HgB A1C  2. Chronic constipation  Explained needs to go back to GI - lubiprostone (AMITIZA) 24 MCG capsule; Take 1 capsule (24 mcg total) by mouth 2 (two) times daily with a meal.  Dispense: 180 capsule; Refill: 1 - Ambulatory referral to Gastroenterology  3. Aortic atherosclerosis ([place])  On statin therapy   4. Age-related osteoporosis without current pathological fracture  Seeing Dr. Honor Junes and getting Prolia   5. Essential hypertension  At goal   6. Dyslipidemia associated with type 2 diabetes mellitus ([place])   7. Chronic bilateral low back pain without sciatica   8. Obesity    Weight is stable now   9. Hyperparathyroidism ([place])   10. Stage 3a chronic kidney disease [facility])  We contacted Dr. Candiss Norse and she has a follow up in September   11. Benign hypertension with CKD (chronic kidney disease) stage III ([place])   12.  GERD without esophagitis   13. Other iron deficiency anemia  - Ambulatory referral to Gastroenterology

## 2020-08-17 ENCOUNTER — Ambulatory Visit (INDEPENDENT_AMBULATORY_CARE_PROVIDER_SITE_OTHER): Payer: Medicare Other | Admitting: Family Medicine

## 2020-08-17 ENCOUNTER — Encounter: Payer: Self-pay | Admitting: Family Medicine

## 2020-08-17 ENCOUNTER — Other Ambulatory Visit: Payer: Self-pay

## 2020-08-17 VITALS — BP 130/74 | HR 73 | Temp 98.3°F | Resp 16 | Ht 61.0 in | Wt 182.0 lb

## 2020-08-17 DIAGNOSIS — I700 Atherosclerosis of aorta: Secondary | ICD-10-CM | POA: Diagnosis not present

## 2020-08-17 DIAGNOSIS — R809 Proteinuria, unspecified: Secondary | ICD-10-CM

## 2020-08-17 DIAGNOSIS — M545 Low back pain, unspecified: Secondary | ICD-10-CM

## 2020-08-17 DIAGNOSIS — N1831 Chronic kidney disease, stage 3a: Secondary | ICD-10-CM | POA: Diagnosis not present

## 2020-08-17 DIAGNOSIS — K219 Gastro-esophageal reflux disease without esophagitis: Secondary | ICD-10-CM

## 2020-08-17 DIAGNOSIS — E669 Obesity, unspecified: Secondary | ICD-10-CM

## 2020-08-17 DIAGNOSIS — E785 Hyperlipidemia, unspecified: Secondary | ICD-10-CM

## 2020-08-17 DIAGNOSIS — G8929 Other chronic pain: Secondary | ICD-10-CM

## 2020-08-17 DIAGNOSIS — E1169 Type 2 diabetes mellitus with other specified complication: Secondary | ICD-10-CM

## 2020-08-17 DIAGNOSIS — E213 Hyperparathyroidism, unspecified: Secondary | ICD-10-CM | POA: Diagnosis not present

## 2020-08-17 DIAGNOSIS — I10 Essential (primary) hypertension: Secondary | ICD-10-CM

## 2020-08-17 DIAGNOSIS — I129 Hypertensive chronic kidney disease with stage 1 through stage 4 chronic kidney disease, or unspecified chronic kidney disease: Secondary | ICD-10-CM

## 2020-08-17 DIAGNOSIS — M810 Age-related osteoporosis without current pathological fracture: Secondary | ICD-10-CM | POA: Diagnosis not present

## 2020-08-17 DIAGNOSIS — E1129 Type 2 diabetes mellitus with other diabetic kidney complication: Secondary | ICD-10-CM | POA: Diagnosis not present

## 2020-08-17 DIAGNOSIS — E66811 Obesity, class 1: Secondary | ICD-10-CM

## 2020-08-17 DIAGNOSIS — K5909 Other constipation: Secondary | ICD-10-CM

## 2020-08-17 DIAGNOSIS — D508 Other iron deficiency anemias: Secondary | ICD-10-CM

## 2020-08-17 DIAGNOSIS — N183 Chronic kidney disease, stage 3 unspecified: Secondary | ICD-10-CM

## 2020-08-17 LAB — POCT GLYCOSYLATED HEMOGLOBIN (HGB A1C): Hemoglobin A1C: 6.8 % — AB (ref 4.0–5.6)

## 2020-08-17 MED ORDER — LUBIPROSTONE 24 MCG PO CAPS: 24.0000 ug | ORAL_CAPSULE | Freq: Two times a day (BID) | ORAL | 1 refills | Status: DC

## 2020-08-30 ENCOUNTER — Ambulatory Visit: Payer: Medicare Other | Attending: Anesthesiology | Admitting: Anesthesiology

## 2020-08-30 ENCOUNTER — Other Ambulatory Visit: Payer: Self-pay

## 2020-08-30 ENCOUNTER — Encounter: Payer: Self-pay | Admitting: Anesthesiology

## 2020-08-30 DIAGNOSIS — Z79891 Long term (current) use of opiate analgesic: Secondary | ICD-10-CM | POA: Diagnosis not present

## 2020-08-30 DIAGNOSIS — G8929 Other chronic pain: Secondary | ICD-10-CM

## 2020-08-30 DIAGNOSIS — M79642 Pain in left hand: Secondary | ICD-10-CM

## 2020-08-30 DIAGNOSIS — M25562 Pain in left knee: Secondary | ICD-10-CM | POA: Diagnosis not present

## 2020-08-30 DIAGNOSIS — G894 Chronic pain syndrome: Secondary | ICD-10-CM

## 2020-08-30 DIAGNOSIS — M159 Polyosteoarthritis, unspecified: Secondary | ICD-10-CM | POA: Diagnosis not present

## 2020-08-30 DIAGNOSIS — M25561 Pain in right knee: Secondary | ICD-10-CM | POA: Diagnosis not present

## 2020-08-30 DIAGNOSIS — M79641 Pain in right hand: Secondary | ICD-10-CM | POA: Diagnosis not present

## 2020-08-30 DIAGNOSIS — M545 Low back pain, unspecified: Secondary | ICD-10-CM | POA: Diagnosis not present

## 2020-08-30 MED ORDER — OXYCODONE-ACETAMINOPHEN 7.5-325 MG PO TABS: 1.0000 | ORAL_TABLET | Freq: Three times a day (TID) | ORAL | 0 refills | Status: DC | PRN

## 2020-08-30 NOTE — Progress Notes (Signed)
Virtual Visit via Telephone Note  I connected with Taylor [name] on 08/30/20 at  1:30 PM EDT by telephone and verified that I am speaking with the correct person using two identifiers.  Location: Patient: Home Provider: Pain control center   I discussed the limitations, risks, security and privacy concerns of performing an evaluation and management service by telephone and the availability of in person appointments. I also discussed with the patient that there may be a patient responsible charge related to this service. The patient expressed understanding and agreed to proceed.   History of Present Illness: I spoke with Taylor [name] via telephone today as we are unable to link for the video portion of the virtual conference.  She reports that she is doing reasonably well with her hip low back and knee pain.  No significant changes are noted in the quality characteristic or distribution of these.  She seems to be stable on her medication management and this is working well for her.  She continues to get approximate 50 to 75% pain relief with the medicines lasting 6 to 8 hours.  She is sleeping well at night and no other side effects are reported with the medicines.  No change in bowel bladder function or lower extremity strength or function is noted.  Review of systems: General: No fevers or chills Pulmonary: No shortness of breath or dyspnea Cardiac: No angina or palpitations or lightheadedness GI: No abdominal pain or constipation Psych: No depression    Observations/Objective:  Current Outpatient Medications:    [START ON 10/12/2020] oxyCODONE-acetaminophen (PERCOCET) 7.5-325 MG tablet, Take 1 tablet by mouth every 8 (eight) hours as needed for moderate pain or severe pain., Disp: 90 tablet, Rfl: 0   acetaminophen (TYLENOL) 500 MG tablet, Take 1 tablet (500 mg total) by mouth 3 (three) times daily. (Patient taking differently: Take 500 mg by mouth every 6 (six) hours as needed for moderate pain  or headache.), Disp: 90 tablet, Rfl: 0   amLODipine (NORVASC) 10 MG tablet, TAKE 1 TABLET BY MOUTH EVERY DAY, Disp: 90 tablet, Rfl: 1   brimonidine-timolol (COMBIGAN) 0.2-0.5 % ophthalmic solution, Place 1 drop into both eyes 2 (two) times daily. , Disp: , Rfl:    Calcium Carbonate-Vitamin D (OYSTER SHELL CALCIUM 500 + D PO), Take 1 tablet by mouth 2 (two) times a day., Disp: , Rfl:    denosumab (PROLIA) 60 MG/ML SOSY injection, Inject into the skin., Disp: , Rfl:    dorzolamide (TRUSOPT) 2 % ophthalmic solution, 1 drop 2 (two) times daily., Disp: , Rfl:    famotidine (PEPCID) 20 MG tablet, TAKE 1 TABLET BY MOUTH EVERY DAY, Disp: 90 tablet, Rfl: 1   ferrous sulfate 325 (65 FE) MG tablet, TAKE 1 TABLET BY MOUTH EVERY DAY WITH BREAKFAST, Disp: 90 tablet, Rfl: 1   fluticasone (FLONASE) 50 MCG/ACT nasal spray, SPRAY 2 SPRAYS INTO EACH NOSTRIL EVERY DAY, Disp: 48 mL, Rfl: 1   Lancets (ONETOUCH DELICA PLUS [id number]) MISC, 1 each by Does not apply route daily., Disp: 100 each, Rfl: 2   lubiprostone (AMITIZA) 24 MCG capsule, Take 1 capsule (24 mcg total) by mouth 2 (two) times daily with a meal., Disp: 180 capsule, Rfl: 1   metFORMIN (GLUCOPHAGE-XR) 750 MG 24 hr tablet, TAKE 2 TABLETS (1,500 MG TOTAL) BY MOUTH DAILY WITH BREAKFAST., Disp: 180 tablet, Rfl: 1   omega-3 acid ethyl esters (LOVAZA) 1 g capsule, TAKE 1 CAPSULE BY MOUTH 2 TIMES DAILY., Disp: 180 capsule, Rfl: 1  ONETOUCH ULTRA test strip, USE AS DIRECTED, Disp: 100 strip, Rfl: 12   [START ON 09/12/2020] oxyCODONE-acetaminophen (PERCOCET) 7.5-325 MG tablet, Take 1 tablet by mouth every 8 (eight) hours as needed for moderate pain or severe pain., Disp: 90 tablet, Rfl: 0   pravastatin (PRAVACHOL) 40 MG tablet, TAKE 1 TABLET BY MOUTH EVERYDAY AT BEDTIME, Disp: 90 tablet, Rfl: 1   ROCKLATAN 0.02-0.005 % SOLN, Apply 1 drop to eye at bedtime., Disp: , Rfl:    Assessment and Plan: 1. Chronic pain syndrome   2. Chronic pain of right knee   3. Pain in  both hands   4. Long term prescription opiate use   5. Chronic midline low back pain without sciatica   6. Chronic pain of both knees   7. Osteoarthritis of multiple joints, unspecified osteoarthritis type   Based on her discussion of it is appropriate to continue with her current medication management and this will be done for the next 2 months dated for July 25 in August 24.  I have reviewed the [facility] practitioner database information and it is appropriate.  Ordered continue efforts at stretching strengthening as we reviewed today.  We will have her scheduled for return to clinic in 2 months and continue follow-up with her primary care physicians for baseline medical care.  Follow Up Instructions:    I discussed the assessment and treatment plan with the patient. The patient was provided an opportunity to ask questions and all were answered. The patient agreed with the plan and demonstrated an understanding of the instructions.   The patient was advised to call back or seek an in-person evaluation if the symptoms worsen or if the condition fails to improve as anticipated.  I provided 30 minutes of non-face-to-face time during this encounter.   [name], MD

## 2020-09-05 ENCOUNTER — Emergency Department: Payer: Medicare Other

## 2020-09-05 ENCOUNTER — Emergency Department
Admission: EM | Admit: 2020-09-05 | Discharge: 2020-09-06 | Disposition: A | Discharge status: Home / Self Care | Payer: Medicare Other | Attending: Emergency Medicine | Admitting: Emergency Medicine

## 2020-09-05 ENCOUNTER — Other Ambulatory Visit: Payer: Self-pay

## 2020-09-05 ENCOUNTER — Encounter: Payer: Self-pay | Admitting: Emergency Medicine

## 2020-09-05 DIAGNOSIS — M546 Pain in thoracic spine: Secondary | ICD-10-CM | POA: Diagnosis not present

## 2020-09-05 DIAGNOSIS — N183 Chronic kidney disease, stage 3 unspecified: Secondary | ICD-10-CM | POA: Diagnosis not present

## 2020-09-05 DIAGNOSIS — R2681 Unsteadiness on feet: Secondary | ICD-10-CM | POA: Diagnosis not present

## 2020-09-05 DIAGNOSIS — I6782 Cerebral ischemia: Secondary | ICD-10-CM | POA: Diagnosis not present

## 2020-09-05 DIAGNOSIS — Z79899 Other long term (current) drug therapy: Secondary | ICD-10-CM | POA: Diagnosis not present

## 2020-09-05 DIAGNOSIS — M25551 Pain in right hip: Secondary | ICD-10-CM | POA: Insufficient documentation

## 2020-09-05 DIAGNOSIS — M419 Scoliosis, unspecified: Secondary | ICD-10-CM | POA: Diagnosis not present

## 2020-09-05 DIAGNOSIS — W1830XA Fall on same level, unspecified, initial encounter: Secondary | ICD-10-CM | POA: Insufficient documentation

## 2020-09-05 DIAGNOSIS — S8001XA Contusion of right knee, initial encounter: Secondary | ICD-10-CM | POA: Insufficient documentation

## 2020-09-05 DIAGNOSIS — M25561 Pain in right knee: Secondary | ICD-10-CM | POA: Diagnosis not present

## 2020-09-05 DIAGNOSIS — M47812 Spondylosis without myelopathy or radiculopathy, cervical region: Secondary | ICD-10-CM | POA: Diagnosis not present

## 2020-09-05 DIAGNOSIS — E1122 Type 2 diabetes mellitus with diabetic chronic kidney disease: Secondary | ICD-10-CM | POA: Insufficient documentation

## 2020-09-05 DIAGNOSIS — Z981 Arthrodesis status: Secondary | ICD-10-CM | POA: Diagnosis not present

## 2020-09-05 DIAGNOSIS — I6529 Occlusion and stenosis of unspecified carotid artery: Secondary | ICD-10-CM | POA: Diagnosis not present

## 2020-09-05 DIAGNOSIS — N390 Urinary tract infection, site not specified: Secondary | ICD-10-CM

## 2020-09-05 DIAGNOSIS — Z7984 Long term (current) use of oral hypoglycemic drugs: Secondary | ICD-10-CM | POA: Insufficient documentation

## 2020-09-05 DIAGNOSIS — R600 Localized edema: Secondary | ICD-10-CM | POA: Diagnosis not present

## 2020-09-05 DIAGNOSIS — G9389 Other specified disorders of brain: Secondary | ICD-10-CM | POA: Diagnosis not present

## 2020-09-05 DIAGNOSIS — I129 Hypertensive chronic kidney disease with stage 1 through stage 4 chronic kidney disease, or unspecified chronic kidney disease: Secondary | ICD-10-CM | POA: Diagnosis not present

## 2020-09-05 DIAGNOSIS — I639 Cerebral infarction, unspecified: Secondary | ICD-10-CM | POA: Diagnosis not present

## 2020-09-05 DIAGNOSIS — S199XXA Unspecified injury of neck, initial encounter: Secondary | ICD-10-CM | POA: Diagnosis not present

## 2020-09-05 DIAGNOSIS — I10 Essential (primary) hypertension: Secondary | ICD-10-CM | POA: Diagnosis not present

## 2020-09-05 DIAGNOSIS — S34109A Unspecified injury to unspecified level of lumbar spinal cord, initial encounter: Secondary | ICD-10-CM | POA: Diagnosis present

## 2020-09-05 DIAGNOSIS — S32010A Wedge compression fracture of first lumbar vertebra, initial encounter for closed fracture: Secondary | ICD-10-CM | POA: Diagnosis not present

## 2020-09-05 DIAGNOSIS — R42 Dizziness and giddiness: Secondary | ICD-10-CM | POA: Diagnosis not present

## 2020-09-05 DIAGNOSIS — M545 Low back pain, unspecified: Secondary | ICD-10-CM | POA: Diagnosis not present

## 2020-09-05 DIAGNOSIS — W19XXXA Unspecified fall, initial encounter: Secondary | ICD-10-CM

## 2020-09-05 DIAGNOSIS — S0990XA Unspecified injury of head, initial encounter: Secondary | ICD-10-CM | POA: Diagnosis not present

## 2020-09-05 LAB — URINALYSIS, COMPLETE (UACMP) WITH MICROSCOPIC
Bilirubin Urine: NEGATIVE
Glucose, UA: NEGATIVE mg/dL
Hgb urine dipstick: NEGATIVE
Ketones, ur: NEGATIVE mg/dL
Nitrite: NEGATIVE
Protein, ur: NEGATIVE mg/dL
Specific Gravity, Urine: 1.011 (ref 1.005–1.030)
WBC, UA: >50 WBC/hpf — ABNORMAL HIGH (ref 0–5)
pH: 6 (ref 5.0–8.0)

## 2020-09-05 LAB — BASIC METABOLIC PANEL
Anion gap: 8 (ref 5–15)
BUN: 18 mg/dL (ref 8–23)
CO2: 21 mmol/L — ABNORMAL LOW (ref 22–32)
Calcium: 8.9 mg/dL (ref 8.9–10.3)
Chloride: 111 mmol/L (ref 98–111)
Creatinine, Ser: 1.1 mg/dL — ABNORMAL HIGH (ref 0.44–1.00)
GFR, Estimated: 49 mL/min — ABNORMAL LOW (ref >60)
Glucose, Bld: 194 mg/dL — ABNORMAL HIGH (ref 70–99)
Potassium: 3.6 mmol/L (ref 3.5–5.1)
Sodium: 140 mmol/L (ref 135–145)

## 2020-09-05 LAB — CBC WITH DIFFERENTIAL/PLATELET
Abs Immature Granulocytes: 0.02 10*3/uL (ref 0.00–0.07)
Basophils Absolute: 0 10*3/uL (ref 0.0–0.1)
Basophils Relative: 1 %
Eosinophils Absolute: 0.1 10*3/uL (ref 0.0–0.5)
Eosinophils Relative: 2 %
HCT: 36.9 % (ref 36.0–46.0)
Hemoglobin: 12.4 g/dL (ref 12.0–15.0)
Immature Granulocytes: 0 %
Lymphocytes Relative: 41 %
Lymphs Abs: 2.7 10*3/uL (ref 0.7–4.0)
MCH: 30.8 pg (ref 26.0–34.0)
MCHC: 33.6 g/dL (ref 30.0–36.0)
MCV: 91.8 fL (ref 80.0–100.0)
Monocytes Absolute: 0.6 10*3/uL (ref 0.1–1.0)
Monocytes Relative: 9 %
Neutro Abs: 3.1 10*3/uL (ref 1.7–7.7)
Neutrophils Relative %: 47 %
Platelets: 297 10*3/uL (ref 150–400)
RBC: 4.02 MIL/uL (ref 3.87–5.11)
RDW: 12.9 % (ref 11.5–15.5)
WBC: 6.6 10*3/uL (ref 4.0–10.5)
nRBC: 0 % (ref 0.0–0.2)

## 2020-09-05 LAB — TROPONIN I (HIGH SENSITIVITY)
Troponin I (High Sensitivity): 37 ng/L — ABNORMAL HIGH (ref <18)
Troponin I (High Sensitivity): 31 ng/L — ABNORMAL HIGH (ref <18)

## 2020-09-05 MED ORDER — GADOBUTROL 1 MMOL/ML IV SOLN
8.0000 mL | Freq: Once | INTRAVENOUS | Status: AC | PRN
  Administered 2020-09-05: 8 mL via INTRAVENOUS
  Filled 2020-09-05: qty 8

## 2020-09-05 MED ORDER — ACETAMINOPHEN 500 MG PO TABS
1000.0000 mg | ORAL_TABLET | Freq: Once | ORAL | Status: AC
  Administered 2020-09-05: 1000 mg via ORAL
  Filled 2020-09-05: qty 2

## 2020-09-05 MED ORDER — CEPHALEXIN 500 MG PO CAPS
500.0000 mg | ORAL_CAPSULE | Freq: Three times a day (TID) | ORAL | Status: DC
  Administered 2020-09-05: 500 mg via ORAL
  Filled 2020-09-05: qty 1

## 2020-09-05 MED ORDER — AMLODIPINE BESYLATE 5 MG PO TABS
10.0000 mg | ORAL_TABLET | Freq: Every day | ORAL | Status: DC
  Administered 2020-09-05: 10 mg via ORAL
  Filled 2020-09-05: qty 2

## 2020-09-05 NOTE — ED Notes (Signed)
Pt HOH; EDP Smith back to bedside; no obvious deformities or swelling outside of baseline noted to R hip, knee, or ankle.

## 2020-09-05 NOTE — ED Triage Notes (Signed)
C/O fall last night onto right side.  C/O right lower back and right hip and leg pain.  Patient has been able to stand and walk since fall.

## 2020-09-05 NOTE — ED Notes (Signed)
Pt assisted to use bedside commode as requested. Pt completed per care herself. Will place IV soon.

## 2020-09-05 NOTE — ED Notes (Signed)
This RN to bedside. Pt off unit at imaging. Visitor remains at bedside.

## 2020-09-05 NOTE — Discharge Instructions (Addendum)
It looks like you might have a small UTI.  Please take the Keflex 1 pill 3 times a day to the treat.  He also have a bruise in the soft tissues of your hip but no fractures.  Use Tylenol as needed for the pain.  Please follow-up with your doctor and return as needed.

## 2020-09-05 NOTE — ED Provider Notes (Addendum)
Lab work reviewed she has a low GFR but this is stable.  The troponin is slightly elevated at 37.  We will wait for the second troponin to come back although I do not anticipate that the show anything.  The urinalysis shows what looks like a UTI with a white count greater than 50 in the urine.  MRI of the hip only shows a contusion and the MRI of the head does show a meningioma with some rightward bulging of the anterior falx but no other signs of intracranial problems except for chronic microvascular changes.  We will get the OT PT consult see if she could benefit from some rehab.   Nena Polio, MD 09/05/20 2214 Patient does not want to wait for pt/ot consult. She wants to go home. I will let her. I will give her keflex for possible uti.   Nena Polio, MD 09/06/20 [phone]

## 2020-09-05 NOTE — ED Provider Notes (Addendum)
[facility] Emergency Department Provider Note  ____________________________________________   Event Date/Time   First MD Initiated Contact with Patient 09/05/20 1438     (approximate)  I have reviewed the triage vital signs and the nursing notes.   HISTORY  Chief Complaint Fall   HPI Taylor [name] is a 87 y.o. female with past medical history of HTN, HDL, obesity, shingles, osteoporosis, DM, arthritis, anemia and glaucoma as well as gout who presents accompanied by family for assessment after a fall that occurred last night.  Patient states she has had months of some dizziness and uses a walker and a cane to get around at home.  She states she thinks he turned too quickly and possibly caught the edge of a rug before falling.  She states she did hit her head and fell onto her right hip.  She states she currently not have any headache and does not think she had LOC.  She denies any neck pain but endorses some middle back pain as well as at the right hip.  She also think she may have hit her knee as she has some soreness in her right knee.  She denies taking any blood thinners.  She denies any other recent falls and states the dizziness has been going on for at least several months.  No recent chest pain, cough, shortness of breath, abdominal pain, vomiting, diarrhea, dysuria, rash or other recent sick symptoms.  She states she is coming into emergency room today primarily because her right hip has been bothering her.  She has not taken any analgesia today.  She states she is little sore in her right forearm because she had to scoot herself on the floor for a little bit after she fell.         Past Medical History:  Diagnosis Date   Abnormal mammogram, unspecified 2013   Anemia    Arthritis    Breast screening, unspecified 2013   Diabetes mellitus without complication ([place]) 4628   non insulin dependent   Dysrhythmia    IRREGULAR HEART BEAT   GERD  (gastroesophageal reflux disease)    Glaucoma 2003   Gout    History of kidney stones    H/O   Hyperlipidemia 2008   Hypertension 1980's   Lump or mass in breast 01/03/2012   left breast   Obesity, unspecified 2013   Osteoporosis    Shingles 2013   Special screening for malignant neoplasms, colon 2013    Patient Active Problem List   Diagnosis Date Noted   Gastric polyp    Polyp of colon    Morbid obesity ([place]) 04/08/2018   Age-related osteoporosis without current pathological fracture 09/26/2017   Pain in both hands 09/23/2017   Hyperparathyroidism ([place]) 08/06/2017   Chronic kidney disease, stage III (moderate) ([place]) 08/06/2017   HLD (hyperlipidemia) 07/25/2017   Osteoporotic compression fracture of spine ([place]) 03/22/2017   Assistance needed with transportation 03/21/2017   Iron deficiency anemia 12/19/2016   Aortic atherosclerosis ([place]) 11/23/2016   Anterolisthesis 11/23/2016   Chronic pain syndrome 09/13/2016   Vitamin D deficiency 08/19/2016   Anemia, unspecified 08/19/2016   Primary open angle glaucoma (POAG) of both eyes, severe stage 08/17/2016   Hypertensive retinopathy 08/17/2016   Long term current use of opiate analgesic 07/19/2016   Dyslipidemia associated with type 2 diabetes mellitus ([place]) 04/16/2016   Chronic midline low back pain without sciatica 04/16/2016   Chronic pain of right knee 04/16/2016  Vaginal dryness 02/22/2015   Calculus of kidney 11/10/2014   Osteoarthritis, multiple sites 09/09/2014   Type 2 diabetes mellitus with microalbuminuria ([place]) 09/09/2014   Glaucoma 09/09/2014   Benign hypertension with CKD (chronic kidney disease) stage III ([place]) 09/09/2014   Chronic pain 09/09/2014   Tinea corporis 89/37/3428   Umbilical hernia 76/81/1572   Mass of right breast     Past Surgical History:  Procedure Laterality Date   ABDOMINAL HYSTERECTOMY  1958   menorrhagia   BACK SURGERY     BREAST BIOPSY Left 01/25/2012   BREAST BIOPSY Right  04/08/2019   hear clip, [place] bx, path pending   BREAST EXCISIONAL BIOPSY     BREAST LUMPECTOMY WITH NEEDLE LOCALIZATION Right 05/12/2019   Procedure: BREAST LUMPECTOMY WITH NEEDLE LOCALIZATION;  Surgeon: Olean Ree, MD;  Location: [facility] ORS;  Service: General;  Laterality: Right;   CATARACT EXTRACTION, BILATERAL Bilateral    1st 06/07/15 and the 2nd 06/21/15   CHOLECYSTECTOMY     COLONOSCOPY  2003   [facility]   COLONOSCOPY WITH PROPOFOL N/A 07/02/2019   Procedure: COLONOSCOPY WITH PROPOFOL;  Surgeon: Virgel Manifold, MD;  Location: [facility] ENDOSCOPY;  Service: Endoscopy;  Laterality: N/A;   COLONOSCOPY WITH PROPOFOL N/A 07/16/2019   Procedure: COLONOSCOPY WITH PROPOFOL;  Surgeon: Virgel Manifold, MD;  Location: [facility] ENDOSCOPY;  Service: Endoscopy;  Laterality: N/A;   ESOPHAGOGASTRODUODENOSCOPY (EGD) WITH PROPOFOL N/A 07/02/2019   Procedure: ESOPHAGOGASTRODUODENOSCOPY (EGD) WITH PROPOFOL;  Surgeon: Virgel Manifold, MD;  Location: [facility] ENDOSCOPY;  Service: Endoscopy;  Laterality: N/A;   EYE SURGERY     KNEE SURGERY  2009,2011,2012   twice on right and once on left   [place] SURGERY  2004   TONSILLECTOMY     age of 3    Prior to Admission medications   Medication Sig Start Date End Date Taking? Authorizing Provider  acetaminophen (TYLENOL) 500 MG tablet Take 1 tablet (500 mg total) by mouth 3 (three) times daily. Patient taking differently: Take 500 mg by mouth every 6 (six) hours as needed for moderate pain or headache. 12/12/15   [name], MD  amLODipine (NORVASC) 10 MG tablet TAKE 1 TABLET BY MOUTH EVERY DAY 05/03/20   Ancil Boozer, Drue Stager, MD  brimonidine-timolol (COMBIGAN) 0.2-0.5 % ophthalmic solution Place 1 drop into both eyes 2 (two) times daily.     [provider]  Calcium Carbonate-Vitamin D (OYSTER SHELL CALCIUM 500 + D PO) Take 1 tablet by mouth 2 (two) times a day.    [provider]  denosumab (PROLIA) 60 MG/ML  SOSY injection Inject into the skin. 05/25/19   [provider]  dorzolamide (TRUSOPT) 2 % ophthalmic solution 1 drop 2 (two) times daily. 03/30/20   [provider]  famotidine (PEPCID) 20 MG tablet TAKE 1 TABLET BY MOUTH EVERY DAY 08/12/20   [name], MD  ferrous sulfate 325 (65 FE) MG tablet TAKE 1 TABLET BY MOUTH EVERY DAY WITH BREAKFAST 09/20/19   Ancil Boozer, Drue Stager, MD  fluticasone [facility]) 50 MCG/ACT nasal spray SPRAY 2 SPRAYS INTO EACH NOSTRIL EVERY DAY 12/08/19   Ancil Boozer, Drue Stager, MD  Lancets [facility] DELICA PLUS [id number]) [place] 1 each by Does not apply route daily. 12/14/19   [name], MD  lubiprostone (AMITIZA) 24 MCG capsule Take 1 capsule (24 mcg total) by mouth 2 (two) times daily with a meal. 08/17/20   Ancil Boozer, Drue Stager, MD  metFORMIN (GLUCOPHAGE-XR) 750 MG 24  hr tablet TAKE 2 TABLETS (1,500 MG TOTAL) BY MOUTH DAILY WITH BREAKFAST. 08/12/20   Sowles, Drue Stager, MD  omega-3 acid ethyl esters (LOVAZA) 1 g capsule TAKE 1 CAPSULE BY MOUTH 2 TIMES DAILY. 05/03/20   [name], MD  [facility] ULTRA test strip USE AS DIRECTED 05/03/20   [name], MD  oxyCODONE-acetaminophen (PERCOCET) 7.5-325 MG tablet Take 1 tablet by mouth every 8 (eight) hours as needed for moderate pain or severe pain. 09/12/20 10/12/20  [name], MD  oxyCODONE-acetaminophen (PERCOCET) 7.5-325 MG tablet Take 1 tablet by mouth every 8 (eight) hours as needed for moderate pain or severe pain. 10/12/20 11/11/20  [name], MD  pravastatin (PRAVACHOL) 40 MG tablet TAKE 1 TABLET BY MOUTH EVERYDAY AT BEDTIME 07/15/20   Sowles, Drue Stager, MD  ROCKLATAN 0.02-0.005 % SOLN Apply 1 drop to eye at bedtime. 08/18/19   [provider]    Allergies Ace inhibitors  Family History  Problem Relation Age of Onset   Cancer Mother        skin cancer   Stroke Mother    Heart disease Father    Breast cancer Neg Hx     Social History Social History   Tobacco Use   Smoking status: Never    Smokeless tobacco: Never  Vaping Use   Vaping Use: Never used  Substance Use Topics   Alcohol use: No    Alcohol/week: 0.0 standard drinks   Drug use: No    Review of Systems  Review of Systems  Constitutional:  Negative for chills and fever.  HENT:  Negative for sore throat.   Eyes:  Negative for pain.  Respiratory:  Negative for cough and stridor.   Cardiovascular:  Negative for chest pain.  Gastrointestinal:  Negative for vomiting.  Genitourinary:  Negative for dysuria.  Musculoskeletal:  Positive for back pain and joint pain (R hip, R knee).  Skin:  Negative for rash.  Neurological:  Positive for dizziness and focal weakness (R hip). Negative for seizures, loss of consciousness and headaches.  Psychiatric/Behavioral:  Negative for suicidal ideas.   All other systems reviewed and are negative.    ____________________________________________   PHYSICAL EXAM:  VITAL SIGNS: ED Triage Vitals  Enc Vitals Group     BP 09/05/20 1423 (!) 170/70     Pulse Rate 09/05/20 1423 70     Resp 09/05/20 1423 16     Temp 09/05/20 1423 98.3 F (36.8 C)     Temp Source 09/05/20 1423 Oral     SpO2 09/05/20 1423 97 %     Weight 09/05/20 1421 181 lb 14.1 oz (82.5 kg)     Height 09/05/20 1421 5\' 1"  (1.549 m)     Head Circumference --      Peak Flow --      Pain Score --      Pain Loc --      Pain Edu? --      Excl. in [place]? --    Vitals:   09/05/20 1830 09/05/20 1903  BP: (!) 178/99 (!) 196/81  Pulse:  78  Resp:    Temp:    SpO2:     Physical Exam Vitals and nursing note reviewed.  Constitutional:      General: She is not in acute distress.    Appearance: She is well-developed.  HENT:     Head: Normocephalic and atraumatic.     Right Ear: External ear normal.     Left Ear: External  ear normal.     Nose: Nose normal.  Eyes:     Conjunctiva/sclera: Conjunctivae normal.  Cardiovascular:     Rate and Rhythm: Normal rate and regular rhythm.     Heart sounds: No murmur  heard. Pulmonary:     Effort: Pulmonary effort is normal. No respiratory distress.     Breath sounds: Normal breath sounds.  Abdominal:     Palpations: Abdomen is soft.     Tenderness: There is no abdominal tenderness.  Musculoskeletal:     Cervical back: Neck supple.  Skin:    General: Skin is warm and dry.  Neurological:     Mental Status: She is alert and oriented to person, place, and time.  Psychiatric:        Mood and Affect: Mood normal.    Cranial nerves II through XII grossly intact.  No pronator drift.  Patient has symmetric strength in her bilateral upper extremities and throughout the left lower extremity.  She is somewhat weak in the right hip and has on ranging of the right hip on active range of motion of the right knee.  2+ radial and DP pulses.  Sensation intact light touch lower extremities.  No tenderness step-offs deformities over the C-spine but there is some tenderness over the T and L-spine.  No other significant tenderness or lesions over the back chest or abdomen.  No other areas of tenderness effusion or decreased limitation range of motion in the bilateral upper extremities or left lower extremity or at the right ankle.  Mild tenderness of the right knee and small joint effusion without any other joint abnormalities noted. ____________________________________________   LABS (all labs ordered are listed, but only abnormal results are displayed)  Labs Reviewed  CBC WITH DIFFERENTIAL/PLATELET  BASIC METABOLIC PANEL  URINALYSIS, COMPLETE (UACMP) WITH MICROSCOPIC  TROPONIN I (HIGH SENSITIVITY)   ____________________________________________  EKG  Sinus rhythm with ventricular to 74, normal axis, unremarkable intervals with isolated nonspecific change in lead III without any other clearance of acute ischemia or significant arrhythmia. ____________________________________________  RADIOLOGY  ED MD interpretation: CT head shows no evidence of acute cranial  hemorrhage, skull fracture or CVA but there is a hyperdense lesion with some peripheral calcification of the anterior left frontal lobe measuring 3.1 x 2.9 x 3.6 cm.  This causes some mass-effect on the subjacent parenchyma and mild associated left-to-right midline shift.  Likely meningioma.  There is also evidence of chronic appearing small chronic vessel ischemic changes and a remote infarct in both basal ganglia.  CT C-spine shows no fracture dislocation or subluxation.  There is some multilevel degenerative changes noted.  Plain film of the T-spine shows no fracture dislocation with some degenerative changes noted.  Plain film of the L-spine shows L1 compression deformity possibly chronic although patient does have some tenderness here so this could certainly represent an acute fracture as well.  No other clear acute abnormalities noted.  Plain film of the right hip shows no fracture or dislocation.  Plain film of the right knee shows patient is status post right knee replacement without any fracture or dislocation.  Official radiology report(s): DG Thoracic Spine 2 View  Result Date: 09/05/2020 CLINICAL DATA:  Fall, right-sided back pain EXAM: THORACIC SPINE 2 VIEWS COMPARISON:  Chest x-ray 03/21/2017 FINDINGS: There is no evidence of thoracic spine fracture. No static listhesis. Inferior endplate compression again noted within the upper lumbar spine. Multilevel intervertebral disc height loss throughout the thoracic spine. Chronic deformity of the posterior  right eighth rib. No other significant bone abnormalities are identified. IMPRESSION: 1. No evidence of thoracic spine fracture or static listhesis. 2. Multilevel degenerative disc disease throughout the thoracic spine. Electronically Signed   By: Davina Poke D.O.   On: 09/05/2020 16:30   DG Lumbar Spine Complete  Result Date: 09/05/2020 CLINICAL DATA:  Fall yesterday with low back pain, initial encounter EXAM: LUMBAR SPINE - COMPLETE 4+  VIEW COMPARISON:  05/27/2004 FINDINGS: Five lumbar type vertebral bodies are well visualized. Vertebral body height is well maintained with the exception of inferior endplate deformity at L1 with mild height loss. Facet hypertrophic changes are noted. Postsurgical changes are noted at L5-S2. No pars defects are noted. Mild osteophytic changes are seen. No soft tissue abnormality is noted. IMPRESSION: L1 compression deformity which appears chronic in nature. Postsurgical changes stable from prior exam. Electronically Signed   By: Inez Catalina M.D.   On: 09/05/2020 16:31   CT Head Wo Contrast  Result Date: 09/05/2020 CLINICAL DATA:  Head trauma.  Fall last night onto right side. EXAM: CT HEAD WITHOUT CONTRAST TECHNIQUE: Contiguous axial images were obtained from the base of the skull through the vertex without intravenous contrast. COMPARISON:  None. FINDINGS: Brain: No acute intracranial hemorrhage. There is a hyperdense lesion with peripheral calcification measuring 3.1 x 2.9 x 3.6 cm in the anterior left frontal lobe which appears extra-axial with dural base. This causes mass effect on the subjacent parenchyma and mild associated left right midline shift. Slight cortical thickening of the overlying inner table of calvarium. No significant surrounding edema by CT. Brain volume is normal for age with mild periventricular chronic small vessel ischemia. There are remote lacunar infarcts in both basal ganglia. No hydrocephalus. Basilar cisterns are patent. Vascular: Atherosclerosis of skullbase vasculature without hyperdense vessel or abnormal calcification. Skull: No fracture. Sinuses/Orbits: Paranasal sinuses and mastoid air cells are clear. The visualized orbits are unremarkable. Bilateral cataract resection Other: None. IMPRESSION: 1. No acute intracranial abnormality. No skull fracture. 2. Hyperdense lesion with peripheral calcification in the anterior left frontal lobe measuring 3.1 x 2.9 x 3.6 cm. This causes  mass effect on the subjacent parenchyma and mild associated left-to-right midline shift. This is likely a meningioma, however recommend brain MRI (with and without IV contrast in the absence of contraindication) for further characterization. No prior exams are available to assess for stability. 3. Mild chronic small vessel ischemia and remote lacunar infarcts in both basal ganglia. Electronically Signed   By: Keith Rake M.D.   On: 09/05/2020 15:46   CT Cervical Spine Wo Contrast  Result Date: 09/05/2020 CLINICAL DATA:  Neck trauma, fall last night onto right side. EXAM: CT CERVICAL SPINE WITHOUT CONTRAST TECHNIQUE: Multidetector CT imaging of the cervical spine was performed without intravenous contrast. Multiplanar CT image reconstructions were also generated. COMPARISON:  None. FINDINGS: Alignment: No traumatic subluxation. There is minimal broad-based levo scoliotic curvature. Skull base and vertebrae: No acute fracture. Vertebral body heights are maintained. The dens and skull base are intact. Moderate C1-C2 degenerative change with pannus formation. Non fusion of the posterior elements of C6, variant anatomy. Soft tissues and spinal canal: No prevertebral fluid or swelling. No visible canal hematoma. Disc levels: Diffuse degenerative disc disease, most prominently affecting C3-C4 where there is mild mass effect on the spinal canal. Multilevel facet hypertrophy. Multilevel neural foraminal stenosis. Upper chest: Nonacute. Other: Carotid calcifications. IMPRESSION: 1. No acute fracture or subluxation of the cervical spine. 2. Multilevel degenerative disc disease and facet hypertrophy.  Electronically Signed   By: Keith Rake M.D.   On: 09/05/2020 15:50   DG Knee Complete 4 Views Right  Result Date: 09/05/2020 CLINICAL DATA:  Fall with right knee pain, initial encounter EXAM: RIGHT KNEE - COMPLETE 4+ VIEW COMPARISON:  None. FINDINGS: Right knee prosthesis is noted. No acute fracture or  dislocation is seen. No soft tissue abnormality is noted. IMPRESSION: Status post right knee replacement.  No acute abnormality noted. Electronically Signed   By: Inez Catalina M.D.   On: 09/05/2020 16:32   DG Hip Unilat W or Wo Pelvis 2-3 Views Right  Result Date: 09/05/2020 CLINICAL DATA:  Recent fall with right hip pain, initial encounter EXAM: DG HIP (WITH OR WITHOUT PELVIS) 3V RIGHT COMPARISON:  None. FINDINGS: Postsurgical changes are noted at the lumbosacral junction. Pelvic ring is intact. No acute fracture or dislocation is noted. No soft tissue abnormality is seen. IMPRESSION: No acute abnormality noted. Electronically Signed   By: Inez Catalina M.D.   On: 09/05/2020 16:34    ____________________________________________   PROCEDURES  Procedure(s) performed (including Critical Care):  Procedures   ____________________________________________   INITIAL IMPRESSION / ASSESSMENT AND PLAN / ED COURSE      Patient presents with above-stated history exam for assessment of some right hip pain after a fall that occurred last night in the setting of some chronic dizziness appearing stating she typically uses a walker or cane to get around and think she may have caught the side of her rug.  She does not think her dizziness and using usual did not have any other presyncopal or associated sick symptoms.  She think she hit back of her head but does not currently have a headache and did not have any LOC.  She denies being anticoagulated.  On arrival she is hypertensive with BP of 170/70 with otherwise stable vital signs on room air.  On exam she has some tenderness over her T and L-spine as well as pain on ranging the right hip.  There is also a little bit tenderness over the right knee and some weakness here as well but no other significant overlying skin changes.  Remainder of extremity exam is unremarkable.  Overall it seems this is likely mechanical in the setting of some chronic unsteadiness.   There are no obvious new focal deficits to suggest CVA.  She denies any chest pain or shortness of breath dizziness or other symptoms to suggest a cardiac or pulmonary etiology for fall.  Patient has no significant arrhythmia identified on ECG as well as isolated nonspecific change in lead III no other findings to suggest ACS or myocarditis.  Given her age I did obtain head CT head and C-spine.  I will also order plain films of the T and L-spine as well as the right hip and right knee.  CT head shows no evidence of acute cranial hemorrhage, skull fracture or CVA but there is a hyperdense lesion with some peripheral calcification of the anterior left frontal lobe measuring 3.1 x 2.9 x 3.6 cm.  This causes some mass-effect on the subjacent parenchyma and mild associated left-to-right midline shift.  Likely meningioma.  There is also evidence of chronic appearing small chronic vessel ischemic changes and a remote infarct in both basal ganglia.  CT C-spine shows no fracture dislocation or subluxation.  There is some multilevel degenerative changes noted.  Plain film of the T-spine shows no fracture dislocation with some degenerative changes noted.  Plain film of the L-spine  shows L1 compression deformity possibly chronic although patient does have some tenderness here so this could certainly represent an acute fracture as well.  No other clear acute abnormalities noted.  Plain film of the right hip shows no fracture or dislocation.  Plain film of the right knee shows patient is status post right knee replacement without any fracture or dislocation.  Given some unexpected finding of mass calcified causing some mild left-to-right shift on CT head will obtain MRI brain as recommended by radiology.  While awaiting this study we will also see if patient is able to ambulate with a walker she states she is able to do at home.  Patient very unsteady on standing from bed and using walker and unable to use walker without  assistance.  She states she is just feeling very dizzy and unsteady.  While I have a low suspicion for symptomatic anemia or significant metabolic derangements we will add a CBC and a BMP as well as a UA to assess for possible other occult causes patient's dizziness.  We will also add a troponin given nonspecific finding on ECG although I have a fairly low suspicion for ACS at this time.  I discussed with on-call neurosurgeon Dr. Lacinda Axon findings on CT and he agreed with plan for MR but recommended adding MRA of the head and neck as well to assess for other possible causes of her dizziness.  Care patient signed over to assuming physician approximately 8 PM.  Plan to follow-up MRI, trop, cbc, bmp, and ua.  If no indication for hospitalization I think patient will benefit from PT and OT evaluations in the emergency room as she will likely need to either be placed short-term rehab or have significant home health and PT while undergoing further outpatient work-up for her dizziness.      ____________________________________________   FINAL CLINICAL IMPRESSION(S) / ED DIAGNOSES  Final diagnoses:  Fall, initial encounter  Closed compression fracture of body of L1 vertebra (HCC)  Right hip pain  Contusion of right knee, initial encounter  Unsteady gait    Medications  amLODipine (NORVASC) tablet 10 mg (10 mg Oral Given 09/05/20 1740)  acetaminophen (TYLENOL) tablet 1,000 mg (1,000 mg Oral Given 09/05/20 1523)     ED Discharge Orders     None        Note:  This document was prepared using Dragon voice recognition software and may include unintentional dictation errors.    Lucrezia Starch, MD 09/05/20 1954    Lucrezia Starch, MD 09/05/20 2123

## 2020-09-05 NOTE — ED Notes (Signed)
Pt denies CP

## 2020-09-05 NOTE — ED Notes (Addendum)
Pt reports she usually takes 20mg  of amlodipine per day. EDP Smith notified in person. States will see what 10mg  does for now.

## 2020-09-06 MED ORDER — CEPHALEXIN 500 MG PO CAPS: 500.0000 mg | ORAL_CAPSULE | Freq: Three times a day (TID) | ORAL | 0 refills | Status: AC

## 2020-09-08 LAB — URINE CULTURE: Culture: >=100000 — AB

## 2020-09-21 DIAGNOSIS — Z961 Presence of intraocular lens: Secondary | ICD-10-CM | POA: Diagnosis not present

## 2020-09-21 DIAGNOSIS — H35372 Puckering of macula, left eye: Secondary | ICD-10-CM | POA: Diagnosis not present

## 2020-09-21 DIAGNOSIS — H401133 Primary open-angle glaucoma, bilateral, severe stage: Secondary | ICD-10-CM | POA: Diagnosis not present

## 2020-09-22 ENCOUNTER — Telehealth: Payer: Self-pay

## 2020-09-22 NOTE — Telephone Encounter (Signed)
Copied from [place] [phone] [id number]. Topic: General - Other >> Sep 22, 2020  3:57 PM Leward Quan A wrote: Reason for CRM: Patient called in to speak to Dr Ancil Boozer or her nurse say that she fell a few days ago and was informed by the Dr at [facility] she need to get some Geiger because she is off balance . Asking for a call back Ph# [phone]

## 2020-09-22 NOTE — Telephone Encounter (Signed)
Made appt with Taylor [name] PCP did not have anythinng. Appt is 09-23-2020

## 2020-09-23 ENCOUNTER — Other Ambulatory Visit: Payer: Self-pay

## 2020-09-23 ENCOUNTER — Telehealth: Payer: Self-pay | Admitting: *Deleted

## 2020-09-23 ENCOUNTER — Ambulatory Visit (INDEPENDENT_AMBULATORY_CARE_PROVIDER_SITE_OTHER): Payer: Medicare Other | Admitting: Family Medicine

## 2020-09-23 ENCOUNTER — Encounter: Payer: Self-pay | Admitting: Family Medicine

## 2020-09-23 ENCOUNTER — Ambulatory Visit: Payer: Medicare Other | Admitting: Family Medicine

## 2020-09-23 VITALS — BP 122/64 | HR 91 | Temp 98.2°F | Resp 16 | Ht 60.0 in | Wt 184.0 lb

## 2020-09-23 DIAGNOSIS — Z87440 Personal history of urinary (tract) infections: Secondary | ICD-10-CM

## 2020-09-23 DIAGNOSIS — S32010A Wedge compression fracture of first lumbar vertebra, initial encounter for closed fracture: Secondary | ICD-10-CM

## 2020-09-23 DIAGNOSIS — R269 Unspecified abnormalities of gait and mobility: Secondary | ICD-10-CM | POA: Diagnosis not present

## 2020-09-23 DIAGNOSIS — M4800 Spinal stenosis, site unspecified: Secondary | ICD-10-CM | POA: Diagnosis not present

## 2020-09-23 DIAGNOSIS — R2689 Other abnormalities of gait and mobility: Secondary | ICD-10-CM | POA: Diagnosis not present

## 2020-09-23 DIAGNOSIS — W19XXXD Unspecified fall, subsequent encounter: Secondary | ICD-10-CM

## 2020-09-23 DIAGNOSIS — R300 Dysuria: Secondary | ICD-10-CM | POA: Diagnosis not present

## 2020-09-23 DIAGNOSIS — R350 Frequency of micturition: Secondary | ICD-10-CM | POA: Diagnosis not present

## 2020-09-23 DIAGNOSIS — Z9181 History of falling: Secondary | ICD-10-CM | POA: Diagnosis not present

## 2020-09-23 NOTE — Progress Notes (Signed)
Patient ID: Taylor [name], female    DOB: 1933-01-14, 87 y.o.   MRN: [id number]  PCP: [name], MD  No chief complaint on file.   Subjective:   Taylor [name] is a 87 y.o. female, presents to clinic with CC of the following:  HPI  Here to get [facility] referral per recommendations from specialists due to changes in balance gait and mobility  Fall on 09/05/2020, consulting with neurosurgery  Encounter was closed - and new encounter created due to pt running late - for [facility] order see other encounter documentation  Patient Active Problem List   Diagnosis Date Noted   Gastric polyp    Polyp of colon    Morbid obesity ([place]) 04/08/2018   Age-related osteoporosis without current pathological fracture 09/26/2017   Pain in both hands 09/23/2017   Hyperparathyroidism ([place]) 08/06/2017   Chronic kidney disease, stage III (moderate) ([place]) 08/06/2017   HLD (hyperlipidemia) 07/25/2017   Osteoporotic compression fracture of spine ([place]) 03/22/2017   Assistance needed with transportation 03/21/2017   Iron deficiency anemia 12/19/2016   Aortic atherosclerosis ([place]) 11/23/2016   Anterolisthesis 11/23/2016   Chronic pain syndrome 09/13/2016   Vitamin D deficiency 08/19/2016   Anemia, unspecified 08/19/2016   Primary open angle glaucoma (POAG) of both eyes, severe stage 08/17/2016   Hypertensive retinopathy 08/17/2016   Long term current use of opiate analgesic 07/19/2016   Dyslipidemia associated with type 2 diabetes mellitus ([place]) 04/16/2016   Chronic midline low back pain without sciatica 04/16/2016   Chronic pain of right knee 04/16/2016   Vaginal dryness 02/22/2015   Calculus of kidney 11/10/2014   Osteoarthritis, multiple sites 09/09/2014   Type 2 diabetes mellitus with microalbuminuria ([place]) 09/09/2014   Glaucoma 09/09/2014   Benign hypertension with CKD (chronic kidney disease) stage III ([place]) 09/09/2014   Chronic pain 09/09/2014   Tinea corporis 123456   Umbilical hernia  [id number]   Mass of right breast       Current Outpatient Medications:    acetaminophen (TYLENOL) 500 MG tablet, Take 1 tablet (500 mg total) by mouth 3 (three) times daily. (Patient taking differently: Take 500 mg by mouth every 6 (six) hours as needed for moderate pain or headache.), Disp: 90 tablet, Rfl: 0   amLODipine (NORVASC) 10 MG tablet, TAKE 1 TABLET BY MOUTH EVERY DAY, Disp: 90 tablet, Rfl: 1   brimonidine-timolol (COMBIGAN) 0.2-0.5 % ophthalmic solution, Place 1 drop into both eyes 2 (two) times daily. , Disp: , Rfl:    Calcium Carbonate-Vitamin D (OYSTER SHELL CALCIUM 500 + D PO), Take 1 tablet by mouth 2 (two) times a day., Disp: , Rfl:    denosumab (PROLIA) 60 MG/ML SOSY injection, Inject into the skin., Disp: , Rfl:    dorzolamide (TRUSOPT) 2 % ophthalmic solution, 1 drop 2 (two) times daily., Disp: , Rfl:    famotidine (PEPCID) 20 MG tablet, TAKE 1 TABLET BY MOUTH EVERY DAY, Disp: 90 tablet, Rfl: 1   ferrous sulfate 325 (65 FE) MG tablet, TAKE 1 TABLET BY MOUTH EVERY DAY WITH BREAKFAST, Disp: 90 tablet, Rfl: 1   fluticasone (FLONASE) 50 MCG/ACT nasal spray, SPRAY 2 SPRAYS INTO EACH NOSTRIL EVERY DAY, Disp: 48 mL, Rfl: 1   Lancets (ONETOUCH DELICA PLUS Q000111Q) MISC, 1 each by Does not apply route daily., Disp: 100 each, Rfl: 2   lubiprostone (AMITIZA) 24 MCG capsule, Take 1 capsule (24 mcg total) by mouth 2 (two) times daily with a meal., Disp: 180 capsule, Rfl:  1   metFORMIN (GLUCOPHAGE-XR) 750 MG 24 hr tablet, TAKE 2 TABLETS (1,500 MG TOTAL) BY MOUTH DAILY WITH BREAKFAST., Disp: 180 tablet, Rfl: 1   omega-3 acid ethyl esters (LOVAZA) 1 g capsule, TAKE 1 CAPSULE BY MOUTH 2 TIMES DAILY., Disp: 180 capsule, Rfl: 1   ONETOUCH ULTRA test strip, USE AS DIRECTED, Disp: 100 strip, Rfl: 12   oxyCODONE-acetaminophen (PERCOCET) 7.5-325 MG tablet, Take 1 tablet by mouth every 8 (eight) hours as needed for moderate pain or severe pain., Disp: 90 tablet, Rfl: 0   [START ON 10/12/2020]  oxyCODONE-acetaminophen (PERCOCET) 7.5-325 MG tablet, Take 1 tablet by mouth every 8 (eight) hours as needed for moderate pain or severe pain., Disp: 90 tablet, Rfl: 0   pravastatin (PRAVACHOL) 40 MG tablet, TAKE 1 TABLET BY MOUTH EVERYDAY AT BEDTIME, Disp: 90 tablet, Rfl: 1   ROCKLATAN 0.02-0.005 % SOLN, Apply 1 drop to eye at bedtime., Disp: , Rfl:    Allergies  Allergen Reactions   Ace Inhibitors Cough     Social History   Tobacco Use   Smoking status: Never   Smokeless tobacco: Never  Vaping Use   Vaping Use: Never used  Substance Use Topics   Alcohol use: No    Alcohol/week: 0.0 standard drinks   Drug use: No      Chart Review Today:   Review of Systems     Objective:   There were no vitals filed for this visit.  There is no height or weight on file to calculate BMI.  Physical Exam   Results for orders placed or performed during the hospital encounter of 09/05/20  Urine Culture   Specimen: Urine, Random  Result Value Ref Range   Specimen Description      URINE, RANDOM Performed at [facility], [address] [place], [place] [address]    Special Requests      NONE Performed at [facility], [address] [place], [place] [address]    Culture >=100,000 COLONIES/mL KLEBSIELLA PNEUMONIAE (A)    Report Status 09/08/2020 FINAL    Organism ID, Bacteria KLEBSIELLA PNEUMONIAE (A)       Susceptibility   Klebsiella pneumoniae - MIC*    AMPICILLIN RESISTANT Resistant     CEFAZOLIN <=4 SENSITIVE Sensitive     CEFEPIME <=0.12 SENSITIVE Sensitive     CEFTRIAXONE <=0.25 SENSITIVE Sensitive     CIPROFLOXACIN <=0.25 SENSITIVE Sensitive     GENTAMICIN <=1 SENSITIVE Sensitive     IMIPENEM <=0.25 SENSITIVE Sensitive     NITROFURANTOIN 32 SENSITIVE Sensitive     TRIMETH/SULFA <=20 SENSITIVE Sensitive     AMPICILLIN/SULBACTAM <=2 SENSITIVE Sensitive     PIP/TAZO <=4 SENSITIVE Sensitive     * >=100,000 COLONIES/mL KLEBSIELLA PNEUMONIAE  CBC  with Differential  Result Value Ref Range   WBC 6.6 4.0 - 10.5 K/uL   RBC 4.02 3.87 - 5.11 MIL/uL   Hemoglobin 12.4 12.0 - 15.0 g/dL   HCT 36.9 36.0 - 46.0 %   MCV 91.8 80.0 - 100.0 fL   MCH 30.8 26.0 - 34.0 pg   MCHC 33.6 30.0 - 36.0 g/dL   RDW 12.9 11.5 - 15.5 %   Platelets 297 150 - 400 K/uL   nRBC 0.0 0.0 - 0.2 %   Neutrophils Relative % 47 %   Neutro Abs 3.1 1.7 - 7.7 K/uL   Lymphocytes Relative 41 %   Lymphs Abs 2.7 0.7 - 4.0 K/uL   Monocytes Relative 9 %  Monocytes Absolute 0.6 0.1 - 1.0 K/uL   Eosinophils Relative 2 %   Eosinophils Absolute 0.1 0.0 - 0.5 K/uL   Basophils Relative 1 %   Basophils Absolute 0.0 0.0 - 0.1 K/uL   Immature Granulocytes 0 %   Abs Immature Granulocytes 0.02 0.00 - 0.07 K/uL  Basic metabolic panel  Result Value Ref Range   Sodium 140 135 - 145 mmol/L   Potassium 3.6 3.5 - 5.1 mmol/L   Chloride 111 98 - 111 mmol/L   CO2 21 (L) 22 - 32 mmol/L   Glucose, Bld 194 (H) 70 - 99 mg/dL   BUN 18 8 - 23 mg/dL   Creatinine, Ser 1.10 (H) 0.44 - 1.00 mg/dL   Calcium 8.9 8.9 - 10.3 mg/dL   GFR, Estimated 49 (L) >60 mL/min   Anion gap 8 5 - 15  Urinalysis, Complete w Microscopic  Result Value Ref Range   Color, Urine YELLOW (A) YELLOW   APPearance CLOUDY (A) CLEAR   Specific Gravity, Urine 1.011 1.005 - 1.030   pH 6.0 5.0 - 8.0   Glucose, UA NEGATIVE NEGATIVE mg/dL   Hgb urine dipstick NEGATIVE NEGATIVE   Bilirubin Urine NEGATIVE NEGATIVE   Ketones, ur NEGATIVE NEGATIVE mg/dL   Protein, ur NEGATIVE NEGATIVE mg/dL   Nitrite NEGATIVE NEGATIVE   Leukocytes,Ua LARGE (A) NEGATIVE   RBC / HPF 0-5 0 - 5 RBC/hpf   WBC, UA >50 (H) 0 - 5 WBC/hpf   Bacteria, UA MANY (A) NONE SEEN   Squamous Epithelial / LPF 0-5 0 - 5   WBC Clumps PRESENT    Mucus PRESENT   Troponin I (High Sensitivity)  Result Value Ref Range   Troponin I (High Sensitivity) 37 (H) <18 ng/L  Troponin I (High Sensitivity)  Result Value Ref Range   Troponin I (High Sensitivity) 31 (H)  <18 ng/L       Assessment & Plan:     ICD-10-CM   1. Need for assistance due to unsteady gait  R26.89 Ambulatory referral to [place]   cannot transfer w/o assist, is able to walk on flat surfaces with rolling walker only short distance    2. At risk for falls  Z91.81 Ambulatory referral to Home Health   high risk, abnormal coordination and decreased strength    3. Fall, subsequent encounter  W19.XXXD Ambulatory referral to [place]   fall and ER visit    4. Closed compression fracture of body of L1 vertebra ([place])  S32.010A Ambulatory referral to Home Health    5. History of UTI  Z87.440 Urinalysis, Routine w reflex microscopic    Urine Culture   recheck urine     6. Chronic back pain, unspecified back location, unspecified back pain laterality  M54.9 Ambulatory referral to [place]   G89.29     7. Spinal stenosis, unspecified spinal region  M48.00 Ambulatory referral to [place]   seeing neurosurgery    8. Chronic bilateral low back pain without sciatica  M54.50 Ambulatory referral to [place]   G89.29     9. Gait difficulty  R26.9 Ambulatory referral to Home Health        Encounter was closed - and new encounter created due to pt running late - for [facility] order see other encounter documentation  Delsa Grana, PA-C 09/23/20 12:11 PM

## 2020-09-23 NOTE — Progress Notes (Signed)
Name: Taylor [name]   MRN: [id number]    DOB: 05-31-1932   Date:09/23/2020       Progress Note  Chief Complaint  Patient presents with   Referral    falls     Subjective:   Taylor [name] is a 87 y.o. female, presents to clinic for [facility] orders per neurosurgery - see other encounter documentation  Fall and ED visit on 7/18 22 Pt lives alone and has rolling walker with seat Hx of chronic pain, difficulty with gait and balance Refuses to move to facility, has life alert button She did f/up with Dr. Lacinda Axon neurosurgery on 09/14/20 they asked her to come toPCP for [facility] orders for  balance, gait and mobility  She is here alone, got a ride from a young female Difficulty with ambulating with her rolling walker Denies back pain, numbness States her legs get tired and sometimes she just can't get her legs to move like she wants them to She is terrified to fall again at home and is being very careful.   Current Outpatient Medications:    acetaminophen (TYLENOL) 500 MG tablet, Take 1 tablet (500 mg total) by mouth 3 (three) times daily. (Patient taking differently: Take 500 mg by mouth every 6 (six) hours as needed for moderate pain or headache.), Disp: 90 tablet, Rfl: 0   amLODipine (NORVASC) 10 MG tablet, TAKE 1 TABLET BY MOUTH EVERY DAY, Disp: 90 tablet, Rfl: 1   brimonidine-timolol (COMBIGAN) 0.2-0.5 % ophthalmic solution, Place 1 drop into both eyes 2 (two) times daily. , Disp: , Rfl:    Calcium Carbonate-Vitamin D (OYSTER SHELL CALCIUM 500 + D PO), Take 1 tablet by mouth 2 (two) times a day., Disp: , Rfl:    denosumab (PROLIA) 60 MG/ML SOSY injection, Inject into the skin., Disp: , Rfl:    Docusate Sodium (STOOL SOFTENER) 100 MG capsule, Take 100 mg by mouth 2 (two) times daily., Disp: , Rfl:    dorzolamide (TRUSOPT) 2 % ophthalmic solution, 1 drop 2 (two) times daily., Disp: , Rfl:    famotidine (PEPCID) 20 MG tablet, TAKE 1 TABLET BY MOUTH EVERY DAY, Disp: 90 tablet, Rfl: 1   ferrous  sulfate 325 (65 FE) MG tablet, TAKE 1 TABLET BY MOUTH EVERY DAY WITH BREAKFAST, Disp: 90 tablet, Rfl: 1   fluticasone (FLONASE) 50 MCG/ACT nasal spray, SPRAY 2 SPRAYS INTO EACH NOSTRIL EVERY DAY, Disp: 48 mL, Rfl: 1   Lancets (ONETOUCH DELICA PLUS Q000111Q) MISC, 1 each by Does not apply route daily., Disp: 100 each, Rfl: 2   metFORMIN (GLUCOPHAGE-XR) 750 MG 24 hr tablet, TAKE 2 TABLETS (1,500 MG TOTAL) BY MOUTH DAILY WITH BREAKFAST., Disp: 180 tablet, Rfl: 1   olmesartan (BENICAR) 20 MG tablet, Take 20 mg by mouth daily., Disp: , Rfl:    omega-3 acid ethyl esters (LOVAZA) 1 g capsule, TAKE 1 CAPSULE BY MOUTH 2 TIMES DAILY., Disp: 180 capsule, Rfl: 1   ONETOUCH ULTRA test strip, USE AS DIRECTED, Disp: 100 strip, Rfl: 12   [START ON 10/12/2020] oxyCODONE-acetaminophen (PERCOCET) 7.5-325 MG tablet, Take 1 tablet by mouth every 8 (eight) hours as needed for moderate pain or severe pain., Disp: 90 tablet, Rfl: 0   pravastatin (PRAVACHOL) 40 MG tablet, TAKE 1 TABLET BY MOUTH EVERYDAY AT BEDTIME, Disp: 90 tablet, Rfl: 1   ROCKLATAN 0.02-0.005 % SOLN, Apply 1 drop to eye at bedtime., Disp: , Rfl:    Vitamin D, Ergocalciferol, (DRISDOL) 1.25 MG (50000 UNIT) CAPS capsule,  Take 50,000 Units by mouth every 7 (seven) days., Disp: , Rfl:   Patient Active Problem List   Diagnosis Date Noted   Closed compression fracture of body of L1 vertebra ([place]) 09/23/2020   Gastric polyp    Polyp of colon    Morbid obesity ([place]) 04/08/2018   Age-related osteoporosis without current pathological fracture 09/26/2017   Pain in both hands 09/23/2017   Hyperparathyroidism ([place]) 08/06/2017   Chronic kidney disease, stage III (moderate) ([place]) 08/06/2017   HLD (hyperlipidemia) 07/25/2017   Osteoporotic compression fracture of spine ([place]) 03/22/2017   Assistance needed with transportation 03/21/2017   Iron deficiency anemia 12/19/2016   Aortic atherosclerosis ([place]) 11/23/2016   Anterolisthesis 11/23/2016   Chronic pain  syndrome 09/13/2016   Vitamin D deficiency 08/19/2016   Anemia, unspecified 08/19/2016   Primary open angle glaucoma (POAG) of both eyes, severe stage 08/17/2016   Hypertensive retinopathy 08/17/2016   Long term current use of opiate analgesic 07/19/2016   Dyslipidemia associated with type 2 diabetes mellitus ([place]) 04/16/2016   Chronic midline low back pain without sciatica 04/16/2016   Chronic pain of right knee 04/16/2016   Vaginal dryness 02/22/2015   Calculus of kidney 11/10/2014   Osteoarthritis, multiple sites 09/09/2014   Type 2 diabetes mellitus with microalbuminuria ([place]) 09/09/2014   Glaucoma 09/09/2014   Benign hypertension with CKD (chronic kidney disease) stage III ([place]) 09/09/2014   Chronic pain 09/09/2014   Tinea corporis 123456   Umbilical hernia [id number]   Mass of right breast     Past Surgical History:  Procedure Laterality Date   ABDOMINAL HYSTERECTOMY  1958   menorrhagia   BACK SURGERY     BREAST BIOPSY Left 01/25/2012   BREAST BIOPSY Right 04/08/2019   hear clip, [place] bx, path pending   BREAST EXCISIONAL BIOPSY     BREAST LUMPECTOMY WITH NEEDLE LOCALIZATION Right 05/12/2019   Procedure: BREAST LUMPECTOMY WITH NEEDLE LOCALIZATION;  Surgeon: Olean Ree, MD;  Location: [facility] ORS;  Service: General;  Laterality: Right;   CATARACT EXTRACTION, BILATERAL Bilateral    1st 06/07/15 and the 2nd 06/21/15   CHOLECYSTECTOMY     COLONOSCOPY  2003   [facility]   COLONOSCOPY WITH PROPOFOL N/A 07/02/2019   Procedure: COLONOSCOPY WITH PROPOFOL;  Surgeon: Virgel Manifold, MD;  Location: [facility] ENDOSCOPY;  Service: Endoscopy;  Laterality: N/A;   COLONOSCOPY WITH PROPOFOL N/A 07/16/2019   Procedure: COLONOSCOPY WITH PROPOFOL;  Surgeon: Virgel Manifold, MD;  Location: [facility] ENDOSCOPY;  Service: Endoscopy;  Laterality: N/A;   ESOPHAGOGASTRODUODENOSCOPY (EGD) WITH PROPOFOL N/A 07/02/2019   Procedure: ESOPHAGOGASTRODUODENOSCOPY (EGD) WITH PROPOFOL;  Surgeon: Virgel Manifold,  MD;  Location: [facility] ENDOSCOPY;  Service: Endoscopy;  Laterality: N/A;   EYE SURGERY     KNEE SURGERY  2009,2011,2012   twice on right and once on left   KNEE SURGERY     LIPOMA EXCISION  1998   SPINE SURGERY  2004   TONSILLECTOMY     age of 52    Family History  Problem Relation Age of Onset   Cancer Mother        skin cancer   Stroke Mother    Heart disease Father    Breast cancer Neg Hx     Social History   Tobacco Use   Smoking status: Never   Smokeless tobacco: Never  Vaping Use   Vaping Use: Never used  Substance Use Topics   Alcohol use: No    Alcohol/week: 0.0 standard drinks  Drug use: No     Allergies  Allergen Reactions   Ace Inhibitors Cough    Health Maintenance  Topic Date Due   OPHTHALMOLOGY EXAM  08/04/2019   URINE MICROALBUMIN  04/16/2020   COVID-19 Vaccine (3 - Booster for Moderna series) 10/09/2020 (Originally 05/09/2020)   INFLUENZA VACCINE  11/19/2020 (Originally 09/19/2020)   Zoster Vaccines- Shingrix (1 of 2) 12/24/2020 (Originally 02/15/1985)   TETANUS/TDAP  10/18/2020   MAMMOGRAM  10/19/2020   FOOT EXAM  12/13/2020   HEMOGLOBIN A1C  02/16/2021   DEXA SCAN  Completed   PNA vac Low Risk Adult  Completed   HPV VACCINES  Aged Out    Chart Review Today: I personally reviewed active problem list, medication list, allergies, family history, social history, health maintenance, notes from last encounter, lab results, imaging with the patient/caregiver today.   Review of Systems  Constitutional: Negative.   HENT: Negative.    Eyes: Negative.   Respiratory: Negative.    Cardiovascular: Negative.   Gastrointestinal: Negative.   Endocrine: Negative.   Genitourinary: Negative.   Musculoskeletal: Negative.   Skin: Negative.   Allergic/Immunologic: Negative.   Neurological: Negative.   Hematological: Negative.   Psychiatric/Behavioral: Negative.    All other systems reviewed and are negative.   Objective:   Vitals:   09/23/20 1432   BP: 122/64  Pulse: 91  Resp: 16  Temp: 98.2 F (36.8 C)  SpO2: 98%  Weight: 184 lb (83.5 kg)  Height: 5' (1.524 m)    Body mass index is 35.94 kg/m.  Physical Exam Vitals reviewed.  Constitutional:      General: She is not in acute distress.    Appearance: She is well-developed and well-groomed. She is obese. She is not ill-appearing, toxic-appearing or diaphoretic.  HENT:     Head: Normocephalic and atraumatic.     Right Ear: External ear normal.     Left Ear: External ear normal.     Nose: Nose normal. No congestion.     Mouth/Throat:     Mouth: Mucous membranes are moist.     Pharynx: Oropharynx is clear.  Eyes:     General: No scleral icterus.       Right eye: No discharge.        Left eye: No discharge.     Conjunctiva/sclera: Conjunctivae normal.  Cardiovascular:     Rate and Rhythm: Normal rate and regular rhythm.     Pulses: Normal pulses.     Heart sounds: Normal heart sounds.  Pulmonary:     Effort: Pulmonary effort is normal.     Breath sounds: Normal breath sounds.  Abdominal:     General: Bowel sounds are normal.     Palpations: Abdomen is soft.     Comments: Obese abd, soft, non-tender, non-distended  Neurological:     Mental Status: She is alert.     Cranial Nerves: No dysarthria or facial asymmetry.     Sensory: No sensory deficit.     Motor: Weakness present. No tremor or seizure activity.     Coordination: Coordination abnormal.     Gait: Gait abnormal.     Comments: Unable to stand independently Some dysmetria observed with attempting to ambulate with walker and step up on scale with 2 person assist Unable to do full neuro testing due to balance and gait issues 5/5 b/l dorsiflexion and plantarflexion in a seated position Grossly normal coordination of b/l UE  Psychiatric:        Attention  and Perception: Attention normal.        Mood and Affect: Mood is anxious. Affect is tearful.        Speech: Speech normal.        Behavior: Behavior is  cooperative.        Assessment & Plan:   Pt is a 87 y/o female, pt of Dr. Ancil Boozer, sent here for [facility] orders from neurosurgeon who saw pt after recent fall, ER visit with extensive imaging which was significant for T1 compression fx, Left convexity meningioma measuring 3.8 x 2.9 x 3.3 cm, Findings of chronic microvascular ischemia and generalized volume Loss, c-spine showed Multilevel degenerative disc disease and facet hypertrophy.  Pt apparently lives alone, she had someone give her a ride to get here today, she arrived with rolling walker with seat, but was not able to step up onto scale with 2-3 person assist, she had poor coordination with attempting to move feet, she was weak and could not stand on her own for only a few seconds and with hold bars, she was tearful and very upset when done with weight and sobbed afraid to fall.  [place] orders done but will discuss with her PCP that she likely needs assistance - CCM referral ordered.  Will need close f/up with PCP  Discussed with PCP - known issues and PCP states she has told her she needs 24 hour care     ICD-10-CM   1. Need for assistance due to unsteady gait  R26.89 Ambulatory referral to [place]   cannot transfer w/o assist, is able to walk on flat surfaces with rolling walker only short distance    2. At risk for falls  Z91.81 Ambulatory referral to Home Health   high risk, abnormal coordination and decreased strength    3. Fall, subsequent encounter  W19.XXXD Ambulatory referral to [place]   fall and ER visit    4. Closed compression fracture of body of L1 vertebra ([place])  S32.010A Ambulatory referral to Home Health    5. History of UTI  Z87.440 Urinalysis, Routine w reflex microscopic    Urine Culture   recheck urine     6. Chronic back pain, unspecified back location, unspecified back pain laterality  M54.9 Ambulatory referral to [place]   G89.29     7. Spinal stenosis, unspecified spinal region  M48.00 Ambulatory  referral to [place]   seeing neurosurgery    8. Chronic bilateral low back pain without sciatica  M54.50 Ambulatory referral to [place]   G89.29     9. Gait difficulty  R26.9 Ambulatory referral to [place]   1. Need for assistance due to unsteady gait  R26.89 AMB Referral to [facility]    2. At risk for falls  Z91.81 AMB Referral to [place]    3. Fall, subsequent encounter  W19.XXXD AMB Referral to [place]    4. Closed compression fracture of body of L1 vertebra ([facility])  S32.010A AMB Referral to [facility] Coordin[facility]    5. Spinal stenosis, unspecified spinal region  M48.00 AMB Referral to [place]    6. Gait difficulty  R26.9 AMB Referral to [facility]    7. History of UTI  Z87.440         Encounter was closed - and new encounter created due to pt running late - for [facility] order see other encounter documentation  1 month  f/up with PCP   Delsa Grana, PA-C 09/23/20 2:56 PM

## 2020-09-23 NOTE — Chronic Care Management (AMB) (Signed)
  Chronic Care Management   Outreach Note  09/23/2020 Name: Taylor [name] MRN: [id number] DOB: 07-Nov-1932  [name] is a 87 y.o. year old female who is a primary care patient of [name], MD. I reached out to [name] by phone today in response to a referral sent by Ms. Holli Humbles [place]'s PCP [name], MD     An unsuccessful telephone outreach was attempted today. The patient was referred to the case management team for assistance with care management and care coordination.   Follow Up Plan: A HIPAA compliant phone message was left for the patient providing contact information and requesting a return call.  If patient returns call to provider office, please advise to call Embedded Care Management Care Guide Nyair Depaulo at [place] [place] [facility]  Direct Dial: [phone]

## 2020-09-24 LAB — URINALYSIS, ROUTINE W REFLEX MICROSCOPIC
Bilirubin Urine: NEGATIVE
Glucose, UA: NEGATIVE
Hgb urine dipstick: NEGATIVE
Ketones, ur: NEGATIVE
Leukocytes,Ua: NEGATIVE
Nitrite: NEGATIVE
Protein, ur: NEGATIVE
Specific Gravity, Urine: 1.013 (ref 1.001–1.035)
pH: 5.5 (ref 5.0–8.0)

## 2020-09-24 LAB — URINE CULTURE
MICRO NUMBER:: 12208306
SPECIMEN QUALITY:: ADEQUATE

## 2020-09-26 NOTE — Chronic Care Management (AMB) (Signed)
  Chronic Care Management   Note  09/26/2020 Name: Taylor [name] MRN: [id number] DOB: 06/11/1932  [name] is a 87 y.o. year old female who is a primary care patient of [name], MD. I reached out to [name] by phone today in response to a referral sent by [name] PCP [name], MD     Ms. Level was given information about Chronic Care Management services today including:  CCM service includes personalized support from designated clinical staff supervised by her physician, including individualized plan of care and coordination with other care providers 24/7 contact phone numbers for assistance for urgent and routine care needs. Service will only be billed when office clinical staff spend 20 minutes or more in a month to coordinate care. Only one practitioner may furnish and bill the service in a calendar month. The patient may stop CCM services at any time (effective at the end of the month) by phone call to the office staff. The patient will be responsible for cost sharing (co-pay) of up to 20% of the service fee (after annual deductible is met).  Patient agreed to services and verbal consent obtained.   Follow up plan: Telephone appointment with care management team member scheduled for: 10/10/2020  Julian Hy, [place], [place] [facility]  Direct Dial: [phone]

## 2020-10-03 NOTE — Telephone Encounter (Signed)
This encounter was created in error - please disregard.

## 2020-10-04 DIAGNOSIS — D329 Benign neoplasm of meninges, unspecified: Secondary | ICD-10-CM | POA: Diagnosis not present

## 2020-10-05 DIAGNOSIS — M79674 Pain in right toe(s): Secondary | ICD-10-CM | POA: Diagnosis not present

## 2020-10-05 DIAGNOSIS — M79675 Pain in left toe(s): Secondary | ICD-10-CM | POA: Diagnosis not present

## 2020-10-05 DIAGNOSIS — B351 Tinea unguium: Secondary | ICD-10-CM | POA: Diagnosis not present

## 2020-10-07 ENCOUNTER — Other Ambulatory Visit: Payer: Self-pay | Admitting: Family Medicine

## 2020-10-07 DIAGNOSIS — Z1231 Encounter for screening mammogram for malignant neoplasm of breast: Secondary | ICD-10-CM

## 2020-10-10 ENCOUNTER — Ambulatory Visit (INDEPENDENT_AMBULATORY_CARE_PROVIDER_SITE_OTHER): Payer: Medicare Other | Admitting: *Deleted

## 2020-10-10 DIAGNOSIS — W19XXXD Unspecified fall, subsequent encounter: Secondary | ICD-10-CM

## 2020-10-10 DIAGNOSIS — E1129 Type 2 diabetes mellitus with other diabetic kidney complication: Secondary | ICD-10-CM

## 2020-10-10 DIAGNOSIS — R2689 Other abnormalities of gait and mobility: Secondary | ICD-10-CM

## 2020-10-10 DIAGNOSIS — I10 Essential (primary) hypertension: Secondary | ICD-10-CM

## 2020-10-10 DIAGNOSIS — R809 Proteinuria, unspecified: Secondary | ICD-10-CM

## 2020-10-10 DIAGNOSIS — Z9181 History of falling: Secondary | ICD-10-CM

## 2020-10-10 NOTE — Patient Instructions (Signed)
Visit Information  PATIENT GOALS:   Goals Addressed             This Visit's Progress    Find Help in My Community       Timeframe:  Long-Range Goal Priority:  Medium Start Date:    10/10/20                         Expected End Date:  05/10/20                     Follow Up Date 10/17/20    - follow-up on any referrals for help I am given - think ahead to make sure my need does not become an emergency - have a back-up plan - make a list of family or friends that I can call    Why is this important?   Knowing how and where to find help for yourself or family in your neighborhood and community is an important skill.  You will want to take some steps to learn how.    Notes:         Taylor [name] was given information about Care Management services by the embedded care coordination team including:  Care Management services include personalized support from designated clinical staff supervised by her physician, including individualized plan of care and coordination with other care providers 24/7 contact phone numbers for assistance for urgent and routine care needs. The patient may stop CCM services at any time (effective at the end of the month) by phone call to the office staff.  Patient agreed to services and verbal consent obtained.   The patient verbalized understanding of instructions, educational materials, and care plan provided today and declined offer to receive copy of patient instructions, educational materials, and care plan.   Telephone follow up appointment with care management team member scheduled for: 10/17/20  [name], [place] Worker  [facility] [phone]

## 2020-10-10 NOTE — Chronic Care Management (AMB) (Signed)
Chronic Care Management    Clinical Social Work Note  10/10/2020 Name: Taylor [name] MRN: [id number] DOB: 30-Dec-1932  Taylor [name] is a 87 y.o. year old female who is a primary care patient of [name], MD. The CCM team was consulted to assist the patient with chronic disease management and/or care coordination needs related to: [employer] .   Engaged with patient by telephone for initial visit in response to provider referral for social work chronic care management and care coordination services.   Consent to Services:  The patient was given the following information about Chronic Care Management services today, agreed to services, and gave verbal consent: 1. CCM service includes personalized support from designated clinical staff supervised by the primary care provider, including individualized plan of care and coordination with other care providers 2. 24/7 contact phone numbers for assistance for urgent and routine care needs. 3. Service will only be billed when office clinical staff spend 20 minutes or more in a month to coordinate care. 4. Only one practitioner may furnish and bill the service in a calendar month. 5.The patient may stop CCM services at any time (effective at the end of the month) by phone call to the office staff. 6. The patient will be responsible for cost sharing (co-pay) of up to 20% of the service fee (after annual deductible is met). Patient agreed to services and consent obtained.  Patient agreed to services and consent obtained.   Assessment: Review of patient past medical history, allergies, medications, and health status, including review of relevant consultants reports was performed today as part of a comprehensive evaluation and provision of chronic care management and care coordination services.     SDOH (Social Determinants of Health) assessments and interventions performed:  SDOH Interventions    Flowsheet Row Most Recent Value  SDOH  Interventions   SDOH Interventions for the Following Domains Stress  Stress Interventions --  [assistance to be provided to assist with in home care]        Advanced Directives Status: Not addressed in this encounter.  CCM Care Plan  Allergies  Allergen Reactions   Ace Inhibitors Cough    Outpatient Encounter Medications as of 10/10/2020  Medication Sig   acetaminophen (TYLENOL) 500 MG tablet Take 1 tablet (500 mg total) by mouth 3 (three) times daily. (Patient taking differently: Take 500 mg by mouth every 6 (six) hours as needed for moderate pain or headache.)   amLODipine (NORVASC) 10 MG tablet TAKE 1 TABLET BY MOUTH EVERY DAY   brimonidine-timolol (COMBIGAN) 0.2-0.5 % ophthalmic solution Place 1 drop into both eyes 2 (two) times daily.    Calcium Carbonate-Vitamin D (OYSTER SHELL CALCIUM 500 + D PO) Take 1 tablet by mouth 2 (two) times a day.   denosumab (PROLIA) 60 MG/ML SOSY injection Inject into the skin.   Docusate Sodium (STOOL SOFTENER) 100 MG capsule Take 100 mg by mouth 2 (two) times daily.   dorzolamide (TRUSOPT) 2 % ophthalmic solution 1 drop 2 (two) times daily.   famotidine (PEPCID) 20 MG tablet TAKE 1 TABLET BY MOUTH EVERY DAY   ferrous sulfate 325 (65 FE) MG tablet TAKE 1 TABLET BY MOUTH EVERY DAY WITH BREAKFAST   fluticasone (FLONASE) 50 MCG/ACT nasal spray SPRAY 2 SPRAYS INTO EACH NOSTRIL EVERY DAY   Lancets (ONETOUCH DELICA PLUS JJOACZ66A) MISC 1 each by Does not apply route daily.   metFORMIN (GLUCOPHAGE-XR) 750 MG 24 hr tablet TAKE 2 TABLETS (1,500 MG TOTAL) BY MOUTH  DAILY WITH BREAKFAST.   olmesartan (BENICAR) 20 MG tablet Take 20 mg by mouth daily.   omega-3 acid ethyl esters (LOVAZA) 1 g capsule TAKE 1 CAPSULE BY MOUTH 2 TIMES DAILY.   ONETOUCH ULTRA test strip USE AS DIRECTED   [START ON 10/12/2020] oxyCODONE-acetaminophen (PERCOCET) 7.5-325 MG tablet Take 1 tablet by mouth every 8 (eight) hours as needed for moderate pain or severe pain.   pravastatin  (PRAVACHOL) 40 MG tablet TAKE 1 TABLET BY MOUTH EVERYDAY AT BEDTIME   ROCKLATAN 0.02-0.005 % SOLN Apply 1 drop to eye at bedtime.   Vitamin D, Ergocalciferol, (DRISDOL) 1.25 MG (50000 UNIT) CAPS capsule Take 50,000 Units by mouth every 7 (seven) days.   No facility-administered encounter medications on file as of 10/10/2020.    Patient Active Problem List   Diagnosis Date Noted   Closed compression fracture of body of L1 vertebra ([place]) 09/23/2020   Gastric polyp    Polyp of colon    Morbid obesity ([place]) 04/08/2018   Age-related osteoporosis without current pathological fracture 09/26/2017   Pain in both hands 09/23/2017   Hyperparathyroidism ([place]) 08/06/2017   Chronic kidney disease, stage III (moderate) ([place]) 08/06/2017   HLD (hyperlipidemia) 07/25/2017   Osteoporotic compression fracture of spine ([place]) 03/22/2017   Assistance needed with transportation 03/21/2017   Iron deficiency anemia 12/19/2016   Aortic atherosclerosis ([place]) 11/23/2016   Anterolisthesis 11/23/2016   Chronic pain syndrome 09/13/2016   Vitamin D deficiency 08/19/2016   Anemia, unspecified 08/19/2016   Primary open angle glaucoma (POAG) of both eyes, severe stage 08/17/2016   Hypertensive retinopathy 08/17/2016   Long term current use of opiate analgesic 07/19/2016   Dyslipidemia associated with type 2 diabetes mellitus ([place]) 04/16/2016   Chronic midline low back pain without sciatica 04/16/2016   Chronic pain of right knee 04/16/2016   Vaginal dryness 02/22/2015   Calculus of kidney 11/10/2014   Osteoarthritis, multiple sites 09/09/2014   Type 2 diabetes mellitus with microalbuminuria ([place]) 09/09/2014   Glaucoma 09/09/2014   Benign hypertension with CKD (chronic kidney disease) stage III ([place]) 09/09/2014   Chronic pain 09/09/2014   Tinea corporis 29/51/8841   Umbilical hernia 66/07/3014   Mass of right breast     Conditions to be addressed/monitored: HTN; Limited social support  Care Plan : General  Social Work (Adult)  Updates made by Vern Claude, LCSW since 10/10/2020 12:00 AM     Problem: Home and Family Safety (Wellness)      Goal: Home and Family Safety Maintained   Note:   Current Barriers:  Limited social support and Limited access to caregiver Suicidal Ideation/Homicidal Ideation: No  Clinical Social Work Goal(s):  Over the next 90 days, patient will work with SW bi-weekly by telephone or in person to develop a plan for additional in home asisstance explore community resource options for unmet needs related [id number] in home care   Interventions: Patient interviewed and appropriate assessments performed: PHQ 2 PHQ 9 SDOH Interventions    Flowsheet Row Most Recent Value  SDOH Interventions   SDOH Interventions for the Following Domains Stress  Stress Interventions --  [assistance to be provided to assist with in home care]     Needs assessment completed-Patient discussed having limited support in the home to assist with her ADL's and IADL's-patient discussed difficulty bathing and bending and is often off balance Patient confirmed having a walker and cain, however discussed having limited support to assist with her daily cares (daughter lives out of the  [place], son is a Administrator) Patient confirmed having no additional services in the home, stating that she did have Meals on Wheels at one time but cancelled it because she was not able to eat (meals would pile up). Patient's support explored, patient states that she does have a family friend who will assist with groceries when she can and transportation to medical appointments but it is not consistent Alternative options explored in regards to additional in home support options, discussed private duty care(out of pocket expense, as well as follow up with her insurance company for possible options) Referral to [employer] program discussed as well. Encouraged patient to explore all options for  care Active listening / Reflection utilized ,  Emotional Support and reassurance provided Verbalization of feelings encouraged   Patient Self Care Activities:  Calls provider office for new concerns or questions  Patient Coping Strengths:  Hopefulness Self Advocate Able to Communicate Effectively  Patient Self Care Deficits:  Unable to perform ADLs independently Unable to perform IADLs independently  Initial goal documentation        Task: Identify and Reduce Risk to Safety        Follow Up Plan: SW will follow up with patient by phone over the next 7-14 business days      [place], [place] Worker  [facility] Care Management [phone]

## 2020-10-11 ENCOUNTER — Telehealth: Payer: Self-pay

## 2020-10-11 NOTE — Progress Notes (Signed)
    Chronic Care Management Pharmacy Assistant   Name: Taylor [name]  MRN: [id number] DOB: 04-Jun-1932  Patient called to be reminded of her appointment with Junius Argyle, CPP on 10/12/2020 '@1400'$  via telephone.  Patient aware of appointment date, time, and type of appointment (either telephone or in person). Patient aware to have/bring all medications, supplements, blood pressure and/or blood sugar logs to visit.  Star Rating Drug: Olmesartan 20 mg last filled on 04/02/2020 for a 90-Day supply with [facility] Metformin 750 mg last filled on 08/04/2020 for a 90-Day supply with [facility] Pravastatin 40 mg last filled on 09/01/2020 for a 90-Day supply with [facility]   Any gaps in medications fill history? Yes  Care Gaps: Ophthalmology Exam (last completed on 08/04/2018) COVID-19 Vaccine Booster [place], CPA/CMA Clinical Pharmacist Assistant Phone: [phone]

## 2020-10-12 ENCOUNTER — Ambulatory Visit: Payer: Medicare Other

## 2020-10-12 DIAGNOSIS — R809 Proteinuria, unspecified: Secondary | ICD-10-CM

## 2020-10-12 DIAGNOSIS — I152 Hypertension secondary to endocrine disorders: Secondary | ICD-10-CM

## 2020-10-12 DIAGNOSIS — E1129 Type 2 diabetes mellitus with other diabetic kidney complication: Secondary | ICD-10-CM

## 2020-10-12 NOTE — Progress Notes (Signed)
Chronic Care Management Pharmacy Note  10/12/2020 Name:  MANDEE PLUTA MRN:  [id number] DOB:  1932-08-28  Summary: Patient presents for CCM follow-up. She is still waiting to get home health. She has not filled her Olmesartan since Feb 2022, but denies tolerability concerns. She thinks she may miss her medications occassionally. She was uninterested in [employer] today.   Recommendations/Changes made from today's visit: Continue current medications. Stressed importance of adherence, utilizing pillbox Recheck DEXA  Plan: HC to check blood pressure, adherence in one month CPP follow-up 6 months  Subjective: Taylor [name] is an 87 y.o. year old female who is a primary patient of [name], MD.  The CCM team was consulted for assistance with disease management and care coordination needs.    Engaged with patient by telephone for follow up visit in response to provider referral for pharmacy case management and/or care coordination services.   Consent to Services:  The patient was given information about Chronic Care Management services, agreed to services, and gave verbal consent prior to initiation of services.  Please see initial visit note for detailed documentation.   Patient Care Team: [name], MD as PCP - General (Family Medicine) Andree Elk Alvina Filbert, MD as Consulting Physician (Anesthesiology) [name], DPM as Consulting Physician (Podiatry) [name], MD (Nephrology) Virgel Manifold, MD as Consulting Physician (Gastroenterology) Germaine Pomfret, [facility] (Pharmacist) Vern Claude, [place] as Social Worker  Recent office visits: 09/23/20: Patient presented to Delsa Grana, PA-C for follow-up.  12/14/19: Patient presented to Dr. Ancil Boozer for follow-up.   Recent consult visits: 03/09/20: Video visit with Dr. Andree Elk (Pain Management) for follow-up.   Hospital visits: 09/05/20: Patient presented to ED after fall  Objective:  Lab Results  Component  Value Date   CREATININE 1.10 (H) 09/05/2020   BUN 18 09/05/2020   GFRNONAA 49 (L) 09/05/2020   GFRAA 42 (L) 12/28/2019   NA 140 09/05/2020   K 3.6 09/05/2020   CALCIUM 8.9 09/05/2020   CO2 21 (L) 09/05/2020    Lab Results  Component Value Date/Time   HGBA1C 6.8 (A) 08/17/2020 01:22 PM   HGBA1C 6.8 (A) 04/18/2020 01:58 PM   HGBA1C 6.5 04/17/2019 02:08 PM   HGBA1C 6.6 08/13/2018 02:51 PM   HGBA1C 7.8 (A) 08/05/2017 11:30 AM   MICROALBUR 1.8 04/17/2019 03:12 PM   MICROALBUR 10.4 04/09/2018 02:43 PM   MICROALBUR 50 08/05/2017 11:28 AM   MICROALBUR 100 12/12/2015 12:21 PM    Last diabetic Eye exam:  Lab Results  Component Value Date/Time   HMDIABEYEEXA No Retinopathy 06/03/2017 12:00 AM    Last diabetic Foot exam: No results found for: HMDIABFOOTEX   Lab Results  Component Value Date   CHOL 136 12/28/2019   HDL 47 (L) 12/28/2019   LDLCALC 64 12/28/2019   TRIG 168 (H) 12/28/2019   CHOLHDL 2.9 12/28/2019    Hepatic Function Latest Ref Rng & Units 12/28/2019 04/17/2019 12/15/2018  Total Protein 6.1 - 8.1 g/dL 6.5 7.2 6.5  Albumin 3.6 - 5.1 g/dL - - -  AST 10 - 35 U/L 8(L) 11 7(L)  ALT 6 - 29 U/L 4(L) 9 5(L)  Alk Phosphatase 33 - 130 U/L - - -  Total Bilirubin 0.2 - 1.2 mg/dL 0.3 0.3 0.3    Lab Results  Component Value Date/Time   TSH 0.63 04/17/2019 03:12 PM    CBC Latest Ref Rng & Units 09/05/2020 04/17/2019 12/15/2018  WBC 4.0 - 10.5 K/uL 6.6 7.3 6.6  Hemoglobin 12.0 - 15.0 g/dL 12.4 12.1 11.4(L)  Hematocrit 36.0 - 46.0 % 36.9 37.1 35.1  Platelets 150 - 400 K/uL 297 332 371    Lab Results  Component Value Date/Time   VD25OH 29 (L) 08/05/2017 12:08 PM   VD25OH 16 (L) 08/14/2016 10:12 AM    Clinical ASCVD: Yes  The ASCVD Risk score Mikey Bussing DC Jr., et al., 2013) failed to calculate for the following reasons:   The 2013 ASCVD risk score is only valid for ages 26 to 24    Depression screen PHQ 2/9 10/10/2020 09/23/2020 08/17/2020  Decreased Interest 0 1 0  Down,  Depressed, Hopeless 0 1 0  PHQ - 2 Score 0 2 0  Altered sleeping - 0 -  Tired, decreased energy - 1 -  Change in appetite - 1 -  Feeling bad or failure about yourself  - 1 -  Trouble concentrating - 0 -  Moving slowly or fidgety/restless - 3 -  Suicidal thoughts - 0 -  PHQ-9 Score - 8 -  Difficult doing work/chores - Somewhat difficult -  Some recent data might be hidden    -Last DEXA Scan: 04/30/2017   T-Score femoral neck: -3.3  T-Score total hip: -1.9  T-Score lumbar spine: NA  T-Score forearm radius: -2.3  10-year probability of major osteoporotic fracture: NA  10-year probability of hip fracture: NA  Social History   Tobacco Use  Smoking Status Never  Smokeless Tobacco Never   BP Readings from Last 3 Encounters:  09/23/20 122/64  09/06/20 (!) 163/88  08/17/20 130/74   Pulse Readings from Last 3 Encounters:  09/23/20 91  09/06/20 88  08/17/20 73   Wt Readings from Last 3 Encounters:  09/23/20 184 lb (83.5 kg)  09/05/20 181 lb 14.1 oz (82.5 kg)  08/17/20 182 lb (82.6 kg)    Assessment/Interventions: Review of patient past medical history, allergies, medications, health status, including review of consultants reports, laboratory and other test data, was performed as part of comprehensive evaluation and provision of chronic care management services.   SDOH:  (Social Determinants of Health) assessments and interventions performed: Yes SDOH Interventions    Flowsheet Row Most Recent Value  SDOH Interventions   Financial Strain Interventions Intervention Not Indicated        CCM Care Plan  Allergies  Allergen Reactions   Ace Inhibitors Cough    Medications Reviewed Today     Reviewed by Cathrine Muster, [place] (Certified Medical Assistant) on 09/23/20 at 1453  Med List Status: <None>   Medication Order Taking? Sig Documenting Provider Last Dose Status Informant  acetaminophen (TYLENOL) 500 MG tablet [id number] Yes Take 1 tablet (500 mg total) by mouth 3  (three) times daily.  Patient taking differently: Take 500 mg by mouth every 6 (six) hours as needed for moderate pain or headache.   [name], MD Taking Active   amLODipine (NORVASC) 10 MG tablet [id number] Yes TAKE 1 TABLET BY MOUTH EVERY DAY Sowles, Drue Stager, MD Taking Active   brimonidine-timolol (COMBIGAN) 0.2-0.5 % ophthalmic solution [id number] Yes Place 1 drop into both eyes 2 (two) times daily.  [provider] Taking Active Self  Calcium Carbonate-Vitamin D (OYSTER SHELL CALCIUM 500 + D PO) [id number] Yes Take 1 tablet by mouth 2 (two) times a day. [provider] Taking Active Self  denosumab (PROLIA) 60 MG/ML SOSY injection [id number] Yes Inject into the skin. [provider] Taking Active   Docusate Sodium (STOOL SOFTENER) 100 MG capsule [id number] Yes  Take 100 mg by mouth 2 (two) times daily. [provider] Taking Active   dorzolamide (TRUSOPT) 2 % ophthalmic solution [id number] Yes 1 drop 2 (two) times daily. [provider] Taking Active   famotidine (PEPCID) 20 MG tablet [id number] Yes TAKE 1 TABLET BY MOUTH EVERY DAY [name], MD Taking Active   ferrous sulfate 325 (65 FE) MG tablet [id number] Yes TAKE 1 TABLET BY MOUTH EVERY DAY WITH [place], Drue Stager, MD Taking Active   fluticasone (FLONASE) 50 MCG/ACT nasal spray [id number] Yes SPRAY 2 SPRAYS INTO EACH NOSTRIL EVERY Eloise Harman, MD Taking Active   Lancets (ONETOUCH DELICA PLUS [id number]) [place] [id number] Yes 1 each by Does not apply route daily. [name], MD Taking Active   metFORMIN (GLUCOPHAGE-XR) 750 MG 24 hr tablet [id number] Yes TAKE 2 TABLETS (1,500 MG TOTAL) BY MOUTH DAILY WITH BREAKFAST. [name], MD Taking Active   olmesartan (BENICAR) 20 MG tablet [id number] Yes Take 20 mg by mouth daily. [provider] Taking Active   omega-3 acid ethyl esters (LOVAZA) 1 g capsule [id number] Yes TAKE 1 CAPSULE BY MOUTH 2 TIMES DAILY. [name], MD  Taking Active   [facility] ULTRA test strip [id number] Yes USE AS DIRECTED [name], MD Taking Active   oxyCODONE-acetaminophen (PERCOCET) 7.5-325 MG tablet [id number] Yes Take 1 tablet by mouth every 8 (eight) hours as needed for moderate pain or severe pain. [name], MD Taking Active   pravastatin (PRAVACHOL) 40 MG tablet [id number] Yes TAKE 1 TABLET BY MOUTH EVERYDAY AT BEDTIME [name], MD Taking Active   ROCKLATAN 0.02-0.005 % SOLN [id number] Yes Apply 1 drop to eye at bedtime. [provider] Taking Active   Vitamin D, Ergocalciferol, (DRISDOL) 1.25 MG (50000 UNIT) CAPS capsule [id number] Yes Take 50,000 Units by mouth every 7 (seven) days. [provider] Taking Active   Med List Note Landis Martins, RN 05/11/19 1432): UDS 05-11-19 MR 02/01/18            Patient Active Problem List   Diagnosis Date Noted   Closed compression fracture of body of L1 vertebra ([place]) 09/23/2020   Gastric polyp    Polyp of colon    Morbid obesity ([place]) 04/08/2018   Age-related osteoporosis without current pathological fracture 09/26/2017   Pain in both hands 09/23/2017   Hyperparathyroidism ([place]) 08/06/2017   Chronic kidney disease, stage III (moderate) ([place]) 08/06/2017   HLD (hyperlipidemia) 07/25/2017   Osteoporotic compression fracture of spine ([place]) 03/22/2017   Assistance needed with transportation 03/21/2017   Iron deficiency anemia 12/19/2016   Aortic atherosclerosis ([place]) 11/23/2016   Anterolisthesis 11/23/2016   Chronic pain syndrome 09/13/2016   Vitamin D deficiency 08/19/2016   Anemia, unspecified 08/19/2016   Primary open angle glaucoma (POAG) of both eyes, severe stage 08/17/2016   Hypertensive retinopathy 08/17/2016   Long term current use of opiate analgesic 07/19/2016   Dyslipidemia associated with type 2 diabetes mellitus ([place]) 04/16/2016   Chronic midline low back pain without sciatica 04/16/2016   Chronic pain of right knee 04/16/2016    Vaginal dryness 02/22/2015   Calculus of kidney 11/10/2014   Osteoarthritis, multiple sites 09/09/2014   Type 2 diabetes mellitus with microalbuminuria ([place]) 09/09/2014   Glaucoma 09/09/2014   Benign hypertension with CKD (chronic kidney disease) stage III ([place]) 09/09/2014   Chronic pain 09/09/2014   Tinea corporis 19/62/2297   Umbilical hernia 98/92/1194   Mass of right breast     Immunization History  Administered Date(s) Administered  Fluad Quad(high Dose 65+) 12/15/2018, 12/28/2019   Influenza, High Dose Seasonal PF 11/10/2014, 11/17/2015, 11/23/2016, 12/06/2017   Influenza-Unspecified 10/20/2012, 11/10/2014   Moderna Sars-Covid-2 Vaccination 11/12/2019, 12/10/2019   Pneumococcal Conjugate-13 05/12/2013, 08/05/2015   Tdap 10/19/2010    Conditions to be addressed/monitored:  Hypertension, Hyperlipidemia, Diabetes, Coronary Artery Disease, Chronic Kidney Disease, Osteoporosis and Osteoarthritis  Care Plan : General Pharmacy (Adult)  Updates made by Germaine Pomfret, RPH since 10/12/2020 12:00 AM     Problem: Hypertension, Hyperlipidemia, Diabetes, Coronary Artery Disease, Chronic Kidney Disease, Osteoporosis and Osteoarthritis   Priority: High     Long-Range Goal: Patient-Specific Goal   Start Date: 04/13/2020  Expected End Date: 10/12/2021  This Visit's Progress: On track  Recent Progress: On track  Priority: High  Note:   Current Barriers:  No barriers noted  Pharmacist Clinical Goal(s):  Over the next 90 days, patient will maintain control of diabetes as evidenced by A1c less than 7%  through collaboration with PharmD and provider.   Interventions: 1:1 collaboration with [name], MD regarding development and update of comprehensive plan of care as evidenced by provider attestation and co-signature Inter-disciplinary care team collaboration (see longitudinal plan of care) Comprehensive medication review performed; medication list updated in electronic  medical record  Hypertension (BP goal <140/90) -Not ideally controlled -Current treatment: Amlodipine 10 mg daily  Olmesartan 20 mg daily  -Medications previously tried: NA  -Current home readings: 983-382'N systolic  -Current dietary habits: Patient does not follow any specific dietary regimen -Current exercise habits: Patient does not follow any specific exercise regimen  -Denies hypotensive/hypertensive symptoms -Patient has not filled Olmesartan since Feb 2022. She endorses adherence to the medication, but thinks she may miss a dose "every now and then" -Recommended to continue current medication  Hyperlipidemia: (LDL goal < 70) -Controlled -Current treatment: Lovaza 1g twice daily  Pravastatin 40 mg at bedtime  -Medications previously tried: NA  -Educated on Importance of limiting foods high in cholesterol; -Recommended to continue current medication  Diabetes (A1c goal <7%) -Controlled -Current medications: Metformin XR 750 mg daily  -Medications previously tried: NA  -Current home glucose readings (checks 2-3 times weekly)  fasting glucose: 120-150s  -Denies hypoglycemic/hyperglycemic symptoms -Educated on A1c and blood sugar goals; Carbohydrate counting and/or plate method -Counseled to check feet daily and get yearly eye exams -Recommended to continue current medication  Osteoporosis(Goal Maintain bone density and prevent fractures) -Controlled -Patient is a candidate for pharmacologic treatment due to T-Score < -2.5 in femoral neck -Current treatment  Prolia 60 mg every 6 months  -Medications previously tried: NA  -Recommend weight-bearing and muscle strengthening exercises for building and maintaining bone density. -Recommended to continue current medication -Recheck DEXA  Patient Goals/Self-Care Activities Over the next 90 days, patient will:  - check glucose daily before breakfast, document, and provide at future appointments check blood pressure weekly,  document, and provide at future appointments  Follow Up Plan: Telephone follow up appointment with care management team member scheduled for:  04/19/2021 at 3:45 PM    Medication Assistance: None required.  Patient affirms current coverage meets needs.  Patient's preferred pharmacy is:  [id number] [place] [address] [id number]: [phone]: [phone] Uses pill box? No Pt endorses 75% compliance  We discussed: Current pharmacy is preferred with insurance plan and patient is satisfied with pharmacy services Patient decided to: Continue current medication management strategy  Care Plan and Follow Up Patient  Decision:  Patient agrees to Care Plan and Follow-up.  Plan: Telephone follow up appointment with care management team member scheduled for:  04/19/2021 at 3:45 PM  [facility] [phone]

## 2020-10-12 NOTE — Patient Instructions (Signed)
Visit Information It was great speaking with you today!  Please let me know if you have any questions about our visit.   Goals Addressed             This Visit's Progress    Monitor and Manage My Blood Sugar-Diabetes Type 2   On track    Timeframe:  Long-Range Goal Priority:  High Start Date: 04/13/2020                            Expected End Date: 10/12/2021                      Follow Up Date 12/18/2020   - check blood sugar once daily before breakfast   - check blood sugar if I feel it is too high or too low - enter blood sugar readings and medication or insulin into daily log    Why is this important?   Checking your blood sugar at home helps to keep it from getting very high or very low.  Writing the results in a diary or log helps the doctor know how to care for you.  Your blood sugar log should have the time, date and the results.  Also, write down the amount of insulin or other medicine that you take.  Other information, like what you ate, exercise done and how you were feeling, will also be helpful.     Notes:         Patient Care Plan: General Pharmacy (Adult)     Problem Identified: Hypertension, Hyperlipidemia, Diabetes, Coronary Artery Disease, Chronic Kidney Disease, Osteoporosis and Osteoarthritis   Priority: High     Long-Range Goal: Patient-Specific Goal   Start Date: 04/13/2020  Expected End Date: 10/12/2021  This Visit's Progress: On track  Recent Progress: On track  Priority: High  Note:   Current Barriers:  No barriers noted  Pharmacist Clinical Goal(s):  Over the next 90 days, patient will maintain control of diabetes as evidenced by A1c less than 7%  through collaboration with PharmD and provider.   Interventions: 1:1 collaboration with [name], MD regarding development and update of comprehensive plan of care as evidenced by provider attestation and co-signature Inter-disciplinary care team collaboration (see longitudinal plan of  care) Comprehensive medication review performed; medication list updated in electronic medical record  Hypertension (BP goal <140/90) -Not ideally controlled -Current treatment: Amlodipine 10 mg daily  Olmesartan 20 mg daily  -Medications previously tried: NA  -Current home readings: [id number] systolic  -Current dietary habits: Patient does not follow any specific dietary regimen -Current exercise habits: Patient does not follow any specific exercise regimen  -Denies hypotensive/hypertensive symptoms -Patient has not filled Olmesartan since Feb 2022. She endorses adherence to the medication, but thinks she may miss a dose "every now and then" -Recommended to continue current medication  Hyperlipidemia: (LDL goal < 70) -Controlled -Current treatment: Lovaza 1g twice daily  Pravastatin 40 mg at bedtime  -Medications previously tried: NA  -Educated on Importance of limiting foods high in cholesterol; -Recommended to continue current medication  Diabetes (A1c goal <7%) -Controlled -Current medications: Metformin XR 750 mg daily  -Medications previously tried: NA  -Current home glucose readings (checks 2-3 times weekly)  fasting glucose: 120-150s  -Denies hypoglycemic/hyperglycemic symptoms -Educated on A1c and blood sugar goals; Carbohydrate counting and/or plate method -Counseled to check feet daily and get yearly eye exams -Recommended to continue current medication  Osteoporosis(Goal Maintain bone density and prevent fractures) -Controlled -Patient is a candidate for pharmacologic treatment due to T-Score < -2.5 in femoral neck -Current treatment  Prolia 60 mg every 6 months  -Medications previously tried: NA  -Recommend weight-bearing and muscle strengthening exercises for building and maintaining bone density. -Recommended to continue current medication -Recheck DEXA  Patient Goals/Self-Care Activities Over the next 90 days, patient will:  - check glucose daily  before breakfast, document, and provide at future appointments check blood pressure weekly, document, and provide at future appointments  Follow Up Plan: Telephone follow up appointment with care management team member scheduled for:  04/19/2021 at 3:45 PM    Patient agreed to services and verbal consent obtained.   The patient verbalized understanding of instructions, educational materials, and care plan provided today and declined offer to receive copy of patient instructions, educational materials, and care plan.   Malva Limes, [facility] [phone]

## 2020-10-18 ENCOUNTER — Telehealth: Payer: Self-pay

## 2020-10-18 NOTE — Telephone Encounter (Signed)
Copied from [place]. Topic: General - Call Back - No Documentation >> Oct 18, 2020  2:03 PM Erick Blinks wrote: Reason for CRM: =[phone]  Pt would like to speak to [facility], requesting a call back. Regarding home health care

## 2020-10-19 DIAGNOSIS — E1159 Type 2 diabetes mellitus with other circulatory complications: Secondary | ICD-10-CM

## 2020-10-19 DIAGNOSIS — I10 Essential (primary) hypertension: Secondary | ICD-10-CM | POA: Diagnosis not present

## 2020-10-19 DIAGNOSIS — R809 Proteinuria, unspecified: Secondary | ICD-10-CM | POA: Diagnosis not present

## 2020-10-19 DIAGNOSIS — I152 Hypertension secondary to endocrine disorders: Secondary | ICD-10-CM | POA: Diagnosis not present

## 2020-10-19 DIAGNOSIS — E1129 Type 2 diabetes mellitus with other diabetic kidney complication: Secondary | ICD-10-CM | POA: Diagnosis not present

## 2020-10-21 ENCOUNTER — Inpatient Hospital Stay (HOSPITAL_BASED_OUTPATIENT_CLINIC_OR_DEPARTMENT_OTHER): Payer: Medicare Other | Admitting: Anesthesiology

## 2020-10-21 ENCOUNTER — Ambulatory Visit
Admission: RE | Admit: 2020-10-21 | Discharge: 2020-10-21 | Disposition: A | Discharge status: Home / Self Care | Payer: Medicare Other | Source: Ambulatory Visit | Attending: Family Medicine | Admitting: Family Medicine

## 2020-10-21 ENCOUNTER — Other Ambulatory Visit: Payer: Self-pay

## 2020-10-21 ENCOUNTER — Encounter: Payer: Self-pay | Admitting: Anesthesiology

## 2020-10-21 DIAGNOSIS — M25561 Pain in right knee: Secondary | ICD-10-CM

## 2020-10-21 DIAGNOSIS — G894 Chronic pain syndrome: Secondary | ICD-10-CM | POA: Diagnosis not present

## 2020-10-21 DIAGNOSIS — M545 Low back pain, unspecified: Secondary | ICD-10-CM | POA: Diagnosis not present

## 2020-10-21 DIAGNOSIS — M546 Pain in thoracic spine: Secondary | ICD-10-CM

## 2020-10-21 DIAGNOSIS — M25562 Pain in left knee: Secondary | ICD-10-CM | POA: Diagnosis not present

## 2020-10-21 DIAGNOSIS — M159 Polyosteoarthritis, unspecified: Secondary | ICD-10-CM | POA: Insufficient documentation

## 2020-10-21 DIAGNOSIS — G8929 Other chronic pain: Secondary | ICD-10-CM | POA: Insufficient documentation

## 2020-10-21 DIAGNOSIS — M79642 Pain in left hand: Secondary | ICD-10-CM | POA: Diagnosis not present

## 2020-10-21 DIAGNOSIS — Z79891 Long term (current) use of opiate analgesic: Secondary | ICD-10-CM | POA: Diagnosis not present

## 2020-10-21 DIAGNOSIS — M79641 Pain in right hand: Secondary | ICD-10-CM

## 2020-10-21 DIAGNOSIS — Z1231 Encounter for screening mammogram for malignant neoplasm of breast: Secondary | ICD-10-CM | POA: Diagnosis not present

## 2020-10-21 MED ORDER — OXYCODONE-ACETAMINOPHEN 7.5-325 MG PO TABS: 1.0000 | ORAL_TABLET | Freq: Three times a day (TID) | ORAL | 0 refills | Status: DC | PRN

## 2020-10-21 MED ORDER — OXYCODONE-ACETAMINOPHEN 7.5-325 MG PO TABS: 1.0000 | ORAL_TABLET | Freq: Three times a day (TID) | ORAL | 0 refills | Status: AC | PRN

## 2020-10-21 NOTE — Progress Notes (Signed)
Virtual Visit via Telephone Note  I connected with Taylor [name] on 10/21/20 at  1:20 PM EDT by telephone and verified that I am speaking with the correct person using two identifiers.  Location: Patient: Home Provider: Pain control center   I discussed the limitations, risks, security and privacy concerns of performing an evaluation and management service by telephone and the availability of in person appointments. I also discussed with the patient that there may be a patient responsible charge related to this service. The patient expressed understanding and agreed to proceed.   History of Present Illness: I spoke with Taylor [name] via telephone as we were unable to link for the video portion of this conference.  She states that she is doing well.  Her low back pain has been stable in nature hand pain and diffuse body pain is been stable as well.  She is try to do her stretching exercises and stay active.  She is taking her medications as prescribed and these continue to give her good relief and enable her to function and sleep better at night.  No side effects are reported.  She generally gets about 50 to 75% relief with her medicines generally lasting about 6 hours or so.  Otherwise her condition is stable with no changes in upper or lower extremity strength or function.  Review of systems: General: No fevers or chills Pulmonary: No shortness of breath or dyspnea Cardiac: No angina or palpitations or lightheadedness GI: No abdominal pain or constipation Psych: No depression    Observations/Objective:   Current Outpatient Medications:    [START ON 12/10/2020] oxyCODONE-acetaminophen (PERCOCET) 7.5-325 MG tablet, Take 1 tablet by mouth every 8 (eight) hours as needed for moderate pain or severe pain., Disp: 90 tablet, Rfl: 0   acetaminophen (TYLENOL) 500 MG tablet, Take 1 tablet (500 mg total) by mouth 3 (three) times daily. (Patient taking differently: Take 500 mg by mouth every 6 (six) hours  as needed for moderate pain or headache.), Disp: 90 tablet, Rfl: 0   amLODipine (NORVASC) 10 MG tablet, TAKE 1 TABLET BY MOUTH EVERY DAY, Disp: 90 tablet, Rfl: 1   brimonidine-timolol (COMBIGAN) 0.2-0.5 % ophthalmic solution, Place 1 drop into both eyes 2 (two) times daily. , Disp: , Rfl:    Calcium Carbonate-Vitamin D (OYSTER SHELL CALCIUM 500 + D PO), Take 1 tablet by mouth 2 (two) times a day., Disp: , Rfl:    denosumab (PROLIA) 60 MG/ML SOSY injection, Inject into the skin., Disp: , Rfl:    Docusate Sodium (STOOL SOFTENER) 100 MG capsule, Take 100 mg by mouth 2 (two) times daily., Disp: , Rfl:    dorzolamide (TRUSOPT) 2 % ophthalmic solution, 1 drop 2 (two) times daily., Disp: , Rfl:    famotidine (PEPCID) 20 MG tablet, TAKE 1 TABLET BY MOUTH EVERY DAY, Disp: 90 tablet, Rfl: 1   ferrous sulfate 325 (65 FE) MG tablet, TAKE 1 TABLET BY MOUTH EVERY DAY WITH BREAKFAST, Disp: 90 tablet, Rfl: 1   fluticasone (FLONASE) 50 MCG/ACT nasal spray, SPRAY 2 SPRAYS INTO EACH NOSTRIL EVERY DAY, Disp: 48 mL, Rfl: 1   Lancets (ONETOUCH DELICA PLUS Q000111Q) MISC, 1 each by Does not apply route daily., Disp: 100 each, Rfl: 2   metFORMIN (GLUCOPHAGE-XR) 750 MG 24 hr tablet, TAKE 2 TABLETS (1,500 MG TOTAL) BY MOUTH DAILY WITH BREAKFAST., Disp: 180 tablet, Rfl: 1   olmesartan (BENICAR) 20 MG tablet, Take 20 mg by mouth daily., Disp: , Rfl:    omega-3  acid ethyl esters (LOVAZA) 1 g capsule, TAKE 1 CAPSULE BY MOUTH 2 TIMES DAILY., Disp: 180 capsule, Rfl: 1   ONETOUCH ULTRA test strip, USE AS DIRECTED, Disp: 100 strip, Rfl: 12   [START ON 11/11/2020] oxyCODONE-acetaminophen (PERCOCET) 7.5-325 MG tablet, Take 1 tablet by mouth every 8 (eight) hours as needed for moderate pain or severe pain., Disp: 90 tablet, Rfl: 0   pravastatin (PRAVACHOL) 40 MG tablet, TAKE 1 TABLET BY MOUTH EVERYDAY AT BEDTIME, Disp: 90 tablet, Rfl: 1   ROCKLATAN 0.02-0.005 % SOLN, Apply 1 drop to eye at bedtime., Disp: , Rfl:    Vitamin D,  Ergocalciferol, (DRISDOL) 1.25 MG (50000 UNIT) CAPS capsule, Take 50,000 Units by mouth every 7 (seven) days., Disp: , Rfl:   Assessment and Plan: 1. Chronic pain syndrome   2. Chronic pain of right knee   3. Pain in both hands   4. Long term prescription opiate use   5. Chronic midline low back pain without sciatica   6. Chronic pain of both knees   7. Osteoarthritis of multiple joints, unspecified osteoarthritis type   8. Acute left-sided thoracic back pain   Based on our discussion and review of the [facility] practitioner database information we will refill her medicines for the next 2 months dated for September 23 in October 23.  No other changes in her regimen will be initiated this time.  I encouraged her to continue with stretching strengthening exercises as reviewed and we will schedule her for 58-monthreturn.  Urine screen was appropriate as previously reviewed as well.  I encouraged her also to continue follow-up with her primary care physicians for baseline medical care.  Follow Up Instructions:    I discussed the assessment and treatment plan with the patient. The patient was provided an opportunity to ask questions and all were answered. The patient agreed with the plan and demonstrated an understanding of the instructions.   The patient was advised to call back or seek an in-person evaluation if the symptoms worsen or if the condition fails to improve as anticipated.  I provided 30 minutes of non-face-to-face time during this encounter.   JMolli Barrows MD

## 2020-10-25 ENCOUNTER — Other Ambulatory Visit: Payer: Self-pay | Admitting: Family Medicine

## 2020-10-25 DIAGNOSIS — E782 Mixed hyperlipidemia: Secondary | ICD-10-CM

## 2020-10-25 DIAGNOSIS — E1169 Type 2 diabetes mellitus with other specified complication: Secondary | ICD-10-CM

## 2020-10-25 DIAGNOSIS — D649 Anemia, unspecified: Secondary | ICD-10-CM

## 2020-10-25 DIAGNOSIS — R790 Abnormal level of blood mineral: Secondary | ICD-10-CM

## 2020-10-26 NOTE — Telephone Encounter (Signed)
Requested Prescriptions  Pending Prescriptions Disp Refills  . ferrous sulfate 325 (65 FE) MG tablet [Pharmacy Med Name: FERROUS SULFATE 325 MG TABLET] 90 tablet 1    Sig: TAKE 1 TABLET BY MOUTH EVERY DAY WITH BREAKFAST     Endocrinology:  Minerals - Iron Supplementation Failed - 10/25/2020  7:33 PM      Failed - Fe (serum) in normal range and within 360 days    Iron  Date Value Ref Range Status  04/17/2019 83 45 - 160 mcg/dL Final   %SAT  Date Value Ref Range Status  04/17/2019 29 16 - 45 % (calc) Final         Failed - Ferritin in normal range and within 360 days    Ferritin  Date Value Ref Range Status  04/17/2019 31 16 - 288 ng/mL Final         Passed - HGB in normal range and within 360 days    Hemoglobin  Date Value Ref Range Status  09/05/2020 12.4 12.0 - 15.0 g/dL Final         Passed - HCT in normal range and within 360 days    HCT  Date Value Ref Range Status  09/05/2020 36.9 36.0 - 46.0 % Final         Passed - RBC in normal range and within 360 days    RBC  Date Value Ref Range Status  09/05/2020 4.02 3.87 - 5.11 MIL/uL Final         Passed - Valid encounter within last 12 months    Recent Outpatient Visits          1 month ago Need for assistance due to unsteady gait   [facility] [place], Kristeen Miss, PA-C   1 month ago Need for assistance due to unsteady gait   [facility] Delsa Grana, PA-C   2 months ago Type 2 diabetes mellitus with microalbuminuria, without long-term current use of insulin [facility])   [facility] [name], MD   6 months ago Essential hypertension   [facility] [place], Drue Stager, MD   10 months ago Type 2 diabetes mellitus with microalbuminuria, without long-term current use of insulin [facility])   [facility] [name], MD      Future Appointments            In 1 month Ancil Boozer, Drue Stager, MD [facility], [place]   In 6  months  [facility], [place]           . omega-3 acid ethyl esters (LOVAZA) 1 g capsule [Pharmacy Med Name: OMEGA-3 ETHYL ESTERS 1 GM CAP] 180 capsule 1    Sig: TAKE 1 CAPSULE BY MOUTH TWICE A DAY     Endocrinology:  Nutritional Agents Passed - 10/25/2020  7:33 PM      Passed - Valid encounter within last 12 months    Recent Outpatient Visits          1 month ago Need for assistance due to unsteady gait   [facility] Delsa Grana, PA-C   1 month ago Need for assistance due to unsteady gait   [facility] Delsa Grana, PA-C   2 months ago Type 2 diabetes mellitus with microalbuminuria, without long-term current use of insulin [facility])   [facility] [name], MD   6 months ago Essential hypertension   [place],  Drue Stager, MD   10 months ago Type 2 diabetes mellitus with microalbuminuria, without long-term current use of insulin [facility])   [facility] [name], MD      Future Appointments            In 1 month Sowles, Drue Stager, MD [facility], [place]   In 6 months  [facility]           . amLODipine (NORVASC) 10 MG tablet [Pharmacy Med Name: AMLODIPINE BESYLATE 10 MG TAB] 90 tablet 1    Sig: TAKE 1 TABLET BY MOUTH EVERY DAY     Cardiovascular:  Calcium Channel Blockers Passed - 10/25/2020  7:33 PM      Passed - Last BP in normal range    BP Readings from Last 1 Encounters:  09/23/20 122/64         Passed - Valid encounter within last 6 months    Recent Outpatient Visits          1 month ago Need for assistance due to unsteady gait   [facility] [place], Kristeen Miss, PA-C   1 month ago Need for assistance due to unsteady gait   [facility] Delsa Grana, PA-C   2 months ago Type 2 diabetes mellitus with microalbuminuria, without long-term current use of insulin [facility])    [facility] [name], MD   6 months ago Essential hypertension   [facility] [place], Drue Stager, MD   10 months ago Type 2 diabetes mellitus with microalbuminuria, without long-term current use of insulin [facility])   [facility] [name], MD      Future Appointments            In 1 month Ancil Boozer, Drue Stager, MD [facility], [place]   In 6 months  [facility], [facility]

## 2020-10-28 ENCOUNTER — Ambulatory Visit (INDEPENDENT_AMBULATORY_CARE_PROVIDER_SITE_OTHER): Payer: Medicare Other | Admitting: *Deleted

## 2020-10-28 DIAGNOSIS — E1129 Type 2 diabetes mellitus with other diabetic kidney complication: Secondary | ICD-10-CM

## 2020-10-28 DIAGNOSIS — R2689 Other abnormalities of gait and mobility: Secondary | ICD-10-CM

## 2020-10-28 DIAGNOSIS — E1159 Type 2 diabetes mellitus with other circulatory complications: Secondary | ICD-10-CM

## 2020-10-28 NOTE — Patient Instructions (Signed)
Visit Information   Goals Addressed             This Visit's Progress    Find Help in My Community       Timeframe:  Long-Range Goal Priority:  Medium Start Date:    10/10/20                         Expected End Date:  05/10/20                     Follow Up Date 11/07/20    - follow-up on any referrals for help I am given for private duty care - think ahead to make sure my need does not become an emergency - have a back-up plan - make a list of family or friends that I can call    Why is this important?   Knowing how and where to find help for yourself or family in your neighborhood and community is an important skill.  You will want to take some steps to learn how.    Notes:         The patient verbalized understanding of instructions, educational materials, and care plan provided today and declined offer to receive copy of patient instructions, educational materials, and care plan.   Telephone follow up appointment with care management team member scheduled for: 11/07/20  [name], [employer] Worker  [facility] [phone]

## 2020-10-28 NOTE — Chronic Care Management (AMB) (Signed)
Chronic Care Management    Clinical Social Work Note  10/28/2020 Name: Taylor [name] MRN: [id number] DOB: 19-Dec-1932  Taylor [name] is a 87 y.o. year old female who is a primary care patient of [name], MD. The CCM team was consulted to assist the patient with chronic disease management and/or care coordination needs related to: [employer] .   Engaged with patient by telephone for follow up visit in response to provider referral for social work chronic care management and care coordination services.   Consent to Services:  The patient was given information about Chronic Care Management services, agreed to services, and gave verbal consent prior to initiation of services.  Please see initial visit note for detailed documentation.   Patient agreed to services and consent obtained.   Assessment: Review of patient past medical history, allergies, medications, and health status, including review of relevant consultants reports was performed today as part of a comprehensive evaluation and provision of chronic care management and care coordination services.     SDOH (Social Determinants of Health) assessments and interventions performed:    Advanced Directives Status: Not addressed in this encounter.  CCM Care Plan  Allergies  Allergen Reactions   Ace Inhibitors Cough    Outpatient Encounter Medications as of 10/28/2020  Medication Sig   acetaminophen (TYLENOL) 500 MG tablet Take 1 tablet (500 mg total) by mouth 3 (three) times daily. (Patient taking differently: Take 500 mg by mouth every 6 (six) hours as needed for moderate pain or headache.)   amLODipine (NORVASC) 10 MG tablet TAKE 1 TABLET BY MOUTH EVERY DAY   brimonidine-timolol (COMBIGAN) 0.2-0.5 % ophthalmic solution Place 1 drop into both eyes 2 (two) times daily.    Calcium Carbonate-Vitamin D (OYSTER SHELL CALCIUM 500 + D PO) Take 1 tablet by mouth 2 (two) times a day.   denosumab (PROLIA) 60 MG/ML SOSY injection  Inject into the skin.   Docusate Sodium (STOOL SOFTENER) 100 MG capsule Take 100 mg by mouth 2 (two) times daily.   dorzolamide (TRUSOPT) 2 % ophthalmic solution 1 drop 2 (two) times daily.   famotidine (PEPCID) 20 MG tablet TAKE 1 TABLET BY MOUTH EVERY DAY   ferrous sulfate 325 (65 FE) MG tablet TAKE 1 TABLET BY MOUTH EVERY DAY WITH BREAKFAST   fluticasone (FLONASE) 50 MCG/ACT nasal spray SPRAY 2 SPRAYS INTO EACH NOSTRIL EVERY DAY   Lancets (ONETOUCH DELICA PLUS Q000111Q) MISC 1 each by Does not apply route daily.   metFORMIN (GLUCOPHAGE-XR) 750 MG 24 hr tablet TAKE 2 TABLETS (1,500 MG TOTAL) BY MOUTH DAILY WITH BREAKFAST.   olmesartan (BENICAR) 20 MG tablet Take 20 mg by mouth daily.   omega-3 acid ethyl esters (LOVAZA) 1 g capsule TAKE 1 CAPSULE BY MOUTH TWICE A DAY   ONETOUCH ULTRA test strip USE AS DIRECTED   [START ON 11/11/2020] oxyCODONE-acetaminophen (PERCOCET) 7.5-325 MG tablet Take 1 tablet by mouth every 8 (eight) hours as needed for moderate pain or severe pain.   [START ON 12/10/2020] oxyCODONE-acetaminophen (PERCOCET) 7.5-325 MG tablet Take 1 tablet by mouth every 8 (eight) hours as needed for moderate pain or severe pain.   pravastatin (PRAVACHOL) 40 MG tablet TAKE 1 TABLET BY MOUTH EVERYDAY AT BEDTIME   ROCKLATAN 0.02-0.005 % SOLN Apply 1 drop to eye at bedtime.   Vitamin D, Ergocalciferol, (DRISDOL) 1.25 MG (50000 UNIT) CAPS capsule Take 50,000 Units by mouth every 7 (seven) days.   No facility-administered encounter medications on file as of 10/28/2020.  Patient Active Problem List   Diagnosis Date Noted   Closed compression fracture of body of L1 vertebra ([place]) 09/23/2020   Gastric polyp    Polyp of colon    Morbid obesity ([place]) 04/08/2018   Age-related osteoporosis without current pathological fracture 09/26/2017   Pain in both hands 09/23/2017   Hyperparathyroidism ([place]) 08/06/2017   Chronic kidney disease, stage III (moderate) ([place]) 08/06/2017   HLD  (hyperlipidemia) 07/25/2017   Osteoporotic compression fracture of spine ([place]) 03/22/2017   Assistance needed with transportation 03/21/2017   Iron deficiency anemia 12/19/2016   Aortic atherosclerosis ([place]) 11/23/2016   Anterolisthesis 11/23/2016   Chronic pain syndrome 09/13/2016   Vitamin D deficiency 08/19/2016   Anemia, unspecified 08/19/2016   Primary open angle glaucoma (POAG) of both eyes, severe stage 08/17/2016   Hypertensive retinopathy 08/17/2016   Long term current use of opiate analgesic 07/19/2016   Dyslipidemia associated with type 2 diabetes mellitus ([place]) 04/16/2016   Chronic midline low back pain without sciatica 04/16/2016   Chronic pain of right knee 04/16/2016   Vaginal dryness 02/22/2015   Calculus of kidney 11/10/2014   Osteoarthritis, multiple sites 09/09/2014   Type 2 diabetes mellitus with microalbuminuria ([place]) 09/09/2014   Glaucoma 09/09/2014   Benign hypertension with CKD (chronic kidney disease) stage III ([place]) 09/09/2014   Chronic pain 09/09/2014   Tinea corporis [id number]   Umbilical hernia [id number]   Mass of right breast     Conditions to be addressed/monitored: HTN; Limited social support  Care Plan : General Social Work (Adult)  Updates made by Vern Claude, LCSW since 10/28/2020 12:00 AM     Problem: Home and Family Safety (Wellness)      Goal: Home and Family Safety Maintained   This Visit's Progress: On track  Priority: Medium  Note:   Current Barriers:  Limited social support and Limited access to caregiver Suicidal Ideation/Homicidal Ideation: No  Clinical Social Work Goal(s):  Over the next 90 days, patient will work with SW bi-weekly by telephone or in person to develop a plan for additional in home asisstance explore community resource options for unmet needs related [id number] in home care   Interventions: Patient interviewed and appropriate assessments performed: PHQ 2 PHQ 9 SDOH Interventions    Flowsheet Row  Most Recent Value  SDOH Interventions   SDOH Interventions for the Following Domains Stress  Stress Interventions --  [assistance to be provided to assist with in home care]     Patient continues to discuss having limited support in the home to assist with her ADL's and IADL's-patient discussed difficulty bathing, bending -is often off balance, cannot complete household tasks In home care options discussed, including private duty care-contact numbers provided Patient confirmed having no additional services in the home and has limited resources to pay out of pocket for in home care, however will discuss options with her son. Patient to continue to have a family friend who will assist with groceries when she can and transportation to medical appointments but it is not consistent Referral to [facility] program confirmed to be pending-patient now on waiting list Encouraged patient to explore all options for care Active listening / Reflection utilized ,  Emotional Support and reassurance provided Verbalization of feelings encouraged   Patient Self Care Activities:  Calls provider office for new concerns or questions  Patient Coping Strengths:  Hopefulness Self Advocate Able to Communicate Effectively  Patient Self Care Deficits:  Unable to perform ADLs independently Unable to  perform IADLs independently  Please see past updates related to this goal by clicking on the "Past Updates" button in the selected goal           Follow Up Plan: SW will follow up with patient by phone over the next 14 business days      [place], [place] Worker  [facility] [phone]

## 2020-11-05 IMAGING — CR DG CHEST 2V
1 series · 2 of 2 positions shown · non-contrast
Comparison: Chest radiograph March 21, 2017

CLINICAL DATA: Cough and wheezing.

EXAM:
CHEST - 2 VIEW

[Series 1: dg chest 2 view · 0.14mm/px · 2 of 2 slices shown]
[im 1/2]
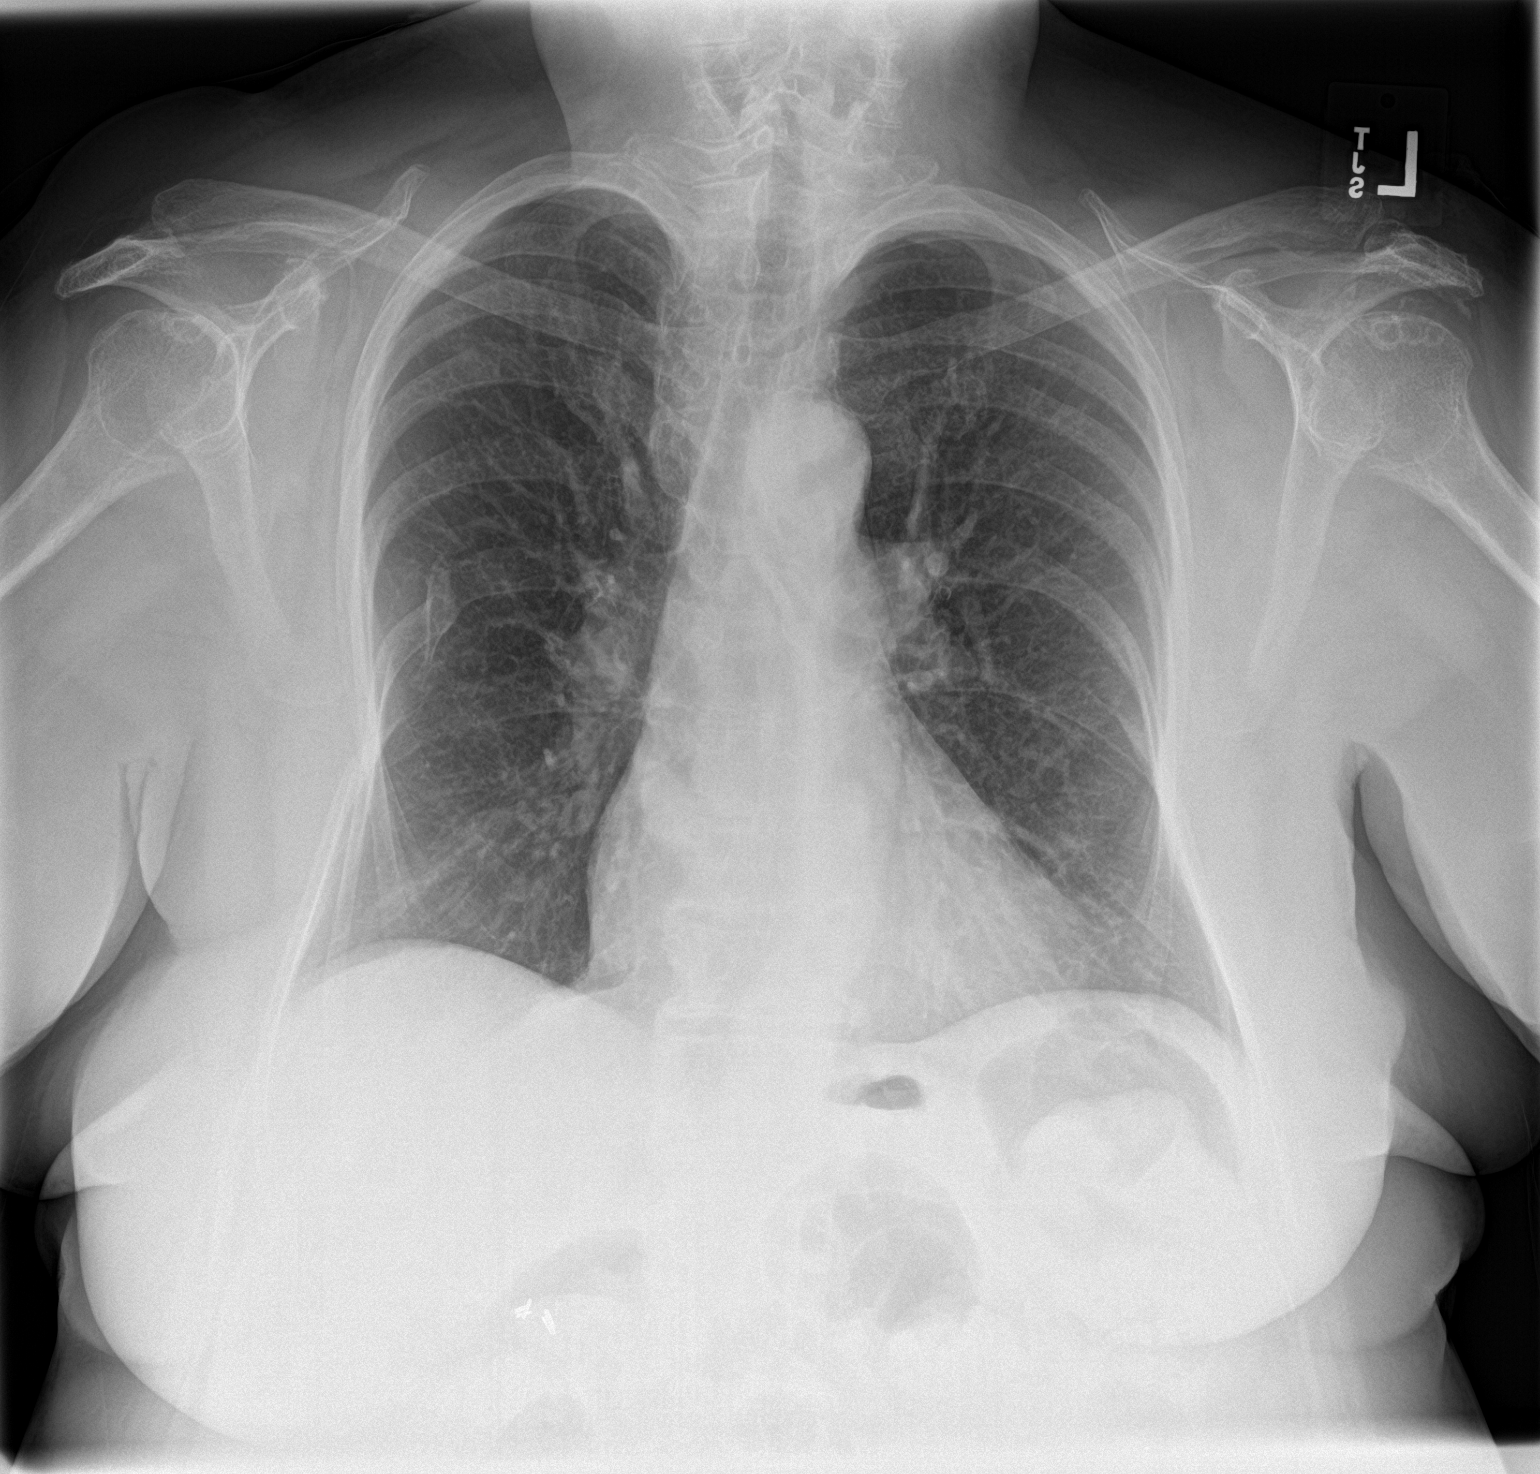
[im 2/2]
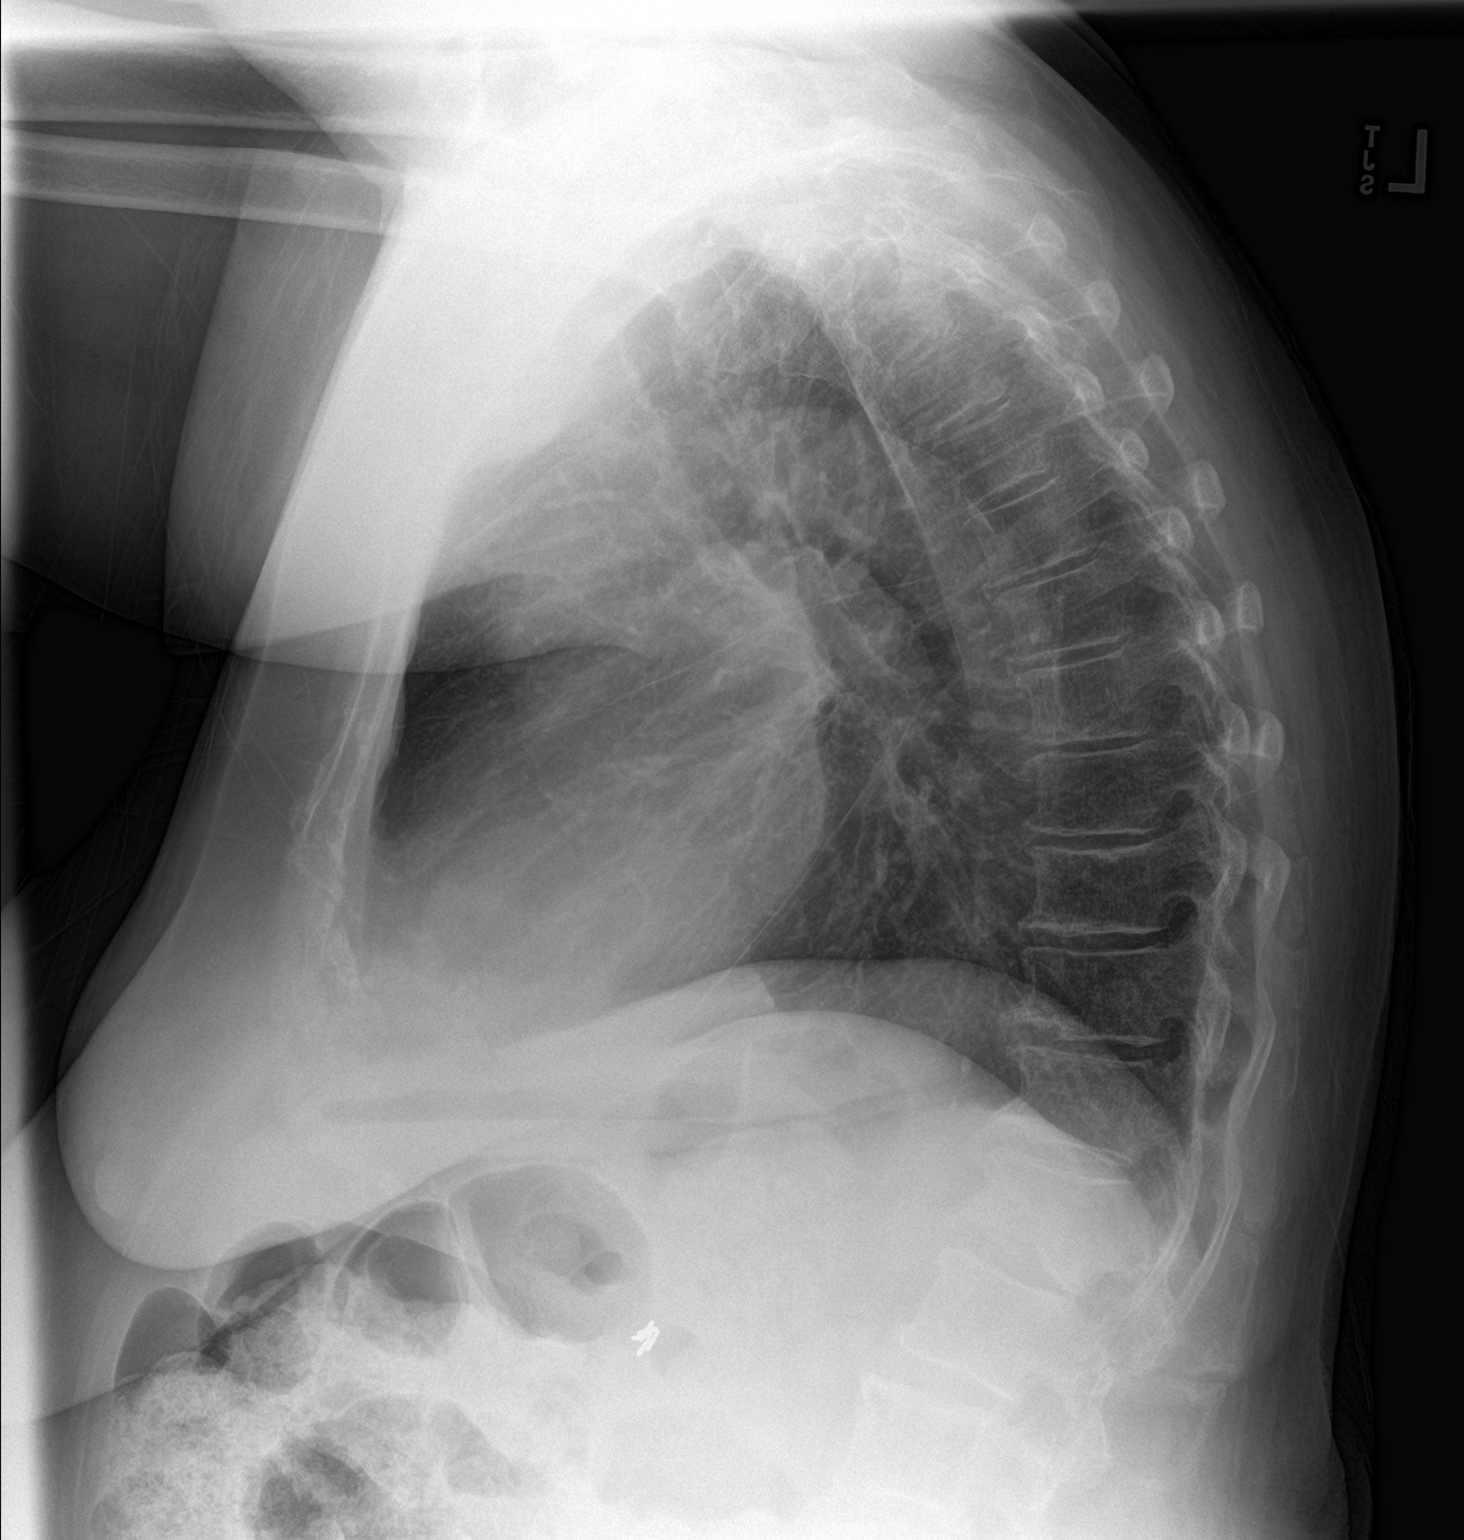

[2 of 2 positions shown; findings below may reference images not displayed]

FINDINGS: Cardiomediastinal silhouette is normal. No pleural effusions or
focal consolidations. Trachea projects midline and there is no
pneumothorax. Soft tissue planes and included osseous structures are
non-suspicious. Old RIGHT rib fracture. Surgical clips in the
included right abdomen compatible with cholecystectomy. Old mild L1
compression fracture. At least moderate volume retained large bowel
stool in the included view.
IMPRESSION: No acute cardiopulmonary process.

## 2020-11-07 ENCOUNTER — Ambulatory Visit: Payer: Medicare Other | Admitting: *Deleted

## 2020-11-07 DIAGNOSIS — E1129 Type 2 diabetes mellitus with other diabetic kidney complication: Secondary | ICD-10-CM

## 2020-11-07 DIAGNOSIS — E1159 Type 2 diabetes mellitus with other circulatory complications: Secondary | ICD-10-CM

## 2020-11-07 DIAGNOSIS — R2689 Other abnormalities of gait and mobility: Secondary | ICD-10-CM

## 2020-11-07 DIAGNOSIS — I152 Hypertension secondary to endocrine disorders: Secondary | ICD-10-CM

## 2020-11-07 NOTE — Chronic Care Management (AMB) (Signed)
Chronic Care Management    Clinical Social Work Note  11/07/2020 Name: Taylor [name] MRN: [id number] DOB: December 25, 1932  BRETTA FEES is a 87 y.o. year old female who is a primary care patient of [name], MD. The CCM team was consulted to assist the patient with chronic disease management and/or care coordination needs related to: [employer] .   Engaged with patient by telephone for follow up visit in response to provider referral for social work chronic care management and care coordination services.   Consent to Services:  The patient was given information about Chronic Care Management services, agreed to services, and gave verbal consent prior to initiation of services.  Please see initial visit note for detailed documentation.   Patient agreed to services and consent obtained.   Assessment: Review of patient past medical history, allergies, medications, and health status, including review of relevant consultants reports was performed today as part of a comprehensive evaluation and provision of chronic care management and care coordination services.     SDOH (Social Determinants of Health) assessments and interventions performed:    Advanced Directives Status: Not addressed in this encounter.  CCM Care Plan  Allergies  Allergen Reactions   Ace Inhibitors Cough    Outpatient Encounter Medications as of 11/07/2020  Medication Sig   acetaminophen (TYLENOL) 500 MG tablet Take 1 tablet (500 mg total) by mouth 3 (three) times daily. (Patient taking differently: Take 500 mg by mouth every 6 (six) hours as needed for moderate pain or headache.)   amLODipine (NORVASC) 10 MG tablet TAKE 1 TABLET BY MOUTH EVERY DAY   brimonidine-timolol (COMBIGAN) 0.2-0.5 % ophthalmic solution Place 1 drop into both eyes 2 (two) times daily.    Calcium Carbonate-Vitamin D (OYSTER SHELL CALCIUM 500 + D PO) Take 1 tablet by mouth 2 (two) times a day.   denosumab (PROLIA) 60 MG/ML SOSY  injection Inject into the skin.   Docusate Sodium (STOOL SOFTENER) 100 MG capsule Take 100 mg by mouth 2 (two) times daily.   dorzolamide (TRUSOPT) 2 % ophthalmic solution 1 drop 2 (two) times daily.   famotidine (PEPCID) 20 MG tablet TAKE 1 TABLET BY MOUTH EVERY DAY   ferrous sulfate 325 (65 FE) MG tablet TAKE 1 TABLET BY MOUTH EVERY DAY WITH BREAKFAST   fluticasone (FLONASE) 50 MCG/ACT nasal spray SPRAY 2 SPRAYS INTO EACH NOSTRIL EVERY DAY   Lancets (ONETOUCH DELICA PLUS [id number]) MISC 1 each by Does not apply route daily.   metFORMIN (GLUCOPHAGE-XR) 750 MG 24 hr tablet TAKE 2 TABLETS (1,500 MG TOTAL) BY MOUTH DAILY WITH BREAKFAST.   olmesartan (BENICAR) 20 MG tablet Take 20 mg by mouth daily.   omega-3 acid ethyl esters (LOVAZA) 1 g capsule TAKE 1 CAPSULE BY MOUTH TWICE A DAY   ONETOUCH ULTRA test strip USE AS DIRECTED   [START ON 11/11/2020] oxyCODONE-acetaminophen (PERCOCET) 7.5-325 MG tablet Take 1 tablet by mouth every 8 (eight) hours as needed for moderate pain or severe pain.   [START ON 12/10/2020] oxyCODONE-acetaminophen (PERCOCET) 7.5-325 MG tablet Take 1 tablet by mouth every 8 (eight) hours as needed for moderate pain or severe pain.   pravastatin (PRAVACHOL) 40 MG tablet TAKE 1 TABLET BY MOUTH EVERYDAY AT BEDTIME   ROCKLATAN 0.02-0.005 % SOLN Apply 1 drop to eye at bedtime.   Vitamin D, Ergocalciferol, (DRISDOL) 1.25 MG (50000 UNIT) CAPS capsule Take 50,000 Units by mouth every 7 (seven) days.   No facility-administered encounter medications on file as of 11/07/2020.  Patient Active Problem List   Diagnosis Date Noted   Closed compression fracture of body of L1 vertebra ([place]) 09/23/2020   Gastric polyp    Polyp of colon    Morbid obesity ([place]) 04/08/2018   Age-related osteoporosis without current pathological fracture 09/26/2017   Pain in both hands 09/23/2017   Hyperparathyroidism ([place]) 08/06/2017   Chronic kidney disease, stage III (moderate) ([place]) 08/06/2017   HLD  (hyperlipidemia) 07/25/2017   Osteoporotic compression fracture of spine ([place]) 03/22/2017   Assistance needed with transportation 03/21/2017   Iron deficiency anemia 12/19/2016   Aortic atherosclerosis ([place]) 11/23/2016   Anterolisthesis 11/23/2016   Chronic pain syndrome 09/13/2016   Vitamin D deficiency 08/19/2016   Anemia, unspecified 08/19/2016   Primary open angle glaucoma (POAG) of both eyes, severe stage 08/17/2016   Hypertensive retinopathy 08/17/2016   Long term current use of opiate analgesic 07/19/2016   Dyslipidemia associated with type 2 diabetes mellitus ([place]) 04/16/2016   Chronic midline low back pain without sciatica 04/16/2016   Chronic pain of right knee 04/16/2016   Vaginal dryness 02/22/2015   Calculus of kidney 11/10/2014   Osteoarthritis, multiple sites 09/09/2014   Type 2 diabetes mellitus with microalbuminuria ([place]) 09/09/2014   Glaucoma 09/09/2014   Benign hypertension with CKD (chronic kidney disease) stage III ([place]) 09/09/2014   Chronic pain 09/09/2014   Tinea corporis 40/98/1191   Umbilical hernia 47/82/9562   Mass of right breast     Conditions to be addressed/monitored: DMII; Limited social support  Care Plan : General Social Work (Adult)  Updates made by Vern Claude, LCSW since 11/07/2020 12:00 AM     Problem: Home and Family Safety (Wellness)      Goal: Home and Family Safety Maintained   This Visit's Progress: On track  Recent Progress: On track  Priority: Medium  Note:   Current Barriers:  Limited social support and Limited access to caregiver Suicidal Ideation/Homicidal Ideation: No  Clinical Social Work Goal(s):  Over the next 90 days, patient will work with SW bi-weekly by telephone or in person to develop a plan for additional in home asisstance explore community resource options for unmet needs related [id number] in home care   Interventions: SDOH Interventions    Flowsheet Row Most Recent Value  SDOH Interventions   SDOH  Interventions for the Following Domains Stress  Stress Interventions --  [assistance to be provided to assist with in home care]     Patient continues to discuss having limited support in the home to assist with her ADL's and IADL's-patient discussed difficulty bathing, bending -is often off balance, cannot complete household tasks In home care options discussed, including private duty care-patient confirmed that he has arranged for a family friend to provide care starting on 11/21/20, 3 days per week for 3 hours per day Patient to continue to have a family friend who will assist with groceries when she can and transportation to medical appointments  Referral to [facility] program confirmed-patient now on waiting list however there is no estimated wait time available-in home aid care arranged by son highly encouraged Patient encouraged to continue to utilize assistive devices in the home and to pace herself when attempting household tasks to increase safety  Emotional Support and reassurance provided Self care and safety emphasized  Patient Self Care Activities:  Calls provider office for new concerns or questions  Patient Coping Strengths:  Hopefulness Self Advocate Able to Communicate Effectively  Patient Self Care Deficits:  Unable to  perform ADLs independently Unable to perform IADLs independently  Please see past updates related to this goal by clicking on the "Past Updates" button in the selected goal           Follow Up Plan: SW will follow up with patient by phone over the next 30 business days      [place], [place] Worker  [facility] [phone]

## 2020-11-07 NOTE — Patient Instructions (Signed)
Visit Information   Goals Addressed             This Visit's Progress    Find Help in My Community       Timeframe:  Long-Range Goal Priority:  Medium Start Date:    10/10/20                         Expected End Date:  05/10/20                     Follow Up Date 11/25/20    - follow-up on any referrals for help I am given for private duty care - think ahead to make sure my need does not become an emergency - have a back-up plan - make a list of family or friends that I can call    Why is this important?   Knowing how and where to find help for yourself or family in your neighborhood and community is an important skill.  You will want to take some steps to learn how.    Notes:         The patient verbalized understanding of instructions, educational materials, and care plan provided today and declined offer to receive copy of patient instructions, educational materials, and care plan.   Telephone follow up appointment with care management team member scheduled for: 11/25/20  [name], [place] Worker  [facility] [phone]

## 2020-11-14 DIAGNOSIS — E1122 Type 2 diabetes mellitus with diabetic chronic kidney disease: Secondary | ICD-10-CM | POA: Diagnosis not present

## 2020-11-14 DIAGNOSIS — N2581 Secondary hyperparathyroidism of renal origin: Secondary | ICD-10-CM | POA: Diagnosis not present

## 2020-11-14 DIAGNOSIS — N1831 Chronic kidney disease, stage 3a: Secondary | ICD-10-CM | POA: Diagnosis not present

## 2020-11-14 DIAGNOSIS — I10 Essential (primary) hypertension: Secondary | ICD-10-CM | POA: Diagnosis not present

## 2020-11-18 DIAGNOSIS — E1159 Type 2 diabetes mellitus with other circulatory complications: Secondary | ICD-10-CM

## 2020-11-18 DIAGNOSIS — R809 Proteinuria, unspecified: Secondary | ICD-10-CM | POA: Diagnosis not present

## 2020-11-18 DIAGNOSIS — I152 Hypertension secondary to endocrine disorders: Secondary | ICD-10-CM | POA: Diagnosis not present

## 2020-11-18 DIAGNOSIS — E1129 Type 2 diabetes mellitus with other diabetic kidney complication: Secondary | ICD-10-CM | POA: Diagnosis not present

## 2020-11-25 ENCOUNTER — Ambulatory Visit (INDEPENDENT_AMBULATORY_CARE_PROVIDER_SITE_OTHER): Payer: Medicare Other | Admitting: *Deleted

## 2020-11-25 ENCOUNTER — Telehealth: Payer: Self-pay | Admitting: *Deleted

## 2020-11-25 DIAGNOSIS — R2689 Other abnormalities of gait and mobility: Secondary | ICD-10-CM

## 2020-11-25 DIAGNOSIS — E1129 Type 2 diabetes mellitus with other diabetic kidney complication: Secondary | ICD-10-CM

## 2020-11-25 DIAGNOSIS — E1159 Type 2 diabetes mellitus with other circulatory complications: Secondary | ICD-10-CM

## 2020-11-25 NOTE — Telephone Encounter (Signed)
  Care Management   Follow Up Note   11/25/2020 Name: OMELIA MARQUART MRN: [id number] DOB: Jul 22, 1932   Referred by: [name], MD Reason for referral : No chief complaint on file.   An unsuccessful telephone outreach was attempted today. The patient was referred to the case management team for assistance with care management and care coordination.   Follow Up Plan: Telephone follow up appointment with care management team member scheduled for: 11/28/20  [name], [place] Worker  [facility] [phone]

## 2020-11-27 NOTE — Patient Instructions (Signed)
Visit Information   Goals Addressed             This Visit's Progress    Find Help in My Community       Timeframe:  Long-Range Goal Priority:  Medium Start Date:    10/10/20                         Expected End Date:  11/25/20                     Follow Up Date 11/28/20    - follow-up on any referrals for help I am given for private duty care - think ahead to make sure my need does not become an emergency - have a back-up plan - make a list of family or friends that I can call  -   Why is this important?   Knowing how and where to find help for yourself or family in your neighborhood and community is an important skill.  You will want to take some steps to learn how.    Notes:         The patient verbalized understanding of instructions, educational materials, and care plan provided today and declined offer to receive copy of patient instructions, educational materials, and care plan.   Telephone follow up appointment with care management team member scheduled for:11/28/20  [name], [place] Worker  [facility] [phone]

## 2020-11-27 NOTE — Chronic Care Management (AMB) (Signed)
Chronic Care Management    Clinical Social Work Note  11/27/2020 Name: Taylor [name] MRN: [id number] DOB: [birth year]  Taylor [name] is a 87 y.o. year old female who is a primary care patient of [name], MD. The CCM team was consulted to assist the patient with chronic disease management and/or care coordination needs related to: [employer] .   Engaged with patient by telephone for follow up visit in response to provider referral for social work chronic care management and care coordination services.   Consent to Services:  The patient was given information about Chronic Care Management services, agreed to services, and gave verbal consent prior to initiation of services.  Please see initial visit note for detailed documentation.   Patient agreed to services and consent obtained.   Assessment: Review of patient past medical history, allergies, medications, and health status, including review of relevant consultants reports was performed today as part of a comprehensive evaluation and provision of chronic care management and care coordination services.     SDOH (Social Determinants of Health) assessments and interventions performed:    Advanced Directives Status: Not addressed in this encounter.  CCM Care Plan  Allergies  Allergen Reactions   Ace Inhibitors Cough    Outpatient Encounter Medications as of 11/25/2020  Medication Sig   acetaminophen (TYLENOL) 500 MG tablet Take 1 tablet (500 mg total) by mouth 3 (three) times daily. (Patient taking differently: Take 500 mg by mouth every 6 (six) hours as needed for moderate pain or headache.)   amLODipine (NORVASC) 10 MG tablet TAKE 1 TABLET BY MOUTH EVERY DAY   brimonidine-timolol (COMBIGAN) 0.2-0.5 % ophthalmic solution Place 1 drop into both eyes 2 (two) times daily.    Calcium Carbonate-Vitamin D (OYSTER SHELL CALCIUM 500 + D PO) Take 1 tablet by mouth 2 (two) times a day.   denosumab (PROLIA) 60 MG/ML SOSY  injection Inject into the skin.   Docusate Sodium (STOOL SOFTENER) 100 MG capsule Take 100 mg by mouth 2 (two) times daily.   dorzolamide (TRUSOPT) 2 % ophthalmic solution 1 drop 2 (two) times daily.   famotidine (PEPCID) 20 MG tablet TAKE 1 TABLET BY MOUTH EVERY DAY   ferrous sulfate 325 (65 FE) MG tablet TAKE 1 TABLET BY MOUTH EVERY DAY WITH BREAKFAST   fluticasone (FLONASE) 50 MCG/ACT nasal spray SPRAY 2 SPRAYS INTO EACH NOSTRIL EVERY DAY   Lancets (ONETOUCH DELICA PLUS [id number]) MISC 1 each by Does not apply route daily.   metFORMIN (GLUCOPHAGE-XR) 750 MG 24 hr tablet TAKE 2 TABLETS (1,500 MG TOTAL) BY MOUTH DAILY WITH BREAKFAST.   olmesartan (BENICAR) 20 MG tablet Take 20 mg by mouth daily.   omega-3 acid ethyl esters (LOVAZA) 1 g capsule TAKE 1 CAPSULE BY MOUTH TWICE A DAY   ONETOUCH ULTRA test strip USE AS DIRECTED   oxyCODONE-acetaminophen (PERCOCET) 7.5-325 MG tablet Take 1 tablet by mouth every 8 (eight) hours as needed for moderate pain or severe pain.   [START ON 12/10/2020] oxyCODONE-acetaminophen (PERCOCET) 7.5-325 MG tablet Take 1 tablet by mouth every 8 (eight) hours as needed for moderate pain or severe pain.   pravastatin (PRAVACHOL) 40 MG tablet TAKE 1 TABLET BY MOUTH EVERYDAY AT BEDTIME   ROCKLATAN 0.02-0.005 % SOLN Apply 1 drop to eye at bedtime.   Vitamin D, Ergocalciferol, (DRISDOL) 1.25 MG (50000 UNIT) CAPS capsule Take 50,000 Units by mouth every 7 (seven) days.   No facility-administered encounter medications on file as of 11/25/2020.  Patient Active Problem List   Diagnosis Date Noted   Closed compression fracture of body of L1 vertebra ([place]) 09/23/2020   Gastric polyp    Polyp of colon    Morbid obesity ([place]) 04/08/2018   Age-related osteoporosis without current pathological fracture 09/26/2017   Pain in both hands 09/23/2017   Hyperparathyroidism ([place]) 08/06/2017   Chronic kidney disease, stage III (moderate) ([place]) 08/06/2017   HLD (hyperlipidemia)  07/25/2017   Osteoporotic compression fracture of spine ([place]) 03/22/2017   Assistance needed with transportation 03/21/2017   Iron deficiency anemia 12/19/2016   Aortic atherosclerosis ([place]) 11/23/2016   Anterolisthesis 11/23/2016   Chronic pain syndrome 09/13/2016   Vitamin D deficiency 08/19/2016   Anemia, unspecified 08/19/2016   Primary open angle glaucoma (POAG) of both eyes, severe stage 08/17/2016   Hypertensive retinopathy 08/17/2016   Long term current use of opiate analgesic 07/19/2016   Dyslipidemia associated with type 2 diabetes mellitus ([place]) 04/16/2016   Chronic midline low back pain without sciatica 04/16/2016   Chronic pain of right knee 04/16/2016   Vaginal dryness 02/22/2015   Calculus of kidney 11/10/2014   Osteoarthritis, multiple sites 09/09/2014   Type 2 diabetes mellitus with microalbuminuria ([place]) 09/09/2014   Glaucoma 09/09/2014   Benign hypertension with CKD (chronic kidney disease) stage III ([place]) 09/09/2014   Chronic pain 09/09/2014   Tinea corporis 03/52/4818   Umbilical hernia 59/10/3110   Mass of right breast     Conditions to be addressed/monitored:  DMII; Limited social support     Follow Up Plan: SW will follow up with patient by phone over the next 7-14 business days      [name], [place] Worker  [facility] [phone]

## 2020-11-28 ENCOUNTER — Ambulatory Visit: Payer: Medicare Other | Admitting: *Deleted

## 2020-11-28 DIAGNOSIS — R809 Proteinuria, unspecified: Secondary | ICD-10-CM

## 2020-11-28 DIAGNOSIS — R2689 Other abnormalities of gait and mobility: Secondary | ICD-10-CM

## 2020-11-28 DIAGNOSIS — E1129 Type 2 diabetes mellitus with other diabetic kidney complication: Secondary | ICD-10-CM

## 2020-11-28 DIAGNOSIS — I152 Hypertension secondary to endocrine disorders: Secondary | ICD-10-CM

## 2020-11-28 DIAGNOSIS — E1159 Type 2 diabetes mellitus with other circulatory complications: Secondary | ICD-10-CM

## 2020-11-28 NOTE — Chronic Care Management (AMB) (Signed)
Chronic Care Management    Clinical Social Work Note  11/28/2020 Name: Taylor [name] MRN: [id number] DOB: 04-09-1932  Taylor [name] is a 87 y.o. year old female who is a primary care patient of [name], MD. The CCM team was consulted to assist the patient with chronic disease management and/or care coordination needs related to: [employer] .   Engaged with patient by telephone for follow up visit in response to provider referral for social work chronic care management and care coordination services.   Consent to Services:  The patient was given information about Chronic Care Management services, agreed to services, and gave verbal consent prior to initiation of services.  Please see initial visit note for detailed documentation.   Patient agreed to services and consent obtained.   Assessment: Review of patient past medical history, allergies, medications, and health status, including review of relevant consultants reports was performed today as part of a comprehensive evaluation and provision of chronic care management and care coordination services.     SDOH (Social Determinants of Health) assessments and interventions performed:    Advanced Directives Status: Not addressed in this encounter.  CCM Care Plan  Allergies  Allergen Reactions   Ace Inhibitors Cough    Outpatient Encounter Medications as of 11/28/2020  Medication Sig   acetaminophen (TYLENOL) 500 MG tablet Take 1 tablet (500 mg total) by mouth 3 (three) times daily. (Patient taking differently: Take 500 mg by mouth every 6 (six) hours as needed for moderate pain or headache.)   amLODipine (NORVASC) 10 MG tablet TAKE 1 TABLET BY MOUTH EVERY DAY   brimonidine-timolol (COMBIGAN) 0.2-0.5 % ophthalmic solution Place 1 drop into both eyes 2 (two) times daily.    Calcium Carbonate-Vitamin D (OYSTER SHELL CALCIUM 500 + D PO) Take 1 tablet by mouth 2 (two) times a day.   denosumab (PROLIA) 60 MG/ML SOSY  injection Inject into the skin.   Docusate Sodium (STOOL SOFTENER) 100 MG capsule Take 100 mg by mouth 2 (two) times daily.   dorzolamide (TRUSOPT) 2 % ophthalmic solution 1 drop 2 (two) times daily.   famotidine (PEPCID) 20 MG tablet TAKE 1 TABLET BY MOUTH EVERY DAY   ferrous sulfate 325 (65 FE) MG tablet TAKE 1 TABLET BY MOUTH EVERY DAY WITH BREAKFAST   fluticasone (FLONASE) 50 MCG/ACT nasal spray SPRAY 2 SPRAYS INTO EACH NOSTRIL EVERY DAY   Lancets (ONETOUCH DELICA PLUS [id number]) MISC 1 each by Does not apply route daily.   metFORMIN (GLUCOPHAGE-XR) 750 MG 24 hr tablet TAKE 2 TABLETS (1,500 MG TOTAL) BY MOUTH DAILY WITH BREAKFAST.   olmesartan (BENICAR) 20 MG tablet Take 20 mg by mouth daily.   omega-3 acid ethyl esters (LOVAZA) 1 g capsule TAKE 1 CAPSULE BY MOUTH TWICE A DAY   ONETOUCH ULTRA test strip USE AS DIRECTED   oxyCODONE-acetaminophen (PERCOCET) 7.5-325 MG tablet Take 1 tablet by mouth every 8 (eight) hours as needed for moderate pain or severe pain.   [START ON 12/10/2020] oxyCODONE-acetaminophen (PERCOCET) 7.5-325 MG tablet Take 1 tablet by mouth every 8 (eight) hours as needed for moderate pain or severe pain.   pravastatin (PRAVACHOL) 40 MG tablet TAKE 1 TABLET BY MOUTH EVERYDAY AT BEDTIME   ROCKLATAN 0.02-0.005 % SOLN Apply 1 drop to eye at bedtime.   Vitamin D, Ergocalciferol, (DRISDOL) 1.25 MG (50000 UNIT) CAPS capsule Take 50,000 Units by mouth every 7 (seven) days.   No facility-administered encounter medications on file as of 11/28/2020.  Patient Active Problem List   Diagnosis Date Noted   Closed compression fracture of body of L1 vertebra ([place]) 09/23/2020   Gastric polyp    Polyp of colon    Morbid obesity ([place]) 04/08/2018   Age-related osteoporosis without current pathological fracture 09/26/2017   Pain in both hands 09/23/2017   Hyperparathyroidism ([place]) 08/06/2017   Chronic kidney disease, stage III (moderate) ([place]) 08/06/2017   HLD (hyperlipidemia)  07/25/2017   Osteoporotic compression fracture of spine ([place]) 03/22/2017   Assistance needed with transportation 03/21/2017   Iron deficiency anemia 12/19/2016   Aortic atherosclerosis ([place]) 11/23/2016   Anterolisthesis 11/23/2016   Chronic pain syndrome 09/13/2016   Vitamin D deficiency 08/19/2016   Anemia, unspecified 08/19/2016   Primary open angle glaucoma (POAG) of both eyes, severe stage 08/17/2016   Hypertensive retinopathy 08/17/2016   Long term current use of opiate analgesic 07/19/2016   Dyslipidemia associated with type 2 diabetes mellitus ([place]) 04/16/2016   Chronic midline low back pain without sciatica 04/16/2016   Chronic pain of right knee 04/16/2016   Vaginal dryness 02/22/2015   Calculus of kidney 11/10/2014   Osteoarthritis, multiple sites 09/09/2014   Type 2 diabetes mellitus with microalbuminuria ([place]) 09/09/2014   Glaucoma 09/09/2014   Benign hypertension with CKD (chronic kidney disease) stage III ([place]) 09/09/2014   Chronic pain 09/09/2014   Tinea corporis 43/15/4008   Umbilical hernia 67/61/9509   Mass of right breast     Conditions to be addressed/monitored:  DMII; Limited social support  Care Plan : General Social Work (Adult)  Updates made by Vern Claude, LCSW since 11/28/2020 12:00 AM     Problem: Home and Family Safety (Wellness)      Goal: Home and Family Safety Maintained   Recent Progress: On track  Priority: Medium  Note:   Current Barriers:  Limited social support and Limited access to caregiver Suicidal Ideation/Homicidal Ideation: No  Clinical Social Work Goal(s):  Over the next 90 days, patient will work with SW bi-weekly by telephone or in person to develop a plan for additional in home asisstance explore community resource options for unmet needs related TO:IZTIWPY in home care   Interventions: SDOH Interventions    Flowsheet Row Most Recent Value  SDOH Interventions   SDOH Interventions for the Following Domains Stress   Stress Interventions --  [assistance to be provided to assist with in home care]     Patient continues to confirm that she has now arranged for a private duty aid to provide care which started  on 11/21/20, 3 days per week for 3 hours per day Patient continues to discuss concerns regarding lack of motivation to take medication regularly-lack of motivation explored-coping strategies discussed including use of reminder tools, developing a schedule and considering referral to CCM pharmacist for follow up Benefits of medication adherence discussed Patient declined referral to CCM pharmacist but will continue to consider referral Emotional Support and reassurance provided Self care and safety emphasized  Patient Self Care Activities:  Calls provider office for new concerns or questions  Patient Coping Strengths:  Hopefulness Self Advocate Able to Communicate Effectively  Patient Self Care Deficits:  Unable to perform ADLs independently Unable to perform IADLs independently  Please see past updates related to this goal by clicking on the "Past Updates" button in the selected goal           Follow Up Plan: SW will follow up with patient by phone over the next 14 business days  [name], [place] Administrator, [facility] [phone]

## 2020-11-28 NOTE — Patient Instructions (Signed)
Visit Information   Goals Addressed             This Visit's Progress    Find Help in My Community       Timeframe:  Long-Range Goal Priority:  Medium Start Date:    10/10/20                         Expected End Date:  11/25/20                     Follow Up Date 12/12/20    - follow-up on any referrals for help I am given for private duty care - think ahead to make sure my need does not become an emergency - have a back-up plan - make a list of family or friends that I can call  - medication adherence    Why is this important?   Knowing how and where to find help for yourself or family in your neighborhood and community is an important skill.  You will want to take some steps to learn how.    Notes:         The patient verbalized understanding of instructions, educational materials, and care plan provided today and declined offer to receive copy of patient instructions, educational materials, and care plan.   Telephone follow up appointment with care management team member scheduled for: 12/12/20  [name], [place] Worker  [facility] [phone]

## 2020-11-29 ENCOUNTER — Other Ambulatory Visit: Payer: Self-pay

## 2020-11-29 ENCOUNTER — Encounter: Payer: Self-pay | Admitting: Emergency Medicine

## 2020-11-29 ENCOUNTER — Emergency Department: Payer: Medicare Other

## 2020-11-29 ENCOUNTER — Emergency Department
Admission: EM | Admit: 2020-11-29 | Discharge: 2020-11-30 | Disposition: A | Discharge status: Home / Self Care | Payer: Medicare Other | Attending: Emergency Medicine | Admitting: Emergency Medicine

## 2020-11-29 DIAGNOSIS — K5641 Fecal impaction: Secondary | ICD-10-CM

## 2020-11-29 DIAGNOSIS — R000 Tachycardia, unspecified: Secondary | ICD-10-CM | POA: Diagnosis not present

## 2020-11-29 DIAGNOSIS — I129 Hypertensive chronic kidney disease with stage 1 through stage 4 chronic kidney disease, or unspecified chronic kidney disease: Secondary | ICD-10-CM | POA: Diagnosis not present

## 2020-11-29 DIAGNOSIS — I10 Essential (primary) hypertension: Secondary | ICD-10-CM | POA: Diagnosis not present

## 2020-11-29 DIAGNOSIS — Z9011 Acquired absence of right breast and nipple: Secondary | ICD-10-CM | POA: Insufficient documentation

## 2020-11-29 DIAGNOSIS — N183 Chronic kidney disease, stage 3 unspecified: Secondary | ICD-10-CM | POA: Diagnosis not present

## 2020-11-29 DIAGNOSIS — Z79899 Other long term (current) drug therapy: Secondary | ICD-10-CM | POA: Diagnosis not present

## 2020-11-29 DIAGNOSIS — E1122 Type 2 diabetes mellitus with diabetic chronic kidney disease: Secondary | ICD-10-CM | POA: Diagnosis not present

## 2020-11-29 DIAGNOSIS — K5900 Constipation, unspecified: Secondary | ICD-10-CM | POA: Insufficient documentation

## 2020-11-29 DIAGNOSIS — R531 Weakness: Secondary | ICD-10-CM | POA: Diagnosis not present

## 2020-11-29 DIAGNOSIS — N390 Urinary tract infection, site not specified: Secondary | ICD-10-CM | POA: Diagnosis not present

## 2020-11-29 DIAGNOSIS — R0902 Hypoxemia: Secondary | ICD-10-CM | POA: Diagnosis not present

## 2020-11-29 LAB — CBC WITH DIFFERENTIAL/PLATELET
Abs Immature Granulocytes: 0.06 10*3/uL (ref 0.00–0.07)
Basophils Absolute: 0 10*3/uL (ref 0.0–0.1)
Basophils Relative: 0 %
Eosinophils Absolute: 0.1 10*3/uL (ref 0.0–0.5)
Eosinophils Relative: 1 %
HCT: 38 % (ref 36.0–46.0)
Hemoglobin: 13 g/dL (ref 12.0–15.0)
Immature Granulocytes: 1 %
Lymphocytes Relative: 21 %
Lymphs Abs: 2.5 10*3/uL (ref 0.7–4.0)
MCH: 31.4 pg (ref 26.0–34.0)
MCHC: 34.2 g/dL (ref 30.0–36.0)
MCV: 91.8 fL (ref 80.0–100.0)
Monocytes Absolute: 1 10*3/uL (ref 0.1–1.0)
Monocytes Relative: 8 %
Neutro Abs: 8.2 10*3/uL — ABNORMAL HIGH (ref 1.7–7.7)
Neutrophils Relative %: 69 %
Platelets: 331 10*3/uL (ref 150–400)
RBC: 4.14 MIL/uL (ref 3.87–5.11)
RDW: 12.6 % (ref 11.5–15.5)
WBC: 11.8 10*3/uL — ABNORMAL HIGH (ref 4.0–10.5)
nRBC: 0 % (ref 0.0–0.2)

## 2020-11-29 LAB — COMPREHENSIVE METABOLIC PANEL
ALT: 7 U/L (ref 0–44)
AST: 11 U/L — ABNORMAL LOW (ref 15–41)
Albumin: 4.1 g/dL (ref 3.5–5.0)
Alkaline Phosphatase: 41 U/L (ref 38–126)
Anion gap: 9 (ref 5–15)
BUN: 26 mg/dL — ABNORMAL HIGH (ref 8–23)
CO2: 24 mmol/L (ref 22–32)
Calcium: 9.7 mg/dL (ref 8.9–10.3)
Chloride: 108 mmol/L (ref 98–111)
Creatinine, Ser: 1.26 mg/dL — ABNORMAL HIGH (ref 0.44–1.00)
GFR, Estimated: 42 mL/min — ABNORMAL LOW (ref >60)
Glucose, Bld: 134 mg/dL — ABNORMAL HIGH (ref 70–99)
Potassium: 4.3 mmol/L (ref 3.5–5.1)
Sodium: 141 mmol/L (ref 135–145)
Total Bilirubin: 0.5 mg/dL (ref 0.3–1.2)
Total Protein: 7.1 g/dL (ref 6.5–8.1)

## 2020-11-29 NOTE — ED Triage Notes (Signed)
Arrived to ed via ems from home where she lives alone. Ems reports pt has not had BM in over a week. When attempting to go pt is wiping blood. Ems reports poor PO intake. Pt A&O x 4.   164/84 HR 103 94% RA Cbg 198 Hx htn and diabetes

## 2020-11-29 NOTE — ED Triage Notes (Signed)
Pt via EMS from home. Pt states she has not had a BM in over a week. Denies pain.  Pt is A&Ox4 and NAD.

## 2020-11-30 LAB — URINALYSIS, ROUTINE W REFLEX MICROSCOPIC
Bilirubin Urine: NEGATIVE
Glucose, UA: NEGATIVE mg/dL
Ketones, ur: NEGATIVE mg/dL
Nitrite: NEGATIVE
Protein, ur: NEGATIVE mg/dL
Specific Gravity, Urine: 1.006 (ref 1.005–1.030)
pH: 8 (ref 5.0–8.0)

## 2020-11-30 MED ORDER — LACTULOSE 10 GM/15ML PO SOLN
30.0000 g | Freq: Once | ORAL | Status: AC
  Administered 2020-11-30: 30 g via ORAL
  Filled 2020-11-30: qty 60

## 2020-11-30 MED ORDER — CEPHALEXIN 250 MG PO CAPS
250.0000 mg | ORAL_CAPSULE | Freq: Once | ORAL | Status: AC
  Administered 2020-11-30: 250 mg via ORAL
  Filled 2020-11-30: qty 1

## 2020-11-30 MED ORDER — MINERAL OIL RE ENEM
1.0000 | ENEMA | Freq: Once | RECTAL | Status: AC
  Administered 2020-11-30: 1 via RECTAL

## 2020-11-30 MED ORDER — CEPHALEXIN 250 MG PO CAPS: 250.0000 mg | ORAL_CAPSULE | Freq: Three times a day (TID) | ORAL | 0 refills | Status: DC

## 2020-11-30 MED ORDER — LACTULOSE 10 GM/15ML PO SOLN: 20.0000 g | Freq: Every day | ORAL | 0 refills | Status: DC | PRN

## 2020-11-30 MED ORDER — LIDOCAINE HCL URETHRAL/MUCOSAL 2 % EX GEL
1.0000 "application " | Freq: Once | CUTANEOUS | Status: AC
  Administered 2020-11-30: 1 via TOPICAL
  Filled 2020-11-30: qty 10

## 2020-11-30 MED ORDER — DOCUSATE SODIUM 50 MG/5ML PO LIQD
100.0000 mg | Freq: Once | ORAL | Status: AC
  Administered 2020-11-30: 100 mg
  Filled 2020-11-30: qty 10

## 2020-11-30 NOTE — Discharge Instructions (Signed)
1.  Take antibiotic as prescribed (Keflex 250mg  3 times daily x7 days). 2.  You may take lactulose as needed for bowel movements. 3.  Return to the ER for worsening symptoms, persistent vomiting, difficulty breathing or other concerns.

## 2020-11-30 NOTE — ED Notes (Signed)
Enema infused, pt on left side for 15 minutes then moved to toilet. Pt unable to have BM. Urine sample collected & sent to lab. VS updated.

## 2020-11-30 NOTE — ED Notes (Signed)
Pt given modified bed bath, assisted to urinate, helped into disposable scrubs r/t previously spoiled clothing & taken to lobby in [place].

## 2020-11-30 NOTE — ED Notes (Signed)
RN at bedside with MD for manual disimpaction.

## 2020-11-30 NOTE — ED Provider Notes (Signed)
[facility] Emergency Department Provider Note   ____________________________________________   Event Date/Time   First MD Initiated Contact with Patient 11/29/20 2348     (approximate)  I have reviewed the triage vital signs and the nursing notes.   HISTORY  Chief Complaint Constipation    HPI Taylor [name] is a 87 y.o. female brought to the ED via EMS from home with a chief complaint of constipation.  Patient reports she has not had a BM in over a week, which is unusual for the patient.  She has attempted manual disimpaction at home and states she sees [facility] when she wipes because her rectum is irritated.  Has not taken anything at home.  States previously MiraLAX would help her constipation symptoms.  Endorses abdominal bloating and discomfort.  Denies fever/chills, cough, chest pain, shortness of breath, nausea, vomiting.      Past Medical History:  Diagnosis Date   Abnormal mammogram, unspecified 2013   Anemia    Arthritis    Breast screening, unspecified 2013   Diabetes mellitus without complication ([place]) 7619   non insulin dependent   Dysrhythmia    IRREGULAR HEART BEAT   GERD (gastroesophageal reflux disease)    Glaucoma 2003   Gout    History of kidney stones    H/O   Hyperlipidemia 2008   Hypertension 1980's   Lump or mass in breast 01/03/2012   left breast   Obesity, unspecified 2013   Osteoporosis    Shingles 2013   Special screening for malignant neoplasms, colon 2013    Patient Active Problem List   Diagnosis Date Noted   Closed compression fracture of body of L1 vertebra ([place]) 09/23/2020   Gastric polyp    Polyp of colon    Morbid obesity ([place]) 04/08/2018   Age-related osteoporosis without current pathological fracture 09/26/2017   Pain in both hands 09/23/2017   Hyperparathyroidism ([place]) 08/06/2017   Chronic kidney disease, stage III (moderate) ([place]) 08/06/2017   HLD (hyperlipidemia) 07/25/2017   Osteoporotic  compression fracture of spine ([place]) 03/22/2017   Assistance needed with transportation 03/21/2017   Iron deficiency anemia 12/19/2016   Aortic atherosclerosis ([place]) 11/23/2016   Anterolisthesis 11/23/2016   Chronic pain syndrome 09/13/2016   Vitamin D deficiency 08/19/2016   Anemia, unspecified 08/19/2016   Primary open angle glaucoma (POAG) of both eyes, severe stage 08/17/2016   Hypertensive retinopathy 08/17/2016   Long term current use of opiate analgesic 07/19/2016   Dyslipidemia associated with type 2 diabetes mellitus ([place]) 04/16/2016   Chronic midline low back pain without sciatica 04/16/2016   Chronic pain of right knee 04/16/2016   Vaginal dryness 02/22/2015   Calculus of kidney 11/10/2014   Osteoarthritis, multiple sites 09/09/2014   Type 2 diabetes mellitus with microalbuminuria ([place]) 09/09/2014   Glaucoma 09/09/2014   Benign hypertension with CKD (chronic kidney disease) stage III ([place]) 09/09/2014   Chronic pain 09/09/2014   Tinea corporis 50/93/2671   Umbilical hernia 24/58/0998   Mass of right breast     Past Surgical History:  Procedure Laterality Date   ABDOMINAL HYSTERECTOMY  1958   menorrhagia   BACK SURGERY     BREAST BIOPSY Left 01/25/2012   BREAST BIOPSY Right 04/08/2019   hear clip, [place] bx, path pending   BREAST EXCISIONAL BIOPSY     BREAST LUMPECTOMY WITH NEEDLE LOCALIZATION Right 05/12/2019   Procedure: BREAST LUMPECTOMY WITH NEEDLE LOCALIZATION;  Surgeon: Olean Ree, MD;  Location: [facility] ORS;  Service: General;  Laterality: Right;   CATARACT EXTRACTION, BILATERAL Bilateral    1st 06/07/15 and the 2nd 06/21/15   CHOLECYSTECTOMY     COLONOSCOPY  2003   [facility]   COLONOSCOPY WITH PROPOFOL N/A 07/02/2019   Procedure: COLONOSCOPY WITH PROPOFOL;  Surgeon: Virgel Manifold, MD;  Location: [facility] ENDOSCOPY;  Service: Endoscopy;  Laterality: N/A;   COLONOSCOPY WITH PROPOFOL N/A 07/16/2019   Procedure: COLONOSCOPY WITH PROPOFOL;  Surgeon: Virgel Manifold,  MD;  Location: [facility] ENDOSCOPY;  Service: Endoscopy;  Laterality: N/A;   ESOPHAGOGASTRODUODENOSCOPY (EGD) WITH PROPOFOL N/A 07/02/2019   Procedure: ESOPHAGOGASTRODUODENOSCOPY (EGD) WITH PROPOFOL;  Surgeon: Virgel Manifold, MD;  Location: [facility] ENDOSCOPY;  Service: Endoscopy;  Laterality: N/A;   EYE SURGERY     KNEE SURGERY  2009,2011,2012   twice on right and once on left   [place] SURGERY  2004   TONSILLECTOMY     age of 23    Prior to Admission medications   Medication Sig Start Date End Date Taking? Authorizing Provider  cephALEXin (KEFLEX) 250 MG capsule Take 1 capsule (250 mg total) by mouth 3 (three) times daily. 11/30/20  Yes Paulette Blanch, MD  lactulose (CHRONULAC) 10 GM/15ML solution Take 30 mLs (20 g total) by mouth daily as needed for mild constipation. 11/30/20  Yes Paulette Blanch, MD  acetaminophen (TYLENOL) 500 MG tablet Take 1 tablet (500 mg total) by mouth 3 (three) times daily. Patient taking differently: Take 500 mg by mouth every 6 (six) hours as needed for moderate pain or headache. 12/12/15   [name], MD  amLODipine (NORVASC) 10 MG tablet TAKE 1 TABLET BY MOUTH EVERY DAY 10/26/20   Ancil Boozer, Drue Stager, MD  brimonidine-timolol (COMBIGAN) 0.2-0.5 % ophthalmic solution Place 1 drop into both eyes 2 (two) times daily.     [provider]  Calcium Carbonate-Vitamin D (OYSTER SHELL CALCIUM 500 + D PO) Take 1 tablet by mouth 2 (two) times a day.    [provider]  denosumab (PROLIA) 60 MG/ML SOSY injection Inject into the skin. 05/25/19   [provider]  Docusate Sodium (STOOL SOFTENER) 100 MG capsule Take 100 mg by mouth 2 (two) times daily.    [provider]  dorzolamide (TRUSOPT) 2 % ophthalmic solution 1 drop 2 (two) times daily. 03/30/20   [provider]  famotidine (PEPCID) 20 MG tablet TAKE 1 TABLET BY MOUTH EVERY DAY 08/12/20   [name], MD  ferrous sulfate 325 (65 FE) MG tablet  TAKE 1 TABLET BY MOUTH EVERY DAY WITH BREAKFAST 10/26/20   Ancil Boozer, Drue Stager, MD  fluticasone [facility]) 50 MCG/ACT nasal spray SPRAY 2 SPRAYS INTO EACH NOSTRIL EVERY DAY 12/08/19   Ancil Boozer, Drue Stager, MD  Lancets [facility] DELICA PLUS [id number]) [facility] 1 each by Does not apply route daily. 12/14/19   [name], MD  metFORMIN (GLUCOPHAGE-XR) 750 MG 24 hr tablet TAKE 2 TABLETS (1,500 MG TOTAL) BY MOUTH DAILY WITH BREAKFAST. 08/12/20   Ancil Boozer, Drue Stager, MD  olmesartan (BENICAR) 20 MG tablet Take 20 mg by mouth daily.    [provider]  omega-3 acid ethyl esters (LOVAZA) 1 g capsule TAKE 1 CAPSULE BY MOUTH TWICE A DAY 10/26/20   [name], MD  [facility] ULTRA test strip USE AS DIRECTED 05/03/20   [name], MD  oxyCODONE-acetaminophen (PERCOCET) 7.5-325 MG tablet Take 1 tablet by mouth every 8 (eight) hours as needed for moderate pain or severe pain. 11/11/20  12/11/20  [name], MD  oxyCODONE-acetaminophen (PERCOCET) 7.5-325 MG tablet Take 1 tablet by mouth every 8 (eight) hours as needed for moderate pain or severe pain. 12/10/20 01/09/21  [name], MD  pravastatin (PRAVACHOL) 40 MG tablet TAKE 1 TABLET BY MOUTH EVERYDAY AT BEDTIME 07/15/20   Sowles, Drue Stager, MD  ROCKLATAN 0.02-0.005 % SOLN Apply 1 drop to eye at bedtime. 08/18/19   [provider]  Vitamin D, Ergocalciferol, (DRISDOL) 1.25 MG (50000 UNIT) CAPS capsule Take 50,000 Units by mouth every 7 (seven) days.    [provider]    Allergies Ace inhibitors  Family History  Problem Relation Age of Onset   Cancer Mother        skin cancer   Stroke Mother    Heart disease Father    Breast cancer Neg Hx     Social History Social History   Tobacco Use   Smoking status: Never   Smokeless tobacco: Never  Vaping Use   Vaping Use: Never used  Substance Use Topics   Alcohol use: No    Alcohol/week: 0.0 standard drinks   Drug use: No    Review of Systems  Constitutional: No  fever/chills Eyes: No visual changes. ENT: No sore throat. Cardiovascular: Denies chest pain. Respiratory: Denies shortness of breath. Gastrointestinal: Positive for abdominal bloating.  No nausea, no vomiting.  No diarrhea.  Positive for constipation. Genitourinary: Negative for dysuria. Musculoskeletal: Negative for back pain. Skin: Negative for rash. Neurological: Negative for headaches, focal weakness or numbness.   ____________________________________________   PHYSICAL EXAM:  VITAL SIGNS: ED Triage Vitals  Enc Vitals Group     BP 11/29/20 1837 (!) 160/76     Pulse Rate 11/29/20 1837 89     Resp 11/29/20 1837 20     Temp 11/29/20 1837 98.3 F (36.8 C)     Temp src --      SpO2 11/29/20 1837 98 %     Weight 11/29/20 1838 180 lb (81.6 kg)     Height 11/29/20 1838 5' (1.524 m)     Head Circumference --      Peak Flow --      Pain Score 11/29/20 1838 0     Pain Loc --      Pain Edu? --      Excl. in [place]? --     Constitutional: Alert and oriented.  Elderly appearing and in no acute distress. Eyes: Conjunctivae are normal. PERRL. EOMI. Head: Atraumatic. Nose: No congestion/rhinnorhea. Mouth/Throat: Mucous membranes are moist.   Neck: No stridor.   Cardiovascular: Normal rate, regular rhythm. Grossly normal heart sounds.  Good peripheral circulation. Respiratory: Normal respiratory effort.  No retractions. Lungs CTAB. Gastrointestinal: Soft and nontender to light or deep palpation.  Mild distention. No abdominal bruits. No CVA tenderness. Musculoskeletal: No lower extremity tenderness nor edema.  No joint effusions. Neurologic:  Normal speech and language. No gross focal neurologic deficits are appreciated. No gait instability. Skin:  Skin is warm, dry and intact. No rash noted. Psychiatric: Mood and affect are normal. Speech and behavior are normal.  ____________________________________________   LABS (all labs ordered are listed, but only abnormal results are  displayed)  Labs Reviewed  CBC WITH DIFFERENTIAL/PLATELET - Abnormal; Notable for the following components:      Result Value   WBC 11.8 (*)    Neutro Abs 8.2 (*)    All other components within normal limits  COMPREHENSIVE METABOLIC PANEL - Abnormal; Notable for the  following components:   Glucose, Bld 134 (*)    BUN 26 (*)    Creatinine, Ser 1.26 (*)    AST 11 (*)    GFR, Estimated 42 (*)    All other components within normal limits  URINALYSIS, ROUTINE W REFLEX MICROSCOPIC - Abnormal; Notable for the following components:   Color, Urine YELLOW (*)    APPearance HAZY (*)    Hgb urine dipstick MODERATE (*)    Leukocytes,Ua LARGE (*)    Bacteria, UA RARE (*)    All other components within normal limits  URINE CULTURE   ____________________________________________  EKG  ED ECG REPORT I, Mahlik Lenn J, the attending physician, personally viewed and interpreted this ECG.   Date: 11/30/2020  EKG Time: 2221  Rate: 108  Rhythm: sinus tachycardia  Axis: Normal  Intervals:none  ST&T Change: Nonspecific  ____________________________________________  RADIOLOGY I, Geeta Dworkin J, personally viewed and evaluated these images (plain radiographs) as part of my medical decision making, as well as reviewing the written report by the radiologist.  ED MD interpretation: Diffusely stool-filled colon  Official radiology report(s): DG Abdomen 1 View  Result Date: 11/29/2020 CLINICAL DATA:  Constipation. EXAM: ABDOMEN - 1 VIEW COMPARISON:  None. FINDINGS: Stool-filled colon. Gas within some small bowel in the mid abdomen. No small or large bowel distention. No radiopaque stones. Vascular calcifications. Postoperative fixation of the lumbosacral spine. Mild lumbar scoliosis convex towards the right. Surgical clips in the right upper quadrant. IMPRESSION: Nonobstructive bowel gas pattern with diffusely stool-filled colon compatible with the history of constipation. Electronically Signed   By:  Lucienne Capers M.D.   On: 11/29/2020 22:05    ____________________________________________   PROCEDURES  Procedure(s) performed (including Critical Care):  Procedures   ____________________________________________   INITIAL IMPRESSION / ASSESSMENT AND PLAN / ED COURSE  As part of my medical decision making, I reviewed the following data within the [place] notes reviewed and incorporated, Labs reviewed, EKG interpreted, Old chart reviewed, Radiograph reviewed, and Notes from prior ED visits     87 year old female presenting with constipation.  Differential diagnosis includes but is not limited to constipation, ileus, SBO, electrolyte abnormality, etc.  Laboratory results remarkable for mild AKI.  Will attempt mineral oil enema and reassess.  Clinical Course as of 11/30/20 5573  Wed Nov 30, 2020  0001 No success after mineral oil enema.  We will proceed with soapsuds enema.  Unfortunately there is a Psychologist, prison and probation services of magnesium citrate so that will be unavailable to place into the enema bag.  Will also administer oral lactulose. [JS]  0226 No success after soapsuds enema.  Large hand size amount of stool disimpacted.  Patient tolerated poorly.  We will let her rest and continue to observe. [JS]  [id number] Patient stayed overnight in the ED because she lives alone.  She is awake now, feeling better.  Will place on antibiotic for UTI, lactulose to use as needed for bowel movements.  Strict return precautions given.  Patient verbalizes understanding agrees with plan of care [JS]    Clinical Course User Index [JS] Paulette Blanch, MD     ____________________________________________   FINAL CLINICAL IMPRESSION(S) / ED DIAGNOSES  Final diagnoses:  Constipation, unspecified constipation type  Lower urinary tract infectious disease  Fecal impaction in rectum [facility])     ED Discharge Orders          Ordered    cephALEXin (KEFLEX) 250 MG capsule  3 times  daily  11/30/20 0559    lactulose (CHRONULAC) 10 GM/15ML solution  Daily PRN        11/30/20 0559             Note:  This document was prepared using Dragon voice recognition software and may include unintentional dictation errors.    Paulette Blanch, MD 11/30/20 0630

## 2020-12-02 ENCOUNTER — Other Ambulatory Visit: Payer: Self-pay | Admitting: Family Medicine

## 2020-12-02 DIAGNOSIS — Z9181 History of falling: Secondary | ICD-10-CM

## 2020-12-02 DIAGNOSIS — M4800 Spinal stenosis, site unspecified: Secondary | ICD-10-CM

## 2020-12-02 DIAGNOSIS — R2689 Other abnormalities of gait and mobility: Secondary | ICD-10-CM

## 2020-12-02 DIAGNOSIS — E1159 Type 2 diabetes mellitus with other circulatory complications: Secondary | ICD-10-CM

## 2020-12-02 DIAGNOSIS — M545 Low back pain, unspecified: Secondary | ICD-10-CM

## 2020-12-02 DIAGNOSIS — I152 Hypertension secondary to endocrine disorders: Secondary | ICD-10-CM

## 2020-12-02 DIAGNOSIS — G8929 Other chronic pain: Secondary | ICD-10-CM

## 2020-12-02 LAB — URINE CULTURE: Culture: >=100000 — AB

## 2020-12-06 DIAGNOSIS — M810 Age-related osteoporosis without current pathological fracture: Secondary | ICD-10-CM | POA: Diagnosis not present

## 2020-12-06 DIAGNOSIS — M79641 Pain in right hand: Secondary | ICD-10-CM | POA: Diagnosis not present

## 2020-12-06 DIAGNOSIS — K635 Polyp of colon: Secondary | ICD-10-CM | POA: Diagnosis not present

## 2020-12-06 DIAGNOSIS — K317 Polyp of stomach and duodenum: Secondary | ICD-10-CM | POA: Diagnosis not present

## 2020-12-06 DIAGNOSIS — S32010A Wedge compression fracture of first lumbar vertebra, initial encounter for closed fracture: Secondary | ICD-10-CM | POA: Diagnosis not present

## 2020-12-07 ENCOUNTER — Telehealth: Payer: Self-pay

## 2020-12-07 NOTE — Telephone Encounter (Signed)
Copied from [place] [phone]. Topic: General - Inquiry >> Dec 07, 2020 12:35 PM Oneta Rack wrote: Osvaldo Human name: Marita [place]  Relation to pt: RN from [facility]  Call back number: [phone]   Reason for call:  Start of care visit completed 12/06/2020 requesting orders for home health orders 1x 4, 1x every other week for 4 weeks. Requesting orders for social worker eval, patient does not have a care giver

## 2020-12-08 NOTE — Telephone Encounter (Signed)
Verbal orders given  

## 2020-12-09 ENCOUNTER — Telehealth: Payer: Self-pay | Admitting: Family Medicine

## 2020-12-09 DIAGNOSIS — K635 Polyp of colon: Secondary | ICD-10-CM | POA: Diagnosis not present

## 2020-12-09 DIAGNOSIS — M79641 Pain in right hand: Secondary | ICD-10-CM | POA: Diagnosis not present

## 2020-12-09 DIAGNOSIS — K317 Polyp of stomach and duodenum: Secondary | ICD-10-CM | POA: Diagnosis not present

## 2020-12-09 DIAGNOSIS — M810 Age-related osteoporosis without current pathological fracture: Secondary | ICD-10-CM | POA: Diagnosis not present

## 2020-12-09 DIAGNOSIS — S32010A Wedge compression fracture of first lumbar vertebra, initial encounter for closed fracture: Secondary | ICD-10-CM | POA: Diagnosis not present

## 2020-12-09 NOTE — Telephone Encounter (Signed)
Home Health Verbal Orders - Caller/Agency: Liji PT from [facility] Number: [phone] Requesting OT/PT/Skilled Nursing/Social Work/Speech Therapy: PT  Frequency:  1w1  2x3 1x4  Gait balance and strengthening

## 2020-12-12 ENCOUNTER — Ambulatory Visit: Payer: Medicare Other | Admitting: *Deleted

## 2020-12-12 DIAGNOSIS — E1159 Type 2 diabetes mellitus with other circulatory complications: Secondary | ICD-10-CM

## 2020-12-12 DIAGNOSIS — R2689 Other abnormalities of gait and mobility: Secondary | ICD-10-CM

## 2020-12-12 DIAGNOSIS — R809 Proteinuria, unspecified: Secondary | ICD-10-CM

## 2020-12-12 DIAGNOSIS — Z9181 History of falling: Secondary | ICD-10-CM

## 2020-12-12 DIAGNOSIS — E1129 Type 2 diabetes mellitus with other diabetic kidney complication: Secondary | ICD-10-CM

## 2020-12-12 NOTE — Telephone Encounter (Signed)
completed

## 2020-12-13 DIAGNOSIS — M79641 Pain in right hand: Secondary | ICD-10-CM | POA: Diagnosis not present

## 2020-12-13 DIAGNOSIS — S32010A Wedge compression fracture of first lumbar vertebra, initial encounter for closed fracture: Secondary | ICD-10-CM | POA: Diagnosis not present

## 2020-12-13 DIAGNOSIS — K317 Polyp of stomach and duodenum: Secondary | ICD-10-CM | POA: Diagnosis not present

## 2020-12-13 DIAGNOSIS — K635 Polyp of colon: Secondary | ICD-10-CM | POA: Diagnosis not present

## 2020-12-13 DIAGNOSIS — M810 Age-related osteoporosis without current pathological fracture: Secondary | ICD-10-CM | POA: Diagnosis not present

## 2020-12-13 NOTE — Chronic Care Management (AMB) (Signed)
Chronic Care Management    Clinical Social Work Note  12/13/2020 Name: Taylor [name] MRN: [id number] DOB: 10-Aug-1932  Taylor [name] is a 87 y.o. year old female who is a primary care patient of [name], MD. The CCM team was consulted to assist the patient with chronic disease management and/or care coordination needs related to: [employer]  and Level of Care Concerns.   Engaged with patient by telephone for follow up visit in response to provider referral for social work chronic care management and care coordination services.   Consent to Services:  The patient was given information about Chronic Care Management services, agreed to services, and gave verbal consent prior to initiation of services.  Please see initial visit note for detailed documentation.   Patient agreed to services and consent obtained.   Assessment: Review of patient past medical history, allergies, medications, and health status, including review of relevant consultants reports was performed today as part of a comprehensive evaluation and provision of chronic care management and care coordination services.     SDOH (Social Determinants of Health) assessments and interventions performed:    Advanced Directives Status: Not addressed in this encounter.  CCM Care Plan  Allergies  Allergen Reactions   Ace Inhibitors Cough    Outpatient Encounter Medications as of 12/12/2020  Medication Sig   acetaminophen (TYLENOL) 500 MG tablet Take 1 tablet (500 mg total) by mouth 3 (three) times daily. (Patient taking differently: Take 500 mg by mouth every 6 (six) hours as needed for moderate pain or headache.)   amLODipine (NORVASC) 10 MG tablet TAKE 1 TABLET BY MOUTH EVERY DAY   brimonidine-timolol (COMBIGAN) 0.2-0.5 % ophthalmic solution Place 1 drop into both eyes 2 (two) times daily.    Calcium Carbonate-Vitamin D (OYSTER SHELL CALCIUM 500 + D PO) Take 1 tablet by mouth 2 (two) times a day.   cephALEXin  (KEFLEX) 250 MG capsule Take 1 capsule (250 mg total) by mouth 3 (three) times daily.   denosumab (PROLIA) 60 MG/ML SOSY injection Inject into the skin.   Docusate Sodium (STOOL SOFTENER) 100 MG capsule Take 100 mg by mouth 2 (two) times daily.   dorzolamide (TRUSOPT) 2 % ophthalmic solution 1 drop 2 (two) times daily.   famotidine (PEPCID) 20 MG tablet TAKE 1 TABLET BY MOUTH EVERY DAY   ferrous sulfate 325 (65 FE) MG tablet TAKE 1 TABLET BY MOUTH EVERY DAY WITH BREAKFAST   fluticasone (FLONASE) 50 MCG/ACT nasal spray SPRAY 2 SPRAYS INTO EACH NOSTRIL EVERY DAY   lactulose (CHRONULAC) 10 GM/15ML solution Take 30 mLs (20 g total) by mouth daily as needed for mild constipation.   Lancets (ONETOUCH DELICA PLUS [id number]) MISC 1 each by Does not apply route daily.   metFORMIN (GLUCOPHAGE-XR) 750 MG 24 hr tablet TAKE 2 TABLETS (1,500 MG TOTAL) BY MOUTH DAILY WITH BREAKFAST.   olmesartan (BENICAR) 20 MG tablet Take 20 mg by mouth daily.   omega-3 acid ethyl esters (LOVAZA) 1 g capsule TAKE 1 CAPSULE BY MOUTH TWICE A DAY   ONETOUCH ULTRA test strip USE AS DIRECTED   oxyCODONE-acetaminophen (PERCOCET) 7.5-325 MG tablet Take 1 tablet by mouth every 8 (eight) hours as needed for moderate pain or severe pain.   pravastatin (PRAVACHOL) 40 MG tablet TAKE 1 TABLET BY MOUTH EVERYDAY AT BEDTIME   ROCKLATAN 0.02-0.005 % SOLN Apply 1 drop to eye at bedtime.   Vitamin D, Ergocalciferol, (DRISDOL) 1.25 MG (50000 UNIT) CAPS capsule Take 50,000 Units by mouth  every 7 (seven) days.   No facility-administered encounter medications on file as of 12/12/2020.    Patient Active Problem List   Diagnosis Date Noted   Closed compression fracture of body of L1 vertebra ([place]) 09/23/2020   Gastric polyp    Polyp of colon    Morbid obesity ([place]) 04/08/2018   Age-related osteoporosis without current pathological fracture 09/26/2017   Pain in both hands 09/23/2017   Hyperparathyroidism ([place]) 08/06/2017   Chronic kidney  disease, stage III (moderate) ([place]) 08/06/2017   HLD (hyperlipidemia) 07/25/2017   Osteoporotic compression fracture of spine ([place]) 03/22/2017   Assistance needed with transportation 03/21/2017   Iron deficiency anemia 12/19/2016   Aortic atherosclerosis ([place]) 11/23/2016   Anterolisthesis 11/23/2016   Chronic pain syndrome 09/13/2016   Vitamin D deficiency 08/19/2016   Anemia, unspecified 08/19/2016   Primary open angle glaucoma (POAG) of both eyes, severe stage 08/17/2016   Hypertensive retinopathy 08/17/2016   Long term current use of opiate analgesic 07/19/2016   Dyslipidemia associated with type 2 diabetes mellitus ([place]) 04/16/2016   Chronic midline low back pain without sciatica 04/16/2016   Chronic pain of right knee 04/16/2016   Vaginal dryness 02/22/2015   Calculus of kidney 11/10/2014   Osteoarthritis, multiple sites 09/09/2014   Type 2 diabetes mellitus with microalbuminuria ([place]) 09/09/2014   Glaucoma 09/09/2014   Benign hypertension with CKD (chronic kidney disease) stage III ([place]) 09/09/2014   Chronic pain 09/09/2014   Tinea corporis 35/57/3220   Umbilical hernia 25/42/7062   Mass of right breast     Conditions to be addressed/monitored: DMII; Level of care concerns  Care Plan : General Social Work (Adult)  Updates made by Vern Claude, LCSW since 12/13/2020 12:00 AM     Problem: Home and Family Safety (Wellness)      Goal: Home and Family Safety Maintained   This Visit's Progress: On track  Recent Progress: On track  Priority: Medium  Note:   Current Barriers:  Limited social support and Limited access to caregiver Suicidal Ideation/Homicidal Ideation: No  Clinical Social Work Goal(s):  Over the next 90 days, patient will work with SW bi-weekly by telephone or in person to develop a plan for additional in home asisstance explore community resource options for unmet needs related BJ:[id number] in home care   Interventions: SDOH Interventions     Flowsheet Row Most Recent Value  SDOH Interventions   SDOH Interventions for the Following Domains Stress  Stress Interventions --  [assistance to be provided to assist with in home care]     Patient continues to confirm that she has now arranged for a private duty aid to provide care which started  on 11/21/20, 3 days per week for 3 hours per day-assistance going well Confirmed referral for [facility] services-RN has completed initial intake-PT/OT start date pending Patient continues to discuss concerns regarding lack of motivation to take medication regularly-lack of motivation explored-coping strategies discussed including use of reminder tools(when aid comes in, she reminds patient) developing a schedule and considering referral to CCM pharmacist for follow up Benefits of medication adherence discussed Patient continues to decline referral to CCM pharmacist but will continue to consider referral Supportive counseling provided regarding medication adherance Emotional Support and reassurance provided Self care and safety emphasized  Patient Self Care Activities:  Calls provider office for new concerns or questions  Patient Coping Strengths:  Hopefulness Self Advocate Able to Communicate Effectively  Patient Self Care Deficits:  Unable to perform ADLs independently Unable to  perform IADLs independently  Please see past updates related to this goal by clicking on the "Past Updates" button in the selected goal           Follow Up Plan: SW will follow up with patient by phone over the next 14 business days      [place], [place] Worker  [facility] Care Management [phone]

## 2020-12-13 NOTE — Patient Instructions (Signed)
Visit Information   Goals Addressed             This Visit's Progress    Find Help in My Community       Timeframe:  Long-Range Goal Priority:  Medium Start Date:    10/10/20                         Expected End Date:  124/22                     Follow Up Date 12/26/20   - follow-up on any referrals for help I am given for private duty care - think ahead to make sure my need does not become an emergency - have a back-up plan - make a list of family or friends that I can call  - medication adherence    Why is this important?   Knowing how and where to find help for yourself or family in your neighborhood and community is an important skill.  You will want to take some steps to learn how.    Notes:         The patient verbalized understanding of instructions, educational materials, and care plan provided today and declined offer to receive copy of patient instructions, educational materials, and care plan.   Telephone follow up appointment with care management team member scheduled for: 12/26/20  [name], [place] Worker  [facility] [phone]

## 2020-12-14 ENCOUNTER — Ambulatory Visit: Payer: Self-pay | Admitting: *Deleted

## 2020-12-14 ENCOUNTER — Telehealth: Payer: Self-pay | Admitting: Family Medicine

## 2020-12-14 DIAGNOSIS — S32010A Wedge compression fracture of first lumbar vertebra, initial encounter for closed fracture: Secondary | ICD-10-CM | POA: Diagnosis not present

## 2020-12-14 DIAGNOSIS — M810 Age-related osteoporosis without current pathological fracture: Secondary | ICD-10-CM | POA: Diagnosis not present

## 2020-12-14 DIAGNOSIS — K635 Polyp of colon: Secondary | ICD-10-CM | POA: Diagnosis not present

## 2020-12-14 DIAGNOSIS — K317 Polyp of stomach and duodenum: Secondary | ICD-10-CM | POA: Diagnosis not present

## 2020-12-14 DIAGNOSIS — M79641 Pain in right hand: Secondary | ICD-10-CM | POA: Diagnosis not present

## 2020-12-14 NOTE — Telephone Encounter (Signed)
Call received by therapy, Dorena Cookey, PT from [facility] [phone] due to elevated B/P and protocol. B/P prior to call this am 154/102 and patient had been moving. Asymptomatic. Patient reports she does not take amlodipine until 1200  daily . PT rechecked B/P manually  for 150/86 continues to deny symptoms. Appt already scheduled for 12/16/20. Instructed patient to take medication and recheck B/P after an hour today and call back if greater than 130/80. Care advise given. Patient verbalized understanding of care advise and to call back if symptoms worsen.

## 2020-12-14 NOTE — Telephone Encounter (Signed)
Reason for Disposition  [3] Systolic BP  >= 570 OR Diastolic >= 80 AND [1] taking BP medications  Answer Assessment - Initial Assessment Questions 1. BLOOD PRESSURE: "What is the blood pressure?" "Did you take at least two measurements 5 minutes apart?"     Prior to call B/P 154/102  recheck B/P 150/86 2. ONSET: "When did you take your blood pressure?"     Now  3. HOW: "How did you obtain the blood pressure?" (e.g., visiting nurse, automatic home BP monitor)     Manual B/P cuff by PT 4. HISTORY: "Do you have a history of high blood pressure?"     Yes  5. MEDICATIONS: "Are you taking any medications for blood pressure?" "Have you missed any doses recently?"     Yes . Does not take amlodipine until lunch  6. OTHER SYMPTOMS: "Do you have any symptoms?" (e.g., headache, chest pain, blurred vision, difficulty breathing, weakness)     Denies  7. PREGNANCY: "Is there any chance you are pregnant?" "When was your last menstrual period?"     na  Protocols used: Blood Pressure - High-A-AH

## 2020-12-14 NOTE — Telephone Encounter (Signed)
Called patient back to review B/P . Patient reports she has not taken medication at this time due to waiting on someone to bring her lunch and then she will take medication amlodipine. Instructed patient to call clinic back if B/P elevated. Patient verbalized understanding .

## 2020-12-14 NOTE — Telephone Encounter (Signed)
Taylor [name] from [facility] called Reporting BP out of parameters / Pts BP was 154/102/ pt has no other symptoms and has not taken her BP meds yet

## 2020-12-15 DIAGNOSIS — K635 Polyp of colon: Secondary | ICD-10-CM | POA: Diagnosis not present

## 2020-12-15 DIAGNOSIS — M79641 Pain in right hand: Secondary | ICD-10-CM | POA: Diagnosis not present

## 2020-12-15 DIAGNOSIS — K317 Polyp of stomach and duodenum: Secondary | ICD-10-CM | POA: Diagnosis not present

## 2020-12-15 DIAGNOSIS — S32010A Wedge compression fracture of first lumbar vertebra, initial encounter for closed fracture: Secondary | ICD-10-CM | POA: Diagnosis not present

## 2020-12-15 DIAGNOSIS — M810 Age-related osteoporosis without current pathological fracture: Secondary | ICD-10-CM | POA: Diagnosis not present

## 2020-12-15 NOTE — Progress Notes (Signed)
Name: Taylor [name]   MRN: [id number]    DOB: [birth year]   Date:12/16/2020       Progress Note  Subjective  Chief Complaint  Follow up   HPI    DM: she forgot to bring her sugar logs, she is taking metformin 750 mg daily, today A 1 C is stable at 6.9%   She denies polyphagia, polydipsia or polyuria. . She has a history of microalbuminuria and has been taking  Benicar 20 mg  She has CKI and is under the care of nephrologist. She takes Lovaza and statin for dyslipidemia and denies side effects. She also has associated Obesity, HTN.    Morbid obesity: BMI  above 35 with DM, HTN, dyslipidemia, GERD, she has pain and difficulty standing up to prepare meals. Weight has gone up from 184 lbs to 191 lbs   Hearing loss: very difficulty to hear [place], had to repeat same question multiple times, she has hearing aids but forgot to put them on again.   HTN with CKI stage III: also has secondary hyperparathyroidism, last Pth was 116 07/2020 under the care of nephrologist, Dr. Candiss Norse. No pruritis noticed, BP today is lower than usual, advised to take Norvasc in the pm's starting tomorrow and olmasartan in ams'   Chronic pain: she has chronic right knee pain, under the care of pain clinic and uses a walker to assist with ambulation . She also has daily back pain and feels weak on her back, she is under the care of Dr. Andree Elk , she states she has difficulty taking care of her house, preparing meals, unable to get in her bath tub due to pain on right knee  Pain right now is worse on her right knee. She is getting help three days a week for a few hours to keep her house clean.    GERD: well controlled with Pepcid, no heartburn or indigestion, denies vomiting . Unchanged   Chronic constipation: she has Amitiza but only taking once a day and has bowel movements about twice a week but can go longer without a bowel movement , she went [facility] with abdominal pain, diagnosied with constipation and also had an UTI,  she took meds and asked to be rechecked today . Constipation has improved   Atherosclerosis aorta: on statin therapy not on aspirin because of  high risk of falls .Last LDL was at goal at 64    Osteoporosis: seeing Dr. Honor Junes for Prolia and is tolerating it well. Off Alendronate   Hyperparathyroidism: seeing endo , monitored   Patient lives alone and is not interested in placement. Daughter lives in [place] and her son is a Administrator and is usually not in town  She brought her medications with her today   Patient Active Problem List   Diagnosis Date Noted   Closed compression fracture of body of L1 vertebra ([place]) 09/23/2020   Gastric polyp    Polyp of colon    Morbid obesity ([place]) 04/08/2018   Age-related osteoporosis without current pathological fracture 09/26/2017   Pain in both hands 09/23/2017   Hyperparathyroidism ([place]) 08/06/2017   Chronic kidney disease, stage III (moderate) ([place]) 08/06/2017   HLD (hyperlipidemia) 07/25/2017   Osteoporotic compression fracture of spine ([place]) 03/22/2017   Assistance needed with transportation 03/21/2017   Iron deficiency anemia 12/19/2016   Aortic atherosclerosis ([place]) 11/23/2016   Anterolisthesis 11/23/2016   Chronic pain syndrome 09/13/2016   Vitamin D deficiency 08/19/2016   Anemia, unspecified  08/19/2016   Primary open angle glaucoma (POAG) of both eyes, severe stage 08/17/2016   Hypertensive retinopathy 08/17/2016   Long term current use of opiate analgesic 07/19/2016   Dyslipidemia associated with type 2 diabetes mellitus ([place]) 04/16/2016   Chronic midline low back pain without sciatica 04/16/2016   Chronic pain of right knee 04/16/2016   Vaginal dryness 02/22/2015   Calculus of kidney 11/10/2014   Osteoarthritis, multiple sites 09/09/2014   Type 2 diabetes mellitus with microalbuminuria ([place]) 09/09/2014   Glaucoma 09/09/2014   Benign hypertension with CKD (chronic kidney disease) stage III ([place]) 09/09/2014   Chronic  pain 09/09/2014   Tinea corporis 73/22/0254   Umbilical hernia 27/07/2374   Mass of right breast     Past Surgical History:  Procedure Laterality Date   ABDOMINAL HYSTERECTOMY  1958   menorrhagia   BACK SURGERY     BREAST BIOPSY Left 01/25/2012   BREAST BIOPSY Right 04/08/2019   hear clip, [place] bx, path pending   BREAST EXCISIONAL BIOPSY     BREAST LUMPECTOMY WITH NEEDLE LOCALIZATION Right 05/12/2019   Procedure: BREAST LUMPECTOMY WITH NEEDLE LOCALIZATION;  Surgeon: Olean Ree, MD;  Location: [facility] ORS;  Service: General;  Laterality: Right;   CATARACT EXTRACTION, BILATERAL Bilateral    1st 06/07/15 and the 2nd 06/21/15   CHOLECYSTECTOMY     COLONOSCOPY  2003   [facility]   COLONOSCOPY WITH PROPOFOL N/A 07/02/2019   Procedure: COLONOSCOPY WITH PROPOFOL;  Surgeon: Virgel Manifold, MD;  Location: [facility] ENDOSCOPY;  Service: Endoscopy;  Laterality: N/A;   COLONOSCOPY WITH PROPOFOL N/A 07/16/2019   Procedure: COLONOSCOPY WITH PROPOFOL;  Surgeon: Virgel Manifold, MD;  Location: [facility] ENDOSCOPY;  Service: Endoscopy;  Laterality: N/A;   ESOPHAGOGASTRODUODENOSCOPY (EGD) WITH PROPOFOL N/A 07/02/2019   Procedure: ESOPHAGOGASTRODUODENOSCOPY (EGD) WITH PROPOFOL;  Surgeon: Virgel Manifold, MD;  Location: [facility] ENDOSCOPY;  Service: Endoscopy;  Laterality: N/A;   EYE SURGERY     KNEE SURGERY  2009,2011,2012   twice on right and once on left   KNEE SURGERY     LIPOMA EXCISION  1998   SPINE SURGERY  2004   TONSILLECTOMY     age of 65    Family History  Problem Relation Age of Onset   Cancer Mother        skin cancer   Stroke Mother    Heart disease Father    Breast cancer Neg Hx     Social History   Tobacco Use   Smoking status: Never   Smokeless tobacco: Never  Substance Use Topics   Alcohol use: No    Alcohol/week: 0.0 standard drinks     Current Outpatient Medications:    acetaminophen (TYLENOL) 500 MG tablet, Take 1 tablet (500 mg total) by mouth 3 (three) times daily.  (Patient taking differently: Take 500 mg by mouth every 6 (six) hours as needed for moderate pain or headache.), Disp: 90 tablet, Rfl: 0   acetaminophen (TYLENOL) 500 MG tablet, Take by mouth., Disp: , Rfl:    amLODipine (NORVASC) 10 MG tablet, TAKE 1 TABLET BY MOUTH EVERY DAY, Disp: 90 tablet, Rfl: 1   brimonidine-timolol (COMBIGAN) 0.2-0.5 % ophthalmic solution, Place 1 drop into both eyes 2 (two) times daily. , Disp: , Rfl:    Calcium 250 MG CAPS, Take by mouth., Disp: , Rfl:    Calcium Carb-Cholecalciferol 500-10 MG-MCG TABS, Take by mouth., Disp: , Rfl:    Calcium Carbonate-Vitamin D (OYSTER SHELL CALCIUM 500 + D PO), Take  1 tablet by mouth 2 (two) times a day., Disp: , Rfl:    cephALEXin (KEFLEX) 250 MG capsule, Take 1 capsule (250 mg total) by mouth 3 (three) times daily., Disp: 21 capsule, Rfl: 0   denosumab (PROLIA) 60 MG/ML SOSY injection, Inject into the skin., Disp: , Rfl:    Docusate Sodium 100 MG capsule, Take 100 mg by mouth 2 (two) times daily., Disp: , Rfl:    dorzolamide (TRUSOPT) 2 % ophthalmic solution, 1 drop 2 (two) times daily., Disp: , Rfl:    famotidine (PEPCID) 20 MG tablet, TAKE 1 TABLET BY MOUTH EVERY DAY, Disp: 90 tablet, Rfl: 1   ferrous sulfate 325 (65 FE) MG tablet, TAKE 1 TABLET BY MOUTH EVERY DAY WITH BREAKFAST, Disp: 90 tablet, Rfl: 1   fluticasone (FLONASE) 50 MCG/ACT nasal spray, SPRAY 2 SPRAYS INTO EACH NOSTRIL EVERY DAY, Disp: 48 mL, Rfl: 1   lactulose (CHRONULAC) 10 GM/15ML solution, Take 30 mLs (20 g total) by mouth daily as needed for mild constipation., Disp: 120 mL, Rfl: 0   Lancets (ONETOUCH DELICA PLUS [id number]) MISC, 1 each by Does not apply route daily., Disp: 100 each, Rfl: 2   lubiprostone (AMITIZA) 24 MCG capsule, Take 24 mcg by mouth 2 (two) times daily., Disp: , Rfl:    metFORMIN (GLUCOPHAGE-XR) 750 MG 24 hr tablet, TAKE 2 TABLETS (1,500 MG TOTAL) BY MOUTH DAILY WITH BREAKFAST., Disp: 180 tablet, Rfl: 1   metFORMIN (GLUCOPHAGE-XR) 750 MG 24 hr  tablet, Take by mouth., Disp: , Rfl:    olmesartan (BENICAR) 20 MG tablet, Take 20 mg by mouth daily., Disp: , Rfl:    omega-3 acid ethyl esters (LOVAZA) 1 g capsule, TAKE 1 CAPSULE BY MOUTH TWICE A DAY, Disp: 180 capsule, Rfl: 1   ONETOUCH ULTRA test strip, USE AS DIRECTED, Disp: 100 strip, Rfl: 12   oxyCODONE-acetaminophen (PERCOCET) 7.5-325 MG tablet, Take 1 tablet by mouth every 8 (eight) hours as needed for moderate pain or severe pain., Disp: 90 tablet, Rfl: 0   pravastatin (PRAVACHOL) 40 MG tablet, TAKE 1 TABLET BY MOUTH EVERYDAY AT BEDTIME, Disp: 90 tablet, Rfl: 1   ROCKLATAN 0.02-0.005 % SOLN, Apply 1 drop to eye at bedtime., Disp: , Rfl:    Vitamin D, Ergocalciferol, (DRISDOL) 1.25 MG (50000 UNIT) CAPS capsule, Take 50,000 Units by mouth every 7 (seven) days., Disp: , Rfl:   Allergies  Allergen Reactions   Ace Inhibitors Cough    I personally reviewed active problem list, medication list, allergies, family history, social history with the patient/caregiver today.   ROS  Constitutional: Negative for fever or weight change.  Respiratory: Negative for cough and shortness of breath.   Cardiovascular: Negative for chest pain or palpitations.  Gastrointestinal: Negative for abdominal pain, no bowel changes.  Musculoskeletal: Negative for gait problem or joint swelling.  Skin: Negative for rash.  Neurological: Negative for dizziness or headache.  No other specific complaints in a complete review of systems (except as listed in HPI above).   Objective  Vitals:   12/16/20 1407  BP: 108/62  Pulse: 77  Resp: 14  Temp: 98.3 F (36.8 C)  TempSrc: Oral  SpO2: 98%  Weight: 191 lb (86.6 kg)  Height: 5' (1.524 m)    Body mass index is 37.3 kg/m.  Physical Exam  Constitutional: Patient appears well-developed and well-nourished. Obese  No distress.  HEENT: head atraumatic, normocephalic, pupils equal and reactive to light, neck supple, Cardiovascular: Normal rate, regular  rhythm and normal heart  sounds.  No murmur heard. Trace  BLE edema. Pulmonary/Chest: Effort normal and breath sounds normal. No respiratory distress. Abdominal: Soft.  There is no tenderness. Psychiatric: Patient has a normal mood and affect. behavior is normal. Judgment and thought content normal.   Recent Results (from the past 2160 hour(s))  Urinalysis, Routine w reflex microscopic     Status: None   Collection Time: 09/23/20  3:19 PM  Result Value Ref Range   Color, Urine YELLOW YELLOW   APPearance CLEAR CLEAR   Specific Gravity, Urine 1.013 1.001 - 1.035   pH 5.5 5.0 - 8.0   Glucose, UA NEGATIVE NEGATIVE   Bilirubin Urine NEGATIVE NEGATIVE   Ketones, ur NEGATIVE NEGATIVE   Hgb urine dipstick NEGATIVE NEGATIVE   Protein, ur NEGATIVE NEGATIVE   Nitrite NEGATIVE NEGATIVE   Leukocytes,Ua NEGATIVE NEGATIVE  Urine Culture     Status: None   Collection Time: 09/23/20  3:19 PM   Specimen: Urine  Result Value Ref Range   MICRO NUMBER: [id number]    SPECIMEN QUALITY: Adequate    Sample Source URINE    STATUS: FINAL    Result:      Mixed genital flora isolated. These superficial bacteria are not indicative of a urinary tract infection. No further organism identification is warranted on this specimen. If clinically indicated, recollect clean-catch, mid-stream urine and transfer  immediately to Urine Culture Transport Tube.   CBC with Differential     Status: Abnormal   Collection Time: 11/29/20 10:17 PM  Result Value Ref Range   WBC 11.8 (H) 4.0 - 10.5 K/uL   RBC 4.14 3.87 - 5.11 MIL/uL   Hemoglobin 13.0 12.0 - 15.0 g/dL   HCT 38.0 36.0 - 46.0 %   MCV 91.8 80.0 - 100.0 fL   MCH 31.4 26.0 - 34.0 pg   MCHC 34.2 30.0 - 36.0 g/dL   RDW 12.6 11.5 - 15.5 %   Platelets 331 150 - 400 K/uL   nRBC 0.0 0.0 - 0.2 %   Neutrophils Relative % 69 %   Neutro Abs 8.2 (H) 1.7 - 7.7 K/uL   Lymphocytes Relative 21 %   Lymphs Abs 2.5 0.7 - 4.0 K/uL   Monocytes Relative 8 %   Monocytes Absolute 1.0  0.1 - 1.0 K/uL   Eosinophils Relative 1 %   Eosinophils Absolute 0.1 0.0 - 0.5 K/uL   Basophils Relative 0 %   Basophils Absolute 0.0 0.0 - 0.1 K/uL   Immature Granulocytes 1 %   Abs Immature Granulocytes 0.06 0.00 - 0.07 K/uL    Comment: Performed at [facility], [address] [place], [place] [address]  Comprehensive metabolic panel     Status: Abnormal   Collection Time: 11/29/20 10:17 PM  Result Value Ref Range   Sodium 141 135 - 145 mmol/L   Potassium 4.3 3.5 - 5.1 mmol/L   Chloride 108 98 - 111 mmol/L   CO2 24 22 - 32 mmol/L   Glucose, Bld 134 (H) 70 - 99 mg/dL    Comment: Glucose reference range applies only to samples taken after fasting for at least 8 hours.   BUN 26 (H) 8 - 23 mg/dL   Creatinine, Ser 1.26 (H) 0.44 - 1.00 mg/dL   Calcium 9.7 8.9 - 10.3 mg/dL   Total Protein 7.1 6.5 - 8.1 g/dL   Albumin 4.1 3.5 - 5.0 g/dL   AST 11 (L) 15 - 41 U/L   ALT 7 0 - 44 U/L  Alkaline Phosphatase 41 38 - 126 U/L   Total Bilirubin 0.5 0.3 - 1.2 mg/dL   GFR, Estimated 42 (L) >60 mL/min    Comment: (NOTE) Calculated using the CKD-EPI Creatinine Equation (2021)    Anion gap 9 5 - 15    Comment: Performed at [facility], [address] [place], [place] [address]  Urinalysis, Routine w reflex microscopic     Status: Abnormal   Collection Time: 11/30/20  1:57 AM  Result Value Ref Range   Color, Urine YELLOW (A) YELLOW   APPearance HAZY (A) CLEAR   Specific Gravity, Urine 1.006 1.005 - 1.030   pH 8.0 5.0 - 8.0   Glucose, UA NEGATIVE NEGATIVE mg/dL   Hgb urine dipstick MODERATE (A) NEGATIVE   Bilirubin Urine NEGATIVE NEGATIVE   Ketones, ur NEGATIVE NEGATIVE mg/dL   Protein, ur NEGATIVE NEGATIVE mg/dL   Nitrite NEGATIVE NEGATIVE   Leukocytes,Ua LARGE (A) NEGATIVE   RBC / HPF 0-5 0 - 5 RBC/hpf   WBC, UA 21-50 0 - 5 WBC/hpf   Bacteria, UA RARE (A) NONE SEEN   Squamous Epithelial / LPF 0-5 0 - 5   Mucus PRESENT     Comment: Performed at [facility], [address], [place], [place] [address]  Urine Culture     Status: Abnormal   Collection Time: 11/30/20  1:57 AM   Specimen: Urine, Clean Catch  Result Value Ref Range   Specimen Description      URINE, CLEAN CATCH Performed at [facility], [address], [place], [place] [address]    Special Requests      NONE Performed at [facility], [address], [place], [place] [address]    Culture >=100,000 COLONIES/mL ESCHERICHIA COLI (A)    Report Status 12/02/2020 FINAL    Organism ID, Bacteria ESCHERICHIA COLI (A)       Susceptibility   Escherichia coli - MIC*    AMPICILLIN >=32 RESISTANT Resistant     CEFAZOLIN <=4 SENSITIVE Sensitive     CEFEPIME <=0.12 SENSITIVE Sensitive     CEFTRIAXONE <=0.25 SENSITIVE Sensitive     CIPROFLOXACIN <=0.25 SENSITIVE Sensitive     GENTAMICIN <=1 SENSITIVE Sensitive     IMIPENEM <=0.25 SENSITIVE Sensitive     NITROFURANTOIN <=16 SENSITIVE Sensitive     TRIMETH/SULFA <=20 SENSITIVE Sensitive     AMPICILLIN/SULBACTAM >=32 RESISTANT Resistant     PIP/TAZO <=4 SENSITIVE Sensitive     * >=100,000 COLONIES/mL ESCHERICHIA COLI  POCT HgB A1C     Status: Abnormal   Collection Time: 12/16/20  2:13 PM  Result Value Ref Range   Hemoglobin A1C 6.9 (A) 4.0 - 5.6 %   HbA1c POC (<> result, manual entry)     HbA1c, POC (prediabetic range)     HbA1c, POC (controlled diabetic range)      Diabetic Foot Exam: Diabetic Foot Exam - Simple   Simple Foot Form Diabetic Foot exam was performed with the following findings: Yes   Visual Inspection No deformities, no ulcerations, no other skin breakdown bilaterally: Yes Sensation Testing Intact to touch and monofilament testing bilaterally: Yes Pulse Check Posterior Tibialis and Dorsalis pulse intact bilaterally: Yes Comments     PHQ2/9: Depression screen [facility] 2/9 12/16/2020 10/10/2020 09/23/2020 08/17/2020 05/05/2020  Decreased Interest 0 0 1 0 0  Down, Depressed, Hopeless 0 0 1 0 0   PHQ - 2 Score 0 0 2 0 0  Altered sleeping - - 0 - -  Tired, decreased energy - - 1 - -  Change in appetite - - 1 - -  Feeling bad or failure about yourself  - - 1 - -  Trouble concentrating - - 0 - -  Moving slowly or fidgety/restless - - 3 - -  Suicidal thoughts - - 0 - -  PHQ-9 Score - - 8 - -  Difficult doing work/chores - - Somewhat difficult - -  Some recent data might be hidden    phq 9 is negative   Fall Risk: Fall Risk  12/16/2020 09/23/2020 08/17/2020 05/05/2020 04/18/2020  Falls in the past year? 1 1 0 0 0  Comment - - - - -  Number falls in past yr: 0 0 0 0 0  Injury with Fall? 0 1 0 0 0  Risk Factor Category  - - - - -  Risk for fall due to : Impaired balance/gait - - Impaired balance/gait;Impaired mobility;Impaired vision -  Follow up Falls prevention discussed - - Falls prevention discussed -      Functional Status Survey: Is the patient deaf or have difficulty hearing?: Yes Does the patient have difficulty seeing, even when wearing glasses/contacts?: Yes Does the patient have difficulty concentrating, remembering, or making decisions?: No Does the patient have difficulty walking or climbing stairs?: Yes Does the patient have difficulty dressing or bathing?: Yes Does the patient have difficulty doing errands alone such as visiting a doctor's office or shopping?: Yes    Assessment & Plan  1. Hypertension associated with type 2 diabetes mellitus ([place])   2. Type 2 diabetes mellitus with microalbuminuria, without long-term current use of insulin (H[facility])  - POCT HgB A1C  3. Need for Tdap vaccination  Cost   4. Stage 3a chronic kidney disease ([place])   5. History of UTI  - CULTURE, URINE COMPREHENSIVE  6. Hyperparathyroidism ([place])   7. GERD without esophagitis   8. Morbid obesity ([place])  Discussed with the patient the risk posed by an increased BMI. Discussed importance of portion control, calorie counting and at least 150 minutes of physical activity  weekly. Avoid sweet beverages and drink more water. Eat at least 6 servings of fruit and vegetables daily    9. Aortic atherosclerosis ([place])  On statin therapy   10. Need for immunization against influenza  - Flu Vaccine QUAD High Dose(Fluad)  11. Essential hypertension  Low bp today but asymptomatic   12. Age-related osteoporosis without current pathological fracture  Started on Prolia   13. Dyslipidemia associated with type 2 diabetes mellitus ([place])   14. Other iron deficiency anemia   15. Primary osteoarthritis involving multiple joints

## 2020-12-16 ENCOUNTER — Encounter: Payer: Self-pay | Admitting: Family Medicine

## 2020-12-16 ENCOUNTER — Ambulatory Visit (INDEPENDENT_AMBULATORY_CARE_PROVIDER_SITE_OTHER): Payer: Medicare Other | Admitting: Family Medicine

## 2020-12-16 ENCOUNTER — Other Ambulatory Visit: Payer: Self-pay

## 2020-12-16 VITALS — BP 108/62 | HR 77 | Temp 98.3°F | Resp 14 | Ht 60.0 in | Wt 191.0 lb

## 2020-12-16 DIAGNOSIS — E1129 Type 2 diabetes mellitus with other diabetic kidney complication: Secondary | ICD-10-CM | POA: Diagnosis not present

## 2020-12-16 DIAGNOSIS — Z87440 Personal history of urinary (tract) infections: Secondary | ICD-10-CM

## 2020-12-16 DIAGNOSIS — I152 Hypertension secondary to endocrine disorders: Secondary | ICD-10-CM

## 2020-12-16 DIAGNOSIS — M150 Primary generalized (osteo)arthritis: Secondary | ICD-10-CM

## 2020-12-16 DIAGNOSIS — Z23 Encounter for immunization: Secondary | ICD-10-CM | POA: Diagnosis not present

## 2020-12-16 DIAGNOSIS — I10 Essential (primary) hypertension: Secondary | ICD-10-CM | POA: Diagnosis not present

## 2020-12-16 DIAGNOSIS — E785 Hyperlipidemia, unspecified: Secondary | ICD-10-CM

## 2020-12-16 DIAGNOSIS — K219 Gastro-esophageal reflux disease without esophagitis: Secondary | ICD-10-CM | POA: Diagnosis not present

## 2020-12-16 DIAGNOSIS — E1159 Type 2 diabetes mellitus with other circulatory complications: Secondary | ICD-10-CM

## 2020-12-16 DIAGNOSIS — D508 Other iron deficiency anemias: Secondary | ICD-10-CM

## 2020-12-16 DIAGNOSIS — M810 Age-related osteoporosis without current pathological fracture: Secondary | ICD-10-CM | POA: Diagnosis not present

## 2020-12-16 DIAGNOSIS — N1831 Chronic kidney disease, stage 3a: Secondary | ICD-10-CM

## 2020-12-16 DIAGNOSIS — E213 Hyperparathyroidism, unspecified: Secondary | ICD-10-CM | POA: Diagnosis not present

## 2020-12-16 DIAGNOSIS — M159 Polyosteoarthritis, unspecified: Secondary | ICD-10-CM

## 2020-12-16 DIAGNOSIS — R809 Proteinuria, unspecified: Secondary | ICD-10-CM

## 2020-12-16 DIAGNOSIS — E1169 Type 2 diabetes mellitus with other specified complication: Secondary | ICD-10-CM | POA: Diagnosis not present

## 2020-12-16 DIAGNOSIS — I700 Atherosclerosis of aorta: Secondary | ICD-10-CM | POA: Diagnosis not present

## 2020-12-16 LAB — POCT GLYCOSYLATED HEMOGLOBIN (HGB A1C): Hemoglobin A1C: 6.9 % — AB (ref 4.0–5.6)

## 2020-12-19 ENCOUNTER — Ambulatory Visit: Payer: Self-pay | Admitting: *Deleted

## 2020-12-19 ENCOUNTER — Ambulatory Visit: Payer: Medicare Other | Attending: Anesthesiology | Admitting: Anesthesiology

## 2020-12-19 ENCOUNTER — Other Ambulatory Visit: Payer: Self-pay

## 2020-12-19 DIAGNOSIS — R809 Proteinuria, unspecified: Secondary | ICD-10-CM | POA: Diagnosis not present

## 2020-12-19 DIAGNOSIS — I152 Hypertension secondary to endocrine disorders: Secondary | ICD-10-CM | POA: Diagnosis not present

## 2020-12-19 DIAGNOSIS — E1129 Type 2 diabetes mellitus with other diabetic kidney complication: Secondary | ICD-10-CM

## 2020-12-19 DIAGNOSIS — E1159 Type 2 diabetes mellitus with other circulatory complications: Secondary | ICD-10-CM

## 2020-12-19 NOTE — Chronic Care Management (AMB) (Signed)
Chronic Care Management    Clinical Social Work Note  12/19/2020 Name: Taylor [name] MRN: [id number] DOB: June 03, 1932  Taylor [name] is a 87 y.o. year old female who is a primary care patient of [name], MD. The CCM team was consulted to assist the patient with chronic disease management and/or care coordination needs related to: [employer] .   Engaged with patient by telephone for follow up visit in response to provider referral for social work chronic care management and care coordination services.   Consent to Services:  The patient was given information about Chronic Care Management services, agreed to services, and gave verbal consent prior to initiation of services.  Please see initial visit note for detailed documentation.   Patient agreed to services and consent obtained.   Assessment: Review of patient past medical history, allergies, medications, and health status, including review of relevant consultants reports was performed today as part of a comprehensive evaluation and provision of chronic care management and care coordination services.     SDOH (Social Determinants of Health) assessments and interventions performed:    Advanced Directives Status: Not addressed in this encounter.  CCM Care Plan  Allergies  Allergen Reactions   Ace Inhibitors Cough    Outpatient Encounter Medications as of 12/19/2020  Medication Sig   acetaminophen (TYLENOL) 500 MG tablet Take 1 tablet (500 mg total) by mouth 3 (three) times daily. (Patient taking differently: Take 500 mg by mouth every 6 (six) hours as needed for moderate pain or headache.)   acetaminophen (TYLENOL) 500 MG tablet Take by mouth.   amLODipine (NORVASC) 10 MG tablet TAKE 1 TABLET BY MOUTH EVERY DAY   brimonidine-timolol (COMBIGAN) 0.2-0.5 % ophthalmic solution Place 1 drop into both eyes 2 (two) times daily.    Calcium 250 MG CAPS Take by mouth.   Calcium Carb-Cholecalciferol 500-10 MG-MCG TABS Take  by mouth.   Calcium Carbonate-Vitamin D (OYSTER SHELL CALCIUM 500 + D PO) Take 1 tablet by mouth 2 (two) times a day.   cephALEXin (KEFLEX) 250 MG capsule Take 1 capsule (250 mg total) by mouth 3 (three) times daily.   denosumab (PROLIA) 60 MG/ML SOSY injection Inject into the skin.   Docusate Sodium 100 MG capsule Take 100 mg by mouth 2 (two) times daily.   dorzolamide (TRUSOPT) 2 % ophthalmic solution 1 drop 2 (two) times daily.   famotidine (PEPCID) 20 MG tablet TAKE 1 TABLET BY MOUTH EVERY DAY   ferrous sulfate 325 (65 FE) MG tablet TAKE 1 TABLET BY MOUTH EVERY DAY WITH BREAKFAST   fluticasone (FLONASE) 50 MCG/ACT nasal spray SPRAY 2 SPRAYS INTO EACH NOSTRIL EVERY DAY   lactulose (CHRONULAC) 10 GM/15ML solution Take 30 mLs (20 g total) by mouth daily as needed for mild constipation.   Lancets (ONETOUCH DELICA PLUS [id number]) MISC 1 each by Does not apply route daily.   lubiprostone (AMITIZA) 24 MCG capsule Take 24 mcg by mouth 2 (two) times daily.   metFORMIN (GLUCOPHAGE-XR) 750 MG 24 hr tablet TAKE 2 TABLETS (1,500 MG TOTAL) BY MOUTH DAILY WITH BREAKFAST.   metFORMIN (GLUCOPHAGE-XR) 750 MG 24 hr tablet Take by mouth.   olmesartan (BENICAR) 20 MG tablet Take 20 mg by mouth daily.   omega-3 acid ethyl esters (LOVAZA) 1 g capsule TAKE 1 CAPSULE BY MOUTH TWICE A DAY   ONETOUCH ULTRA test strip USE AS DIRECTED   oxyCODONE-acetaminophen (PERCOCET) 7.5-325 MG tablet Take 1 tablet by mouth every 8 (eight) hours as needed for  moderate pain or severe pain.   pravastatin (PRAVACHOL) 40 MG tablet TAKE 1 TABLET BY MOUTH EVERYDAY AT BEDTIME   ROCKLATAN 0.02-0.005 % SOLN Apply 1 drop to eye at bedtime.   Vitamin D, Ergocalciferol, (DRISDOL) 1.25 MG (50000 UNIT) CAPS capsule Take 50,000 Units by mouth every 7 (seven) days.   No facility-administered encounter medications on file as of 12/19/2020.    Patient Active Problem List   Diagnosis Date Noted   Closed compression fracture of body of L1  vertebra ([place]) 09/23/2020   Gastric polyp    Polyp of colon    Morbid obesity ([place]) 04/08/2018   Age-related osteoporosis without current pathological fracture 09/26/2017   Pain in both hands 09/23/2017   Hyperparathyroidism ([place]) 08/06/2017   Chronic kidney disease, stage III (moderate) ([place]) 08/06/2017   HLD (hyperlipidemia) 07/25/2017   Osteoporotic compression fracture of spine ([place]) 03/22/2017   Assistance needed with transportation 03/21/2017   Iron deficiency anemia 12/19/2016   Aortic atherosclerosis ([place]) 11/23/2016   Anterolisthesis 11/23/2016   Chronic pain syndrome 09/13/2016   Vitamin D deficiency 08/19/2016   Anemia, unspecified 08/19/2016   Primary open angle glaucoma (POAG) of both eyes, severe stage 08/17/2016   Hypertensive retinopathy 08/17/2016   Long term current use of opiate analgesic 07/19/2016   Dyslipidemia associated with type 2 diabetes mellitus ([place]) 04/16/2016   Chronic midline low back pain without sciatica 04/16/2016   Chronic pain of right knee 04/16/2016   Vaginal dryness 02/22/2015   Calculus of kidney 11/10/2014   Osteoarthritis, multiple sites 09/09/2014   Type 2 diabetes mellitus with microalbuminuria ([place]) 09/09/2014   Glaucoma 09/09/2014   Benign hypertension with CKD (chronic kidney disease) stage III ([place]) 09/09/2014   Chronic pain 09/09/2014   Tinea corporis 29/92/4268   Umbilical hernia 34/19/6222   Mass of right breast    Conditions to be addressed/monitored: DMII; Level of care concerns  Care Plan : General Social Work (Adult)  Updates made by Vern Claude, LCSW since 12/19/2020 12:00 AM     Problem: Home and Family Safety (Wellness)      Goal: Home and Family Safety Maintained   Recent Progress: On track  Priority: Medium  Note:   Current Barriers:  Limited social support and Limited access to caregiver Suicidal Ideation/Homicidal Ideation: No  Clinical Social Work Goal(s):  Over the next 90 days, patient will work  with SW bi-weekly by telephone or in person to develop a plan for additional in home asisstance explore community resource options for unmet needs related [id number] in home care   Interventions: SDOH Interventions    Flowsheet Row Most Recent Value  SDOH Interventions   SDOH Interventions for the Following Domains Stress  Stress Interventions --  [assistance to be provided to assist with in home care]     Patient continues to confirm that she has now arranged for a private duty aid to provide care which started  on 11/21/20, 3 days per week for 3 hours per day-assistance going well Confirmed referral for [facility] services-RN has completed initial intake-PT/OT start date pending Patient continues to discuss concerns regarding lack of motivation to take medication regularly-lack of motivation explored-coping strategies discussed including use of reminder tools(when aid comes in, she reminds patient) developing a schedule and considering referral to CCM pharmacist for follow up Benefits of medication adherence discussed Patient continues to decline referral to CCM pharmacist but will continue to consider referral Supportive counseling provided regarding medication adherance Emotional Support and reassurance provided Self  care and safety emphasized 12/19/20 Phone call from patient requesting information/assistance in regards to [facility] services and when her next appointment will be with them. Collaboration phone call to [facility] to confirm that the PT and nurse will be out tomorrow. They will call patient this evening to confirm a time.  Patient Self Care Activities:  Calls provider office for new concerns or questions  Patient Coping Strengths:  Hopefulness Self Advocate Able to Communicate Effectively  Patient Self Care Deficits:  Unable to perform ADLs independently Unable to perform IADLs independently  Please see past updates related to this goal by clicking on the "Past  Updates" button in the selected goal           Follow Up Plan: SW will follow up with patient by phone over the next 14 business days      [place], [place] Worker  [facility] Care Management [phone]

## 2020-12-19 NOTE — Patient Instructions (Addendum)
Visit Information  PATIENT GOALS/PLAN OF CARE:  Care Plan : General Social Work (Adult)  Updates made by Vern Claude, LCSW since 12/19/2020 12:00 AM     Problem: Home and Family Safety (Wellness)      Goal: Home and Family Safety Maintained   Recent Progress: On track  Priority: Medium  Note:   Current Barriers:  Limited social support and Limited access to caregiver Suicidal Ideation/Homicidal Ideation: No  Clinical Social Work Goal(s):  Over the next 90 days, patient will work with SW bi-weekly by telephone or in person to develop a plan for additional in home asisstance explore community resource options for unmet needs related [id number] in home care   Interventions: SDOH Interventions    Flowsheet Row Most Recent Value  SDOH Interventions   SDOH Interventions for the Following Domains Stress  Stress Interventions --  [assistance to be provided to assist with in home care]     Patient continues to confirm that she has now arranged for a private duty aid to provide care which started  on 11/21/20, 3 days per week for 3 hours per day-assistance going well Confirmed referral for [facility] services-RN has completed initial intake-PT/OT start date pending Patient continues to discuss concerns regarding lack of motivation to take medication regularly-lack of motivation explored-coping strategies discussed including use of reminder tools(when aid comes in, she reminds patient) developing a schedule and considering referral to CCM pharmacist for follow up Benefits of medication adherence discussed Patient continues to decline referral to CCM pharmacist but will continue to consider referral Supportive counseling provided regarding medication adherance Emotional Support and reassurance provided Self care and safety emphasized 12/19/20 Phone call from patient requesting information/assistance in regards to [facility] services and when her next appointment will be with them.  Collaboration phone call to [facility] to confirm that the PT and nurse will be out tomorrow. They will call patient this evening to confirm a time.  Patient Self Care Activities:  Calls provider office for new concerns or questions  Patient Coping Strengths:  Hopefulness Self Advocate Able to Communicate Effectively  Patient Self Care Deficits:  Unable to perform ADLs independently Unable to perform IADLs independently  Please see past updates related to this goal by clicking on the "Past Updates" button in the selected goal          The patient verbalized understanding of instructions, educational materials, and care plan provided today and declined offer to receive copy of patient instructions, educational materials, and care plan.   Telephone follow up appointment with care management team member scheduled for: 12/26/20  [name], [place] Worker  [facility] Care Management [phone]

## 2020-12-20 DIAGNOSIS — E1122 Type 2 diabetes mellitus with diabetic chronic kidney disease: Secondary | ICD-10-CM | POA: Diagnosis not present

## 2020-12-20 DIAGNOSIS — M4856XD Collapsed vertebra, not elsewhere classified, lumbar region, subsequent encounter for fracture with routine healing: Secondary | ICD-10-CM | POA: Diagnosis not present

## 2020-12-20 DIAGNOSIS — N183 Chronic kidney disease, stage 3 unspecified: Secondary | ICD-10-CM | POA: Diagnosis not present

## 2020-12-20 DIAGNOSIS — M8008XD Age-related osteoporosis with current pathological fracture, vertebra(e), subsequent encounter for fracture with routine healing: Secondary | ICD-10-CM | POA: Diagnosis not present

## 2020-12-20 DIAGNOSIS — I152 Hypertension secondary to endocrine disorders: Secondary | ICD-10-CM | POA: Diagnosis not present

## 2020-12-20 DIAGNOSIS — E1159 Type 2 diabetes mellitus with other circulatory complications: Secondary | ICD-10-CM | POA: Diagnosis not present

## 2020-12-21 ENCOUNTER — Ambulatory Visit: Payer: Medicare Other | Attending: Anesthesiology | Admitting: Anesthesiology

## 2020-12-21 ENCOUNTER — Encounter: Payer: Self-pay | Admitting: Anesthesiology

## 2020-12-21 ENCOUNTER — Other Ambulatory Visit: Payer: Self-pay

## 2020-12-21 DIAGNOSIS — M79642 Pain in left hand: Secondary | ICD-10-CM | POA: Diagnosis not present

## 2020-12-21 DIAGNOSIS — M25561 Pain in right knee: Secondary | ICD-10-CM | POA: Diagnosis not present

## 2020-12-21 DIAGNOSIS — M25562 Pain in left knee: Secondary | ICD-10-CM

## 2020-12-21 DIAGNOSIS — M545 Low back pain, unspecified: Secondary | ICD-10-CM | POA: Diagnosis not present

## 2020-12-21 DIAGNOSIS — Z79891 Long term (current) use of opiate analgesic: Secondary | ICD-10-CM

## 2020-12-21 DIAGNOSIS — M79641 Pain in right hand: Secondary | ICD-10-CM

## 2020-12-21 DIAGNOSIS — G8929 Other chronic pain: Secondary | ICD-10-CM | POA: Diagnosis not present

## 2020-12-21 DIAGNOSIS — G894 Chronic pain syndrome: Secondary | ICD-10-CM

## 2020-12-21 MED ORDER — OXYCODONE-ACETAMINOPHEN 7.5-325 MG PO TABS: 1.0000 | ORAL_TABLET | Freq: Three times a day (TID) | ORAL | 0 refills | Status: AC | PRN

## 2020-12-21 NOTE — Progress Notes (Signed)
Virtual Visit via Telephone Note  I connected with Taylor [name] on 12/21/20 at  1:00 PM EDT by telephone and verified that I am speaking with the correct person using two identifiers.  Location: Patient: Home Provider: Pain control center   I discussed the limitations, risks, security and privacy concerns of performing an evaluation and management service by telephone and the availability of in person appointments. I also discussed with the patient that there may be a patient responsible charge related to this service. The patient expressed understanding and agreed to proceed.   History of Present Illness:  I spoke with Taylor [name] today via telephone as we were unable link for the video portion of the virtual conference.  She reports that she is doing reasonably well with her chronic pain status with pain affecting the right knee low back right hip and hands.  The quality characteristic and distribution of the pain is stable in nature.  No recent changes are noted.  She continues to take her Percocet 7.5 mg tablet 3 times a day and this continues to give her good relief where she has failed more conservative therapy.  She derives no side effects from the medication and continues to do better with improved lifestyle function better sleep and better ability to ambulate and get around.  Otherwise she is in her usual state of health at this time.  Review of systems: General: No fevers or chills Pulmonary: No shortness of breath or dyspnea Cardiac: No angina or palpitations or lightheadedness GI: No abdominal pain or constipation Psych: No depression  Observations/Objective:   Current Outpatient Medications:    [START ON 02/10/2021] oxyCODONE-acetaminophen (PERCOCET) 7.5-325 MG tablet, Take 1 tablet by mouth every 8 (eight) hours as needed for moderate pain or severe pain., Disp: 90 tablet, Rfl: 0   acetaminophen (TYLENOL) 500 MG tablet, Take 1 tablet (500 mg total) by mouth 3 (three) times  daily. (Patient taking differently: Take 500 mg by mouth every 6 (six) hours as needed for moderate pain or headache.), Disp: 90 tablet, Rfl: 0   acetaminophen (TYLENOL) 500 MG tablet, Take by mouth., Disp: , Rfl:    amLODipine (NORVASC) 10 MG tablet, TAKE 1 TABLET BY MOUTH EVERY DAY, Disp: 90 tablet, Rfl: 1   brimonidine-timolol (COMBIGAN) 0.2-0.5 % ophthalmic solution, Place 1 drop into both eyes 2 (two) times daily. , Disp: , Rfl:    Calcium 250 MG CAPS, Take by mouth., Disp: , Rfl:    Calcium Carb-Cholecalciferol 500-10 MG-MCG TABS, Take by mouth., Disp: , Rfl:    Calcium Carbonate-Vitamin D (OYSTER SHELL CALCIUM 500 + D PO), Take 1 tablet by mouth 2 (two) times a day., Disp: , Rfl:    cephALEXin (KEFLEX) 250 MG capsule, Take 1 capsule (250 mg total) by mouth 3 (three) times daily., Disp: 21 capsule, Rfl: 0   denosumab (PROLIA) 60 MG/ML SOSY injection, Inject into the skin., Disp: , Rfl:    Docusate Sodium 100 MG capsule, Take 100 mg by mouth 2 (two) times daily., Disp: , Rfl:    dorzolamide (TRUSOPT) 2 % ophthalmic solution, 1 drop 2 (two) times daily., Disp: , Rfl:    famotidine (PEPCID) 20 MG tablet, TAKE 1 TABLET BY MOUTH EVERY DAY, Disp: 90 tablet, Rfl: 1   ferrous sulfate 325 (65 FE) MG tablet, TAKE 1 TABLET BY MOUTH EVERY DAY WITH BREAKFAST, Disp: 90 tablet, Rfl: 1   fluticasone (FLONASE) 50 MCG/ACT nasal spray, SPRAY 2 SPRAYS INTO EACH NOSTRIL EVERY DAY, Disp:  48 mL, Rfl: 1   lactulose (CHRONULAC) 10 GM/15ML solution, Take 30 mLs (20 g total) by mouth daily as needed for mild constipation., Disp: 120 mL, Rfl: 0   Lancets (ONETOUCH DELICA PLUS [id number]) MISC, 1 each by Does not apply route daily., Disp: 100 each, Rfl: 2   lubiprostone (AMITIZA) 24 MCG capsule, Take 24 mcg by mouth 2 (two) times daily., Disp: , Rfl:    metFORMIN (GLUCOPHAGE-XR) 750 MG 24 hr tablet, TAKE 2 TABLETS (1,500 MG TOTAL) BY MOUTH DAILY WITH BREAKFAST., Disp: 180 tablet, Rfl: 1   metFORMIN (GLUCOPHAGE-XR) 750 MG  24 hr tablet, Take by mouth., Disp: , Rfl:    olmesartan (BENICAR) 20 MG tablet, Take 20 mg by mouth daily., Disp: , Rfl:    omega-3 acid ethyl esters (LOVAZA) 1 g capsule, TAKE 1 CAPSULE BY MOUTH TWICE A DAY, Disp: 180 capsule, Rfl: 1   ONETOUCH ULTRA test strip, USE AS DIRECTED, Disp: 100 strip, Rfl: 12   [START ON 01/11/2021] oxyCODONE-acetaminophen (PERCOCET) 7.5-325 MG tablet, Take 1 tablet by mouth every 8 (eight) hours as needed for moderate pain or severe pain., Disp: 90 tablet, Rfl: 0   pravastatin (PRAVACHOL) 40 MG tablet, TAKE 1 TABLET BY MOUTH EVERYDAY AT BEDTIME, Disp: 90 tablet, Rfl: 1   ROCKLATAN 0.02-0.005 % SOLN, Apply 1 drop to eye at bedtime., Disp: , Rfl:    Vitamin D, Ergocalciferol, (DRISDOL) 1.25 MG (50000 UNIT) CAPS capsule, Take 50,000 Units by mouth every 7 (seven) days., Disp: , Rfl:   Assessment and Plan: 1. Chronic pain syndrome   2. Chronic pain of right knee   3. Pain in both hands   4. Long term prescription opiate use   5. Chronic midline low back pain without sciatica   6. Chronic pain of both knees   Based on our discussion today and her current response to therapy I think it is appropriate to refill her medications.  These will be dated for November 23 and December 23.  I have reviewed the [facility] practitioner database information is appropriate for refills and she is stable with this current regimen.  She seems to be doing very well with chronic opioid therapy.  I encouraged her to continue efforts at ambulation stretching strengthening as usual and continue follow-up with her primary care physicians for baseline medical care.  Follow Up Instructions:    I discussed the assessment and treatment plan with the patient. The patient was provided an opportunity to ask questions and all were answered. The patient agreed with the plan and demonstrated an understanding of the instructions.   The patient was advised to call back or seek an in-person evaluation  if the symptoms worsen or if the condition fails to improve as anticipated.  I provided 30 minutes of non-face-to-face time during this encounter.   [name], MD

## 2020-12-22 DIAGNOSIS — I152 Hypertension secondary to endocrine disorders: Secondary | ICD-10-CM | POA: Diagnosis not present

## 2020-12-22 DIAGNOSIS — E1122 Type 2 diabetes mellitus with diabetic chronic kidney disease: Secondary | ICD-10-CM | POA: Diagnosis not present

## 2020-12-22 DIAGNOSIS — N183 Chronic kidney disease, stage 3 unspecified: Secondary | ICD-10-CM | POA: Diagnosis not present

## 2020-12-22 DIAGNOSIS — E1159 Type 2 diabetes mellitus with other circulatory complications: Secondary | ICD-10-CM | POA: Diagnosis not present

## 2020-12-22 DIAGNOSIS — M8008XD Age-related osteoporosis with current pathological fracture, vertebra(e), subsequent encounter for fracture with routine healing: Secondary | ICD-10-CM | POA: Diagnosis not present

## 2020-12-22 DIAGNOSIS — M4856XD Collapsed vertebra, not elsewhere classified, lumbar region, subsequent encounter for fracture with routine healing: Secondary | ICD-10-CM | POA: Diagnosis not present

## 2020-12-23 DIAGNOSIS — M79675 Pain in left toe(s): Secondary | ICD-10-CM | POA: Diagnosis not present

## 2020-12-23 DIAGNOSIS — M79674 Pain in right toe(s): Secondary | ICD-10-CM | POA: Diagnosis not present

## 2020-12-23 DIAGNOSIS — B351 Tinea unguium: Secondary | ICD-10-CM | POA: Diagnosis not present

## 2020-12-26 ENCOUNTER — Ambulatory Visit (INDEPENDENT_AMBULATORY_CARE_PROVIDER_SITE_OTHER): Payer: Medicare Other | Admitting: *Deleted

## 2020-12-26 DIAGNOSIS — R809 Proteinuria, unspecified: Secondary | ICD-10-CM

## 2020-12-26 DIAGNOSIS — E1129 Type 2 diabetes mellitus with other diabetic kidney complication: Secondary | ICD-10-CM

## 2020-12-26 DIAGNOSIS — I152 Hypertension secondary to endocrine disorders: Secondary | ICD-10-CM

## 2020-12-26 DIAGNOSIS — E1159 Type 2 diabetes mellitus with other circulatory complications: Secondary | ICD-10-CM

## 2020-12-26 NOTE — Chronic Care Management (AMB) (Signed)
Chronic Care Management    Clinical Social Work Note  12/26/2020 Name: Taylor [name] MRN: [id number] DOB: 07/22/1932  Taylor [name] is a 87 y.o. year old female who is a primary care patient of [name], MD. The CCM team was consulted to assist the patient with chronic disease management and/or care coordination needs related to: [employer]  and [employer].   Engaged with patient by telephone for follow up visit in response to provider referral for social work chronic care management and care coordination services.   Consent to Services:  The patient was given information about Chronic Care Management services, agreed to services, and gave verbal consent prior to initiation of services.  Please see initial visit note for detailed documentation.   Patient agreed to services and consent obtained.   Assessment: Review of patient past medical history, allergies, medications, and health status, including review of relevant consultants reports was performed today as part of a comprehensive evaluation and provision of chronic care management and care coordination services.     SDOH (Social Determinants of Health) assessments and interventions performed:    Advanced Directives Status: Not addressed in this encounter.  CCM Care Plan  Allergies  Allergen Reactions   Ace Inhibitors Cough    Outpatient Encounter Medications as of 12/26/2020  Medication Sig   acetaminophen (TYLENOL) 500 MG tablet Take 1 tablet (500 mg total) by mouth 3 (three) times daily. (Patient taking differently: Take 500 mg by mouth every 6 (six) hours as needed for moderate pain or headache.)   acetaminophen (TYLENOL) 500 MG tablet Take by mouth.   amLODipine (NORVASC) 10 MG tablet TAKE 1 TABLET BY MOUTH EVERY DAY   brimonidine-timolol (COMBIGAN) 0.2-0.5 % ophthalmic solution Place 1 drop into both eyes 2 (two) times daily.    Calcium 250 MG CAPS Take by mouth.   Calcium  Carb-Cholecalciferol 500-10 MG-MCG TABS Take by mouth.   Calcium Carbonate-Vitamin D (OYSTER SHELL CALCIUM 500 + D PO) Take 1 tablet by mouth 2 (two) times a day.   cephALEXin (KEFLEX) 250 MG capsule Take 1 capsule (250 mg total) by mouth 3 (three) times daily.   denosumab (PROLIA) 60 MG/ML SOSY injection Inject into the skin.   Docusate Sodium 100 MG capsule Take 100 mg by mouth 2 (two) times daily.   dorzolamide (TRUSOPT) 2 % ophthalmic solution 1 drop 2 (two) times daily.   famotidine (PEPCID) 20 MG tablet TAKE 1 TABLET BY MOUTH EVERY DAY   ferrous sulfate 325 (65 FE) MG tablet TAKE 1 TABLET BY MOUTH EVERY DAY WITH BREAKFAST   fluticasone (FLONASE) 50 MCG/ACT nasal spray SPRAY 2 SPRAYS INTO EACH NOSTRIL EVERY DAY   lactulose (CHRONULAC) 10 GM/15ML solution Take 30 mLs (20 g total) by mouth daily as needed for mild constipation.   Lancets (ONETOUCH DELICA PLUS [id number]) MISC 1 each by Does not apply route daily.   lubiprostone (AMITIZA) 24 MCG capsule Take 24 mcg by mouth 2 (two) times daily.   metFORMIN (GLUCOPHAGE-XR) 750 MG 24 hr tablet TAKE 2 TABLETS (1,500 MG TOTAL) BY MOUTH DAILY WITH BREAKFAST.   metFORMIN (GLUCOPHAGE-XR) 750 MG 24 hr tablet Take by mouth.   olmesartan (BENICAR) 20 MG tablet Take 20 mg by mouth daily.   omega-3 acid ethyl esters (LOVAZA) 1 g capsule TAKE 1 CAPSULE BY MOUTH TWICE A DAY   ONETOUCH ULTRA test strip USE AS DIRECTED   [START ON 01/11/2021] oxyCODONE-acetaminophen (PERCOCET) 7.5-325 MG tablet Take 1 tablet  by mouth every 8 (eight) hours as needed for moderate pain or severe pain.   [START ON 02/10/2021] oxyCODONE-acetaminophen (PERCOCET) 7.5-325 MG tablet Take 1 tablet by mouth every 8 (eight) hours as needed for moderate pain or severe pain.   pravastatin (PRAVACHOL) 40 MG tablet TAKE 1 TABLET BY MOUTH EVERYDAY AT BEDTIME   ROCKLATAN 0.02-0.005 % SOLN Apply 1 drop to eye at bedtime.   Vitamin D, Ergocalciferol, (DRISDOL) 1.25 MG (50000 UNIT) CAPS capsule  Take 50,000 Units by mouth every 7 (seven) days.   No facility-administered encounter medications on file as of 12/26/2020.    Patient Active Problem List   Diagnosis Date Noted   Closed compression fracture of body of L1 vertebra ([place]) 09/23/2020   Gastric polyp    Polyp of colon    Morbid obesity ([place]) 04/08/2018   Age-related osteoporosis without current pathological fracture 09/26/2017   Pain in both hands 09/23/2017   Hyperparathyroidism ([place]) 08/06/2017   Chronic kidney disease, stage III (moderate) ([place]) 08/06/2017   HLD (hyperlipidemia) 07/25/2017   Osteoporotic compression fracture of spine ([place]) 03/22/2017   Assistance needed with transportation 03/21/2017   Iron deficiency anemia 12/19/2016   Aortic atherosclerosis ([place]) 11/23/2016   Anterolisthesis 11/23/2016   Chronic pain syndrome 09/13/2016   Vitamin D deficiency 08/19/2016   Anemia, unspecified 08/19/2016   Primary open angle glaucoma (POAG) of both eyes, severe stage 08/17/2016   Hypertensive retinopathy 08/17/2016   Long term current use of opiate analgesic 07/19/2016   Dyslipidemia associated with type 2 diabetes mellitus ([place]) 04/16/2016   Chronic midline low back pain without sciatica 04/16/2016   Chronic pain of right knee 04/16/2016   Vaginal dryness 02/22/2015   Calculus of kidney 11/10/2014   Osteoarthritis, multiple sites 09/09/2014   Type 2 diabetes mellitus with microalbuminuria ([place]) 09/09/2014   Glaucoma 09/09/2014   Benign hypertension with CKD (chronic kidney disease) stage III ([place]) 09/09/2014   Chronic pain 09/09/2014   Tinea corporis 18/84/1660   Umbilical hernia 63/02/6008   Mass of right breast     Conditions to be addressed/monitored: HTN and DMII; Limited social support and Level of care concerns  Care Plan : General Social Work (Adult)  Updates made by Vern Claude, LCSW since 12/26/2020 12:00 AM     Problem: Home and Family Safety (Wellness)      Goal: Home and Family  Safety Maintained   This Visit's Progress: On track  Recent Progress: On track  Priority: Medium  Note:   Current Barriers:  Limited social support and Limited access to caregiver Suicidal Ideation/Homicidal Ideation: No  Clinical Social Work Goal(s):  Over the next 90 days, patient will work with SW bi-weekly by telephone or in person to develop a plan for additional in home asisstance explore community resource options for unmet needs related XN:[id number] in home care   Interventions: SDOH Interventions    Flowsheet Row Most Recent Value  SDOH Interventions   SDOH Interventions for the Following Domains Stress  Stress Interventions --  [assistance to be provided to assist with in home care]     Patient continues to confirm that private duty aid to provide care 3 days per week for 3 hours per day-assistance going well Confirmed referral for [facility] services-RN has completed initial intake-PT coming tomorrow Patient continues to discuss concerns regarding lack of motivation to take medication regularly-lack of motivation explored-coping strategies discussed including use of reminder tools(when aid comes in, she reminds patient) developing a schedule  Benefits of  medication adherence discussed-confirms assistance from aid with medication reminders beneficial Patient discussed plan to visit son in [place] for approx 2 weeks-taking multiple breaks throughout trip as well as medication adherence encouraged Supportive counseling provided regarding medication adherence Emotional Support and reassurance provided Self care and safety emphasized  Patient Self Care Activities:  Calls provider office for new concerns or questions  Patient Coping Strengths:  Hopefulness Self Advocate Able to Communicate Effectively  Patient Self Care Deficits:  Unable to perform ADLs independently Unable to perform IADLs independently  Please see past updates related to this goal by clicking on the "Past  Updates" button in the selected goal           Follow Up Plan: SW will follow up with patient by phone over the next 14-21 business days      [place], [place] Worker  [facility] Care Management [phone]

## 2020-12-26 NOTE — Patient Instructions (Signed)
Visit Information  PATIENT GOALS/PLAN OF CARE:  Care Plan : General Social Work (Adult)  Updates made by Vern Claude, LCSW since 12/26/2020 12:00 AM     Problem: Home and Family Safety (Wellness)      Goal: Home and Family Safety Maintained   This Visit's Progress: On track  Recent Progress: On track  Priority: Medium  Note:   Current Barriers:  Limited social support and Limited access to caregiver Suicidal Ideation/Homicidal Ideation: No  Clinical Social Work Goal(s):  Over the next 90 days, patient will work with SW bi-weekly by telephone or in person to develop a plan for additional in home asisstance explore community resource options for unmet needs related [id number] in home care   Interventions: SDOH Interventions    Flowsheet Row Most Recent Value  SDOH Interventions   SDOH Interventions for the Following Domains Stress  Stress Interventions --  [assistance to be provided to assist with in home care]     Patient continues to confirm that private duty aid to provide care 3 days per week for 3 hours per day-assistance going well Confirmed referral for [facility] services-RN has completed initial intake-PT coming tomorrow Patient continues to discuss concerns regarding lack of motivation to take medication regularly-lack of motivation explored-coping strategies discussed including use of reminder tools(when aid comes in, she reminds patient) developing a schedule  Benefits of medication adherence discussed-confirms assistance from aid with medication reminders beneficial Patient discussed plan to visit son in [place] for approx 2 weeks-taking multiple breaks throughout trip as well as medication adherence encouraged Supportive counseling provided regarding medication adherence Emotional Support and reassurance provided Self care and safety emphasized  Patient Self Care Activities:  Calls provider office for new concerns or questions  Patient Coping Strengths:   Hopefulness Self Advocate Able to Communicate Effectively  Patient Self Care Deficits:  Unable to perform ADLs independently Unable to perform IADLs independently  Please see past updates related to this goal by clicking on the "Past Updates" button in the selected goal         The patient verbalized understanding of instructions, educational materials, and care plan provided today and declined offer to receive copy of patient instructions, educational materials, and care plan.   Telephone follow up appointment with care management team member scheduled for:01/24/21  [name], [place] Worker  [facility] Care Management [phone]

## 2020-12-27 DIAGNOSIS — E1122 Type 2 diabetes mellitus with diabetic chronic kidney disease: Secondary | ICD-10-CM | POA: Diagnosis not present

## 2020-12-27 DIAGNOSIS — E1159 Type 2 diabetes mellitus with other circulatory complications: Secondary | ICD-10-CM | POA: Diagnosis not present

## 2020-12-27 DIAGNOSIS — M4856XD Collapsed vertebra, not elsewhere classified, lumbar region, subsequent encounter for fracture with routine healing: Secondary | ICD-10-CM | POA: Diagnosis not present

## 2020-12-27 DIAGNOSIS — M8008XD Age-related osteoporosis with current pathological fracture, vertebra(e), subsequent encounter for fracture with routine healing: Secondary | ICD-10-CM | POA: Diagnosis not present

## 2020-12-27 DIAGNOSIS — N183 Chronic kidney disease, stage 3 unspecified: Secondary | ICD-10-CM | POA: Diagnosis not present

## 2020-12-27 DIAGNOSIS — I152 Hypertension secondary to endocrine disorders: Secondary | ICD-10-CM | POA: Diagnosis not present

## 2020-12-29 DIAGNOSIS — I152 Hypertension secondary to endocrine disorders: Secondary | ICD-10-CM | POA: Diagnosis not present

## 2020-12-29 DIAGNOSIS — M4856XD Collapsed vertebra, not elsewhere classified, lumbar region, subsequent encounter for fracture with routine healing: Secondary | ICD-10-CM | POA: Diagnosis not present

## 2020-12-29 DIAGNOSIS — E1122 Type 2 diabetes mellitus with diabetic chronic kidney disease: Secondary | ICD-10-CM | POA: Diagnosis not present

## 2020-12-29 DIAGNOSIS — N183 Chronic kidney disease, stage 3 unspecified: Secondary | ICD-10-CM | POA: Diagnosis not present

## 2020-12-29 DIAGNOSIS — M8008XD Age-related osteoporosis with current pathological fracture, vertebra(e), subsequent encounter for fracture with routine healing: Secondary | ICD-10-CM | POA: Diagnosis not present

## 2020-12-29 DIAGNOSIS — E1159 Type 2 diabetes mellitus with other circulatory complications: Secondary | ICD-10-CM | POA: Diagnosis not present

## 2021-01-03 DIAGNOSIS — M8008XD Age-related osteoporosis with current pathological fracture, vertebra(e), subsequent encounter for fracture with routine healing: Secondary | ICD-10-CM | POA: Diagnosis not present

## 2021-01-03 DIAGNOSIS — N183 Chronic kidney disease, stage 3 unspecified: Secondary | ICD-10-CM | POA: Diagnosis not present

## 2021-01-03 DIAGNOSIS — E1159 Type 2 diabetes mellitus with other circulatory complications: Secondary | ICD-10-CM | POA: Diagnosis not present

## 2021-01-03 DIAGNOSIS — M4856XD Collapsed vertebra, not elsewhere classified, lumbar region, subsequent encounter for fracture with routine healing: Secondary | ICD-10-CM | POA: Diagnosis not present

## 2021-01-03 DIAGNOSIS — I152 Hypertension secondary to endocrine disorders: Secondary | ICD-10-CM | POA: Diagnosis not present

## 2021-01-03 DIAGNOSIS — E1122 Type 2 diabetes mellitus with diabetic chronic kidney disease: Secondary | ICD-10-CM | POA: Diagnosis not present

## 2021-01-05 ENCOUNTER — Telehealth: Payer: Self-pay

## 2021-01-05 DIAGNOSIS — E1169 Type 2 diabetes mellitus with other specified complication: Secondary | ICD-10-CM

## 2021-01-05 NOTE — Progress Notes (Addendum)
Chronic Care Management Pharmacy Assistant   Name: Taylor [name]  MRN: [id number] DOB: 09/18/1932  Reason for Encounter: Diabetes Disease State Call   Recent office visits:  12/16/2020 [name], MD (PCP Office Visit) for Follow-up- No medication changes noted, lab order placed, no follow-up noted  Recent consult visits:  12/23/2020 Yvetta Coder, DPM for Follow-up- No medication changes noted, no orders placed, no follow-up noted  12/21/2020 [name], MD (Pain Management) for Follow-up- No medication changes noted, no orders placed, patient instructed to follow-up in 2 months  11/14/2020 [name], MD (Nephrology) for Follow-up- No medication changes noted, lab order placed, patient to follow-up in 3 months  Hospital visits:  Medication Reconciliation was completed by comparing discharge summary, patient's EMR and Pharmacy list, and upon discussion with patient.  Admitted to the hospital on 11/29/2020 due to Constipation . Discharge date was 11/30/2020. Discharged from [facility] Emergency Department.    New?Medications Started at [facility] Discharge:?? -Started cephALEXin 250 MG capsule Take 1 capsule (250 mg total) by mouth 3 (three) times daily. lactulose 10 GM/15ML solution Take 30 mLs (20 g total) by mouth daily as needed for mild constipation.  Medication Changes at Hospital Discharge: -Changed None ID  Medications Discontinued at Hospital Discharge: -Stopped None ID  Medications that remain the same after Hospital Discharge:??  -All other medications will remain the same.    Medications: Outpatient Encounter Medications as of 01/05/2021  Medication Sig   acetaminophen (TYLENOL) 500 MG tablet Take 1 tablet (500 mg total) by mouth 3 (three) times daily. (Patient taking differently: Take 500 mg by mouth every 6 (six) hours as needed for moderate pain or headache.)   acetaminophen (TYLENOL) 500 MG tablet Take by mouth.    amLODipine (NORVASC) 10 MG tablet TAKE 1 TABLET BY MOUTH EVERY DAY   brimonidine-timolol (COMBIGAN) 0.2-0.5 % ophthalmic solution Place 1 drop into both eyes 2 (two) times daily.    Calcium 250 MG CAPS Take by mouth.   Calcium Carb-Cholecalciferol 500-10 MG-MCG TABS Take by mouth.   Calcium Carbonate-Vitamin D (OYSTER SHELL CALCIUM 500 + D PO) Take 1 tablet by mouth 2 (two) times a day.   cephALEXin (KEFLEX) 250 MG capsule Take 1 capsule (250 mg total) by mouth 3 (three) times daily.   denosumab (PROLIA) 60 MG/ML SOSY injection Inject into the skin.   Docusate Sodium 100 MG capsule Take 100 mg by mouth 2 (two) times daily.   dorzolamide (TRUSOPT) 2 % ophthalmic solution 1 drop 2 (two) times daily.   famotidine (PEPCID) 20 MG tablet TAKE 1 TABLET BY MOUTH EVERY DAY   ferrous sulfate 325 (65 FE) MG tablet TAKE 1 TABLET BY MOUTH EVERY DAY WITH BREAKFAST   fluticasone (FLONASE) 50 MCG/ACT nasal spray SPRAY 2 SPRAYS INTO EACH NOSTRIL EVERY DAY   lactulose (CHRONULAC) 10 GM/15ML solution Take 30 mLs (20 g total) by mouth daily as needed for mild constipation.   Lancets (ONETOUCH DELICA PLUS [id number]) MISC 1 each by Does not apply route daily.   lubiprostone (AMITIZA) 24 MCG capsule Take 24 mcg by mouth 2 (two) times daily.   metFORMIN (GLUCOPHAGE-XR) 750 MG 24 hr tablet TAKE 2 TABLETS (1,500 MG TOTAL) BY MOUTH DAILY WITH BREAKFAST.   metFORMIN (GLUCOPHAGE-XR) 750 MG 24 hr tablet Take by mouth.   olmesartan (BENICAR) 20 MG tablet Take 20 mg by mouth daily.   omega-3 acid ethyl esters (LOVAZA) 1 g capsule TAKE 1 CAPSULE BY MOUTH TWICE A  DAY   ONETOUCH ULTRA test strip USE AS DIRECTED   [START ON 01/11/2021] oxyCODONE-acetaminophen (PERCOCET) 7.5-325 MG tablet Take 1 tablet by mouth every 8 (eight) hours as needed for moderate pain or severe pain.   [START ON 02/10/2021] oxyCODONE-acetaminophen (PERCOCET) 7.5-325 MG tablet Take 1 tablet by mouth every 8 (eight) hours as needed for moderate pain or  severe pain.   pravastatin (PRAVACHOL) 40 MG tablet TAKE 1 TABLET BY MOUTH EVERYDAY AT BEDTIME   ROCKLATAN 0.02-0.005 % SOLN Apply 1 drop to eye at bedtime.   Vitamin D, Ergocalciferol, (DRISDOL) 1.25 MG (50000 UNIT) CAPS capsule Take 50,000 Units by mouth every 7 (seven) days.   No facility-administered encounter medications on file as of 01/05/2021.   Care Gaps: Zoster Vaccines Diabetic Eye Exam COVID-19 Vaccine Booster 3  Star Rating Drugs: Metformin 750 mg last filled on 10/11/2020 for a 90-Day supply with [facility] Olmesartan 20 mg Last filled on 04/02/2020 for a 90-Day supply with [facility] Pravastatin 40 mg last filled on 11/30/2020 for a 90-Day supply with [facility]  [facility] to [facility], and the representative advised that the prescription has not been filled since 04/02/2020 and the prescription for this medication has expired. CPP has been notified  Recent Relevant Labs: Lab Results  Component Value Date/Time   HGBA1C 6.9 (A) 12/16/2020 02:13 PM   HGBA1C 6.8 (A) 08/17/2020 01:22 PM   HGBA1C 6.5 04/17/2019 02:08 PM   HGBA1C 6.6 08/13/2018 02:51 PM   HGBA1C 7.8 (A) 08/05/2017 11:30 AM   MICROALBUR 1.8 04/17/2019 03:12 PM   MICROALBUR 10.4 04/09/2018 02:43 PM   MICROALBUR 50 08/05/2017 11:28 AM   MICROALBUR 100 12/12/2015 12:21 PM    Kidney Function Lab Results  Component Value Date/Time   CREATININE 1.26 (H) 11/29/2020 10:17 PM   CREATININE 1.10 (H) 09/05/2020 07:48 PM   CREATININE 1.35 (H) 12/28/2019 02:00 PM   CREATININE 1.21 (H) 04/17/2019 03:12 PM   GFRNONAA 42 (L) 11/29/2020 10:17 PM   GFRNONAA 36 (L) 12/28/2019 02:00 PM   GFRAA 42 (L) 12/28/2019 02:00 PM    Current antihyperglycemic regimen:  Metformin 750 mg twice daily  What recent interventions/DTPs have been made to improve glycemic control:  None ID  Have there been any recent hospitalizations or ED visits since last visit with CPP? Yes  Patient denies hypoglycemic symptoms, including Pale,  Sweaty, Shaky, Hungry, Nervous/irritable, and Vision changes  Patient denies hyperglycemic symptoms, including blurry vision, excessive thirst, fatigue, polyuria, and weakness  How often are you checking your blood sugar? once daily  What are your blood sugars ranging?  Fasting: 130-160  During the week, how often does your blood glucose drop below 70? Never  Are you checking your feet daily/regularly?  Yes, patient stated she just saw podiatry   Adherence Review: Is the patient currently on a STATIN medication? Yes Is the patient currently on ACE/ARB medication? Yes Does the patient have >5 day gap between last estimated fill dates? Yes  Patient reports that she is doing well. She stated that she is feeling pretty good today. She denies any ill symptoms. Patient stated she is trying to do better with medication compliance. Patient stated that she is working on making sure her A1C is lower next time. She is trying to eat better although she reports she can't eat much. Patient stated that she is going to leave next Tuesday for 2 weeks to go visit her son in [place], and she is very excited about that  visit. Patient denies needing any refills on anything at this time. Patient at the moment does not want to switch to [facility], but stated she would think about it.   Addendum 11/18  Junius Argyle, CPP sent me a message this morning stating that he spoke with Dr. Ancil Boozer, and she does want the patient to start back on Olmesartan 20 mg daily for kidney protection. He did advise that if she starts back on this medication she will need to decrease her Amlodipine to 5 mg daily .   I spoke to the patient, and advised her that per Dr. Ancil Boozer that she will start taking the Olmesartan 20 mg daily for kidney protection and decrease the Amlodipine to 5 mg daily. I informed her that we will call the medication into her pharmacy today. I instructed her that if she has any questions about the instructions or  does not remember anything that I advised her to call me back directly at [phone]. Patient verbalized understanding to all.   CPP sent a [facility].   Lynann Bologna, CPA/CMA Clinical Pharmacist Assistant Phone: [phone]

## 2021-01-06 MED ORDER — AMLODIPINE BESYLATE 5 MG PO TABS: 5.0000 mg | ORAL_TABLET | Freq: Every day | ORAL | 1 refills | Status: DC

## 2021-01-06 MED ORDER — OLMESARTAN MEDOXOMIL 20 MG PO TABS: 20.0000 mg | ORAL_TABLET | Freq: Every day | ORAL | 1 refills | Status: DC

## 2021-01-06 NOTE — Addendum Note (Signed)
Addended by: Daron Offer A on: 01/06/2021 02:37 PM   Modules accepted: Orders

## 2021-01-10 ENCOUNTER — Telehealth: Payer: Self-pay

## 2021-01-10 DIAGNOSIS — M8008XD Age-related osteoporosis with current pathological fracture, vertebra(e), subsequent encounter for fracture with routine healing: Secondary | ICD-10-CM | POA: Diagnosis not present

## 2021-01-10 DIAGNOSIS — E1159 Type 2 diabetes mellitus with other circulatory complications: Secondary | ICD-10-CM | POA: Diagnosis not present

## 2021-01-10 DIAGNOSIS — N183 Chronic kidney disease, stage 3 unspecified: Secondary | ICD-10-CM | POA: Diagnosis not present

## 2021-01-10 DIAGNOSIS — I152 Hypertension secondary to endocrine disorders: Secondary | ICD-10-CM | POA: Diagnosis not present

## 2021-01-10 DIAGNOSIS — M4856XD Collapsed vertebra, not elsewhere classified, lumbar region, subsequent encounter for fracture with routine healing: Secondary | ICD-10-CM | POA: Diagnosis not present

## 2021-01-10 DIAGNOSIS — E1122 Type 2 diabetes mellitus with diabetic chronic kidney disease: Secondary | ICD-10-CM | POA: Diagnosis not present

## 2021-01-10 NOTE — Telephone Encounter (Signed)
Copied from [place] [phone]. Topic: General - Other >> Jan 10, 2021 12:00 PM Tessa Lerner A wrote: Reason for CRM: The patient's home health nurse has called to further inquire about a prescription the patient was expecting   The patient shares that they were going to be prescribed a medication to help with their kidneys but was uncertain of the name of the medication or if it has been prescribed  The patient's home health provider Alyse Low would like to be contacted by a member of clinical staff to discuss this further   Please contact

## 2021-01-10 NOTE — Telephone Encounter (Signed)
Spoke with pharmacist and patient picked up Olmesartan and Amlodipine on 01/09/21 for 90-day supplies each. Called Christy from [facility] and let her know the Olmesartan was the one Dr Ancil Boozer had prescribed for her kidneys. She was pleasant and expressed understanding and had no further questions at this time.

## 2021-01-18 DIAGNOSIS — I152 Hypertension secondary to endocrine disorders: Secondary | ICD-10-CM | POA: Diagnosis not present

## 2021-01-18 DIAGNOSIS — E1159 Type 2 diabetes mellitus with other circulatory complications: Secondary | ICD-10-CM | POA: Diagnosis not present

## 2021-01-18 DIAGNOSIS — R809 Proteinuria, unspecified: Secondary | ICD-10-CM | POA: Diagnosis not present

## 2021-01-18 DIAGNOSIS — E1129 Type 2 diabetes mellitus with other diabetic kidney complication: Secondary | ICD-10-CM | POA: Diagnosis not present

## 2021-01-23 ENCOUNTER — Telehealth: Payer: Medicare Other | Admitting: *Deleted

## 2021-01-23 ENCOUNTER — Telehealth: Payer: Self-pay | Admitting: *Deleted

## 2021-01-23 NOTE — Telephone Encounter (Signed)
  Care Management   Follow Up Note   01/23/2021 Name: VERENIS NICOSIA MRN: [id number] DOB: 1932-06-04   Referred by: [name], MD Reason for referral : Care Coordination   An unsuccessful telephone outreach was attempted today. The patient was referred to the case management team for assistance with care management and care coordination.   Follow Up Plan: Telephone follow up appointment with care management team member scheduled for: 01/30/21  [name], [place] Worker  [facility] Care Management [phone]

## 2021-01-24 ENCOUNTER — Ambulatory Visit: Payer: Self-pay | Admitting: *Deleted

## 2021-01-24 DIAGNOSIS — E1169 Type 2 diabetes mellitus with other specified complication: Secondary | ICD-10-CM

## 2021-01-24 DIAGNOSIS — E1159 Type 2 diabetes mellitus with other circulatory complications: Secondary | ICD-10-CM

## 2021-01-25 DIAGNOSIS — E1122 Type 2 diabetes mellitus with diabetic chronic kidney disease: Secondary | ICD-10-CM | POA: Diagnosis not present

## 2021-01-25 DIAGNOSIS — M4856XD Collapsed vertebra, not elsewhere classified, lumbar region, subsequent encounter for fracture with routine healing: Secondary | ICD-10-CM | POA: Diagnosis not present

## 2021-01-25 DIAGNOSIS — M8008XD Age-related osteoporosis with current pathological fracture, vertebra(e), subsequent encounter for fracture with routine healing: Secondary | ICD-10-CM | POA: Diagnosis not present

## 2021-01-25 DIAGNOSIS — E1159 Type 2 diabetes mellitus with other circulatory complications: Secondary | ICD-10-CM | POA: Diagnosis not present

## 2021-01-25 DIAGNOSIS — I152 Hypertension secondary to endocrine disorders: Secondary | ICD-10-CM | POA: Diagnosis not present

## 2021-01-25 DIAGNOSIS — N183 Chronic kidney disease, stage 3 unspecified: Secondary | ICD-10-CM | POA: Diagnosis not present

## 2021-01-25 NOTE — Patient Instructions (Signed)
Visit Information  Thank you for taking time to visit with me today. Please don't hesitate to contact me if I can be of assistance to you before our next scheduled telephone appointment.  Following are the goals we discussed today:  ollow-up on any referrals for help I am given for private duty care - think ahead to make sure my need does not become an emergency - have a back-up plan - make a list of family or friends that I can call  -discuss medication adherence with your provider  -continue with Nanine Means for [facility] services   If you are experiencing a [place] or [place] or need someone to talk to, please call the Suicide and Crisis Lifeline: 988   The patient verbalized understanding of instructions, educational materials, and care plan provided today and declined offer to receive copy of patient instructions, educational materials, and care plan.   No further follow up required: patient to contact this Education officer, museum with any additional community resource needs  [employer], [place] Worker  [facility] Care Management [phone]

## 2021-01-25 NOTE — Chronic Care Management (AMB) (Signed)
Chronic Care Management    Clinical Social Work Note  01/25/2021 Name: Taylor [name] MRN: [id number] DOB: September 08, 1932  Taylor [name] is a 87 y.o. year old female who is a primary care patient of [name], MD. The CCM team was consulted to assist the patient with chronic disease management and/or care coordination needs related to: [employer] .   Engaged with patient by telephone for follow up visit in response to provider referral for social work chronic care management and care coordination services.   Consent to Services:  The patient was given information about Chronic Care Management services, agreed to services, and gave verbal consent prior to initiation of services.  Please see initial visit note for detailed documentation.   Patient agreed to services and consent obtained.   Assessment: Review of patient past medical history, allergies, medications, and health status, including review of relevant consultants reports was performed today as part of a comprehensive evaluation and provision of chronic care management and care coordination services.     SDOH (Social Determinants of Health) assessments and interventions performed:    Advanced Directives Status: Not addressed in this encounter.  CCM Care Plan  Allergies  Allergen Reactions   Ace Inhibitors Cough    Outpatient Encounter Medications as of 01/24/2021  Medication Sig   acetaminophen (TYLENOL) 500 MG tablet Take 1 tablet (500 mg total) by mouth 3 (three) times daily. (Patient taking differently: Take 500 mg by mouth every 6 (six) hours as needed for moderate pain or headache.)   acetaminophen (TYLENOL) 500 MG tablet Take by mouth.   amLODipine (NORVASC) 5 MG tablet Take 1 tablet (5 mg total) by mouth daily.   brimonidine-timolol (COMBIGAN) 0.2-0.5 % ophthalmic solution Place 1 drop into both eyes 2 (two) times daily.    Calcium 250 MG CAPS Take by mouth.   Calcium Carb-Cholecalciferol 500-10 MG-MCG  TABS Take by mouth.   Calcium Carbonate-Vitamin D (OYSTER SHELL CALCIUM 500 + D PO) Take 1 tablet by mouth 2 (two) times a day.   cephALEXin (KEFLEX) 250 MG capsule Take 1 capsule (250 mg total) by mouth 3 (three) times daily.   denosumab (PROLIA) 60 MG/ML SOSY injection Inject into the skin.   Docusate Sodium 100 MG capsule Take 100 mg by mouth 2 (two) times daily.   dorzolamide (TRUSOPT) 2 % ophthalmic solution 1 drop 2 (two) times daily.   famotidine (PEPCID) 20 MG tablet TAKE 1 TABLET BY MOUTH EVERY DAY   ferrous sulfate 325 (65 FE) MG tablet TAKE 1 TABLET BY MOUTH EVERY DAY WITH BREAKFAST   fluticasone (FLONASE) 50 MCG/ACT nasal spray SPRAY 2 SPRAYS INTO EACH NOSTRIL EVERY DAY   lactulose (CHRONULAC) 10 GM/15ML solution Take 30 mLs (20 g total) by mouth daily as needed for mild constipation.   Lancets (ONETOUCH DELICA PLUS [id number]) MISC 1 each by Does not apply route daily.   lubiprostone (AMITIZA) 24 MCG capsule Take 24 mcg by mouth 2 (two) times daily.   metFORMIN (GLUCOPHAGE-XR) 750 MG 24 hr tablet TAKE 2 TABLETS (1,500 MG TOTAL) BY MOUTH DAILY WITH BREAKFAST.   metFORMIN (GLUCOPHAGE-XR) 750 MG 24 hr tablet Take by mouth.   olmesartan (BENICAR) 20 MG tablet Take 1 tablet (20 mg total) by mouth daily.   omega-3 acid ethyl esters (LOVAZA) 1 g capsule TAKE 1 CAPSULE BY MOUTH TWICE A DAY   ONETOUCH ULTRA test strip USE AS DIRECTED   oxyCODONE-acetaminophen (PERCOCET) 7.5-325 MG tablet Take 1 tablet by mouth every 8 (  eight) hours as needed for moderate pain or severe pain.   [START ON 02/10/2021] oxyCODONE-acetaminophen (PERCOCET) 7.5-325 MG tablet Take 1 tablet by mouth every 8 (eight) hours as needed for moderate pain or severe pain.   pravastatin (PRAVACHOL) 40 MG tablet TAKE 1 TABLET BY MOUTH EVERYDAY AT BEDTIME   ROCKLATAN 0.02-0.005 % SOLN Apply 1 drop to eye at bedtime.   Vitamin D, Ergocalciferol, (DRISDOL) 1.25 MG (50000 UNIT) CAPS capsule Take 50,000 Units by mouth every 7 (seven)  days.   No facility-administered encounter medications on file as of 01/24/2021.    Patient Active Problem List   Diagnosis Date Noted   Closed compression fracture of body of L1 vertebra ([place]) 09/23/2020   Gastric polyp    Polyp of colon    Morbid obesity ([place]) 04/08/2018   Age-related osteoporosis without current pathological fracture 09/26/2017   Pain in both hands 09/23/2017   Hyperparathyroidism ([place]) 08/06/2017   Chronic kidney disease, stage III (moderate) ([place]) 08/06/2017   HLD (hyperlipidemia) 07/25/2017   Osteoporotic compression fracture of spine ([place]) 03/22/2017   Assistance needed with transportation 03/21/2017   Iron deficiency anemia 12/19/2016   Aortic atherosclerosis ([place]) 11/23/2016   Anterolisthesis 11/23/2016   Chronic pain syndrome 09/13/2016   Vitamin D deficiency 08/19/2016   Anemia, unspecified 08/19/2016   Primary open angle glaucoma (POAG) of both eyes, severe stage 08/17/2016   Hypertensive retinopathy 08/17/2016   Long term current use of opiate analgesic 07/19/2016   Dyslipidemia associated with type 2 diabetes mellitus ([place]) 04/16/2016   Chronic midline low back pain without sciatica 04/16/2016   Chronic pain of right knee 04/16/2016   Vaginal dryness 02/22/2015   Calculus of kidney 11/10/2014   Osteoarthritis, multiple sites 09/09/2014   Type 2 diabetes mellitus with microalbuminuria ([place]) 09/09/2014   Glaucoma 09/09/2014   Benign hypertension with CKD (chronic kidney disease) stage III ([place]) 09/09/2014   Chronic pain 09/09/2014   Tinea corporis 44/02/270   Umbilical hernia 53/66/4403   Mass of right breast     Conditions to be addressed/monitored: HTN and DMII; Limited social support and Level of care concerns;   Care Plan : General Social Work (Adult)  Updates made by Vern Claude, LCSW since 01/25/2021 12:00 AM     Problem: Home and Family Safety (Wellness)      Goal: Home and Family Safety Maintained   This Visit's Progress: On  track  Recent Progress: On track  Priority: Medium  Note:   Current Barriers:  Limited social support and Limited access to caregiver Suicidal Ideation/Homicidal Ideation: No  Clinical Social Work Goal(s):  Over the next 90 days, patient [name] work with SW bi-weekly by telephone or in person to develop a plan for additional in home asisstance explore community resource options for unmet needs related [id number] in home care   Interventions: SDOH Interventions    Flowsheet Row Most Recent Value  SDOH Interventions   SDOH Interventions for the Following Domains Stress  Stress Interventions --  [assistance to be provided to assist with in home care]     Patient continues to confirm that she has a private duty aid to assist with her ADl's-however aid only coming 1 day per week Confirmed that [facility] services [name] now resume--PT coming tomorrow Patient confirmed trip to see her son went well and has helped with her  motivation to take medication regularly-patient's family supportive and assisted with medication reminders Benefits of medication adherence discussed-confirms assistance and support from family helped with  adherence as she plans to continue the practice of taking her medication regularly Supportive counseling provided regarding medication adherence Self care and safety emphasized Assessed for additional community resource needs-patient confirmed no additional needs at this time.  Patient Self Care Activities:  Calls provider office for new concerns or questions  Patient Coping Strengths:  Hopefulness Self Advocate Able to Communicate Effectively  Patient Self Care Deficits:  Unable to perform ADLs independently Unable to perform IADLs independently  Please see past updates related to this goal by clicking on the "Past Updates" button in the selected goal           Follow Up Plan: Client [name] contact this social worker with any additional community resource needs       [place], [place] Worker  [facility] Care Management [phone]

## 2021-01-27 DIAGNOSIS — I152 Hypertension secondary to endocrine disorders: Secondary | ICD-10-CM | POA: Diagnosis not present

## 2021-01-27 DIAGNOSIS — E1159 Type 2 diabetes mellitus with other circulatory complications: Secondary | ICD-10-CM | POA: Diagnosis not present

## 2021-01-27 DIAGNOSIS — N183 Chronic kidney disease, stage 3 unspecified: Secondary | ICD-10-CM | POA: Diagnosis not present

## 2021-01-27 DIAGNOSIS — E1122 Type 2 diabetes mellitus with diabetic chronic kidney disease: Secondary | ICD-10-CM | POA: Diagnosis not present

## 2021-01-27 DIAGNOSIS — M8008XD Age-related osteoporosis with current pathological fracture, vertebra(e), subsequent encounter for fracture with routine healing: Secondary | ICD-10-CM | POA: Diagnosis not present

## 2021-01-27 DIAGNOSIS — M4856XD Collapsed vertebra, not elsewhere classified, lumbar region, subsequent encounter for fracture with routine healing: Secondary | ICD-10-CM | POA: Diagnosis not present

## 2021-01-30 ENCOUNTER — Telehealth: Payer: Medicare Other

## 2021-01-31 DIAGNOSIS — M8008XD Age-related osteoporosis with current pathological fracture, vertebra(e), subsequent encounter for fracture with routine healing: Secondary | ICD-10-CM | POA: Diagnosis not present

## 2021-01-31 DIAGNOSIS — E1122 Type 2 diabetes mellitus with diabetic chronic kidney disease: Secondary | ICD-10-CM | POA: Diagnosis not present

## 2021-01-31 DIAGNOSIS — I152 Hypertension secondary to endocrine disorders: Secondary | ICD-10-CM | POA: Diagnosis not present

## 2021-01-31 DIAGNOSIS — N183 Chronic kidney disease, stage 3 unspecified: Secondary | ICD-10-CM | POA: Diagnosis not present

## 2021-01-31 DIAGNOSIS — E1159 Type 2 diabetes mellitus with other circulatory complications: Secondary | ICD-10-CM | POA: Diagnosis not present

## 2021-01-31 DIAGNOSIS — M4856XD Collapsed vertebra, not elsewhere classified, lumbar region, subsequent encounter for fracture with routine healing: Secondary | ICD-10-CM | POA: Diagnosis not present

## 2021-02-02 ENCOUNTER — Telehealth: Payer: Self-pay

## 2021-02-02 NOTE — Progress Notes (Signed)
Chronic Care Management Pharmacy Assistant   Name: Taylor [name]  MRN: [id number] DOB: 1932-05-03  Reason for Encounter: Hypertension Disease State Call   Recent office visits:  None ID  Recent consult visits:  None ID  Hospital visits:  None in previous 6 months  Medications: Outpatient Encounter Medications as of 02/02/2021  Medication Sig   acetaminophen (TYLENOL) 500 MG tablet Take 1 tablet (500 mg total) by mouth 3 (three) times daily. (Patient taking differently: Take 500 mg by mouth every 6 (six) hours as needed for moderate pain or headache.)   acetaminophen (TYLENOL) 500 MG tablet Take by mouth.   amLODipine (NORVASC) 5 MG tablet Take 1 tablet (5 mg total) by mouth daily.   brimonidine-timolol (COMBIGAN) 0.2-0.5 % ophthalmic solution Place 1 drop into both eyes 2 (two) times daily.    Calcium 250 MG CAPS Take by mouth.   Calcium Carb-Cholecalciferol 500-10 MG-MCG TABS Take by mouth.   Calcium Carbonate-Vitamin D (OYSTER SHELL CALCIUM 500 + D PO) Take 1 tablet by mouth 2 (two) times a day.   cephALEXin (KEFLEX) 250 MG capsule Take 1 capsule (250 mg total) by mouth 3 (three) times daily.   denosumab (PROLIA) 60 MG/ML SOSY injection Inject into the skin.   Docusate Sodium 100 MG capsule Take 100 mg by mouth 2 (two) times daily.   dorzolamide (TRUSOPT) 2 % ophthalmic solution 1 drop 2 (two) times daily.   famotidine (PEPCID) 20 MG tablet TAKE 1 TABLET BY MOUTH EVERY DAY   ferrous sulfate 325 (65 FE) MG tablet TAKE 1 TABLET BY MOUTH EVERY DAY WITH BREAKFAST   fluticasone (FLONASE) 50 MCG/ACT nasal spray SPRAY 2 SPRAYS INTO EACH NOSTRIL EVERY DAY   lactulose (CHRONULAC) 10 GM/15ML solution Take 30 mLs (20 g total) by mouth daily as needed for mild constipation.   Lancets (ONETOUCH DELICA PLUS [id number]) MISC 1 each by Does not apply route daily.   lubiprostone (AMITIZA) 24 MCG capsule Take 24 mcg by mouth 2 (two) times daily.   metFORMIN (GLUCOPHAGE-XR) 750 MG 24 hr  tablet TAKE 2 TABLETS (1,500 MG TOTAL) BY MOUTH DAILY WITH BREAKFAST.   metFORMIN (GLUCOPHAGE-XR) 750 MG 24 hr tablet Take by mouth.   olmesartan (BENICAR) 20 MG tablet Take 1 tablet (20 mg total) by mouth daily.   omega-3 acid ethyl esters (LOVAZA) 1 g capsule TAKE 1 CAPSULE BY MOUTH TWICE A DAY   ONETOUCH ULTRA test strip USE AS DIRECTED   oxyCODONE-acetaminophen (PERCOCET) 7.5-325 MG tablet Take 1 tablet by mouth every 8 (eight) hours as needed for moderate pain or severe pain.   [START ON 02/10/2021] oxyCODONE-acetaminophen (PERCOCET) 7.5-325 MG tablet Take 1 tablet by mouth every 8 (eight) hours as needed for moderate pain or severe pain.   pravastatin (PRAVACHOL) 40 MG tablet TAKE 1 TABLET BY MOUTH EVERYDAY AT BEDTIME   ROCKLATAN 0.02-0.005 % SOLN Apply 1 drop to eye at bedtime.   Vitamin D, Ergocalciferol, (DRISDOL) 1.25 MG (50000 UNIT) CAPS capsule Take 50,000 Units by mouth every 7 (seven) days.   No facility-administered encounter medications on file as of 02/02/2021.   Care Gaps: Zoster Vaccine Diabetic Eye Exam COVID-19 Booster 3  Star Rating Drugs: Metformin 750 mg last filled on 01/07/2021 for a 90-Day supply with [facility] Olmesartan 20 mg Last filled on 01/06/2021 for a 90-Day supply with [facility] Pravastatin 40 mg last filled on 11/30/2020 for a 90-Day supply with [facility]  Reviewed chart prior to disease state call.  Spoke with patient regarding BP  Recent Office Vitals: BP Readings from Last 3 Encounters:  12/16/20 108/62  11/30/20 (!) 155/90  09/23/20 122/64   Pulse Readings from Last 3 Encounters:  12/16/20 77  11/30/20 96  09/23/20 91    Wt Readings from Last 3 Encounters:  12/16/20 191 lb (86.6 kg)  11/29/20 180 lb (81.6 kg)  09/23/20 184 lb (83.5 kg)     Kidney Function Lab Results  Component Value Date/Time   CREATININE 1.26 (H) 11/29/2020 10:17 PM   CREATININE 1.10 (H) 09/05/2020 07:48 PM   CREATININE 1.35 (H) 12/28/2019 02:00 PM    CREATININE 1.21 (H) 04/17/2019 03:12 PM   GFRNONAA 42 (L) 11/29/2020 10:17 PM   GFRNONAA 36 (L) 12/28/2019 02:00 PM   GFRAA 42 (L) 12/28/2019 02:00 PM    BMP Latest Ref Rng & Units 11/29/2020 09/05/2020 12/28/2019  Glucose 70 - 99 mg/dL 134(H) 194(H) 200(H)  BUN 8 - 23 mg/dL 26(H) 18 21  Creatinine 0.44 - 1.00 mg/dL 1.26(H) 1.10(H) 1.35(H)  BUN/Creat Ratio 6 - 22 (calc) - - 16  Sodium 135 - 145 mmol/L 141 140 141  Potassium 3.5 - 5.1 mmol/L 4.3 3.6 5.1  Chloride 98 - 111 mmol/L 108 111 109  CO2 22 - 32 mmol/L 24 21(L) 22  Calcium 8.9 - 10.3 mg/dL 9.7 8.9 9.7    Current antihypertensive regimen:  Amlodipine 5 mg 1 tablet daily Olmesartan 20 mg 1 tablet daily  How often are you checking your Blood Pressure? 3-5x per week  Current home BP readings: Patient stated that she can't remember what the number was, but she does remember it was good.  What recent interventions/DTPs have been made by any provider to improve Blood Pressure control since last CPP Visit: Patient recently started back on her Olmesartan 20 mg daily and Amlodipine was decreased to 5 mg daily   Any recent hospitalizations or ED visits since last visit with CPP? No  What diet changes have been made to improve Blood Pressure Control?  Patient is not on any particular diet, but patient states she doesn't each much because she just can't.   What exercise is being done to improve your Blood Pressure Control?  Patient does not exercise.   Adherence Review: Is the patient currently on ACE/ARB medication? Yes Does the patient have >5 day gap between last estimated fill dates? No  Patient has an upcoming telephone appointment with Junius Argyle, CPP 04/19/2021 @ 1545  Patient reports that she is feeling pretty good today. She denies any ill symptoms at this time. Patient stated that she had a nice time with her son for Thanksgiving. She seems to be more up beat, and happier since she is back home from visiting with her son.  Patient stated that she had a really good Thanksgiving. Patient stated that she is doing well with Olmesartan she denies any issues since she started back taking the medication. Patient reports that she did almost fall a few days ago. She does not really know what prompted the fall as she reports the area she was in was clear, but she landed in the chair that she sits in vs the floor. Patient stated that she did not hurt herself, and denies any dizziness at that time. Patient has no other concerns or issues at this time. Patient encouraged to give me a call if she needs anything.   Lynann Bologna, CPA/CMA Clinical Pharmacist Assistant Phone: [phone]

## 2021-02-18 ENCOUNTER — Other Ambulatory Visit: Payer: Self-pay | Admitting: Family Medicine

## 2021-02-18 DIAGNOSIS — E7800 Pure hypercholesterolemia, unspecified: Secondary | ICD-10-CM

## 2021-02-18 NOTE — Telephone Encounter (Signed)
Requested Prescriptions  Pending Prescriptions Disp Refills   pravastatin (PRAVACHOL) 40 MG tablet [Pharmacy Med Name: PRAVASTATIN SODIUM 40 MG TAB] 90 tablet 0    Sig: TAKE 1 TABLET BY MOUTH EVERYDAY AT BEDTIME     Cardiovascular:  Antilipid - Statins Failed - 02/18/2021  2:22 AM      Failed - Total Cholesterol in normal range and within 360 days    Cholesterol, Total  Date Value Ref Range Status  08/09/2015 127 100 - 199 mg/dL Final   Cholesterol  Date Value Ref Range Status  12/28/2019 136 <200 mg/dL Final         Failed - LDL in normal range and within 360 days    LDL Cholesterol (Calc)  Date Value Ref Range Status  12/28/2019 64 mg/dL (calc) Final    Comment:    Reference range: <100 . Desirable range <100 mg/dL for primary prevention;   <70 mg/dL for patients with CHD or diabetic patients  with > or = 2 CHD risk factors. [name] LDL-C is now calculated using the Martin-Hopkins  calculation, which is a validated novel method providing  better accuracy than the Friedewald equation in the  estimation of LDL-C.  Cresenciano Genre et al. Annamaria Helling. 5993;570(17): 2061-2068  (http://education.[employer].com/faq/FAQ164)          Failed - HDL in normal range and within 360 days    HDL  Date Value Ref Range Status  12/28/2019 47 (L) > OR = 50 mg/dL Final  08/09/2015 47 >39 mg/dL Final         Failed - Triglycerides in normal range and within 360 days    Triglycerides  Date Value Ref Range Status  12/28/2019 168 (H) <150 mg/dL Final         Passed - Patient is not pregnant      Passed - Valid encounter within last 12 months    Recent Outpatient Visits          2 months ago Hypertension associated with type 2 diabetes mellitus [facility])   [facility] [place], Drue Stager, MD   4 months ago Need for assistance due to unsteady gait   [facility] [place], Kristeen Miss, PA-C   4 months ago Need for assistance due to unsteady gait   [place] Delsa Grana, PA-C   6 months ago Type 2 diabetes mellitus with microalbuminuria, without long-term current use of insulin [facility])   [facility] [name], MD   10 months ago Essential hypertension   [facility] [name], MD      Future Appointments            In 1 month Ancil Boozer, Drue Stager, MD [facility], [place]   In 2 months  [facility], [facility]

## 2021-02-27 ENCOUNTER — Other Ambulatory Visit: Payer: Self-pay | Admitting: Family Medicine

## 2021-02-27 DIAGNOSIS — K219 Gastro-esophageal reflux disease without esophagitis: Secondary | ICD-10-CM

## 2021-03-05 ENCOUNTER — Other Ambulatory Visit: Payer: Self-pay | Admitting: Family Medicine

## 2021-03-05 DIAGNOSIS — K5909 Other constipation: Secondary | ICD-10-CM

## 2021-03-05 NOTE — Telephone Encounter (Signed)
Requested medication (s) are due for refill today:   Requested medication (s) are on the active medication list: historical med  Last refill:  12/15/20  Future visit scheduled: yes  Notes to clinic:  historical provider   Requested Prescriptions  Pending Prescriptions Disp Refills   lubiprostone (AMITIZA) 24 MCG capsule [Pharmacy Med Name: LUBIPROSTONE 24 MCG CAPSULE] 180 capsule 1    Sig: TAKE 1 CAPSULE (24 MCG TOTAL) BY MOUTH 2 (TWO) TIMES DAILY WITH A MEAL.     Gastroenterology: Irritable Bowel Syndrome Passed - 03/05/2021 12:42 AM      Passed - Valid encounter within last 12 months    Recent Outpatient Visits           2 months ago Hypertension associated with type 2 diabetes mellitus [facility])   [facility] [name], MD   5 months ago Need for assistance due to unsteady gait   [facility] [place], Kristeen Miss, PA-C   5 months ago Need for assistance due to unsteady gait   [facility] Delsa Grana, PA-C   6 months ago Type 2 diabetes mellitus with microalbuminuria, without long-term current use of insulin [facility])   [facility] [name], MD   10 months ago Essential hypertension   [facility] [name], MD       Future Appointments             In 2 weeks [name], MD [facility], [place]   In 2 months  [facility], [facility]

## 2021-03-06 ENCOUNTER — Other Ambulatory Visit: Payer: Self-pay

## 2021-03-14 ENCOUNTER — Other Ambulatory Visit: Payer: Self-pay

## 2021-03-14 ENCOUNTER — Ambulatory Visit: Payer: Medicare Other | Admitting: Anesthesiology

## 2021-03-14 MED ORDER — OXYCODONE-ACETAMINOPHEN 7.5-325 MG PO TABS: 1.0000 | ORAL_TABLET | Freq: Three times a day (TID) | ORAL | 0 refills | Status: DC | PRN

## 2021-03-14 MED ORDER — OXYCODONE-ACETAMINOPHEN 7.5-325 MG PO TABS: 1.0000 | ORAL_TABLET | Freq: Three times a day (TID) | ORAL | 0 refills | Status: AC | PRN

## 2021-03-20 NOTE — Progress Notes (Deleted)
Name: Taylor [name]   MRN: [id number]    DOB: [birth year]   Date:03/20/2021       Progress Note  Subjective  Chief Complaint  Follow Up  HPI  DM: she forgot to bring her sugar logs, she is taking metformin 750 mg daily, today A 1 C is stable at 6.9%   She denies polyphagia, polydipsia or polyuria. . She has a history of microalbuminuria and has been taking  Benicar 20 mg  She has CKI and is under the care of nephrologist. She takes Lovaza and statin for dyslipidemia and denies side effects. She also has associated Obesity, HTN.    Morbid obesity: BMI  above 35 with DM, HTN, dyslipidemia, GERD, she has pain and difficulty standing up to prepare meals. Weight has gone up from 184 lbs to 191 lbs   Hearing loss: very difficulty to hear [place], had to repeat same question multiple times, she has hearing aids but forgot to put them on again.   HTN with CKI stage III: also has secondary hyperparathyroidism, last Pth was 116 07/2020 under the care of nephrologist, Dr. Candiss Norse. No pruritis noticed, BP today is lower than usual, advised to take Norvasc in the pm's starting tomorrow and olmasartan in ams'   Chronic pain: she has chronic right knee pain, under the care of pain clinic and uses a walker to assist with ambulation . She also has daily back pain and feels weak on her back, she is under the care of Dr. Andree Elk , she states she has difficulty taking care of her house, preparing meals, unable to get in her bath tub due to pain on right knee  Pain right now is worse on her right knee. She is getting help three days a week for a few hours to keep her house clean.    GERD: well controlled with Pepcid, no heartburn or indigestion, denies vomiting . Unchanged   Chronic constipation: she has Amitiza but only taking once a day and has bowel movements about twice a week but can go longer without a bowel movement , she went [facility] week with abdominal pain, diagnosied with constipation and also had an UTI, she  took meds and asked to be rechecked today . Constipation has improved   Atherosclerosis aorta: on statin therapy not on aspirin because of  high risk of falls .Last LDL was at goal at 64    Osteoporosis: seeing Dr. Honor Junes for Prolia and is tolerating it well. Off Alendronate   Hyperparathyroidism: seeing endo , monitored   Patient lives alone and is not interested in placement. Daughter lives in [place] and her son is a Administrator and is usually not in town  She brought her medications with her today   Patient Active Problem List   Diagnosis Date Noted   Closed compression fracture of body of L1 vertebra ([place]) 09/23/2020   Gastric polyp    Polyp of colon    Morbid obesity ([place]) 04/08/2018   Age-related osteoporosis without current pathological fracture 09/26/2017   Pain in both hands 09/23/2017   Hyperparathyroidism ([place]) 08/06/2017   Chronic kidney disease, stage III (moderate) ([place]) 08/06/2017   HLD (hyperlipidemia) 07/25/2017   Osteoporotic compression fracture of spine ([place]) 03/22/2017   Assistance needed with transportation 03/21/2017   Iron deficiency anemia 12/19/2016   Aortic atherosclerosis ([place]) 11/23/2016   Anterolisthesis 11/23/2016   Chronic pain syndrome 09/13/2016   Vitamin D deficiency 08/19/2016   Anemia, unspecified 08/19/2016  Primary open angle glaucoma (POAG) of both eyes, severe stage 08/17/2016   Hypertensive retinopathy 08/17/2016   Long term current use of opiate analgesic 07/19/2016   Dyslipidemia associated with type 2 diabetes mellitus ([place]) 04/16/2016   Chronic midline low back pain without sciatica 04/16/2016   Chronic pain of right knee 04/16/2016   Vaginal dryness 02/22/2015   Calculus of kidney 11/10/2014   Osteoarthritis, multiple sites 09/09/2014   Type 2 diabetes mellitus with microalbuminuria ([place]) 09/09/2014   Glaucoma 09/09/2014   Benign hypertension with CKD (chronic kidney disease) stage III ([place]) 09/09/2014   Chronic pain  09/09/2014   Tinea corporis 52/84/1324   Umbilical hernia 40/11/2723   Mass of right breast     Past Surgical History:  Procedure Laterality Date   ABDOMINAL HYSTERECTOMY  1958   menorrhagia   BACK SURGERY     BREAST BIOPSY Left 01/25/2012   BREAST BIOPSY Right 04/08/2019   hear clip, [place] bx, path pending   BREAST EXCISIONAL BIOPSY     BREAST LUMPECTOMY WITH NEEDLE LOCALIZATION Right 05/12/2019   Procedure: BREAST LUMPECTOMY WITH NEEDLE LOCALIZATION;  Surgeon: Olean Ree, MD;  Location: [facility] ORS;  Service: General;  Laterality: Right;   CATARACT EXTRACTION, BILATERAL Bilateral    1st 06/07/15 and the 2nd 06/21/15   CHOLECYSTECTOMY     COLONOSCOPY  2003   [facility]   COLONOSCOPY WITH PROPOFOL N/A 07/02/2019   Procedure: COLONOSCOPY WITH PROPOFOL;  Surgeon: Virgel Manifold, MD;  Location: [facility] ENDOSCOPY;  Service: Endoscopy;  Laterality: N/A;   COLONOSCOPY WITH PROPOFOL N/A 07/16/2019   Procedure: COLONOSCOPY WITH PROPOFOL;  Surgeon: Virgel Manifold, MD;  Location: [facility] ENDOSCOPY;  Service: Endoscopy;  Laterality: N/A;   ESOPHAGOGASTRODUODENOSCOPY (EGD) WITH PROPOFOL N/A 07/02/2019   Procedure: ESOPHAGOGASTRODUODENOSCOPY (EGD) WITH PROPOFOL;  Surgeon: Virgel Manifold, MD;  Location: [facility] ENDOSCOPY;  Service: Endoscopy;  Laterality: N/A;   EYE SURGERY     KNEE SURGERY  2009,2011,2012   twice on right and once on left   KNEE SURGERY     LIPOMA EXCISION  1998   SPINE SURGERY  2004   TONSILLECTOMY     age of 60    Family History  Problem Relation Age of Onset   Cancer Mother        skin cancer   Stroke Mother    Heart disease Father    Breast cancer Neg Hx     Social History   Tobacco Use   Smoking status: Never   Smokeless tobacco: Never  Substance Use Topics   Alcohol use: No    Alcohol/week: 0.0 standard drinks     Current Outpatient Medications:    acetaminophen (TYLENOL) 500 MG tablet, Take 1 tablet (500 mg total) by mouth 3 (three) times daily. (Patient  taking differently: Take 500 mg by mouth every 6 (six) hours as needed for moderate pain or headache.), Disp: 90 tablet, Rfl: 0   acetaminophen (TYLENOL) 500 MG tablet, Take by mouth., Disp: , Rfl:    amLODipine (NORVASC) 5 MG tablet, Take 1 tablet (5 mg total) by mouth daily., Disp: 90 tablet, Rfl: 1   brimonidine-timolol (COMBIGAN) 0.2-0.5 % ophthalmic solution, Place 1 drop into both eyes 2 (two) times daily. , Disp: , Rfl:    Calcium 250 MG CAPS, Take by mouth., Disp: , Rfl:    Calcium Carb-Cholecalciferol 500-10 MG-MCG TABS, Take by mouth., Disp: , Rfl:    Calcium Carbonate-Vitamin D (OYSTER SHELL CALCIUM 500 + D PO), Take 1  tablet by mouth 2 (two) times a day., Disp: , Rfl:    cephALEXin (KEFLEX) 250 MG capsule, Take 1 capsule (250 mg total) by mouth 3 (three) times daily., Disp: 21 capsule, Rfl: 0   denosumab (PROLIA) 60 MG/ML SOSY injection, Inject into the skin., Disp: , Rfl:    Docusate Sodium 100 MG capsule, Take 100 mg by mouth 2 (two) times daily., Disp: , Rfl:    dorzolamide (TRUSOPT) 2 % ophthalmic solution, 1 drop 2 (two) times daily., Disp: , Rfl:    famotidine (PEPCID) 20 MG tablet, TAKE 1 TABLET BY MOUTH EVERY DAY, Disp: 90 tablet, Rfl: 1   ferrous sulfate 325 (65 FE) MG tablet, TAKE 1 TABLET BY MOUTH EVERY DAY WITH BREAKFAST, Disp: 90 tablet, Rfl: 1   fluticasone (FLONASE) 50 MCG/ACT nasal spray, SPRAY 2 SPRAYS INTO EACH NOSTRIL EVERY DAY, Disp: 48 mL, Rfl: 1   lactulose (CHRONULAC) 10 GM/15ML solution, Take 30 mLs (20 g total) by mouth daily as needed for mild constipation., Disp: 120 mL, Rfl: 0   Lancets (ONETOUCH DELICA PLUS [id number]) MISC, 1 each by Does not apply route daily., Disp: 100 each, Rfl: 2   lubiprostone (AMITIZA) 24 MCG capsule, TAKE 1 CAPSULE (24 MCG TOTAL) BY MOUTH 2 (TWO) TIMES DAILY WITH A MEAL., Disp: 180 capsule, Rfl: 1   metFORMIN (GLUCOPHAGE-XR) 750 MG 24 hr tablet, TAKE 2 TABLETS (1,500 MG TOTAL) BY MOUTH DAILY WITH BREAKFAST., Disp: 180 tablet, Rfl: 1    metFORMIN (GLUCOPHAGE-XR) 750 MG 24 hr tablet, Take by mouth., Disp: , Rfl:    olmesartan (BENICAR) 20 MG tablet, Take 1 tablet (20 mg total) by mouth daily., Disp: 90 tablet, Rfl: 1   omega-3 acid ethyl esters (LOVAZA) 1 g capsule, TAKE 1 CAPSULE BY MOUTH TWICE A DAY, Disp: 180 capsule, Rfl: 1   ONETOUCH ULTRA test strip, USE AS DIRECTED, Disp: 100 strip, Rfl: 12   oxyCODONE-acetaminophen (PERCOCET) 7.5-325 MG tablet, Take 1 tablet by mouth every 8 (eight) hours as needed for moderate pain or severe pain., Disp: 90 tablet, Rfl: 0   [START ON 04/13/2021] oxyCODONE-acetaminophen (PERCOCET) 7.5-325 MG tablet, Take 1 tablet by mouth every 8 (eight) hours as needed for moderate pain or severe pain., Disp: 90 tablet, Rfl: 0   pravastatin (PRAVACHOL) 40 MG tablet, TAKE 1 TABLET BY MOUTH EVERYDAY AT BEDTIME, Disp: 90 tablet, Rfl: 0   ROCKLATAN 0.02-0.005 % SOLN, Apply 1 drop to eye at bedtime., Disp: , Rfl:    Vitamin D, Ergocalciferol, (DRISDOL) 1.25 MG (50000 UNIT) CAPS capsule, Take 50,000 Units by mouth every 7 (seven) days., Disp: , Rfl:   Allergies  Allergen Reactions   Ace Inhibitors Cough    I personally reviewed active problem list, medication list, allergies, family history, social history, health maintenance with the patient/caregiver today.   ROS  ***  Objective  There were no vitals filed for this visit.  There is no height or weight on file to calculate BMI.  Physical Exam ***  No results found for this or any previous visit (from the past 2160 hour(s)).  Diabetic Foot Exam: Diabetic Foot Exam - Simple   No data filed    ***  PHQ2/9: Depression screen [facility] 2/9 12/16/2020 10/10/2020 09/23/2020 08/17/2020 05/05/2020  Decreased Interest 0 0 1 0 0  Down, Depressed, Hopeless 0 0 1 0 0  PHQ - 2 Score 0 0 2 0 0  Altered sleeping - - 0 - -  Tired, decreased energy - - 1 - -  Change in appetite - - 1 - -  Feeling bad or failure about yourself  - - 1 - -  Trouble concentrating  - - 0 - -  Moving slowly or fidgety/restless - - 3 - -  Suicidal thoughts - - 0 - -  PHQ-9 Score - - 8 - -  Difficult doing work/chores - - Somewhat difficult - -  Some recent data might be hidden    phq 9 is {gen pos [id number]}   Fall Risk: Fall Risk  12/16/2020 09/23/2020 08/17/2020 05/05/2020 04/18/2020  Falls in the past year? 1 1 0 0 0  Comment - - - - -  Number falls in past yr: 0 0 0 0 0  Injury with Fall? 0 1 0 0 0  Risk Factor Category  - - - - -  Risk for fall due to : Impaired balance/gait - - Impaired balance/gait;Impaired mobility;Impaired vision -  Follow up Falls prevention discussed - - Falls prevention discussed -      Functional Status Survey:      Assessment & Plan  *** There are no diagnoses linked to this encounter.

## 2021-03-21 ENCOUNTER — Ambulatory Visit: Payer: Medicare Other | Admitting: Family Medicine

## 2021-03-23 ENCOUNTER — Encounter: Payer: Self-pay | Admitting: Anesthesiology

## 2021-03-23 ENCOUNTER — Ambulatory Visit: Payer: Medicare Other | Attending: Anesthesiology | Admitting: Anesthesiology

## 2021-03-23 ENCOUNTER — Other Ambulatory Visit: Payer: Self-pay

## 2021-03-23 DIAGNOSIS — Z79891 Long term (current) use of opiate analgesic: Secondary | ICD-10-CM | POA: Diagnosis not present

## 2021-03-23 DIAGNOSIS — G894 Chronic pain syndrome: Secondary | ICD-10-CM

## 2021-03-23 DIAGNOSIS — M25562 Pain in left knee: Secondary | ICD-10-CM

## 2021-03-23 DIAGNOSIS — M546 Pain in thoracic spine: Secondary | ICD-10-CM

## 2021-03-23 DIAGNOSIS — M25561 Pain in right knee: Secondary | ICD-10-CM | POA: Diagnosis not present

## 2021-03-23 DIAGNOSIS — M79641 Pain in right hand: Secondary | ICD-10-CM

## 2021-03-23 DIAGNOSIS — M79642 Pain in left hand: Secondary | ICD-10-CM

## 2021-03-23 DIAGNOSIS — M545 Low back pain, unspecified: Secondary | ICD-10-CM

## 2021-03-23 DIAGNOSIS — G8929 Other chronic pain: Secondary | ICD-10-CM

## 2021-03-23 DIAGNOSIS — M159 Polyosteoarthritis, unspecified: Secondary | ICD-10-CM

## 2021-03-23 NOTE — Progress Notes (Signed)
Virtual Visit via Telephone Note  I connected with Taylor [name] on 03/23/21 at  1:20 PM EST by telephone and verified that I am speaking with the correct person using two identifiers.  Location: Patient: Home Provider: Pain control center   I discussed the limitations, risks, security and privacy concerns of performing an evaluation and management service by telephone and the availability of in person appointments. I also discussed with the patient that there may be a patient responsible charge related to this service. The patient expressed understanding and agreed to proceed.   History of Present Illness: I spoke with Taylor [name] via telephone as she was unable to do the video portion of the conference and she states that she is doing reasonably well.  Her back pain has been stable.  Sciatica has been under control.  She is trying to stay active walking occasionally and doing some stretching exercises as tolerated.  She takes her opioid medications 3 times a day and these continue to give her good relief.  She continues to get good functional lifestyle improvement with the medicines and no reported side effects.  Otherwise she is in her usual state of health.  Lower extremity strength and function have been stable the quality characteristic and distribution of the back pain of been stable and no other changes are reported today.  Review of systems: General: No fevers or chills Pulmonary: No shortness of breath or dyspnea Cardiac: No angina or palpitations or lightheadedness GI: No abdominal pain or constipation Psych: No depression    Observations/Objective:  Current Outpatient Medications:    acetaminophen (TYLENOL) 500 MG tablet, Take 1 tablet (500 mg total) by mouth 3 (three) times daily. (Patient taking differently: Take 500 mg by mouth every 6 (six) hours as needed for moderate pain or headache.), Disp: 90 tablet, Rfl: 0   acetaminophen (TYLENOL) 500 MG tablet, Take by mouth., Disp:  , Rfl:    amLODipine (NORVASC) 5 MG tablet, Take 1 tablet (5 mg total) by mouth daily., Disp: 90 tablet, Rfl: 1   brimonidine-timolol (COMBIGAN) 0.2-0.5 % ophthalmic solution, Place 1 drop into both eyes 2 (two) times daily. , Disp: , Rfl:    Calcium 250 MG CAPS, Take by mouth., Disp: , Rfl:    Calcium Carb-Cholecalciferol 500-10 MG-MCG TABS, Take by mouth., Disp: , Rfl:    Calcium Carbonate-Vitamin D (OYSTER SHELL CALCIUM 500 + D PO), Take 1 tablet by mouth 2 (two) times a day., Disp: , Rfl:    cephALEXin (KEFLEX) 250 MG capsule, Take 1 capsule (250 mg total) by mouth 3 (three) times daily., Disp: 21 capsule, Rfl: 0   denosumab (PROLIA) 60 MG/ML SOSY injection, Inject into the skin., Disp: , Rfl:    Docusate Sodium 100 MG capsule, Take 100 mg by mouth 2 (two) times daily., Disp: , Rfl:    dorzolamide (TRUSOPT) 2 % ophthalmic solution, 1 drop 2 (two) times daily., Disp: , Rfl:    famotidine (PEPCID) 20 MG tablet, TAKE 1 TABLET BY MOUTH EVERY DAY, Disp: 90 tablet, Rfl: 1   ferrous sulfate 325 (65 FE) MG tablet, TAKE 1 TABLET BY MOUTH EVERY DAY WITH BREAKFAST, Disp: 90 tablet, Rfl: 1   fluticasone (FLONASE) 50 MCG/ACT nasal spray, SPRAY 2 SPRAYS INTO EACH NOSTRIL EVERY DAY, Disp: 48 mL, Rfl: 1   lactulose (CHRONULAC) 10 GM/15ML solution, Take 30 mLs (20 g total) by mouth daily as needed for mild constipation., Disp: 120 mL, Rfl: 0   Lancets (ONETOUCH DELICA PLUS  LANCET30G) MISC, 1 each by Does not apply route daily., Disp: 100 each, Rfl: 2   lubiprostone (AMITIZA) 24 MCG capsule, TAKE 1 CAPSULE (24 MCG TOTAL) BY MOUTH 2 (TWO) TIMES DAILY WITH A MEAL., Disp: 180 capsule, Rfl: 1   metFORMIN (GLUCOPHAGE-XR) 750 MG 24 hr tablet, TAKE 2 TABLETS (1,500 MG TOTAL) BY MOUTH DAILY WITH BREAKFAST., Disp: 180 tablet, Rfl: 1   metFORMIN (GLUCOPHAGE-XR) 750 MG 24 hr tablet, Take by mouth., Disp: , Rfl:    olmesartan (BENICAR) 20 MG tablet, Take 1 tablet (20 mg total) by mouth daily., Disp: 90 tablet, Rfl: 1    omega-3 acid ethyl esters (LOVAZA) 1 g capsule, TAKE 1 CAPSULE BY MOUTH TWICE A DAY, Disp: 180 capsule, Rfl: 1   ONETOUCH ULTRA test strip, USE AS DIRECTED, Disp: 100 strip, Rfl: 12   oxyCODONE-acetaminophen (PERCOCET) 7.5-325 MG tablet, Take 1 tablet by mouth every 8 (eight) hours as needed for moderate pain or severe pain., Disp: 90 tablet, Rfl: 0   [START ON 04/13/2021] oxyCODONE-acetaminophen (PERCOCET) 7.5-325 MG tablet, Take 1 tablet by mouth every 8 (eight) hours as needed for moderate pain or severe pain., Disp: 90 tablet, Rfl: 0   pravastatin (PRAVACHOL) 40 MG tablet, TAKE 1 TABLET BY MOUTH EVERYDAY AT BEDTIME, Disp: 90 tablet, Rfl: 0   ROCKLATAN 0.02-0.005 % SOLN, Apply 1 drop to eye at bedtime., Disp: , Rfl:    Vitamin D, Ergocalciferol, (DRISDOL) 1.25 MG (50000 UNIT) CAPS capsule, Take 50,000 Units by mouth every 7 (seven) days., Disp: , Rfl:    Past Medical History:  Diagnosis Date   Abnormal mammogram, unspecified 2013   Anemia    Arthritis    Breast screening, unspecified 2013   Diabetes mellitus without complication ([place]) 8099   non insulin dependent   Dysrhythmia    IRREGULAR HEART BEAT   GERD (gastroesophageal reflux disease)    Glaucoma 2003   Gout    History of kidney stones    H/O   Hyperlipidemia 2008   Hypertension 1980's   Lump or mass in breast 01/03/2012   left breast   Obesity, unspecified 2013   Osteoporosis    Shingles 2013   Special screening for malignant neoplasms, colon 2013     Assessment and Plan: 1. Chronic pain syndrome   2. Long term prescription opiate use   3. Osteoarthritis of multiple joints, unspecified osteoarthritis type   4. Chronic pain of right knee   5. Chronic midline low back pain without sciatica   6. Acute left-sided thoracic back pain   7. Pain in both hands   8. Chronic pain of both knees   Based on our discussion today and upon review of the [facility] practitioner database information I think is appropriate for  refill however Percocet.  This to be dated for January 24 and February 23.  We will have her return to clinic in approximately 6 to 7 weeks for routine follow-up and her annual eyes drug screen.  No other changes in her pharmacologic regimen will be initiated at this time.  I encouraged her to continue her stretching strengthening exercises as she is now doing and continue follow-up with her primary care physicians for baseline medical care.  Follow Up Instructions:    I discussed the assessment and treatment plan with the patient. The patient was provided an opportunity to ask questions and all were answered. The patient agreed with the plan and demonstrated an understanding of the instructions.   The patient  was advised to call back or seek an in-person evaluation if the symptoms worsen or if the condition fails to improve as anticipated.  I provided 30 minutes of non-face-to-face time during this encounter.   [name], MD

## 2021-03-24 ENCOUNTER — Encounter: Payer: Self-pay | Admitting: Family Medicine

## 2021-03-24 ENCOUNTER — Ambulatory Visit (INDEPENDENT_AMBULATORY_CARE_PROVIDER_SITE_OTHER): Payer: Medicare Other | Admitting: Family Medicine

## 2021-03-24 VITALS — BP 134/78 | HR 79 | Resp 16 | Wt 192.0 lb

## 2021-03-24 DIAGNOSIS — I152 Hypertension secondary to endocrine disorders: Secondary | ICD-10-CM

## 2021-03-24 DIAGNOSIS — I700 Atherosclerosis of aorta: Secondary | ICD-10-CM

## 2021-03-24 DIAGNOSIS — M4800 Spinal stenosis, site unspecified: Secondary | ICD-10-CM

## 2021-03-24 DIAGNOSIS — E785 Hyperlipidemia, unspecified: Secondary | ICD-10-CM

## 2021-03-24 DIAGNOSIS — N1831 Chronic kidney disease, stage 3a: Secondary | ICD-10-CM | POA: Diagnosis not present

## 2021-03-24 DIAGNOSIS — I10 Essential (primary) hypertension: Secondary | ICD-10-CM

## 2021-03-24 DIAGNOSIS — E1169 Type 2 diabetes mellitus with other specified complication: Secondary | ICD-10-CM | POA: Diagnosis not present

## 2021-03-24 DIAGNOSIS — E1159 Type 2 diabetes mellitus with other circulatory complications: Secondary | ICD-10-CM | POA: Diagnosis not present

## 2021-03-24 DIAGNOSIS — K219 Gastro-esophageal reflux disease without esophagitis: Secondary | ICD-10-CM

## 2021-03-24 DIAGNOSIS — M810 Age-related osteoporosis without current pathological fracture: Secondary | ICD-10-CM

## 2021-03-24 DIAGNOSIS — M159 Polyosteoarthritis, unspecified: Secondary | ICD-10-CM

## 2021-03-24 DIAGNOSIS — K5909 Other constipation: Secondary | ICD-10-CM

## 2021-03-24 DIAGNOSIS — E782 Mixed hyperlipidemia: Secondary | ICD-10-CM

## 2021-03-24 LAB — POCT GLYCOSYLATED HEMOGLOBIN (HGB A1C): Hemoglobin A1C: 6.4 % — AB (ref 4.0–5.6)

## 2021-03-24 MED ORDER — LACTULOSE 10 GM/15ML PO SOLN: 20.0000 g | Freq: Every day | ORAL | 0 refills | Status: DC | PRN

## 2021-03-24 NOTE — Progress Notes (Signed)
Name: Taylor [name]   MRN: [id number]    DOB: 1932-12-04   Date:03/24/2021       Progress Note  Subjective  Chief Complaint  Follow Up  HPI  DM: she forgot to bring her sugar logs, she is taking metformin 750 mg daily, today A 1 C is down from 6.9 % to 6.4 %   She denies polyphagia, polydipsia or polyuria. . She has a history of microalbuminuria and has been taking  Benicar 20 mg  She has CKI and is under the care of nephrologist. She takes Lovaza and statin for dyslipidemia and denies side effects. She also has associated Obesity, HTN. No hypoglycemia    Morbid obesity: BMI  above 35 with DM, HTN, dyslipidemia, GERD, she has pain and difficulty standing up to prepare meals. Weight continues to go up, explained needs to cut down on portion size    HTN with CKI stage III: also has secondary hyperparathyroidism, last Pth was 93 back in 09/22  under the care of nephrologist, Dr. Candiss Norse. No pruritis noticed, BP last visit was lower than usual so we advised her to take Norvasc in the pm's starting tomorrow and olmasartan in ams' and today bp is at goal for her    Chronic pain: she has chronic right knee pain, under the care of pain clinic and uses a walker to assist with ambulation . She also has daily back pain and feels weak on her back, she is under the care of Dr. Andree Elk , she states she has difficulty taking care of her house, preparing meals, unable to get in her bath tub due to pain on right knee She is getting help three days a week for a few hours to keep her house clean. She states always in pain but stable.    GERD: well controlled with Pepcid, no heartburn or indigestion, denies vomiting . Unchanged   Chronic constipation: she only has bowel movements once every 2 weeks. She has been taking lactulose prn only but needs a refill   Atherosclerosis aorta: on statin therapy not on aspirin because of  high risk of falls . We will recheck labs today    Osteoporosis: seeing Dr. Honor Junes for  Prolia and tolerated it well, but missed dose from Nov 22 we will contact his office to schedule it. She has been off Alendronate Tolerating it well.   Patient lives alone and is not interested in placement. Daughter lives in [place] and her son is a Administrator and is usually not in town  She has hearing loss   She forgot to bring her medications today  Patient Active Problem List   Diagnosis Date Noted   Closed compression fracture of body of L1 vertebra ([place]) 09/23/2020   Gastric polyp    Polyp of colon    Morbid obesity ([place]) 04/08/2018   Age-related osteoporosis without current pathological fracture 09/26/2017   Pain in both hands 09/23/2017   Hyperparathyroidism ([place]) 08/06/2017   Chronic kidney disease, stage III (moderate) ([place]) 08/06/2017   HLD (hyperlipidemia) 07/25/2017   Osteoporotic compression fracture of spine ([place]) 03/22/2017   Assistance needed with transportation 03/21/2017   Iron deficiency anemia 12/19/2016   Aortic atherosclerosis ([place]) 11/23/2016   Anterolisthesis 11/23/2016   Chronic pain syndrome 09/13/2016   Vitamin D deficiency 08/19/2016   Anemia, unspecified 08/19/2016   Primary open angle glaucoma (POAG) of both eyes, severe stage 08/17/2016   Hypertensive retinopathy 08/17/2016   Long term current use of opiate  analgesic 07/19/2016   Dyslipidemia associated with type 2 diabetes mellitus ([place]) 04/16/2016   Chronic midline low back pain without sciatica 04/16/2016   Chronic pain of right knee 04/16/2016   Vaginal dryness 02/22/2015   Calculus of kidney 11/10/2014   Osteoarthritis, multiple sites 09/09/2014   Type 2 diabetes mellitus with microalbuminuria ([place]) 09/09/2014   Glaucoma 09/09/2014   Benign hypertension with CKD (chronic kidney disease) stage III ([place]) 09/09/2014   Chronic pain 09/09/2014   Tinea corporis 05/39/7673   Umbilical hernia 41/93/7902   Mass of right breast     Past Surgical History:  Procedure Laterality Date    ABDOMINAL HYSTERECTOMY  1958   menorrhagia   BACK SURGERY     BREAST BIOPSY Left 01/25/2012   BREAST BIOPSY Right 04/08/2019   hear clip, [place] bx, path pending   BREAST EXCISIONAL BIOPSY     BREAST LUMPECTOMY WITH NEEDLE LOCALIZATION Right 05/12/2019   Procedure: BREAST LUMPECTOMY WITH NEEDLE LOCALIZATION;  Surgeon: Olean Ree, MD;  Location: [facility];  Service: General;  Laterality: Right;   CATARACT EXTRACTION, BILATERAL Bilateral    1st 06/07/15 and the 2nd 06/21/15   CHOLECYSTECTOMY     COLONOSCOPY  2003   [facility]   COLONOSCOPY WITH PROPOFOL N/A 07/02/2019   Procedure: COLONOSCOPY WITH PROPOFOL;  Surgeon: Virgel Manifold, MD;  Location: [facility];  Service: Endoscopy;  Laterality: N/A;   COLONOSCOPY WITH PROPOFOL N/A 07/16/2019   Procedure: COLONOSCOPY WITH PROPOFOL;  Surgeon: Virgel Manifold, MD;  Location: [facility];  Service: Endoscopy;  Laterality: N/A;   ESOPHAGOGASTRODUODENOSCOPY (EGD) WITH PROPOFOL N/A 07/02/2019   Procedure: ESOPHAGOGASTRODUODENOSCOPY (EGD) WITH PROPOFOL;  Surgeon: Virgel Manifold, MD;  Location: [facility];  Service: Endoscopy;  Laterality: N/A;   EYE SURGERY     KNEE SURGERY  2009,2011,2012   twice on right and once on left   KNEE SURGERY     LIPOMA EXCISION  1998   SPINE SURGERY  2004   TONSILLECTOMY     age of 75    Family History  Problem Relation Age of Onset   Cancer Mother        skin cancer   Stroke Mother    Heart disease Father    Breast cancer Neg Hx     Social History   Tobacco Use   Smoking status: Never   Smokeless tobacco: Never  Substance Use Topics   Alcohol use: No    Alcohol/week: 0.0 standard drinks     Current Outpatient Medications:    acetaminophen (TYLENOL) 500 MG tablet, Take 1 tablet (500 mg total) by mouth 3 (three) times daily. (Patient taking differently: Take 500 mg by mouth every 6 (six) hours as needed for moderate pain or headache.), Disp: 90 tablet, Rfl: 0   acetaminophen (TYLENOL)  500 MG tablet, Take by mouth., Disp: , Rfl:    amLODipine (NORVASC) 5 MG tablet, Take 1 tablet (5 mg total) by mouth daily., Disp: 90 tablet, Rfl: 1   brimonidine-timolol (COMBIGAN) 0.2-0.5 % ophthalmic solution, Place 1 drop into both eyes 2 (two) times daily. , Disp: , Rfl:    Calcium 250 MG CAPS, Take by mouth., Disp: , Rfl:    Calcium Carb-Cholecalciferol 500-10 MG-MCG TABS, Take by mouth., Disp: , Rfl:    Calcium Carbonate-Vitamin D (OYSTER SHELL CALCIUM 500 + D PO), Take 1 tablet by mouth 2 (two) times a day., Disp: , Rfl:    cephALEXin (KEFLEX) 250 MG capsule, Take 1 capsule (250 mg  total) by mouth 3 (three) times daily., Disp: 21 capsule, Rfl: 0   denosumab (PROLIA) 60 MG/ML SOSY injection, Inject into the skin., Disp: , Rfl:    Docusate Sodium 100 MG capsule, Take 100 mg by mouth 2 (two) times daily., Disp: , Rfl:    dorzolamide (TRUSOPT) 2 % ophthalmic solution, 1 drop 2 (two) times daily., Disp: , Rfl:    famotidine (PEPCID) 20 MG tablet, TAKE 1 TABLET BY MOUTH EVERY DAY, Disp: 90 tablet, Rfl: 1   ferrous sulfate 325 (65 FE) MG tablet, TAKE 1 TABLET BY MOUTH EVERY DAY WITH BREAKFAST, Disp: 90 tablet, Rfl: 1   fluticasone (FLONASE) 50 MCG/ACT nasal spray, SPRAY 2 SPRAYS INTO EACH NOSTRIL EVERY DAY, Disp: 48 mL, Rfl: 1   lactulose (CHRONULAC) 10 GM/15ML solution, Take 30 mLs (20 g total) by mouth daily as needed for mild constipation., Disp: 120 mL, Rfl: 0   Lancets (ONETOUCH DELICA PLUS [id number]) MISC, 1 each by Does not apply route daily., Disp: 100 each, Rfl: 2   lubiprostone (AMITIZA) 24 MCG capsule, TAKE 1 CAPSULE (24 MCG TOTAL) BY MOUTH 2 (TWO) TIMES DAILY WITH A MEAL., Disp: 180 capsule, Rfl: 1   metFORMIN (GLUCOPHAGE-XR) 750 MG 24 hr tablet, TAKE 2 TABLETS (1,500 MG TOTAL) BY MOUTH DAILY WITH BREAKFAST., Disp: 180 tablet, Rfl: 1   metFORMIN (GLUCOPHAGE-XR) 750 MG 24 hr tablet, Take by mouth., Disp: , Rfl:    olmesartan (BENICAR) 20 MG tablet, Take 1 tablet (20 mg total) by mouth  daily., Disp: 90 tablet, Rfl: 1   omega-3 acid ethyl esters (LOVAZA) 1 g capsule, TAKE 1 CAPSULE BY MOUTH TWICE A DAY, Disp: 180 capsule, Rfl: 1   ONETOUCH ULTRA test strip, USE AS DIRECTED, Disp: 100 strip, Rfl: 12   oxyCODONE-acetaminophen (PERCOCET) 7.5-325 MG tablet, Take 1 tablet by mouth every 8 (eight) hours as needed for moderate pain or severe pain., Disp: 90 tablet, Rfl: 0   [START ON 04/13/2021] oxyCODONE-acetaminophen (PERCOCET) 7.5-325 MG tablet, Take 1 tablet by mouth every 8 (eight) hours as needed for moderate pain or severe pain., Disp: 90 tablet, Rfl: 0   pravastatin (PRAVACHOL) 40 MG tablet, TAKE 1 TABLET BY MOUTH EVERYDAY AT BEDTIME, Disp: 90 tablet, Rfl: 0   ROCKLATAN 0.02-0.005 % SOLN, Apply 1 drop to eye at bedtime., Disp: , Rfl:    Vitamin D, Ergocalciferol, (DRISDOL) 1.25 MG (50000 UNIT) CAPS capsule, Take 50,000 Units by mouth every 7 (seven) days., Disp: , Rfl:   Allergies  Allergen Reactions   Ace Inhibitors Cough    I personally reviewed active problem list, medication list, allergies, family history, social history, health maintenance with the patient/caregiver today.   ROS  Constitutional: Negative for fever, positive for  weight change.  Respiratory: Negative for cough and shortness of breath.   Cardiovascular: Negative for chest pain or palpitations.  Gastrointestinal: Negative for abdominal pain, no bowel changes.  Musculoskeletal: Negative for gait problem or joint swelling.  Skin: Negative for rash.  Neurological: Negative for dizziness or headache.  No other specific complaints in a complete review of systems (except as listed in HPI above).   Objective  Vitals:   03/24/21 1409  BP: 134/78  Pulse: 79  Resp: 16  SpO2: 99%  Weight: 192 lb (87.1 kg)    Body mass index is 37.5 kg/m.  Physical Exam  Constitutional: Patient appears well-developed Obese  No distress.  HEENT: head atraumatic, normocephalic, pupils equal and reactive to light,  neck supple Cardiovascular: Normal  rate, regular rhythm and normal heart sounds.  No murmur heard. No BLE edema. Pulmonary/Chest: Effort normal and breath sounds normal. No respiratory distress. Abdominal: Soft.  There is no tenderness. Psychiatric: Patient has a normal mood and affect. behavior is normal. Judgment and thought content normal.   Recent Results (from the past 2160 hour(s))  POCT HgB A1C     Status: Abnormal   Collection Time: 03/24/21  2:20 PM  Result Value Ref Range   Hemoglobin A1C 6.4 (A) 4.0 - 5.6 %   HbA1c POC (<> result, manual entry)     HbA1c, POC (prediabetic range)     HbA1c, POC (controlled diabetic range)        PHQ2/9: Depression screen [facility] 2/9 03/24/2021 12/16/2020 10/10/2020 09/23/2020 08/17/2020  Decreased Interest 0 0 0 1 0  Down, Depressed, Hopeless 0 0 0 1 0  PHQ - 2 Score 0 0 0 2 0  Altered sleeping 0 - - 0 -  Tired, decreased energy 0 - - 1 -  Change in appetite 0 - - 1 -  Feeling bad or failure about yourself  0 - - 1 -  Trouble concentrating 0 - - 0 -  Moving slowly or fidgety/restless 0 - - 3 -  Suicidal thoughts 0 - - 0 -  PHQ-9 Score 0 - - 8 -  Difficult doing work/chores - - - Somewhat difficult -  Some recent data might be hidden    phq 9 is negative   Fall Risk: Fall Risk  03/24/2021 12/16/2020 09/23/2020 08/17/2020 05/05/2020  Falls in the past year? 1 1 1  0 0  Comment - - - - -  Number falls in past yr: 0 0 0 0 0  Injury with Fall? 0 0 1 0 0  Risk Factor Category  - - - - -  Risk for fall due to : Impaired mobility;Impaired balance/gait Impaired balance/gait - - Impaired balance/gait;Impaired mobility;Impaired vision  Follow up Falls prevention discussed Falls prevention discussed - - Falls prevention discussed      Functional Status Survey: Is the patient deaf or have difficulty hearing?: Yes Does the patient have difficulty seeing, even when wearing glasses/contacts?: Yes Does the patient have difficulty concentrating,  remembering, or making decisions?: No Does the patient have difficulty walking or climbing stairs?: Yes Does the patient have difficulty dressing or bathing?: No Does the patient have difficulty doing errands alone such as visiting a doctor's office or shopping?: Yes    Assessment & Plan  1. Dyslipidemia associated with type 2 diabetes mellitus (HCC)  - POCT HgB A1C - Lipid panel - Microalbumin / creatinine urine ratio - COMPLETE METABOLIC PANEL WITH GFR  2. Stage 3a chronic kidney disease (HCC)  - Microalbumin / creatinine urine ratio - COMPLETE METABOLIC PANEL WITH GFR   1. Dyslipidemia associated with type 2 diabetes mellitus (HCC)  - POCT HgB A1C - Lipid panel - Microalbumin / creatinine urine ratio - COMPLETE METABOLIC PANEL WITH GFR  2. Stage 3a chronic kidney disease (HCC)  - Microalbumin / creatinine urine ratio - COMPLETE METABOLIC PANEL WITH GFR  3. Aortic atherosclerosis (HCC)  Recheck lipid panel   4. Hypertension associated with type 2 diabetes mellitus (HCC)  BP and diabetes under control   5. Morbid obesity ([place])  Discussed with the patient the risk posed by an increased BMI. Discussed importance of portion control, calorie counting and at least 150 minutes of physical activity weekly. Avoid sweet beverages and drink more water. Eat  at least 6 servings of fruit and vegetables daily    6. GERD without esophagitis   7. Age-related osteoporosis without current pathological fracture   8. Essential hypertension   9. Primary osteoarthritis involving multiple joints   10. Spinal stenosis, unspecified spinal region   11. Chronic constipation  - lactulose (CHRONULAC) 10 GM/15ML solution; Take 30 mLs (20 g total) by mouth daily as needed for mild constipation.  Dispense: 120 mL; Refill: 0  12. Mixed hyperlipidemia

## 2021-03-25 LAB — COMPLETE METABOLIC PANEL WITH GFR
AG Ratio: 1.8 (calc) (ref 1.0–2.5)
ALT: 5 U/L — ABNORMAL LOW (ref 6–29)
AST: 10 U/L (ref 10–35)
Albumin: 4.2 g/dL (ref 3.6–5.1)
Alkaline phosphatase (APISO): 46 U/L (ref 37–153)
BUN/Creatinine Ratio: 16 (calc) (ref 6–22)
BUN: 21 mg/dL (ref 7–25)
CO2: 24 mmol/L (ref 20–32)
Calcium: 9.7 mg/dL (ref 8.6–10.4)
Chloride: 110 mmol/L (ref 98–110)
Creat: 1.33 mg/dL — ABNORMAL HIGH (ref 0.60–0.95)
Globulin: 2.4 g/dL (calc) (ref 1.9–3.7)
Glucose, Bld: 120 mg/dL — ABNORMAL HIGH (ref 65–99)
Potassium: 4.5 mmol/L (ref 3.5–5.3)
Sodium: 142 mmol/L (ref 135–146)
Total Bilirubin: 0.5 mg/dL (ref 0.2–1.2)
Total Protein: 6.6 g/dL (ref 6.1–8.1)
eGFR: 39 mL/min/{1.73_m2} — ABNORMAL LOW (ref >=60)

## 2021-03-25 LAB — LIPID PANEL
Cholesterol: 120 mg/dL (ref <200)
HDL: 47 mg/dL — ABNORMAL LOW (ref >=50)
LDL Cholesterol (Calc): 53 mg/dL (calc)
Non-HDL Cholesterol (Calc): 73 mg/dL (calc) (ref <130)
Total CHOL/HDL Ratio: 2.6 (calc) (ref <5.0)
Triglycerides: 117 mg/dL (ref <150)

## 2021-03-25 LAB — MICROALBUMIN / CREATININE URINE RATIO
Creatinine, Urine: 99 mg/dL (ref 20–275)
Microalb Creat Ratio: 97 mcg/mg creat — ABNORMAL HIGH (ref <30)
Microalb, Ur: 9.6 mg/dL

## 2021-04-05 ENCOUNTER — Other Ambulatory Visit: Payer: Self-pay | Admitting: Family Medicine

## 2021-04-05 DIAGNOSIS — E1129 Type 2 diabetes mellitus with other diabetic kidney complication: Secondary | ICD-10-CM

## 2021-04-14 ENCOUNTER — Telehealth: Payer: Self-pay

## 2021-04-14 NOTE — Progress Notes (Signed)
Chronic Care Management Pharmacy Assistant   Name: Taylor [name]  MRN: [id number] DOB: 20-Sep-1932  Reason for Encounter: Hypertension Disease State Call   Recent office visits:  03/24/2021 Taylor Sizer, MD (PCP Office Visit) for Follow-up- No medication changes noted lab orders placed, patient to follow-up in 4 months  Recent consult visits:  03/23/2021 Taylor Hey, MD (Pain Management) for Chronic Pain- No medication changes noted, no orders placed, patient to follow-up in 6 weeks  03/08/2021 Taylor Ober, MD (Opthalmology) for Glaucoma Initial Eval- Combigan twice a day both eyes Rocklatan at beditme both eyes, Dorzolamide 2 times a day both eyes, no orders placed, no specific follow-up noted  Hospital visits:  None in previous 6 months  Medications: Outpatient Encounter Medications as of 04/14/2021  Medication Sig   acetaminophen (TYLENOL) 500 MG tablet Take 1 tablet (500 mg total) by mouth 3 (three) times daily. (Patient taking differently: Take 500 mg by mouth every 6 (six) hours as needed for moderate pain or headache.)   acetaminophen (TYLENOL) 500 MG tablet Take by mouth.   amLODipine (NORVASC) 5 MG tablet Take 1 tablet (5 mg total) by mouth daily.   brimonidine-timolol (COMBIGAN) 0.2-0.5 % ophthalmic solution Place 1 drop into both eyes 2 (two) times daily.    Calcium 250 MG CAPS Take by mouth.   Calcium Carb-Cholecalciferol 500-10 MG-MCG TABS Take by mouth.   Calcium Carbonate-Vitamin D (OYSTER SHELL CALCIUM 500 + D PO) Take 1 tablet by mouth 2 (two) times a day.   cephALEXin (KEFLEX) 250 MG capsule Take 1 capsule (250 mg total) by mouth 3 (three) times daily.   denosumab (PROLIA) 60 MG/ML SOSY injection Inject into the skin.   Docusate Sodium 100 MG capsule Take 100 mg by mouth 2 (two) times daily.   dorzolamide (TRUSOPT) 2 % ophthalmic solution 1 drop 2 (two) times daily.   famotidine (PEPCID) 20 MG tablet TAKE 1 TABLET BY MOUTH EVERY DAY   ferrous sulfate 325  (65 FE) MG tablet TAKE 1 TABLET BY MOUTH EVERY DAY WITH BREAKFAST   fluticasone (FLONASE) 50 MCG/ACT nasal spray SPRAY 2 SPRAYS INTO EACH NOSTRIL EVERY DAY   lactulose (CHRONULAC) 10 GM/15ML solution Take 30 mLs (20 g total) by mouth daily as needed for mild constipation.   Lancets (ONETOUCH DELICA PLUS [id number]) MISC 1 each by Does not apply route daily.   lubiprostone (AMITIZA) 24 MCG capsule TAKE 1 CAPSULE (24 MCG TOTAL) BY MOUTH 2 (TWO) TIMES DAILY WITH A MEAL.   metFORMIN (GLUCOPHAGE-XR) 750 MG 24 hr tablet TAKE 2 TABLETS (1,500 MG TOTAL) BY MOUTH DAILY WITH BREAKFAST.   olmesartan (BENICAR) 20 MG tablet Take 1 tablet (20 mg total) by mouth daily.   omega-3 acid ethyl esters (LOVAZA) 1 g capsule TAKE 1 CAPSULE BY MOUTH TWICE A DAY   ONETOUCH ULTRA test strip USE AS DIRECTED   oxyCODONE-acetaminophen (PERCOCET) 7.5-325 MG tablet Take 1 tablet by mouth every 8 (eight) hours as needed for moderate pain or severe pain.   pravastatin (PRAVACHOL) 40 MG tablet TAKE 1 TABLET BY MOUTH EVERYDAY AT BEDTIME   ROCKLATAN 0.02-0.005 % SOLN Apply 1 drop to eye at bedtime.   Vitamin D, Ergocalciferol, (DRISDOL) 1.25 MG (50000 UNIT) CAPS capsule Take 50,000 Units by mouth every 7 (seven) days.   No facility-administered encounter medications on file as of 04/14/2021.   Care Gaps: Zoster Vaccine Diabetic Eye Exam - completed 03/08/2021 COVID-19 Vaccine Booster 3  Star Rating Drugs: Metformin 750 mg  last filled on 04/05/2021 for a 90-Day supply with [facility] Olmesartan 20 mg Last filled on 04/05/2021 for a 90-Day supply with [facility] Pravastatin 40 mg last filled on 02/18/2021 for a 90-Day supply with [facility]   Reviewed chart prior to disease state call. Spoke with patient regarding BP  Recent Office Vitals: BP Readings from Last 3 Encounters:  03/24/21 134/78  12/16/20 108/62  11/30/20 (!) 155/90   Pulse Readings from Last 3 Encounters:  03/24/21 79  12/16/20 77  11/30/20 96     Wt Readings from Last 3 Encounters:  03/24/21 192 lb (87.1 kg)  12/16/20 191 lb (86.6 kg)  11/29/20 180 lb (81.6 kg)     Kidney Function Lab Results  Component Value Date/Time   CREATININE 1.33 (H) 03/24/2021 03:02 PM   CREATININE 1.26 (H) 11/29/2020 10:17 PM   CREATININE 1.10 (H) 09/05/2020 07:48 PM   CREATININE 1.35 (H) 12/28/2019 02:00 PM   GFRNONAA 42 (L) 11/29/2020 10:17 PM   GFRNONAA 36 (L) 12/28/2019 02:00 PM   GFRAA 42 (L) 12/28/2019 02:00 PM    BMP Latest Ref Rng & Units 03/24/2021 11/29/2020 09/05/2020  Glucose 65 - 99 mg/dL 120(H) 134(H) 194(H)  BUN 7 - 25 mg/dL 21 26(H) 18  Creatinine 0.60 - 0.95 mg/dL 1.33(H) 1.26(H) 1.10(H)  BUN/Creat Ratio 6 - 22 (calc) 16 - -  Sodium 135 - 146 mmol/L 142 141 140  Potassium 3.5 - 5.3 mmol/L 4.5 4.3 3.6  Chloride 98 - 110 mmol/L 110 108 111  CO2 20 - 32 mmol/L 24 24 21(L)  Calcium 8.6 - 10.4 mg/dL 9.7 9.7 8.9    Current antihypertensive regimen:  Amlodipine 5 mg 1 tablet daily Olmesartan 20 mg 1 tablet daily  How often are you checking your Blood Pressure? 1-2x per week  Current home BP readings: Patient stated she can't exactly remember what he reading was it was 130/70 something, and she stated her highest systolic number has been in the 190's but comes down after she takes her medication.   What recent interventions/DTPs have been made by any provider to improve Blood Pressure control since last CPP Visit: None ID  Any recent hospitalizations or ED visits since last visit with CPP? No  What diet changes have been made to improve Blood Pressure Control?  Patient eat's a normal diet. She can't do much cooking, but she does try to watch her salt intake  What exercise is being done to improve your Blood Pressure Control?  Patient has issues with ambulation so she doesn't exercise. She has knee pain, and is currently using a walker. The patient stated she was getting assistance in the home to help with meals, cleaning, bathing  but she she has not had anyone come in several weeks. Patient stated that she went to visit her son in November and when she came back the PCA visited 1 maybe 2 times, and they never came back. She provided me with a number to call so I can check on it for her.  Adherence Review: Is the patient currently on ACE/ARB medication? Yes Does the patient have >5 day gap between last estimated fill dates? No  I contacted the number that the patient provided for [facility] but this is a [facility] and they discharged the patient. The patient is in need of a personal care assistant. She will need to speak with a social worker to see if they can assist her with getting some in home help.  I will let CPP know so he can speak with the social worker to have them contact the patient. The patient stated she is having a really hard time getting around and performing everyday things for herself and she is requesting some help finding someone to come to her home a few days a week for assistance. Patient is aware of her appointment with the CPP tomorrow, and is agreeable.    Patient has a scheduled telephone appointment with Junius Argyle, CPP on 04/19/2021 @ [place], CPA/CMA Catering manager Phone: [phone]

## 2021-04-19 ENCOUNTER — Ambulatory Visit (INDEPENDENT_AMBULATORY_CARE_PROVIDER_SITE_OTHER): Payer: Medicare Other

## 2021-04-19 DIAGNOSIS — I129 Hypertensive chronic kidney disease with stage 1 through stage 4 chronic kidney disease, or unspecified chronic kidney disease: Secondary | ICD-10-CM

## 2021-04-19 DIAGNOSIS — E785 Hyperlipidemia, unspecified: Secondary | ICD-10-CM

## 2021-04-19 NOTE — Progress Notes (Unsigned)
Chronic Care Management Pharmacy Note  04/19/2021 Name:  Taylor [name] MRN:  [id number] DOB:  August 30, 1932  Summary: Patient presents for CCM follow-up.    Recommendations/Changes made from today's visit: 03/24/21: Patient presented to Dr. Ancil Boozer for follow-up. eGFR 39, Microalbuminuria, A1c 6.4%.   Plan: HC to check blood pressure, adherence in one month CPP follow-up 6 months  Subjective: Taylor [name] is an 88 y.o. year old female who is a primary patient of [name], MD.  The CCM team was consulted for assistance with disease management and care coordination needs.    Engaged with patient by telephone for follow up visit in response to provider referral for pharmacy case management and/or care coordination services.   Consent to Services:  The patient was given information about Chronic Care Management services, agreed to services, and gave verbal consent prior to initiation of services.  Please see initial visit note for detailed documentation.   Patient Care Team: [name], MD as PCP - General (Family Medicine) Andree Elk Alvina Filbert, MD as Consulting Physician (Anesthesiology) [name], DPM as Consulting Physician (Podiatry) [name], MD (Nephrology) Virgel Manifold, MD (Inactive) as Consulting Physician (Gastroenterology) Germaine Pomfret, [facility] (Pharmacist) Vern Claude, [place] as Social Worker  Recent office visits: 09/23/20: Patient presented to Delsa Grana, PA-C for follow-up.  12/14/19: Patient presented to Dr. Ancil Boozer for follow-up.   Recent consult visits: 03/09/20: Video visit with Dr. Andree Elk (Pain Management) for follow-up.   Hospital visits: 09/05/20: Patient presented to ED after fall  Objective:  Lab Results  Component Value Date   CREATININE 1.33 (H) 03/24/2021   BUN 21 03/24/2021   GFRNONAA 42 (L) 11/29/2020   GFRAA 42 (L) 12/28/2019   NA 142 03/24/2021   K 4.5 03/24/2021   CALCIUM 9.7 03/24/2021   CO2 24 03/24/2021    Lab  Results  Component Value Date/Time   HGBA1C 6.4 (A) 03/24/2021 02:20 PM   HGBA1C 6.9 (A) 12/16/2020 02:13 PM   HGBA1C 6.5 04/17/2019 02:08 PM   HGBA1C 6.6 08/13/2018 02:51 PM   HGBA1C 7.8 (A) 08/05/2017 11:30 AM   MICROALBUR 9.6 03/24/2021 03:02 PM   MICROALBUR 1.8 04/17/2019 03:12 PM   MICROALBUR 50 08/05/2017 11:28 AM   MICROALBUR 100 12/12/2015 12:21 PM    Last diabetic Eye exam:  Lab Results  Component Value Date/Time   HMDIABEYEEXA No Retinopathy 06/03/2017 12:00 AM    Last diabetic Foot exam: No results found for: HMDIABFOOTEX   Lab Results  Component Value Date   CHOL 120 03/24/2021   HDL 47 (L) 03/24/2021   LDLCALC 53 03/24/2021   TRIG 117 03/24/2021   CHOLHDL 2.6 03/24/2021    Hepatic Function Latest Ref Rng & Units 03/24/2021 11/29/2020 12/28/2019  Total Protein 6.1 - 8.1 g/dL 6.6 7.1 6.5  Albumin 3.5 - 5.0 g/dL - 4.1 -  AST 10 - 35 U/L 10 11(L) 8(L)  ALT 6 - 29 U/L 5(L) 7 4(L)  Alk Phosphatase 38 - 126 U/L - 41 -  Total Bilirubin 0.2 - 1.2 mg/dL 0.5 0.5 0.3    Lab Results  Component Value Date/Time   TSH 0.63 04/17/2019 03:12 PM    CBC Latest Ref Rng & Units 11/29/2020 09/05/2020 04/17/2019  WBC 4.0 - 10.5 K/uL 11.8(H) 6.6 7.3  Hemoglobin 12.0 - 15.0 g/dL 13.0 12.4 12.1  Hematocrit 36.0 - 46.0 % 38.0 36.9 37.1  Platelets 150 - 400 K/uL 331 297 332    Lab Results  Component  Value Date/Time   VD25OH 29 (L) 08/05/2017 12:08 PM   VD25OH 16 (L) 08/14/2016 10:12 AM    Clinical ASCVD: Yes  The ASCVD Risk score (Arnett DK, et al., 2019) failed to calculate for the following reasons:   The 2019 ASCVD risk score is only valid for ages 14 to 39    Depression screen PHQ 2/9 03/24/2021 12/16/2020 10/10/2020  Decreased Interest 0 0 0  Down, Depressed, Hopeless 0 0 0  PHQ - 2 Score 0 0 0  Altered sleeping 0 - -  Tired, decreased energy 0 - -  Change in appetite 0 - -  Feeling bad or failure about yourself  0 - -  Trouble concentrating 0 - -  Moving slowly or  fidgety/restless 0 - -  Suicidal thoughts 0 - -  PHQ-9 Score 0 - -  Difficult doing work/chores - - -  Some recent data might be hidden    -Last DEXA Scan: 04/30/2017   T-Score femoral neck: -3.3  T-Score total hip: -1.9  T-Score lumbar spine: NA  T-Score forearm radius: -2.3  10-year probability of major osteoporotic fracture: NA  10-year probability of hip fracture: NA  Social History   Tobacco Use  Smoking Status Never  Smokeless Tobacco Never   BP Readings from Last 3 Encounters:  03/24/21 134/78  12/16/20 108/62  11/30/20 (!) 155/90   Pulse Readings from Last 3 Encounters:  03/24/21 79  12/16/20 77  11/30/20 96   Wt Readings from Last 3 Encounters:  03/24/21 192 lb (87.1 kg)  12/16/20 191 lb (86.6 kg)  11/29/20 180 lb (81.6 kg)    Assessment/Interventions: Review of patient past medical history, allergies, medications, health status, including review of consultants reports, laboratory and other test data, was performed as part of comprehensive evaluation and provision of chronic care management services.   SDOH:  (Social Determinants of Health) assessments and interventions performed: Yes     CCM Care Plan  Allergies  Allergen Reactions   Ace Inhibitors Cough    Medications Reviewed Today     Reviewed by [name], MD (Physician) on 03/24/21 at [place] List Status: <None>   Medication Order Taking? Sig Documenting Provider Last Dose Status Informant  acetaminophen (TYLENOL) 500 MG tablet [id number] Yes Take 1 tablet (500 mg total) by mouth 3 (three) times daily.  Patient taking differently: Take 500 mg by mouth every 6 (six) hours as needed for moderate pain or headache.   [name], MD Taking Active   acetaminophen (TYLENOL) 500 MG tablet [id number] Yes Take by mouth. [provider] Taking Active   amLODipine (NORVASC) 5 MG tablet [id number] Yes Take 1 tablet (5 mg total) by mouth daily. [name], MD Taking Active    brimonidine-timolol (COMBIGAN) 0.2-0.5 % ophthalmic solution [id number] Yes Place 1 drop into both eyes 2 (two) times daily.  [provider] Taking Active Self  Calcium 250 MG CAPS [id number] Yes Take by mouth. [provider] Taking Active   Calcium Carb-Cholecalciferol 500-10 MG-MCG TABS [id number] Yes Take by mouth. [provider] Taking Active   Calcium Carbonate-Vitamin D (OYSTER SHELL CALCIUM 500 + D PO) [id number] Yes Take 1 tablet by mouth 2 (two) times a day. [provider] Taking Active Self  cephALEXin (KEFLEX) 250 MG capsule [id number] Yes Take 1 capsule (250 mg total) by mouth 3 (three) times daily. Paulette Blanch, MD Taking Active   denosumab (PROLIA) 60 MG/ML SOSY injection [id number] Yes Inject into the skin.  [provider] Taking Active   Docusate Sodium 100 MG capsule [id number] Yes Take 100 mg by mouth 2 (two) times daily. [provider] Taking Active   dorzolamide (TRUSOPT) 2 % ophthalmic solution [id number] Yes 1 drop 2 (two) times daily. [provider] Taking Active   famotidine (PEPCID) 20 MG tablet [id number] Yes TAKE 1 TABLET BY MOUTH EVERY DAY [name], MD Taking Active   ferrous sulfate 325 (65 FE) MG tablet [id number] Yes TAKE 1 TABLET BY MOUTH EVERY DAY WITH [place], Drue Stager, MD Taking Active   fluticasone (FLONASE) 50 MCG/ACT nasal spray [id number] Yes SPRAY 2 SPRAYS INTO EACH NOSTRIL EVERY Eloise Harman, MD Taking Active   lactulose (CHRONULAC) 10 GM/15ML solution [id number]  Take 30 mLs (20 g total) by mouth daily as needed for mild constipation. [name], MD  Active   Lancets (ONETOUCH DELICA PLUS FTDDUK02R) [place] [id number] Yes 1 each by Does not apply route daily. [name], MD Taking Active   lubiprostone (AMITIZA) 24 MCG capsule [id number] Yes TAKE 1 CAPSULE (24 MCG TOTAL) BY MOUTH 2 (TWO) TIMES DAILY WITH A MEAL. [name], MD Taking Active   metFORMIN (GLUCOPHAGE-XR) 750  MG 24 hr tablet [id number] Yes TAKE 2 TABLETS (1,500 MG TOTAL) BY MOUTH DAILY WITH BREAKFAST. [name], MD Taking Active   metFORMIN (GLUCOPHAGE-XR) 750 MG 24 hr tablet [id number] Yes Take by mouth. [provider] Taking Active   olmesartan (BENICAR) 20 MG tablet [id number] Yes Take 1 tablet (20 mg total) by mouth daily. [name], MD Taking Active   omega-3 acid ethyl esters (LOVAZA) 1 g capsule [id number] Yes TAKE 1 CAPSULE BY MOUTH TWICE A Eloise Harman, MD Taking Active   [facility] ULTRA test strip [id number] Yes USE AS DIRECTED [name], MD Taking Active   oxyCODONE-acetaminophen (PERCOCET) 7.5-325 MG tablet [id number] Yes Take 1 tablet by mouth every 8 (eight) hours as needed for moderate pain or severe pain. [name], MD Taking Active   oxyCODONE-acetaminophen (PERCOCET) 7.5-325 MG tablet [id number] Yes Take 1 tablet by mouth every 8 (eight) hours as needed for moderate pain or severe pain. [name], MD Taking Active   pravastatin (PRAVACHOL) 40 MG tablet [id number] Yes TAKE 1 TABLET BY MOUTH EVERYDAY AT BEDTIME [name], MD Taking Active   ROCKLATAN 0.02-0.005 % SOLN [id number] Yes Apply 1 drop to eye at bedtime. [provider] Taking Active   Vitamin D, Ergocalciferol, (DRISDOL) 1.25 MG (50000 UNIT) CAPS capsule [id number] Yes Take 50,000 Units by mouth every 7 (seven) days. [provider] Taking Active   Med List Note Dewayne Shorter, RN 12/21/20 1539): UDS 05-11-19 MR 03-12-2021            Patient Active Problem List   Diagnosis Date Noted   Closed compression fracture of body of L1 vertebra ([place]) 09/23/2020   Gastric polyp    Polyp of colon    Morbid obesity ([place]) 04/08/2018   Age-related osteoporosis without current pathological fracture 09/26/2017   Pain in both hands 09/23/2017   Hyperparathyroidism ([place]) 08/06/2017   Chronic kidney disease, stage III (moderate) ([place]) 08/06/2017   HLD (hyperlipidemia) 07/25/2017    Osteoporotic compression fracture of spine ([place]) 03/22/2017   Assistance needed with transportation 03/21/2017   Iron deficiency anemia 12/19/2016   Aortic atherosclerosis ([place]) 11/23/2016   Anterolisthesis 11/23/2016   Chronic pain syndrome 09/13/2016   Vitamin D deficiency 08/19/2016   Anemia, unspecified 08/19/2016   Primary open angle glaucoma (POAG)  of both eyes, severe stage 08/17/2016   Hypertensive retinopathy 08/17/2016   Long term current use of opiate analgesic 07/19/2016   Dyslipidemia associated with type 2 diabetes mellitus ([place]) 04/16/2016   Chronic midline low back pain without sciatica 04/16/2016   Chronic pain of right knee 04/16/2016   Vaginal dryness 02/22/2015   Calculus of kidney 11/10/2014   Osteoarthritis, multiple sites 09/09/2014   Type 2 diabetes mellitus with microalbuminuria ([place]) 09/09/2014   Glaucoma 09/09/2014   Benign hypertension with CKD (chronic kidney disease) stage III ([place]) 09/09/2014   Chronic pain 09/09/2014   Tinea corporis 28/41/3244   Umbilical hernia 02/21/7251   Mass of right breast     Immunization History  Administered Date(s) Administered   Fluad Quad(high Dose 65+) 12/15/2018, 12/28/2019, 12/16/2020   Influenza, High Dose Seasonal PF 11/10/2014, 11/17/2015, 11/23/2016, 12/06/2017   Influenza-Unspecified 10/20/2012, 11/10/2014   Moderna Sars-Covid-2 Vaccination 11/12/2019, 12/10/2019   Pneumococcal Conjugate-13 05/12/2013, 08/05/2015   Tdap 10/19/2010    Conditions to be addressed/monitored:  Hypertension, Hyperlipidemia, Diabetes, Coronary Artery Disease, Chronic Kidney Disease, Osteoporosis and Osteoarthritis  There are no care plans that you recently modified to display for this patient.   Medication Assistance: None required.  Patient affirms current coverage meets needs.  Patient's preferred pharmacy is:  [id number] [place] [id number]: [phone]:  [phone]  Uses pill box? No Pt endorses 75% compliance  We discussed: Current pharmacy is preferred with insurance plan and patient is satisfied with pharmacy services Patient decided to: Continue current medication management strategy  Care Plan and Follow Up Patient Decision:  Patient agrees to Care Plan and Follow-up.  Plan: Telephone follow up appointment with care management team member scheduled for:  04/19/2021 at 3:45 PM  [phone] Current Barriers:  No barriers noted  Pharmacist Clinical Goal(s):  Over the next 90 days, patient will maintain control of diabetes as evidenced by A1c less than 7%  through collaboration with PharmD and provider.   Interventions: 1:1 collaboration with SSteele Sizer MD regarding development and update of comprehensive plan of care as evidenced by provider attestation and co-signature Inter-disciplinary care team collaboration (see longitudinal plan of care) Comprehensive medication review performed; medication list updated in electronic medical record  Hypertension (BP goal <140/90) -Not ideally controlled -Current treatment: Amlodipine 10 mg daily  Olmesartan 20 mg daily  -Medications previously tried: NA  -Current home readings: 1109-323'Fsystolic  -Current dietary habits: Patient does not follow any specific dietary regimen -Current exercise habits: Patient does not follow any specific exercise regimen  -Denies hypotensive/hypertensive symptoms -Patient has not filled Olmesartan since Feb 2022. She endorses adherence to the medication, but thinks she may miss a dose "every now and then" -Recommended to continue current medication  Hyperlipidemia: (LDL goal < 70) -Controlled -Current treatment: Lovaza 1g twice daily  Pravastatin 40 mg at bedtime  -Medications previously tried: NA  -Educated on Importance of limiting foods high in  cholesterol; -Recommended to continue current medication  Diabetes (A1c goal <7%) -Controlled -Current medications: Metformin XR 750 mg 2 tablets daily  -Medications previously tried: NA  -Current home glucose readings (checks 2-3 times weekly)  fasting glucose: 161, 140,  -Denies hypoglycemic/hyperglycemic symptoms -Educated on A1c and blood sugar goals; Carbohydrate counting and/or plate method -Counseled to check feet daily and get yearly eye exams -Recommended to continue current medication  Osteoporosis(Goal Maintain bone density  and prevent fractures) -Controlled -Patient is a candidate for pharmacologic treatment due to T-Score < -2.5 in femoral neck -Current treatment  Prolia 60 mg every 6 months  -Medications previously tried: NA  -Recommend weight-bearing and muscle strengthening exercises for building and maintaining bone density. -Recommended to continue current medication  Patient Goals/Self-Care Activities Over the next 90 days, patient will:  - check glucose daily before breakfast, document, and provide at future appointments check blood pressure weekly, document, and provide at future appointments  Follow Up Plan: ***

## 2021-04-24 ENCOUNTER — Encounter: Payer: Medicare Other | Admitting: Anesthesiology

## 2021-04-29 ENCOUNTER — Other Ambulatory Visit: Payer: Self-pay | Admitting: Family Medicine

## 2021-04-29 DIAGNOSIS — D649 Anemia, unspecified: Secondary | ICD-10-CM

## 2021-04-29 DIAGNOSIS — R790 Abnormal level of blood mineral: Secondary | ICD-10-CM

## 2021-05-02 NOTE — Patient Instructions (Signed)
Visit Information ?It was great speaking with you today!  Please let me know if you have any questions about our visit. ? ? Goals Addressed   ? ?  ?  ?  ?  ? This Visit's Progress  ?  Monitor and Manage My Blood Sugar-Diabetes Type 2   On track  ?  Timeframe:  Long-Range Goal ?Priority:  High ?Start Date: 04/13/2020                            ?Expected End Date: 10/12/2021                     ? ?Follow Up Date 12/18/2020  ? ?- check blood sugar once daily before breakfast   ?- check blood sugar if I feel it is too high or too low ?- enter blood sugar readings and medication or insulin into daily log  ?  ?Why is this important?   ?Checking your blood sugar at home helps to keep it from getting very high or very low.  ?Writing the results in a diary or log helps the doctor know how to care for you.  ?Your blood sugar log should have the time, date and the results.  ?Also, write down the amount of insulin or other medicine that you take.  ?Other information, like what you ate, exercise done and how you were feeling, will also be helpful.   ?  ?Notes:  ?  ? ?  ? ? ?Patient Care Plan: General Pharmacy (Adult)  ?  ? ?Problem Identified: Hypertension, Hyperlipidemia, Diabetes, Coronary Artery Disease, Chronic Kidney Disease, Osteoporosis and Osteoarthritis   ?Priority: High  ?  ? ?Long-Range Goal: Patient-Specific Goal   ?Start Date: 04/13/2020  ?Expected End Date: 10/12/2021  ?This Visit's Progress: On track  ?Recent Progress: On track  ?Priority: High  ?Note:   ?Current Barriers:  ?No barriers noted ? ?Pharmacist Clinical Goal(s):  ?Over the next 90 days, patient will maintain control of diabetes as evidenced by A1c less than 7%  through collaboration with PharmD and provider.  ? ?Interventions: ?1:1 collaboration with [name], MD regarding development and update of comprehensive plan of care as evidenced by provider attestation and co-signature ?Inter-disciplinary care team collaboration (see longitudinal plan of  care) ?Comprehensive medication review performed; medication list updated in electronic medical record ? ?Hypertension (BP goal <140/90) ?-Not ideally controlled ?-Current treatment: ?Amlodipine 10 mg daily: Appropriate, Effective, Safe, Accessible  ?Olmesartan 20 mg daily: Appropriate, Effective, Safe, Accessible   ?-Medications previously tried: NA  ?-Current home readings: 597-416'L systolic  ?-Current dietary habits: Patient does not follow any specific dietary regimen ?-Current exercise habits: Patient does not follow any specific exercise regimen  ?-Denies hypotensive/hypertensive symptoms ?-Recommended to continue current medication ? ?Hyperlipidemia: (LDL goal < 70) ?-Controlled ?-Current treatment: ?Lovaza 1g twice daily  ?Pravastatin 40 mg at bedtime  ?-Medications previously tried: NA  ?-Educated on Importance of limiting foods high in cholesterol; ?-Recommended to continue current medication ? ?Diabetes (A1c goal <7%) ?-Controlled ?-Current medications: ?Metformin XR 750 mg 2 tablets daily: Appropriate, Effective, Safe, Accessible   ?-Medications previously tried: NA  ?-Current home glucose readings (checks 2-3 times weekly)  ?fasting glucose: 161, 140,  ?-Denies hypoglycemic/hyperglycemic symptoms ?-Educated on A1c and blood sugar goals; ?Carbohydrate counting and/or plate method ?-Counseled to check feet daily and get yearly eye exams ?-Recommended to continue current medication ? ?Osteoporosis(Goal Maintain bone density and prevent fractures) ?-Controlled ?-  Patient is a candidate for pharmacologic treatment due to T-Score < -2.5 in femoral neck ?-Current treatment  ?Prolia 60 mg every 6 months  ?-Medications previously tried: NA  ?-Recommend weight-bearing and muscle strengthening exercises for building and maintaining bone density. ?-Recommended to continue current medication ? ?Patient Goals/Self-Care Activities ?Over the next 90 days, patient will:  ?- check glucose daily before breakfast,  document, and provide at future appointments ?check blood pressure weekly, document, and provide at future appointments ? ?Follow Up Plan: Telephone follow up appointment with care management team member scheduled for:  08/23/2021 at 3:45 PM ?  ? ?Patient agreed to services and verbal consent obtained.  ? ?The patient verbalized understanding of instructions, educational materials, and care plan provided today and declined offer to receive copy of patient instructions, educational materials, and care plan.  ? ?Doristine Section, BCACP, CPP ?Clinical Pharmacist Practitioner  ?[facility] ?[phone] ?  ?

## 2021-05-08 ENCOUNTER — Ambulatory Visit: Payer: Medicare Other | Admitting: *Deleted

## 2021-05-08 ENCOUNTER — Telehealth: Payer: Self-pay

## 2021-05-08 DIAGNOSIS — I10 Essential (primary) hypertension: Secondary | ICD-10-CM

## 2021-05-08 DIAGNOSIS — E1129 Type 2 diabetes mellitus with other diabetic kidney complication: Secondary | ICD-10-CM

## 2021-05-08 NOTE — Telephone Encounter (Signed)
Copied from [place] [phone]. Topic: General - Other >> May 08, 2021  9:08 AM Alanda Slim E wrote: Reason for CRM: Pt called to speak with Chrystal Land/ advised pt she would call her back per Chrystal message on teams

## 2021-05-09 ENCOUNTER — Ambulatory Visit (INDEPENDENT_AMBULATORY_CARE_PROVIDER_SITE_OTHER): Payer: Medicare Other

## 2021-05-09 DIAGNOSIS — Z0000 Encounter for general adult medical examination without abnormal findings: Secondary | ICD-10-CM

## 2021-05-09 NOTE — Chronic Care Management (AMB) (Signed)
?Chronic Care Management  ? ? Clinical Social Work Note ? ?05/09/2021 ?Name: Taylor [name] MRN: [id number] DOB: 1932/05/09 ? ?Taylor [name] is a 88 y.o. year old female who is a primary care patient of [name], MD. The CCM team was consulted to assist the patient with chronic disease management and/or care coordination needs related to: [employer] .  ? ?Engaged with patient by telephone for follow up visit in response to provider referral for social work chronic care management and care coordination services.  ? ?Consent to Services:  ?The patient was given information about Chronic Care Management services, agreed to services, and gave verbal consent prior to initiation of services.  Please see initial visit note for detailed documentation.  ? ?Patient agreed to services and consent obtained.  ? ?Assessment: Review of patient past medical history, allergies, medications, and health status, including review of relevant consultants reports was performed today as part of a comprehensive evaluation and provision of chronic care management and care coordination services.    ? ?SDOH (Social Determinants of Health) assessments and interventions performed:   ? ?Advanced Directives Status: Not addressed in this encounter. ? ?CCM Care Plan ? ?Allergies  ?Allergen Reactions  ? Ace Inhibitors Cough  ? ? ?Outpatient Encounter Medications as of 05/08/2021  ?Medication Sig  ? acetaminophen (TYLENOL) 500 MG tablet Take 1 tablet (500 mg total) by mouth 3 (three) times daily. (Patient taking differently: Take 500 mg by mouth every 6 (six) hours as needed for moderate pain or headache.)  ? acetaminophen (TYLENOL) 500 MG tablet Take by mouth.  ? amLODipine (NORVASC) 5 MG tablet Take 1 tablet (5 mg total) by mouth daily.  ? brimonidine-timolol (COMBIGAN) 0.2-0.5 % ophthalmic solution Place 1 drop into both eyes 2 (two) times daily.   ? Calcium 250 MG CAPS Take by mouth.  ? Calcium Carb-Cholecalciferol 500-10 MG-MCG  TABS Take by mouth.  ? Calcium Carbonate-Vitamin D (OYSTER SHELL CALCIUM 500 + D PO) Take 1 tablet by mouth 2 (two) times a day.  ? cephALEXin (KEFLEX) 250 MG capsule Take 1 capsule (250 mg total) by mouth 3 (three) times daily.  ? denosumab (PROLIA) 60 MG/ML SOSY injection Inject into the skin.  ? Docusate Sodium 100 MG capsule Take 100 mg by mouth 2 (two) times daily.  ? dorzolamide (TRUSOPT) 2 % ophthalmic solution 1 drop 2 (two) times daily.  ? famotidine (PEPCID) 20 MG tablet TAKE 1 TABLET BY MOUTH EVERY DAY  ? ferrous sulfate 325 (65 FE) MG tablet TAKE 1 TABLET BY MOUTH EVERY DAY WITH BREAKFAST  ? fluticasone (FLONASE) 50 MCG/ACT nasal spray SPRAY 2 SPRAYS INTO EACH NOSTRIL EVERY DAY  ? lactulose (CHRONULAC) 10 GM/15ML solution Take 30 mLs (20 g total) by mouth daily as needed for mild constipation.  ? Lancets (ONETOUCH DELICA PLUS [id number]) MISC 1 each by Does not apply route daily.  ? lubiprostone (AMITIZA) 24 MCG capsule TAKE 1 CAPSULE (24 MCG TOTAL) BY MOUTH 2 (TWO) TIMES DAILY WITH A MEAL.  ? metFORMIN (GLUCOPHAGE-XR) 750 MG 24 hr tablet TAKE 2 TABLETS (1,500 MG TOTAL) BY MOUTH DAILY WITH BREAKFAST.  ? olmesartan (BENICAR) 20 MG tablet Take 1 tablet (20 mg total) by mouth daily.  ? omega-3 acid ethyl esters (LOVAZA) 1 g capsule TAKE 1 CAPSULE BY MOUTH TWICE A DAY  ? ONETOUCH ULTRA test strip USE AS DIRECTED  ? oxyCODONE-acetaminophen (PERCOCET) 7.5-325 MG tablet Take 1 tablet by mouth every 8 (eight) hours as needed for moderate  pain or severe pain.  ? pravastatin (PRAVACHOL) 40 MG tablet TAKE 1 TABLET BY MOUTH EVERYDAY AT BEDTIME  ? ROCKLATAN 0.02-0.005 % SOLN Apply 1 drop to eye at bedtime.  ? Vitamin D, Ergocalciferol, (DRISDOL) 1.25 MG (50000 UNIT) CAPS capsule Take 50,000 Units by mouth every 7 (seven) days.  ? ?No facility-administered encounter medications on file as of 05/08/2021.  ? ? ?Patient Active Problem List  ? Diagnosis Date Noted  ? Closed compression fracture of body of L1 vertebra ([place])  09/23/2020  ? Gastric polyp   ? Polyp of colon   ? Morbid obesity ([place]) 04/08/2018  ? Age-related osteoporosis without current pathological fracture 09/26/2017  ? Pain in both hands 09/23/2017  ? Hyperparathyroidism ([place]) 08/06/2017  ? Chronic kidney disease, stage III (moderate) ([place]) 08/06/2017  ? HLD (hyperlipidemia) 07/25/2017  ? Osteoporotic compression fracture of spine ([place]) 03/22/2017  ? Assistance needed with transportation 03/21/2017  ? Iron deficiency anemia 12/19/2016  ? Aortic atherosclerosis ([place]) 11/23/2016  ? Anterolisthesis 11/23/2016  ? Chronic pain syndrome 09/13/2016  ? Vitamin D deficiency 08/19/2016  ? Anemia, unspecified 08/19/2016  ? Primary open angle glaucoma (POAG) of both eyes, severe stage 08/17/2016  ? Hypertensive retinopathy 08/17/2016  ? Long term current use of opiate analgesic 07/19/2016  ? Dyslipidemia associated with type 2 diabetes mellitus ([place]) 04/16/2016  ? Chronic midline low back pain without sciatica 04/16/2016  ? Chronic pain of right knee 04/16/2016  ? Vaginal dryness 02/22/2015  ? Calculus of kidney 11/10/2014  ? Osteoarthritis, multiple sites 09/09/2014  ? Type 2 diabetes mellitus with microalbuminuria ([place]) 09/09/2014  ? Glaucoma 09/09/2014  ? Benign hypertension with CKD (chronic kidney disease) stage III ([place]) 09/09/2014  ? Chronic pain 09/09/2014  ? Tinea corporis 09/09/2014  ? Umbilical hernia 59/93/5701  ? Mass of right breast   ? ? ?Conditions to be addressed/monitored: HTN and DMII; Level of care concerns ? ?Care Plan : General Social Work (Adult)  ?Updates made by Vern Claude, LCSW since 05/09/2021 12:00 AM  ?  ? ?Problem: Home and Family Safety (Wellness)   ?  ? ?Goal: Home and Family Safety Maintained   ?Recent Progress: On track  ?Priority: Medium  ?Note:   ?Current Barriers:  ?Limited social support and Limited access to caregiver ?Suicidal Ideation/Homicidal Ideation: No ? ?Clinical Social Work Goal(s):  ?Over the next 90 days, patient will work with  SW bi-weekly by telephone or in person to develop a plan for additional in home asisstance ?explore community resource options for unmet needs related [id number] in home care  ? ?Interventions: ?SDOH Interventions   ? ?Flowsheet Row Most Recent Value  ?SDOH Interventions   ?SDOH Interventions for the Following Domains Stress  ?Stress Interventions --  [assistance to be provided to assist with in home care]  ? ?  ?Patient continues to confirm that she has a private duty aid to assist with her ADl's-however aid only coming 1 day per week ?Confirmed that [facility] services will now resume--PT coming tomorrow ?Patient confirmed trip to see her son went well and has helped with her  motivation to take medication regularly-patient's family supportive and assisted with medication reminders ?Benefits of medication adherence discussed-confirms assistance and support from family helped with adherence as she plans to continue the practice of taking her medication regularly ?Supportive counseling provided regarding medication adherence ?Self care and safety emphasized ?Assessed for additional community resource needs-patient confirmed no additional needs at this time. ?05/09/21 Return phone call to patient by  request to discuss options for in home care. Per patient, her previous aid is no longer providing assistance and patient is now needing additional assistance with light housekeeping and assistance with ADL's. ?Options for additional in home care explored ?Provided patient with resources for in home care that do not require a minimum: [phone], [place], [place] care [phone] ?Patient agreeable to discussing options discussed with her son to arrange in home care ? ?Patient Self Care Activities:  ?Calls provider office for new concerns or questions ? ?Patient Coping Strengths:  ?Hopefulness ?Self Advocate ?Able to Communicate Effectively ? ?Patient Self Care Deficits:  ?Unable to  perform ADLs independently ?Unable to perform IADLs independently ? ?Please see past updates related to this goal by clicking on the "Past Updates" button in the selected goal  ? ? ? ? ?  ?  ? ?Follow Up P

## 2021-05-09 NOTE — Progress Notes (Signed)
? ?Subjective:  ? Taylor [name] is a 88 y.o. female who presents for Medicare Annual (Subsequent) preventive examination. ? ?Virtual Visit via Telephone Note ? ?I connected with  Taylor [name] on 05/09/21 at  1:00 PM EDT by telephone and verified that I am speaking with the correct person using two identifiers. ? ?Location: ?Patient: home ?Provider: [facility] ?Persons participating in the virtual visit: patient/Nurse Health Advisor ?  ?I discussed the limitations, risks, security and privacy concerns of performing an evaluation and management service by telephone and the availability of in person appointments. The patient expressed understanding and agreed to proceed. ? ?Interactive audio and video telecommunications were attempted between this nurse and patient, however failed, due to patient having technical difficulties OR patient did not have access to video capability.  We continued and completed visit with audio only. ? ?Some vital signs may be absent or patient reported.  ? ?[name], LPN ? ? ?Review of Systems    ? ?Cardiac Risk Factors include: advanced age (>55mn, >>69women);diabetes mellitus;dyslipidemia;hypertension;obesity (BMI >30kg/m2);sedentary lifestyle ? ?   ?Objective:  ?  ?There were no vitals filed for this visit. ?There is no height or weight on file to calculate BMI. ? ?Advanced Directives 05/09/2021 11/29/2020 09/05/2020 05/05/2020 07/16/2019 07/02/2019 05/12/2019  ?Does Patient Have a Medical Advance Directive? Yes No No No No Yes Yes  ?Type of [place] will - - - - - Living will;Healthcare Power of Attorney  ?Does patient want to make changes to medical advance directive? - - - - - - No - Patient declined  ?Copy of [place]? No - copy requested - - - - - No - copy requested  ?Would patient like information on creating a medical advance directive? - - No - Patient declined Yes (MAU/Ambulatory/Procedural Areas - Information  given) - - -  ? ? ?Current Medications (verified) ?Outpatient Encounter Medications as of 05/09/2021  ?Medication Sig  ? acetaminophen (TYLENOL) 500 MG tablet Take 1 tablet (500 mg total) by mouth 3 (three) times daily. (Patient taking differently: Take 500 mg by mouth every 6 (six) hours as needed for moderate pain or headache.)  ? acetaminophen (TYLENOL) 500 MG tablet Take by mouth.  ? amLODipine (NORVASC) 5 MG tablet Take 1 tablet (5 mg total) by mouth daily.  ? brimonidine-timolol (COMBIGAN) 0.2-0.5 % ophthalmic solution Place 1 drop into both eyes 2 (two) times daily.   ? Calcium Carbonate-Vitamin D (OYSTER SHELL CALCIUM 500 + D PO) Take 1 tablet by mouth 2 (two) times a day.  ? denosumab (PROLIA) 60 MG/ML SOSY injection Inject into the skin.  ? Docusate Sodium 100 MG capsule Take 100 mg by mouth 2 (two) times daily.  ? dorzolamide (TRUSOPT) 2 % ophthalmic solution 1 drop 2 (two) times daily.  ? famotidine (PEPCID) 20 MG tablet TAKE 1 TABLET BY MOUTH EVERY DAY  ? ferrous sulfate 325 (65 FE) MG tablet TAKE 1 TABLET BY MOUTH EVERY DAY WITH BREAKFAST  ? fluticasone (FLONASE) 50 MCG/ACT nasal spray SPRAY 2 SPRAYS INTO EACH NOSTRIL EVERY DAY  ? lactulose (CHRONULAC) 10 GM/15ML solution Take 30 mLs (20 g total) by mouth daily as needed for mild constipation.  ? Lancets (ONETOUCH DELICA PLUS [id number] MISC 1 each by Does not apply route daily.  ? lubiprostone (AMITIZA) 24 MCG capsule TAKE 1 CAPSULE (24 MCG TOTAL) BY MOUTH 2 (TWO) TIMES DAILY WITH A MEAL.  ? metFORMIN (GLUCOPHAGE-XR) 750 MG 24  hr tablet TAKE 2 TABLETS (1,500 MG TOTAL) BY MOUTH DAILY WITH BREAKFAST.  ? olmesartan (BENICAR) 20 MG tablet Take 1 tablet (20 mg total) by mouth daily.  ? omega-3 acid ethyl esters (LOVAZA) 1 g capsule TAKE 1 CAPSULE BY MOUTH TWICE A DAY  ? ONETOUCH ULTRA test strip USE AS DIRECTED  ? oxyCODONE-acetaminophen (PERCOCET) 7.5-325 MG tablet Take 1 tablet by mouth every 8 (eight) hours as needed for moderate pain or severe pain.  ?  pravastatin (PRAVACHOL) 40 MG tablet TAKE 1 TABLET BY MOUTH EVERYDAY AT BEDTIME  ? ROCKLATAN 0.02-0.005 % SOLN Apply 1 drop to eye at bedtime.  ? Vitamin D, Ergocalciferol, (DRISDOL) 1.25 MG (50000 UNIT) CAPS capsule Take 50,000 Units by mouth every 7 (seven) days.  ? [DISCONTINUED] Calcium 250 MG CAPS Take by mouth.  ? [DISCONTINUED] cephALEXin (KEFLEX) 250 MG capsule Take 1 capsule (250 mg total) by mouth 3 (three) times daily.  ? [DISCONTINUED] Calcium Carb-Cholecalciferol 500-10 MG-MCG TABS Take by mouth.  ? ?No facility-administered encounter medications on file as of 05/09/2021.  ? ? ?Allergies (verified) ?Ace inhibitors  ? ?History: ?Past Medical History:  ?Diagnosis Date  ? Abnormal mammogram, unspecified 2013  ? Anemia   ? Arthritis   ? Breast screening, unspecified 2013  ? Diabetes mellitus without complication ([place]) 1610  ? non insulin dependent  ? Dysrhythmia   ? IRREGULAR HEART BEAT  ? GERD (gastroesophageal reflux disease)   ? Glaucoma 2003  ? Gout   ? History of kidney stones   ? H/O  ? Hyperlipidemia 2008  ? Hypertension 1980's  ? Lump or mass in breast 01/03/2012  ? left breast  ? Obesity, unspecified 2013  ? Osteoporosis   ? Shingles 2013  ? Special screening for malignant neoplasms, colon 2013  ? ?Past Surgical History:  ?Procedure Laterality Date  ? ABDOMINAL HYSTERECTOMY  1958  ? menorrhagia  ? BACK SURGERY    ? BREAST BIOPSY Left 01/25/2012  ? BREAST BIOPSY Right 04/08/2019  ? hear clip, [place] bx, path pending  ? BREAST EXCISIONAL BIOPSY    ? BREAST LUMPECTOMY WITH NEEDLE LOCALIZATION Right 05/12/2019  ? Procedure: BREAST LUMPECTOMY WITH NEEDLE LOCALIZATION;  Surgeon: Olean Ree, MD;  Location: [facility] ORS;  Service: General;  Laterality: Right;  ? CATARACT EXTRACTION, BILATERAL Bilateral   ? 1st 06/07/15 and the 2nd 06/21/15  ? CHOLECYSTECTOMY    ? COLONOSCOPY  2003  ? [facility]  ? COLONOSCOPY WITH PROPOFOL N/A 07/02/2019  ? Procedure: COLONOSCOPY WITH PROPOFOL;  Surgeon: Virgel Manifold, MD;  Location:  [facility] ENDOSCOPY;  Service: Endoscopy;  Laterality: N/A;  ? COLONOSCOPY WITH PROPOFOL N/A 07/16/2019  ? Procedure: COLONOSCOPY WITH PROPOFOL;  Surgeon: Virgel Manifold, MD;  Location: [facility] ENDOSCOPY;  Service: Endoscopy;  Laterality: N/A;  ? ESOPHAGOGASTRODUODENOSCOPY (EGD) WITH PROPOFOL N/A 07/02/2019  ? Procedure: ESOPHAGOGASTRODUODENOSCOPY (EGD) WITH PROPOFOL;  Surgeon: Virgel Manifold, MD;  Location: [facility] ENDOSCOPY;  Service: Endoscopy;  Laterality: N/A;  ? EYE SURGERY    ? KNEE SURGERY  2009,2011,2012  ? twice on right and once on left  ? KNEE SURGERY    ? [place]  ? [place] SURGERY  2004  ? TONSILLECTOMY    ? age of 32  ? ?Family History  ?Problem Relation Age of Onset  ? Cancer Mother   ?     skin cancer  ? Stroke Mother   ? Heart disease Father   ? Breast cancer Neg Hx   ? ?  Social History  ? ?Socioeconomic History  ? Marital status: Divorced  ?  Spouse name: Not on file  ? Number of children: 4  ? Years of education: Not on file  ? Highest education level: Some college, no degree  ?Occupational History  ? Occupation: retired  ?  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 Smoking status: Never  ? Smokeless tobacco: Never  ?Vaping Use  ? Vaping Use: Never used  ?Substance and Sexual Activity  ? Alcohol use: No  ?  Alcohol/week: 0.0 standard drinks  ? Drug use: No  ? Sexual activity: Not Currently  ?Other Topics Concern  ? Not on file  ?Social History Narrative  ? Lives alone  ? Children do not live in town  ? Friend helps out: Cira Rue - [phone]  ? Independent prior to her fall 10/2016  ? ?Social Determinants of Health  ? ?Financial Resource Strain: Low Risk   ? Difficulty of Paying Living Expenses: Not hard at all  ?Food Insecurity: No Food Insecurity  ? Worried About Charity fundraiser in the Last Year: Never true  ? Ran Out of Food in the Last Year: Never true  ?Transportation Needs: Unmet Transportation Needs  ? Lack of Transportation (Medical): Yes  ?  Lack of Transportation (Non-Medical): Yes  ?Physical Activity: Inactive  ? Days of Exercise per Week: 0 days  ? Minutes of Exercise per Session: 0 min  ?Stress: No Stress Concern Present  ? Feeling o

## 2021-05-09 NOTE — Patient Instructions (Signed)
Taylor [name] , ?Thank you for taking time to come for your Medicare Wellness Visit. I appreciate your ongoing commitment to your health goals. Please review the following plan we discussed and let me know if I can assist you in the future.  ? ?Screening recommendations/referrals: ?Colonoscopy: no longer required ?Mammogram: done 10/21/20 ?Bone Density: done 05/28/19 ?Recommended yearly ophthalmology/optometry visit for glaucoma screening and checkup ?Recommended yearly dental visit for hygiene and checkup ? ?Vaccinations: ?Influenza vaccine: done 12/16/20 ?Pneumococcal vaccine: done 08/05/15 ?Tdap vaccine: due ?Shingles vaccine: Shingrix discussed. Please contact your pharmacy for coverage information.  ?Covid-19: done 11/12/19 & 12/10/19 ? ?Advanced directives: Please bring a copy of your health care power of attorney and living will to the office at your convenience.  ? ?Conditions/risks identified: Recommend continuing fall prevention in the home ? ?Next appointment: Follow up in one year for your annual wellness visit  ? ? ?Preventive Care 34 Years and Older, Female ?Preventive care refers to lifestyle choices and visits with your health care provider that can promote health and wellness. ?What does preventive care include? ?A yearly physical exam. This is also called an annual well check. ?Dental exams once or twice a year. ?Routine eye exams. Ask your health care provider how often you should have your eyes checked. ?Personal lifestyle choices, including: ?Daily care of your teeth and gums. ?Regular physical activity. ?Eating a healthy diet. ?Avoiding tobacco and drug use. ?Limiting alcohol use. ?Practicing safe sex. ?Taking low-dose aspirin every day. ?Taking vitamin and mineral supplements as recommended by your health care provider. ?What happens during an annual well check? ?The services and screenings done by your health care provider during your annual well check will depend on your age, overall health,  lifestyle risk factors, and family history of disease. ?Counseling  ?Your health care provider may ask you questions about your: ?Alcohol use. ?Tobacco use. ?Drug use. ?Emotional well-being. ?Home and relationship well-being. ?Sexual activity. ?Eating habits. ?History of falls. ?Memory and ability to understand (cognition). ?Work and work Statistician. ?Reproductive health. ?Screening  ?You may have the following tests or measurements: ?Height, weight, and BMI. ?Blood pressure. ?Lipid and cholesterol levels. These may be checked every 5 years, or more frequently if you are over 42 years old. ?Skin check. ?Lung cancer screening. You may have this screening every year starting at age 35 if you have a 30-pack-year history of smoking and currently smoke or have quit within the past 15 years. ?Fecal occult blood test (FOBT) of the stool. You may have this test every year starting at age 57. ?Flexible sigmoidoscopy or colonoscopy. You may have a sigmoidoscopy every 5 years or a colonoscopy every 10 years starting at age 20. ?Hepatitis C blood test. ?Hepatitis B blood test. ?Sexually transmitted disease (STD) testing. ?Diabetes screening. This is done by checking your blood sugar (glucose) after you have not eaten for a while (fasting). You may have this done every 1-3 years. ?Bone density scan. This is done to screen for osteoporosis. You may have this done starting at age 59. ?Mammogram. This may be done every 1-2 years. Talk to your health care provider about how often you should have regular mammograms. ?Talk with your health care provider about your test results, treatment options, and if necessary, the need for more tests. ?Vaccines  ?Your health care provider may recommend certain vaccines, such as: ?Influenza vaccine. This is recommended every year. ?Tetanus, diphtheria, and acellular pertussis (Tdap, Td) vaccine. You may need a Td booster every 10 years. ?Zoster  vaccine. You may need this after age  69. ?Pneumococcal 13-valent conjugate (PCV13) vaccine. One dose is recommended after age 65. ?Pneumococcal polysaccharide (PPSV23) vaccine. One dose is recommended after age 65. ?Talk to your health care provider about which screenings and vaccines you need and how often you need them. ?This information is not intended to replace advice given to you by your health care provider. Make sure you discuss any questions you have with your health care provider. ?Document Released: 03/04/2015 Document Revised: 10/26/2015 Document Reviewed: 12/07/2014 ?Elsevier Interactive Patient Education ? 2017 [place]. ? ?Fall Prevention in the Home ?Falls can cause injuries. They can happen to people of all ages. There are many things you can do to make your home safe and to help prevent falls. ?What can I do on the outside of my home? ?Regularly fix the edges of walkways and driveways and fix any cracks. ?Remove anything that might make you trip as you walk through a door, such as a raised step or threshold. ?Trim any bushes or trees on the path to your home. ?Use bright outdoor lighting. ?Clear any walking paths of anything that might make someone trip, such as rocks or tools. ?Regularly check to see if handrails are loose or broken. Make sure that both sides of any steps have handrails. ?Any raised decks and porches should have guardrails on the edges. ?Have any leaves, snow, or ice cleared regularly. ?Use sand or salt on walking paths during winter. ?Clean up any spills in your garage right away. This includes oil or grease spills. ?What can I do in the bathroom? ?Use night lights. ?Install grab bars by the toilet and in the tub and shower. Do not use towel bars as grab bars. ?Use non-skid mats or decals in the tub or shower. ?If you need to sit down in the shower, use a plastic, non-slip stool. ?Keep the floor dry. Clean up any water that spills on the floor as soon as it happens. ?Remove soap buildup in the tub or shower  regularly. ?Attach bath mats securely with double-sided non-slip rug tape. ?Do not have throw rugs and other things on the floor that can make you trip. ?What can I do in the bedroom? ?Use night lights. ?Make sure that you have a light by your bed that is easy to reach. ?Do not use any sheets or blankets that are too big for your bed. They should not hang down onto the floor. ?Have a firm chair that has side arms. You can use this for support while you get dressed. ?Do not have throw rugs and other things on the floor that can make you trip. ?What can I do in the kitchen? ?Clean up any spills right away. ?Avoid walking on wet floors. ?Keep items that you use a lot in easy-to-reach places. ?If you need to reach something above you, use a strong step stool that has a grab bar. ?Keep electrical cords out of the way. ?Do not use floor polish or wax that makes floors slippery. If you must use wax, use non-skid floor wax. ?Do not have throw rugs and other things on the floor that can make you trip. ?What can I do with my stairs? ?Do not leave any items on the stairs. ?Make sure that there are handrails on both sides of the stairs and use them. Fix handrails that are broken or loose. Make sure that handrails are as long as the stairways. ?Check any carpeting to make sure that it  is firmly attached to the stairs. Fix any carpet that is loose or worn. ?Avoid having throw rugs at the top or bottom of the stairs. If you do have throw rugs, attach them to the floor with carpet tape. ?Make sure that you have a light switch at the top of the stairs and the bottom of the stairs. If you do not have them, ask someone to add them for you. ?What else can I do to help prevent falls? ?Wear shoes that: ?Do not have high heels. ?Have rubber bottoms. ?Are comfortable and fit you well. ?Are closed at the toe. Do not wear sandals. ?If you use a stepladder: ?Make sure that it is fully opened. Do not climb a closed stepladder. ?Make sure that  both sides of the stepladder are locked into place. ?Ask someone to hold it for you, if possible. ?Clearly mark and make sure that you can see: ?Any grab bars or handrails. ?First and last steps. ?Where the edge of each step is.

## 2021-05-09 NOTE — Patient Instructions (Signed)
Visit Information ? ?Thank you for taking time to visit with me today. Please don't hesitate to contact me if I can be of assistance to you before our next scheduled telephone appointment. ? ?Following are the goals we discussed today:  ? ?- follow-up on any referrals for help I am given for private duty care ?- think ahead to make sure my need does not become an emergency ?- have a back-up plan ?- make a list of family or friends that I can call  ?-discuss medication adherence with your provider  ? ?Our next appointment is by telephone on 05/22/21 at 10am ? ?Please call the care guide team at [phone] if you need to cancel or reschedule your appointment.  ? ?If you are experiencing a Mental Health or [place] or need someone to talk to, please call the Suicide and Crisis Lifeline: 988  ? ?Patient verbalizes understanding of instructions and care plan provided today and agrees to view in [place]. Active MyChart status confirmed with patient.   ? ?Telephone follow up appointment with care management team member scheduled for: 05/22/21 ? ?Nazifa Trinka, LCSW ?Clinical Social Worker  ?[facility] ?[phone] ? ?

## 2021-05-10 ENCOUNTER — Encounter: Payer: Self-pay | Admitting: Anesthesiology

## 2021-05-10 ENCOUNTER — Ambulatory Visit: Payer: Medicare Other | Attending: Anesthesiology | Admitting: Anesthesiology

## 2021-05-10 ENCOUNTER — Other Ambulatory Visit: Payer: Self-pay

## 2021-05-10 DIAGNOSIS — G894 Chronic pain syndrome: Secondary | ICD-10-CM

## 2021-05-10 DIAGNOSIS — Z79891 Long term (current) use of opiate analgesic: Secondary | ICD-10-CM

## 2021-05-10 DIAGNOSIS — M25561 Pain in right knee: Secondary | ICD-10-CM

## 2021-05-10 DIAGNOSIS — M159 Polyosteoarthritis, unspecified: Secondary | ICD-10-CM

## 2021-05-10 DIAGNOSIS — M546 Pain in thoracic spine: Secondary | ICD-10-CM

## 2021-05-10 DIAGNOSIS — G8929 Other chronic pain: Secondary | ICD-10-CM

## 2021-05-10 DIAGNOSIS — M545 Low back pain, unspecified: Secondary | ICD-10-CM

## 2021-05-10 MED ORDER — OXYCODONE-ACETAMINOPHEN 7.5-325 MG PO TABS
1.0000 | ORAL_TABLET | Freq: Three times a day (TID) | ORAL | 0 refills | Status: AC | PRN
Start: 2021-05-13 — End: 2021-06-12

## 2021-05-10 MED ORDER — OXYCODONE-ACETAMINOPHEN 7.5-325 MG PO TABS: 1.0000 | ORAL_TABLET | Freq: Three times a day (TID) | ORAL | 0 refills | Status: DC | PRN

## 2021-05-11 ENCOUNTER — Other Ambulatory Visit: Payer: Self-pay | Admitting: Family Medicine

## 2021-05-15 NOTE — Progress Notes (Signed)
Virtual Visit via Telephone Note ? ?I connected with Taylor [name] on 05/15/21 at  2:45 PM EDT by telephone and verified that I am speaking with the correct person using two identifiers. ? ?Location: ?Patient: Home ?Provider: Pain control center ?  ?I discussed the limitations, risks, security and privacy concerns of performing an evaluation and management service by telephone and the availability of in person appointments. I also discussed with the patient that there may be a patient responsible charge related to this service. The patient expressed understanding and agreed to proceed. ? ? ?History of Present Illness: ?I spoke with several people today via telephone as she was unable to do the video portion of the carpets.  She reports that she still having her baseline pain affecting both legs and her low back.  This has been stable.  She takes her medications as prescribed and these continue to work well for her with no side effects reported.  She mainly has a chronic aching gnawing constant pain in the low back with radiation into both legs in the posterior buttocks.  When she takes her medication she gets about 50 to 70% relief lasting 4 to 6 hours before she has recurrence of her baseline pain.  Despite more conservative therapy the pain has been persistent but has responded favorably to chronic opioid management.  No change in lower extremity strength or function of bowel or bladder function is noted.  She has been less active secondary to the cold weather but is trying to increase her activity as of late. ? ?Review of systems: ?General: No fevers or chills ?Pulmonary: No shortness of breath or dyspnea ?Cardiac: No angina or palpitations or lightheadedness ?GI: No abdominal pain or constipation ?Psych: No depression  ?  ?Observations/Objective: ? ? ?Current Outpatient Medications:  ?  [START ON 06/13/2021] oxyCODONE-acetaminophen (PERCOCET) 7.5-325 MG tablet, Take 1 tablet by mouth every 8 (eight) hours as  needed for moderate pain or severe pain., Disp: 90 tablet, Rfl: 0 ?  acetaminophen (TYLENOL) 500 MG tablet, Take 1 tablet (500 mg total) by mouth 3 (three) times daily. (Patient taking differently: Take 500 mg by mouth every 6 (six) hours as needed for moderate pain or headache.), Disp: 90 tablet, Rfl: 0 ?  acetaminophen (TYLENOL) 500 MG tablet, Take by mouth., Disp: , Rfl:  ?  amLODipine (NORVASC) 5 MG tablet, Take 1 tablet (5 mg total) by mouth daily., Disp: 90 tablet, Rfl: 1 ?  brimonidine-timolol (COMBIGAN) 0.2-0.5 % ophthalmic solution, Place 1 drop into both eyes 2 (two) times daily. , Disp: , Rfl:  ?  Calcium Carbonate-Vitamin D (OYSTER SHELL CALCIUM 500 + D PO), Take 1 tablet by mouth 2 (two) times a day., Disp: , Rfl:  ?  denosumab (PROLIA) 60 MG/ML SOSY injection, Inject into the skin., Disp: , Rfl:  ?  Docusate Sodium 100 MG capsule, Take 100 mg by mouth 2 (two) times daily., Disp: , Rfl:  ?  dorzolamide (TRUSOPT) 2 % ophthalmic solution, 1 drop 2 (two) times daily., Disp: , Rfl:  ?  famotidine (PEPCID) 20 MG tablet, TAKE 1 TABLET BY MOUTH EVERY DAY, Disp: 90 tablet, Rfl: 1 ?  ferrous sulfate 325 (65 FE) MG tablet, TAKE 1 TABLET BY MOUTH EVERY DAY WITH BREAKFAST, Disp: 90 tablet, Rfl: 0 ?  fluticasone (FLONASE) 50 MCG/ACT nasal spray, SPRAY 2 SPRAYS INTO EACH NOSTRIL EVERY DAY, Disp: 48 mL, Rfl: 1 ?  lactulose (CHRONULAC) 10 GM/15ML solution, Take 30 mLs (20 g total) by  mouth daily as needed for mild constipation., Disp: 120 mL, Rfl: 0 ?  Lancets (ONETOUCH DELICA PLUS [id number]) MISC, 1 each by Does not apply route daily., Disp: 100 each, Rfl: 2 ?  lubiprostone (AMITIZA) 24 MCG capsule, TAKE 1 CAPSULE (24 MCG TOTAL) BY MOUTH 2 (TWO) TIMES DAILY WITH A MEAL., Disp: 180 capsule, Rfl: 1 ?  metFORMIN (GLUCOPHAGE-XR) 750 MG 24 hr tablet, TAKE 2 TABLETS (1,500 MG TOTAL) BY MOUTH DAILY WITH BREAKFAST., Disp: 180 tablet, Rfl: 1 ?  olmesartan (BENICAR) 20 MG tablet, Take 1 tablet (20 mg total) by mouth daily.,  Disp: 90 tablet, Rfl: 1 ?  omega-3 acid ethyl esters (LOVAZA) 1 g capsule, TAKE 1 CAPSULE BY MOUTH TWICE A DAY, Disp: 180 capsule, Rfl: 1 ?  ONETOUCH ULTRA test strip, USE AS DIRECTED, Disp: 100 strip, Rfl: 12 ?  oxyCODONE-acetaminophen (PERCOCET) 7.5-325 MG tablet, Take 1 tablet by mouth every 8 (eight) hours as needed for moderate pain or severe pain., Disp: 90 tablet, Rfl: 0 ?  pravastatin (PRAVACHOL) 40 MG tablet, TAKE 1 TABLET BY MOUTH EVERYDAY AT BEDTIME, Disp: 90 tablet, Rfl: 0 ?  ROCKLATAN 0.02-0.005 % SOLN, Apply 1 drop to eye at bedtime., Disp: , Rfl:  ?  Vitamin D, Ergocalciferol, (DRISDOL) 1.25 MG (50000 UNIT) CAPS capsule, Take 50,000 Units by mouth every 7 (seven) days., Disp: , Rfl:   ? ?Past Medical History:  ?Diagnosis Date  ? Abnormal mammogram, unspecified 2013  ? Anemia   ? Arthritis   ? Breast screening, unspecified 2013  ? Diabetes mellitus without complication ([place]) 5329  ? non insulin dependent  ? Dysrhythmia   ? IRREGULAR HEART BEAT  ? GERD (gastroesophageal reflux disease)   ? Glaucoma 2003  ? Gout   ? History of kidney stones   ? H/O  ? Hyperlipidemia 2008  ? Hypertension 1980's  ? Lump or mass in breast 01/03/2012  ? left breast  ? Obesity, unspecified 2013  ? Osteoporosis   ? Shingles 2013  ? Special screening for malignant neoplasms, colon 2013  ?  ?Assessment and Plan: ?1. Chronic pain syndrome   ?2. Long term prescription opiate use   ?3. Osteoarthritis of multiple joints, unspecified osteoarthritis type   ?4. Chronic pain of right knee   ?5. Chronic midline low back pain without sciatica   ?6. Acute left-sided thoracic back pain   ?Based on our discussion today and after review of the [facility] practitioner database information I think it is appropriate to refill her medicines for the next 2 months.  Is to be dated for March 26 and April 25.  No other changes in her pharmacologic regimen will be initiated.  I want her to continue efforts at stretching and increase her activity  as tolerated.  Continue follow-up with her primary care physicians for baseline medical care with return to clinic scheduled in 2 months. ? ?Follow Up Instructions: ? ?  ?I discussed the assessment and treatment plan with the patient. The patient was provided an opportunity to ask questions and all were answered. The patient agreed with the plan and demonstrated an understanding of the instructions. ?  ?The patient was advised to call back or seek an in-person evaluation if the symptoms worsen or if the condition fails to improve as anticipated. ? ?I provided 30 minutes of non-face-to-face time during this encounter. ? ? ?[name], MD  ?

## 2021-05-19 DIAGNOSIS — E1169 Type 2 diabetes mellitus with other specified complication: Secondary | ICD-10-CM | POA: Diagnosis not present

## 2021-05-19 DIAGNOSIS — Z7984 Long term (current) use of oral hypoglycemic drugs: Secondary | ICD-10-CM

## 2021-05-19 DIAGNOSIS — M8580 Other specified disorders of bone density and structure, unspecified site: Secondary | ICD-10-CM

## 2021-05-19 DIAGNOSIS — I10 Essential (primary) hypertension: Secondary | ICD-10-CM

## 2021-05-19 DIAGNOSIS — E1129 Type 2 diabetes mellitus with other diabetic kidney complication: Secondary | ICD-10-CM

## 2021-05-19 DIAGNOSIS — E785 Hyperlipidemia, unspecified: Secondary | ICD-10-CM

## 2021-05-22 ENCOUNTER — Ambulatory Visit (INDEPENDENT_AMBULATORY_CARE_PROVIDER_SITE_OTHER): Payer: Medicare Other | Admitting: *Deleted

## 2021-05-22 DIAGNOSIS — E1129 Type 2 diabetes mellitus with other diabetic kidney complication: Secondary | ICD-10-CM

## 2021-05-22 DIAGNOSIS — I10 Essential (primary) hypertension: Secondary | ICD-10-CM

## 2021-05-22 NOTE — Chronic Care Management (AMB) (Signed)
?Chronic Care Management  ? ? Clinical Social Work Note ? ?05/22/2021 ?Name: Taylor [name] MRN: [id number] DOB: 11-Jun-1932 ? ?Taylor [name] is a 88 y.o. year old female who is a primary care patient of Taylor Sizer, MD. The CCM team was consulted to assist the patient with chronic disease management and/or care coordination needs related to: [employer] .  ? ?Engaged with patient by telephone for follow up visit in response to provider referral for social work chronic care management and care coordination services.  ? ?Consent to Services:  ?The patient was given information about Chronic Care Management services, agreed to services, and gave verbal consent prior to initiation of services.  Please see initial visit note for detailed documentation.  ? ?Patient agreed to services and consent obtained.  ? ?Assessment: Review of patient past medical history, allergies, medications, and health status, including review of relevant consultants reports was performed today as part of a comprehensive evaluation and provision of chronic care management and care coordination services.    ? ?SDOH (Social Determinants of Health) assessments and interventions performed:   ? ?Advanced Directives Status: Not addressed in this encounter. ? ?CCM Care Plan ? ?Allergies  ?Allergen Reactions  ? Ace Inhibitors Cough  ? ? ?Outpatient Encounter Medications as of 05/22/2021  ?Medication Sig  ? acetaminophen (TYLENOL) 500 MG tablet Take 1 tablet (500 mg total) by mouth 3 (three) times daily. (Patient taking differently: Take 500 mg by mouth every 6 (six) hours as needed for moderate pain or headache.)  ? acetaminophen (TYLENOL) 500 MG tablet Take by mouth.  ? amLODipine (NORVASC) 5 MG tablet Take 1 tablet (5 mg total) by mouth daily.  ? brimonidine-timolol (COMBIGAN) 0.2-0.5 % ophthalmic solution Place 1 drop into both eyes 2 (two) times daily.   ? Calcium Carbonate-Vitamin D (OYSTER SHELL CALCIUM 500 + D PO) Take 1 tablet by mouth 2  (two) times a day.  ? denosumab (PROLIA) 60 MG/ML SOSY injection Inject into the skin.  ? Docusate Sodium 100 MG capsule Take 100 mg by mouth 2 (two) times daily.  ? dorzolamide (TRUSOPT) 2 % ophthalmic solution 1 drop 2 (two) times daily.  ? famotidine (PEPCID) 20 MG tablet TAKE 1 TABLET BY MOUTH EVERY DAY  ? ferrous sulfate 325 (65 FE) MG tablet TAKE 1 TABLET BY MOUTH EVERY DAY WITH BREAKFAST  ? fluticasone (FLONASE) 50 MCG/ACT nasal spray SPRAY 2 SPRAYS INTO EACH NOSTRIL EVERY DAY  ? lactulose (CHRONULAC) 10 GM/15ML solution Take 30 mLs (20 g total) by mouth daily as needed for mild constipation.  ? Lancets (ONETOUCH DELICA PLUS [id number]) MISC 1 each by Does not apply route daily.  ? lubiprostone (AMITIZA) 24 MCG capsule TAKE 1 CAPSULE (24 MCG TOTAL) BY MOUTH 2 (TWO) TIMES DAILY WITH A MEAL.  ? metFORMIN (GLUCOPHAGE-XR) 750 MG 24 hr tablet TAKE 2 TABLETS (1,500 MG TOTAL) BY MOUTH DAILY WITH BREAKFAST.  ? olmesartan (BENICAR) 20 MG tablet TAKE 1 TABLET BY MOUTH EVERY DAY  ? omega-3 acid ethyl esters (LOVAZA) 1 g capsule TAKE 1 CAPSULE BY MOUTH TWICE A DAY  ? ONETOUCH ULTRA test strip USE AS DIRECTED  ? oxyCODONE-acetaminophen (PERCOCET) 7.5-325 MG tablet Take 1 tablet by mouth every 8 (eight) hours as needed for moderate pain or severe pain.  ? [START ON 06/13/2021] oxyCODONE-acetaminophen (PERCOCET) 7.5-325 MG tablet Take 1 tablet by mouth every 8 (eight) hours as needed for moderate pain or severe pain.  ? pravastatin (PRAVACHOL) 40 MG tablet TAKE 1  TABLET BY MOUTH EVERYDAY AT BEDTIME  ? ROCKLATAN 0.02-0.005 % SOLN Apply 1 drop to eye at bedtime.  ? Vitamin D, Ergocalciferol, (DRISDOL) 1.25 MG (50000 UNIT) CAPS capsule Take 50,000 Units by mouth every 7 (seven) days.  ? ?No facility-administered encounter medications on file as of 05/22/2021.  ? ? ?Patient Active Problem List  ? Diagnosis Date Noted  ? Closed compression fracture of body of L1 vertebra ([place]) 09/23/2020  ? Gastric polyp   ? Polyp of colon   ?  Morbid obesity ([place]) 04/08/2018  ? Age-related osteoporosis without current pathological fracture 09/26/2017  ? Pain in both hands 09/23/2017  ? Hyperparathyroidism ([place]) 08/06/2017  ? Chronic kidney disease, stage III (moderate) ([place]) 08/06/2017  ? HLD (hyperlipidemia) 07/25/2017  ? Osteoporotic compression fracture of spine ([place]) 03/22/2017  ? Assistance needed with transportation 03/21/2017  ? Iron deficiency anemia 12/19/2016  ? Aortic atherosclerosis ([place]) 11/23/2016  ? Anterolisthesis 11/23/2016  ? Chronic pain syndrome 09/13/2016  ? Vitamin D deficiency 08/19/2016  ? Anemia, unspecified 08/19/2016  ? Primary open angle glaucoma (POAG) of both eyes, severe stage 08/17/2016  ? Hypertensive retinopathy 08/17/2016  ? Long term current use of opiate analgesic 07/19/2016  ? Dyslipidemia associated with type 2 diabetes mellitus ([place]) 04/16/2016  ? Chronic midline low back pain without sciatica 04/16/2016  ? Chronic pain of right knee 04/16/2016  ? Vaginal dryness 02/22/2015  ? Calculus of kidney 11/10/2014  ? Osteoarthritis, multiple sites 09/09/2014  ? 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 Glaucoma 09/09/2014  ? Benign hypertension with CKD (chronic kidney disease) stage III ([place]) 09/09/2014  ? Chronic pain 09/09/2014  ? Tinea corporis 09/09/2014  ? Umbilical hernia 81/19/1478  ? Mass of right breast   ? ? ?Conditions to be addressed/monitored:  ? HTN and DMII; Level of care concerns ?Care Plan : General Social Work (Adult)  ?Updates made by Vern Claude, LCSW since 05/22/2021 12:00 AM  ?  ? ?Problem: Home and Family Safety (Wellness)   ?  ? ?Goal: Home and Family Safety Maintained   ?This Visit's Progress: On track  ?Recent Progress: On track  ?Priority: Medium  ?Note:   ?Current Barriers:  ?Limited social support and Limited access to caregiver ?Suicidal Ideation/Homicidal Ideation: No ? ?Clinical Social Work Goal(s):  ?Over the next 90 days, patient will work with SW bi-weekly by  telephone or in person to develop a plan for additional in home asisstance ?explore community resource options for unmet needs related [id number] in home care  ? ?Interventions: ?SDOH Interventions   ? ?Flowsheet Row Most Recent Value  ?SDOH Interventions   ?SDOH Interventions for the Following Domains Stress  ?Stress Interventions --  [assistance to be provided to assist with in home care]  ? ?  ?Patient continues to confirm that she has a private duty aid to assist with her ADl's-however aid only coming 1 day per week ?Confirmed that [facility] services will now resume--PT coming tomorrow ?Patient confirmed trip to see her son went well and has helped with her  motivation to take medication regularly-patient's family supportive and assisted with medication reminders ?Benefits of medication adherence discussed-confirms assistance and support from family helped with adherence as she plans to continue the practice of taking her medication regularly ?Supportive counseling provided regarding medication adherence ?Self care and safety emphasized ?Assessed for additional community resource needs-patient confirmed no additional needs at this time. ?05/09/21 Return phone call to patient by request to discuss options for in  home care. Per patient, her previous aid is no longer providing assistance and patient is now needing additional assistance with light housekeeping and assistance with ADL's. ?Options for additional in home care explored ?Provided patient with resources for in home care that do not require a minimum: [facility]-[phone], [place], [place] care [phone] ?Patient agreeable to discussing options discussed with her son to arrange in home care ?05/22/21 Follow up phone call to patient regarding in home care needs. Per patient, she has followed up with  [facility] home assessment completed- in home care arranged for 3 days per week Mondays, Wednesdays, Fridays for  4 hours per visit. In home aid to start on 05/24/21. Patient's son to assist with costs. Patient confirmed having no additional community resource needs at this time. ?Patient encouraged to call this social

## 2021-05-22 NOTE — Patient Instructions (Signed)
Visit Information ? ?Thank you for taking time to visit with me today. Please don't hesitate to contact me if I can be of assistance to you before our next scheduled telephone appointment. ? ?Following are the goals we discussed today:  ? ?- continue to follow with [place]  ?- think ahead to make sure my need does not become an emergency ?- have a back-up plan ?- make a list of family or friends that I can call  ?-discuss medication adherence with your provider  ? ?Please call the care guide team at [phone] if you need to cancel or reschedule your appointment.  ? ?If you are experiencing a Mental Health or [place] or need someone to talk to, please call the Suicide and Crisis Lifeline: 988  ? ?Patient verbalizes understanding of instructions and care plan provided today and agrees to view in [place]. Active MyChart status confirmed with patient.   ? ?No further follow up required: patient to contact this Education officer, museum with any additional community resource needs ? ?Nahun Kronberg, LCSW ?Clinical Social Worker  ?[facility] ?[phone] ? ?

## 2021-05-26 ENCOUNTER — Other Ambulatory Visit: Payer: Self-pay | Admitting: Family Medicine

## 2021-05-26 DIAGNOSIS — E7800 Pure hypercholesterolemia, unspecified: Secondary | ICD-10-CM

## 2021-06-26 ENCOUNTER — Other Ambulatory Visit: Payer: Self-pay | Admitting: Family Medicine

## 2021-06-26 DIAGNOSIS — E782 Mixed hyperlipidemia: Secondary | ICD-10-CM

## 2021-06-27 ENCOUNTER — Other Ambulatory Visit: Payer: Self-pay

## 2021-06-27 ENCOUNTER — Ambulatory Visit: Payer: Medicare Other | Attending: Anesthesiology | Admitting: Anesthesiology

## 2021-06-27 ENCOUNTER — Encounter: Payer: Self-pay | Admitting: Anesthesiology

## 2021-06-27 VITALS — BP 160/67 | HR 71 | Temp 97.1°F | Resp 18 | Ht 60.0 in | Wt 182.0 lb

## 2021-06-27 DIAGNOSIS — G8929 Other chronic pain: Secondary | ICD-10-CM | POA: Diagnosis present

## 2021-06-27 DIAGNOSIS — M25561 Pain in right knee: Secondary | ICD-10-CM | POA: Insufficient documentation

## 2021-06-27 DIAGNOSIS — M79642 Pain in left hand: Secondary | ICD-10-CM | POA: Diagnosis present

## 2021-06-27 DIAGNOSIS — M25562 Pain in left knee: Secondary | ICD-10-CM | POA: Insufficient documentation

## 2021-06-27 DIAGNOSIS — Z79891 Long term (current) use of opiate analgesic: Secondary | ICD-10-CM | POA: Insufficient documentation

## 2021-06-27 DIAGNOSIS — M159 Polyosteoarthritis, unspecified: Secondary | ICD-10-CM | POA: Insufficient documentation

## 2021-06-27 DIAGNOSIS — G894 Chronic pain syndrome: Secondary | ICD-10-CM | POA: Diagnosis present

## 2021-06-27 DIAGNOSIS — M545 Low back pain, unspecified: Secondary | ICD-10-CM | POA: Diagnosis present

## 2021-06-27 DIAGNOSIS — M79641 Pain in right hand: Secondary | ICD-10-CM | POA: Insufficient documentation

## 2021-06-27 MED ORDER — OXYCODONE-ACETAMINOPHEN 7.5-325 MG PO TABS
1.0000 | ORAL_TABLET | Freq: Three times a day (TID) | ORAL | 0 refills | Status: DC | PRN
Start: 2021-08-12 — End: 2021-08-18

## 2021-06-27 MED ORDER — OXYCODONE-ACETAMINOPHEN 7.5-325 MG PO TABS: 1.0000 | ORAL_TABLET | Freq: Three times a day (TID) | ORAL | 0 refills | Status: AC | PRN

## 2021-06-27 NOTE — Progress Notes (Signed)
Nursing Pain Medication Assessment:  ?Safety precautions to be maintained throughout the outpatient stay will include: orient to surroundings, keep bed in low position, maintain call bell within reach at all times, provide assistance with transfer out of bed and ambulation.  ?Medication Inspection Compliance: Pill count conducted under aseptic conditions, in front of the patient. Neither the pills nor the bottle was removed from the patient's sight at any time. Once count was completed pills were immediately returned to the patient in their original bottle. ? ?Medication: Oxycodone/APAP ?Pill/Patch Count:  68 of 90 pills remain ?Pill/Patch Appearance: Markings consistent with prescribed medication ?Bottle Appearance: Standard pharmacy container. Clearly labeled. ?Filled Date: 04 / 26 / 2023 ?Last Medication intake:  Today ?

## 2021-06-28 NOTE — Progress Notes (Signed)
? ?Subjective:  ?Patient ID: Taylor [name], female    DOB: [birth year]  Age: 88 y.o. MRN: [id number] ? ?CC: Back Pain (low), Knee Pain (bilateral), and Hand Pain (bilateral) ? ? ?Procedure: None ? ?HPI ?Taylor [name] presents for reevaluation.  We last spoke 2 months ago and she states that her low back pain hip pain and lower leg pain is stable.  She is taking her medications as prescribed and continues to do well with these.  No side effects with her opioid medications as noted.  She continues to get good relief and she rates this at approximately 75% improvement when she takes the medication lasting about 4 to 6 hours before she has recurrence of the same baseline pain.  With her medication she is inactive and cannot sleep at night.  Otherwise she is in her usual state of health with no change in the quality characteristic or distribution of her low back pain. ? ? ? ?Outpatient Medications Prior to Visit  ?Medication Sig Dispense Refill  ? acetaminophen (TYLENOL) 500 MG tablet Take 1 tablet (500 mg total) by mouth 3 (three) times daily. (Patient taking differently: Take 500 mg by mouth every 6 (six) hours as needed for moderate pain or headache.) 90 tablet 0  ? amLODipine (NORVASC) 5 MG tablet Take 1 tablet (5 mg total) by mouth daily. 90 tablet 1  ? brimonidine-timolol (COMBIGAN) 0.2-0.5 % ophthalmic solution Place 1 drop into both eyes 2 (two) times daily.     ? Calcium Carbonate-Vitamin D (OYSTER SHELL CALCIUM 500 + D PO) Take 1 tablet by mouth 2 (two) times a day.    ? denosumab (PROLIA) 60 MG/ML SOSY injection Inject into the skin.    ? Docusate Sodium 100 MG capsule Take 100 mg by mouth 2 (two) times daily.    ? dorzolamide (TRUSOPT) 2 % ophthalmic solution 1 drop 2 (two) times daily.    ? famotidine (PEPCID) 20 MG tablet TAKE 1 TABLET BY MOUTH EVERY DAY 90 tablet 1  ? ferrous sulfate 325 (65 FE) MG tablet TAKE 1 TABLET BY MOUTH EVERY DAY WITH BREAKFAST 90 tablet 0  ? fluticasone (FLONASE) 50 MCG/ACT nasal  spray SPRAY 2 SPRAYS INTO EACH NOSTRIL EVERY DAY 48 mL 1  ? lactulose (CHRONULAC) 10 GM/15ML solution Take 30 mLs (20 g total) by mouth daily as needed for mild constipation. 120 mL 0  ? Lancets (ONETOUCH DELICA PLUS [id number]) MISC 1 each by Does not apply route daily. 100 each 2  ? lubiprostone (AMITIZA) 24 MCG capsule TAKE 1 CAPSULE (24 MCG TOTAL) BY MOUTH 2 (TWO) TIMES DAILY WITH A MEAL. 180 capsule 1  ? metFORMIN (GLUCOPHAGE-XR) 750 MG 24 hr tablet TAKE 2 TABLETS (1,500 MG TOTAL) BY MOUTH DAILY WITH BREAKFAST. 180 tablet 1  ? olmesartan (BENICAR) 20 MG tablet TAKE 1 TABLET BY MOUTH EVERY DAY 90 tablet 0  ? omega-3 acid ethyl esters (LOVAZA) 1 g capsule TAKE 1 CAPSULE BY MOUTH TWICE A DAY 180 capsule 0  ? ONETOUCH ULTRA test strip USE AS DIRECTED 100 strip 12  ? pravastatin (PRAVACHOL) 40 MG tablet TAKE 1 TABLET BY MOUTH EVERYDAY AT BEDTIME 90 tablet 0  ? ROCKLATAN 0.02-0.005 % SOLN Apply 1 drop to eye at bedtime.    ? Vitamin D, Ergocalciferol, (DRISDOL) 1.25 MG (50000 UNIT) CAPS capsule Take 50,000 Units by mouth every 7 (seven) days.    ? oxyCODONE-acetaminophen (PERCOCET) 7.5-325 MG tablet Take 1 tablet by mouth every 8 (eight) hours  as needed for moderate pain or severe pain. 90 tablet 0  ? acetaminophen (TYLENOL) 500 MG tablet Take by mouth. (Patient not taking: Reported on 06/27/2021)    ? ?No facility-administered medications prior to visit.  ? ? ?Review of Systems ?CNS: No confusion or sedation ?Cardiac: No angina or palpitations ?GI: No abdominal pain or constipation ?Constitutional: No nausea vomiting fevers or chills ? ?Objective:  ?BP (!) 160/67   Pulse 71   Temp (!) 97.1 ?F (36.2 ?C) (Temporal)   Resp 18   Ht 5' (1.524 m)   Wt 182 lb (82.6 kg)   SpO2 100%   BMI 35.54 kg/m?  ? ? ?BP Readings from Last 3 Encounters:  ?06/27/21 (!) 160/67  ?03/24/21 134/78  ?12/16/20 108/62  ? ? ? ?Wt Readings from Last 3 Encounters:  ?06/27/21 182 lb (82.6 kg)  ?03/24/21 192 lb (87.1 kg)  ?12/16/20 191 lb (86.6  kg)  ? ? ? ?Physical Exam ?Pt is alert and oriented ?PERRL EOMI ?HEART IS RRR no murmur or rub ?LCTA no wheezing or rales ?MUSCULOSKELETAL reveals some paraspinous muscle tenderness but no overt trigger points.  Her muscle tone and bulk is good she ambulates with assistance ? ?Labs ? ?Lab Results  ?Component Value Date  ? HGBA1C 6.4 (A) 03/24/2021  ? HGBA1C 6.9 (A) 12/16/2020  ? HGBA1C 6.8 (A) 08/17/2020  ? ?Lab Results  ?Component Value Date  ? MICROALBUR 9.6 03/24/2021  ? [place] 53 03/24/2021  ? CREATININE 1.33 (H) 03/24/2021  ? ? ?-------------------------------------------------------------------------------------------------------------------- ?Lab Results  ?Component Value Date  ? WBC 11.8 (H) 11/29/2020  ? HGB 13.0 11/29/2020  ? HCT 38.0 11/29/2020  ? PLT 331 11/29/2020  ? GLUCOSE 120 (H) 03/24/2021  ? CHOL 120 03/24/2021  ? TRIG 117 03/24/2021  ? HDL 47 (L) 03/24/2021  ? [place] 53 03/24/2021  ? ALT 5 (L) 03/24/2021  ? AST 10 03/24/2021  ? NA 142 03/24/2021  ? K 4.5 03/24/2021  ? CL 110 03/24/2021  ? CREATININE 1.33 (H) 03/24/2021  ? BUN 21 03/24/2021  ? CO2 24 03/24/2021  ? TSH 0.63 04/17/2019  ? HGBA1C 6.4 (A) 03/24/2021  ? MICROALBUR 9.6 03/24/2021  ? ? ?--------------------------------------------------------------------------------------------------------------------- ?DG Abdomen 1 View ? ?Result Date: 11/29/2020 ?CLINICAL DATA:  Constipation. EXAM: ABDOMEN - 1 VIEW COMPARISON:  None. FINDINGS: Stool-filled colon. Gas within some small bowel in the mid abdomen. No small or large bowel distention. No radiopaque stones. Vascular calcifications. Postoperative fixation of the lumbosacral spine. Mild lumbar scoliosis convex towards the right. Surgical clips in the right upper quadrant. IMPRESSION: Nonobstructive bowel gas pattern with diffusely stool-filled colon compatible with the history of constipation. Electronically Signed   By: Lucienne Capers M.D.   On: 11/29/2020 22:05  ? ? ? ?Assessment & Plan:   ? ?Taylor [name] was seen today for back pain, knee pain and hand pain. ? ?Diagnoses and all orders for this visit: ? ?Chronic pain syndrome ?-     ToxASSURE Select 13 (MW), Urine ? ?Long term prescription opiate use ?-     ToxASSURE Select 13 (MW), Urine ? ?Osteoarthritis of multiple joints, unspecified osteoarthritis type ? ?Chronic pain of right knee ? ?Chronic midline low back pain without sciatica ? ?Pain in both hands ? ?Chronic pain of both knees ? ?Other orders ?-     oxyCODONE-acetaminophen (PERCOCET) 7.5-325 MG tablet; Take 1 tablet by mouth every 8 (eight) hours as needed for moderate pain or severe pain. ?-     oxyCODONE-acetaminophen (PERCOCET)  7.5-325 MG tablet; Take 1 tablet by mouth every 8 (eight) hours as needed for moderate pain or severe pain. ? ? ?   ? ? ?---------------------------------------------------------------------------------------------------------------------- ? ?Problem List Items Addressed This Visit   ? ?  ? Unprioritized  ? Chronic midline low back pain without sciatica (Chronic)  ? Relevant Medications  ? oxyCODONE-acetaminophen (PERCOCET) 7.5-325 MG tablet (Start on 07/14/2021)  ? oxyCODONE-acetaminophen (PERCOCET) 7.5-325 MG tablet (Start on 08/12/2021)  ? Chronic pain of right knee (Chronic)  ? Chronic pain syndrome - Primary (Chronic)  ? Relevant Medications  ? oxyCODONE-acetaminophen (PERCOCET) 7.5-325 MG tablet (Start on 07/14/2021)  ? oxyCODONE-acetaminophen (PERCOCET) 7.5-325 MG tablet (Start on 08/12/2021)  ? Other Relevant Orders  ? ToxASSURE Select 13 (MW), Urine  ? Osteoarthritis, multiple sites (Chronic)  ? Relevant Medications  ? oxyCODONE-acetaminophen (PERCOCET) 7.5-325 MG tablet (Start on 07/14/2021)  ? oxyCODONE-acetaminophen (PERCOCET) 7.5-325 MG tablet (Start on 08/12/2021)  ? Pain in both hands (Chronic)  ? ?Other Visit Diagnoses   ? ? Long term prescription opiate use      ? Relevant Orders  ? ToxASSURE Select 13 (MW), Urine  ? Chronic pain of both knees      ? Relevant  Medications  ? oxyCODONE-acetaminophen (PERCOCET) 7.5-325 MG tablet (Start on 07/14/2021)  ? oxyCODONE-acetaminophen (PERCOCET) 7.5-325 MG tablet (Start on 08/12/2021)  ? ?  ? ? ? ? ?-------------------------

## 2021-06-29 ENCOUNTER — Telehealth: Payer: Self-pay

## 2021-06-29 NOTE — Progress Notes (Signed)
Chronic Care Management Pharmacy Assistant   Name: Taylor [name]  MRN: [id number] DOB: 11-21-1932  Reason for Encounter: General Check-in Call   Recent office visits:  04/19/2021 [name], LPN (PCP Clinical Support) for Medicare Wellness Exam-Stopped: Calcium 250 mg, Calcium Carb-Cholecalciferol 500-10 mg, Cephalexin 250 mg, No orders placed, No follow-up noted  Recent consult visits:  06/27/2021 [name], MD (Pain Management) for Chronic Pain Syndrome- No medication changes noted, Lab order placed, Patient to follow-up in 2 months  06/22/2021 [name], MD (Nephrology) for Follow-up- No medication changes noted, No orders placed, patient to follow-up in 6 months  05/10/2021 [name], MD (Pain Management) for Chronic Pain Syndrome- No medication changes noted, Lab order placed, Patient to follow-up in 2 months  Hospital visits:  None in previous 6 months  Medications: Outpatient Encounter Medications as of 06/29/2021  Medication Sig   acetaminophen (TYLENOL) 500 MG tablet Take 1 tablet (500 mg total) by mouth 3 (three) times daily. (Patient taking differently: Take 500 mg by mouth every 6 (six) hours as needed for moderate pain or headache.)   acetaminophen (TYLENOL) 500 MG tablet Take by mouth. (Patient not taking: Reported on 06/27/2021)   amLODipine (NORVASC) 5 MG tablet Take 1 tablet (5 mg total) by mouth daily.   brimonidine-timolol (COMBIGAN) 0.2-0.5 % ophthalmic solution Place 1 drop into both eyes 2 (two) times daily.    Calcium Carbonate-Vitamin D (OYSTER SHELL CALCIUM 500 + D PO) Take 1 tablet by mouth 2 (two) times a day.   denosumab (PROLIA) 60 MG/ML SOSY injection Inject into the skin.   Docusate Sodium 100 MG capsule Take 100 mg by mouth 2 (two) times daily.   dorzolamide (TRUSOPT) 2 % ophthalmic solution 1 drop 2 (two) times daily.   famotidine (PEPCID) 20 MG tablet TAKE 1 TABLET BY MOUTH EVERY DAY   ferrous sulfate 325 (65 FE) MG tablet TAKE 1 TABLET BY  MOUTH EVERY DAY WITH BREAKFAST   fluticasone (FLONASE) 50 MCG/ACT nasal spray SPRAY 2 SPRAYS INTO EACH NOSTRIL EVERY DAY   lactulose (CHRONULAC) 10 GM/15ML solution Take 30 mLs (20 g total) by mouth daily as needed for mild constipation.   Lancets (ONETOUCH DELICA PLUS [id number]) MISC 1 each by Does not apply route daily.   lubiprostone (AMITIZA) 24 MCG capsule TAKE 1 CAPSULE (24 MCG TOTAL) BY MOUTH 2 (TWO) TIMES DAILY WITH A MEAL.   metFORMIN (GLUCOPHAGE-XR) 750 MG 24 hr tablet TAKE 2 TABLETS (1,500 MG TOTAL) BY MOUTH DAILY WITH BREAKFAST.   olmesartan (BENICAR) 20 MG tablet TAKE 1 TABLET BY MOUTH EVERY DAY   omega-3 acid ethyl esters (LOVAZA) 1 g capsule TAKE 1 CAPSULE BY MOUTH TWICE A DAY   ONETOUCH ULTRA test strip USE AS DIRECTED   [START ON 07/14/2021] oxyCODONE-acetaminophen (PERCOCET) 7.5-325 MG tablet Take 1 tablet by mouth every 8 (eight) hours as needed for moderate pain or severe pain.   [START ON 08/12/2021] oxyCODONE-acetaminophen (PERCOCET) 7.5-325 MG tablet Take 1 tablet by mouth every 8 (eight) hours as needed for moderate pain or severe pain.   pravastatin (PRAVACHOL) 40 MG tablet TAKE 1 TABLET BY MOUTH EVERYDAY AT BEDTIME   ROCKLATAN 0.02-0.005 % SOLN Apply 1 drop to eye at bedtime.   Vitamin D, Ergocalciferol, (DRISDOL) 1.25 MG (50000 UNIT) CAPS capsule Take 50,000 Units by mouth every 7 (seven) days.   No facility-administered encounter medications on file as of 06/29/2021.   Care Gaps: [place] Vaccine COVID-19 Vaccine Booster 3  Star Rating  Drugs: Olmesartan 20 mg last filled on 06/25/2021 for a 90-Day supply with [facility] Pravastatin 40 mg last filled on 05/26/2021 for a 90-Day supply with [facility] Metformin 750 mg last filled on 04/05/2021 for a 90-Day supply with [facility]  General Call  I spoke with the patient and she reports that today she is doing pretty good. The patient denies any ill symptoms at this time. Per patient she was getting dressed to go  out of town to spend Mother's Day weekend with her daughter.  The patient's reports that she is now getting assistance in home 3 days a week and she reports that this is helping her a lot. Patient stated her blood pressure and blood sugar numbers are doing pretty good. She did advise she saw her pain management specialist a few days ago and her blood pressure was a little elevated but no issues the last few days. Patient stated she can't remember her most recent number but she does know it was normal.   Patient reports that she is taking all her medications with no issues, and she denies needing any refills today. Patient has  no other concerns or issues at this time, and encouraged to give me a call if she needs anything.  Patient has a follow-up telephone appointment with Junius Argyle, CPP on 07/05 @ 1545.  Lynann Bologna, CPA/CMA Clinical Pharmacist Assistant Phone: [phone]

## 2021-07-03 LAB — TOXASSURE SELECT 13 (MW), URINE

## 2021-07-30 ENCOUNTER — Other Ambulatory Visit: Payer: Self-pay | Admitting: Family Medicine

## 2021-07-30 DIAGNOSIS — R790 Abnormal level of blood mineral: Secondary | ICD-10-CM

## 2021-07-30 DIAGNOSIS — D649 Anemia, unspecified: Secondary | ICD-10-CM

## 2021-07-31 ENCOUNTER — Telehealth: Payer: Self-pay | Admitting: Anesthesiology

## 2021-07-31 NOTE — Telephone Encounter (Signed)
Patient stated that she has call several pharmacy ,all pharmacy are out of the meds that she need. Please give patient a call. Thanks

## 2021-07-31 NOTE — Telephone Encounter (Signed)
Attemted to call patient twice, after several rings, the message stated, "your call cannot be completed as dialed."

## 2021-08-01 NOTE — Telephone Encounter (Signed)
Spoke with patient, instructed her to call other pharmacies to find availability of the medications.

## 2021-08-04 ENCOUNTER — Ambulatory Visit: Payer: Medicare Other | Admitting: Family Medicine

## 2021-08-09 ENCOUNTER — Encounter (INDEPENDENT_AMBULATORY_CARE_PROVIDER_SITE_OTHER): Payer: Medicare Other | Admitting: Ophthalmology

## 2021-08-18 ENCOUNTER — Ambulatory Visit: Payer: Medicare Other | Attending: Anesthesiology | Admitting: Anesthesiology

## 2021-08-18 ENCOUNTER — Encounter: Payer: Self-pay | Admitting: Anesthesiology

## 2021-08-18 DIAGNOSIS — M25561 Pain in right knee: Secondary | ICD-10-CM

## 2021-08-18 DIAGNOSIS — Z79891 Long term (current) use of opiate analgesic: Secondary | ICD-10-CM | POA: Diagnosis not present

## 2021-08-18 DIAGNOSIS — M545 Low back pain, unspecified: Secondary | ICD-10-CM

## 2021-08-18 DIAGNOSIS — G894 Chronic pain syndrome: Secondary | ICD-10-CM | POA: Diagnosis not present

## 2021-08-18 DIAGNOSIS — M159 Polyosteoarthritis, unspecified: Secondary | ICD-10-CM | POA: Diagnosis not present

## 2021-08-18 DIAGNOSIS — M546 Pain in thoracic spine: Secondary | ICD-10-CM

## 2021-08-18 DIAGNOSIS — M79642 Pain in left hand: Secondary | ICD-10-CM

## 2021-08-18 DIAGNOSIS — G8929 Other chronic pain: Secondary | ICD-10-CM

## 2021-08-18 DIAGNOSIS — M25562 Pain in left knee: Secondary | ICD-10-CM

## 2021-08-18 DIAGNOSIS — M79641 Pain in right hand: Secondary | ICD-10-CM

## 2021-08-18 MED ORDER — OXYCODONE-ACETAMINOPHEN 7.5-325 MG PO TABS: 1.0000 | ORAL_TABLET | Freq: Three times a day (TID) | ORAL | 0 refills | Status: AC | PRN

## 2021-08-21 ENCOUNTER — Telehealth: Payer: Self-pay

## 2021-08-21 NOTE — Progress Notes (Signed)
    Chronic Care Management Pharmacy Assistant   Name: Taylor [name]  MRN: [id number] DOB: 03/18/1932  Patient called to be reminded of her telephone appointment with Junius Argyle, CPP on 08/23/2021 '@1545'$   No answer, left message of appointment date, time and type of appointment (either telephone or in person). Left message to have all medications, supplements, blood pressure and/or blood sugar logs available during appointment and to return call if need to reschedule.  Star Rating Drug: Olmesartan 20 mg last filled on 06/25/2021 for a 90-Day supply with [facility] Pravastatin 40 mg last filled on 05/26/2021 for a 90-Day supply with [facility] Metformin 750 mg last filled on 07/21/2021 for a 90-Day supply with [facility]  Any gaps in medications fill history? None  Care Gaps: Z[place] Vaccine COVID-19 Vaccine Booster [place], CPA/CMA Clinical Pharmacist Assistant Phone: [phone]

## 2021-08-22 ENCOUNTER — Other Ambulatory Visit: Payer: Self-pay | Admitting: Family Medicine

## 2021-08-22 DIAGNOSIS — E7800 Pure hypercholesterolemia, unspecified: Secondary | ICD-10-CM

## 2021-08-22 DIAGNOSIS — K219 Gastro-esophageal reflux disease without esophagitis: Secondary | ICD-10-CM

## 2021-08-23 ENCOUNTER — Ambulatory Visit (INDEPENDENT_AMBULATORY_CARE_PROVIDER_SITE_OTHER): Payer: Medicare Other

## 2021-08-23 DIAGNOSIS — I129 Hypertensive chronic kidney disease with stage 1 through stage 4 chronic kidney disease, or unspecified chronic kidney disease: Secondary | ICD-10-CM

## 2021-08-23 DIAGNOSIS — E1129 Type 2 diabetes mellitus with other diabetic kidney complication: Secondary | ICD-10-CM

## 2021-08-23 NOTE — Progress Notes (Signed)
Chronic Care Management Pharmacy Note  08/25/2021 Name:  Taylor [name] MRN:  [id number] DOB:  1933-01-05  Summary: Patient presents for CCM follow-up.    Recommendations/Changes made from today's visit: Continue current medications  Plan: CPP follow-up 6 months  Subjective: Taylor [name] is an 88 y.o. year old female who is a primary patient of [name], MD.  The CCM team was consulted for assistance with disease management and care coordination needs.    Engaged with patient by telephone for follow up visit in response to provider referral for pharmacy case management and/or care coordination services.   Consent to Services:  The patient was given information about Chronic Care Management services, agreed to services, and gave verbal consent prior to initiation of services.  Please see initial visit note for detailed documentation.   Patient Care Team: [name], MD as PCP - General (Family Medicine) Andree Elk Alvina Filbert, MD as Consulting Physician (Anesthesiology) [name], DPM as Consulting Physician (Podiatry) [name], MD (Nephrology) Germaine Pomfret, [facility] (Pharmacist)  Recent office visits: 03/24/21: Patient presented to Dr. Ancil Boozer for follow-up. eGFR 39, Microalbuminuria, A1c 6.4%.   Recent consult visits: 04/17/21: Patient presented to Dr. Cleda Mccreedy (Podiatry) for follow-up.  03/23/21: Patient presented to Dr. Andree Elk for chronic pain.   Hospital visits: None in past 6 months   Objective:  Lab Results  Component Value Date   CREATININE 1.33 (H) 03/24/2021   BUN 21 03/24/2021   GFRNONAA 42 (L) 11/29/2020   GFRAA 42 (L) 12/28/2019   NA 142 03/24/2021   K 4.5 03/24/2021   CALCIUM 9.7 03/24/2021   CO2 24 03/24/2021    Lab Results  Component Value Date/Time   HGBA1C 6.4 (A) 03/24/2021 02:20 PM   HGBA1C 6.9 (A) 12/16/2020 02:13 PM   HGBA1C 6.5 04/17/2019 02:08 PM   HGBA1C 6.6 08/13/2018 02:51 PM   HGBA1C 7.8 (A) 08/05/2017 11:30 AM    MICROALBUR 9.6 03/24/2021 03:02 PM   MICROALBUR 1.8 04/17/2019 03:12 PM   MICROALBUR 50 08/05/2017 11:28 AM   MICROALBUR 100 12/12/2015 12:21 PM    Last diabetic Eye exam:  Lab Results  Component Value Date/Time   HMDIABEYEEXA No Retinopathy 06/03/2017 12:00 AM    Last diabetic Foot exam: No results found for: "HMDIABFOOTEX"   Lab Results  Component Value Date   CHOL 120 03/24/2021   HDL 47 (L) 03/24/2021   LDLCALC 53 03/24/2021   TRIG 117 03/24/2021   CHOLHDL 2.6 03/24/2021       Latest Ref Rng & Units 03/24/2021    3:02 PM 11/29/2020   10:17 PM 12/28/2019    2:00 PM  Hepatic Function  Total Protein 6.1 - 8.1 g/dL 6.6  7.1  6.5   Albumin 3.5 - 5.0 g/dL  4.1    AST 10 - 35 U/L _0 ALT 6 - 29 U/L _1 Alk Phosphatase 38 - 126 U/L  41    Total Bilirubin 0.2 - 1.2 mg/dL 0.5  0.5  0.3     Lab Results  Component Value Date/Time   TSH 0.63 04/17/2019 03:12 PM       Latest Ref Rng & Units 11/29/2020   10:17 PM 09/05/2020    7:48 PM 04/17/2019    3:12 PM  CBC  WBC 4.0 - 10.5 K/uL 11.8  6.6  7.3   Hemoglobin 12.0 - 15.0 g/dL 13.0  12.4  12.1   Hematocrit  36.0 - 46.0 % 38.0  36.9  37.1   Platelets 150 - 400 K/uL 331  297  332     Lab Results  Component Value Date/Time   VD25OH 29 (L) 08/05/2017 12:08 PM   VD25OH 16 (L) 08/14/2016 10:12 AM    Clinical ASCVD: Yes  The ASCVD Risk score (Arnett DK, et al., 2019) failed to calculate for the following reasons:   The 2019 ASCVD risk score is only valid for ages 19 to 14       06/27/2021    3:07 PM 05/09/2021    2:56 PM 03/24/2021    2:09 PM  Depression screen PHQ 2/9  Decreased Interest 0 0 0  Down, Depressed, Hopeless 0 0 0  PHQ - 2 Score 0 0 0  Altered sleeping   0  Tired, decreased energy   0  Change in appetite   0  Feeling bad or failure about yourself    0  Trouble concentrating   0  Moving slowly or fidgety/restless   0  Suicidal thoughts   0  PHQ-9 Score   0    -Last DEXA Scan: 04/30/2017    T-Score femoral neck: -3.3  T-Score total hip: -1.9  T-Score lumbar spine: NA  T-Score forearm radius: -2.3  10-year probability of major osteoporotic fracture: NA  10-year probability of hip fracture: NA  Social History   Tobacco Use  Smoking Status Never  Smokeless Tobacco Never   BP Readings from Last 3 Encounters:  06/27/21 (!) 160/67  03/24/21 134/78  12/16/20 108/62   Pulse Readings from Last 3 Encounters:  06/27/21 71  03/24/21 79  12/16/20 77   Wt Readings from Last 3 Encounters:  06/27/21 182 lb (82.6 kg)  03/24/21 192 lb (87.1 kg)  12/16/20 191 lb (86.6 kg)    Assessment/Interventions: Review of patient past medical history, allergies, medications, health status, including review of consultants reports, laboratory and other test data, was performed as part of comprehensive evaluation and provision of chronic care management services.   SDOH:  (Social Determinants of Health) assessments and interventions performed: Yes      CCM Care Plan  Allergies  Allergen Reactions   Ace Inhibitors Cough    Medications Reviewed Today     Reviewed by [name], MD (Physician) on 08/18/21 at 1358  Med List Status: <None>   Medication Order Taking? Sig Documenting Provider Last Dose Status Informant  acetaminophen (TYLENOL) 500 MG tablet [id number] No Take 1 tablet (500 mg total) by mouth 3 (three) times daily.  Patient taking differently: Take 500 mg by mouth every 6 (six) hours as needed for moderate pain or headache.   [name], MD Taking Active   acetaminophen (TYLENOL) 500 MG tablet [id number] No Take by mouth.  Patient not taking: Reported on 06/27/2021   [provider] Not Taking Active   amLODipine (NORVASC) 5 MG tablet [id number] No Take 1 tablet (5 mg total) by mouth daily. [name], MD Taking Active   brimonidine-timolol (COMBIGAN) 0.2-0.5 % ophthalmic solution [id number] No Place 1 drop into both eyes 2 (two) times daily.   [provider] Taking Active Self  Calcium Carbonate-Vitamin D (OYSTER SHELL CALCIUM 500 + D PO) [id number] No Take 1 tablet by mouth 2 (two) times a day. [provider] Taking Active Self  denosumab (PROLIA) 60 MG/ML SOSY injection [id number] No Inject into the skin. [provider] Taking Active   Docusate Sodium 100 MG capsule [id number] No  Take 100 mg by mouth 2 (two) times daily. [provider] Taking Active   dorzolamide (TRUSOPT) 2 % ophthalmic solution [id number] No 1 drop 2 (two) times daily. [provider] Taking Active   famotidine (PEPCID) 20 MG tablet [id number] No TAKE 1 TABLET BY MOUTH EVERY DAY Sowles, Drue Stager, MD Taking Active   ferrous sulfate 325 (65 FE) MG tablet [id number]  TAKE 1 TABLET BY MOUTH EVERY DAY WITH [place], Drue Stager, MD  Active   fluticasone (FLONASE) 50 MCG/ACT nasal spray [id number] No SPRAY 2 SPRAYS INTO EACH NOSTRIL EVERY DAY [name], MD Taking Active   lactulose (CHRONULAC) 10 GM/15ML solution [id number] No Take 30 mLs (20 g total) by mouth daily as needed for mild constipation. [name], MD Taking Active   Lancets (ONETOUCH DELICA PLUS WLSLHT34K) [place] [id number] No 1 each by Does not apply route daily. [name], MD Taking Active   lubiprostone (AMITIZA) 24 MCG capsule [id number] No TAKE 1 CAPSULE (24 MCG TOTAL) BY MOUTH 2 (TWO) TIMES DAILY WITH A MEAL. [name], MD Taking Active   metFORMIN (GLUCOPHAGE-XR) 750 MG 24 hr tablet [id number] No TAKE 2 TABLETS (1,500 MG TOTAL) BY MOUTH DAILY WITH BREAKFAST. [name], MD Taking Active   olmesartan (BENICAR) 20 MG tablet [id number] No TAKE 1 TABLET BY MOUTH EVERY DAY Sowles, Drue Stager, MD Taking Active   omega-3 acid ethyl esters (LOVAZA) 1 g capsule [id number] No TAKE 1 CAPSULE BY MOUTH TWICE A Eloise Harman, MD Taking Active   [facility] ULTRA test strip [id number] No USE AS DIRECTED [name], MD Taking Active    oxyCODONE-acetaminophen (PERCOCET) 7.5-325 MG tablet [id number]  Take 1 tablet by mouth every 8 (eight) hours as needed for moderate pain or severe pain. [name], MD  Active   oxyCODONE-acetaminophen (PERCOCET) 7.5-325 MG tablet [id number] Yes Take 1 tablet by mouth every 8 (eight) hours as needed for moderate pain or severe pain. [name], MD  Active   pravastatin (PRAVACHOL) 40 MG tablet [id number] No TAKE 1 TABLET BY MOUTH EVERYDAY AT BEDTIME [name], MD Taking Active   ROCKLATAN 0.02-0.005 % SOLN [id number] No Apply 1 drop to eye at bedtime. [provider] Taking Active   Vitamin D, Ergocalciferol, (DRISDOL) 1.25 MG (50000 UNIT) CAPS capsule [id number] No Take 50,000 Units by mouth every 7 (seven) days. [provider] Taking Active   Med List Note Hart Rochester, RN 06/27/21 1508): UDS 06/27/2021             Patient Active Problem List   Diagnosis Date Noted   Closed compression fracture of body of L1 vertebra ([place]) 09/23/2020   Gastric polyp    Polyp of colon    Morbid obesity ([place]) 04/08/2018   Age-related osteoporosis without current pathological fracture 09/26/2017   Pain in both hands 09/23/2017   Hyperparathyroidism ([place]) 08/06/2017   Chronic kidney disease, stage III (moderate) ([place]) 08/06/2017   HLD (hyperlipidemia) 07/25/2017   Osteoporotic compression fracture of spine ([place]) 03/22/2017   Assistance needed with transportation 03/21/2017   Iron deficiency anemia 12/19/2016   Aortic atherosclerosis ([place]) 11/23/2016   Anterolisthesis 11/23/2016   Chronic pain syndrome 09/13/2016   Vitamin D deficiency 08/19/2016   Anemia, unspecified 08/19/2016   Primary open angle glaucoma (POAG) of both eyes, severe stage 08/17/2016   Hypertensive retinopathy 08/17/2016   Long term current use of opiate analgesic 07/19/2016   Dyslipidemia associated with type 2 diabetes mellitus ([place]) 04/16/2016   Chronic midline low  back pain without  sciatica 04/16/2016   Chronic pain of right knee 04/16/2016   Vaginal dryness 02/22/2015   Calculus of kidney 11/10/2014   Osteoarthritis, multiple sites 09/09/2014   Type 2 diabetes mellitus with microalbuminuria ([place]) 09/09/2014   Glaucoma 09/09/2014   Benign hypertension with CKD (chronic kidney disease) stage III ([place]) 09/09/2014   Chronic pain 09/09/2014   Tinea corporis 14/43/1540   Umbilical hernia 08/67/6195   Mass of right breast     Immunization History  Administered Date(s) Administered   Fluad Quad(high Dose 65+) 12/15/2018, 12/28/2019, 12/16/2020   Influenza, High Dose Seasonal PF 11/10/2014, 11/17/2015, 11/23/2016, 12/06/2017   Influenza-Unspecified 10/20/2012, 11/10/2014   Moderna Sars-Covid-2 Vaccination 11/12/2019, 12/10/2019   Pneumococcal Conjugate-13 05/12/2013, 08/05/2015   Tdap 10/19/2010    Conditions to be addressed/monitored:  Hypertension, Hyperlipidemia, Diabetes, Coronary Artery Disease, Chronic Kidney Disease, Osteoporosis and Osteoarthritis  Care Plan : General Pharmacy (Adult)  Updates made by Germaine Pomfret, RPH since 08/25/2021 12:00 AM     Problem: Hypertension, Hyperlipidemia, Diabetes, Coronary Artery Disease, Chronic Kidney Disease, Osteoporosis and Osteoarthritis   Priority: High     Long-Range Goal: Patient-Specific Goal   Start Date: 04/13/2020  Expected End Date: 08/25/2021  This Visit's Progress: On track  Recent Progress: On track  Priority: High  Note:   Current Barriers:  No barriers noted  Pharmacist Clinical Goal(s):  Over the next 90 days, patient will maintain control of diabetes as evidenced by A1c less than 7%  through collaboration with PharmD and provider.   Interventions: 1:1 collaboration with [name], MD regarding development and update of comprehensive plan of care as evidenced by provider attestation and co-signature Inter-disciplinary care team collaboration (see longitudinal plan of  care) Comprehensive medication review performed; medication list updated in electronic medical record  Hypertension (BP goal <140/90) -Not ideally controlled -Current treatment: Amlodipine 10 mg daily: Appropriate, Effective, Safe, Accessible  Olmesartan 20 mg daily: Appropriate, Effective, Safe, Accessible   -Medications previously tried: NA  -Current home readings: Checking <1x weekly   -Current dietary habits: Patient does not follow any specific dietary regimen -Current exercise habits: Patient does not follow any specific exercise regimen  -Denies hypotensive/hypertensive symptoms -Recommended to continue current medication  Hyperlipidemia: (LDL goal < 70) -Controlled -Current treatment: Lovaza 1g twice daily  Pravastatin 40 mg at bedtime  -Medications previously tried: NA  -Educated on Importance of limiting foods high in cholesterol; -Recommended to continue current medication  Diabetes (A1c goal <7%) -Controlled -Current medications: Metformin XR 750 mg 2 tablets daily: Appropriate, Effective, Safe, Accessible   -Medications previously tried: NA  -Current home glucose readings (checks 2-3 times weekly)  fasting glucose: 161, 140,  -Denies hypoglycemic/hyperglycemic symptoms -Educated on A1c and blood sugar goals; Carbohydrate counting and/or plate method -Counseled to check feet daily and get yearly eye exams -Recommended to continue current medication  Osteoporosis(Goal Maintain bone density and prevent fractures) -Controlled -Patient is a candidate for pharmacologic treatment due to T-Score < -2.5 in femoral neck -Current treatment  Prolia 60 mg every 6 months  -Medications previously tried: NA  -Recommend weight-bearing and muscle strengthening exercises for building and maintaining bone density. -Recommended to continue current medication  Patient Goals/Self-Care Activities Over the next 90 days, patient will:  - check glucose daily before breakfast,  document, and provide at future appointments check blood pressure weekly, document, and provide at future appointments  Follow Up Plan: No further follow up required: patient will reach out to clinical team as needed  Medication Assistance: None required.  Patient affirms current coverage meets needs.  Patient's [facility] pharmacy is:  [id number] [id number]: [phone]: [phone]  Uses pill box? No Pt endorses 75% compliance  We discussed: Current pharmacy is preferred with insurance plan and patient is satisfied with pharmacy services Patient decided to: Continue current medication management strategy  Care Plan and Follow Up Patient Decision:  Patient agrees to Care Plan and Follow-up.  Plan: No further follow up required: patient will reach out to clinical team as needed   AMalva Limes CMercerPharmacist Practitioner  [phone]

## 2021-08-24 ENCOUNTER — Encounter (INDEPENDENT_AMBULATORY_CARE_PROVIDER_SITE_OTHER): Payer: Medicare Other | Admitting: Ophthalmology

## 2021-08-25 ENCOUNTER — Encounter (INDEPENDENT_AMBULATORY_CARE_PROVIDER_SITE_OTHER): Payer: Medicare Other | Admitting: Ophthalmology

## 2021-08-25 DIAGNOSIS — I10 Essential (primary) hypertension: Secondary | ICD-10-CM

## 2021-08-25 DIAGNOSIS — H35033 Hypertensive retinopathy, bilateral: Secondary | ICD-10-CM

## 2021-08-25 DIAGNOSIS — H35342 Macular cyst, hole, or pseudohole, left eye: Secondary | ICD-10-CM | POA: Diagnosis not present

## 2021-08-25 DIAGNOSIS — H43813 Vitreous degeneration, bilateral: Secondary | ICD-10-CM

## 2021-08-25 DIAGNOSIS — H35372 Puckering of macula, left eye: Secondary | ICD-10-CM

## 2021-08-25 NOTE — Patient Instructions (Signed)
Visit Information It was great speaking with you today!  Please let me know if you have any questions about our visit.   Goals Addressed             This Visit's Progress    Monitor and Manage My Blood Sugar-Diabetes Type 2   On track    Timeframe:  Long-Range Goal Priority:  High Start Date: 04/13/2020                            Expected End Date: 08/25/2021                      Follow Up as needed   - check blood sugar once daily before breakfast   - check blood sugar if I feel it is too high or too low - enter blood sugar readings and medication or insulin into daily log    Why is this important?   Checking your blood sugar at home helps to keep it from getting very high or very low.  Writing the results in a diary or log helps the doctor know how to care for you.  Your blood sugar log should have the time, date and the results.  Also, write down the amount of insulin or other medicine that you take.  Other information, like what you ate, exercise done and how you were feeling, will also be helpful.     Notes:         Patient Care Plan: General Pharmacy (Adult)     Problem Identified: Hypertension, Hyperlipidemia, Diabetes, Coronary Artery Disease, Chronic Kidney Disease, Osteoporosis and Osteoarthritis   Priority: High     Long-Range Goal: Patient-Specific Goal   Start Date: 04/13/2020  Expected End Date: 08/25/2021  This Visit's Progress: On track  Recent Progress: On track  Priority: High  Note:   Current Barriers:  No barriers noted  Pharmacist Clinical Goal(s):  Over the next 90 days, patient will maintain control of diabetes as evidenced by A1c less than 7%  through collaboration with PharmD and provider.   Interventions: 1:1 collaboration with [name], MD regarding development and update of comprehensive plan of care as evidenced by provider attestation and co-signature Inter-disciplinary care team collaboration (see longitudinal plan of  care) Comprehensive medication review performed; medication list updated in electronic medical record  Hypertension (BP goal <140/90) -Not ideally controlled -Current treatment: Amlodipine 10 mg daily: Appropriate, Effective, Safe, Accessible  Olmesartan 20 mg daily: Appropriate, Effective, Safe, Accessible   -Medications previously tried: NA  -Current home readings: Checking <1x weekly   -Current dietary habits: Patient does not follow any specific dietary regimen -Current exercise habits: Patient does not follow any specific exercise regimen  -Denies hypotensive/hypertensive symptoms -Recommended to continue current medication  Hyperlipidemia: (LDL goal < 70) -Controlled -Current treatment: Lovaza 1g twice daily  Pravastatin 40 mg at bedtime  -Medications previously tried: NA  -Educated on Importance of limiting foods high in cholesterol; -Recommended to continue current medication  Diabetes (A1c goal <7%) -Controlled -Current medications: Metformin XR 750 mg 2 tablets daily: Appropriate, Effective, Safe, Accessible   -Medications previously tried: NA  -Current home glucose readings (checks 2-3 times weekly)  fasting glucose: 161, 140,  -Denies hypoglycemic/hyperglycemic symptoms -Educated on A1c and blood sugar goals; Carbohydrate counting and/or plate method -Counseled to check feet daily and get yearly eye exams -Recommended to continue current medication  Osteoporosis(Goal Maintain bone density and prevent  fractures) -Controlled -Patient is a candidate for pharmacologic treatment due to T-Score < -2.5 in femoral neck -Current treatment  Prolia 60 mg every 6 months  -Medications previously tried: NA  -Recommend weight-bearing and muscle strengthening exercises for building and maintaining bone density. -Recommended to continue current medication  Patient Goals/Self-Care Activities Over the next 90 days, patient will:  - check glucose daily before breakfast,  document, and provide at future appointments check blood pressure weekly, document, and provide at future appointments  Follow Up Plan: No further follow up required: patient will reach out to clinical team as needed    Patient agreed to services and verbal consent obtained.   The patient verbalized understanding of instructions, educational materials, and care plan provided today and DECLINED offer to receive copy of patient instructions, educational materials, and care plan.   Malva Limes, [place] Pharmacist Practitioner  [facility] [phone]

## 2021-08-28 NOTE — Progress Notes (Signed)
Virtual Visit via Video Note  I connected with Taylor [name] on 08/28/21 at 11:15 AM EDT by a video enabled telemedicine application and verified that I am speaking with the correct person using two identifiers.  Location: Patient: Home Provider: [facility] control center   I discussed the limitations of evaluation and management by telemedicine and the availability of in person appointments. The patient expressed understanding and agreed to proceed.  History of Present Illness: I spoke with Taylor [name] regarding her low back pain and lower extremity pain today via telephone as she was unable to do the video portion of the virtual conference.  She reports that she is doing reasonably well and continuing to take her medications as prescribed and these continue to work well for her.  No side effects reported.  She continues to get about 75% improvement lasting 4 to 6 hours before she gets return of her baseline intractable low back pain with bilateral hip and buttock pain.  She is trying to stay active as well and managing reasonably effectively.  She is doing some walking as tolerated she report and the quality characteristic and distribution of her pain is stable and without change.  No change in lower extremity strength or function is noted.  Review of systems: General: No fevers or chills Pulmonary: No shortness of breath or dyspnea Cardiac: No angina or palpitations or lightheadedness GI: No abdominal pain or constipation Psych: No depression    Observations/Objective:  Current Outpatient Medications:    [START ON 10/11/2021] oxyCODONE-acetaminophen (PERCOCET) 7.5-325 MG tablet, Take 1 tablet by mouth every 8 (eight) hours as needed for moderate pain or severe pain., Disp: 90 tablet, Rfl: 0   acetaminophen (TYLENOL) 500 MG tablet, Take 1 tablet (500 mg total) by mouth 3 (three) times daily. (Patient taking differently: Take 500 mg by mouth every 6 (six) hours as needed for moderate pain or headache.),  Disp: 90 tablet, Rfl: 0   acetaminophen (TYLENOL) 500 MG tablet, Take by mouth. (Patient not taking: Reported on 06/27/2021), Disp: , Rfl:    amLODipine (NORVASC) 5 MG tablet, Take 1 tablet (5 mg total) by mouth daily., Disp: 90 tablet, Rfl: 1   brimonidine-timolol (COMBIGAN) 0.2-0.5 % ophthalmic solution, Place 1 drop into both eyes 2 (two) times daily. , Disp: , Rfl:    Calcium Carbonate-Vitamin D (OYSTER SHELL CALCIUM 500 + D PO), Take 1 tablet by mouth 2 (two) times a day., Disp: , Rfl:    denosumab (PROLIA) 60 MG/ML SOSY injection, Inject into the skin., Disp: , Rfl:    Docusate Sodium 100 MG capsule, Take 100 mg by mouth 2 (two) times daily., Disp: , Rfl:    dorzolamide (TRUSOPT) 2 % ophthalmic solution, 1 drop 2 (two) times daily., Disp: , Rfl:    famotidine (PEPCID) 20 MG tablet, TAKE 1 TABLET BY MOUTH EVERY DAY, Disp: 90 tablet, Rfl: 1   ferrous sulfate 325 (65 FE) MG tablet, TAKE 1 TABLET BY MOUTH EVERY DAY WITH BREAKFAST, Disp: 90 tablet, Rfl: 0   fluticasone (FLONASE) 50 MCG/ACT nasal spray, SPRAY 2 SPRAYS INTO EACH NOSTRIL EVERY DAY, Disp: 48 mL, Rfl: 1   lactulose (CHRONULAC) 10 GM/15ML solution, Take 30 mLs (20 g total) by mouth daily as needed for mild constipation., Disp: 120 mL, Rfl: 0   Lancets (ONETOUCH DELICA PLUS [id number]) MISC, 1 each by Does not apply route daily., Disp: 100 each, Rfl: 2   lubiprostone (AMITIZA) 24 MCG capsule, TAKE 1 CAPSULE (24 MCG TOTAL) BY MOUTH  2 (TWO) TIMES DAILY WITH A MEAL., Disp: 180 capsule, Rfl: 1   metFORMIN (GLUCOPHAGE-XR) 750 MG 24 hr tablet, TAKE 2 TABLETS (1,500 MG TOTAL) BY MOUTH DAILY WITH BREAKFAST., Disp: 180 tablet, Rfl: 1   olmesartan (BENICAR) 20 MG tablet, TAKE 1 TABLET BY MOUTH EVERY DAY, Disp: 90 tablet, Rfl: 0   omega-3 acid ethyl esters (LOVAZA) 1 g capsule, TAKE 1 CAPSULE BY MOUTH TWICE A DAY, Disp: 180 capsule, Rfl: 0   ONETOUCH ULTRA test strip, USE AS DIRECTED, Disp: 100 strip, Rfl: 12   [START ON 09/11/2021]  oxyCODONE-acetaminophen (PERCOCET) 7.5-325 MG tablet, Take 1 tablet by mouth every 8 (eight) hours as needed for moderate pain or severe pain., Disp: 90 tablet, Rfl: 0   pravastatin (PRAVACHOL) 40 MG tablet, TAKE 1 TABLET BY MOUTH EVERYDAY AT BEDTIME, Disp: 90 tablet, Rfl: 0   ROCKLATAN 0.02-0.005 % SOLN, Apply 1 drop to eye at bedtime., Disp: , Rfl:    Vitamin D, Ergocalciferol, (DRISDOL) 1.25 MG (50000 UNIT) CAPS capsule, Take 50,000 Units by mouth every 7 (seven) days., Disp: , Rfl:    Past Medical History:  Diagnosis Date   Abnormal mammogram, unspecified 2013   Anemia    Arthritis    Breast screening, unspecified 2013   Diabetes mellitus without complication ([place]) 3762   non insulin dependent   Dysrhythmia    IRREGULAR HEART BEAT   GERD (gastroesophageal reflux disease)    Glaucoma 2003   Gout    History of kidney stones    H/O   Hyperlipidemia 2008   Hypertension 1980's   Lump or mass in breast 01/03/2012   left breast   Obesity, unspecified 2013   Osteoporosis    Shingles 2013   Special screening for malignant neoplasms, colon 2013     Assessment and Plan: 1. Chronic pain syndrome   2. Long term prescription opiate use   3. Osteoarthritis of multiple joints, unspecified osteoarthritis type   4. Chronic pain of right knee   5. Chronic midline low back pain without sciatica   6. Pain in both hands   7. Chronic pain of both knees   8. Acute left-sided thoracic back pain   Based on our discussion today I think it is appropriate to refill her medicines for the next 2 months dated for July 24 and August 23.  No other changes in her pharmacologic regimen will be initiated.  I talked her about continue with the certain exercises to help with low back muscle spasm stretching strengthening etc.  I do not think she is a candidate for any type of interventional therapy at this time.  Continue follow-up with her primary care physicians for baseline medical care with return to clinic  as mentioned  Follow Up Instructions:    I discussed the assessment and treatment plan with the patient. The patient was provided an opportunity to ask questions and all were answered. The patient agreed with the plan and demonstrated an understanding of the instructions.   The patient was advised to call back or seek an in-person evaluation if the symptoms worsen or if the condition fails to improve as anticipated.  I provided 30 minutes of non-face-to-face time during this encounter.   [name], MD

## 2021-09-01 ENCOUNTER — Other Ambulatory Visit: Payer: Self-pay | Admitting: Family Medicine

## 2021-09-01 DIAGNOSIS — K5909 Other constipation: Secondary | ICD-10-CM

## 2021-09-09 ENCOUNTER — Inpatient Hospital Stay
Admission: EM | Admit: 2021-09-09 | Discharge: 2021-09-12 | DRG: 305 | Disposition: A | Discharge status: Home Health | Payer: Medicare Other | Attending: Internal Medicine | Admitting: Internal Medicine

## 2021-09-09 DIAGNOSIS — Z6836 Body mass index (BMI) 36.0-36.9, adult: Secondary | ICD-10-CM

## 2021-09-09 DIAGNOSIS — M1990 Unspecified osteoarthritis, unspecified site: Secondary | ICD-10-CM | POA: Diagnosis present

## 2021-09-09 DIAGNOSIS — Z808 Family history of malignant neoplasm of other organs or systems: Secondary | ICD-10-CM

## 2021-09-09 DIAGNOSIS — G9389 Other specified disorders of brain: Secondary | ICD-10-CM

## 2021-09-09 DIAGNOSIS — H539 Unspecified visual disturbance: Secondary | ICD-10-CM

## 2021-09-09 DIAGNOSIS — E1169 Type 2 diabetes mellitus with other specified complication: Secondary | ICD-10-CM

## 2021-09-09 DIAGNOSIS — E785 Hyperlipidemia, unspecified: Secondary | ICD-10-CM | POA: Diagnosis present

## 2021-09-09 DIAGNOSIS — E1122 Type 2 diabetes mellitus with diabetic chronic kidney disease: Secondary | ICD-10-CM | POA: Diagnosis present

## 2021-09-09 DIAGNOSIS — D320 Benign neoplasm of cerebral meninges: Secondary | ICD-10-CM | POA: Diagnosis present

## 2021-09-09 DIAGNOSIS — I161 Hypertensive emergency: Secondary | ICD-10-CM | POA: Diagnosis not present

## 2021-09-09 DIAGNOSIS — Z8249 Family history of ischemic heart disease and other diseases of the circulatory system: Secondary | ICD-10-CM

## 2021-09-09 DIAGNOSIS — Z823 Family history of stroke: Secondary | ICD-10-CM

## 2021-09-09 DIAGNOSIS — G894 Chronic pain syndrome: Secondary | ICD-10-CM | POA: Diagnosis present

## 2021-09-09 DIAGNOSIS — E669 Obesity, unspecified: Secondary | ICD-10-CM

## 2021-09-09 DIAGNOSIS — R42 Dizziness and giddiness: Secondary | ICD-10-CM | POA: Diagnosis not present

## 2021-09-09 DIAGNOSIS — G8929 Other chronic pain: Secondary | ICD-10-CM | POA: Diagnosis present

## 2021-09-09 DIAGNOSIS — R29818 Other symptoms and signs involving the nervous system: Secondary | ICD-10-CM | POA: Diagnosis present

## 2021-09-09 DIAGNOSIS — Z20822 Contact with and (suspected) exposure to covid-19: Secondary | ICD-10-CM | POA: Diagnosis present

## 2021-09-09 DIAGNOSIS — K219 Gastro-esophageal reflux disease without esophagitis: Secondary | ICD-10-CM | POA: Diagnosis present

## 2021-09-09 DIAGNOSIS — D631 Anemia in chronic kidney disease: Secondary | ICD-10-CM | POA: Diagnosis present

## 2021-09-09 DIAGNOSIS — R29898 Other symptoms and signs involving the musculoskeletal system: Principal | ICD-10-CM

## 2021-09-09 DIAGNOSIS — Z7984 Long term (current) use of oral hypoglycemic drugs: Secondary | ICD-10-CM

## 2021-09-09 DIAGNOSIS — Z79899 Other long term (current) drug therapy: Secondary | ICD-10-CM

## 2021-09-09 DIAGNOSIS — H9190 Unspecified hearing loss, unspecified ear: Secondary | ICD-10-CM | POA: Diagnosis present

## 2021-09-09 DIAGNOSIS — D649 Anemia, unspecified: Secondary | ICD-10-CM | POA: Diagnosis present

## 2021-09-09 DIAGNOSIS — I129 Hypertensive chronic kidney disease with stage 1 through stage 4 chronic kidney disease, or unspecified chronic kidney disease: Secondary | ICD-10-CM | POA: Diagnosis present

## 2021-09-09 DIAGNOSIS — Z79891 Long term (current) use of opiate analgesic: Secondary | ICD-10-CM

## 2021-09-09 DIAGNOSIS — Z888 Allergy status to other drugs, medicaments and biological substances status: Secondary | ICD-10-CM

## 2021-09-09 DIAGNOSIS — M109 Gout, unspecified: Secondary | ICD-10-CM | POA: Diagnosis present

## 2021-09-09 DIAGNOSIS — H409 Unspecified glaucoma: Secondary | ICD-10-CM | POA: Diagnosis present

## 2021-09-09 DIAGNOSIS — N183 Chronic kidney disease, stage 3 unspecified: Secondary | ICD-10-CM | POA: Diagnosis present

## 2021-09-09 DIAGNOSIS — E119 Type 2 diabetes mellitus without complications: Secondary | ICD-10-CM

## 2021-09-09 DIAGNOSIS — M810 Age-related osteoporosis without current pathological fracture: Secondary | ICD-10-CM | POA: Diagnosis present

## 2021-09-09 DIAGNOSIS — N1831 Chronic kidney disease, stage 3a: Secondary | ICD-10-CM | POA: Diagnosis present

## 2021-09-09 NOTE — ED Provider Notes (Incomplete)
[facility] Provider Note    Event [name]/Time   First MD Initiated Contact with Patient 09/09/21 2328     (approximate)   History   No chief complaint on file.   HPI {Remember to add pertinent medical, surgical, social, and/or OB history to HPI:1} Taylor [name] is a 88 y.o. female who on review of note from Dr. Andree Elk on 630 has a history of chronic lower back and pelvic pain also irregular heartbeat, kidney stones hypertension hyperlipidemia diabetes and chronic pain syndrome    ----------------------------------------- 11:48 PM on 09/09/2021 ----------------------------------------- Attempted to call patient's listed son Taylor [name], no answer, HIPAA compliant voicemail left requesting callback   Physical Exam   Triage Vital Signs: ED Triage Vitals  Enc Vitals Group     BP      Pulse      Resp      Temp      Temp src      SpO2      Weight      Height      Head Circumference      Peak Flow      Pain Score      Pain Loc      Pain Edu?      Excl. in [place]?     Most recent vital signs: There were no vitals filed for this visit.  {Only need to document appropriate and relevant physical exam:1} General: Awake, no distress. *** CV:  Good peripheral perfusion. *** Resp:  Normal effort. *** Abd:  No distention. *** Other:  ***   ED Results / Procedures / Treatments   Labs (all labs ordered are listed, but only abnormal results are displayed) Labs Reviewed - No data to display   EKG  ***   RADIOLOGY *** {USE THE WORD "INTERPRETED"!! You MUST document your own interpretation of imaging, as well as the fact that you reviewed the radiologist's report!:1}   PROCEDURES:  Critical Care performed: {CriticalCareYesNo:19197::"Yes, see critical care procedure note(s)","No"}  Procedures   MEDICATIONS ORDERED IN ED: Medications - No data to display   IMPRESSION / MDM / [place] / ED COURSE  I reviewed the triage vital  signs and the nursing notes.                              Differential diagnosis includes, but is not limited to, ***  Patient's presentation is most consistent with {EM COPA:27473}  {If the patient is on the monitor, remove the brackets and asterisks on the sentence below and remember to document it as a Procedure as well. Otherwise delete the sentence below:1} {**The patient is on the cardiac monitor to evaluate for evidence of arrhythmia and/or significant heart rate changes.**} {Remember to include, when applicable, any/all of the following data: independent review of imaging independent review of labs (comment specifically on pertinent positives and negatives) review of specific prior hospitalizations, PCP/specialist notes, etc. discuss meds given and prescribed document any discussion with consultants (including hospitalists) any clinical decision tools you used and why (PECARN, NEXUS, etc.) did you consider admitting the patient? document social determinants of health affecting patient's care (homelessness, inability to follow up in a timely fashion, etc) document any pre-existing conditions increasing risk on current visit (e.g. diabetes and HTN increasing danger of high-risk chest pain/ACS) describes what meds you gave (especially parenteral) and why any other interventions?:1}     FINAL CLINICAL  IMPRESSION(S) / ED DIAGNOSES   Final diagnoses:  None     Rx / DC Orders   ED Discharge Orders     None        Note:  This document was prepared using Dragon voice recognition software and may include unintentional dictation errors.

## 2021-09-09 NOTE — ED Notes (Addendum)
Pts daughter and grandson called for update Darlene @ [phone] Renaldo @ [phone]

## 2021-09-09 NOTE — ED Provider Notes (Signed)
[facility] Provider Note    Event Date/Time   First MD Initiated Contact with Patient 09/09/21 2328     (approximate)   History   Dizziness and Hypertension (Per EMS patient called 911 stating that she didn't feel well and was dizzy. Patient was hypertensive with systolic BP in the 119'E. EMS states she ambulated with assistance to the ambulance, lives alone, and that they have done welfare checks on her in the past and her mental status is usually A&O x 2)   HPI  Taylor [name] is a 88 y.o. female who on review of note from Dr. Andree Elk on 630 has a history of chronic lower back and pelvic pain also irregular heartbeat, kidney stones hypertension hyperlipidemia diabetes and chronic pain syndrome    ----------------------------------------- 11:48 PM on 09/09/2021 ----------------------------------------- Attempted to call patient's listed son Kailee Essman, no answer, HIPAA compliant voicemail left requesting callback   Physical Exam   Triage Vital Signs: ED Triage Vitals  Enc Vitals Group     BP      Pulse      Resp      Temp      Temp src      SpO2      Weight      Height      Head Circumference      Peak Flow      Pain Score      Pain Loc      Pain Edu?      Excl. in [place]?     Most recent vital signs: Vitals:   09/10/21 0130 09/10/21 0205  BP: (!) 182/89 (!) 199/89  Pulse: (!) 108 (!) 104  Resp: 19 (!) 23  Temp:    SpO2: 100% 100%     General: Awake, no distress.  Somewhat poor historian, but identifies being in a hospital identifies where she lives correctly. CV:  Good peripheral perfusion.  Normal heart tone Resp:  Normal effort.  Clear bilateral Abd:  No distention.  Other:  No facial droop.  Questionable visual field abnormality, though unclear if some chronicity.  Mild right foot weakness.  My NIH exam indicates a score of 1 for weakness of the right lower leg against gravity   ED Results / Procedures / Treatments    Labs (all labs ordered are listed, but only abnormal results are displayed) Labs Reviewed  CBC - Abnormal; Notable for the following components:      Result Value   RBC 3.64 (*)    Hemoglobin 11.8 (*)    HCT 35.6 (*)    All other components within normal limits  COMPREHENSIVE METABOLIC PANEL - Abnormal; Notable for the following components:   Chloride 112 (*)    CO2 19 (*)    Glucose, Bld 139 (*)    Creatinine, Ser 1.46 (*)    AST 14 (*)    Alkaline Phosphatase 32 (*)    GFR, Estimated 35 (*)    All other components within normal limits  RESP PANEL BY RT-PCR (FLU A&B, COVID) ARPGX2  PROTIME-INR  APTT  DIFFERENTIAL  URINALYSIS, ROUTINE W REFLEX MICROSCOPIC     EKG  Interpreted by me at 0010 Heart rate 98 QRS 100 QTc 450 Normal sinus rhythm no evidence of acute ischemia   RADIOLOGY  Personally viewed and interpreted the patient's CT scan of the head is notable for mass lesion, see radiology report for further.  Discussed with Dr. Cari Caraway of neurosurgery  CT HEAD WO CONTRAST (5MM)  Result Date: 09/10/2021 CLINICAL DATA:  Acute neurologic deficit EXAM: CT HEAD WITHOUT CONTRAST TECHNIQUE: Contiguous axial images were obtained from the base of the skull through the vertex without intravenous contrast. RADIATION DOSE REDUCTION: This exam was performed according to the departmental dose-optimization program which includes automated exposure control, adjustment of the mA and/or kV according to patient size and/or use of iterative reconstruction technique. COMPARISON:  09/05/2020 FINDINGS: Brain: Unchanged anterior left convexity meningioma. Mass effect on the underlying brain with rightward bulging of the falx cerebri is unchanged. There is no acute hemorrhage. There is periventricular hypoattenuation compatible with chronic microvascular disease. Generalized atrophy. Vascular: Negative Skull: Normal Sinuses/Orbits: Clear Other: None IMPRESSION: 1. No acute intracranial  abnormality. 2. Unchanged anterior left convexity meningioma. Electronically Signed   By: Ulyses Jarred M.D.   On: 09/10/2021 00:55    No intracranial hemorrhage  PROCEDURES:  Critical Care performed: Yes, see critical care procedure note(s)  CRITICAL CARE Performed by: Delman Kitten   Total critical care time: 35 minutes  Critical care time was exclusive of separately billable procedures and treating other patients.  Critical care was necessary to treat or prevent imminent or life-threatening deterioration.  Critical care was time spent personally by me on the following activities: development of treatment plan with patient and/or surrogate as well as nursing, discussions with consultants, evaluation of patient's response to treatment, examination of patient, obtaining history from patient or surrogate, ordering and performing treatments and interventions, ordering and review of laboratory studies, ordering and review of radiographic studies, pulse oximetry and re-evaluation of patient's condition.   Procedures   MEDICATIONS ORDERED IN ED: Medications  aspirin tablet 325 mg (has no administration in time range)     IMPRESSION / MDM / ASSESSMENT AND PLAN / ED COURSE  I reviewed the triage vital signs and the nursing notes.                              Differential diagnosis includes, but is not limited to, possible stroke, near syncopal episode, vision abnormality or change, vertigo, etc.  High concern for acute central neurologic process based on history that the patient cannot define a clear timeline, and family reportedly saw her yesterday but is not clear what time.  She does not appear to be a candidate for tPA and also does not have a normal CT scan of the head with a mass lesion is likely a meningioma but Dr. Cari Caraway of neurosurgery agreeable to consult tomorrow.  No evidence of hemorrhage   Patient's presentation is most consistent with acute presentation with potential  threat to life or bodily function.  The patient is on the cardiac monitor to evaluate for evidence of arrhythmia and/or significant heart rate changes.    Clinical Course as of 09/10/21 6812  Nancy Fetter Sep 10, 2021  0002 Per son, Herbie [place], he is out of state and has not talked to mom for a few days. His son spoke with her yesterday. Robert's son saw her yesterday, but not able to speak with the son right now---- Herbie Baltimore trying to get me connected but son he can't reach him on phone phoen right now. Herbie Baltimore not sure if she has any weakness in R leg, but has some chronic back and knee issues.  [MQ]  [id number] I personally interpreted the patient's CT head for acute abnormality, and did note there is a large mass lesion noted [MQ]  Clinical Course User Index [MQ] Delman Kitten, MD   Labs interpreted as mild anemia with very slight increase in baseline creatinine, chronic kidney disease noted.  ----------------------------------------- 3:25 AM on 09/10/2021 ----------------------------------------- Case and consulted with hospitalist, Dr. Damita Dunnings will admit for further care and treatment.  Following recommendations of stroke physician as well for permissive hypertension.   FINAL CLINICAL IMPRESSION(S) / ED DIAGNOSES   Final diagnoses:  Weakness of right leg  Dizziness  Brain mass  Vision changes     Rx / DC Orders   ED Discharge Orders     None        Note:  This document was prepared using Dragon voice recognition software and may include unintentional dictation errors.   Delman Kitten, MD 09/10/21 [phone]

## 2021-09-10 ENCOUNTER — Observation Stay: Payer: Medicare Other

## 2021-09-10 ENCOUNTER — Other Ambulatory Visit: Payer: Self-pay

## 2021-09-10 ENCOUNTER — Encounter: Payer: Self-pay | Admitting: Radiology

## 2021-09-10 ENCOUNTER — Inpatient Hospital Stay: Payer: Medicare Other

## 2021-09-10 ENCOUNTER — Observation Stay (HOSPITAL_COMMUNITY)
Admit: 2021-09-10 | Discharge: 2021-09-10 | Disposition: A | Discharge status: Home / Self Care | Payer: Medicare Other | Attending: Internal Medicine | Admitting: Internal Medicine

## 2021-09-10 ENCOUNTER — Emergency Department: Payer: Medicare Other

## 2021-09-10 DIAGNOSIS — E669 Obesity, unspecified: Secondary | ICD-10-CM | POA: Diagnosis present

## 2021-09-10 DIAGNOSIS — Z79899 Other long term (current) drug therapy: Secondary | ICD-10-CM | POA: Diagnosis not present

## 2021-09-10 DIAGNOSIS — D420 Neoplasm of uncertain behavior of cerebral meninges: Secondary | ICD-10-CM | POA: Diagnosis not present

## 2021-09-10 DIAGNOSIS — M810 Age-related osteoporosis without current pathological fracture: Secondary | ICD-10-CM | POA: Diagnosis present

## 2021-09-10 DIAGNOSIS — Z20822 Contact with and (suspected) exposure to covid-19: Secondary | ICD-10-CM | POA: Diagnosis present

## 2021-09-10 DIAGNOSIS — H409 Unspecified glaucoma: Secondary | ICD-10-CM | POA: Diagnosis present

## 2021-09-10 DIAGNOSIS — I161 Hypertensive emergency: Secondary | ICD-10-CM | POA: Diagnosis present

## 2021-09-10 DIAGNOSIS — Z79891 Long term (current) use of opiate analgesic: Secondary | ICD-10-CM | POA: Diagnosis not present

## 2021-09-10 DIAGNOSIS — Z8249 Family history of ischemic heart disease and other diseases of the circulatory system: Secondary | ICD-10-CM | POA: Diagnosis not present

## 2021-09-10 DIAGNOSIS — M1990 Unspecified osteoarthritis, unspecified site: Secondary | ICD-10-CM | POA: Diagnosis present

## 2021-09-10 DIAGNOSIS — H9190 Unspecified hearing loss, unspecified ear: Secondary | ICD-10-CM | POA: Diagnosis present

## 2021-09-10 DIAGNOSIS — E1122 Type 2 diabetes mellitus with diabetic chronic kidney disease: Secondary | ICD-10-CM | POA: Diagnosis present

## 2021-09-10 DIAGNOSIS — Z888 Allergy status to other drugs, medicaments and biological substances status: Secondary | ICD-10-CM | POA: Diagnosis not present

## 2021-09-10 DIAGNOSIS — R29818 Other symptoms and signs involving the nervous system: Secondary | ICD-10-CM | POA: Diagnosis not present

## 2021-09-10 DIAGNOSIS — Z7984 Long term (current) use of oral hypoglycemic drugs: Secondary | ICD-10-CM | POA: Diagnosis not present

## 2021-09-10 DIAGNOSIS — M109 Gout, unspecified: Secondary | ICD-10-CM | POA: Diagnosis present

## 2021-09-10 DIAGNOSIS — R42 Dizziness and giddiness: Secondary | ICD-10-CM | POA: Insufficient documentation

## 2021-09-10 DIAGNOSIS — E785 Hyperlipidemia, unspecified: Secondary | ICD-10-CM | POA: Diagnosis present

## 2021-09-10 DIAGNOSIS — Z6836 Body mass index (BMI) 36.0-36.9, adult: Secondary | ICD-10-CM | POA: Diagnosis not present

## 2021-09-10 DIAGNOSIS — K219 Gastro-esophageal reflux disease without esophagitis: Secondary | ICD-10-CM | POA: Diagnosis present

## 2021-09-10 DIAGNOSIS — D320 Benign neoplasm of cerebral meninges: Secondary | ICD-10-CM | POA: Diagnosis present

## 2021-09-10 DIAGNOSIS — Z823 Family history of stroke: Secondary | ICD-10-CM | POA: Diagnosis not present

## 2021-09-10 DIAGNOSIS — N1831 Chronic kidney disease, stage 3a: Secondary | ICD-10-CM | POA: Diagnosis present

## 2021-09-10 DIAGNOSIS — Z808 Family history of malignant neoplasm of other organs or systems: Secondary | ICD-10-CM | POA: Diagnosis not present

## 2021-09-10 DIAGNOSIS — D631 Anemia in chronic kidney disease: Secondary | ICD-10-CM | POA: Diagnosis present

## 2021-09-10 DIAGNOSIS — I129 Hypertensive chronic kidney disease with stage 1 through stage 4 chronic kidney disease, or unspecified chronic kidney disease: Secondary | ICD-10-CM | POA: Diagnosis present

## 2021-09-10 DIAGNOSIS — G894 Chronic pain syndrome: Secondary | ICD-10-CM | POA: Diagnosis present

## 2021-09-10 LAB — DIFFERENTIAL
Abs Immature Granulocytes: 0.04 10*3/uL (ref 0.00–0.07)
Basophils Absolute: 0.1 10*3/uL (ref 0.0–0.1)
Basophils Relative: 1 %
Eosinophils Absolute: 0 10*3/uL (ref 0.0–0.5)
Eosinophils Relative: 0 %
Immature Granulocytes: 1 %
Lymphocytes Relative: 25 %
Lymphs Abs: 1.8 10*3/uL (ref 0.7–4.0)
Monocytes Absolute: 0.7 10*3/uL (ref 0.1–1.0)
Monocytes Relative: 10 %
Neutro Abs: 4.5 10*3/uL (ref 1.7–7.7)
Neutrophils Relative %: 63 %

## 2021-09-10 LAB — ECHOCARDIOGRAM COMPLETE
AR max vel: 1.65 cm2
AV Peak grad: 9.2 mmHg
Ao pk vel: 1.52 m/s
Area-P 1/2: 5.88 cm2
Calc EF: 55.4 %
S' Lateral: 2.83 cm
Single Plane A2C EF: 52.4 %
Single Plane A4C EF: 59.8 %
Weight: 3024 oz

## 2021-09-10 LAB — URINALYSIS, ROUTINE W REFLEX MICROSCOPIC
Bilirubin Urine: NEGATIVE
Glucose, UA: NEGATIVE mg/dL
Hgb urine dipstick: NEGATIVE
Ketones, ur: NEGATIVE mg/dL
Nitrite: NEGATIVE
Protein, ur: 30 mg/dL — AB
Specific Gravity, Urine: 1.011 (ref 1.005–1.030)
WBC, UA: >50 WBC/hpf — ABNORMAL HIGH (ref 0–5)
pH: 5 (ref 5.0–8.0)

## 2021-09-10 LAB — COMPREHENSIVE METABOLIC PANEL
ALT: 8 U/L (ref 0–44)
AST: 14 U/L — ABNORMAL LOW (ref 15–41)
Albumin: 3.9 g/dL (ref 3.5–5.0)
Alkaline Phosphatase: 32 U/L — ABNORMAL LOW (ref 38–126)
Anion gap: 6 (ref 5–15)
BUN: 21 mg/dL (ref 8–23)
CO2: 19 mmol/L — ABNORMAL LOW (ref 22–32)
Calcium: 9.2 mg/dL (ref 8.9–10.3)
Chloride: 112 mmol/L — ABNORMAL HIGH (ref 98–111)
Creatinine, Ser: 1.46 mg/dL — ABNORMAL HIGH (ref 0.44–1.00)
GFR, Estimated: 35 mL/min — ABNORMAL LOW (ref >60)
Glucose, Bld: 139 mg/dL — ABNORMAL HIGH (ref 70–99)
Potassium: 4 mmol/L (ref 3.5–5.1)
Sodium: 137 mmol/L (ref 135–145)
Total Bilirubin: 0.6 mg/dL (ref 0.3–1.2)
Total Protein: 7.1 g/dL (ref 6.5–8.1)

## 2021-09-10 LAB — CBC
HCT: 35.6 % — ABNORMAL LOW (ref 36.0–46.0)
HCT: 35.6 % — ABNORMAL LOW (ref 36.0–46.0)
Hemoglobin: 11.8 g/dL — ABNORMAL LOW (ref 12.0–15.0)
Hemoglobin: 11.6 g/dL — ABNORMAL LOW (ref 12.0–15.0)
MCH: 32.4 pg (ref 26.0–34.0)
MCH: 32 pg (ref 26.0–34.0)
MCHC: 33.1 g/dL (ref 30.0–36.0)
MCHC: 32.6 g/dL (ref 30.0–36.0)
MCV: 97.8 fL (ref 80.0–100.0)
MCV: 98.1 fL (ref 80.0–100.0)
Platelets: 345 10*3/uL (ref 150–400)
Platelets: 322 10*3/uL (ref 150–400)
RBC: 3.64 MIL/uL — ABNORMAL LOW (ref 3.87–5.11)
RBC: 3.63 MIL/uL — ABNORMAL LOW (ref 3.87–5.11)
RDW: 14.1 % (ref 11.5–15.5)
RDW: 13.9 % (ref 11.5–15.5)
WBC: 7.2 10*3/uL (ref 4.0–10.5)
WBC: 7.8 10*3/uL (ref 4.0–10.5)
nRBC: 0 % (ref 0.0–0.2)
nRBC: 0 % (ref 0.0–0.2)

## 2021-09-10 LAB — PROTIME-INR
INR: 1.1 (ref 0.8–1.2)
Prothrombin Time: 13.7 seconds (ref 11.4–15.2)

## 2021-09-10 LAB — CREATININE, SERUM
Creatinine, Ser: 1.3 mg/dL — ABNORMAL HIGH (ref 0.44–1.00)
GFR, Estimated: 40 mL/min — ABNORMAL LOW (ref >60)

## 2021-09-10 LAB — LIPID PANEL
Cholesterol: 116 mg/dL (ref 0–200)
HDL: 51 mg/dL (ref >40)
LDL Cholesterol: 54 mg/dL (ref 0–99)
Total CHOL/HDL Ratio: 2.3 RATIO
Triglycerides: 57 mg/dL (ref <150)
VLDL: 11 mg/dL (ref 0–40)

## 2021-09-10 LAB — RESP PANEL BY RT-PCR (FLU A&B, COVID) ARPGX2
Influenza A by PCR: NEGATIVE
Influenza B by PCR: NEGATIVE
SARS Coronavirus 2 by RT PCR: NEGATIVE

## 2021-09-10 LAB — APTT: aPTT: 25 seconds (ref 24–36)

## 2021-09-10 MED ORDER — ASPIRIN 325 MG PO TABS
325.0000 mg | ORAL_TABLET | Freq: Every day | ORAL | Status: DC
  Administered 2021-09-10: 325 mg via ORAL
  Filled 2021-09-10 (×2): qty 1

## 2021-09-10 MED ORDER — ASPIRIN 325 MG PO TABS
325.0000 mg | ORAL_TABLET | Freq: Every day | ORAL | Status: DC
  Filled 2021-09-10: qty 1

## 2021-09-10 MED ORDER — PRAVASTATIN SODIUM 20 MG PO TABS
40.0000 mg | ORAL_TABLET | Freq: Every day | ORAL | Status: DC
  Administered 2021-09-10 – 2021-09-12 (×3): 40 mg via ORAL
  Filled 2021-09-10 (×3): qty 2

## 2021-09-10 MED ORDER — IRBESARTAN 150 MG PO TABS: 150.0000 mg | ORAL_TABLET | Freq: Every day | ORAL | Status: DC

## 2021-09-10 MED ORDER — ACETAMINOPHEN 325 MG PO TABS
650.0000 mg | ORAL_TABLET | ORAL | Status: DC | PRN
  Administered 2021-09-11: 650 mg via ORAL
  Filled 2021-09-10: qty 2

## 2021-09-10 MED ORDER — ENOXAPARIN SODIUM 40 MG/0.4ML IJ SOSY
40.0000 mg | PREFILLED_SYRINGE | INTRAMUSCULAR | Status: DC
Start: 2021-09-10 — End: 2021-09-12
  Administered 2021-09-10 – 2021-09-12 (×3): 40 mg via SUBCUTANEOUS
  Filled 2021-09-10 (×3): qty 0.4

## 2021-09-10 MED ORDER — NETARSUDIL-LATANOPROST 0.02-0.005 % OP SOLN: 1.0000 [drp] | Freq: Every day | OPHTHALMIC | Status: DC

## 2021-09-10 MED ORDER — SODIUM CHLORIDE 0.9 % IV SOLN
1.0000 g | INTRAVENOUS | Status: DC
  Administered 2021-09-10 – 2021-09-11 (×2): 1 g via INTRAVENOUS
  Filled 2021-09-10 (×2): qty 1

## 2021-09-10 MED ORDER — BRIMONIDINE TARTRATE 0.2 % OP SOLN
1.0000 [drp] | Freq: Two times a day (BID) | OPHTHALMIC | Status: DC
  Administered 2021-09-10 – 2021-09-12 (×4): 1 [drp] via OPHTHALMIC
  Filled 2021-09-10: qty 5

## 2021-09-10 MED ORDER — ACETAMINOPHEN 160 MG/5ML PO SOLN: 650.0000 mg | ORAL | Status: DC | PRN

## 2021-09-10 MED ORDER — AMLODIPINE BESYLATE 5 MG PO TABS: 5.0000 mg | ORAL_TABLET | Freq: Once | ORAL | Status: DC

## 2021-09-10 MED ORDER — BRINZOLAMIDE 1 % OP SUSP: 1.0000 [drp] | Freq: Two times a day (BID) | OPHTHALMIC | Status: DC

## 2021-09-10 MED ORDER — ACETAMINOPHEN 650 MG RE SUPP: 650.0000 mg | RECTAL | Status: DC | PRN

## 2021-09-10 MED ORDER — ASPIRIN 81 MG PO CHEW: 81.0000 mg | CHEWABLE_TABLET | Freq: Once | ORAL | Status: DC

## 2021-09-10 MED ORDER — STROKE: EARLY STAGES OF RECOVERY BOOK: Freq: Once | Status: AC

## 2021-09-10 MED ORDER — IRBESARTAN 150 MG PO TABS
150.0000 mg | ORAL_TABLET | Freq: Every day | ORAL | Status: DC
  Administered 2021-09-10 – 2021-09-12 (×3): 150 mg via ORAL
  Filled 2021-09-10 (×4): qty 1

## 2021-09-10 MED ORDER — CLOPIDOGREL BISULFATE 75 MG PO TABS
75.0000 mg | ORAL_TABLET | Freq: Every day | ORAL | Status: DC
  Administered 2021-09-11: 75 mg via ORAL
  Filled 2021-09-10: qty 1

## 2021-09-10 MED ORDER — AMLODIPINE BESYLATE 5 MG PO TABS
5.0000 mg | ORAL_TABLET | Freq: Every day | ORAL | Status: DC
  Administered 2021-09-10 – 2021-09-12 (×3): 5 mg via ORAL
  Filled 2021-09-10 (×3): qty 1

## 2021-09-10 MED ORDER — GADOBUTROL 1 MMOL/ML IV SOLN
7.5000 mL | Freq: Once | INTRAVENOUS | Status: AC | PRN
  Administered 2021-09-10: 7.5 mL via INTRAVENOUS

## 2021-09-10 NOTE — Evaluation (Signed)
Physical Therapy Evaluation Patient Details Name: Taylor [name] MRN: [id number] DOB: 03-May-1932 Today's Date: 09/10/2021  History of Present Illness  Pt is an 88 year old female with PMH including HTN, HLD, diabetes, and chronic back pain who presented to ED with dizziness and feeling unwell. Patient was hypertensive with systolic BP in the 737'T.  Head CT scan revealed known meningioma along the high left frontal convexity, but neurology recommended workup of this be done outpatient as planned.   Clinical Impression  Pt admitted with above diagnosis. Pt received upright in bed agreeable to PT eval. Pt pleasant and cooperative throughout session. Reports living alone relying on [facility] and RW for mobility. Reports having increasingly difficult time with ADL's with living alone reporting not being able to bath beyond a sink bath, difficulty dressing herself, and having a hard time with toileting endorsing need for help at home.  Pt relies on church members for transportation and groceries primarily eating microwave meals. TO date pt relying on minA and bed features to reach sitting EOB and modA +1 HHA to stand to RW at bedside. Initial significant AP sway requiring min to modA from PT and hip strategy from pt to correct. VC's for anterior weight shift with good carryover. Pt denying gait attempts due to unsteadiness but able to side step to L towards HOB. Mod A required to transfer back to supine for LE management and modA tos coot up to EOB with pt displaying ability to utilize LE's in bridge to assist. Pt seated upright on bed with all needs in reach. At this time pt would not be able to physically manage on her own at home due to LE weakness and imbalance with inability to ambulate with LRAD at this time. Pt currently with functional limitations due to the deficits listed below (see PT Problem List). Pt will benefit from skilled PT to increase their independence and safety with mobility to allow discharge to  the venue listed below.     Recommendations for follow up therapy are one component of a multi-disciplinary discharge planning process, led by the attending physician.  Recommendations may be updated based on patient status, additional functional criteria and insurance authorization.  Follow Up Recommendations Skilled nursing-short term rehab (<3 hours/day) Can patient physically be transported by private vehicle: No    Assistance Recommended at Discharge Intermittent Supervision/Assistance  Patient can return home with the following  A lot of help with walking and/or transfers;A lot of help with bathing/dressing/bathroom;Assist for transportation;Assistance with cooking/housework;A little help with bathing/dressing/bathroom;Help with stairs or ramp for entrance    Equipment Recommendations Other (comment) (tbd by next venue of care)  Recommendations for Other Services       Functional Status Assessment Patient has had a recent decline in their functional status and demonstrates the ability to make significant improvements in function in a reasonable and predictable amount of time.     Precautions / Restrictions Precautions Precautions: Fall Restrictions Weight Bearing Restrictions: No      Mobility  Bed Mobility Overal bed mobility: Needs Assistance (Simultaneous filing. User may not have seen previous data.) Bed Mobility: Supine to Sit (Simultaneous filing. User may not have seen previous data.)     Supine to sit: Min assist, HOB elevated (Simultaneous filing. User may not have seen previous data.)     General bed mobility comments: increased time (Simultaneous filing. User may not have seen previous data.) Patient Response: Cooperative (Simultaneous filing. User may not have seen previous data.)  Transfers  Overall transfer level: Needs assistance (Simultaneous filing. User may not have seen previous data.) Equipment used: None, 1 person hand held assist (Simultaneous  filing. User may not have seen previous data.) Transfers: Sit to/from Stand (Simultaneous filing. User may not have seen previous data.) Sit to Stand: Mod assist, From elevated surface (Simultaneous filing. User may not have seen previous data.)           General transfer comment: Reluctant to try with RW (Simultaneous filing. User may not have seen previous data.)    Ambulation/Gait Ambulation/Gait assistance: Min guard Gait Distance (Feet): 1 Feet Assistive device: Rolling walker (2 wheels) Gait Pattern/deviations: Step-to pattern       General Gait Details: Took minimal side steps towards HOB. Deferrring further gait due to unsteadiness and fatigue.  Stairs            Wheelchair Mobility    Modified Rankin (Stroke Patients Only)       Balance Overall balance assessment: Needs assistance Sitting-balance support: Single extremity supported Sitting balance-Leahy Scale: Fair   Postural control: Posterior lean Standing balance support: Bilateral upper extremity supported Standing balance-Leahy Scale: Poor Standing balance comment: Pt displaying a lot of posterior bias in standing requiring mod VC's shifting weight anteriorly onto forefoot.                             Pertinent Vitals/Pain Pain Assessment Pain Assessment: No/denies pain    Home Living Family/patient expects to be discharged to:: Private residence Living Arrangements: Alone Available Help at Discharge: Friend(s);Available PRN/intermittently Type of Home: House Home Access: Ramped entrance Entrance Stairs-Rails: None Entrance Stairs-Number of Steps: 1   Home Layout: One level Home Equipment: Conservation officer, nature (2 wheels);Cane - single point Additional Comments: Reports having difficulty with bathing and dressing    Prior Function Prior Level of Function : Independent/Modified Independent       Physical Assist : ADLs (physical)   ADLs (physical): Bathing;Dressing;IADLs Mobility  Comments: mod-I with SPC in home with gait ADLs Comments: "making do" with bathing and dressing expressing difficult time performing these ADL's. Relies on microwave meals.     Hand Dominance        Extremity/Trunk Assessment   Upper Extremity Assessment Upper Extremity Assessment: Generalized weakness    Lower Extremity Assessment Lower Extremity Assessment: Generalized weakness    Cervical / Trunk Assessment Cervical / Trunk Assessment: Kyphotic  Communication   Communication: No difficulties;HOH  Cognition Arousal/Alertness: Awake/alert Behavior During Therapy: WFL for tasks assessed/performed Overall Cognitive Status: No family/caregiver present to determine baseline cognitive functioning                                          General Comments      Exercises Other Exercises Other Exercises: Role of PT in acute care, d/c recs   Assessment/Plan    PT Assessment Patient needs continued PT services  PT Problem List Decreased strength;Decreased knowledge of use of DME;Decreased activity tolerance;Decreased balance;Decreased mobility       PT Treatment Interventions DME instruction;Balance training;Gait training;Neuromuscular re-education;Stair training;Functional mobility training;Therapeutic exercise;Therapeutic activities;Patient/family education    PT Goals (Current goals can be found in the Care Plan section)  Acute Rehab PT Goals Patient Stated Goal: improve her mobility PT Goal Formulation: With patient Time For Goal Achievement: 09/24/21 Potential to Achieve Goals: Fair  Frequency Min 2X/week     Co-evaluation               AM-PAC PT "6 Clicks" Mobility  Outcome Measure Help needed turning from your back to your side while in a flat bed without using bedrails?: A Little Help needed moving from lying on your back to sitting on the side of a flat bed without using bedrails?: A Lot Help needed moving to and from a bed to a  chair (including a wheelchair)?: A Lot Help needed standing up from a chair using your arms (e.g., wheelchair or bedside chair)?: A Lot Help needed to walk in hospital room?: Total Help needed climbing 3-5 steps with a railing? : Total 6 Click Score: 11    End of Session Equipment Utilized During Treatment: Gait belt Activity Tolerance: Patient limited by fatigue Patient left: in bed;with bed alarm set Nurse Communication: Mobility status PT Visit Diagnosis: Other abnormalities of gait and mobility (R26.89);Muscle weakness (generalized) (M62.81)    Time: 6808-8110 PT Time Calculation (min) (ACUTE ONLY): 18 min   Charges:   PT Evaluation $PT Eval Moderate Complexity: Hamilton M. Fairly IV, PT, DPT Physical Therapist- [facility]  09/10/2021, 3:35 PM

## 2021-09-10 NOTE — Assessment & Plan Note (Addendum)
Patient with history of CKD stage IIIa, creatinine currently at baseline. -Monitor renal function -Avoid nephrotoxins

## 2021-09-10 NOTE — Evaluation (Addendum)
Speech Language Pathology Evaluation Patient Details Name: Taylor [name] MRN: [id number] DOB: Dec 01, 1932 Today's Date: 09/10/2021 Time: 6144-3154 SLP Time Calculation (min) (ACUTE ONLY): 25 min  Problem List:  Patient Active Problem List   Diagnosis Date Noted   Hypertensive emergency 09/10/2021   Acute focal neurological deficit 09/10/2021   Closed compression fracture of body of L1 vertebra ([place]) 09/23/2020   Gastric polyp    Polyp of colon    Morbid obesity ([place]) 04/08/2018   Age-related osteoporosis without current pathological fracture 09/26/2017   Pain in both hands 09/23/2017   Hyperparathyroidism ([place]) 08/06/2017   Chronic kidney disease, stage III (moderate) ([place]) 08/06/2017   HLD (hyperlipidemia) 07/25/2017   Osteoporotic compression fracture of spine ([place]) 03/22/2017   Assistance needed with transportation 03/21/2017   Iron deficiency anemia 12/19/2016   Aortic atherosclerosis ([place]) 11/23/2016   Anterolisthesis 11/23/2016   Chronic pain syndrome 09/13/2016   Vitamin D deficiency 08/19/2016   Anemia, unspecified 08/19/2016   Primary open angle glaucoma (POAG) of both eyes, severe stage 08/17/2016   Hypertensive retinopathy 08/17/2016   Long term current use of opiate analgesic 07/19/2016   Dyslipidemia associated with type 2 diabetes mellitus ([place]) 04/16/2016   Chronic midline low back pain without sciatica 04/16/2016   Chronic pain of right knee 04/16/2016   Vaginal dryness 02/22/2015   Calculus of kidney 11/10/2014   Osteoarthritis, multiple sites 09/09/2014   Type 2 diabetes mellitus with microalbuminuria ([place]) 09/09/2014   Glaucoma 09/09/2014   Benign hypertension with CKD (chronic kidney disease) stage III ([employer]) 09/09/2014   Chronic pain 09/09/2014   Tinea corporis 00/86/7619   Umbilical hernia 50/93/2671   Mass of right breast    Diabetes mellitus without complication ([place]) 2458   Past Medical History:  Past Medical History:  Diagnosis Date    Abnormal mammogram, unspecified 2013   Anemia    Arthritis    Breast screening, unspecified 2013   Diabetes mellitus without complication ([place]) 0998   non insulin dependent   Dysrhythmia    IRREGULAR HEART BEAT   GERD (gastroesophageal reflux disease)    Glaucoma 2003   Gout    History of kidney stones    H/O   Hyperlipidemia 2008   Hypertension 1980's   Lump or mass in breast 01/03/2012   left breast   Obesity, unspecified 2013   Osteoporosis    Shingles 2013   Special screening for malignant neoplasms, colon 2013   Past Surgical History:  Past Surgical History:  Procedure Laterality Date   ABDOMINAL HYSTERECTOMY  1958   menorrhagia   BACK SURGERY     BREAST BIOPSY Left 01/25/2012   BREAST BIOPSY Right 04/08/2019   hear clip, [place] bx, path pending   BREAST EXCISIONAL BIOPSY     BREAST LUMPECTOMY WITH NEEDLE LOCALIZATION Right 05/12/2019   Procedure: BREAST LUMPECTOMY WITH NEEDLE LOCALIZATION;  Surgeon: Olean Ree, MD;  Location: [facility] ORS;  Service: General;  Laterality: Right;   CATARACT EXTRACTION, BILATERAL Bilateral    1st 06/07/15 and the 2nd 06/21/15   CHOLECYSTECTOMY     COLONOSCOPY  2003   [facility]   COLONOSCOPY WITH PROPOFOL N/A 07/02/2019   Procedure: COLONOSCOPY WITH PROPOFOL;  Surgeon: Virgel Manifold, MD;  Location: [facility] ENDOSCOPY;  Service: Endoscopy;  Laterality: N/A;   COLONOSCOPY WITH PROPOFOL N/A 07/16/2019   Procedure: COLONOSCOPY WITH PROPOFOL;  Surgeon: Virgel Manifold, MD;  Location: [facility] ENDOSCOPY;  Service: Endoscopy;  Laterality: N/A;   ESOPHAGOGASTRODUODENOSCOPY (EGD) WITH PROPOFOL N/A 07/02/2019  Procedure: ESOPHAGOGASTRODUODENOSCOPY (EGD) WITH PROPOFOL;  Surgeon: Virgel Manifold, MD;  Location: [facility] ENDOSCOPY;  Service: Endoscopy;  Laterality: N/A;   EYE SURGERY     KNEE SURGERY  2009,2011,2012   twice on right and once on left   [place] SURGERY  2004   TONSILLECTOMY     age of 75   HPI:   Per H&P "Taylor [name] is a 88 y.o. female with medical history significant for DM, HTN, chronic pain on chronic opiates who presented by EMS with sudden onset vision change of uncertain time of onset.  She has associated dizziness.  She denied one-sided weakness numbness or tingling with the caveat that history taking limited due to patient being a difficult historian  ED course and data review: BP in the ED with systolic 440H to 474 and tachycardic to 108 with otherwise normal vitals  Labs: Mostly unremarkable with creatinine at baseline and hemoglobin 11.8 otherwise unremarkable  EKG personally reviewed was nonacute  CT head with no acute intracranial abnormality." MRI Head, 09/10/21, "1. No acute finding.  2. Known nearly 4 cm meningioma along the high left frontal  convexity.  3. Aging brain."   Assessment / Plan / Recommendation Clinical Impression  Pt seen for cognitive-communication evaluation. Pt alert, pleasant, and cooperative. HOH and visually impaired.  Pt stated she lives alone and does not drive. She reports handing her finances and medication PTA. Pt state, "I might need more help at home" when asked she does does managing her ADLs, IADLs. Friend/emergency contact, [name], provided additional details of pt's PLOF. Per friend, pt has intermittent assistance from friends/church family (e.g. driving, bring her meals) and [employer] completes welfare checks on her daily. Son and daughter live out of town.  Pt assessed by informal means, pt demonstrated intact verbal expression. Auditory comprehension was [facility] with repetition and extra time due to pt's hearing status. Pt A&Ox4. Pt with intact verbal problem solving; however, reduced functional problem solving due to visual impairment (e.g. locating button to reach nurse on call bell). Formal assessment of functional recall was limited due to visual and hearing impairment; however, pt able to return demonstration of use of call bell after 5  minute delay suggesting relatively intact functional recall.   Anticipate need for frequent/constant supervision at d/c given reduced functional problem solving in setting of hearing impairment and visual impairment.  Pt and RN made aware of results, recommendations, and SLP POC. Pt verbalized understanding/agreement.     SLP Assessment  SLP Recommendation/Assessment: Patient does not need any further Speech [facility] [place] [facility] SLP Visit Diagnosis: Cognitive communication deficit (R41.841)    Recommendations for follow up therapy are one component of a multi-disciplinary discharge planning process, led by the attending physician.  Recommendations may be updated based on patient status, additional functional criteria and insurance authorization.    Follow Up Recommendations  Follow physician's recommendations for discharge plan and follow up therapies    Assistance Recommended at Discharge  Frequent or constant Supervision/Assistance  Functional Status Assessment Patient has not had a recent decline in their functional status (for cognitive-communication)        SLP Evaluation Cognition  Arousal/Alertness: Awake/alert Orientation Level: Oriented X4 Memory: Appears intact (able to return demonstration of use of call bell after 5 minute delay) Problem Solving: Impaired Problem Solving Impairment: Functional complex (verbal WFL; functional - difficulty locating correct button on call bell initially due to visual deficits)  Comprehension  Auditory Comprehension Overall Auditory Comprehension: Impaired at baseline (due to hearing status) Yes/No Questions: Within Functional Limits (extra time and repetition due to hearing status) Commands:  (extra time and repetition due to hearing status) Conversation:  (extra time and repetition due to hearing status) Interfering Components: Hearing    Expression Expression Primary Mode of Expression: Verbal Verbal Expression Overall  Verbal Expression: Appears within functional limits for tasks assessed Automatic Speech: Social Response (WFL) Level of Generative/Spontaneous Verbalization: Conversation (WFL) Repetition: No impairment Pragmatics: No impairment   Oral / Motor  Oral Motor/Sensory Function Overall Oral Motor/Sensory Function: Within functional limits Motor Speech Overall Motor Speech: Appears within functional limits for tasks assessed Respiration: Within functional limits Phonation: Normal Resonance: Within functional limits Articulation: Within functional limitis Intelligibility: Intelligible Motor Planning: Witnin functional limits Motor Speech Errors: Not applicable           Cherrie Gauze, M.S., [facility] [phone] ([place])   Quintella Baton 09/10/2021, 11:21 AM

## 2021-09-10 NOTE — Hospital Course (Addendum)
Taylor [name] is a 88 y.o. female with medical history significant for DM, HTN, chronic pain on chronic opiates who presented by EMS with sudden onset vision change of uncertain time of onset.  She has associated dizziness.  She denied one-sided weakness numbness or tingling with the caveat that history taking limited due to patient being a difficult historian ED course and data review: BP in the ED with systolic 481E to 563 and tachycardic to 108 with otherwise normal vitals Labs: Mostly unremarkable with creatinine at baseline and hemoglobin 11.8 otherwise unremarkable EKG personally reviewed was nonacute CT head with no acute intracranial abnormality Teleneurology was consulted from the ED for stroke evaluation and they recommended admission for stroke work-up.  MRI brain was without any acute abnormality, did note chronic left frontal meningioma that was essentially unchanged.  Dizziness resolved.  Concern of possible hypertensive emergency versus TIA.  Patient was missing doses of blood pressure medications which were restarted with improvement in blood pressure.  Physical therapy evaluation recommending rehab.  7/24: Patient remained stable.  Decrease in hemoglobin to 10.6, no obvious bleeding.  No bowel movement for the past couple of days.  Patient would like to go home with home health services which were ordered.  Family requested another PT treatment and would like to be discharged home tomorrow.  7/25: Remains stable.  Blood pressure mildly elevated, increasing the home dose of amlodipine from 5-10 mg daily.  Patient will continue the rest of her home medications and follow-up with her primary care provider closely for further management.  PT recommendations remained for rehab.  [facility], on they have some sort of MSP case and will take at least 5 business days to determine the eligibility.  Patient would like to go back to home with home health  services which were ordered.  Family will provide rest of the care as needed.

## 2021-09-10 NOTE — H&P (Signed)
History and Physical    Patient: Taylor [name] DQQ:[id number] DOB: Aug 28, 1932 DOA: 09/09/2021 DOS: the patient was seen and examined on 09/10/2021 PCP: [name], MD  Patient coming from: Home  Chief Complaint:  Chief Complaint  Patient presents with   Dizziness   Hypertension    Per EMS patient called 911 stating that she didn't feel well and was dizzy. Patient was hypertensive with systolic BP in the 194'R. EMS states she ambulated with assistance to the ambulance, lives alone, and that they have done welfare checks on her in the past and her mental status is usually A&O x 2    HPI: Taylor [name] is a 88 y.o. female with medical history significant for DM, HTN, chronic pain on chronic opiates who presented by EMS with sudden onset vision change of uncertain time of onset.  She has associated dizziness.  She denied one-sided weakness numbness or tingling with the caveat that history taking limited due to patient being a difficult historian ED course and data review: BP in the ED with systolic 740C to 144 and tachycardic to 108 with otherwise normal vitals Labs: Mostly unremarkable with creatinine at baseline and hemoglobin 11.8 otherwise unremarkable EKG personally reviewed was nonacute CT head with no acute intracranial abnormality  Teleneurology was consulted from the ED for stroke evaluation and they recommended admission for inpatient stroke work-up.  Hospitalist consulted for admission.     Past Medical History:  Diagnosis Date   Abnormal mammogram, unspecified 2013   Anemia    Arthritis    Breast screening, unspecified 2013   Diabetes mellitus without complication ([place]) 8185   non insulin dependent   Dysrhythmia    IRREGULAR HEART BEAT   GERD (gastroesophageal reflux disease)    Glaucoma 2003   Gout    History of kidney stones    H/O   Hyperlipidemia 2008   Hypertension 1980's   Lump or mass in breast 01/03/2012   left breast   Obesity, unspecified 2013    Osteoporosis    Shingles 2013   Special screening for malignant neoplasms, colon 2013   Past Surgical History:  Procedure Laterality Date   ABDOMINAL HYSTERECTOMY  1958   menorrhagia   BACK SURGERY     BREAST BIOPSY Left 01/25/2012   BREAST BIOPSY Right 04/08/2019   hear clip, [place] bx, path pending   BREAST EXCISIONAL BIOPSY     BREAST LUMPECTOMY WITH NEEDLE LOCALIZATION Right 05/12/2019   Procedure: BREAST LUMPECTOMY WITH NEEDLE LOCALIZATION;  Surgeon: Olean Ree, MD;  Location: [facility] OR[facility];  Service: General;  Laterality: Right;   CATARACT EXTRACTION, BILATERAL Bilateral    1st 06/07/15 and the 2nd 06/21/15   CHOLECYSTECTOMY     COLONOSCOPY  2003   [facility]   COLONOSCOPY WITH PROPOFOL N/A 07/02/2019   Procedure: COLONOSCOPY WITH PROPOFOL;  Surgeon: Virgel Manifold, MD;  Location: [facility];  Service: Endoscopy;  Laterality: N/A;   COLONOSCOPY WITH PROPOFOL N/A 07/16/2019   Procedure: COLONOSCOPY WITH PROPOFOL;  Surgeon: Virgel Manifold, MD;  Location: [facility] END[facility];  Service: Endoscopy;  Laterality: N/A;   ESOPHAGOGASTRODUODENOSCOPY (EGD) WITH PROPOFOL N/A 07/02/2019   Procedure: ESOPHAGOGASTRODUODENOSCOPY (EGD) WITH PROPOFOL;  Surgeon: Virgel Manifold, MD;  Location: [facility] END[facility];  Service: Endoscopy;  Laterality: N/A;   EYE SURGERY     KNEE SURGERY  2009,2011,2012   twice on right and once on left   [place] SURGERY  2004  TONSILLECTOMY     age of 30   Social History:  reports that she has never smoked. She has never used smokeless tobacco. She reports that she does not drink alcohol and does not use drugs.  Allergies  Allergen Reactions   Ace Inhibitors Cough    Family History  Problem Relation Age of Onset   Cancer Mother        skin cancer   Stroke Mother    Heart disease Father    Breast cancer Neg Hx     Prior to Admission medications   Medication Sig Start Date End Date Taking? Authorizing Provider   acetaminophen (TYLENOL) 500 MG tablet Take 1 tablet (500 mg total) by mouth 3 (three) times daily. Patient taking differently: Take 500 mg by mouth every 6 (six) hours as needed for moderate pain or headache. 12/12/15   [name], MD  acetaminophen (TYLENOL) 500 MG tablet Take by mouth. Patient not taking: Reported on 06/27/2021    [provider]  amLODipine (NORVASC) 5 MG tablet Take 1 tablet (5 mg total) by mouth daily. 01/06/21   [name], MD  brimonidine (ALPHAGAN) 0.2 % ophthalmic solution Place 1 drop into both eyes 2 (two) times daily. 08/28/21   [provider]  brimonidine-timolol (COMBIGAN) 0.2-0.5 % ophthalmic solution Place 1 drop into both eyes 2 (two) times daily.     [provider]  brinzolamide (AZOPT) 1 % ophthalmic suspension Place 1 drop into both eyes 2 (two) times daily. 08/04/21   [provider]  Calcium Carbonate-Vitamin D (OYSTER SHELL CALCIUM 500 + D PO) Take 1 tablet by mouth 2 (two) times a day.    [provider]  denosumab (PROLIA) 60 MG/ML SOSY injection Inject into the skin. 05/25/19   [provider]  Docusate Sodium 100 MG capsule Take 100 mg by mouth 2 (two) times daily.    [provider]  dorzolamide (TRUSOPT) 2 % ophthalmic solution 1 drop 2 (two) times daily. 03/30/20   [provider]  famotidine (PEPCID) 20 MG tablet TAKE 1 TABLET BY MOUTH EVERY DAY 08/23/21   [name], MD  ferrous sulfate 325 (65 FE) MG tablet TAKE 1 TABLET BY MOUTH EVERY DAY WITH BREAKFAST 07/31/21   Ancil Boozer, Drue Stager, MD  fluticasone (FLONASE) 50 MCG/ACT nasal spray SPRAY 2 SPRAYS INTO EACH NOSTRIL EVERY DAY 12/08/19   Ancil Boozer, Drue Stager, MD  lactulose (CHRONULAC) 10 GM/15ML solution Take 30 mLs (20 g total) by mouth daily as needed for mild constipation. 03/24/21   [name], MD  Lancets [facility] DELICA PLUS [id number]) MISC 1 each by Does not apply route daily. 12/14/19   [name], MD  lubiprostone  (AMITIZA) 24 MCG capsule TAKE 1 CAPSULE (24 MCG TOTAL) BY MOUTH 2 (TWO) TIMES DAILY WITH A MEAL. 09/01/21   [name], MD  metFORMIN (GLUCOPHAGE-XR) 750 MG 24 hr tablet TAKE 2 TABLETS (1,500 MG TOTAL) BY MOUTH DAILY WITH BREAKFAST. 04/05/21   [name], MD  olmesartan (BENICAR) 20 MG tablet TAKE 1 TABLET BY MOUTH EVERY DAY 05/15/21   Ancil Boozer, Drue Stager, MD  omega-3 acid ethyl esters (LOVAZA) 1 g capsule TAKE 1 CAPSULE BY MOUTH TWICE A DAY 06/26/21   [name], MD  [facility] ULTRA test strip USE AS DIRECTED 05/03/20   [name], MD  oxyCODONE-acetaminophen (PERCOCET) 7.5-325 MG tablet Take 1 tablet by mouth every 8 (eight) hours as needed for moderate pain or severe pain. 09/11/21 10/11/21  [name], MD  oxyCODONE-acetaminophen (PERCOCET)  7.5-325 MG tablet Take 1 tablet by mouth every 8 (eight) hours as needed for moderate pain or severe pain. 10/11/21 11/10/21  [name], MD  pravastatin (PRAVACHOL) 40 MG tablet TAKE 1 TABLET BY MOUTH EVERYDAY AT BEDTIME 08/23/21   Sowles, Drue Stager, MD  ROCKLATAN 0.02-0.005 % SOLN Apply 1 drop to eye at bedtime. 08/18/19   [provider]  Vitamin D, Ergocalciferol, (DRISDOL) 1.25 MG (50000 UNIT) CAPS capsule Take 50,000 Units by mouth every 7 (seven) days.    [provider]    Physical Exam: Vitals:   09/10/21 0100 09/10/21 0115 09/10/21 0130 09/10/21 0205  BP: (!) 196/94 (!) 186/87 (!) 182/89 (!) 199/89  Pulse: (!) 106 (!) 108 (!) 108 (!) 104  Resp: 13 (!) 25 19 (!) 23  Temp:      TempSrc:      SpO2: 100% 100% 100% 100%  Weight:       Physical Exam Vitals and nursing note reviewed.  Constitutional:      General: She is not in acute distress. HENT:     Head: Normocephalic and atraumatic.  Cardiovascular:     Rate and Rhythm: Normal rate and regular rhythm.     Heart sounds: Normal heart sounds.  Pulmonary:     Effort: Pulmonary effort is normal.     Breath sounds: Normal breath sounds.  Abdominal:      Palpations: Abdomen is soft.     Tenderness: There is no abdominal tenderness.  Neurological:     Mental Status: Mental status is at baseline.     Labs on Admission: I have personally reviewed following labs and imaging studies  CBC: Recent Labs  Lab 09/10/21 0117  WBC 7.2  NEUTROABS 4.5  HGB 11.8*  HCT 35.6*  MCV 97.8  PLT 932   Basic Metabolic Panel: Recent Labs  Lab 09/10/21 0117  NA 137  K 4.0  CL 112*  CO2 19*  GLUCOSE 139*  BUN 21  CREATININE 1.46*  CALCIUM 9.2   GFR: Estimated Creatinine Clearance: 26.9 mL/min (A) (by C-G formula based on SCr of 1.46 mg/dL (H)). Liver Function Tests: Recent Labs  Lab 09/10/21 0117  AST 14*  ALT 8  ALKPHOS 32*  BILITOT 0.6  PROT 7.1  ALBUMIN 3.9   No results for input(s): "LIPASE", "AMYLASE" in the last 168 hours. No results for input(s): "AMMONIA" in the last 168 hours. Coagulation Profile: Recent Labs  Lab 09/10/21 0117  INR 1.1   Cardiac Enzymes: No results for input(s): "CKTOTAL", "CKMB", "CKMBINDEX", "TROPONINI" in the last 168 hours. BNP (last 3 results) No results for input(s): "PROBNP" in the last 8760 hours. HbA1C: No results for input(s): "HGBA1C" in the last 72 hours. CBG: No results for input(s): "GLUCAP" in the last 168 hours. Lipid Profile: No results for input(s): "CHOL", "HDL", "LDLCALC", "TRIG", "CHOLHDL", "LDLDIRECT" in the last 72 hours. Thyroid Function Tests: No results for input(s): "TSH", "T4TOTAL", "FREET4", "T3FREE", "THYROIDAB" in the last 72 hours. Anemia Panel: No results for input(s): "VITAMINB12", "FOLATE", "FERRITIN", "TIBC", "IRON", "RETICCTPCT" in the last 72 hours. Urine analysis:    Component Value Date/Time   COLORURINE YELLOW (A) 09/10/2021 0117   APPEARANCEUR HAZY (A) 09/10/2021 0117   LABSPEC 1.011 09/10/2021 0117   PHURINE 5.0 09/10/2021 0117   GLUCOSEU NEGATIVE 09/10/2021 0117   HGBUR NEGATIVE 09/10/2021 0117   BILIRUBINUR NEGATIVE 09/10/2021 0117   KETONESUR  NEGATIVE 09/10/2021 0117   PROTEINUR 30 (A) 09/10/2021 0117   NITRITE NEGATIVE 09/10/2021  0117   LEUKOCYTESUR LARGE (A) 09/10/2021 0117    Radiological Exams on Admission: CT HEAD WO CONTRAST (5MM)  Result Date: 09/10/2021 CLINICAL DATA:  Acute neurologic deficit EXAM: CT HEAD WITHOUT CONTRAST TECHNIQUE: Contiguous axial images were obtained from the base of the skull through the vertex without intravenous contrast. RADIATION DOSE REDUCTION: This exam was performed according to the departmental dose-optimization program which includes automated exposure control, adjustment of the mA and/or kV according to patient size and/or use of iterative reconstruction technique. COMPARISON:  09/05/2020 FINDINGS: Brain: Unchanged anterior left convexity meningioma. Mass effect on the underlying brain with rightward bulging of the falx cerebri is unchanged. There is no acute hemorrhage. There is periventricular hypoattenuation compatible with chronic microvascular disease. Generalized atrophy. Vascular: Negative Skull: Normal Sinuses/Orbits: Clear Other: None IMPRESSION: 1. No acute intracranial abnormality. 2. Unchanged anterior left convexity meningioma. Electronically Signed   By: Ulyses Jarred M.D.   On: 09/10/2021 00:55     Data Reviewed: Relevant notes from primary care and specialist visits, past discharge summaries as available in EHR, including Care Everywhere. Prior diagnostic testing as pertinent to current admission diagnoses Updated medications and problem lists for reconciliation ED course, including vitals, labs, imaging, treatment and response to treatment Triage notes, nursing and pharmacy notes and ED provider's notes Notable results as noted in HPI   Assessment and Plan: * Acute focal neurological deficit Recommendations per teleneurology:         Stroke/Telemetry Floor       Neuro Checks       Bedside Swallow Eval       DVT Prophylaxis       IV Fluids, Normal Saline       Head of  Bed 30 Degrees       Euglycemia and Avoid Hyperthermia (PRN Acetaminophen)       Initiate or continue Aspirin 325 MG daily       Antihypertensives PRN if Blood pressure is greater than 220/120 or there is a concern for End organ damage/contraindications for permissive HTN. If blood pressure is greater than 220/120 give labetalol PO or IV or Vasotec IV with a goal of 15% reduction in BP during the first 24 hours.   Per facility request will defer further work up, management, and referrals to inpatient service, inclusive of inpatient neurology consult.  An MRI has been ordered  Diabetes mellitus without complication (HCC) Sliding scale insulin coverage  Hypertensive emergency BP 199/89 but holding antihypertensives to allow for permissive hypertension  Chronic kidney disease, stage III (moderate) (HCC) CKD stage 3A- 3B at baseline  Chronic pain Chronic opiates Continue home oxycodone as needed        DVT prophylaxis: Lovenox  Consults: Neurologist, Dr. Quinn Axe  Advance Care Planning:   Code Status: Not on file   Family Communication: none  Disposition Plan: Back to previous home environment  Severity of Illness: The appropriate patient status for this patient is OBSERVATION. Observation status is judged to be reasonable and necessary in order to provide the required intensity of service to ensure the patient's safety. The patient's presenting symptoms, physical exam findings, and initial radiographic and laboratory data in the context of their medical condition is felt to place them at decreased risk for further clinical deterioration. Furthermore, it is anticipated that the patient will be medically stable for discharge from the hospital within 2 midnights of admission.   Author: Athena Masse, MD 09/10/2021 4:15 AM  For on call review [email or web address].

## 2021-09-10 NOTE — Assessment & Plan Note (Addendum)
Resolved.  Reports has missed few doses of blood pressure medications.  His symptoms of blurry vision and dizziness are most likely secondary to markedly elevated blood pressure on admission. Blood pressure currently within goal. -Continue amlodipine and irbesartan

## 2021-09-10 NOTE — Consult Note (Signed)
[facility] Consult Services   Patient Name:   Taylor, [name] Date of Birth:   [birth year] Identification Number:   MRN - [id number] Date of Service:   09/10/2021 00:35:38  Diagnosis:       H53.139 - Sudden visual loss, unspecified eye.       R20.2 - Paresthesia of skin  Impression:      88 -year-old female who apparently lives alone, uses a walker for ambulation, apparently has some chronic vision impairment related to retinopathy and glaucoma who presents the emergency room this evening by EMS due to worsening vision. She is somewhat uncertain when her symptoms started. She does have chronic vision issues but felt her vision worsened, possibly yesterday around dinnertime but patient not sure. There was mention of right-sided weakness but patient denied being aware of any other complaints except for acute vision changes. She does chronically have difficulty lifting her right leg as well as a left during ambulation.  .On examination, patient seems to have poor central vision, unknown if this is chronic but also seems to have diminished ability to sense motion in her right homonymous visual fields. In addition she does report some diminished sensation of right side but unclear from her recollection whether this is chronic or not. NIHSS = 3 CT head revealed no acute findings, she has a stable appearing left convexity frontal meningioma without significant mass effect and chronic ischemic changes.  Suggest admission for evaluation to evaluate for possibility of CVA versus other cause for her admitting complaints.  Our recommendations are outlined below.  Recommendations:        Stroke/Telemetry Floor       Neuro Checks       Bedside Swallow Eval       DVT Prophylaxis       IV Fluids, Normal Saline       Head of Bed 30 Degrees       Euglycemia and Avoid Hyperthermia (PRN Acetaminophen)       Initiate or continue Aspirin 325 MG daily       Antihypertensives PRN  if Blood pressure is greater than 220/120 or there is a concern for End organ damage/contraindications for permissive HTN. If blood pressure is greater than 220/120 give labetalol PO or IV or Vasotec IV with a goal of 15% reduction in BP during the first 24 hours.  Per facility request will defer further work up, management, and referrals to inpatient service, inclusive of inpatient neurology consult.  Sign Out:       Discussed with Emergency Department Provider    ------------------------------------------------------------------------------  Metrics: Last Known Well: Unknown [facility] Notification Time: 09/10/2021 00:35:38 Arrival Time: 09/09/2021 23:25:00 Stamp Time: 09/10/2021 00:35:38 Initial Response Time: 09/10/2021 00:38:41 Symptoms: Worsen vision, questionable right-sided weakness reported. Initial patient interaction: 09/10/2021 00:41:09 NIHSS Assessment Completed: 09/10/2021 00:48:23 Patient is not a candidate for Thrombolytic. Thrombolytic Medical Decision: 09/10/2021 00:48:23 Patient was not deemed candidate for Thrombolytic because of following reasons: Unknown last known normal.  CT head showed no acute hemorrhage or acute core infarct. I personally Reviewed the CT Head and it Showed Chronic left frontal meningioma, no definite acute findings. No hemorrhage.  Primary Provider Notified of Diagnostic Impression and Management Plan on: 09/10/2021 01:14:59    ------------------------------------------------------------------------------  History of Present Illness: Patient is a 88 year old Female.  Patient was brought by EMS for symptoms of Worsen vision, questionable right-sided weakness reported. 8 -year-old female who apparently lives alone, uses a walker for ambulation, apparently has  some chronic vision impairment related to retinopathy and glaucoma who presents the emergency room this evening by EMS due to worsening vision. She is somewhat uncertain when  her symptoms started. She does have chronic vision issues but felt her vision worsened, possibly yesterday around dinnertime but patient not sure. There was mention of right-sided weakness but patient denied being aware of any other complaints except for acute vision changes. She does chronically have difficulty lifting her right leg as well as a left.    Past Medical History:      Hypertension      Diabetes Mellitus      Hyperlipidemia [place] PMH:  History of renal calculi, history of shingles, history of osteoporosis,, irregular heart rhythm?, Anemia, DJD, history of compression fracture L1, chronic kidney disease, glaucoma, chronic gait disturbance, chronic hypertensive retinopathy, history of meningioma    CKD  Aortic atheror  Medications:  No Anticoagulant use  No Antiplatelet use Reviewed EMR for current medications Other Medications Pertinent To Assessment Include: Norvasc, Combigan, vitamin D/calcium, Prolia, Pepcid, Amitiza, Benicar, metformin, Flonase, pravastatin, oxycodone?  Allergies:  Reviewed Description: ACE inhibitors  Social History: Smoking: No Alcohol Use: No Drug Use: No  Family History:  There Is Family History KZ:LDJTTS, CAD, breast cancer, skin cancer There is no family history of premature cerebrovascular disease pertinent to this consultation  ROS : 14 Points Review of Systems was performed and was negative except mentioned in HPI.  Past Surgical History: There Is No Surgical History Contributory To Today's Visit There Is Surgical History of:  Hysterectomy, back surgery, breast biopsy, cholecystectomy, eye surgery, bilateral knee surgeries, lipoma excision, tonsillectomy     Examination: BP(177/82), Pulse(96), Blood Glucose(250) 1A: Level of Consciousness - Alert; keenly responsive + 0 1B: Ask Month and Age - Both Questions Right + 0 1C: Blink Eyes & Squeeze Hands - Performs Both Tasks + 0 2: Test Horizontal Extraocular Movements - Normal +  0 3: Test Visual Fields - Complete Hemianopia + 2 4: Test Facial Palsy (Use Grimace if Obtunded) - Normal symmetry + 0 5A: Test Left Arm Motor Drift - No Drift for 10 Seconds + 0 5B: Test Right Arm Motor Drift - No Drift for 10 Seconds + 0 6A: Test Left Leg Motor Drift - No Drift for 5 Seconds + 0 6B: Test Right Leg Motor Drift - No Drift for 5 Seconds + 0 7: Test Limb Ataxia (FNF/Heel-Shin) - No Ataxia + 0 8: Test Sensation - Mild-Moderate Loss: Less Sharp/More Dull + 1 9: Test Language/Aphasia - Normal; No aphasia + 0 10: Test Dysarthria - Normal + 0 11: Test Extinction/Inattention - No abnormality + 0  NIHSS Score: 3  NIHSS Free Text : Patient has poor central vision but also seems to have impaired ability to sense motion in her right visual field.  Diminished senation of right face , arm, leg - Unknown  Chronic limitation of ROM of right leg (doesn't lift as high)  Pre-Morbid Modified Rankin Scale: 3 Points = Moderate disability; requiring some help, but able to walk without assistance   Patient/Family was informed the Neurology Consult would occur via TeleHealth consult by way of interactive audio and video telecommunications and consented to receiving care in this manner.   Patient is being evaluated for possible acute neurologic impairment and high probability of imminent or life-threatening deterioration. I spent total of 41 minutes providing care to this patient, including time for face to face visit via telemedicine, review of medical records, imaging studies  and discussion of findings with providers, the patient and/or family.   Dr Daria Pastures   [facility] For Inpatient follow-up with [facility] physician please call [facility] [phone]. This is not an outpatient service. Post hospital discharge, please contact hospital directly.

## 2021-09-10 NOTE — Progress Notes (Addendum)
Progress Note   Patient: Taylor [name] ZOX:[id number] DOB: 07/14/1932 DOA: 09/09/2021     0 DOS: the patient was seen and examined on 09/10/2021   Brief hospital course: Taylor [name] is a 88 y.o. female with medical history significant for DM, HTN, chronic pain on chronic opiates who presented by EMS with sudden onset vision change of uncertain time of onset.  She has associated dizziness.  She denied one-sided weakness numbness or tingling with the caveat that history taking limited due to patient being a difficult historian ED course and data review: BP in the ED with systolic 811B to 147 and tachycardic to 108 with otherwise normal vitals Labs: Mostly unremarkable with creatinine at baseline and hemoglobin 11.8 otherwise unremarkable EKG personally reviewed was nonacute CT head with no acute intracranial abnormality Teleneurology was consulted from the ED for stroke evaluation and they recommended admission   Assessment and Plan:  Acute focal neurological deficit C/f CVA Pt presented with symptoms of sudden onset of blurred vision and dizziness last night (time unknown), teleneurology was consulted and recommended stroke work up. CT head and MRI without any acute findings, did note chronic left frontal meningioma that was essentially unchanged. 7/23 AM pt reports dizziness resolved. Low suspicion for CVA currently, suspect may be related to BP and possibly hypertensive emergency vs TIA.  -neurology consulted       Neuro Checks       Bedside Swallow Eval       DVT Prophylaxis       IV Fluids, Normal Saline       Head of Bed 30 Degrees       Euglycemia and Avoid Hyperthermia (PRN Acetaminophen)       Initiate Aspirin 325 MG daily  Hypertensive emergency BP 199/89 on admit, with symptoms of blurred vision and dizziness. Reports has maybe missed a few doses of BP pills.  -restart amlodipine 5 mg -restart irbesartan 150 mg     Diabetes mellitus without complication (HCC) Sliding  scale insulin coverage  Chronic kidney disease, stage III (moderate) (HCC) CKD stage 3A- 3B at baseline   Chronic pain Chronic opiates Continue home oxycodone as needed    DVT prophylaxis: Lovenox   Consults: Neurologist, Dr. Quinn Axe   Advance Care Planning:   Code Status: Full   Family Communication: none   Disposition Plan: Back to previous home environment     Subjective: Pt reports dizziness has resolved, and feels okay now. She said she may have missed some of her BP medications. Reports not drinking enough water at home, no GI losses. Lives alone.   Physical Exam: Vitals:   09/10/21 0530 09/10/21 0740 09/10/21 0946 09/10/21 1206  BP: (!) 199/90 (!) 174/113 (!) 192/78 (!) 170/84  Pulse: (!) 101 99 98 94  Resp: 19 (!) '21 19 17  '$ Temp: 98 F (36.7 C)  98.1 F (36.7 C) 98.1 F (36.7 C)  TempSrc: Oral  Oral Oral  SpO2: 94% 96% 100% 95%  Weight:       Physical Exam Vitals and nursing note reviewed.  Constitutional:      Appearance: Normal appearance. She is not ill-appearing.  HENT:     Head: Normocephalic and atraumatic.     Mouth/Throat:     Mouth: Mucous membranes are moist.  Cardiovascular:     Rate and Rhythm: Normal rate and regular rhythm.  Abdominal:     General: Abdomen is flat. There is no distension.     Palpations: Abdomen is  soft. There is no mass.     Tenderness: There is no abdominal tenderness.  Musculoskeletal:     Right lower leg: No edema.     Left lower leg: No edema.  Skin:    General: Skin is warm and dry.     Capillary Refill: Capillary refill takes less than 2 seconds.  Neurological:     Mental Status: She is alert.  Psychiatric:        Mood and Affect: Mood normal.        Behavior: Behavior normal.     Data Reviewed:     Latest Ref Rng & Units 09/10/2021    7:30 AM 09/10/2021    1:17 AM 11/29/2020   10:17 PM  CBC  WBC 4.0 - 10.5 K/uL 7.8  7.2  11.8   Hemoglobin 12.0 - 15.0 g/dL 11.6  11.8  13.0   Hematocrit 36.0 - 46.0 %  35.6  35.6  38.0   Platelets 150 - 400 K/uL 322  345  331        Latest Ref Rng & Units 09/10/2021    7:30 AM 09/10/2021    1:17 AM 03/24/2021    3:02 PM  BMP  Glucose 70 - 99 mg/dL  139  120   BUN 8 - 23 mg/dL  21  21   Creatinine 0.44 - 1.00 mg/dL 1.30  1.46  1.33   BUN/Creat Ratio 6 - 22 (calc)   16   Sodium 135 - 145 mmol/L  137  142   Potassium 3.5 - 5.1 mmol/L  4.0  4.5   Chloride 98 - 111 mmol/L  112  110   CO2 22 - 32 mmol/L  19  24   Calcium 8.9 - 10.3 mg/dL  9.2  9.7    MRI brain: 1. No acute finding. 2. Known nearly 4 cm meningioma along the high left frontal convexity. 3. Aging brain.  CT head:  1. No acute intracranial abnormality. 2. Unchanged anterior left convexity meningioma.    Family Communication: None at bedside  Disposition: Status is: Observation The patient remains OBS appropriate and will d/c before 2 midnights.  Planned Discharge Destination: Home with Home Health    Time spent: 35 minutes  Author: Lorelei Pont, MD 09/10/2021 12:30 PM  For on call review [email or web address].

## 2021-09-10 NOTE — Progress Notes (Signed)
*  PRELIMINARY RESULTS* Echocardiogram 2D Echocardiogram has been performed.  Taylor [name] 09/10/2021, 8:44 AM

## 2021-09-10 NOTE — Assessment & Plan Note (Signed)
Chronic opiates Continue home oxycodone as needed

## 2021-09-10 NOTE — Assessment & Plan Note (Addendum)
Patient initially admitted with concern of acute onset blurry vision and dizziness, initial concern of CVA but CT head and MRI was without any acute abnormality.  Patient appears to be at baseline now.  MRI did show a chronic left frontal meningioma that was essentially unchanged.  Dizziness resolved.. Symptoms concerning for secondary to hypertensive emergency. CVA work-up completed. PT/OT recommending SNF but patient would like to go back home with home health services.

## 2021-09-10 NOTE — Evaluation (Signed)
Occupational Therapy Evaluation Patient Details Name: Taylor Taylor [name] MRN: [id number] DOB: [birth year] Today's Date: 09/10/2021   History of Present Illness Pt is an 88 year old female with PMH including HTN, HLD, diabetes, and chronic back pain who presented to ED with dizziness and feeling unwell. Patient was hypertensive with systolic BP in the 858'I.  Head CT scan revealed known meningioma along the high left frontal convexity, but neurology recommended workup of this be done outpatient as planned.   Clinical Impression   Taylor Taylor [name] presents to OT with generalized weakness, low vision, and impaired cognition that impacts her safety and independence in ADLs.  Prior to admission, patient lived alone and received daily checks from friends or other community members.  Currently, patient requires min assist for seated UB ADLs due to low vision, and mod-max assist required for lower body dressing, bathing, and toileting.  Patient required min assist for bed mobility and max assist for sit to stand transfer using RW.  Pt demonstrated poor technique with RW, with limited ability to problem solve or follow commands to use in safe way.  She has limited central vision, which impacts her ability to read fine print or approach novel tasks.  Taylor Taylor [name] will likely continue to benefit from skilled OT services in acute setting to support functional strengthening, visual strategies, and cognition.  Recommend SNF upon discharge to further support these goals.      Recommendations for follow up therapy are one component of a multi-disciplinary discharge planning process, led by the attending physician.  Recommendations may be updated based on patient status, additional functional criteria and insurance authorization.   Follow Up Recommendations  Skilled nursing-short term rehab (<3 hours/day)    Assistance Recommended at Discharge Frequent or constant Supervision/Assistance  Patient can return home with the  following A lot of help with walking and/or transfers;A lot of help with bathing/dressing/bathroom;Assistance with cooking/housework;Direct supervision/assist for medications management;Direct supervision/assist for financial management;Assist for transportation;Help with stairs or ramp for entrance    Functional Status Assessment  Patient has had a recent decline in their functional status and demonstrates the ability to make significant improvements in function in a reasonable and predictable amount of time.  Equipment Recommendations   (defer to next level of care)    Recommendations for Other Services       Precautions / Restrictions Precautions Precautions: Fall Restrictions Weight Bearing Restrictions: No      Mobility Bed Mobility           Sit to supine: Min assist        Transfers                          Balance Overall balance assessment: Needs assistance Sitting-balance support: Single extremity supported Sitting balance-Leahy Scale: Fair     Standing balance support: Bilateral upper extremity supported Standing balance-Leahy Scale: Poor                             ADL either performed or assessed with clinical judgement   ADL Overall ADL's : Needs assistance/impaired Eating/Feeding: Sitting;Minimal assistance   Grooming: Sitting;Minimal assistance   Upper Body Bathing: Sitting;Minimal assistance   Lower Body Bathing: Sit to/from stand;Maximal assistance   Upper Body Dressing : Sitting;Minimal assistance   Lower Body Dressing: Maximal assistance;Sit to/from stand                 Taylor [name]  ADL Comments: Pt able to perform seated UB ADLs with setup/min assist due to impaired vision.  Anticipate max assist needed for lower body dressing/bathing/toileting due to impaired vision, level of assist needed to stand, and difficulty reaching B feet.     Vision Baseline Vision/History: 1 Wears glasses;5 Retinopathy;3  Glaucoma Ability to See in Adequate Light: 2 Moderately impaired Patient Visual Report: No change from baseline Vision Assessment?: Vision impaired- to be further tested in functional context     Perception     Praxis      Pertinent Vitals/Pain Pain Assessment Pain Assessment: No/denies pain     Hand Dominance     Extremity/Trunk Assessment Upper Extremity Assessment Upper Extremity Assessment: Generalized weakness   Lower Extremity Assessment Lower Extremity Assessment: Generalized weakness   Cervical / Trunk Assessment Cervical / Trunk Assessment: Kyphotic   Communication Communication Communication: No difficulties;HOH   Cognition Arousal/Alertness: Awake/alert Behavior During Therapy: WFL for tasks assessed/performed Overall Cognitive Status: No family/caregiver present to determine baseline cognitive functioning                                 Taylor [name] Comments: Grossly oriented, though short term memory deficits noted     Taylor [name] Comments       Exercises     Shoulder Instructions      Home Living Family/patient expects to be discharged to:: Private residence Living Arrangements: Alone Available Help at Discharge: Friend(s);Available PRN/intermittently Type of Home: House Home Access: Ramped entrance Entrance Stairs-Number of Steps: 1 Entrance Stairs-Rails: None Home Layout: One level     Bathroom Shower/Tub: Teacher, early years/pre: Handicapped height Bathroom Accessibility: Yes   Home Equipment: Conservation officer, nature (2 wheels);Cane - single point   Additional Comments: Reports having difficulty with bathing and dressing  Lives With: Alone    Prior Functioning/Environment Prior Level of Function : Independent/Modified Independent       Physical Assist : ADLs (physical)   ADLs (physical): Bathing;Dressing;IADLs Mobility Comments: mod-I with SPC in home with gait ADLs Comments: "making do" with bathing and dressing  expressing difficult time performing these ADL's. Relies on microwave meals.        OT Problem List: Decreased strength;Decreased range of motion;Decreased activity tolerance;Impaired balance (sitting and/or standing);Impaired vision/perception;Decreased cognition;Decreased safety awareness;Decreased knowledge of use of DME or AE;Pain      OT Treatment/Interventions: Self-care/ADL training;Therapeutic exercise;Energy conservation;DME and/or AE instruction;Therapeutic activities;Cognitive remediation/compensation;Visual/perceptual remediation/compensation;Patient/family education;Balance training    OT Goals(Current goals can be found in the care plan section) Acute Rehab OT Goals Patient Stated Goal: to return home OT Goal Formulation: With patient Time For Goal Achievement: 09/24/21 Potential to Achieve Goals: Fair  OT Frequency: Min 2X/week    Co-evaluation              AM-PAC OT "6 Clicks" Daily Activity     Outcome Measure Help from another person eating meals?: A Little Help from another person taking care of personal grooming?: A Little Help from another person toileting, which includes using toliet, bedpan, or urinal?: A Lot Help from another person bathing (including washing, rinsing, drying)?: A Lot Help from another person to put on and taking off regular upper body clothing?: A Little Help from another person to put on and taking off regular lower body clothing?: A Lot 6 Click Score: 15   End of Session Equipment Utilized During Treatment: Rolling walker (2 wheels);Gait belt  Activity Tolerance: Patient  tolerated treatment well Patient left: in bed;with call bell/phone within reach;with bed alarm set  OT Visit Diagnosis: Muscle weakness (generalized) (M62.81);Low vision, both eyes (H54.2)                Time: 1343-1420 OT Time Calculation (min): 37 min Charges:  OT Taylor [name] Charges $OT Visit: 1 Visit OT Evaluation $OT Eval Moderate Complexity: 1 Mod OT  Treatments $Therapeutic Activity: 8-22 mins  Jeneen Montgomery, OTR/L 09/10/21, 3:37 PM

## 2021-09-10 NOTE — Consult Note (Signed)
Referring Physician:  No referring provider defined for this encounter.  Primary Physician:  [name], MD  History of Present Illness: 09/10/2021 Taylor [name] is here today with a chief complaint of dizziness.  She called emergency services and was brought to the ER for evaluation.  She had systolic blood pressures over 200 at the time.  She lives alone and is able to manage her daily affairs at baseline.  During work-up, CT scan was performed and showed presence of an intracranial lesion.  I was consulted for recommendations.  She denies headaches currently.  She denies any other changes in her baseline status.  She is able to converse with me though she is very hard of hearing.  Review of Systems:  A 10 point review of systems is negative, except for the pertinent positives and negatives detailed in the HPI.  Past Medical History: Past Medical History:  Diagnosis Date   Abnormal mammogram, unspecified 2013   Anemia    Arthritis    Breast screening, unspecified 2013   Diabetes mellitus without complication ([place]) 1025   non insulin dependent   Dysrhythmia    IRREGULAR HEART BEAT   GERD (gastroesophageal reflux disease)    Glaucoma 2003   Gout    History of kidney stones    H/O   Hyperlipidemia 2008   Hypertension 1980's   Lump or mass in breast 01/03/2012   left breast   Obesity, unspecified 2013   Osteoporosis    Shingles 2013   Special screening for malignant neoplasms, colon 2013    Past Surgical History: Past Surgical History:  Procedure Laterality Date   ABDOMINAL HYSTERECTOMY  1958   menorrhagia   BACK SURGERY     BREAST BIOPSY Left 01/25/2012   BREAST BIOPSY Right 04/08/2019   hear clip, [place] bx, path pending   BREAST EXCISIONAL BIOPSY     BREAST LUMPECTOMY WITH NEEDLE LOCALIZATION Right 05/12/2019   Procedure: BREAST LUMPECTOMY WITH NEEDLE LOCALIZATION;  Surgeon: Olean Ree, MD;  Location: [facility] ORS;  Service: General;  Laterality: Right;    CATARACT EXTRACTION, BILATERAL Bilateral    1st 06/07/15 and the 2nd 06/21/15   CHOLECYSTECTOMY     COLONOSCOPY  2003   [facility]   COLONOSCOPY WITH PROPOFOL N/A 07/02/2019   Procedure: COLONOSCOPY WITH PROPOFOL;  Surgeon: Virgel Manifold, MD;  Location: [facility] ENDOSCOPY;  Service: Endoscopy;  Laterality: N/A;   COLONOSCOPY WITH PROPOFOL N/A 07/16/2019   Procedure: COLONOSCOPY WITH PROPOFOL;  Surgeon: Virgel Manifold, MD;  Location: [facility] ENDOSCOPY;  Service: Endoscopy;  Laterality: N/A;   ESOPHAGOGASTRODUODENOSCOPY (EGD) WITH PROPOFOL N/A 07/02/2019   Procedure: ESOPHAGOGASTRODUODENOSCOPY (EGD) WITH PROPOFOL;  Surgeon: Virgel Manifold, MD;  Location: [facility] ENDOSCOPY;  Service: Endoscopy;  Laterality: N/A;   EYE SURGERY     KNEE SURGERY  2009,2011,2012   twice on right and once on left   [place] SURGERY  2004   TONSILLECTOMY     age of 75    Allergies: Allergies as of 09/09/2021 - Review Complete 08/18/2021  Allergen Reaction Noted   Ace inhibitors Cough 04/03/2017    Medications: No outpatient medications have been marked as taking for the 09/09/21 encounter [facility] Encounter).    Social History: Social History   Tobacco Use   Smoking status: Never   Smokeless tobacco: Never  Vaping Use   Vaping Use: Never used  Substance Use Topics   Alcohol use:  No    Alcohol/week: 0.0 standard drinks of alcohol   Drug use: No    Family Medical History: Family History  Problem Relation Age of Onset   Cancer Mother        skin cancer   Stroke Mother    Heart disease Father    Breast cancer Neg Hx     Physical Examination: Vitals:   09/10/21 0740 09/10/21 0946  BP: (!) 174/113 (!) 192/78  Pulse: 99 98  Resp: (!) 21 19  Temp:  98.1 F (36.7 C)  SpO2: 96% 100%    General: Patient is well developed, well nourished, calm, collected, and in no apparent distress. Attention to examination is appropriate.  Neck:   Supple.  Full  range of motion.  Respiratory: Patient is breathing without any difficulty.   NEUROLOGICAL:     Awake, alert, oriented to person, place, and time.  Speech is clear and fluent. Fund of knowledge is appropriate.   Cranial Nerves: Pupils equal round and reactive to light.  Facial tone is symmetric.  Facial sensation is symmetric. Shoulder shrug is symmetric. Tongue protrusion is midline.  There is no pronator drift.  ROM of spine: full.    Strength: Side Biceps Triceps Deltoid Interossei Grip Wrist Ext. Wrist Flex.  R '5 5 5 5 5 5 5  '$ L '5 5 5 5 5 5 5   '$ Side Iliopsoas Quads Hamstring PF DF EHL  R '5 5 5 5 5 5  '$ L '5 5 5 5 5 5   '$ Reflexes are 1+ and symmetric at the biceps, triceps, brachioradialis, patella and achilles.   Hoffman's is absent.  Clonus is not present.  Toes are down-going.  Bilateral upper and lower extremity sensation is intact to light touch.    No evidence of dysmetria noted.  Gait is untested.     Medical Decision Making  Imaging: MR Brain 09/10/21 IMPRESSION: 1. No acute finding. 2. Known nearly 4 cm meningioma along the high left frontal convexity. 3. Aging brain.     Electronically Signed   By: Jorje Guild M.D.   On: 09/10/2021 06:27    I have personally reviewed the images and agree with the above interpretation.  Assessment and Plan: Taylor [name] is a pleasant 88 y.o. female with known left frontal meningioma that appears to be relatively stable in size.  I do not think this is related to her presentation.  She has a planned follow-up scheduled with me in August.  I would recommend she keep that follow-up and I will manage this condition as an outpatient.  I defer management of her other conditions to the primary service.  I spent a total of 30 minutes in face-to-face and non-face-to-face activities related to this patient's care today.  Thank you for involving me in the care of this patient.      Verdis Bassette K. Izora Ribas MD,  [facility] Neurosurgery

## 2021-09-10 NOTE — Progress Notes (Signed)
Code Stroke activated @ 0018  Pt to CT @ 0024, returned @ 1638.  mRS 3.  NIHSS 3.  Dr. Elvin So on camera @ [id number].  No TNK.

## 2021-09-10 NOTE — Assessment & Plan Note (Addendum)
Sliding scale insulin coverage 

## 2021-09-11 DIAGNOSIS — E669 Obesity, unspecified: Secondary | ICD-10-CM

## 2021-09-11 DIAGNOSIS — R29818 Other symptoms and signs involving the nervous system: Secondary | ICD-10-CM | POA: Diagnosis not present

## 2021-09-11 LAB — COMPREHENSIVE METABOLIC PANEL
ALT: 7 U/L (ref 0–44)
AST: 9 U/L — ABNORMAL LOW (ref 15–41)
Albumin: 3.3 g/dL — ABNORMAL LOW (ref 3.5–5.0)
Alkaline Phosphatase: 32 U/L — ABNORMAL LOW (ref 38–126)
Anion gap: 9 (ref 5–15)
BUN: 24 mg/dL — ABNORMAL HIGH (ref 8–23)
CO2: 22 mmol/L (ref 22–32)
Calcium: 9 mg/dL (ref 8.9–10.3)
Chloride: 110 mmol/L (ref 98–111)
Creatinine, Ser: 1.27 mg/dL — ABNORMAL HIGH (ref 0.44–1.00)
GFR, Estimated: 41 mL/min — ABNORMAL LOW (ref >60)
Glucose, Bld: 106 mg/dL — ABNORMAL HIGH (ref 70–99)
Potassium: 4.2 mmol/L (ref 3.5–5.1)
Sodium: 141 mmol/L (ref 135–145)
Total Bilirubin: 0.7 mg/dL (ref 0.3–1.2)
Total Protein: 6.2 g/dL — ABNORMAL LOW (ref 6.5–8.1)

## 2021-09-11 LAB — CBC
HCT: 32.4 % — ABNORMAL LOW (ref 36.0–46.0)
Hemoglobin: 10.6 g/dL — ABNORMAL LOW (ref 12.0–15.0)
MCH: 31.7 pg (ref 26.0–34.0)
MCHC: 32.7 g/dL (ref 30.0–36.0)
MCV: 97 fL (ref 80.0–100.0)
Platelets: 328 10*3/uL (ref 150–400)
RBC: 3.34 MIL/uL — ABNORMAL LOW (ref 3.87–5.11)
RDW: 14 % (ref 11.5–15.5)
WBC: 5.7 10*3/uL (ref 4.0–10.5)
nRBC: 0 % (ref 0.0–0.2)

## 2021-09-11 LAB — GLUCOSE, CAPILLARY
Glucose-Capillary: 196 mg/dL — ABNORMAL HIGH (ref 70–99)
Glucose-Capillary: 128 mg/dL — ABNORMAL HIGH (ref 70–99)

## 2021-09-11 MED ORDER — POLYETHYLENE GLYCOL 3350 17 G PO PACK
17.0000 g | PACK | Freq: Every day | ORAL | Status: DC | PRN
  Administered 2021-09-11: 17 g via ORAL
  Filled 2021-09-11: qty 1

## 2021-09-11 MED ORDER — FERROUS SULFATE 325 (65 FE) MG PO TABS
325.0000 mg | ORAL_TABLET | Freq: Two times a day (BID) | ORAL | Status: DC
  Administered 2021-09-11 – 2021-09-12 (×2): 325 mg via ORAL
  Filled 2021-09-11 (×2): qty 1

## 2021-09-11 MED ORDER — DOCUSATE SODIUM 100 MG PO CAPS
100.0000 mg | ORAL_CAPSULE | Freq: Two times a day (BID) | ORAL | Status: DC
  Administered 2021-09-11 – 2021-09-12 (×3): 100 mg via ORAL
  Filled 2021-09-11 (×3): qty 1

## 2021-09-11 MED ORDER — ALUM & MAG HYDROXIDE-SIMETH 200-200-20 MG/5ML PO SUSP
30.0000 mL | ORAL | Status: DC | PRN
  Administered 2021-09-11: 30 mL via ORAL
  Filled 2021-09-11: qty 30

## 2021-09-11 MED ORDER — INSULIN ASPART 100 UNIT/ML IJ SOLN
0.0000 [IU] | Freq: Three times a day (TID) | INTRAMUSCULAR | Status: DC
  Administered 2021-09-11 – 2021-09-12 (×2): 2 [IU] via SUBCUTANEOUS
  Administered 2021-09-12: 1 [IU] via SUBCUTANEOUS
  Filled 2021-09-11 (×3): qty 1

## 2021-09-11 MED ORDER — INSULIN ASPART 100 UNIT/ML IJ SOLN: 0.0000 [IU] | Freq: Every day | INTRAMUSCULAR | Status: DC

## 2021-09-11 MED ORDER — LUBIPROSTONE 24 MCG PO CAPS
24.0000 ug | ORAL_CAPSULE | Freq: Every day | ORAL | Status: DC
  Administered 2021-09-12: 24 ug via ORAL
  Filled 2021-09-11: qty 1

## 2021-09-11 MED ORDER — DORZOLAMIDE HCL 2 % OP SOLN
1.0000 [drp] | Freq: Two times a day (BID) | OPHTHALMIC | Status: DC
  Administered 2021-09-11 – 2021-09-12 (×3): 1 [drp] via OPHTHALMIC
  Filled 2021-09-11: qty 10

## 2021-09-11 MED ORDER — FLUTICASONE PROPIONATE 50 MCG/ACT NA SUSP
1.0000 | Freq: Every day | NASAL | Status: DC
  Administered 2021-09-11 – 2021-09-12 (×2): 1 via NASAL
  Filled 2021-09-11: qty 16

## 2021-09-11 NOTE — TOC Progression Note (Signed)
Transition of Care [facility]) - Progression Note    Patient Details  Name: Taylor [name] MRN: [id number] Date of Birth: 09-14-1932  Transition of Care [facility]) CM/SW Contact  Eileen Stanford, LCSW Phone Number: 09/11/2021, 1:54 PM  Clinical Narrative:   Pt's daughter at bedside. They have decided on dc home not SNF. Pt would like HH. Pt has a walker and bedside commode at home. CSW has provided referral to [place]- pending.         Expected Discharge Plan and Services                                                 Social Determinants of Health (SDOH) Interventions    Readmission Risk Interventions     No data to display

## 2021-09-11 NOTE — Progress Notes (Signed)
Physical Therapy Treatment Patient Details Name: Taylor [name] MRN: [id number] DOB: January 07, 1933 Today's Date: 09/11/2021   History of Present Illness Pt is an 88 year old female with PMH including HTN, HLD, diabetes, and chronic back pain who presented to ED with dizziness and feeling unwell. Patient was hypertensive with systolic BP in the 361'W.  Head CT scan revealed known meningioma along the high left frontal convexity, but neurology recommended workup of this be done outpatient as planned.    PT Comments    Pt in chair.  Daughter in room who leaves room soon after I arrived.  She is able to stand with min guard-> min assist due to imbalance upon standing with multiple attempts to get fully upright.  Once standing she is able to hesitantly march in place x 3 steps stating she has trouble WB R LE.  She is able to turn to bed with no evidence of pain in RLE and takes a short seated rest.  She is able to stand again and walk around bed to chair on the other side with slow gait with short bilateral step lengths.  Opts to take a seated rest.  After several minutes, she is able to stand and walk back to recliner.  She does have 2 short incidents where she  pauses her gait and seems to "zone out" for a few moments and does not respond and feels a bit unsteady but when asked stated "I'm all right".  When questioned she states "If I did I don't remember it."   Long discussion with pt about discharge.  She lives alone.  Stated her daughter can help but stated she would not be there all the time.  SNF remains recommended for discharge.  If pt and daughter choose to return home, 24 hour assist, HHPT/OT and aide services as available and a wheelchair would be appropriate.  She has all other equipment and is encouraged to use RW vs rollator.  Attempted to find daughter after session to discuss but I was unable to locate her.  Will discuss with team.  Patient suffers from dizziness, global weakness and balance  impairments which impairs his/her ability to perform daily activities like toileting, feeding, dressing, grooming, bathing in the home. A cane, walker, crutch will not resolve the patient's issue with performing activities of daily living. A lightweight wheelchair and cushion is required/recommended and will allow patient to safely perform daily activities.   Patient can safely propel the wheelchair in the home or has a caregiver who can provide assistance.    Recommendations for follow up therapy are one component of a multi-disciplinary discharge planning process, led by the attending physician.  Recommendations may be updated based on patient status, additional functional criteria and insurance authorization.  Follow Up Recommendations  Skilled nursing-short term rehab (<3 hours/day)     Assistance Recommended at Discharge Intermittent Supervision/Assistance  Patient can return home with the following A lot of help with walking and/or transfers;Assist for transportation;Assistance with cooking/housework;A little help with bathing/dressing/bathroom;Help with stairs or ramp for entrance;A little help with walking and/or transfers   Equipment Recommendations  Wheelchair (measurements PT);Wheelchair cushion (measurements PT) (tbd by next venue of care)    Recommendations for Other Services       Precautions / Restrictions Precautions Precautions: Fall Restrictions Weight Bearing Restrictions: No     Mobility  Bed Mobility               General bed mobility comments: in recliener before  and after    Transfers Overall transfer level: Needs assistance Equipment used: Rolling walker (2 wheels) Transfers: Sit to/from Stand Sit to Stand: Min guard, Min assist           General transfer comment: increased time and attempts, some unsteadiness upon standing requiring min a x 1.    Ambulation/Gait Ambulation/Gait assistance: Min assist Gait Distance (Feet): 8 Feet Assistive  device: Rolling walker (2 wheels) Gait Pattern/deviations: Step-through pattern, Decreased step length - right, Decreased step length - left, Trunk flexed Gait velocity: decreased     General Gait Details: 8' x 2 with slow gait with decreased quality as she fatigues   Stairs             Wheelchair Mobility    Modified Rankin (Stroke Patients Only)       Balance Overall balance assessment: Needs assistance Sitting-balance support: Single extremity supported Sitting balance-Leahy Scale: Fair   Postural control: Posterior lean Standing balance support: Bilateral upper extremity supported Standing balance-Leahy Scale: Poor Standing balance comment: somewhat improved today with less post lean but still requires min a x 1 hands on assist for safety                            Cognition Arousal/Alertness: Awake/alert Behavior During Therapy: WFL for tasks assessed/performed Overall Cognitive Status: Within Functional Limits for tasks assessed                                          Exercises      General Comments        Pertinent Vitals/Pain Pain Assessment Pain Assessment: No/denies pain    Home Living                          Prior Function            PT Goals (current goals can now be found in the care plan section) Progress towards PT goals: Progressing toward goals    Frequency    Min 2X/week      PT Plan      Co-evaluation              AM-PAC PT "6 Clicks" Mobility   Outcome Measure  Help needed turning from your back to your side while in a flat bed without using bedrails?: A Little Help needed moving from lying on your back to sitting on the side of a flat bed without using bedrails?: A Lot Help needed moving to and from a bed to a chair (including a wheelchair)?: A Little Help needed standing up from a chair using your arms (e.g., wheelchair or bedside chair)?: A Little Help needed to walk  in hospital room?: A Little Help needed climbing 3-5 steps with a railing? : Total 6 Click Score: 15    End of Session Equipment Utilized During Treatment: Gait belt Activity Tolerance: Patient limited by fatigue Patient left: in bed;with bed alarm set Nurse Communication: Mobility status PT Visit Diagnosis: Other abnormalities of gait and mobility (R26.89);Muscle weakness (generalized) (M62.81)     Time: 6734-1937 PT Time Calculation (min) (ACUTE ONLY): 17 min  Charges:  $Gait Training: 8-22 mins                   Jace Dowe, PTA 09/11/21, 3:36 PM

## 2021-09-11 NOTE — Assessment & Plan Note (Signed)
Estimated body mass index is 36.91 kg/m as calculated from the following:   Height as of this encounter: 5' (1.524 m).   Weight as of this encounter: 85.7 kg.   -Patient was counseled.

## 2021-09-11 NOTE — Assessment & Plan Note (Signed)
Patient with history of anemia of chronic disease.  Hemoglobin decreased to 10.6 today.  No obvious bleeding. -Restart home iron supplement -Continue to monitor-transfuse if below 7

## 2021-09-11 NOTE — Progress Notes (Signed)
Progress Note   Patient: Taylor [name] SNK:[id number] DOB: 22-May-1932 DOA: 09/09/2021     1 DOS: the patient was seen and examined on 09/11/2021   Brief hospital course: SAPRINA CHUONG is a 88 y.o. female with medical history significant for DM, HTN, chronic pain on chronic opiates who presented by EMS with sudden onset vision change of uncertain time of onset.  She has associated dizziness.  She denied one-sided weakness numbness or tingling with the caveat that history taking limited due to patient being a difficult historian ED course and data review: BP in the ED with systolic 937T to 024 and tachycardic to 108 with otherwise normal vitals Labs: Mostly unremarkable with creatinine at baseline and hemoglobin 11.8 otherwise unremarkable EKG personally reviewed was nonacute CT head with no acute intracranial abnormality Teleneurology was consulted from the ED for stroke evaluation and they recommended admission for stroke work-up.  MRI brain was without any acute abnormality, did note chronic left frontal meningioma that was essentially unchanged.  Dizziness resolved.  Concern of possible hypertensive emergency versus TIA.  Patient was missing doses of blood pressure medications which were restarted with improvement in blood pressure.  Physical therapy evaluation recommending rehab.  7/24: Patient remained stable.  Decrease in hemoglobin to 10.6, no obvious bleeding.  No bowel movement for the past couple of days.  Patient would like to go home with home health services which were ordered.  Family requested another PT treatment and would like to be discharged home tomorrow.   Assessment and Plan: * Acute focal neurological deficit Patient initially admitted with concern of acute onset blurry vision and dizziness, initial concern of CVA but CT head and MRI was without any acute abnormality.  Patient appears to be at baseline now.  MRI did show a chronic left frontal meningioma that was  essentially unchanged.  Dizziness resolved.. Symptoms concerning for secondary to hypertensive emergency. CVA work-up completed. PT/OT recommending SNF but patient would like to go back home with home health services.  Hypertensive emergency Resolved.  Reports has missed few doses of blood pressure medications.  His symptoms of blurry vision and dizziness are most likely secondary to markedly elevated blood pressure on admission. Blood pressure currently within goal. -Continue amlodipine and irbesartan  Diabetes mellitus without complication (HCC) -Sliding scale insulin coverage  Chronic pain Chronic opiates Continue home oxycodone as needed  Chronic kidney disease, stage III (moderate) (HCC) Patient with history of CKD stage IIIa, creatinine currently at baseline. -Monitor renal function -Avoid nephrotoxins  Anemia, unspecified Patient with history of anemia of chronic disease.  Hemoglobin decreased to 10.6 today.  No obvious bleeding. -Restart home iron supplement -Continue to monitor-transfuse if below 7  Obesity (BMI 30-39.9) Estimated body mass index is 36.91 kg/m as calculated from the following:   Height as of this encounter: 5' (1.524 m).   Weight as of this encounter: 85.7 kg.   -Patient was counseled.   Subjective: Patient was seen and examined today.  No new complaints.  She was sitting comfortably in chair.  Denies any obvious bleeding, hematemesis or melena.  Daughter at bedside.  She wants to go home instead of rehab.  Physical Exam: Vitals:   09/11/21 0106 09/11/21 0348 09/11/21 0745 09/11/21 1204  BP:  (!) 152/83 131/72 (!) 152/64  Pulse:   84 90  Resp: '17 19 16 18  '$ Temp:  98.2 F (36.8 C) 98.6 F (37 C) 98.8 F (37.1 C)  TempSrc:  Oral    SpO2:  98% 98% 99%  Weight:      Height: 5' (1.524 m)      General.  Well-developed elderly lady, in no acute distress. Pulmonary.  Lungs clear bilaterally, normal respiratory effort. CV.  Regular rate and  rhythm, no JVD, rub or murmur. Abdomen.  Soft, nontender, nondistended, BS positive. CNS.  Alert and oriented .  No focal neurologic deficit. Extremities.  No edema, no cyanosis, pulses intact and symmetrical. Psychiatry.  Judgment and insight appears normal.  Data Reviewed: Prior data reviewed  Family Communication: Discussed with daughter at bedside  Disposition: Status is: Inpatient Remains inpatient appropriate because: Severity of illness, will be discharged tomorrow with home health services as she does not want to go to rehab anymore.   Planned Discharge Destination: Home with Home Health  DVT prophylaxis.  Lovenox Time spent: 45 minutes  This record has been created using Systems analyst. Errors have been sought and corrected,but may not always be located. Such creation errors do not reflect on the standard of care.  Author: Lorella Nimrod, MD 09/11/2021 2:40 PM  For on call review [email or web address].

## 2021-09-11 NOTE — Plan of Care (Signed)
Neurology plan of care  Patient was seen by teleneurology this AM for blurry vision and lightheadedness. Suspect sx were secondary to hypertensive emergency. Her BP was 170s-190s on arrival and sx resolved with BP control. She did undergo TIA workup which did not e/o acute infarct but did show known large L frontal meningioma that was unchanged (personal review). MRA H&N showed no hemodynamically significant stenosis. TTE showed no e/o intracardiac clot. Here LDL is at goal and she may continue her home pravastatin '40mg'$  daily. Because I have low suspicion for stroke as an etiology for her sx she can be discharged on ASA '81mg'$  daily platelet monotherapy. She may f/u with her PCP.  She follows with Dr. Izora Ribas for the meningioma and he has recommended outpatient f/u for this again in August.  Neurology to sign off, but please re-engage if additional neurologic concerns arise.  Su Monks, MD [facility] [phone]  If 7pm- 7am, please page neurology on call as listed in [place].

## 2021-09-11 NOTE — Plan of Care (Deleted)
Neurology plan of care  Patient was seen by teleneurology this AM for blurry vision and lightheadedness. Suspect sx were secondary to hypertensive emergency. Her BP was 170s-190s on arrival and sx resolved with BP control. She did undergo TIA workup which did not e/o acute infarct but did show known large L frontal meningioma that was unchanged (personal review). MRA H&N showed no hemodynamically significant stenosis. TTE showed no e/o intracardiac clot. Here LDL is at goal and she may continue her home pravastatin '40mg'$  daily. Because I have low suspicion for stroke as an etiology for her sx she can be discharged on ASA '81mg'$  daily platelet monotherapy. She may f/u with her PCP.  She follows with Dr. Izora Ribas for the meningioma and he has recommended outpatient f/u for this again in August.  Neurology to sign off, but please re-engage if additional neurologic concerns arise.  Su Monks, MD [facility] [phone]  If 7pm- 7am, please page neurology on call as listed in [place].

## 2021-09-12 DIAGNOSIS — R29818 Other symptoms and signs involving the nervous system: Secondary | ICD-10-CM | POA: Diagnosis not present

## 2021-09-12 LAB — GLUCOSE, CAPILLARY
Glucose-Capillary: 123 mg/dL — ABNORMAL HIGH (ref 70–99)
Glucose-Capillary: 177 mg/dL — ABNORMAL HIGH (ref 70–99)

## 2021-09-12 MED ORDER — AMLODIPINE BESYLATE 10 MG PO TABS: 10.0000 mg | ORAL_TABLET | Freq: Every day | ORAL | 0 refills | Status: DC

## 2021-09-12 MED ORDER — CEPHALEXIN 500 MG PO CAPS
500.0000 mg | ORAL_CAPSULE | Freq: Two times a day (BID) | ORAL | Status: DC
  Administered 2021-09-12: 500 mg via ORAL
  Filled 2021-09-12: qty 1

## 2021-09-12 MED ORDER — ASPIRIN 81 MG PO CHEW: 81.0000 mg | CHEWABLE_TABLET | Freq: Every day | ORAL | 3 refills | Status: DC

## 2021-09-12 MED ORDER — FLEET ENEMA 7-19 GM/118ML RE ENEM
1.0000 | ENEMA | Freq: Once | RECTAL | Status: AC
Start: 2021-09-12 — End: 2021-09-12
  Administered 2021-09-12: 1 via RECTAL

## 2021-09-12 MED ORDER — CEPHALEXIN 500 MG PO CAPS: 500.0000 mg | ORAL_CAPSULE | Freq: Two times a day (BID) | ORAL | 0 refills | Status: AC

## 2021-09-12 NOTE — TOC Progression Note (Addendum)
Transition of Care [facility]) - Progression Note    Patient Details  Name: Taylor [name] MRN: [id number] Date of Birth: 1932/02/20  Transition of [facility]) CM/SW Contact  Eileen Stanford, LCSW Phone Number: 09/12/2021, 12:36 PM  Clinical Narrative:   CSW met with pt at bedside and pt is interested in [facility] now. Pt states if she can not go to [facility] she wants to dc home. CSW has sent referral.  Per Tammy at [facility] pt has a open MSP case so the business office is looking into that- per Tammy this can take 5 days.   Unable to keep pt at hospital for 5 days pending decision. Pt's daughter updated and agreeable for pt to dc home. Pt's daughter will transport pt home.   HH arranged through [facility] and wheelchair ordered through [place].    Expected Discharge Plan and Services                                                 Social Determinants of Health (SDOH) Interventions    Readmission Risk Interventions     No data to display

## 2021-09-12 NOTE — TOC Progression Note (Signed)
Transition of Care [facility]) - Progression Note    Patient Details  Name: Taylor [name] MRN: [id number] Date of Birth: 1932-09-14  Transition of Care [facility]) CM/SW [place], LCSW Phone Number: 09/12/2021, 11:05 AM  Clinical Narrative: [facility] will service PT OT SW and HHA. Wheelchair ordered through [place].            Expected Discharge Plan and Services                                                 Social Determinants of Health (SDOH) Interventions    Readmission Risk Interventions     No data to display

## 2021-09-12 NOTE — Clinical Social Work Note (Signed)
    Durable Medical Equipment  (From admission, onward)           Start     Ordered   09/12/21 1019  For home use only DME lightweight manual wheelchair with seat cushion  Once       Comments: Patient suffers from dizziness, global weakness and balance impairments which impairs his/her ability to perform daily activities like toileting, feeding, dressing, grooming, bathing in the home. A cane, walker, crutch will not resolve the patient's issue with performing activities of daily living. A lightweight wheelchair and cushion is required/recommended and will allow patient to safely perform daily activities.   Patient can safely propel the wheelchair in the home or has a caregiver who can provide assistance.  Length of need Lifetime.  Accessories: elevating leg rests (ELRs), wheel locks, extensions and anti-tippers.   09/12/21 1020

## 2021-09-12 NOTE — Progress Notes (Signed)
Occupational Therapy Treatment Patient Details Name: Taylor [name] MRN: [id number] DOB: 1932-10-02 Today's Date: 09/12/2021   History of present illness Pt is an 88 year old female with PMH including HTN, HLD, diabetes, and chronic back pain who presented to ED with dizziness and feeling unwell. Patient was hypertensive with systolic BP in the 836'O.  Head CT scan revealed known meningioma along the high left frontal convexity, but neurology recommended workup of this be done outpatient as planned.   OT comments  Taylor [name] presents with generalized weakness, low vision, and impaired cognition, impacting her safety and independence in ADLs. During today's OT session, pt required Mod A x 4 attempts to come into standing from low recliner. She seemed confused about how to use the RW and had difficulty following instructions or problem solving re: safe use, with no improvement during subsequent attempts. During initial standing attempt, pt expressed fear of falling, required Max A for maintaining balance, and requested to return to sitting almost immediately. With additional trials, her confidence and balance improved, and by end of session Taylor [name] was able to remain in standing for > 5 minutes, with Min A, weight-shifting, and short-distance forward and backward movements. Pt would be unable to manage safely at home alone at this time. DC to SNF remains appropriate.   Recommendations for follow up therapy are one component of a multi-disciplinary discharge planning process, led by the attending physician.  Recommendations may be updated based on patient status, additional functional criteria and insurance authorization.    Follow Up Recommendations  Skilled nursing-short term rehab (<3 hours/day)    Assistance Recommended at Discharge Frequent or constant Supervision/Assistance  Patient can return home with the following  A lot of help with walking and/or transfers;A lot of help with  bathing/dressing/bathroom;Assistance with cooking/housework;Direct supervision/assist for medications management;Direct supervision/assist for financial management;Assist for transportation;Help with stairs or ramp for entrance   Equipment Recommendations       Recommendations for Other Services      Precautions / Restrictions Precautions Precautions: Fall Restrictions Weight Bearing Restrictions: No       Mobility Bed Mobility Overal bed mobility: Needs Assistance             General bed mobility comments: in recliner before and after session    Transfers Overall transfer level: Needs assistance Equipment used: Rolling walker (2 wheels) Transfers: Sit to/from Stand Sit to Stand: Mod assist           General transfer comment: Mod A to come up into standing with RW, cueing for hand placement on RW     Balance Overall balance assessment: Needs assistance Sitting-balance support: Single extremity supported Sitting balance-Leahy Scale: Good     Standing balance support: Bilateral upper extremity supported Standing balance-Leahy Scale: Poor Standing balance comment: Min-Mod A for safety to maintain standing balance                           ADL either performed or assessed with clinical judgement   ADL Overall ADL's : Needs assistance/impaired Eating/Feeding: Sitting;Supervision/ safety                                          Extremity/Trunk Assessment Upper Extremity Assessment Upper Extremity Assessment: Generalized weakness   Lower Extremity Assessment Lower Extremity Assessment: Generalized weakness  Vision Baseline Vision/History: 5 Retinopathy;3 Glaucoma Ability to See in Adequate Light: 3 Highly impaired Patient Visual Report: No change from baseline     Perception     Praxis      Cognition Arousal/Alertness: Awake/alert Behavior During Therapy: WFL for tasks assessed/performed Overall Cognitive  Status: Within Functional Limits for tasks assessed                                 General Comments: Grossly oriented, though short term memory deficits noted        Exercises Other Exercises Other Exercises: Educ re: falls prevention, DC recs    Shoulder Instructions       General Comments      Pertinent Vitals/ Pain       Pain Assessment Pain Assessment: Faces Faces Pain Scale: Hurts a little bit Pain Location: R foot Pain Descriptors / Indicators: Aching Pain Intervention(s): Repositioned  Home Living                                          Prior Functioning/Environment              Frequency  Min 2X/week        Progress Toward Goals  OT Goals(current goals can now be found in the care plan section)  Progress towards OT goals: Progressing toward goals  Acute Rehab OT Goals OT Goal Formulation: With patient Time For Goal Achievement: 09/24/21 Potential to Achieve Goals: [place] Discharge plan remains appropriate;Frequency remains appropriate    Co-evaluation                 AM-PAC OT "6 Clicks" Daily Activity     Outcome Measure   Help from another person eating meals?: A Little Help from another person taking care of personal grooming?: A Little Help from another person toileting, which includes using toliet, bedpan, or urinal?: A Lot Help from another person bathing (including washing, rinsing, drying)?: A Lot Help from another person to put on and taking off regular upper body clothing?: A Little Help from another person to put on and taking off regular lower body clothing?: A Lot 6 Click Score: 15    End of Session Equipment Utilized During Treatment: Rolling walker (2 wheels)  OT Visit Diagnosis: Muscle weakness (generalized) (M62.81);Low vision, both eyes (H54.2);Unsteadiness on feet (R26.81)   Activity Tolerance Patient tolerated treatment well   Patient Left in chair;with call bell/phone  within reach;with chair alarm set;Other (comment) (lab techs in room)   Nurse Communication          Time: [id number] OT Time Calculation (min): 21 min  Charges: OT General Charges $OT Visit: 1 Visit OT Treatments $Self Care/Home Management : 8-22 mins  Josiah Lobo, PhD, MS, OTR/L 09/12/21, 3:44 PM

## 2021-09-12 NOTE — Discharge Summary (Signed)
Physician Discharge Summary   Patient: Taylor [name] MRN: [id number] DOB: [birth year]  Admit date:     09/09/2021  Discharge date: 09/12/21  Discharge Physician: Lorella Nimrod   PCP: [name], MD   Recommendations at discharge:  Please obtain CBC and BMP in 1 week Follow-up with primary care provider within a week  Discharge Diagnoses: Principal Problem:   Acute focal neurological deficit Active Problems:   Hypertensive emergency   Diabetes mellitus without complication (HCC)   Chronic pain   Chronic kidney disease, stage III (moderate) (HCC)   Anemia, unspecified   Obesity (BMI 30-39.9)   Hospital Course: Taylor [name] is a 88 y.o. female with medical history significant for DM, HTN, chronic pain on chronic opiates who presented by EMS with sudden onset vision change of uncertain time of onset.  She has associated dizziness.  She denied one-sided weakness numbness or tingling with the caveat that history taking limited due to patient being a difficult historian ED course and data review: BP in the ED with systolic 664Q to 034 and tachycardic to 108 with otherwise normal vitals Labs: Mostly unremarkable with creatinine at baseline and hemoglobin 11.8 otherwise unremarkable EKG personally reviewed was nonacute CT head with no acute intracranial abnormality Teleneurology was consulted from the ED for stroke evaluation and they recommended admission for stroke work-up.  MRI brain was without any acute abnormality, did note chronic left frontal meningioma that was essentially unchanged.  Dizziness resolved.  Concern of possible hypertensive emergency versus TIA.  Patient was missing doses of blood pressure medications which were restarted with improvement in blood pressure.  Physical therapy evaluation recommending rehab.  7/24: Patient remained stable.  Decrease in hemoglobin to 10.6, no obvious bleeding.  No bowel movement for the past couple of days.  Patient would like to  go home with home health services which were ordered.  Family requested another PT treatment and would like to be discharged home tomorrow.  7/25: Remains stable.  Blood pressure mildly elevated, increasing the home dose of amlodipine from 5-10 mg daily.  Patient will continue the rest of her home medications and follow-up with her primary care provider closely for further management.  PT recommendations remained for rehab.  [facility], on they have some sort of MSP case and will take at least 5 business days to determine the eligibility.  Patient would like to go back to home with home health services which were ordered.  Family will provide rest of the care as needed.  Assessment and Plan: * Acute focal neurological deficit Patient initially admitted with concern of acute onset blurry vision and dizziness, initial concern of CVA but CT head and MRI was without any acute abnormality.  Patient appears to be at baseline now.  MRI did show a chronic left frontal meningioma that was essentially unchanged.  Dizziness resolved.. Symptoms concerning for secondary to hypertensive emergency. CVA work-up completed. PT/OT recommending SNF but patient would like to go back home with home health services.  Hypertensive emergency Resolved.  Reports has missed few doses of blood pressure medications.  His symptoms of blurry vision and dizziness are most likely secondary to markedly elevated blood pressure on admission. Blood pressure currently within goal. -Continue amlodipine and irbesartan  Diabetes mellitus without complication (HCC) -Sliding scale insulin coverage  Chronic pain Chronic opiates Continue home oxycodone as needed  Chronic kidney disease, stage III (moderate) (HCC) Patient with history of CKD stage IIIa, creatinine  currently at baseline. -Monitor renal function -Avoid nephrotoxins  Anemia, unspecified Patient with history of anemia of chronic  disease.  Hemoglobin decreased to 10.6 today.  No obvious bleeding. -Restart home iron supplement -Continue to monitor-transfuse if below 7  Obesity (BMI 30-39.9) Estimated body mass index is 36.91 kg/m as calculated from the following:   Height as of this encounter: 5' (1.524 m).   Weight as of this encounter: 85.7 kg.   -Patient was counseled.   Consultants: None Procedures performed: None Disposition: Home health Diet recommendation:  Discharge Diet Orders (From admission, onward)     Start     Ordered   09/12/21 0000  Diet - low sodium heart healthy        09/12/21 1514           Cardiac and Carb modified diet DISCHARGE MEDICATION: Allergies as of 09/12/2021       Reactions   Ace Inhibitors Cough        Medication List     STOP taking these medications    brimonidine-timolol 0.2-0.5 % ophthalmic solution Commonly known as: COMBIGAN   brinzolamide 1 % ophthalmic suspension Commonly known as: AZOPT       TAKE these medications    acetaminophen 500 MG tablet Commonly known as: TYLENOL Take 1 tablet (500 mg total) by mouth 3 (three) times daily. What changed:  when to take this reasons to take this Another medication with the same name was removed. Continue taking this medication, and follow the directions you see here.   amLODipine 10 MG tablet Commonly known as: NORVASC Take 1 tablet (10 mg total) by mouth daily. What changed:  medication strength how much to take   aspirin 81 MG chewable tablet Chew 1 tablet (81 mg total) by mouth daily.   brimonidine 0.2 % ophthalmic solution Commonly known as: ALPHAGAN Place 1 drop into both eyes 2 (two) times daily.   cephALEXin 500 MG capsule Commonly known as: KEFLEX Take 1 capsule (500 mg total) by mouth 2 (two) times daily for 3 days.   Docusate Sodium 100 MG capsule Take 100 mg by mouth 2 (two) times daily.   dorzolamide 2 % ophthalmic solution Commonly known as: TRUSOPT 1 drop 2 (two)  times daily.   famotidine 20 MG tablet Commonly known as: PEPCID TAKE 1 TABLET BY MOUTH EVERY DAY   ferrous sulfate 325 (65 FE) MG tablet TAKE 1 TABLET BY MOUTH EVERY DAY WITH BREAKFAST   fluticasone 50 MCG/ACT nasal spray Commonly known as: FLONASE SPRAY 2 SPRAYS INTO EACH NOSTRIL EVERY DAY   lactulose 10 GM/15ML solution Commonly known as: CHRONULAC Take 30 mLs (20 g total) by mouth daily as needed for mild constipation.   lubiprostone 24 MCG capsule Commonly known as: AMITIZA TAKE 1 CAPSULE (24 MCG TOTAL) BY MOUTH 2 (TWO) TIMES DAILY WITH A MEAL.   metFORMIN 750 MG 24 hr tablet Commonly known as: GLUCOPHAGE-XR TAKE 2 TABLETS (1,500 MG TOTAL) BY MOUTH DAILY WITH BREAKFAST.   olmesartan 20 MG tablet Commonly known as: BENICAR TAKE 1 TABLET BY MOUTH EVERY DAY   omega-3 acid ethyl esters 1 g capsule Commonly known as: LOVAZA TAKE 1 CAPSULE BY MOUTH TWICE A DAY   OneTouch Delica Plus [id number] Misc 1 each by Does not apply route daily.   OneTouch Ultra test strip Generic drug: glucose blood USE AS DIRECTED   oxyCODONE-acetaminophen 7.5-325 MG tablet Commonly known as: Percocet Take 1 tablet by mouth every 8 (eight) hours as  needed for moderate pain or severe pain.   oxyCODONE-acetaminophen 7.5-325 MG tablet Commonly known as: Percocet Take 1 tablet by mouth every 8 (eight) hours as needed for moderate pain or severe pain. Start taking on: October 11, 2021   OYSTER SHELL CALCIUM 500 + D PO Take 1 tablet by mouth 2 (two) times a day.   pravastatin 40 MG tablet Commonly known as: PRAVACHOL TAKE 1 TABLET BY MOUTH EVERYDAY AT BEDTIME   Prolia 60 MG/ML Sosy injection Generic drug: denosumab Inject into the skin.   Rocklatan 0.02-0.005 % Soln Generic drug: Netarsudil-Latanoprost Apply 1 drop to eye at bedtime.   Vitamin D (Ergocalciferol) 1.25 MG (50000 UNIT) Caps capsule Commonly known as: DRISDOL Take 50,000 Units by mouth every 7 (seven) days.                Durable Medical Equipment  (From admission, onward)           Start     Ordered   09/12/21 1019  For home use only DME lightweight manual wheelchair with seat cushion  Once       Comments: Patient suffers from dizziness, global weakness and balance impairments which impairs his/her ability to perform daily activities like toileting, feeding, dressing, grooming, bathing in the home. A cane, walker, crutch will not resolve the patient's issue with performing activities of daily living. A lightweight wheelchair and cushion is required/recommended and will allow patient to safely perform daily activities.   Patient can safely propel the wheelchair in the home or has a caregiver who can provide assistance.  Length of need Lifetime.  Accessories: elevating leg rests (ELRs), wheel locks, extensions and anti-tippers.   09/12/21 1020            Follow-up Information     [name], MD. Schedule an appointment as soon as possible for a visit in 1 week(s).   Specialty: Family Medicine Contact information: [address] [place] [address] [phone]                Discharge Exam: Danley Danker Weights   09/10/21 0007  Weight: 85.7 kg   General.     In no acute distress. Pulmonary.  Lungs clear bilaterally, normal respiratory effort. CV.  Regular rate and rhythm, no JVD, rub or murmur. Abdomen.  Soft, nontender, nondistended, BS positive. CNS.  Alert and oriented .  No focal neurologic deficit. Extremities.  No edema, no cyanosis, pulses intact and symmetrical. Psychiatry.  Judgment and insight appears normal.   Condition at discharge: stable  The results of significant diagnostics from this hospitalization (including imaging, microbiology, ancillary and laboratory) are listed below for reference.   Imaging Studies: MR ANGIO HEAD WO CONTRAST  Result Date: 09/10/2021 CLINICAL DATA:  Sudden onset vision change EXAM: MRA NECK WITHOUT AND WITH  CONTRAST MRA HEAD WITHOUT CONTRAST TECHNIQUE: Multiplanar and multiecho pulse sequences of the neck were obtained without and with intravenous contrast. Angiographic images of the neck were obtained using MRA technique without and with intravenous contrast; Angiographic images of the Circle of Willis were obtained using MRA technique without intravenous contrast. CONTRAST:  7.66m GADAVIST GADOBUTROL 1 MMOL/ML IV SOLN COMPARISON:  None Available. FINDINGS: MRA NECK FINDINGS There is a normal 3 vessel branch pattern of the aorta. Both carotid systems are normal. No evidence of dissection or stenosis. Vertebral system is codominant and normal. MRA HEAD FINDINGS POSTERIOR CIRCULATION: --Vertebral arteries: Normal --Inferior cerebellar arteries: Normal. --Basilar artery: Normal. --Superior cerebellar arteries: Normal. --  Posterior cerebral arteries: Normal. ANTERIOR CIRCULATION: --Intracranial internal carotid arteries: Normal. --Anterior cerebral arteries (ACA): Normal. --Middle cerebral arteries (MCA): Normal. ANATOMIC VARIANTS: None IMPRESSION: Normal MRA of the head and neck. Electronically Signed   By: Ulyses Jarred M.D.   On: 09/10/2021 21:03   MR ANGIO NECK W WO CONTRAST  Result Date: 09/10/2021 CLINICAL DATA:  Sudden onset vision change EXAM: MRA NECK WITHOUT AND WITH CONTRAST MRA HEAD WITHOUT CONTRAST TECHNIQUE: Multiplanar and multiecho pulse sequences of the neck were obtained without and with intravenous contrast. Angiographic images of the neck were obtained using MRA technique without and with intravenous contrast; Angiographic images of the Circle of Willis were obtained using MRA technique without intravenous contrast. CONTRAST:  7.57m GADAVIST GADOBUTROL 1 MMOL/ML IV SOLN COMPARISON:  None Available. FINDINGS: MRA NECK FINDINGS There is a normal 3 vessel branch pattern of the aorta. Both carotid systems are normal. No evidence of dissection or stenosis. Vertebral system is codominant and normal. MRA  HEAD FINDINGS POSTERIOR CIRCULATION: --Vertebral arteries: Normal --Inferior cerebellar arteries: Normal. --Basilar artery: Normal. --Superior cerebellar arteries: Normal. --Posterior cerebral arteries: Normal. ANTERIOR CIRCULATION: --Intracranial internal carotid arteries: Normal. --Anterior cerebral arteries (ACA): Normal. --Middle cerebral arteries (MCA): Normal. ANATOMIC VARIANTS: None IMPRESSION: Normal MRA of the head and neck. Electronically Signed   By: KUlyses JarredM.D.   On: 09/10/2021 21:03   ECHOCARDIOGRAM COMPLETE  Result Date: 09/10/2021    ECHOCARDIOGRAM REPORT   Patient Name:   SLUCILL MAUCKDate of Exam: 09/10/2021 Medical Rec #:  [id number]     Height:       60.0 in Accession #:    [id number]    Weight:       189.0 lb Date of Birth:  108-19-1934    BSA:          1.822 m Patient Age:    89years       BP:           199/90 mmHg Patient Gender: F              HR:           100 bpm. Exam Location:  [facility] Procedure: 2D Echo Indications:     Stroke I63.9  History:         Patient has no prior history of Echocardiogram examinations.  Sonographer:     TKathlen BrunswickRDCS Referring Phys:  [id number] MasseDiagnosing Phys: TSkeet LatchMD IMPRESSIONS  1. Left ventricular ejection fraction, by estimation, is 55 to 60%. The left ventricle has normal function. The left ventricle has no regional wall motion abnormalities. There is mild left ventricular hypertrophy. Left ventricular diastolic parameters are consistent with Grade I diastolic dysfunction (impaired relaxation).  2. Right ventricular systolic function is normal. The right ventricular size is normal.  3. The mitral valve is normal in structure. No evidence of mitral valve regurgitation. No evidence of mitral stenosis.  4. The aortic valve was not well visualized. There is mild calcification of the aortic valve. There is mild thickening of the aortic valve. Aortic valve regurgitation is not visualized. No aortic stenosis is present.   5. The inferior vena cava is normal in size with greater than 50% respiratory variability, suggesting right atrial pressure of 3 mmHg. FINDINGS  Left Ventricle: Left ventricular ejection fraction, by estimation, is 55 to 60%. The left ventricle has normal function. The left ventricle has no regional wall motion abnormalities. The left ventricular internal  cavity size was normal in size. There is  mild left ventricular hypertrophy. Left ventricular diastolic parameters are consistent with Grade I diastolic dysfunction (impaired relaxation). Indeterminate filling pressures. Right Ventricle: The right ventricular size is normal. No increase in right ventricular wall thickness. Right ventricular systolic function is normal. Left Atrium: Left atrial size was normal in size. Right Atrium: Right atrial size was normal in size. Pericardium: There is no evidence of pericardial effusion. Mitral Valve: The mitral valve is normal in structure. No evidence of mitral valve regurgitation. No evidence of mitral valve stenosis. Tricuspid Valve: The tricuspid valve is normal in structure. Tricuspid valve regurgitation is trivial. No evidence of tricuspid stenosis. Aortic Valve: The aortic valve was not well visualized. There is mild calcification of the aortic valve. There is mild thickening of the aortic valve. Aortic valve regurgitation is not visualized. No aortic stenosis is present. Aortic valve peak gradient  measures 9.2 mmHg. Pulmonic Valve: The pulmonic valve was normal in structure. Pulmonic valve regurgitation is not visualized. No evidence of pulmonic stenosis. Aorta: The aortic root is normal in size and structure. Venous: The inferior vena cava is normal in size with greater than 50% respiratory variability, suggesting right atrial pressure of 3 mmHg. IAS/Shunts: No atrial level shunt detected by color flow Doppler.  LEFT VENTRICLE PLAX 2D LVIDd:         3.96 cm     Diastology LVIDs:         2.83 cm     LV e' medial:     6.31 cm/s LV PW:         0.92 cm     LV E/e' medial:  11.1 LV IVS:        1.23 cm     LV e' lateral:   6.85 cm/s LVOT diam:     1.80 cm     LV E/e' lateral: 10.2 LV SV:         52 LV SV Index:   28 LVOT Area:     2.54 cm  LV Volumes (MOD) LV vol d, MOD A2C: 45.8 ml LV vol d, MOD A4C: 66.9 ml LV vol s, MOD A2C: 21.8 ml LV vol s, MOD A4C: 26.9 ml LV SV MOD A2C:     24.0 ml LV SV MOD A4C:     66.9 ml LV SV MOD BP:      30.5 ml RIGHT VENTRICLE RV Basal diam:  2.39 cm RV S prime:     16.60 cm/s TAPSE (M-mode): 2.1 cm LEFT ATRIUM             Index        RIGHT ATRIUM           Index LA diam:        3.30 cm 1.81 cm/m   RA Area:     11.60 cm LA Vol (A2C):   25.4 ml 13.94 ml/m  RA Volume:   25.10 ml  13.77 ml/m LA Vol (A4C):   19.9 ml 10.92 ml/m LA Biplane Vol: 23.1 ml 12.68 ml/m  AORTIC VALVE                 PULMONIC VALVE AV Area (Vmax): 1.65 cm     PV Vmax:       1.22 m/s AV Vmax:        152.00 cm/s  PV Peak grad:  6.0 mmHg AV Peak Grad:   9.2 mmHg LVOT Vmax:  98.30 cm/s LVOT Vmean:     66.200 cm/s LVOT VTI:       0.203 m  AORTA Ao Root diam: 2.30 cm MITRAL VALVE                TRICUSPID VALVE MV Area (PHT): 5.88 cm     TV Peak grad:   34.3 mmHg MV Decel Time: 129 msec     TV Vmax:        2.93 m/s MV E velocity: 69.80 cm/s   TR Peak grad:   14.9 mmHg MV A velocity: 134.00 cm/s  TR Vmax:        193.00 cm/s MV E/A ratio:  0.52                             SHUNTS                             Systemic VTI:  0.20 m                             Systemic Diam: 1.80 cm Skeet Latch MD Electronically signed by Skeet Latch MD Signature Date/Time: 09/10/2021/1:05:41 PM    Final    MR BRAIN WO CONTRAST  Result Date: 09/10/2021 CLINICAL DATA:  Hypertension and dizziness EXAM: MRI HEAD WITHOUT CONTRAST TECHNIQUE: Multiplanar, multiecho pulse sequences of the brain and surrounding structures were obtained without intravenous contrast. COMPARISON:  Head CT from earlier today FINDINGS: Brain: No acute infarction,  hemorrhage, hydrocephalus, extra-axial collection. Mild for age chronic small vessel ischemia in the periventricular white matter. Mild for age cerebral atrophy. Long-standing extra-axial mass along the left frontal convexity measuring up to 4 cm and causing pronounced cortical mass effect but no vasogenic edema. Vascular: Major flow voids are preserved Skull and upper cervical spine: Normal marrow signal Sinuses/Orbits: Bilateral cataract resection IMPRESSION: 1. No acute finding. 2. Known nearly 4 cm meningioma along the high left frontal convexity. 3. Aging brain. Electronically Signed   By: Jorje Guild M.D.   On: 09/10/2021 06:27   CT HEAD WO CONTRAST (5MM)  Result Date: 09/10/2021 CLINICAL DATA:  Acute neurologic deficit EXAM: CT HEAD WITHOUT CONTRAST TECHNIQUE: Contiguous axial images were obtained from the base of the skull through the vertex without intravenous contrast. RADIATION DOSE REDUCTION: This exam was performed according to the departmental dose-optimization program which includes automated exposure control, adjustment of the mA and/or kV according to patient size and/or use of iterative reconstruction technique. COMPARISON:  09/05/2020 FINDINGS: Brain: Unchanged anterior left convexity meningioma. Mass effect on the underlying brain with rightward bulging of the falx cerebri is unchanged. There is no acute hemorrhage. There is periventricular hypoattenuation compatible with chronic microvascular disease. Generalized atrophy. Vascular: Negative Skull: Normal Sinuses/Orbits: Clear Other: None IMPRESSION: 1. No acute intracranial abnormality. 2. Unchanged anterior left convexity meningioma. Electronically Signed   By: Ulyses Jarred M.D.   On: 09/10/2021 00:55    Microbiology: Results for orders placed or performed during the hospital encounter of 09/09/21  Resp Panel by RT-PCR (Flu A&B, Covid) Anterior Nasal Swab     Status: None   Collection Time: 09/10/21  1:17 AM   Specimen: Anterior  Nasal Swab  Result Value Ref Range Status   SARS Coronavirus 2 by RT PCR NEGATIVE NEGATIVE Final    Comment: (NOTE) SARS-CoV-2 target nucleic acids are  NOT DETECTED.  The SARS-CoV-2 RNA is generally detectable in upper respiratory specimens during the acute phase of infection. The lowest concentration of SARS-CoV-2 viral copies this assay can detect is 138 copies/mL. A negative result does not preclude SARS-Cov-2 infection and should not be used as the sole basis for treatment or other patient management decisions. A negative result may occur with  improper specimen collection/handling, submission of specimen other than nasopharyngeal swab, presence of viral mutation(s) within the areas targeted by this assay, and inadequate number of viral copies(<138 copies/mL). A negative result must be combined with clinical observations, patient history, and epidemiological information. The expected result is Negative.  Fact Sheet for Patients:  EntrepreneurPulse.com.au  Fact Sheet for Healthcare Providers:  IncredibleEmployment.be  This test is no t yet approved or cleared by the [place] FDA and  has been authorized for detection and/or diagnosis of SARS-CoV-2 by FDA under an Emergency Use Authorization (EUA). This EUA will remain  in effect (meaning this test can be used) for the duration of the COVID-19 declaration under Section 564(b)(1) of the Act, 21 U.S.C.section 360bbb-3(b)(1), unless the authorization is terminated  or revoked sooner.       Influenza A by PCR NEGATIVE NEGATIVE Final   Influenza B by PCR NEGATIVE NEGATIVE Final    Comment: (NOTE) The Xpert Xpress SARS-CoV-2/FLU/RSV plus assay is intended as an aid in the diagnosis of influenza from Nasopharyngeal swab specimens and should not be used as a sole basis for treatment. Nasal washings and aspirates are unacceptable for Xpert Xpress SARS-CoV-2/FLU/RSV testing.  Fact Sheet for  Patients: EntrepreneurPulse.com.au  Fact Sheet for Healthcare Providers: IncredibleEmployment.be  This test is not yet approved or cleared by the [place] FDA and has been authorized for detection and/or diagnosis of SARS-CoV-2 by FDA under an Emergency Use Authorization (EUA). This EUA will remain in effect (meaning this test can be used) for the duration of the COVID-19 declaration under Section 564(b)(1) of the Act, 21 U.S.C. section 360bbb-3(b)(1), unless the authorization is terminated or revoked.  Performed at [facility], [address] [place], [place] [address]     Labs: CBC: Recent Labs  Lab 09/10/21 0117 09/10/21 0730 09/11/21 0422  WBC 7.2 7.8 5.7  NEUTROABS 4.5  --   --   HGB 11.8* 11.6* 10.6*  HCT 35.6* 35.6* 32.4*  MCV 97.8 98.1 97.0  PLT 345 322 235   Basic Metabolic Panel: Recent Labs  Lab 09/10/21 0117 09/10/21 0730 09/11/21 0422  NA 137  --  141  K 4.0  --  4.2  CL 112*  --  110  CO2 19*  --  22  GLUCOSE 139*  --  106*  BUN 21  --  24*  CREATININE 1.46* 1.30* 1.27*  CALCIUM 9.2  --  9.0   Liver Function Tests: Recent Labs  Lab 09/10/21 0117 09/11/21 0422  AST 14* 9*  ALT 8 7  ALKPHOS 32* 32*  BILITOT 0.6 0.7  PROT 7.1 6.2*  ALBUMIN 3.9 3.3*   CBG: Recent Labs  Lab 09/11/21 1630 09/11/21 2021 09/12/21 0739 09/12/21 1155  GLUCAP 196* 128* 123* 177*    Discharge time spent: greater than 30 minutes.  This record has been created using Systems analyst. Errors have been sought and corrected,but may not always be located. Such creation errors do not reflect on the standard of care.   Signed: Lorella Nimrod, MD [facility] 09/12/2021

## 2021-09-12 NOTE — NC FL2 (Signed)
[place] LEVEL OF CARE SCREENING TOOL     IDENTIFICATION  Patient Name: Taylor [name] Birthdate: 1932-07-07 Sex: female Admission Date (Current Location): 09/09/2021  [facility] and [place] Number:  Engineering geologist and Address:  [facility], [address], [place], [place] [address]      Provider Number: [id number]  Attending Physician Name and Address:  Lorella Nimrod, MD  Relative Name and Phone Number:       Current Level of Care: Hospital Recommended Level of Care: [place] Prior Approval Number:    Date Approved/Denied:   PASRR Number:    Discharge Plan: SNF    Current Diagnoses: Patient Active Problem List   Diagnosis Date Noted   Obesity (BMI 30-39.9) 09/11/2021   Hypertensive emergency 09/10/2021   Acute focal neurological deficit 09/10/2021   Dizziness 09/10/2021   Closed compression fracture of body of L1 vertebra ([place]) 09/23/2020   Gastric polyp    Polyp of colon    Morbid obesity ([place]) 04/08/2018   Age-related osteoporosis without current pathological fracture 09/26/2017   Hyperparathyroidism ([place]) 08/06/2017   Chronic kidney disease, stage III (moderate) ([place]) 08/06/2017   HLD (hyperlipidemia) 07/25/2017   Osteoporotic compression fracture of spine ([place]) 03/22/2017   Assistance needed with transportation 03/21/2017   Iron deficiency anemia 12/19/2016   Aortic atherosclerosis ([place]) 11/23/2016   Anterolisthesis 11/23/2016   Vitamin D deficiency 08/19/2016   Anemia, unspecified 08/19/2016   Primary open angle glaucoma (POAG) of both eyes, severe stage 08/17/2016   Hypertensive retinopathy 08/17/2016   Long term current use of opiate analgesic 07/19/2016   Dyslipidemia associated with type 2 diabetes mellitus ([place]) 04/16/2016   Chronic midline low back pain without sciatica 04/16/2016   Chronic pain of right knee 04/16/2016   Vaginal dryness 02/22/2015   Calculus of kidney 11/10/2014    Osteoarthritis, multiple sites 09/09/2014   Type 2 diabetes mellitus with microalbuminuria ([place]) 09/09/2014   Glaucoma 09/09/2014   Benign hypertension with CKD (chronic kidney disease) stage III ([place]) 09/09/2014   Chronic pain 09/09/2014   Tinea corporis 81/85/6314   Umbilical hernia 97/03/6376   Mass of right breast    Diabetes mellitus without complication ([place]) 5885    Orientation RESPIRATION BLADDER Height & Weight     Self, Time, Situation, Place  Normal Incontinent Weight: 189 lb (85.7 kg) Height:  5' (152.4 cm)  BEHAVIORAL SYMPTOMS/MOOD NEUROLOGICAL BOWEL NUTRITION STATUS      Continent Diet (regular, thin liquids)  AMBULATORY STATUS COMMUNICATION OF NEEDS Skin   Limited Assist Verbally Normal                       Personal Care Assistance Level of Assistance  Bathing, Feeding, Dressing Bathing Assistance: Limited assistance Feeding assistance: Independent Dressing Assistance: Limited assistance     Functional Limitations Info  Sight, Hearing, Speech Sight Info: Adequate Hearing Info: Impaired Speech Info: Adequate    SPECIAL CARE FACTORS FREQUENCY  PT (By licensed PT), OT (By licensed OT)     PT Frequency: 5x OT Frequency: 5x            Contractures Contractures Info: Not present    Additional Factors Info  Code Status, Allergies Code Status Info: full code Allergies Info: Ace Inhibitors           Current Medications (09/12/2021):  This is the current hospital active medication list Current Facility-Administered Medications  Medication Dose Route Frequency Provider Last Rate Last  Admin   acetaminophen (TYLENOL) tablet 650 mg  650 mg Oral Q4H PRN Athena Masse, MD   650 mg at 09/11/21 1900   Or   acetaminophen (TYLENOL) 160 MG/5ML solution 650 mg  650 mg Per Tube Q4H PRN Athena Masse, MD       Or   acetaminophen (TYLENOL) suppository 650 mg  650 mg Rectal Q4H PRN Athena Masse, MD       alum & mag hydroxide-simeth (MAALOX/MYLANTA)  200-200-20 MG/5ML suspension 30 mL  30 mL Oral Q4H PRN Ulyess Blossom C, MD   30 mL at 09/11/21 0527   amLODipine (NORVASC) tablet 5 mg  5 mg Oral Daily Lorelei Pont, MD   5 mg at 09/12/21 5027   aspirin chewable tablet 81 mg  81 mg Oral Once Lorelei Pont, MD       brimonidine (ALPHAGAN) 0.2 % ophthalmic solution 1 drop  1 drop Both Eyes BID Judd Gaudier V, MD   1 drop at 09/12/21 0839   cefTRIAXone (ROCEPHIN) 1 g in sodium chloride 0.9 % 100 mL IVPB  1 g Intravenous Q24H Lorelei Pont, MD   Stopped at 09/11/21 2141   docusate sodium (COLACE) capsule 100 mg  100 mg Oral BID Lorella Nimrod, MD   100 mg at 09/12/21 0835   dorzolamide (TRUSOPT) 2 % ophthalmic solution 1 drop  1 drop Both Eyes BID Lorella Nimrod, MD   1 drop at 09/12/21 1102   enoxaparin (LOVENOX) injection 40 mg  40 mg Subcutaneous Q24H Judd Gaudier V, MD   40 mg at 09/12/21 7412   ferrous sulfate tablet 325 mg  325 mg Oral BID WC Lorella Nimrod, MD   325 mg at 09/12/21 0836   fluticasone (FLONASE) 50 MCG/ACT nasal spray 1 spray  1 spray Each Nare Daily Lorella Nimrod, MD   1 spray at 09/12/21 0834   insulin aspart (novoLOG) injection 0-5 Units  0-5 Units Subcutaneous QHS Amin, Soundra Pilon, MD       insulin aspart (novoLOG) injection 0-9 Units  0-9 Units Subcutaneous TID WC Lorella Nimrod, MD   1 Units at 09/12/21 0833   irbesartan (AVAPRO) tablet 150 mg  150 mg Oral Daily Ulyess Blossom C, MD   150 mg at 09/12/21 0835   lubiprostone (AMITIZA) capsule 24 mcg  24 mcg Oral Q breakfast Lorella Nimrod, MD   24 mcg at 09/12/21 0838   polyethylene glycol (MIRALAX / GLYCOLAX) packet 17 g  17 g Oral Daily PRN Lorella Nimrod, MD   17 g at 09/11/21 2103   pravastatin (PRAVACHOL) tablet 40 mg  40 mg Oral Daily Athena Masse, MD   40 mg at 09/12/21 0835   sodium phosphate (FLEET) 7-19 GM/118ML enema 1 enema  1 enema Rectal Once Lorella Nimrod, MD         Discharge Medications: Please see discharge summary for a list of discharge  medications.  Relevant Imaging Results:  Relevant Lab Results:   Additional Information INO:[id number]  Macrae Wiegman A Lavarius Doughten, LCSW

## 2021-09-13 ENCOUNTER — Telehealth: Payer: Self-pay | Admitting: Family Medicine

## 2021-09-13 NOTE — Telephone Encounter (Signed)
Spoke with patient and let her know Dr. Ancil Boozer can't come to her in the hospital. She said she will try to make her 09/18/21 appt. If not she said she will callback and reschedule.

## 2021-09-13 NOTE — Telephone Encounter (Signed)
Copied from [place] [phone]. Topic: General - Call Back - No Documentation >> Sep 12, 2021  3:16 PM Oley Balm E wrote: Reason for CRM: Pt called reporting that she is in the hospital and may need to reschedule if she is not released sooner. Requesting a call back. She wants to know if Dr. Ancil Boozer will come to her in the hospital   Best contact: [phone]

## 2021-09-14 ENCOUNTER — Telehealth: Payer: Self-pay

## 2021-09-14 ENCOUNTER — Telehealth: Payer: Self-pay | Admitting: Family Medicine

## 2021-09-14 NOTE — Telephone Encounter (Signed)
Home Health Verbal Orders - Caller/Agency: Otila Kluver from [facility] Number: [phone] Requesting OT/PT/Skilled Nursing/ Frequency: OT  / Jarome Lamas Theresia Bough nursing 1x9/ medication education compliance/PT 1x1/home health aide 2x6 for bathing and grooming

## 2021-09-14 NOTE — Telephone Encounter (Signed)
Copied from [place] [phone]. Topic: General - Other >> Sep 13, 2021  4:30 PM Everette C wrote: Reason for CRM: The patient has returned a missed call from the practice There were no new notes or encounters at the time of returned call Please contact further.

## 2021-09-15 NOTE — Telephone Encounter (Signed)
Called callback number unable to reach Tina left vm to call back office.

## 2021-09-15 NOTE — Progress Notes (Unsigned)
Name: Taylor [name]   MRN: [id number]    DOB: 1932-04-25   Date:09/18/2021       Progress Note  Subjective  Chief Complaint  Follow Up  HPI  Hospital discharge follow up:   Admission date: July 22 nd, 2023 Discharge date : July 25 th, 2023   She went in because of vision loss of uncertain time frame, some dizziness, during arrival to [facility] bp was very high 170-199 and tachycardic, MRI negative except for stable left frontal meningioma. She was diagnosed with possible hypertensive emergency versus TIA She was admitted for PT and sent home on home PT . Unable to do medication reconciliation, she forgot to bring her medications. She had drop of HCT and we will recheck it today   DM: she forgot to bring her sugar logs, she is taking metformin 750 mg daily, today A 1 C 6.5 %   She denies polyphagia, polydipsia or polyuria. . She has a history of microalbuminuria and has been taking  Benicar 20 mg  She has CKI and is under the care of nephrologist. She takes Lovaza and statin for dyslipidemia and denies side effects. She also has associated Obesity, HTN. No hypoglycemia   Malnutrition she has lost 16 lbs in the past 4 months , discussed high calorie diet    HTN with CKI stage III: also has secondary hyperparathyroidism, last Pth was 93 back in 09/22  under the care of nephrologist, Dr. Candiss Norse. No pruritis noticed, BP today is at goal, during hospital stay they increased dose of norvasc from 5 to 10 mg    Chronic pain: she has chronic right knee pain, under the care of pain clinic and uses a walker to assist with ambulation . She also has daily back pain and feels weak on her back, she is under the care of Dr. Andree Elk , she states she has difficulty taking care of her house, preparing meals, unable to get in her bath tub due to pain on right knee. She still takes pain medication   GERD: well controlled with Pepcid, no heartburn or indigestion, denies vomiting . Unchanged   Chronic constipation: she  only has bowel movements about every two weeks, she still only takes medication prn, advised to take it more often She has Amitiza on active medication list, she states currently only taking it once a day, advised to take it twice daily   Atherosclerosis aorta: on statin therapy not on aspirin because of  high risk of falls Unchanged .    Osteoporosis: seeing Dr. Honor Junes for Prolia and tolerated it well,  next infusion 10/2021   Patient lives alone and is not interested in placement. Daughter lives in [place] and her son is a Administrator and is usually not in town, came in today with a church friend. She cannot see well, difficulty hearing , since hospital discharge her daughter is staying with her and patient thinks she will go to [place] with her. I asked why her daughter did not come with her and she said " Because she didn't want to"  When the patient was in the lab our technician found a bed bug crawling on her clothe and one when she was walking to check out desk. She showed it to be after the patient left. We will notify patient and explain to her again and talk to her son about going to a facility     Patient Active Problem List   Diagnosis Date Noted  Mild protein-calorie malnutrition ([place]) 09/18/2021   GERD without esophagitis 09/18/2021   Obesity (BMI 30-39.9) 09/11/2021   Hypertensive emergency 09/10/2021   Dizziness 09/10/2021   Closed compression fracture of body of L1 vertebra ([place]) 09/23/2020   Gastric polyp    Polyp of colon    Morbid obesity ([place]) 04/08/2018   Age-related osteoporosis without current pathological fracture 09/26/2017   Hyperparathyroidism ([place]) 08/06/2017   Stage 3a chronic kidney disease ([place]) 08/06/2017   HLD (hyperlipidemia) 07/25/2017   Osteoporotic compression fracture of spine ([place]) 03/22/2017   Assistance needed with transportation 03/21/2017   Iron deficiency anemia 12/19/2016   Aortic atherosclerosis ([place]) 11/23/2016   Anterolisthesis  11/23/2016   Vitamin D deficiency 08/19/2016   Anemia, unspecified 08/19/2016   Primary open angle glaucoma (POAG) of both eyes, severe stage 08/17/2016   Hypertensive retinopathy 08/17/2016   Long term current use of opiate analgesic 07/19/2016   Dyslipidemia associated with type 2 diabetes mellitus ([place]) 04/16/2016   Chronic midline low back pain without sciatica 04/16/2016   Chronic pain of right knee 04/16/2016   Vaginal dryness 02/22/2015   Calculus of kidney 11/10/2014   Osteoarthritis, multiple sites 09/09/2014   Type 2 diabetes mellitus with microalbuminuria ([place]) 09/09/2014   Glaucoma 09/09/2014   Benign hypertension with CKD (chronic kidney disease) stage III ([place]) 09/09/2014   Chronic pain 09/09/2014   Tinea corporis 08/15/9483   Umbilical hernia 46/27/0350   Mass of right breast    Diabetes mellitus without complication ([place]) 0938    Past Surgical History:  Procedure Laterality Date   ABDOMINAL HYSTERECTOMY  1958   menorrhagia   BACK SURGERY     BREAST BIOPSY Left 01/25/2012   BREAST BIOPSY Right 04/08/2019   hear clip, [place] bx, path pending   BREAST EXCISIONAL BIOPSY     BREAST LUMPECTOMY WITH NEEDLE LOCALIZATION Right 05/12/2019   Procedure: BREAST LUMPECTOMY WITH NEEDLE LOCALIZATION;  Surgeon: Olean Ree, MD;  Location: [facility] ORS;  Service: General;  Laterality: Right;   CATARACT EXTRACTION, BILATERAL Bilateral    1st 06/07/15 and the 2nd 06/21/15   CHOLECYSTECTOMY     COLONOSCOPY  2003   [facility]   COLONOSCOPY WITH PROPOFOL N/A 07/02/2019   Procedure: COLONOSCOPY WITH PROPOFOL;  Surgeon: Virgel Manifold, MD;  Location: [facility] ENDOSCOPY;  Service: Endoscopy;  Laterality: N/A;   COLONOSCOPY WITH PROPOFOL N/A 07/16/2019   Procedure: COLONOSCOPY WITH PROPOFOL;  Surgeon: Virgel Manifold, MD;  Location: [facility] ENDOSCOPY;  Service: Endoscopy;  Laterality: N/A;   ESOPHAGOGASTRODUODENOSCOPY (EGD) WITH PROPOFOL N/A 07/02/2019   Procedure: ESOPHAGOGASTRODUODENOSCOPY (EGD)  WITH PROPOFOL;  Surgeon: Virgel Manifold, MD;  Location: [facility] ENDOSCOPY;  Service: Endoscopy;  Laterality: N/A;   EYE SURGERY     KNEE SURGERY  2009,2011,2012   twice on right and once on left   [place] SURGERY  2004   TONSILLECTOMY     age of 78    Family History  Problem Relation Age of Onset   Cancer Mother        skin cancer   Stroke Mother    Heart disease Father    Breast cancer Neg Hx     Social History   Tobacco Use   Smoking status: Never   Smokeless tobacco: Never  Substance Use Topics   Alcohol use: No    Alcohol/week: 0.0 standard drinks of alcohol     Current Outpatient Medications:    acetaminophen (TYLENOL)  500 MG tablet, Take 1 tablet (500 mg total) by mouth 3 (three) times daily. (Patient taking differently: Take 500 mg by mouth every 6 (six) hours as needed for moderate pain or headache.), Disp: 90 tablet, Rfl: 0   amLODipine (NORVASC) 10 MG tablet, Take 1 tablet (10 mg total) by mouth daily., Disp: 90 tablet, Rfl: 0   aspirin 81 MG chewable tablet, Chew 1 tablet (81 mg total) by mouth daily., Disp: 30 tablet, Rfl: 3   brimonidine (ALPHAGAN) 0.2 % ophthalmic solution, Place 1 drop into both eyes 2 (two) times daily., Disp: , Rfl:    Calcium Carbonate-Vitamin D (OYSTER SHELL CALCIUM 500 + D PO), Take 1 tablet by mouth 2 (two) times a day., Disp: , Rfl:    denosumab (PROLIA) 60 MG/ML SOSY injection, Inject into the skin., Disp: , Rfl:    Docusate Sodium 100 MG capsule, Take 100 mg by mouth 2 (two) times daily., Disp: , Rfl:    dorzolamide (TRUSOPT) 2 % ophthalmic solution, 1 drop 2 (two) times daily., Disp: , Rfl:    famotidine (PEPCID) 20 MG tablet, TAKE 1 TABLET BY MOUTH EVERY DAY, Disp: 90 tablet, Rfl: 1   ferrous sulfate 325 (65 FE) MG tablet, TAKE 1 TABLET BY MOUTH EVERY DAY WITH BREAKFAST, Disp: 90 tablet, Rfl: 0   fluticasone (FLONASE) 50 MCG/ACT nasal spray, SPRAY 2 SPRAYS INTO EACH NOSTRIL EVERY DAY, Disp:  48 mL, Rfl: 1   lactulose (CHRONULAC) 10 GM/15ML solution, Take 30 mLs (20 g total) by mouth daily as needed for mild constipation., Disp: 120 mL, Rfl: 0   Lancets (ONETOUCH DELICA PLUS [id number]) MISC, 1 each by Does not apply route daily., Disp: 100 each, Rfl: 2   lubiprostone (AMITIZA) 24 MCG capsule, TAKE 1 CAPSULE (24 MCG TOTAL) BY MOUTH 2 (TWO) TIMES DAILY WITH A MEAL., Disp: 180 capsule, Rfl: 1   metFORMIN (GLUCOPHAGE-XR) 750 MG 24 hr tablet, TAKE 2 TABLETS (1,500 MG TOTAL) BY MOUTH DAILY WITH BREAKFAST., Disp: 180 tablet, Rfl: 1   olmesartan (BENICAR) 20 MG tablet, TAKE 1 TABLET BY MOUTH EVERY DAY, Disp: 90 tablet, Rfl: 0   omega-3 acid ethyl esters (LOVAZA) 1 g capsule, TAKE 1 CAPSULE BY MOUTH TWICE A DAY, Disp: 180 capsule, Rfl: 0   ONETOUCH ULTRA test strip, USE AS DIRECTED, Disp: 100 strip, Rfl: 12   oxyCODONE-acetaminophen (PERCOCET) 7.5-325 MG tablet, Take 1 tablet by mouth every 8 (eight) hours as needed for moderate pain or severe pain., Disp: 90 tablet, Rfl: 0   [START ON 10/11/2021] oxyCODONE-acetaminophen (PERCOCET) 7.5-325 MG tablet, Take 1 tablet by mouth every 8 (eight) hours as needed for moderate pain or severe pain., Disp: 90 tablet, Rfl: 0   pravastatin (PRAVACHOL) 40 MG tablet, TAKE 1 TABLET BY MOUTH EVERYDAY AT BEDTIME, Disp: 90 tablet, Rfl: 0   ROCKLATAN 0.02-0.005 % SOLN, Apply 1 drop to eye at bedtime., Disp: , Rfl:    Vitamin D, Ergocalciferol, (DRISDOL) 1.25 MG (50000 UNIT) CAPS capsule, Take 50,000 Units by mouth every 7 (seven) days., Disp: , Rfl:    brimonidine (ALPHAGAN) 0.15 % ophthalmic solution, 1 drop 2 (two) times daily., Disp: , Rfl:   Allergies  Allergen Reactions   Ace Inhibitors Cough    I personally reviewed active problem list, medication list, allergies, family history, social history, health maintenance with the patient/caregiver today.   ROS  Constitutional: Negative for fever , positive for  weight change.  Respiratory: Negative for cough  and shortness of breath.  Cardiovascular: Negative for chest pain or palpitations.  Gastrointestinal: Negative for abdominal pain, no bowel changes.  Musculoskeletal: positive for gait problem and joint swelling.  Skin: Negative for rash.  Neurological: Negative for dizziness or headache.  No other specific complaints in a complete review of systems (except as listed in HPI above).   Objective  Vitals:   09/18/21 1012  BP: 126/80  Pulse: 95  Resp: 16  SpO2: 97%  Weight: 176 lb (79.8 kg)  Height: 5' (1.524 m)    Body mass index is 34.37 kg/m.  Physical Exam  Constitutional: Patient appears well-developed and well-nourished. Obese  No distress.  HEENT: head atraumatic, normocephalic, pupils equal and reactive to light, neck supple Cardiovascular: Normal rate, regular rhythm and normal heart sounds.  No murmur heard. Trace  BLE edema. Pulmonary/Chest: Effort normal and breath sounds normal. No respiratory distress. Abdominal: Soft.  There is no tenderness. Psychiatric: Patient has a normal mood and affect. behavior is normal. Judgment and thought content normal.    PHQ2/9:    09/18/2021   10:14 AM 06/27/2021    3:07 PM 05/09/2021    2:56 PM 03/24/2021    2:09 PM 12/16/2020    2:10 PM  Depression screen PHQ 2/9  Decreased Interest 0 0 0 0 0  Down, Depressed, Hopeless 0 0 0 0 0  PHQ - 2 Score 0 0 0 0 0  Altered sleeping 0   0   Tired, decreased energy 0   0   Change in appetite 0   0   Feeling bad or failure about yourself  0   0   Trouble concentrating 0   0   Moving slowly or fidgety/restless 0   0   Suicidal thoughts 0   0   PHQ-9 Score 0   0     phq 9 is negative   Fall Risk:    09/18/2021   10:14 AM 06/27/2021    3:07 PM 05/09/2021    2:57 PM 03/24/2021    2:19 PM 12/16/2020    2:10 PM  Fall Risk   Falls in the past year? 0 0 '1 1 1  '$ Number falls in past yr: 0  0 0 0  Injury with Fall? 0  0 0 0  Risk for fall due to : Impaired balance/gait;Impaired mobility   History of fall(s);Impaired mobility;Impaired balance/gait Impaired mobility;Impaired balance/gait Impaired balance/gait  Follow up Falls prevention discussed  Falls prevention discussed Falls prevention discussed Falls prevention discussed      Functional Status Survey: Is the patient deaf or have difficulty hearing?: Yes Does the patient have difficulty seeing, even when wearing glasses/contacts?: No Does the patient have difficulty concentrating, remembering, or making decisions?: Yes Does the patient have difficulty walking or climbing stairs?: Yes Does the patient have difficulty dressing or bathing?: Yes Does the patient have difficulty doing errands alone such as visiting a doctor's office or shopping?: Yes    Assessment & Plan  1. Type 2 diabetes mellitus with microalbuminuria, without long-term current use of insulin (HCC)  - POCT HgB A1C  2. Hospital discharge follow-up  - COMPLETE METABOLIC PANEL WITH GFR - CBC with Differential/Platelet  3. Stage 3a chronic kidney disease ([place])   4. Dyslipidemia associated with type 2 diabetes mellitus ([place])   5. Aortic atherosclerosis (HCC)  Continue statin therapy   6. Age-related osteoporosis without current pathological fracture   7. GERD without esophagitis  Stable   8. Hyperparathyroidism ([place])  Under the  care of nephrologist   9. Mild protein-calorie malnutrition ([place])  Discussed high calorie diet    10. Infestation by bed bug  Patient has left, we tried contacting her and left message for son to call us back

## 2021-09-18 ENCOUNTER — Ambulatory Visit (INDEPENDENT_AMBULATORY_CARE_PROVIDER_SITE_OTHER): Payer: Medicare Other | Admitting: Family Medicine

## 2021-09-18 ENCOUNTER — Encounter: Payer: Self-pay | Admitting: Family Medicine

## 2021-09-18 VITALS — BP 126/80 | HR 95 | Resp 16 | Ht 60.0 in | Wt 176.0 lb

## 2021-09-18 DIAGNOSIS — N1831 Chronic kidney disease, stage 3a: Secondary | ICD-10-CM | POA: Diagnosis not present

## 2021-09-18 DIAGNOSIS — M810 Age-related osteoporosis without current pathological fracture: Secondary | ICD-10-CM

## 2021-09-18 DIAGNOSIS — K219 Gastro-esophageal reflux disease without esophagitis: Secondary | ICD-10-CM

## 2021-09-18 DIAGNOSIS — E213 Hyperparathyroidism, unspecified: Secondary | ICD-10-CM

## 2021-09-18 DIAGNOSIS — B888 Other specified infestations: Secondary | ICD-10-CM

## 2021-09-18 DIAGNOSIS — E1129 Type 2 diabetes mellitus with other diabetic kidney complication: Secondary | ICD-10-CM

## 2021-09-18 DIAGNOSIS — I129 Hypertensive chronic kidney disease with stage 1 through stage 4 chronic kidney disease, or unspecified chronic kidney disease: Secondary | ICD-10-CM | POA: Diagnosis not present

## 2021-09-18 DIAGNOSIS — R809 Proteinuria, unspecified: Secondary | ICD-10-CM | POA: Diagnosis not present

## 2021-09-18 DIAGNOSIS — N183 Chronic kidney disease, stage 3 unspecified: Secondary | ICD-10-CM | POA: Diagnosis not present

## 2021-09-18 DIAGNOSIS — E1169 Type 2 diabetes mellitus with other specified complication: Secondary | ICD-10-CM

## 2021-09-18 DIAGNOSIS — I700 Atherosclerosis of aorta: Secondary | ICD-10-CM

## 2021-09-18 DIAGNOSIS — Z09 Encounter for follow-up examination after completed treatment for conditions other than malignant neoplasm: Secondary | ICD-10-CM | POA: Diagnosis not present

## 2021-09-18 DIAGNOSIS — E785 Hyperlipidemia, unspecified: Secondary | ICD-10-CM

## 2021-09-18 DIAGNOSIS — E441 Mild protein-calorie malnutrition: Secondary | ICD-10-CM

## 2021-09-18 LAB — POCT GLYCOSYLATED HEMOGLOBIN (HGB A1C): Hemoglobin A1C: 6.5 % — AB (ref 4.0–5.6)

## 2021-09-18 NOTE — Telephone Encounter (Signed)
Gave VO to [place].

## 2021-09-19 LAB — CBC WITH DIFFERENTIAL/PLATELET
Absolute Monocytes: 561 cells/uL (ref 200–950)
Basophils Absolute: 43 cells/uL (ref 0–200)
Basophils Relative: 0.7 %
Eosinophils Absolute: 122 cells/uL (ref 15–500)
Eosinophils Relative: 2 %
HCT: 34 % — ABNORMAL LOW (ref 35.0–45.0)
Hemoglobin: 11.3 g/dL — ABNORMAL LOW (ref 11.7–15.5)
Lymphs Abs: 1732 cells/uL (ref 850–3900)
MCH: 32.9 pg (ref 27.0–33.0)
MCHC: 33.2 g/dL (ref 32.0–36.0)
MCV: 99.1 fL (ref 80.0–100.0)
MPV: 9.3 fL (ref 7.5–12.5)
Monocytes Relative: 9.2 %
Neutro Abs: 3642 cells/uL (ref 1500–7800)
Neutrophils Relative %: 59.7 %
Platelets: 342 10*3/uL (ref 140–400)
RBC: 3.43 10*6/uL — ABNORMAL LOW (ref 3.80–5.10)
RDW: 13.5 % (ref 11.0–15.0)
Total Lymphocyte: 28.4 %
WBC: 6.1 10*3/uL (ref 3.8–10.8)

## 2021-09-19 LAB — COMPLETE METABOLIC PANEL WITH GFR
AG Ratio: 1.5 (calc) (ref 1.0–2.5)
ALT: 7 U/L (ref 6–29)
AST: 8 U/L — ABNORMAL LOW (ref 10–35)
Albumin: 3.9 g/dL (ref 3.6–5.1)
Alkaline phosphatase (APISO): 37 U/L (ref 37–153)
BUN/Creatinine Ratio: 17 (calc) (ref 6–22)
BUN: 31 mg/dL — ABNORMAL HIGH (ref 7–25)
CO2: 23 mmol/L (ref 20–32)
Calcium: 9.6 mg/dL (ref 8.6–10.4)
Chloride: 107 mmol/L (ref 98–110)
Creat: 1.8 mg/dL — ABNORMAL HIGH (ref 0.60–0.95)
Globulin: 2.6 g/dL (calc) (ref 1.9–3.7)
Glucose, Bld: 197 mg/dL — ABNORMAL HIGH (ref 65–99)
Potassium: 5.1 mmol/L (ref 3.5–5.3)
Sodium: 142 mmol/L (ref 135–146)
Total Bilirubin: 0.4 mg/dL (ref 0.2–1.2)
Total Protein: 6.5 g/dL (ref 6.1–8.1)
eGFR: 27 mL/min/{1.73_m2} — ABNORMAL LOW (ref >=60)

## 2021-09-21 ENCOUNTER — Telehealth: Payer: Self-pay | Admitting: Family Medicine

## 2021-09-21 NOTE — Telephone Encounter (Signed)
Copied from [place]. Topic: General - Other >> Sep 21, 2021 11:29 AM Ludger Nutting wrote: Home Health Verbal Orders: [place] Number: [phone] OT Frequency: 2 weeks 2 starting on 8/21  Sherlynn Stalls would also like to get Social worker orders placed because pt is blind and almost deaf.

## 2021-09-22 ENCOUNTER — Telehealth: Payer: Self-pay | Admitting: Family Medicine

## 2021-09-22 NOTE — Telephone Encounter (Signed)
Copied from [place] [id number]. Topic: General - Other >> Sep 22, 2021 10:54 AM Everette C wrote: Reason for CRM: Sherlynn Stalls from [facility] has returned missed call from [facility] regarding the patient   Please contact Sherlynn Stalls further when possible

## 2021-09-22 NOTE — Telephone Encounter (Signed)
Called Esther back no answer left vm to call back office.

## 2021-09-22 NOTE — Telephone Encounter (Signed)
VO given to [employer]

## 2021-09-22 NOTE — Telephone Encounter (Signed)
VO given to Taylor [name]

## 2021-09-25 ENCOUNTER — Ambulatory Visit: Payer: Self-pay | Admitting: *Deleted

## 2021-09-25 NOTE — Patient Outreach (Signed)
  Care Coordination   09/25/2021 Name: Taylor [name] MRN: [id number] DOB: [birth year]   Care Coordination Outreach Attempts:  An unsuccessful telephone outreach was attempted today to offer the patient information about available care coordination services as a benefit of their health plan.   Follow Up Plan:  Additional outreach attempts will be made to offer the patient care coordination information and services.   Encounter Outcome:  No Answer  Care Coordination Interventions Activated:  No   Care Coordination Interventions:  No, not indicated    [place], [facility] [phone]

## 2021-09-27 ENCOUNTER — Ambulatory Visit: Payer: Self-pay | Admitting: *Deleted

## 2021-09-27 NOTE — Patient Outreach (Signed)
  Care Coordination   09/27/2021 Name: Taylor [name] MRN: [id number] DOB: February 28, 1932   Care Coordination Outreach Attempts:  A second unsuccessful outreach was attempted today to offer the patient with information about available care coordination services as a benefit of their health plan.     Follow Up Plan:  Additional outreach attempts will be made to offer the patient care coordination information and services.   Encounter Outcome:  No Answer  Care Coordination Interventions Activated:  No   Care Coordination Interventions:  No, not indicated    Infiniti Hoefling, [place] Worker  [facility] Care Management [phone]

## 2021-09-28 ENCOUNTER — Telehealth: Payer: Self-pay | Admitting: Family Medicine

## 2021-09-28 ENCOUNTER — Ambulatory Visit: Payer: Self-pay | Admitting: *Deleted

## 2021-09-28 NOTE — Patient Outreach (Signed)
  Care Coordination   Initial Visit Note   09/28/2021 Name: ANISSIA WESSELLS MRN: [id number] DOB: November 01, 1932  SAUMYA HUKILL is a 88 y.o. year old female who sees [name], MD for primary care. I spoke with  [name] by phone today  What matters to the patients health and wellness today?  Patient verbalized having no additional needs at this time    Goals Addressed             This Visit's Progress    Care coordination activities       Care Coordination Interventions:  Needs assessment completed/SDOH screen completed Case management program discussed Discussed concerns regarding bed bugs in her apartment, confirmed that her mattress has been removed and her apartment sprayed PHQ2/PHQ9 completed Active listening / Reflection utilized  Emotional Support Provided Discussed self-care action plan: Patient states that due to limitations with her hearing and eye sight she will be residing with her daughter in [place]. They will rotate residences between her daughters home in [place] and her apartment in [place] also involved and will resume next week upon her return from her daughters home(PT, OT, SW) Patient states that she has a cain, walker and W/C No further needs verbalized at this time        SDOH assessments and interventions completed:  Yes  SDOH Interventions Today    Flowsheet Row Most Recent Value  SDOH Interventions   Food Insecurity Interventions Intervention Not Indicated  Financial Strain Interventions Intervention Not Indicated  Physical Activity Interventions Other (Comments)  [patient referred for [facility] services]  Stress Interventions Intervention Not Indicated        Care Coordination Interventions Activated:  Yes  Care Coordination Interventions:  Yes, provided   Follow up plan: No further intervention required.   Encounter Outcome:  Pt. Visit Completed

## 2021-09-28 NOTE — Patient Instructions (Signed)
Visit Information  Thank you for taking time to visit with me today. Please don't hesitate to contact me if I can be of assistance to you.   Following are the goals we discussed today:   Goals Addressed             This Visit's Progress    Care coordination activities       Care Coordination Interventions:  Needs assessment completed/SDOH screen completed Case management program discussed Discussed concerns regarding bed bugs in her apartment, confirmed that her mattress has been removed and her apartment sprayed PHQ2/PHQ9 completed Active listening / Reflection utilized  Emotional Support Provided Discussed self-care action plan: Patient states that due to limitations with her hearing and eye sight she will be residing with her daughter in [place]. They will rotate residences between her daughters home in [place] and her apartment in [place] also involved and will resume next week upon her return from her daughters home(PT, OT, SW) Patient states that she has a cain, walker and W/C No further needs verbalized at this time          Please call the care guide team at [phone] if you need to cancel or reschedule your appointment.   If you are experiencing a Mental Health or [place] or need someone to talk to, please call the Suicide and Crisis Lifeline: 988   Patient verbalizes understanding of instructions and care plan provided today and agrees to view in [place]. Active MyChart status and patient understanding of how to access instructions and care plan via MyChart confirmed with patient.     No further follow up required: Patient to contact this Education officer, museum with any additional community resource needs  [employer], [place] Worker  [facility] Care Management [phone]

## 2021-09-28 NOTE — Telephone Encounter (Signed)
Copied from [place] [id number]. Topic: General - Inquiry >> Sep 28, 2021 12:57 PM Marcellus Scott wrote: Reason for CRM: Pt stated she was on the line with Chrystal, and the call disconnected. Pt is returning [place], [place] call. When calling Crystal with pt on the line, the call dropped.  Pt is requesting a call back at the number below.     [phone]

## 2021-10-09 NOTE — Progress Notes (Unsigned)
Referring Physician:  Alba Cory, MD [address] [place],  [place] [address]  Primary Physician:  Alba Cory, MD  History of Present Illness: 10/09/2021 Taylor [name] is here today with a chief complaint of *** Meningioma.   She does not have any new concerns? She denies headaches, dizziness or vision changes?  Past Surgery: *** Back surgery in 2004?  Taylor [name] has ***no symptoms of cervical myelopathy.  The symptoms are causing a significant impact on the patient's life.   Review of Systems:  A 10 point review of systems is negative, except for the pertinent positives and negatives detailed in the HPI.  Past Medical History: Past Medical History:  Diagnosis Date   Abnormal mammogram, unspecified 2013   Anemia    Arthritis    Breast screening, unspecified 2013   Diabetes mellitus without complication (HCC) 1998   non insulin dependent   Dysrhythmia    IRREGULAR HEART BEAT   GERD (gastroesophageal reflux disease)    Glaucoma 2003   Gout    History of kidney stones    H/O   Hyperlipidemia 2008   Hypertension 1980's   Lump or mass in breast 01/03/2012   left breast   Obesity, unspecified 2013   Osteoporosis    Shingles 2013   Special screening for malignant neoplasms, colon 2013    Past Surgical History: Past Surgical History:  Procedure Laterality Date   ABDOMINAL HYSTERECTOMY  1958   menorrhagia   BACK SURGERY     BREAST BIOPSY Left 01/25/2012   BREAST BIOPSY Right 04/08/2019   hear clip, [place] bx, path pending   BREAST EXCISIONAL BIOPSY     BREAST LUMPECTOMY WITH NEEDLE LOCALIZATION Right 05/12/2019   Procedure: BREAST LUMPECTOMY WITH NEEDLE LOCALIZATION;  Surgeon: Henrene Dodge, MD;  Location: [facility] ORS;  Service: General;  Laterality: Right;   CATARACT EXTRACTION, BILATERAL Bilateral    1st 06/07/15 and the 2nd 06/21/15   CHOLECYSTECTOMY     COLONOSCOPY  2003   [facility]   COLONOSCOPY WITH PROPOFOL N/A 07/02/2019   Procedure:  COLONOSCOPY WITH PROPOFOL;  Surgeon: Pasty Spillers, MD;  Location: [facility] END[facility]COPY;  Service: Endoscopy;  Laterality: N/A;   COLONOSCOPY WITH PROPOFOL N/A 07/16/2019   Procedure: COLONOSCOPY WITH PROPOFOL;  Surgeon: Pasty Spillers, MD;  Location: [facility] ENDOSCOPY;  Service: Endoscopy;  Laterality: N/A;   ESOPHAGOGASTRODUODENOSCOPY (EGD) WITH PROPOFOL N/A 07/02/2019   Procedure: ESOPHAGOGASTRODUODENOSCOPY (EGD) WITH PROPOFOL;  Surgeon: Pasty Spillers, MD;  Location: [facility] ENDOSCOPY;  Service: Endoscopy;  Laterality: N/A;   EYE SURGERY     KNEE SURGERY  2009,2011,2012   twice on right and once on left   KNEE SURGERY     LIPOMA EXCISION  1998   SPINE SURGERY  2004   TONSILLECTOMY     age of 27    Allergies: Allergies as of 10/10/2021 - Review Complete 09/18/2021  Allergen Reaction Noted   Ace inhibitors Cough 04/03/2017    Medications: No outpatient medications have been marked as taking for the 10/10/21 encounter (Appointment) with Venetia Night, MD.    Social History: Social History   Tobacco Use   Smoking status: Never   Smokeless tobacco: Never  Vaping Use   Vaping Use: Never used  Substance Use Topics   Alcohol use: No    Alcohol/week: 0.0 standard drinks of alcohol   Drug use: No    Family Medical History: Family History  Problem Relation Age of Onset   Cancer  Mother        skin cancer   Stroke Mother    Heart disease Father    Breast cancer Neg Hx     Physical Examination: There were no vitals filed for this visit.  General: Patient is well developed, well nourished, calm, collected, and in no apparent distress. Attention to examination is appropriate.  Neck:   Supple.  ***Full range of motion.  Respiratory: Patient is breathing without any difficulty.   NEUROLOGICAL:     Awake, alert, oriented to person, place, and time.  Speech is clear and fluent. Fund of knowledge is appropriate.   Cranial Nerves: Pupils equal round and reactive  to light.  Facial tone is symmetric.  Facial sensation is symmetric. Shoulder shrug is symmetric. Tongue protrusion is midline.  There is no pronator drift.  ROM of spine: full.    Strength: Side Biceps Triceps Deltoid Interossei Grip Wrist Ext. Wrist Flex.  R 5 5 5 5 5 5 5   L 5 5 5 5 5 5 5    Side Iliopsoas Quads Hamstring PF DF EHL  R 5 5 5 5 5 5   L 5 5 5 5 5 5    Reflexes are ***2+ and symmetric at the biceps, triceps, brachioradialis, patella and achilles.   Hoffman's is absent.  Clonus is not present.  Toes are down-going.  Bilateral upper and lower extremity sensation is intact to light touch.    No evidence of dysmetria noted.  Gait is normal.   ***No difficulty with tandem gait.    Medical Decision Making  Imaging: ***  I have personally reviewed the images and agree with the above interpretation.  Assessment and Plan: Taylor [name] is a pleasant 88 y.o. female with ***   I spent a total of *** minutes in face-to-face and non-face-to-face activities related to this patient's care today.  Thank you for involving me in the care of this patient.      Chester K. Myer Haff MD, [facility] Neurosurgery

## 2021-10-10 ENCOUNTER — Encounter: Payer: Self-pay | Admitting: Neurosurgery

## 2021-10-10 ENCOUNTER — Ambulatory Visit (INDEPENDENT_AMBULATORY_CARE_PROVIDER_SITE_OTHER): Payer: Medicare Other | Admitting: Neurosurgery

## 2021-10-10 VITALS — BP 149/84 | HR 102 | Ht 61.0 in

## 2021-10-10 DIAGNOSIS — D420 Neoplasm of uncertain behavior of cerebral meninges: Secondary | ICD-10-CM

## 2021-10-10 DIAGNOSIS — D329 Benign neoplasm of meninges, unspecified: Secondary | ICD-10-CM

## 2021-10-10 NOTE — Progress Notes (Signed)
Referring Physician:  Deetta Perla, MD [place],  [place] [address]  Primary Physician:  [name], MD  History of Present Illness: 10/10/2021 Taylor [name] is here today with a chief complaint of known meningioma.  She is here for follow-up today.  She is extremely hard of hearing.  She has no new concerns.  She denies headaches, dizziness, or vision changes.  She does have some dragging with her right leg.  She has had 3 knee replacements.  She denies sensory changes.    10/04/20 by Deetta Perla, MD: [name] is here for evaluation after presenting to the emergency department for dizziness and gait difficulty. As part of this work-up, she did undergo an MRI of the brain which did reveal a meningioma. She specifically denies to [place] today that she is having any focal weakness. She does have some difficulty with the right knee after having prior surgeries there. She denies any numbness. She denies any changes in her vision or speech. She does state that she has some dizziness and gait difficulty still but she does use a walker at times. She does not endorse any severe headaches. She does comment on some pain since that she gets when lying on her left side of her head. She does not experience this when sitting up or moving around during the day. Given the MRI finding, she is here for evaluation.  Review of Systems:  A 10 point review of systems is negative, except for the pertinent positives and negatives detailed in the HPI.  Past Medical History: Past Medical History:  Diagnosis Date   Abnormal mammogram, unspecified 2013   Anemia    Arthritis    Breast screening, unspecified 2013   Diabetes mellitus without complication ([place]) 6045   non insulin dependent   Dysrhythmia    IRREGULAR HEART BEAT   GERD (gastroesophageal reflux disease)    Glaucoma 2003   Gout    History of kidney stones    H/O   Hyperlipidemia 2008   Hypertension 1980's   Lump or mass in  breast 01/03/2012   left breast   Obesity, unspecified 2013   Osteoporosis    Shingles 2013   Special screening for malignant neoplasms, colon 2013    Past Surgical History: Past Surgical History:  Procedure Laterality Date   ABDOMINAL HYSTERECTOMY  1958   menorrhagia   BACK SURGERY     BREAST BIOPSY Left 01/25/2012   BREAST BIOPSY Right 04/08/2019   hear clip, [place] bx, path pending   BREAST EXCISIONAL BIOPSY     BREAST LUMPECTOMY WITH NEEDLE LOCALIZATION Right 05/12/2019   Procedure: BREAST LUMPECTOMY WITH NEEDLE LOCALIZATION;  Surgeon: Olean Ree, MD;  Location: [facility] OR[facility];  Service: General;  Laterality: Right;   CATARACT EXTRACTION, BILATERAL Bilateral    1st 06/07/15 and the 2nd 06/21/15   CHOLECYSTECTOMY     COLONOSCOPY  2003   [facility]   COLONOSCOPY WITH PROPOFOL N/A 07/02/2019   Procedure: COLONOSCOPY WITH PROPOFOL;  Surgeon: Virgel Manifold, MD;  Location: [facility] END[facility];  Service: Endoscopy;  Laterality: N/A;   COLONOSCOPY WITH PROPOFOL N/A 07/16/2019   Procedure: COLONOSCOPY WITH PROPOFOL;  Surgeon: Virgel Manifold, MD;  Location: [facility];  Service: Endoscopy;  Laterality: N/A;   ESOPHAGOGASTRODUODENOSCOPY (EGD) WITH PROPOFOL N/A 07/02/2019   Procedure: ESOPHAGOGASTRODUODENOSCOPY (EGD) WITH PROPOFOL;  Surgeon: Virgel Manifold, MD;  Location: [facility];  Service: Endoscopy;  Laterality: N/A;   EYE SURGERY  KNEE SURGERY  2009,2011,2012   twice on right and once on left   [place] SURGERY  2004   TONSILLECTOMY     age of 13    Allergies: Allergies as of 10/10/2021 - Review Complete 09/18/2021  Allergen Reaction Noted   Ace inhibitors Cough 04/03/2017    Medications: No outpatient medications have been marked as taking for the 10/10/21 encounter (Office Visit) with Meade Maw, MD.    Social History: Social History   Tobacco Use   Smoking status: Never   Smokeless tobacco: Never  Vaping Use    Vaping Use: Never used  Substance Use Topics   Alcohol use: No    Alcohol/week: 0.0 standard drinks of alcohol   Drug use: No    Family Medical History: Family History  Problem Relation Age of Onset   Cancer Mother        skin cancer   Stroke Mother    Heart disease Father    Breast cancer Neg Hx     Physical Examination: Vitals:   10/10/21 1303  BP: (!) 149/84  Pulse: (!) 102    General: Patient is well developed, well nourished, calm, collected, and in no apparent distress. Attention to examination is appropriate.  Neck:   Supple.  Full range of motion.  Respiratory: Patient is breathing without any difficulty.   NEUROLOGICAL:     Awake, alert, oriented to person, place, and time.  Speech is clear and fluent. Fund of knowledge is appropriate.   Cranial Nerves: Pupils equal round and reactive to light.  Facial tone is symmetric.  Facial sensation is symmetric. Shoulder shrug is symmetric. Tongue protrusion is midline.  There is no pronator drift.  ROM of spine: full.    Strength: Side Biceps Triceps Deltoid Interossei Grip Wrist Ext. Wrist Flex.  R '5 5 5 5 5 5 5  '$ L '5 5 5 5 5 5 5   '$ Side Iliopsoas Quads Hamstring PF DF EHL  R '5 5 5 5 5 5  '$ L '5 5 5 5 5 5   '$ Reflexes are 1+ and symmetric at the biceps, triceps, brachioradialis, patella and achilles.   Hoffman's is absent.  Clonus is not present.  Toes are down-going.  Bilateral upper and lower extremity sensation is intact to light touch.    No evidence of dysmetria noted.  Gait is slowed.    Medical Decision Making  Imaging: MRI Brain 09/10/21 IMPRESSION: 1. No acute finding. 2. Known nearly 4 cm meningioma along the high left frontal convexity. 3. Aging brain.     Electronically Signed   By: Jorje Guild M.D.   On: 09/10/2021 06:27  I have personally reviewed the images and agree with the above interpretation.  Assessment and Plan: [name] is a pleasant 88 y.o. female with a known left  frontal meningioma.  She is mildly symptomatic from this.  At this point, I would not recommend surgical intervention.  Given her current medical condition, I do not feel that the risks would be worth it.  I do not think that she should have additional imaging follow-up.  If she develops new symptoms, I am happy to reevaluate at any point.   I spent a total of   30 minutes in face-to-face and non-face-to-face activities related to this patient's care today.  Thank you for involving me in the care of this patient.      Tammi Boulier K.  Izora Ribas MD, [facility] Neurosurgery

## 2021-10-13 ENCOUNTER — Other Ambulatory Visit: Payer: Self-pay | Admitting: Family Medicine

## 2021-10-13 DIAGNOSIS — E1129 Type 2 diabetes mellitus with other diabetic kidney complication: Secondary | ICD-10-CM

## 2021-10-13 NOTE — Telephone Encounter (Signed)
Copied from [place] [id number]. Topic: General - Other >> Oct 12, 2021  4:21 PM Tiffany B wrote: Requesting verbal orders for social worker consultation and assistance with community resources

## 2021-10-27 ENCOUNTER — Other Ambulatory Visit: Payer: Self-pay | Admitting: Family Medicine

## 2021-10-27 DIAGNOSIS — D649 Anemia, unspecified: Secondary | ICD-10-CM

## 2021-10-27 DIAGNOSIS — R790 Abnormal level of blood mineral: Secondary | ICD-10-CM

## 2021-11-05 ENCOUNTER — Other Ambulatory Visit: Payer: Self-pay | Admitting: Family Medicine

## 2021-11-05 DIAGNOSIS — K5909 Other constipation: Secondary | ICD-10-CM

## 2021-11-08 ENCOUNTER — Telehealth: Payer: Self-pay | Admitting: Family Medicine

## 2021-11-08 NOTE — Telephone Encounter (Signed)
Chris with [facility] is calling in for verbal orders for Social Work eval for tomorrow 11/09/21   CB: [phone] [phone]- secure voicemail

## 2021-11-13 ENCOUNTER — Other Ambulatory Visit: Payer: Self-pay | Admitting: Family Medicine

## 2021-11-13 DIAGNOSIS — E1129 Type 2 diabetes mellitus with other diabetic kidney complication: Secondary | ICD-10-CM

## 2021-11-14 ENCOUNTER — Telehealth: Payer: Self-pay | Admitting: Family Medicine

## 2021-11-14 NOTE — Telephone Encounter (Signed)
Not a patient at [facility].

## 2021-11-14 NOTE — Telephone Encounter (Signed)
Sandi called [place] Worker  Provided Pt and her daughter with ALF resource in the community  Best contact: [phone] option 2

## 2021-11-17 ENCOUNTER — Other Ambulatory Visit: Payer: Self-pay | Admitting: Family Medicine

## 2021-11-17 DIAGNOSIS — E7800 Pure hypercholesterolemia, unspecified: Secondary | ICD-10-CM

## 2021-11-21 ENCOUNTER — Ambulatory Visit: Payer: Self-pay | Admitting: *Deleted

## 2021-11-21 NOTE — Telephone Encounter (Signed)
Pt has appt with Dr Ancil Boozer tomorrow. HH PT called to report a fall over the weekend and EMS assessed the pt and did not find any injuries. PT states it was a mechanical fall.  Answer Assessment - Initial Assessment Questions 1. REASON FOR CALL or QUESTION: "What is your reason for calling today?" or "How can I best help you?" or "What question do you have that I can help answer?"     HH PT called to report that the family said pt fell Sunday, EMS came to the house, no injury found. States it sounds like a mechanical fall, not a syncopal episode.  Protocols used: Information Only Call - No Triage-A-AH

## 2021-11-21 NOTE — Progress Notes (Unsigned)
Name: Taylor [name]   MRN: [id number]    DOB: Aug 10, 1932   Date:11/22/2021       Progress Note  Subjective  Chief Complaint  Follow Up  HPI  She came in today to fill out forms so she can move to [facility] - personal care home. She is needing more assistance with bathing, dressing, transferring, preparing meals. She has decrease in vision, hearing loss, and OA that causes ambulatory problems with recurrent falls.. Over the past few months she has also noticed right hand pain but no swelling - denies trauma. She felt dizzy and had a fall, went to [facility] and CT brain showed a meningioma. She was seen by Dr. Lacinda Axon and not advised to have surgery .   She fell this past weekend on her buttocks and has been having pain since. She did not go to [facility]. EMS went to evaluate her . She has been able to walk, denies back pain, buttocks are sore    Patient Active Problem List   Diagnosis Date Noted   Mild protein-calorie malnutrition ([place]) 09/18/2021   GERD without esophagitis 09/18/2021   Obesity (BMI 30-39.9) 09/11/2021   Dizziness 09/10/2021   Closed compression fracture of body of L1 vertebra ([place]) 09/23/2020   Gastric polyp    Polyp of colon    Morbid obesity ([place]) 04/08/2018   Age-related osteoporosis without current pathological fracture 09/26/2017   Hyperparathyroidism ([place]) 08/06/2017   Stage 3a chronic kidney disease ([place]) 08/06/2017   HLD (hyperlipidemia) 07/25/2017   Osteoporotic compression fracture of spine ([place]) 03/22/2017   Assistance needed with transportation 03/21/2017   Iron deficiency anemia 12/19/2016   Aortic atherosclerosis ([place]) 11/23/2016   Anterolisthesis 11/23/2016   Vitamin D deficiency 08/19/2016   Anemia, unspecified 08/19/2016   Primary open angle glaucoma (POAG) of both eyes, severe stage 08/17/2016   Hypertensive retinopathy 08/17/2016   Long term current use of opiate analgesic 07/19/2016   Dyslipidemia associated with type 2 diabetes mellitus ([place])  04/16/2016   Chronic midline low back pain without sciatica 04/16/2016   Chronic pain of right knee 04/16/2016   Vaginal dryness 02/22/2015   Calculus of kidney 11/10/2014   Osteoarthritis, multiple sites 09/09/2014   Type 2 diabetes mellitus with microalbuminuria ([place]) 09/09/2014   Glaucoma 09/09/2014   Benign hypertension with CKD (chronic kidney disease) stage III ([place]) 09/09/2014   Chronic pain 09/09/2014   Tinea corporis 34/19/6222   Umbilical hernia 97/98/9211   Mass of right breast    Diabetes mellitus without complication ([place]) 9417    Past Surgical History:  Procedure Laterality Date   ABDOMINAL HYSTERECTOMY  1958   menorrhagia   BACK SURGERY     BREAST BIOPSY Left 01/25/2012   BREAST BIOPSY Right 04/08/2019   hear clip, [place] bx, path pending   BREAST EXCISIONAL BIOPSY     BREAST LUMPECTOMY WITH NEEDLE LOCALIZATION Right 05/12/2019   Procedure: BREAST LUMPECTOMY WITH NEEDLE LOCALIZATION;  Surgeon: Olean Ree, MD;  Location: [facility] ORS;  Service: General;  Laterality: Right;   CATARACT EXTRACTION, BILATERAL Bilateral    1st 06/07/15 and the 2nd 06/21/15   CHOLECYSTECTOMY     COLONOSCOPY  2003   [facility]   COLONOSCOPY WITH PROPOFOL N/A 07/02/2019   Procedure: COLONOSCOPY WITH PROPOFOL;  Surgeon: Virgel Manifold, MD;  Location: [facility] ENDOSCOPY;  Service: Endoscopy;  Laterality: N/A;   COLONOSCOPY WITH PROPOFOL N/A 07/16/2019   Procedure: COLONOSCOPY WITH PROPOFOL;  Surgeon: Virgel Manifold, MD;  Location: [facility]  ENDOSCOPY;  Service: Endoscopy;  Laterality: N/A;   ESOPHAGOGASTRODUODENOSCOPY (EGD) WITH PROPOFOL N/A 07/02/2019   Procedure: ESOPHAGOGASTRODUODENOSCOPY (EGD) WITH PROPOFOL;  Surgeon: Virgel Manifold, MD;  Location: [facility] ENDOSCOPY;  Service: Endoscopy;  Laterality: N/A;   EYE SURGERY     KNEE SURGERY  2009,2011,2012   twice on right and once on left   [place] SURGERY  2004   TONSILLECTOMY     age of 59    Family  History  Problem Relation Age of Onset   Cancer Mother        skin cancer   Stroke Mother    Heart disease Father    Breast cancer Neg Hx     Social History   Tobacco Use   Smoking status: Never   Smokeless tobacco: Never  Substance Use Topics   Alcohol use: No    Alcohol/week: 0.0 standard drinks of alcohol     Current Outpatient Medications:    acetaminophen (TYLENOL) 500 MG tablet, Take 1 tablet (500 mg total) by mouth 3 (three) times daily. (Patient taking differently: Take 500 mg by mouth every 6 (six) hours as needed for moderate pain or headache.), Disp: 90 tablet, Rfl: 0   amLODipine (NORVASC) 10 MG tablet, Take 1 tablet (10 mg total) by mouth daily., Disp: 90 tablet, Rfl: 0   aspirin 81 MG chewable tablet, Chew 1 tablet (81 mg total) by mouth daily., Disp: 30 tablet, Rfl: 3   brimonidine (ALPHAGAN) 0.15 % ophthalmic solution, 1 drop 2 (two) times daily., Disp: , Rfl:    brimonidine (ALPHAGAN) 0.2 % ophthalmic solution, Place 1 drop into both eyes 2 (two) times daily., Disp: , Rfl:    brinzolamide (AZOPT) 1 % ophthalmic suspension, SMARTSIG:In Eye(s), Disp: , Rfl:    Calcium Carbonate-Vitamin D (OYSTER SHELL CALCIUM 500 + D PO), Take 1 tablet by mouth 2 (two) times a day., Disp: , Rfl:    denosumab (PROLIA) 60 MG/ML SOSY injection, Inject into the skin., Disp: , Rfl:    Docusate Sodium 100 MG capsule, Take 100 mg by mouth 2 (two) times daily., Disp: , Rfl:    dorzolamide (TRUSOPT) 2 % ophthalmic solution, 1 drop 2 (two) times daily., Disp: , Rfl:    famotidine (PEPCID) 20 MG tablet, TAKE 1 TABLET BY MOUTH EVERY DAY, Disp: 90 tablet, Rfl: 1   ferrous sulfate 325 (65 FE) MG tablet, TAKE 1 TABLET BY MOUTH EVERY DAY WITH BREAKFAST, Disp: 90 tablet, Rfl: 0   fluticasone (FLONASE) 50 MCG/ACT nasal spray, SPRAY 2 SPRAYS INTO EACH NOSTRIL EVERY DAY, Disp: 48 mL, Rfl: 1   lactulose (CHRONULAC) 10 GM/15ML solution, TAKE 30 MLS (20 G TOTAL) BY MOUTH DAILY AS NEEDED FOR MILD  CONSTIPATION., Disp: 120 mL, Rfl: 0   Lancets (ONETOUCH DELICA PLUS [id number]) MISC, 1 each by Does not apply route daily., Disp: 100 each, Rfl: 2   lubiprostone (AMITIZA) 24 MCG capsule, TAKE 1 CAPSULE (24 MCG TOTAL) BY MOUTH 2 (TWO) TIMES DAILY WITH A MEAL., Disp: 180 capsule, Rfl: 1   metFORMIN (GLUCOPHAGE-XR) 750 MG 24 hr tablet, TAKE 2 TABLETS (1,500 MG TOTAL) BY MOUTH EVERY DAY WITH BREAKFAST, Disp: 180 tablet, Rfl: 0   olmesartan (BENICAR) 20 MG tablet, TAKE 1 TABLET BY MOUTH EVERY DAY, Disp: 90 tablet, Rfl: 0   omega-3 acid ethyl esters (LOVAZA) 1 g capsule, TAKE 1 CAPSULE BY MOUTH TWICE A DAY, Disp: 180 capsule, Rfl: 0  ONETOUCH ULTRA test strip, USE AS DIRECTED, Disp: 100 strip, Rfl: 12   pravastatin (PRAVACHOL) 40 MG tablet, TAKE 1 TABLET BY MOUTH EVERYDAY AT BEDTIME, Disp: 90 tablet, Rfl: 0   ROCKLATAN 0.02-0.005 % SOLN, Apply 1 drop to eye at bedtime., Disp: , Rfl:    Vitamin D, Ergocalciferol, (DRISDOL) 1.25 MG (50000 UNIT) CAPS capsule, Take 50,000 Units by mouth every 7 (seven) days., Disp: , Rfl:   Allergies  Allergen Reactions   Ace Inhibitors Cough    I personally reviewed active problem list, medication list, allergies, family history, social history, health maintenance with the patient/caregiver today.   ROS  Ten systems reviewed and is negative except as mentioned in HPI   Objective  Vitals:   11/22/21 1438  BP: 132/76  Pulse: (!) 102  Resp: 16  SpO2: 94%  Height: 5' (1.524 m)    Body mass index is 34.37 kg/m.  Physical Exam  Constitutional: Patient appears well-developed and well-nourished. Obese  No distress.  HEENT: head atraumatic, normocephalic, pupils equal and reactive to light, neck supple Cardiovascular: Normal rate, regular rhythm and normal heart sounds.  No murmur heard. No BLE edema. Pulmonary/Chest: Effort normal and breath sounds normal. No respiratory distress. Abdominal: Soft.  There is no tenderness. Psychiatric: Patient has a  normal mood and affect. behavior is normal. Judgment and thought content normal.  Muscular skeletal: pain during palpation of right mid hand, no swelling or increase in warmth    PHQ2/9:    11/22/2021    2:38 PM 09/28/2021    1:27 PM 09/18/2021   10:14 AM 06/27/2021    3:07 PM 05/09/2021    2:56 PM  Depression screen PHQ 2/9  Decreased Interest 0 0 0 0 0  Down, Depressed, Hopeless 0 0 0 0 0  PHQ - 2 Score 0 0 0 0 0  Altered sleeping   0    Tired, decreased energy   0    Change in appetite   0    Feeling bad or failure about yourself    0    Trouble concentrating   0    Moving slowly or fidgety/restless   0    Suicidal thoughts   0    PHQ-9 Score   0      phq 9 is negative   Fall Risk:    11/22/2021    2:37 PM 09/18/2021   10:14 AM 06/27/2021    3:07 PM 05/09/2021    2:57 PM 03/24/2021    2:19 PM  Fall Risk   Falls in the past year? 1 0 0 1 1  Number falls in past yr: 1 0  0 0  Injury with Fall? 1 0  0 0  Risk for fall due to : Impaired balance/gait;Impaired mobility Impaired balance/gait;Impaired mobility  History of fall(s);Impaired mobility;Impaired balance/gait Impaired mobility;Impaired balance/gait  Follow up Falls prevention discussed Falls prevention discussed  Falls prevention discussed Falls prevention discussed      Functional Status Survey: Is the patient deaf or have difficulty hearing?: Yes Does the patient have difficulty seeing, even when wearing glasses/contacts?: Yes Does the patient have difficulty concentrating, remembering, or making decisions?: Yes Does the patient have difficulty walking or climbing stairs?: Yes Does the patient have difficulty dressing or bathing?: Yes Does the patient have difficulty doing errands alone such as visiting a doctor's office or shopping?: Yes    Assessment & Plan  1. Meningioma [facility])  Seen by Dr. Cari Caraway and given reassurance -  he did not recommend repeat imaging or surgery   2. Frail elderly  Going to a  facility   3. Needs flu shot  - Flu Vaccine QUAD High Dose(Fluad)  4. Need for pneumococcal 20-valent conjugate vaccination  - Pneumococcal conjugate vaccine 20-valent (Prevnar 20)  5. Right hand pain  - diclofenac Sodium (VOLTAREN) 1 % GEL; Apply 2 g topically 4 (four) times daily.  Dispense: 150 g; Refill: 0 - DG Hand Complete Right; Future  6. Frequent falls  - DG Hand Complete Right; Future  7. Primary osteoarthritis involving multiple joints  - diclofenac Sodium (VOLTAREN) 1 % GEL; Apply 2 g topically 4 (four) times daily.  Dispense: 150 g; Refill: 0

## 2021-11-22 ENCOUNTER — Ambulatory Visit
Admission: RE | Admit: 2021-11-22 | Discharge: 2021-11-22 | Disposition: A | Discharge status: Home / Self Care | Payer: Medicare Other | Source: Ambulatory Visit | Attending: Family Medicine | Admitting: Family Medicine

## 2021-11-22 ENCOUNTER — Ambulatory Visit (INDEPENDENT_AMBULATORY_CARE_PROVIDER_SITE_OTHER): Payer: Medicare Other | Admitting: Family Medicine

## 2021-11-22 ENCOUNTER — Encounter: Payer: Self-pay | Admitting: Family Medicine

## 2021-11-22 ENCOUNTER — Ambulatory Visit
Admission: RE | Admit: 2021-11-22 | Discharge: 2021-11-22 | Disposition: A | Discharge status: Home / Self Care | Payer: Medicare Other | Attending: Family Medicine | Admitting: Family Medicine

## 2021-11-22 VITALS — BP 132/76 | HR 102 | Resp 16 | Ht 60.0 in

## 2021-11-22 DIAGNOSIS — M79641 Pain in right hand: Secondary | ICD-10-CM | POA: Diagnosis present

## 2021-11-22 DIAGNOSIS — D329 Benign neoplasm of meninges, unspecified: Secondary | ICD-10-CM

## 2021-11-22 DIAGNOSIS — Z23 Encounter for immunization: Secondary | ICD-10-CM

## 2021-11-22 DIAGNOSIS — M159 Polyosteoarthritis, unspecified: Secondary | ICD-10-CM

## 2021-11-22 DIAGNOSIS — R54 Age-related physical debility: Secondary | ICD-10-CM | POA: Diagnosis not present

## 2021-11-22 DIAGNOSIS — R296 Repeated falls: Secondary | ICD-10-CM

## 2021-11-22 DIAGNOSIS — Z1231 Encounter for screening mammogram for malignant neoplasm of breast: Secondary | ICD-10-CM

## 2021-11-22 DIAGNOSIS — M19041 Primary osteoarthritis, right hand: Secondary | ICD-10-CM | POA: Insufficient documentation

## 2021-11-22 DIAGNOSIS — M150 Primary generalized (osteo)arthritis: Secondary | ICD-10-CM

## 2021-11-22 MED ORDER — DICLOFENAC SODIUM 1 % EX GEL: 2.0000 g | Freq: Four times a day (QID) | CUTANEOUS | 0 refills | Status: DC

## 2021-11-23 ENCOUNTER — Ambulatory Visit: Payer: Self-pay

## 2021-11-23 ENCOUNTER — Other Ambulatory Visit: Payer: Self-pay

## 2021-11-23 DIAGNOSIS — Z111 Encounter for screening for respiratory tuberculosis: Secondary | ICD-10-CM

## 2021-11-23 NOTE — Telephone Encounter (Signed)
  Chief Complaint: Missing information for pharmacy Symptoms:  Frequency:  Pertinent Negatives: Patient denies  Disposition: '[]'$ ED /'[]'$ Urgent Care (no appt availability in office) / '[]'$ Appointment(In office/virtual)/ '[]'$  [facility]/ '[]'$ Home Care/ '[]'$ Refused Recommended Disposition /'[]'$  Mobile Bus/  XFollow-up with PCP Additional Notes: Returned call to pharmacy and spoke with [employer].   Amber states that a few of the Rx's sent are missing needed information for refill.  Azopt eye drops rx has no directions for use.  Cal Vit D Has no strength listed.  Prolia has not directions for administration  Test strips and lancets directions need to be more specific. Regarding number of times to be used.  Please advise Amber [phone]       Reason for Disposition  [1] Pharmacy calling with prescription question AND [2] triager unable to answer question  Answer Assessment - Initial Assessment Questions 1. NAME of MEDICINE: "What medicine(s) are you calling about?"     Several medications are missing needed information for pharmacy 2. QUESTION: "What is your question?" (e.g., double dose of medicine, side effect)      3. PRESCRIBER: "Who prescribed the medicine?" Reason: if prescribed by specialist, call should be referred to that group.      4. SYMPTOMS: "Do you have any symptoms?" If Yes, ask: "What symptoms are you having?"  "How bad are the symptoms (e.g., mild, moderate, severe)      5. PREGNANCY:  "Is there any chance that you are pregnant?" "When was your last menstrual period?"  Protocols used: Medication Question Call-A-AH

## 2021-11-23 NOTE — Telephone Encounter (Signed)
Summary: discuss medication   Pharmacy states they have "quite a few questions regarding rx that was sent over"   Please assist further

## 2021-11-24 ENCOUNTER — Telehealth: Payer: Self-pay | Admitting: Family Medicine

## 2021-11-24 NOTE — Telephone Encounter (Signed)
Copied from [place] [phone]. Topic: General - Other >> Nov 24, 2021  1:50 PM Ludger Nutting wrote: Taylor [name] with [facility] called to speak with [facility].

## 2021-11-24 NOTE — Telephone Encounter (Signed)
Called to clarify. Taylor [name] out of office and didn't leave any notes/messages for her coworkers. Coworkers cannot find anything about missing information. Made her coworker aware all 4 medications are not prescribed by [place]. She gave verbal understanding and said she'd reach out to [employer].

## 2021-11-24 NOTE — Telephone Encounter (Signed)
Spoke with Jeani Hawking and clarified the medications. She will contact Dr. Honor Junes for the Prolia and Strips/Lancets. In January she saw Dr. Ander Slade w/[facility] so I gave [facility] Hawking their number/fax to reach out for the opthalmic drops incase they were the prescribers.

## 2021-11-27 ENCOUNTER — Telehealth: Payer: Self-pay

## 2021-11-27 NOTE — Progress Notes (Signed)
Chronic Care Management Pharmacy Assistant   Name: Taylor [name]  MRN: [id number] DOB: 07-Dec-1932  Reason for Encounter: General Call   Recent office visits:  11/22/2021 [name], MD (PCP Office Visit) for Follow-up-  Started: Diclofenac Sodium 2 g 4 times daily, DG Hand complete Right order placed  09/18/2021 [name], MD (PCP Office Visit) for Follow-up- No medication changes noted, Lab orders placed, No follow-up noted  Recent consult visits:  10/10/2021 Meade Maw, MD (Neurosurgery) for Initial visit- No medication changes noted, No orders placed, BP 149/84, No follow-up noted  Hospital visits:  Medication Reconciliation was completed by comparing discharge summary, patient's EMR and Pharmacy list, and upon discussion with patient.  Admitted to the hospital on 09/09/2021 due to Chronic [place]. Discharge date was 09/12/2021. Discharged from [facility]?Medications Started at [facility] Discharge:?? -START taking: Aspirin 81 MG chewable tablet Chew 1 tablet (81 mg total) by mouth daily. cephALEXin (KEFLEX) 500 MG capsule Take 1 capsule (500 mg total) by mouth 2 (two) times daily for 3 days.  Medication Changes at Hospital Discharge: -CHANGE how you take: acetaminophen (TYLENOL)  amLODipine (NORVASC) 10 MG tablet Take 1 tablet (10 mg total) by mouth daily. What changed: medication strength how much to take  Medications Discontinued at Hospital Discharge: -STOP taking: brimonidine-timolol 0.2-0.5 % ophthalmic solution (COMBIGAN) brinzolamide 1 % ophthalmic suspension (AZOPT)  Medications that remain the same after Hospital Discharge:??  -All other medications will remain the same.    Medications: Outpatient Encounter Medications as of 11/27/2021  Medication Sig   acetaminophen (TYLENOL) 500 MG tablet Take 1 tablet (500 mg total) by mouth 3 (three) times daily. (Patient taking differently: Take 500 mg by mouth every  6 (six) hours as needed for moderate pain or headache.)   amLODipine (NORVASC) 10 MG tablet Take 1 tablet (10 mg total) by mouth daily.   aspirin 81 MG chewable tablet Chew 1 tablet (81 mg total) by mouth daily.   brimonidine (ALPHAGAN) 0.15 % ophthalmic solution 1 drop 2 (two) times daily.   brimonidine (ALPHAGAN) 0.2 % ophthalmic solution Place 1 drop into both eyes 2 (two) times daily.   brinzolamide (AZOPT) 1 % ophthalmic suspension SMARTSIG:In Eye(s)   Calcium Carbonate-Vitamin D (OYSTER SHELL CALCIUM 500 + D PO) Take 1 tablet by mouth 2 (two) times a day.   denosumab (PROLIA) 60 MG/ML SOSY injection Inject into the skin.   diclofenac Sodium (VOLTAREN) 1 % GEL Apply 2 g topically 4 (four) times daily.   Docusate Sodium 100 MG capsule Take 100 mg by mouth 2 (two) times daily.   dorzolamide (TRUSOPT) 2 % ophthalmic solution 1 drop 2 (two) times daily.   famotidine (PEPCID) 20 MG tablet TAKE 1 TABLET BY MOUTH EVERY DAY   ferrous sulfate 325 (65 FE) MG tablet TAKE 1 TABLET BY MOUTH EVERY DAY WITH BREAKFAST   fluticasone (FLONASE) 50 MCG/ACT nasal spray SPRAY 2 SPRAYS INTO EACH NOSTRIL EVERY DAY   lactulose (CHRONULAC) 10 GM/15ML solution TAKE 30 MLS (20 G TOTAL) BY MOUTH DAILY AS NEEDED FOR MILD CONSTIPATION.   Lancets (ONETOUCH DELICA PLUS [id number]) MISC 1 each by Does not apply route daily.   lubiprostone (AMITIZA) 24 MCG capsule TAKE 1 CAPSULE (24 MCG TOTAL) BY MOUTH 2 (TWO) TIMES DAILY WITH A MEAL.   metFORMIN (GLUCOPHAGE-XR) 750 MG 24 hr tablet TAKE 2 TABLETS (1,500 MG TOTAL) BY MOUTH EVERY DAY WITH BREAKFAST   olmesartan (BENICAR) 20 MG tablet  TAKE 1 TABLET BY MOUTH EVERY DAY   omega-3 acid ethyl esters (LOVAZA) 1 g capsule TAKE 1 CAPSULE BY MOUTH TWICE A DAY   ONETOUCH ULTRA test strip USE AS DIRECTED   pravastatin (PRAVACHOL) 40 MG tablet TAKE 1 TABLET BY MOUTH EVERYDAY AT BEDTIME   ROCKLATAN 0.02-0.005 % SOLN Apply 1 drop to eye at bedtime.   Vitamin D, Ergocalciferol, (DRISDOL)  1.25 MG (50000 UNIT) CAPS capsule Take 50,000 Units by mouth every 7 (seven) days.   No facility-administered encounter medications on file as of 11/27/2021.   Care Gaps: Mammogram  Star Rating Drugs: Metformin 750 mg last filled on 10/13/2021 for a 90-Day supply with [facility] Olmesartan 20 mg last filled on 06/25/2021 for a 90-Day supply with [facility] Pravastatin 40 mg last filled on 11/17/2021 for a 90-Day supply with [facility]  I was unable to  speak with the patient, but I did speak with her daughter. Her daughter advised patient has moved to a facility and she does have her cell phone but hardly ever takes any calls or answers her phone. The daughter advised the patient was doing well.  The daughter provided me with a number of [phone] in which there was no answer and I wasn't able to leave a message. The daughter advised they normally just speak with the facility or go through the facility when it comes to the patient.   Lynann Bologna, CPA/CMA Clinical Pharmacist Assistant Phone: [phone]

## 2021-11-27 NOTE — Telephone Encounter (Signed)
Angie with [facility] called asking about the Metformin and how many times a day the patient should take it.  She would like to have the medicine list faxed to [phone]  Memory Dance  CB@  [phone]

## 2021-11-27 NOTE — Telephone Encounter (Signed)
Printed medication list and marked which medications Dr. Ancil Boozer prescribes and there directions.

## 2021-11-29 NOTE — Telephone Encounter (Signed)
Angie from [facility] stated the patient is a new resident; she has a medication list with an FL2 form and is requesting an updated form.  Angie stated she needs clarification on medication directions for metformin. Angie is requesting an order for how often pt needs blood sugar checks.      Callback: [phone] Fax- [phone]

## 2021-11-29 NOTE — Telephone Encounter (Signed)
I have printed the medication list that has the directions on it for Dr. Ancil Boozer prescriptions twice. I marked that her endocrinologist- Dr. Honor Junes handles the medications/directions for the blood sugar checks and they need to contact him for those details. I  will re-fax for a third time.

## 2021-12-05 ENCOUNTER — Telehealth: Payer: Self-pay | Admitting: Family Medicine

## 2021-12-05 NOTE — Telephone Encounter (Signed)
Copied from [place] [phone]. Topic: Appointment Scheduling - Scheduling Inquiry for Clinic >> Dec 05, 2021  8:39 AM Taylor [name] wrote: Reason for CRM: Joy from [facility] assisted living called in needs TB skin test for patient. Please call kback

## 2021-12-05 NOTE — Telephone Encounter (Signed)
Called Joy back and let them know she hasn't came and got the Quantiferon done, and that we don't do TB skin tests. She stated she will call them and let them know to stop by and have it done since they are at [facility] already.

## 2021-12-06 ENCOUNTER — Emergency Department
Admission: EM | Admit: 2021-12-06 | Discharge: 2021-12-06 | Disposition: A | Discharge status: Home / Self Care | Payer: Medicare Other | Attending: Emergency Medicine | Admitting: Emergency Medicine

## 2021-12-06 ENCOUNTER — Encounter: Payer: Self-pay | Admitting: Emergency Medicine

## 2021-12-06 ENCOUNTER — Emergency Department: Payer: Medicare Other

## 2021-12-06 ENCOUNTER — Other Ambulatory Visit: Payer: Self-pay

## 2021-12-06 DIAGNOSIS — K5900 Constipation, unspecified: Secondary | ICD-10-CM | POA: Diagnosis not present

## 2021-12-06 DIAGNOSIS — N390 Urinary tract infection, site not specified: Secondary | ICD-10-CM | POA: Diagnosis not present

## 2021-12-06 DIAGNOSIS — R1030 Lower abdominal pain, unspecified: Secondary | ICD-10-CM | POA: Diagnosis present

## 2021-12-06 LAB — CBC
HCT: 33.2 % — ABNORMAL LOW (ref 36.0–46.0)
Hemoglobin: 10.8 g/dL — ABNORMAL LOW (ref 12.0–15.0)
MCH: 30.1 pg (ref 26.0–34.0)
MCHC: 32.5 g/dL (ref 30.0–36.0)
MCV: 92.5 fL (ref 80.0–100.0)
Platelets: 396 10*3/uL (ref 150–400)
RBC: 3.59 MIL/uL — ABNORMAL LOW (ref 3.87–5.11)
RDW: 13.2 % (ref 11.5–15.5)
WBC: 8.2 10*3/uL (ref 4.0–10.5)
nRBC: 0 % (ref 0.0–0.2)

## 2021-12-06 LAB — URINALYSIS, ROUTINE W REFLEX MICROSCOPIC
Bacteria, UA: NONE SEEN
Bilirubin Urine: NEGATIVE
Glucose, UA: NEGATIVE mg/dL
Ketones, ur: NEGATIVE mg/dL
Nitrite: NEGATIVE
Protein, ur: NEGATIVE mg/dL
Specific Gravity, Urine: 1.021 (ref 1.005–1.030)
WBC, UA: >50 WBC/hpf — ABNORMAL HIGH (ref 0–5)
pH: 6 (ref 5.0–8.0)

## 2021-12-06 LAB — COMPREHENSIVE METABOLIC PANEL
ALT: 8 U/L (ref 0–44)
AST: 13 U/L — ABNORMAL LOW (ref 15–41)
Albumin: 3.4 g/dL — ABNORMAL LOW (ref 3.5–5.0)
Alkaline Phosphatase: 49 U/L (ref 38–126)
Anion gap: 9 (ref 5–15)
BUN: 24 mg/dL — ABNORMAL HIGH (ref 8–23)
CO2: 19 mmol/L — ABNORMAL LOW (ref 22–32)
Calcium: 9 mg/dL (ref 8.9–10.3)
Chloride: 112 mmol/L — ABNORMAL HIGH (ref 98–111)
Creatinine, Ser: 1.31 mg/dL — ABNORMAL HIGH (ref 0.44–1.00)
GFR, Estimated: 40 mL/min — ABNORMAL LOW (ref >60)
Glucose, Bld: 100 mg/dL — ABNORMAL HIGH (ref 70–99)
Potassium: 3.9 mmol/L (ref 3.5–5.1)
Sodium: 140 mmol/L (ref 135–145)
Total Bilirubin: 0.3 mg/dL (ref 0.3–1.2)
Total Protein: 6.8 g/dL (ref 6.5–8.1)

## 2021-12-06 LAB — LIPASE, BLOOD: Lipase: 25 U/L (ref 11–51)

## 2021-12-06 MED ORDER — IOHEXOL 300 MG/ML  SOLN
75.0000 mL | Freq: Once | INTRAMUSCULAR | Status: AC | PRN
  Administered 2021-12-06: 100 mL via INTRAVENOUS

## 2021-12-06 MED ORDER — CEFDINIR 300 MG PO CAPS: 300.0000 mg | ORAL_CAPSULE | Freq: Two times a day (BID) | ORAL | 0 refills | Status: AC

## 2021-12-06 MED ORDER — POLYETHYLENE GLYCOL 3350 17 G PO PACK: 17.0000 g | PACK | Freq: Every day | ORAL | 0 refills | Status: AC | PRN

## 2021-12-06 MED ORDER — ACETAMINOPHEN 500 MG PO TABS
1000.0000 mg | ORAL_TABLET | Freq: Once | ORAL | Status: AC
  Administered 2021-12-06: 1000 mg via ORAL
  Filled 2021-12-06: qty 2

## 2021-12-06 MED ORDER — CEFDINIR 300 MG PO CAPS
300.0000 mg | ORAL_CAPSULE | Freq: Once | ORAL | Status: AC
  Administered 2021-12-06: 300 mg via ORAL
  Filled 2021-12-06: qty 1

## 2021-12-06 NOTE — ED Notes (Signed)
Pt attempted to urinate on toilet and was unable to urinate at this time. Pt now resting in bed.

## 2021-12-06 NOTE — Discharge Instructions (Addendum)
Patient needs to be on a stool regimen due to the concern for constipation which I suspect is causing her her abdominal pain.  The safest thing to use his MiraLAX he can take a capful of this wait 2-3 hours and if no bowel movement take another capful.  You can continue to do this until she has a successful bowel movement and then she may need to be maintained on stool softeners daily to prevent it from getting constipated again.  We have also started her on some antibiotics to help with a urinary tract infection.  IMPRESSION: 1. Large amount of retained colonic stool, consistent with constipation. No evidence of bowel obstruction. 2. Bilateral renal cortical atrophy. No hydronephrosis or nephrolithiasis. 3. Thickening of the urinary bladder wall, which may be due to cystitis. Correlate with urinalysis. 4. Aortic atherosclerosis. 5. Posterior spinal fusion at L5-S2. Multilevel degenerate disc disease of the lumbar spine with associated facet joint arthropathy.

## 2021-12-06 NOTE — ED Provider Notes (Signed)
[facility] Provider Note    Event Date/Time   First MD Initiated Contact with Patient 12/06/21 1052     (approximate)   History   Abdominal Pain   HPI  KAYTLYNN KOCHAN is a 88 y.o. female who comes in from family care home who reports having lower abdominal pain.  Patient reports lower abdominal pain for the past 6 days is worse at nighttime.  Currently has the pain at this time.  She denies any urinary symptoms and does report that her stools are maybe a little bit hard.  She denies any falls or hitting her head.  Denies any chest pain or shortness of breath.  She reports the pain is in her lower abdomen.  Physical Exam   Triage Vital Signs: ED Triage Vitals  Enc Vitals Group     BP 12/06/21 1039 137/72     Pulse Rate 12/06/21 1039 91     Resp 12/06/21 1039 16     Temp 12/06/21 1039 98.5 F (36.9 C)     Temp Source 12/06/21 1039 Oral     SpO2 12/06/21 1039 98 %     Weight 12/06/21 1035 175 lb 14.8 oz (79.8 kg)     Height 12/06/21 1035 5' (1.524 m)     Head Circumference --      Peak Flow --      Pain Score 12/06/21 1035 2     Pain Loc --      Pain Edu? --      Excl. in [place]? --     Most recent vital signs: Vitals:   12/06/21 1039  BP: 137/72  Pulse: 91  Resp: 16  Temp: 98.5 F (36.9 C)  SpO2: 98%     General: Awake, no distress.  CV:  Good peripheral perfusion.  Resp:  Normal effort.  Abd:  No distention.  Tender in the lower abdomen Other:     ED Results / Procedures / Treatments   Labs (all labs ordered are listed, but only abnormal results are displayed) Labs Reviewed  CBC - Abnormal; Notable for the following components:      Result Value   RBC 3.59 (*)    Hemoglobin 10.8 (*)    HCT 33.2 (*)    All other components within normal limits  LIPASE, BLOOD  COMPREHENSIVE METABOLIC PANEL  URINALYSIS, ROUTINE W REFLEX MICROSCOPIC      RADIOLOGY I have reviewed the CT personally interpreted and patient has constipation  noted.  Her bladder is slightly thickened but not significantly distended.  PROCEDURES:  Critical Care performed: No  Procedures   MEDICATIONS ORDERED IN ED: Medications - No data to display   IMPRESSION / MDM / [place] / ED COURSE  I reviewed the triage vital signs and the nursing notes.   Patient's presentation is most consistent with acute presentation with potential threat to life or bodily function.   Differential includes diverticulitis, appendicitis, perforation, obstruction, constipation.  She has no upper abdominal pain no chest pain, no shortness of breath to suggest ACS.  Denies any falls or hitting her head  CBC shows stable hemoglobin around her baseline.  White count is normal CMP shows slightly low bicarb but her glucose is reassuring anion gap is normal.  Lipase is normal.  IMPRESSION: 1. Large amount of retained colonic stool, consistent with constipation. No evidence of bowel obstruction. 2. Bilateral renal cortical atrophy. No hydronephrosis or nephrolithiasis. 3. Thickening of the urinary bladder  wall, which may be due to cystitis. Correlate with urinalysis. 4. Aortic atherosclerosis. 5. Posterior spinal fusion at L5-S2. Multilevel degenerate disc disease of the lumbar spine with associated facet joint arthropathy.      We will get straight cath of urine to evaluate for UTI.  I suspect that some of this is related to constipation.  Patient does report some hard stool.  Recommended taking MiraLAX to help with constipation.  Dissipate patient be discharged back to facility after urine  Patient handed off to oncoming team pending urine   FINAL CLINICAL IMPRESSION(S) / ED DIAGNOSES   Final diagnoses:  Constipation, unspecified constipation type     Rx / DC Orders   ED Discharge Orders          Ordered    polyethylene glycol (MIRALAX) 17 g packet  Daily PRN        12/06/21 1346             Note:  This document was prepared  using Dragon voice recognition software and may include unintentional dictation errors.   Vanessa Arpin, MD 12/06/21 [phone]

## 2021-12-06 NOTE — ED Notes (Signed)
Sent msg to pharmacy to validate cefdinir so pt can receive then d/c.

## 2021-12-06 NOTE — ED Notes (Signed)
Called home number and spoke with caregiver. She will be on her way to come pick up pt and will bring dry pants.

## 2021-12-06 NOTE — ED Triage Notes (Signed)
First Nurse Note:  Pt via [facility] from [facility]. Pt has been there 6 days, pt c/o lower abd pain intermittently. Denies any urinary symptoms. Denies diarrhea. Pt is blind and HOH. Pt is A&Ox4 and NAD. VSS

## 2021-12-06 NOTE — ED Triage Notes (Signed)
States pain starts daily at 2-3 am and resolves daily by noon time.

## 2021-12-06 NOTE — ED Notes (Signed)
Pt to CT

## 2021-12-06 NOTE — ED Notes (Signed)
Pt in CT.

## 2021-12-08 ENCOUNTER — Telehealth: Payer: Self-pay | Admitting: Family Medicine

## 2021-12-08 LAB — URINE CULTURE: Culture: >=100000 — AB

## 2021-12-08 NOTE — Telephone Encounter (Signed)
Spoke with Angie and informed her to just keep the appt for 10.24.2023 because pt was not admitted into the hospital. She asked for a earlier time and I offered for her to see Dr Rosana Berger but she declined because she was not sure how Ms Taylor [name] would feel.

## 2021-12-08 NOTE — Telephone Encounter (Signed)
Copied from [place] [id number]. Topic: Appointment Scheduling - Scheduling Inquiry for Clinic >> Dec 08, 2021  1:50 PM Tiffany B wrote: Patient was seen in the hospital on 12/06/2021 and caller would like to know if PCP can have a hospital follow up appointment on any day prior 10 am. PCP has no hospital follow ups within in 2 weeks.

## 2021-12-09 LAB — QUANTIFERON-TB GOLD PLUS
Mitogen-NIL: >10 IU/mL
NIL: 0.08 IU/mL
QuantiFERON-TB Gold Plus: NEGATIVE
TB1-NIL: <0 IU/mL
TB2-NIL: <0 IU/mL

## 2021-12-11 NOTE — Progress Notes (Deleted)
Name: Taylor [name]   MRN: [id number]    DOB: 1932-10-08   Date:12/11/2021       Progress Note  Subjective  Chief Complaint  Stomach Issues  HPI  *** Patient Active Problem List   Diagnosis Date Noted   Mild protein-calorie malnutrition ([place]) 09/18/2021   GERD without esophagitis 09/18/2021   Obesity (BMI 30-39.9) 09/11/2021   Dizziness 09/10/2021   Closed compression fracture of body of L1 vertebra ([place]) 09/23/2020   Gastric polyp    Polyp of colon    Morbid obesity ([place]) 04/08/2018   Age-related osteoporosis without current pathological fracture 09/26/2017   Hyperparathyroidism ([place]) 08/06/2017   Stage 3a chronic kidney disease ([place]) 08/06/2017   HLD (hyperlipidemia) 07/25/2017   Osteoporotic compression fracture of spine ([place]) 03/22/2017   Assistance needed with transportation 03/21/2017   Iron deficiency anemia 12/19/2016   Aortic atherosclerosis ([place]) 11/23/2016   Anterolisthesis 11/23/2016   Vitamin D deficiency 08/19/2016   Anemia, unspecified 08/19/2016   Primary open angle glaucoma (POAG) of both eyes, severe stage 08/17/2016   Hypertensive retinopathy 08/17/2016   Long term current use of opiate analgesic 07/19/2016   Dyslipidemia associated with type 2 diabetes mellitus ([place]) 04/16/2016   Chronic midline low back pain without sciatica 04/16/2016   Chronic pain of right knee 04/16/2016   Vaginal dryness 02/22/2015   Calculus of kidney 11/10/2014   Osteoarthritis, multiple sites 09/09/2014   Type 2 diabetes mellitus with microalbuminuria ([place]) 09/09/2014   Glaucoma 09/09/2014   Benign hypertension with CKD (chronic kidney disease) stage III ([place]) 09/09/2014   Chronic pain 09/09/2014   Tinea corporis 03/88/8280   Umbilical hernia 03/49/1791   Mass of right breast    Diabetes mellitus without complication ([place]) 5056    Past Surgical History:  Procedure Laterality Date   ABDOMINAL HYSTERECTOMY  1958   menorrhagia   BACK SURGERY     BREAST BIOPSY Left  01/25/2012   BREAST BIOPSY Right 04/08/2019   hear clip, [place] bx, path pending   BREAST EXCISIONAL BIOPSY     BREAST LUMPECTOMY WITH NEEDLE LOCALIZATION Right 05/12/2019   Procedure: BREAST LUMPECTOMY WITH NEEDLE LOCALIZATION;  Surgeon: Olean Ree, MD;  Location: [facility] OR[facility];  Service: General;  Laterality: Right;   CATARACT EXTRACTION, BILATERAL Bilateral    1st 06/07/15 and the 2nd 06/21/15   CHOLECYSTECTOMY     COLONOSCOPY  2003   [facility]   COLONOSCOPY WITH PROPOFOL N/A 07/02/2019   Procedure: COLONOSCOPY WITH PROPOFOL;  Surgeon: Virgel Manifold, MD;  Location: [facility] END[facility];  Service: Endoscopy;  Laterality: N/A;   COLONOSCOPY WITH PROPOFOL N/A 07/16/2019   Procedure: COLONOSCOPY WITH PROPOFOL;  Surgeon: Virgel Manifold, MD;  Location: [facility] ENDOSCOPY;  Service: Endoscopy;  Laterality: N/A;   ESOPHAGOGASTRODUODENOSCOPY (EGD) WITH PROPOFOL N/A 07/02/2019   Procedure: ESOPHAGOGASTRODUODENOSCOPY (EGD) WITH PROPOFOL;  Surgeon: Virgel Manifold, MD;  Location: [facility] ENDOSCOPY;  Service: Endoscopy;  Laterality: N/A;   EYE SURGERY     KNEE SURGERY  2009,2011,2012   twice on right and once on left   Hilo SURGERY  2004   TONSILLECTOMY     age of 24    Family History  Problem Relation Age of Onset   Cancer Mother        skin cancer   Stroke Mother    Heart disease Father    Breast cancer Neg Hx     Social History   Tobacco  Use   Smoking status: Never   Smokeless tobacco: Never  Substance Use Topics   Alcohol use: No    Alcohol/week: 0.0 standard drinks of alcohol     Current Outpatient Medications:    acetaminophen (TYLENOL) 500 MG tablet, Take 1 tablet (500 mg total) by mouth 3 (three) times daily. (Patient taking differently: Take 500 mg by mouth every 6 (six) hours as needed for moderate pain or headache.), Disp: 90 tablet, Rfl: 0   amLODipine (NORVASC) 10 MG tablet, Take 1 tablet (10 mg total) by mouth daily., Disp: 90 tablet,  Rfl: 0   aspirin 81 MG chewable tablet, Chew 1 tablet (81 mg total) by mouth daily., Disp: 30 tablet, Rfl: 3   brimonidine (ALPHAGAN) 0.15 % ophthalmic solution, 1 drop 2 (two) times daily., Disp: , Rfl:    brimonidine (ALPHAGAN) 0.2 % ophthalmic solution, Place 1 drop into both eyes 2 (two) times daily., Disp: , Rfl:    brinzolamide (AZOPT) 1 % ophthalmic suspension, SMARTSIG:In Eye(s), Disp: , Rfl:    Calcium Carbonate-Vitamin D (OYSTER SHELL CALCIUM 500 + D PO), Take 1 tablet by mouth 2 (two) times a day., Disp: , Rfl:    cefdinir (OMNICEF) 300 MG capsule, Take 1 capsule (300 mg total) by mouth 2 (two) times daily for 7 days., Disp: 14 capsule, Rfl: 0   denosumab (PROLIA) 60 MG/ML SOSY injection, Inject into the skin., Disp: , Rfl:    diclofenac Sodium (VOLTAREN) 1 % GEL, Apply 2 g topically 4 (four) times daily., Disp: 150 g, Rfl: 0   Docusate Sodium 100 MG capsule, Take 100 mg by mouth 2 (two) times daily., Disp: , Rfl:    dorzolamide (TRUSOPT) 2 % ophthalmic solution, 1 drop 2 (two) times daily., Disp: , Rfl:    famotidine (PEPCID) 20 MG tablet, TAKE 1 TABLET BY MOUTH EVERY DAY, Disp: 90 tablet, Rfl: 1   ferrous sulfate 325 (65 FE) MG tablet, TAKE 1 TABLET BY MOUTH EVERY DAY WITH BREAKFAST, Disp: 90 tablet, Rfl: 0   fluticasone (FLONASE) 50 MCG/ACT nasal spray, SPRAY 2 SPRAYS INTO EACH NOSTRIL EVERY DAY, Disp: 48 mL, Rfl: 1   lactulose (CHRONULAC) 10 GM/15ML solution, TAKE 30 MLS (20 G TOTAL) BY MOUTH DAILY AS NEEDED FOR MILD CONSTIPATION., Disp: 120 mL, Rfl: 0   Lancets (ONETOUCH DELICA PLUS [id number]) MISC, 1 each by Does not apply route daily., Disp: 100 each, Rfl: 2   lubiprostone (AMITIZA) 24 MCG capsule, TAKE 1 CAPSULE (24 MCG TOTAL) BY MOUTH 2 (TWO) TIMES DAILY WITH A MEAL., Disp: 180 capsule, Rfl: 1   metFORMIN (GLUCOPHAGE-XR) 750 MG 24 hr tablet, TAKE 2 TABLETS (1,500 MG TOTAL) BY MOUTH EVERY DAY WITH BREAKFAST, Disp: 180 tablet, Rfl: 0   olmesartan (BENICAR) 20 MG tablet, TAKE 1  TABLET BY MOUTH EVERY DAY, Disp: 90 tablet, Rfl: 0   omega-3 acid ethyl esters (LOVAZA) 1 g capsule, TAKE 1 CAPSULE BY MOUTH TWICE A DAY, Disp: 180 capsule, Rfl: 0   ONETOUCH ULTRA test strip, USE AS DIRECTED, Disp: 100 strip, Rfl: 12   polyethylene glycol (MIRALAX) 17 g packet, Take 17 g by mouth daily as needed for up to 14 days for moderate constipation., Disp: 14 each, Rfl: 0   pravastatin (PRAVACHOL) 40 MG tablet, TAKE 1 TABLET BY MOUTH EVERYDAY AT BEDTIME, Disp: 90 tablet, Rfl: 0   ROCKLATAN 0.02-0.005 % SOLN, Apply 1 drop to eye at bedtime., Disp: , Rfl:    Vitamin D, Ergocalciferol, (DRISDOL) 1.25 MG (50000  UNIT) CAPS capsule, Take 50,000 Units by mouth every 7 (seven) days., Disp: , Rfl:   Allergies  Allergen Reactions   Ace Inhibitors Cough    I personally reviewed active problem list, medication list, allergies, family history, social history, health maintenance with the patient/caregiver today.   ROS  ***  Objective  There were no vitals filed for this visit.  There is no height or weight on file to calculate BMI.  Physical Exam ***  Recent Results (from the past 2160 hour(s))  Glucose, capillary     Status: Abnormal   Collection Time: 09/12/21 11:55 AM  Result Value Ref Range   Glucose-Capillary 177 (H) 70 - 99 mg/dL    Comment: Glucose reference range applies only to samples taken after fasting for at least 8 hours.  POCT HgB A1C     Status: Abnormal   Collection Time: 09/18/21 10:23 AM  Result Value Ref Range   Hemoglobin A1C 6.5 (A) 4.0 - 5.6 %   HbA1c POC (<> result, manual entry)     HbA1c, POC (prediabetic range)     HbA1c, POC (controlled diabetic range)    COMPLETE METABOLIC PANEL WITH GFR     Status: Abnormal   Collection Time: 09/18/21 10:54 AM  Result Value Ref Range   Glucose, Bld 197 (H) 65 - 99 mg/dL    Comment: .            Fasting reference interval . For someone without known diabetes, a glucose value >125 mg/dL indicates that they may  have diabetes and this should be confirmed with a follow-up test. .    BUN 31 (H) 7 - 25 mg/dL   Creat 1.80 (H) 0.60 - 0.95 mg/dL   eGFR 27 (L) > OR = 60 mL/min/1.26m   BUN/Creatinine Ratio 17 6 - 22 (calc)   Sodium 142 135 - 146 mmol/L   Potassium 5.1 3.5 - 5.3 mmol/L   Chloride 107 98 - 110 mmol/L   CO2 23 20 - 32 mmol/L   Calcium 9.6 8.6 - 10.4 mg/dL   Total Protein 6.5 6.1 - 8.1 g/dL   Albumin 3.9 3.6 - 5.1 g/dL   Globulin 2.6 1.9 - 3.7 g/dL (calc)   AG Ratio 1.5 1.0 - 2.5 (calc)   Total Bilirubin 0.4 0.2 - 1.2 mg/dL   Alkaline phosphatase (APISO) 37 37 - 153 U/L   AST 8 (L) 10 - 35 U/L   ALT 7 6 - 29 U/L  CBC with Differential/Platelet     Status: Abnormal   Collection Time: 09/18/21 10:54 AM  Result Value Ref Range   WBC 6.1 3.8 - 10.8 Thousand/uL   RBC 3.43 (L) 3.80 - 5.10 Million/uL   Hemoglobin 11.3 (L) 11.7 - 15.5 g/dL   HCT 34.0 (L) 35.0 - 45.0 %   MCV 99.1 80.0 - 100.0 fL   MCH 32.9 27.0 - 33.0 pg   MCHC 33.2 32.0 - 36.0 g/dL   RDW 13.5 11.0 - 15.0 %   Platelets 342 140 - 400 Thousand/uL   MPV 9.3 7.5 - 12.5 fL   Neutro Abs 3,642 1,500 - 7,800 cells/uL   Lymphs Abs 1,732 850 - 3,900 cells/uL   Absolute Monocytes 561 200 - 950 cells/uL   Eosinophils Absolute 122 15 - 500 cells/uL   Basophils Absolute 43 0 - 200 cells/uL   Neutrophils Relative % 59.7 %   Total Lymphocyte 28.4 %   Monocytes Relative 9.2 %   Eosinophils Relative 2.0 %  Basophils Relative 0.7 %  QuantiFERON-TB Gold Plus     Status: None   Collection Time: 12/05/21  9:19 AM  Result Value Ref Range   QuantiFERON-TB Gold Plus NEGATIVE NEGATIVE    Comment: Negative test result. M. tuberculosis complex  infection unlikely.    NIL 0.08 IU/mL   Mitogen-NIL >10.00 IU/mL   TB1-NIL <0.00 IU/mL   TB2-NIL <0.00 IU/mL    Comment: . The Nil tube value reflects the background interferon gamma immune response of the patient's blood sample. This value has been subtracted from the  patient's displayed TB and Mitogen results. . Lower than expected results with the Mitogen tube prevent false-negative Quantiferon readings by detecting a patient with a potential immune suppressive condition and/or suboptimal pre-analytical specimen handling. . The TB1 Antigen tube is coated with the M. tuberculosis-specific antigens designed to elicit responses from TB antigen primed CD4+ helper T-lymphocytes. . The TB2 Antigen tube is coated with the M. tuberculosis-specific antigens designed to elicit responses from TB antigen primed CD4+ helper and CD8+ cytotoxic T-lymphocytes. . For additional information, please refer to https://education.questdiagnostics.com/faq/FAQ204 (This link is being provided for informational/ educational purposes only.) .   Lipase, blood     Status: None   Collection Time: 12/06/21 10:40 AM  Result Value Ref Range   Lipase 25 11 - 51 U/L    Comment: Performed at [facility], [address] [place], [place] [address]  Comprehensive metabolic panel     Status: Abnormal   Collection Time: 12/06/21 10:40 AM  Result Value Ref Range   Sodium 140 135 - 145 mmol/L   Potassium 3.9 3.5 - 5.1 mmol/L   Chloride 112 (H) 98 - 111 mmol/L   CO2 19 (L) 22 - 32 mmol/L   Glucose, Bld 100 (H) 70 - 99 mg/dL    Comment: Glucose reference range applies only to samples taken after fasting for at least 8 hours.   BUN 24 (H) 8 - 23 mg/dL   Creatinine, Ser 1.31 (H) 0.44 - 1.00 mg/dL   Calcium 9.0 8.9 - 10.3 mg/dL   Total Protein 6.8 6.5 - 8.1 g/dL   Albumin 3.4 (L) 3.5 - 5.0 g/dL   AST 13 (L) 15 - 41 U/L   ALT 8 0 - 44 U/L   Alkaline Phosphatase 49 38 - 126 U/L   Total Bilirubin 0.3 0.3 - 1.2 mg/dL   GFR, Estimated 40 (L) >60 mL/min    Comment: (NOTE) Calculated using the CKD-EPI Creatinine Equation (2021)    Anion gap 9 5 - 15    Comment: Performed at [facility], [address] [place], [place] [address]  CBC     Status: Abnormal    Collection Time: 12/06/21 10:40 AM  Result Value Ref Range   WBC 8.2 4.0 - 10.5 K/uL   RBC 3.59 (L) 3.87 - 5.11 MIL/uL   Hemoglobin 10.8 (L) 12.0 - 15.0 g/dL   HCT 33.2 (L) 36.0 - 46.0 %   MCV 92.5 80.0 - 100.0 fL   MCH 30.1 26.0 - 34.0 pg   MCHC 32.5 30.0 - 36.0 g/dL   RDW 13.2 11.5 - 15.5 %   Platelets 396 150 - 400 K/uL   nRBC 0.0 0.0 - 0.2 %    Comment: Performed at [facility], [address] [place],  [address]  Urinalysis, Routine w reflex microscopic Urine, Clean Catch     Status: Abnormal   Collection Time: 12/06/21  2:53 PM  Result  Value Ref Range   Color, Urine YELLOW (A) YELLOW   APPearance CLOUDY (A) CLEAR   Specific Gravity, Urine 1.021 1.005 - 1.030   pH 6.0 5.0 - 8.0   Glucose, UA NEGATIVE NEGATIVE mg/dL   Hgb urine dipstick SMALL (A) NEGATIVE   Bilirubin Urine NEGATIVE NEGATIVE   Ketones, ur NEGATIVE NEGATIVE mg/dL   Protein, ur NEGATIVE NEGATIVE mg/dL   Nitrite NEGATIVE NEGATIVE   Leukocytes,Ua LARGE (A) NEGATIVE   RBC / HPF 0-5 0 - 5 RBC/hpf   WBC, UA >50 (H) 0 - 5 WBC/hpf   Bacteria, UA NONE SEEN NONE SEEN   Squamous Epithelial / LPF 11-20 0 - 5    Comment: Performed at [facility], [address], [place], [place] [address]  Urine Culture     Status: Abnormal   Collection Time: 12/06/21  2:53 PM   Specimen: Urine, Clean Catch  Result Value Ref Range   Specimen Description      URINE, CLEAN CATCH Performed at [facility], [address] [place], [place] [address]    Special Requests      NONE Performed at [facility], [place], [place] [address]    Culture (A)     >=100,000 COLONIES/mL AEROCOCCUS SPECIES Standardized susceptibility testing for this organism is not available. Performed at [facility], [place] [address], [place], [place] [address]    Report Status 12/08/2021 FINAL     PHQ2/9:    11/22/2021    2:38 PM 09/28/2021    1:27 PM 09/18/2021   10:14 AM 06/27/2021     3:07 PM 05/09/2021    2:56 PM  Depression screen PHQ 2/9  Decreased Interest 0 0 0 0 0  Down, Depressed, Hopeless 0 0 0 0 0  PHQ - 2 Score 0 0 0 0 0  Altered sleeping   0    Tired, decreased energy   0    Change in appetite   0    Feeling bad or failure about yourself    0    Trouble concentrating   0    Moving slowly or fidgety/restless   0    Suicidal thoughts   0    PHQ-9 Score   0      phq 9 is {gen pos [id number]}   Fall Risk:    11/22/2021    2:37 PM 09/18/2021   10:14 AM 06/27/2021    3:07 PM 05/09/2021    2:57 PM 03/24/2021    2:19 PM  Fall Risk   Falls in the past year? 1 0 0 1 1  Number falls in past yr: 1 0  0 0  Injury with Fall? 1 0  0 0  Risk for fall due to : Impaired balance/gait;Impaired mobility Impaired balance/gait;Impaired mobility  History of fall(s);Impaired mobility;Impaired balance/gait Impaired mobility;Impaired balance/gait  Follow up Falls prevention discussed Falls prevention discussed  Falls prevention discussed Falls prevention discussed      Functional Status Survey:      Assessment & Plan  *** There are no diagnoses linked to this encounter.

## 2021-12-12 ENCOUNTER — Ambulatory Visit: Payer: Medicare Other | Admitting: Family Medicine

## 2021-12-12 ENCOUNTER — Telehealth: Payer: Self-pay | Admitting: Family Medicine

## 2021-12-12 NOTE — Telephone Encounter (Signed)
Caller states they need an order stating how many times to check patient's blood sugar a day  Caller states pts endocrinologist advised her to obtain order from pcp  Please assist further  Fax  [phone]

## 2021-12-13 NOTE — Telephone Encounter (Signed)
Typed letter stating check blood sugar once daily, but if not feeling well can check more often. Letter faxed to Angie at [facility].

## 2021-12-13 NOTE — Telephone Encounter (Unsigned)
Copied from [place] [id number]. Topic: Quick Communication - Home Health Verbal Orders >> Dec 13, 2021 11:03 AM Leilani Able wrote: Caller/Agency: [facility] Callback Number: [phone] Requesting/  PT/only Frequency: cont PT 2 times a wk for 4 wks

## 2021-12-13 NOTE — Telephone Encounter (Signed)
Verbal orders given  

## 2021-12-16 ENCOUNTER — Other Ambulatory Visit: Payer: Self-pay | Admitting: Family Medicine

## 2021-12-16 DIAGNOSIS — K5909 Other constipation: Secondary | ICD-10-CM

## 2021-12-16 DIAGNOSIS — R790 Abnormal level of blood mineral: Secondary | ICD-10-CM

## 2021-12-16 DIAGNOSIS — E7800 Pure hypercholesterolemia, unspecified: Secondary | ICD-10-CM

## 2021-12-16 DIAGNOSIS — K219 Gastro-esophageal reflux disease without esophagitis: Secondary | ICD-10-CM

## 2021-12-16 DIAGNOSIS — D649 Anemia, unspecified: Secondary | ICD-10-CM

## 2021-12-18 NOTE — Telephone Encounter (Signed)
On our records she should be good on all except olmesartan.  Looks like she should of been out of that one?

## 2021-12-20 ENCOUNTER — Encounter: Payer: Self-pay | Admitting: Anesthesiology

## 2021-12-20 ENCOUNTER — Ambulatory Visit: Payer: Medicare Other | Attending: Anesthesiology | Admitting: Anesthesiology

## 2021-12-20 DIAGNOSIS — M159 Polyosteoarthritis, unspecified: Secondary | ICD-10-CM

## 2021-12-20 DIAGNOSIS — G8929 Other chronic pain: Secondary | ICD-10-CM

## 2021-12-20 DIAGNOSIS — G894 Chronic pain syndrome: Secondary | ICD-10-CM | POA: Diagnosis not present

## 2021-12-20 DIAGNOSIS — Z79891 Long term (current) use of opiate analgesic: Secondary | ICD-10-CM | POA: Diagnosis not present

## 2021-12-20 DIAGNOSIS — M25562 Pain in left knee: Secondary | ICD-10-CM

## 2021-12-20 DIAGNOSIS — M25561 Pain in right knee: Secondary | ICD-10-CM

## 2021-12-20 DIAGNOSIS — M545 Other chronic pain: Secondary | ICD-10-CM

## 2021-12-20 DIAGNOSIS — M79642 Pain in left hand: Secondary | ICD-10-CM

## 2021-12-20 DIAGNOSIS — M79641 Pain in right hand: Secondary | ICD-10-CM

## 2021-12-20 MED ORDER — OXYCODONE-ACETAMINOPHEN 7.5-325 MG PO TABS: 1.0000 | ORAL_TABLET | Freq: Three times a day (TID) | ORAL | 0 refills | Status: AC | PRN

## 2021-12-20 MED ORDER — OXYCODONE-ACETAMINOPHEN 7.5-325 MG PO TABS: 1.0000 | ORAL_TABLET | Freq: Three times a day (TID) | ORAL | 0 refills | Status: DC | PRN

## 2021-12-20 NOTE — Progress Notes (Signed)
Virtual Visit via Telephone Note  I connected with Taylor [name] on 12/20/21 at  1:00 PM EDT by telephone and verified that I am speaking with the correct person using two identifiers.  Location: Patient: Home Provider: Pain control center   I discussed the limitations, risks, security and privacy concerns of performing an evaluation and management service by telephone and the availability of in person appointments. I also discussed with the patient that there may be a patient responsible charge related to this service. The patient expressed understanding and agreed to proceed.   History of Present Illness: I spoke with Taylor [name] via telephone.  We were unable to connect via video.  She reports that her pain in her hands low back legs is stable in nature with no recent changes reported.  She still taking Percocet 7.5 mg tablets 3 times a day and getting good relief with this regimen.  No side effects reported.  Quality characteristic and distribution of her pain remained stable.  She is try to stay active with her current regimen and these medications enable her to do so where she has failed more conservative therapy.  Otherwise her lower extremity strength function bowel and bladder function are stable with no side effects or other problems reported.  Review of systems: General: No fevers or chills Pulmonary: No shortness of breath or dyspnea Cardiac: No angina or palpitations or lightheadedness GI: No abdominal pain or constipation Psych: No depression    Observations/Objective:  Current Outpatient Medications:    oxyCODONE-acetaminophen (PERCOCET) 7.5-325 MG tablet, Take 1 tablet by mouth every 8 (eight) hours as needed for moderate pain or severe pain., Disp: 90 tablet, Rfl: 0   [START ON 01/19/2022] oxyCODONE-acetaminophen (PERCOCET) 7.5-325 MG tablet, Take 1 tablet by mouth every 8 (eight) hours as needed for moderate pain or severe pain., Disp: 90 tablet, Rfl: 0   acetaminophen  (TYLENOL) 500 MG tablet, Take 1 tablet (500 mg total) by mouth 3 (three) times daily. (Patient taking differently: Take 500 mg by mouth every 6 (six) hours as needed for moderate pain or headache.), Disp: 90 tablet, Rfl: 0   amLODipine (NORVASC) 10 MG tablet, Take 1 tablet (10 mg total) by mouth daily., Disp: 90 tablet, Rfl: 0   aspirin 81 MG chewable tablet, Chew 1 tablet (81 mg total) by mouth daily., Disp: 30 tablet, Rfl: 3   brimonidine (ALPHAGAN) 0.15 % ophthalmic solution, 1 drop 2 (two) times daily., Disp: , Rfl:    brimonidine (ALPHAGAN) 0.2 % ophthalmic solution, Place 1 drop into both eyes 2 (two) times daily., Disp: , Rfl:    brinzolamide (AZOPT) 1 % ophthalmic suspension, SMARTSIG:In Eye(s), Disp: , Rfl:    Calcium Carbonate-Vitamin D (OYSTER SHELL CALCIUM 500 + D PO), Take 1 tablet by mouth 2 (two) times a day., Disp: , Rfl:    denosumab (PROLIA) 60 MG/ML SOSY injection, Inject into the skin., Disp: , Rfl:    diclofenac Sodium (VOLTAREN) 1 % GEL, Apply 2 g topically 4 (four) times daily., Disp: 150 g, Rfl: 0   Docusate Sodium 100 MG capsule, Take 100 mg by mouth 2 (two) times daily., Disp: , Rfl:    dorzolamide (TRUSOPT) 2 % ophthalmic solution, 1 drop 2 (two) times daily., Disp: , Rfl:    famotidine (PEPCID) 20 MG tablet, TAKE 1 TABLET BY MOUTH EVERY DAY, Disp: 90 tablet, Rfl: 1   ferrous sulfate 325 (65 FE) MG tablet, TAKE 1 TABLET BY MOUTH EVERY DAY WITH BREAKFAST, Disp: 90  tablet, Rfl: 0   fluticasone (FLONASE) 50 MCG/ACT nasal spray, SPRAY 2 SPRAYS INTO EACH NOSTRIL EVERY DAY, Disp: 48 mL, Rfl: 1   lactulose (CHRONULAC) 10 GM/15ML solution, TAKE 30 MLS (20 G TOTAL) BY MOUTH DAILY AS NEEDED FOR MILD CONSTIPATION., Disp: 120 mL, Rfl: 0   Lancets (ONETOUCH DELICA PLUS [id number]) MISC, 1 each by Does not apply route daily., Disp: 100 each, Rfl: 2   lubiprostone (AMITIZA) 24 MCG capsule, TAKE 1 CAPSULE (24 MCG TOTAL) BY MOUTH 2 (TWO) TIMES DAILY WITH A MEAL., Disp: 180 capsule, Rfl: 1    metFORMIN (GLUCOPHAGE-XR) 750 MG 24 hr tablet, TAKE 2 TABLETS (1,500 MG TOTAL) BY MOUTH EVERY DAY WITH BREAKFAST, Disp: 180 tablet, Rfl: 0   olmesartan (BENICAR) 20 MG tablet, TAKE 1 TABLET BY MOUTH EVERY DAY, Disp: 90 tablet, Rfl: 0   omega-3 acid ethyl esters (LOVAZA) 1 g capsule, TAKE 1 CAPSULE BY MOUTH TWICE A DAY, Disp: 180 capsule, Rfl: 0   ONETOUCH ULTRA test strip, USE AS DIRECTED, Disp: 100 strip, Rfl: 12   polyethylene glycol (MIRALAX) 17 g packet, Take 17 g by mouth daily as needed for up to 14 days for moderate constipation., Disp: 14 each, Rfl: 0   pravastatin (PRAVACHOL) 40 MG tablet, TAKE 1 TABLET BY MOUTH EVERYDAY AT BEDTIME, Disp: 90 tablet, Rfl: 0   ROCKLATAN 0.02-0.005 % SOLN, Apply 1 drop to eye at bedtime., Disp: , Rfl:    Vitamin D, Ergocalciferol, (DRISDOL) 1.25 MG (50000 UNIT) CAPS capsule, Take 50,000 Units by mouth every 7 (seven) days., Disp: , Rfl:    Past Medical History:  Diagnosis Date   Abnormal mammogram, unspecified 2013   Anemia    Arthritis    Breast screening, unspecified 2013   Diabetes mellitus without complication ([place]) 5329   non insulin dependent   Dysrhythmia    IRREGULAR HEART BEAT   GERD (gastroesophageal reflux disease)    Glaucoma 2003   Gout    History of kidney stones    H/O   Hyperlipidemia 2008   Hypertension 1980's   Lump or mass in breast 01/03/2012   left breast   Obesity, unspecified 2013   Osteoporosis    Shingles 2013   Special screening for malignant neoplasms, colon 2013     Assessment and Plan: 1. Chronic pain syndrome   2. Long term prescription opiate use   3. Osteoarthritis of multiple joints, unspecified osteoarthritis type   4. Chronic pain of right knee   5. Chronic midline low back pain without sciatica   6. Pain in both hands   7. Chronic pain of both knees   Based on our discussion today it is appropriate to refill her medicines.  I have reviewed the [facility] practitioner database information and is  appropriate and she is overdue for medication refill.  Today's prescription will be dated for #1 in December 1 with a schedule return to clinic in 2 months.  I talked her about continue with certain stretches and continuing efforts at ambulation as best possible.  Continue follow-up with her primary care physicians for baseline medical care.  Follow Up Instructions:    I discussed the assessment and treatment plan with the patient. The patient was provided an opportunity to ask questions and all were answered. The patient agreed with the plan and demonstrated an understanding of the instructions.   The patient was advised to call back or seek an in-person evaluation if the symptoms worsen or if the condition  fails to improve as anticipated.  I provided 30 minutes of non-face-to-face time during this encounter.   [name], MD

## 2021-12-25 NOTE — Telephone Encounter (Signed)
Care facility called reporting that the patient must have prescriptions with labels, even for OTC.   Docusate Sodium  Flonase  Tylenol 500 MG.   [id number] [place] - [address]  [id number] Phone: [phone]: [phone]  Requesting prescriptions for these

## 2021-12-27 ENCOUNTER — Other Ambulatory Visit: Payer: Self-pay | Admitting: Family Medicine

## 2021-12-27 DIAGNOSIS — E1129 Type 2 diabetes mellitus with other diabetic kidney complication: Secondary | ICD-10-CM

## 2021-12-27 NOTE — Telephone Encounter (Signed)
No vm set up. 

## 2021-12-27 NOTE — Telephone Encounter (Signed)
Last RF 11/13/21 #180 too   Requested Prescriptions  Refused Prescriptions Disp Refills   metFORMIN (GLUCOPHAGE-XR) 750 MG 24 hr tablet [Pharmacy Med Name: METFORMIN HCL ER 750 MG TABLET] 180 tablet 0    Sig: TAKE 2 TABLETS (1,500 MG TOTAL) BY MOUTH EVERY DAY WITH BREAKFAST     Endocrinology:  Diabetes - Biguanides Failed - 12/27/2021 11:24 AM      Failed - Cr in normal range and within 360 days    Creat  Date Value Ref Range Status  09/18/2021 1.80 (H) 0.60 - 0.95 mg/dL Final   Creatinine, Ser  Date Value Ref Range Status  12/06/2021 1.31 (H) 0.44 - 1.00 mg/dL Final   Creatinine, Urine  Date Value Ref Range Status  03/24/2021 99 20 - 275 mg/dL Final         Failed - eGFR in normal range and within 360 days    GFR, Est African American  Date Value Ref Range Status  12/28/2019 42 (L) > OR = 60 mL/min/1.11m Final   GFR, Est Non African American  Date Value Ref Range Status  12/28/2019 36 (L) > OR = 60 mL/min/1.733mFinal   GFR, Estimated  Date Value Ref Range Status  12/06/2021 40 (L) >60 mL/min Final    Comment:    (NOTE) Calculated using the CKD-EPI Creatinine Equation (2021)    eGFR  Date Value Ref Range Status  09/18/2021 27 (L) > OR = 60 mL/min/1.7351minal         Failed - B12 Level in normal range and within 720 days    No results found for: "VITAMINB12"       Passed - HBA1C is between 0 and 7.9 and within 180 days    Hemoglobin A1C  Date Value Ref Range Status  09/18/2021 6.5 (A) 4.0 - 5.6 % Final   HbA1c, POC (prediabetic range)  Date Value Ref Range Status  08/05/2017 7.8 (A) 5.7 - 6.4 % Final   HbA1c, POC (controlled diabetic range)  Date Value Ref Range Status  04/17/2019 6.5 0.0 - 7.0 % Final         Passed - Valid encounter within last 6 months    Recent Outpatient Visits           1 month ago [facility]   3 months ago Type 2 diabetes mellitus with microalbuminuria, without  long-term current use of insulin ([facility]   9 months ago Dyslipidemia associated with type 2 diabetes mellitus ([facility]   1 year ago Hypertension associated with type 2 diabetes mellitus [facility]   1 year ago Need for assistance due to unsteady gait   [facility]       Future Appointments             In 3 weeks [facility] S[facility] within normal limits and completed in the last 12 months    WBC  Date Value Ref Range Status  12/06/2021 8.2 4.0 - 10.5 K/uL Final   RBC  Date Value Ref Range Status  12/06/2021 3.59 (L) 3.87 - 5.11 MIL/uL Final   Hemoglobin  Date Value Ref Range Status  12/06/2021  10.8 (L) 12.0 - 15.0 g/dL Final   HCT  Date Value Ref Range Status  12/06/2021 33.2 (L) 36.0 - 46.0 % Final   MCHC  Date Value Ref Range Status  12/06/2021 32.5 30.0 - 36.0 g/dL Final   [facility]  Date Value Ref Range Status  12/06/2021 30.1 26.0 - 34.0 pg Final   MCV  Date Value Ref Range Status  12/06/2021 92.5 80.0 - 100.0 fL Final   No results found for: "PLTCOUNTKUC", "LABPLAT", "POCPLA" RDW  Date Value Ref Range Status  12/06/2021 13.2 11.5 - 15.5 % Final

## 2022-01-01 ENCOUNTER — Other Ambulatory Visit: Payer: Self-pay

## 2022-01-01 DIAGNOSIS — J3089 Other allergic rhinitis: Secondary | ICD-10-CM

## 2022-01-01 MED ORDER — DOCUSATE SODIUM 100 MG PO TABS: 100.0000 mg | ORAL_TABLET | Freq: Two times a day (BID) | ORAL | 0 refills | Status: DC

## 2022-01-01 MED ORDER — FLUTICASONE PROPIONATE 50 MCG/ACT NA SUSP: NASAL | 1 refills | Status: DC

## 2022-01-02 ENCOUNTER — Other Ambulatory Visit: Payer: Self-pay | Admitting: Family Medicine

## 2022-01-02 DIAGNOSIS — R809 Proteinuria, unspecified: Secondary | ICD-10-CM

## 2022-01-04 ENCOUNTER — Other Ambulatory Visit: Payer: Self-pay | Admitting: Family Medicine

## 2022-01-04 DIAGNOSIS — E1129 Type 2 diabetes mellitus with other diabetic kidney complication: Secondary | ICD-10-CM

## 2022-01-18 NOTE — Progress Notes (Deleted)
Name: Taylor [name]   MRN: [id number]    DOB: 06-07-1932   Date:01/18/2022       Progress Note  Subjective  Chief Complaint  Follow Up  HPI  DM: she forgot to bring her sugar logs, she is taking metformin 750 mg daily, today A 1 C 6.5 %   She denies polyphagia, polydipsia or polyuria. . She has a history of microalbuminuria and has been taking  Benicar 20 mg  She has CKI and is under the care of nephrologist. She takes Lovaza and statin for dyslipidemia and denies side effects. She also has associated Obesity, HTN. No hypoglycemia   Malnutrition she has lost 16 lbs in the past 4 months , discussed high calorie diet    HTN with CKI stage III: also has secondary hyperparathyroidism, last Pth was 93 back in 09/22  under the care of nephrologist, Dr. Candiss Norse. No pruritis noticed, BP today is at goal, during hospital stay they increased dose of norvasc from 5 to 10 mg    Chronic pain: she has chronic right knee pain, under the care of pain clinic and uses a walker to assist with ambulation . She also has daily back pain and feels weak on her back, she is under the care of Dr. Andree Elk , she states she has difficulty taking care of her house, preparing meals, unable to get in her bath tub due to pain on right knee. She still takes pain medication   GERD: well controlled with Pepcid, no heartburn or indigestion, denies vomiting . Unchanged   Chronic constipation: she only has bowel movements about every two weeks, she still only takes medication prn, advised to take it more often She has Amitiza on active medication list, she states currently only taking it once a day, advised to take it twice daily   Atherosclerosis aorta: on statin therapy not on aspirin because of  high risk of falls Unchanged .    Osteoporosis: seeing Dr. Honor Junes for Prolia and tolerated it well,  next infusion 10/2021   Patient lives alone and is not interested in placement. Daughter lives in [place] and her son is a Dietitian and is usually not in town, came in today with a church friend. She cannot see well, difficulty hearing , since hospital discharge her daughter is staying with her and patient thinks she will go to [place] with her. I asked why her daughter did not come with her and she said " Because she didn't want to"  Patient Active Problem List   Diagnosis Date Noted   Mild protein-calorie malnutrition ([place]) 09/18/2021   GERD without esophagitis 09/18/2021   Obesity (BMI 30-39.9) 09/11/2021   Dizziness 09/10/2021   Closed compression fracture of body of L1 vertebra ([place]) 09/23/2020   Gastric polyp    Polyp of colon    Morbid obesity ([place]) 04/08/2018   Age-related osteoporosis without current pathological fracture 09/26/2017   Hyperparathyroidism ([place]) 08/06/2017   Stage 3a chronic kidney disease ([place]) 08/06/2017   HLD (hyperlipidemia) 07/25/2017   Osteoporotic compression fracture of spine ([place]) 03/22/2017   Assistance needed with transportation 03/21/2017   Iron deficiency anemia 12/19/2016   Aortic atherosclerosis ([place]) 11/23/2016   Anterolisthesis 11/23/2016   Vitamin D deficiency 08/19/2016   Anemia, unspecified 08/19/2016   Primary open angle glaucoma (POAG) of both eyes, severe stage 08/17/2016   Hypertensive retinopathy 08/17/2016   Long term current use of opiate analgesic 07/19/2016   Dyslipidemia associated with type 2 diabetes  mellitus ([place]) 04/16/2016   Chronic midline low back pain without sciatica 04/16/2016   Chronic pain of right knee 04/16/2016   Vaginal dryness 02/22/2015   Calculus of kidney 11/10/2014   Osteoarthritis, multiple sites 09/09/2014   Type 2 diabetes mellitus with microalbuminuria ([place]) 09/09/2014   Glaucoma 09/09/2014   Benign hypertension with CKD (chronic kidney disease) stage III ([place]) 09/09/2014   Chronic pain 09/09/2014   Tinea corporis 16/11/9602   Umbilical hernia 54/10/8117   Mass of right breast    Diabetes mellitus without complication  ([place]) 1478    Past Surgical History:  Procedure Laterality Date   ABDOMINAL HYSTERECTOMY  1958   menorrhagia   BACK SURGERY     BREAST BIOPSY Left 01/25/2012   BREAST BIOPSY Right 04/08/2019   hear clip, [place] bx, path pending   BREAST EXCISIONAL BIOPSY     BREAST LUMPECTOMY WITH NEEDLE LOCALIZATION Right 05/12/2019   Procedure: BREAST LUMPECTOMY WITH NEEDLE LOCALIZATION;  Surgeon: Olean Ree, MD;  Location: [facility];  Service: General;  Laterality: Right;   CATARACT EXTRACTION, BILATERAL Bilateral    1st 06/07/15 and the 2nd 06/21/15   CHOLECYSTECTOMY     COLONOSCOPY  2003   [facility]   COLONOSCOPY WITH PROPOFOL N/A 07/02/2019   Procedure: COLONOSCOPY WITH PROPOFOL;  Surgeon: Virgel Manifold, MD;  Location: [facility];  Service: Endoscopy;  Laterality: N/A;   COLONOSCOPY WITH PROPOFOL N/A 07/16/2019   Procedure: COLONOSCOPY WITH PROPOFOL;  Surgeon: Virgel Manifold, MD;  Location: [facility];  Service: Endoscopy;  Laterality: N/A;   ESOPHAGOGASTRODUODENOSCOPY (EGD) WITH PROPOFOL N/A 07/02/2019   Procedure: ESOPHAGOGASTRODUODENOSCOPY (EGD) WITH PROPOFOL;  Surgeon: Virgel Manifold, MD;  Location: [facility];  Service: Endoscopy;  Laterality: N/A;   EYE SURGERY     KNEE SURGERY  2009,2011,2012   twice on right and once on left   [place] SURGERY  2004   TONSILLECTOMY     age of 89    Family History  Problem Relation Age of Onset   Cancer Mother        skin cancer   Stroke Mother    Heart disease Father    Breast cancer Neg Hx     Social History   Tobacco Use   Smoking status: Never   Smokeless tobacco: Never  Substance Use Topics   Alcohol use: No    Alcohol/week: 0.0 standard drinks of alcohol     Current Outpatient Medications:    acetaminophen (TYLENOL) 500 MG tablet, Take 1 tablet (500 mg total) by mouth 3 (three) times daily. (Patient taking differently: Take 500 mg by mouth every 6 (six) hours as needed for  moderate pain or headache.), Disp: 90 tablet, Rfl: 0   amLODipine (NORVASC) 10 MG tablet, Take 1 tablet (10 mg total) by mouth daily., Disp: 90 tablet, Rfl: 0   aspirin 81 MG chewable tablet, Chew 1 tablet (81 mg total) by mouth daily., Disp: 30 tablet, Rfl: 3   brimonidine (ALPHAGAN) 0.15 % ophthalmic solution, 1 drop 2 (two) times daily., Disp: , Rfl:    brimonidine (ALPHAGAN) 0.2 % ophthalmic solution, Place 1 drop into both eyes 2 (two) times daily., Disp: , Rfl:    brinzolamide (AZOPT) 1 % ophthalmic suspension, SMARTSIG:In Eye(s), Disp: , Rfl:    Calcium Carbonate-Vitamin D (OYSTER SHELL CALCIUM 500 + D PO), Take 1 tablet by mouth 2 (two) times a day., Disp: , Rfl:  denosumab (PROLIA) 60 MG/ML SOSY injection, Inject into the skin., Disp: , Rfl:    diclofenac Sodium (VOLTAREN) 1 % GEL, Apply 2 g topically 4 (four) times daily., Disp: 150 g, Rfl: 0   Docusate Sodium 100 MG capsule, Take 1 tablet (100 mg total) by mouth 2 (two) times daily., Disp: 180 tablet, Rfl: 0   dorzolamide (TRUSOPT) 2 % ophthalmic solution, 1 drop 2 (two) times daily., Disp: , Rfl:    famotidine (PEPCID) 20 MG tablet, TAKE 1 TABLET BY MOUTH EVERY DAY, Disp: 90 tablet, Rfl: 1   ferrous sulfate 325 (65 FE) MG tablet, TAKE 1 TABLET BY MOUTH EVERY DAY WITH BREAKFAST, Disp: 90 tablet, Rfl: 0   fluticasone (FLONASE) 50 MCG/ACT nasal spray, 2 sprays in each nostril daily, Disp: 48 mL, Rfl: 1   lactulose (CHRONULAC) 10 GM/15ML solution, TAKE 30 MLS (20 G TOTAL) BY MOUTH DAILY AS NEEDED FOR MILD CONSTIPATION., Disp: 120 mL, Rfl: 0   Lancets (ONETOUCH DELICA PLUS [id number]) MISC, 1 each by Does not apply route daily., Disp: 100 each, Rfl: 2   lubiprostone (AMITIZA) 24 MCG capsule, TAKE 1 CAPSULE (24 MCG TOTAL) BY MOUTH 2 (TWO) TIMES DAILY WITH A MEAL., Disp: 180 capsule, Rfl: 1   metFORMIN (GLUCOPHAGE-XR) 750 MG 24 hr tablet, TAKE 2 TABLETS (1,500 MG TOTAL) BY MOUTH EVERY DAY WITH BREAKFAST, Disp: 180 tablet, Rfl: 0   olmesartan  (BENICAR) 20 MG tablet, TAKE 1 TABLET BY MOUTH EVERY DAY, Disp: 90 tablet, Rfl: 0   omega-3 acid ethyl esters (LOVAZA) 1 g capsule, TAKE 1 CAPSULE BY MOUTH TWICE A DAY, Disp: 180 capsule, Rfl: 0   ONETOUCH ULTRA test strip, USE AS DIRECTED, Disp: 100 strip, Rfl: 12   oxyCODONE-acetaminophen (PERCOCET) 7.5-325 MG tablet, Take 1 tablet by mouth every 8 (eight) hours as needed for moderate pain or severe pain., Disp: 90 tablet, Rfl: 0   [START ON 01/19/2022] oxyCODONE-acetaminophen (PERCOCET) 7.5-325 MG tablet, Take 1 tablet by mouth every 8 (eight) hours as needed for moderate pain or severe pain., Disp: 90 tablet, Rfl: 0   pravastatin (PRAVACHOL) 40 MG tablet, TAKE 1 TABLET BY MOUTH EVERYDAY AT BEDTIME, Disp: 90 tablet, Rfl: 0   ROCKLATAN 0.02-0.005 % SOLN, Apply 1 drop to eye at bedtime., Disp: , Rfl:    Vitamin D, Ergocalciferol, (DRISDOL) 1.25 MG (50000 UNIT) CAPS capsule, Take 50,000 Units by mouth every 7 (seven) days., Disp: , Rfl:   Allergies  Allergen Reactions   Ace Inhibitors Cough    I personally reviewed active problem list, medication list, allergies, family history, social history, health maintenance with the patient/caregiver today.   ROS  ***  Objective  There were no vitals filed for this visit.  There is no height or weight on file to calculate BMI.  Physical Exam ***  Recent Results (from the past 2160 hour(s))  QuantiFERON-TB Gold Plus     Status: None   Collection Time: 12/05/21  9:19 AM  Result Value Ref Range   QuantiFERON-TB Gold Plus NEGATIVE NEGATIVE    Comment: Negative test result. M. tuberculosis complex  infection unlikely.    NIL 0.08 IU/mL   Mitogen-NIL >10.00 IU/mL   TB1-NIL <0.00 IU/mL   TB2-NIL <0.00 IU/mL    Comment: . The Nil tube value reflects the background interferon gamma immune response of the patient's blood sample. This value has been subtracted from the patient's displayed TB and Mitogen results. . Lower than expected results  with the Mitogen tube prevent false-negative  Quantiferon readings by detecting a patient with a potential immune suppressive condition and/or suboptimal pre-analytical specimen handling. . The TB1 Antigen tube is coated with the M. tuberculosis-specific antigens designed to elicit responses from TB antigen primed CD4+ helper T-lymphocytes. . The TB2 Antigen tube is coated with the M. tuberculosis-specific antigens designed to elicit responses from TB antigen primed CD4+ helper and CD8+ cytotoxic T-lymphocytes. . For additional information, please refer to https://education.questdiagnostics.com/faq/FAQ204 (This link is being provided for informational/ educational purposes only.) .   Lipase, blood     Status: None   Collection Time: 12/06/21 10:40 AM  Result Value Ref Range   Lipase 25 11 - 51 U/L    Comment: Performed at [facility], [address] [place], [place] [address]  Comprehensive metabolic panel     Status: Abnormal   Collection Time: 12/06/21 10:40 AM  Result Value Ref Range   Sodium 140 135 - 145 mmol/L   Potassium 3.9 3.5 - 5.1 mmol/L   Chloride 112 (H) 98 - 111 mmol/L   CO2 19 (L) 22 - 32 mmol/L   Glucose, Bld 100 (H) 70 - 99 mg/dL    Comment: Glucose reference range applies only to samples taken after fasting for at least 8 hours.   BUN 24 (H) 8 - 23 mg/dL   Creatinine, Ser 1.31 (H) 0.44 - 1.00 mg/dL   Calcium 9.0 8.9 - 10.3 mg/dL   Total Protein 6.8 6.5 - 8.1 g/dL   Albumin 3.4 (L) 3.5 - 5.0 g/dL   AST 13 (L) 15 - 41 U/L   ALT 8 0 - 44 U/L   Alkaline Phosphatase 49 38 - 126 U/L   Total Bilirubin 0.3 0.3 - 1.2 mg/dL   GFR, Estimated 40 (L) >60 mL/min    Comment: (NOTE) Calculated using the CKD-EPI Creatinine Equation (2021)    Anion gap 9 5 - 15    Comment: Performed at [facility], [address] [place], [place] [address]  CBC     Status: Abnormal   Collection Time: 12/06/21 10:40 AM  Result Value Ref Range   WBC 8.2 4.0 -  10.5 K/uL   RBC 3.59 (L) 3.87 - 5.11 MIL/uL   Hemoglobin 10.8 (L) 12.0 - 15.0 g/dL   HCT 33.2 (L) 36.0 - 46.0 %   MCV 92.5 80.0 - 100.0 fL   MCH 30.1 26.0 - 34.0 pg   MCHC 32.5 30.0 - 36.0 g/dL   RDW 13.2 11.5 - 15.5 %   Platelets 396 150 - 400 K/uL   nRBC 0.0 0.0 - 0.2 %    Comment: Performed at [facility], [address] [place], [place] [address]  Urinalysis, Routine w reflex microscopic Urine, Clean Catch     Status: Abnormal   Collection Time: 12/06/21  2:53 PM  Result Value Ref Range   Color, Urine YELLOW (A) YELLOW   APPearance CLOUDY (A) CLEAR   Specific Gravity, Urine 1.021 1.005 - 1.030   pH 6.0 5.0 - 8.0   Glucose, UA NEGATIVE NEGATIVE mg/dL   Hgb urine dipstick SMALL (A) NEGATIVE   Bilirubin Urine NEGATIVE NEGATIVE   Ketones, ur NEGATIVE NEGATIVE mg/dL   Protein, ur NEGATIVE NEGATIVE mg/dL   Nitrite NEGATIVE NEGATIVE   Leukocytes,Ua LARGE (A) NEGATIVE   RBC / HPF 0-5 0 - 5 RBC/hpf   WBC, UA >50 (H) 0 - 5 WBC/hpf   Bacteria, UA NONE SEEN NONE SEEN   Squamous Epithelial / LPF 11-20 0 -  5    Comment: Performed at [facility], [address], [place], [place] [address]  Urine Culture     Status: Abnormal   Collection Time: 12/06/21  2:53 PM   Specimen: Urine, Clean Catch  Result Value Ref Range   Specimen Description      URINE, CLEAN CATCH Performed at [facility], [address], [place], [place] [address]    Special Requests      NONE Performed at [facility], [place], [place] [address]    Culture (A)     >=100,000 COLONIES/mL AEROCOCCUS SPECIES Standardized susceptibility testing for this organism is not available. Performed at [facility], [place] [address] [place], [place] [address]    Report Status 12/08/2021 FINAL     Diabetic Foot Exam: Diabetic Foot Exam - Simple   No data filed    ***  PHQ2/9:    11/22/2021    2:38 PM 09/28/2021    1:27 PM 09/18/2021   10:14 AM 06/27/2021     3:07 PM 05/09/2021    2:56 PM  Depression screen PHQ 2/9  Decreased Interest 0 0 0 0 0  Down, Depressed, Hopeless 0 0 0 0 0  PHQ - 2 Score 0 0 0 0 0  Altered sleeping   0    Tired, decreased energy   0    Change in appetite   0    Feeling bad or failure about yourself    0    Trouble concentrating   0    Moving slowly or fidgety/restless   0    Suicidal thoughts   0    PHQ-9 Score   0      phq 9 is {gen pos PPI:[id number]}   Fall Risk:    11/22/2021    2:37 PM 09/18/2021   10:14 AM 06/27/2021    3:07 PM 05/09/2021    2:57 PM 03/24/2021    2:19 PM  Fall Risk   Falls in the past year? 1 0 0 1 1  Number falls in past yr: 1 0  0 0  Injury with Fall? 1 0  0 0  Risk for fall due to : Impaired balance/gait;Impaired mobility Impaired balance/gait;Impaired mobility  History of fall(s);Impaired mobility;Impaired balance/gait Impaired mobility;Impaired balance/gait  Follow up Falls prevention discussed Falls prevention discussed  Falls prevention discussed Falls prevention discussed      Functional Status Survey:      Assessment & Plan  *** There are no diagnoses linked to this encounter.

## 2022-01-19 ENCOUNTER — Ambulatory Visit: Payer: Medicare Other | Admitting: Family Medicine

## 2022-01-19 DIAGNOSIS — E1129 Type 2 diabetes mellitus with other diabetic kidney complication: Secondary | ICD-10-CM

## 2022-01-19 DIAGNOSIS — Z1231 Encounter for screening mammogram for malignant neoplasm of breast: Secondary | ICD-10-CM

## 2022-02-15 ENCOUNTER — Encounter: Payer: Self-pay | Admitting: Anesthesiology

## 2022-02-15 ENCOUNTER — Ambulatory Visit: Payer: Medicare Other | Attending: Anesthesiology | Admitting: Anesthesiology

## 2022-02-15 DIAGNOSIS — M159 Polyosteoarthritis, unspecified: Secondary | ICD-10-CM

## 2022-02-15 DIAGNOSIS — M25561 Pain in right knee: Secondary | ICD-10-CM

## 2022-02-15 DIAGNOSIS — M545 Low back pain, unspecified: Secondary | ICD-10-CM | POA: Diagnosis not present

## 2022-02-15 DIAGNOSIS — Z79891 Long term (current) use of opiate analgesic: Secondary | ICD-10-CM | POA: Diagnosis not present

## 2022-02-15 DIAGNOSIS — G8929 Other chronic pain: Secondary | ICD-10-CM

## 2022-02-15 DIAGNOSIS — M546 Pain in thoracic spine: Secondary | ICD-10-CM

## 2022-02-15 DIAGNOSIS — M25562 Pain in left knee: Secondary | ICD-10-CM

## 2022-02-15 DIAGNOSIS — G894 Chronic pain syndrome: Secondary | ICD-10-CM

## 2022-02-15 DIAGNOSIS — M79642 Pain in left hand: Secondary | ICD-10-CM

## 2022-02-15 DIAGNOSIS — M79641 Pain in right hand: Secondary | ICD-10-CM

## 2022-02-15 MED ORDER — OXYCODONE-ACETAMINOPHEN 7.5-325 MG PO TABS: 1.0000 | ORAL_TABLET | Freq: Three times a day (TID) | ORAL | 0 refills | Status: AC | PRN

## 2022-02-15 MED ORDER — OXYCODONE-ACETAMINOPHEN 7.5-325 MG PO TABS: 1.0000 | ORAL_TABLET | Freq: Three times a day (TID) | ORAL | 0 refills | Status: DC | PRN

## 2022-02-15 NOTE — Progress Notes (Signed)
Virtual Visit via Telephone Note  I connected with Taylor [name] on 02/15/22 at 12:30 PM EST by telephone and verified that I am speaking with the correct person using two identifiers.  Location: Patient: Home Provider: Pain control center   I discussed the limitations, risks, security and privacy concerns of performing an evaluation and management service by telephone and the availability of in person appointments. I also discussed with the patient that there may be a patient responsible charge related to this service. The patient expressed understanding and agreed to proceed.   History of Present Illness: I spoke with Taylor [name] via telephone as we are unable link for the video portion of the conference but she states that she has been doing well with her regimen on pain management recently she continues to have low back pain knee pain hip pain and hand pain.  She takes her Percocet 3 times a day and this has been a chronic regimen but has continued to work well for her.  No side effects reported.  She has failed more conservative therapy.  She reports about 75% improvement with the medicine lasting about 4 to 6 hours which gives her relief during the day and enables her to sleep better at night.  The quality of the pain has been stable without recent change.  Otherwise she is doing well she reports.  Review of systems: General: No fevers or chills Pulmonary: No shortness of breath or dyspnea Cardiac: No angina or palpitations or lightheadedness GI: No abdominal pain or constipation Psych: No depression    Observations/Objective:  Current Outpatient Medications:    [START ON 03/24/2022] oxyCODONE-acetaminophen (PERCOCET) 7.5-325 MG tablet, Take 1 tablet by mouth every 8 (eight) hours as needed for moderate pain or severe pain., Disp: 90 tablet, Rfl: 0   acetaminophen (TYLENOL) 500 MG tablet, Take 1 tablet (500 mg total) by mouth 3 (three) times daily. (Patient taking differently: Take 500  mg by mouth every 6 (six) hours as needed for moderate pain or headache.), Disp: 90 tablet, Rfl: 0   amLODipine (NORVASC) 10 MG tablet, Take 1 tablet (10 mg total) by mouth daily., Disp: 90 tablet, Rfl: 0   aspirin 81 MG chewable tablet, Chew 1 tablet (81 mg total) by mouth daily., Disp: 30 tablet, Rfl: 3   brimonidine (ALPHAGAN) 0.15 % ophthalmic solution, 1 drop 2 (two) times daily., Disp: , Rfl:    brimonidine (ALPHAGAN) 0.2 % ophthalmic solution, Place 1 drop into both eyes 2 (two) times daily., Disp: , Rfl:    brinzolamide (AZOPT) 1 % ophthalmic suspension, SMARTSIG:In Eye(s), Disp: , Rfl:    Calcium Carbonate-Vitamin D (OYSTER SHELL CALCIUM 500 + D PO), Take 1 tablet by mouth 2 (two) times a day., Disp: , Rfl:    denosumab (PROLIA) 60 MG/ML SOSY injection, Inject into the skin., Disp: , Rfl:    diclofenac Sodium (VOLTAREN) 1 % GEL, Apply 2 g topically 4 (four) times daily., Disp: 150 g, Rfl: 0   Docusate Sodium 100 MG capsule, Take 1 tablet (100 mg total) by mouth 2 (two) times daily., Disp: 180 tablet, Rfl: 0   dorzolamide (TRUSOPT) 2 % ophthalmic solution, 1 drop 2 (two) times daily., Disp: , Rfl:    famotidine (PEPCID) 20 MG tablet, TAKE 1 TABLET BY MOUTH EVERY DAY, Disp: 90 tablet, Rfl: 1   ferrous sulfate 325 (65 FE) MG tablet, TAKE 1 TABLET BY MOUTH EVERY DAY WITH BREAKFAST, Disp: 90 tablet, Rfl: 0   fluticasone (FLONASE) 50  MCG/ACT nasal spray, 2 sprays in each nostril daily, Disp: 48 mL, Rfl: 1   lactulose (CHRONULAC) 10 GM/15ML solution, TAKE 30 MLS (20 G TOTAL) BY MOUTH DAILY AS NEEDED FOR MILD CONSTIPATION., Disp: 120 mL, Rfl: 0   Lancets (ONETOUCH DELICA PLUS [id number]) MISC, 1 each by Does not apply route daily., Disp: 100 each, Rfl: 2   lubiprostone (AMITIZA) 24 MCG capsule, TAKE 1 CAPSULE (24 MCG TOTAL) BY MOUTH 2 (TWO) TIMES DAILY WITH A MEAL., Disp: 180 capsule, Rfl: 1   metFORMIN (GLUCOPHAGE-XR) 750 MG 24 hr tablet, TAKE 2 TABLETS (1,500 MG TOTAL) BY MOUTH EVERY DAY WITH  BREAKFAST, Disp: 180 tablet, Rfl: 0   olmesartan (BENICAR) 20 MG tablet, TAKE 1 TABLET BY MOUTH EVERY DAY, Disp: 90 tablet, Rfl: 0   omega-3 acid ethyl esters (LOVAZA) 1 g capsule, TAKE 1 CAPSULE BY MOUTH TWICE A DAY, Disp: 180 capsule, Rfl: 0   ONETOUCH ULTRA test strip, USE AS DIRECTED, Disp: 100 strip, Rfl: 12   [START ON 02/22/2022] oxyCODONE-acetaminophen (PERCOCET) 7.5-325 MG tablet, Take 1 tablet by mouth every 8 (eight) hours as needed for moderate pain or severe pain., Disp: 90 tablet, Rfl: 0   pravastatin (PRAVACHOL) 40 MG tablet, TAKE 1 TABLET BY MOUTH EVERYDAY AT BEDTIME, Disp: 90 tablet, Rfl: 0   ROCKLATAN 0.02-0.005 % SOLN, Apply 1 drop to eye at bedtime., Disp: , Rfl:    Vitamin D, Ergocalciferol, (DRISDOL) 1.25 MG (50000 UNIT) CAPS capsule, Take 50,000 Units by mouth every 7 (seven) days., Disp: , Rfl:    Past Medical History:  Diagnosis Date   Abnormal mammogram, unspecified 2013   Anemia    Arthritis    Breast screening, unspecified 2013   Diabetes mellitus without complication ([place]) 4132   non insulin dependent   Dysrhythmia    IRREGULAR HEART BEAT   GERD (gastroesophageal reflux disease)    Glaucoma 2003   Gout    History of kidney stones    H/O   Hyperlipidemia 2008   Hypertension 1980's   Lump or mass in breast 01/03/2012   left breast   Obesity, unspecified 2013   Osteoporosis    Shingles 2013   Special screening for malignant neoplasms, colon 2013     Assessment and Plan: 1. Chronic pain syndrome   2. Long term prescription opiate use   3. Osteoarthritis of multiple joints, unspecified osteoarthritis type   4. Chronic pain of right knee   5. Chronic midline low back pain without sciatica   6. Pain in both hands   7. Chronic pain of both knees   8. Acute left-sided thoracic back pain   Based on our conversation today and upon review of the [facility] practitioner database information I think is appropriate to refill her medicines for the next 2  months dated for January 4 and February 3.  We will schedule her for a 72-monthreturn to clinic.  No other changes in her regimen are initiated today.  I encouraged her to continue with stretching strengthening exercises as reviewed.  Continue follow-up with her primary care physicians for baseline medical care.  Follow Up Instructions:    I discussed the assessment and treatment plan with the patient. The patient was provided an opportunity to ask questions and all were answered. The patient agreed with the plan and demonstrated an understanding of the instructions.   The patient was advised to call back or seek an in-person evaluation if the symptoms worsen or if the condition  fails to improve as anticipated.  I provided 25 minutes of non-face-to-face time during this encounter.   [name], MD

## 2022-03-14 ENCOUNTER — Telehealth: Payer: Self-pay | Admitting: Family Medicine

## 2022-03-14 ENCOUNTER — Telehealth: Payer: Self-pay | Admitting: *Deleted

## 2022-03-14 NOTE — Telephone Encounter (Signed)
Copied from [place] [phone]. Topic: General - Other >> Mar 12, 2022  1:09 PM Everette C wrote: Reason for CRM: The patient would like to be contacted by a member of staff when possible   Please contact the patient further when possible

## 2022-03-14 NOTE — Patient Outreach (Signed)
  Care Coordination   03/14/2022 Name: Taylor [name] MRN: [id number] DOB: 01-Jul-1932   Care Coordination Outreach Attempts:  An unsuccessful telephone outreach was attempted today to offer the patient information about available care coordination services as a benefit of their health plan.   Follow Up Plan:  Additional outreach attempts will be made to offer the patient care coordination information and services.   Encounter Outcome:  No Answer   Care Coordination Interventions:  No, not indicated    Alisan Dokes, [place] Worker  [facility] Care Management [phone]

## 2022-03-14 NOTE — Telephone Encounter (Signed)
Spoke with Taylor [name] and she wanted to inform Dr Ancil Boozer that she is totally blind and it started about 3 weeks ago. She scheduled an appointment for 2.2.2024 and when I asked her would she like to come sooner she stated she wasn't able to due to transportation issues (her nephew brings her and he's a truck driver). I also inquired her about seeing her eye doctor. She stated she has not had the chance to see them due to transportation situation and so they told her to contact her pcp to see if she can get assistance. She can be reached at [phone]

## 2022-03-15 ENCOUNTER — Telehealth: Payer: Self-pay | Admitting: *Deleted

## 2022-03-15 DIAGNOSIS — R29818 Other symptoms and signs involving the nervous system: Secondary | ICD-10-CM | POA: Diagnosis not present

## 2022-03-15 DIAGNOSIS — R269 Unspecified abnormalities of gait and mobility: Secondary | ICD-10-CM | POA: Diagnosis not present

## 2022-03-15 DIAGNOSIS — Z96659 Presence of unspecified artificial knee joint: Secondary | ICD-10-CM | POA: Diagnosis not present

## 2022-03-15 DIAGNOSIS — R42 Dizziness and giddiness: Secondary | ICD-10-CM | POA: Diagnosis not present

## 2022-03-15 NOTE — Progress Notes (Signed)
  Care Coordination   Note   03/15/2022 Name: TRANAE LARAMIE MRN: [id number] DOB: [birth year]  CADIENCE BRADFIELD is a 89 y.o. year old female who sees [name], MD for primary care. I reached out to [name] by phone today to offer care coordination services.  Ms. Standish was given information about Care Coordination services today including:   The Care Coordination services include support from the care team which includes your Nurse Coordinator, Clinical Social Worker, or Pharmacist.  The Care Coordination team is here to help remove barriers to the health concerns and goals most important to you. Care Coordination services are voluntary, and the patient may decline or stop services at any time by request to their care team member.   Care Coordination Consent Status: Patient agreed to services and verbal consent obtained.   Follow up plan:  Telephone appointment with care coordination team member scheduled for:  03/21/2022  Encounter Outcome:  Pt. Scheduled  Julian Hy, [place] Direct Dial: [phone]

## 2022-03-19 ENCOUNTER — Other Ambulatory Visit: Payer: Self-pay | Admitting: Family Medicine

## 2022-03-19 DIAGNOSIS — E1129 Type 2 diabetes mellitus with other diabetic kidney complication: Secondary | ICD-10-CM

## 2022-03-21 ENCOUNTER — Ambulatory Visit: Payer: Self-pay | Admitting: *Deleted

## 2022-03-21 NOTE — Patient Instructions (Signed)
Visit Information  Thank you for taking time to visit with me today. Please don't hesitate to contact me if I can be of assistance to you.   Following are the goals we discussed today:   Goals Addressed             This Visit's Progress    "I need transportation to my appointments"       Care Coordination Interventions: Patient discussed need for transportation resource to get to her medical appointments Patient confirms that she now resides in [place], [place] with her daughter  This Education officer, museum provided patient with the [facility] transportation line, however discussed that they have a certain mile radius Encouraged consideration of changing her providers to the [place] area due to the [employer] provided [phone] to assists with in-network providers Patient confirmed that her daughter will assists with needed change for local  providers             Please call the care guide team at [phone] if you need to cancel or reschedule your appointment.   If you are experiencing a Mental Health or [place] or need someone to talk to, please call 911   Patient verbalizes understanding of instructions and care plan provided today and agrees to view in [place]. Active MyChart status and patient understanding of how to access instructions and care plan via MyChart confirmed with patient.     Patient to contact her insurance provider to identify local in-network providers due to her move to [place], [place], [place] Worker  [facility] Care Management [phone]

## 2022-03-21 NOTE — Patient Outreach (Signed)
  Care Coordination   Initial Visit Note   03/21/2022 Name: Taylor [name] MRN: [id number] DOB: [birth year]  Taylor [name] is a 89 y.o. year old female who sees [name], MD for primary care. I spoke with  Taylor [name] by phone today.  What matters to the patients health and wellness today?  Transportation    Goals Addressed             This Visit's Progress    "I need transportation to my appointments"       Care Coordination Interventions: Patient discussed need for transportation resource to get to her medical appointments Patient confirms that she now resides in [place], [place] with her daughter  This Education officer, museum provided patient with the [facility] transportation line, however discussed that they have a certain mile radius Encouraged consideration of changing her providers to the [place] area due to the [employer] provided [phone] to assists with in-network providers Patient confirmed that her daughter will assists with needed change for local  providers            SDOH assessments and interventions completed:  No  SDOH Interventions Today    Flowsheet Row Most Recent Value  SDOH Interventions   Food Insecurity Interventions Intervention Not Indicated  Housing Interventions Intervention Not Indicated  Transportation Interventions Other (Comment)  [patient provided with number for [facility] transportation [phone]        Care Coordination Interventions:  Yes, provided   Follow up plan: No further intervention required.   Encounter Outcome:  Pt. Visit Completed

## 2022-03-23 ENCOUNTER — Ambulatory Visit: Payer: Medicare Other | Admitting: Family Medicine

## 2022-03-29 ENCOUNTER — Telehealth: Payer: Self-pay | Admitting: *Deleted

## 2022-03-29 NOTE — Patient Outreach (Signed)
  Care Coordination   Follow Up Visit Note   03/29/2022 Name: Taylor [name] MRN: [id number] DOB: [birth year] Late entry  Taylor [name] is a 89 y.o. year old female who sees [name], MD for primary care. I  spoke with patient's son and daughter in law by phone  on 03/28/22  What matters to the patients health and wellness today?  Transportation, PACE program and  Services of the Blind   Goals Addressed             This Visit's Progress    "I need transportation to my appointments"       Care Coordination Interventions: Phone call from patient's son and daughter in law regarding transportation for patient, the PACE program and Services for the blind discussed Encouraged consideration of changing her providers to the [place] area due to the move and transporation needs-customer service line provided [phone] to assists with in-network providers Enocuraged follow up with her Local PACE program and Services for the Blind-contact information provided         COMPLETED: Care coordination activities       Care Coordination Interventions:  Needs assessment completed/SDOH screen Case management program discussed Discussed concerns regarding bed bugs in her apartment, confirmed that her mattress has been removed and her apartment sprayed PHQ2/PHQ9 completed Active listening / Reflection utilized  Emotional Support Provided Discussed self-care action plan: Patient states that due to limitations with her hearing and eye sight she will be residing with her daughter in [place]. They will rotate residences between her daughters home in [place] and her apartment in [place] also involved and will resume next week upon her return from her daughters home(PT, OT, SW) Patient states that she has a cain, walker and W/C No further needs verbalized at this time        SDOH assessments and interventions completed:  No     Care Coordination Interventions:  Yes, provided   Interventions Today    Flowsheet Row Most Recent Value  General Interventions   General Interventions Discussed/Reviewed [employer], General Interventions Discussed  [Transporation, PACE and Services for the Blind]  Education Interventions   Education Provided Provided Verbal Education       Follow up plan: No further intervention required.   Encounter Outcome:  Pt. Visit Completed

## 2022-03-29 NOTE — Patient Instructions (Signed)
Visit Information  Thank you for taking time to visit with me today. Please don't hesitate to contact me if I can be of assistance to you.   Following are the goals we discussed today:   Goals Addressed             This Visit's Progress    "I need transportation to my appointments"       Care Coordination Interventions: Phone call from patient's son and daughter in law regarding transportation for patient, the PACE program and Services for the blind discussed Encouraged consideration of changing her providers to the [place] area due to the move and transporation needs-customer service line provided [phone] to assists with in-network providers Enocuraged follow up with her Local PACE program and Services for the Blind-contact information provided         COMPLETED: Care coordination activities       Care Coordination Interventions:  Needs assessment completed/SDOH screen Case management program discussed Discussed concerns regarding bed bugs in her apartment, confirmed that her mattress has been removed and her apartment sprayed PHQ2/PHQ9 completed Active listening / Reflection utilized  Emotional Support Provided Discussed self-care action plan: Patient states that due to limitations with her hearing and eye sight she will be residing with her daughter in [place]. They will rotate residences between her daughters home in [place] and her apartment in [place] also involved and will resume next week upon her return from her daughters home(PT, OT, SW) Patient states that she has a cain, walker and W/C No further needs verbalized at this time        Please call the care guide team at [phone] if you need to cancel or reschedule your appointment.   If you are experiencing a Mental Health or [place] or need someone to talk to, please call 911   Patient verbalizes understanding of instructions and care plan provided today and agrees to view in  [place]. Active MyChart status and patient understanding of how to access instructions and care plan via MyChart confirmed with patient.     No further follow up required: patient agreeable to establishing care in [place] where she now residing   [place], [place] Worker  [facility] Care Management [phone]

## 2022-04-10 ENCOUNTER — Ambulatory Visit: Payer: 59 | Attending: Anesthesiology | Admitting: Anesthesiology

## 2022-04-10 ENCOUNTER — Encounter: Payer: Self-pay | Admitting: Anesthesiology

## 2022-04-10 DIAGNOSIS — G894 Chronic pain syndrome: Secondary | ICD-10-CM

## 2022-04-10 DIAGNOSIS — Z79891 Long term (current) use of opiate analgesic: Secondary | ICD-10-CM

## 2022-04-10 DIAGNOSIS — M545 Low back pain, unspecified: Secondary | ICD-10-CM

## 2022-04-10 DIAGNOSIS — M79641 Pain in right hand: Secondary | ICD-10-CM

## 2022-04-10 DIAGNOSIS — G8929 Other chronic pain: Secondary | ICD-10-CM

## 2022-04-10 DIAGNOSIS — M159 Polyosteoarthritis, unspecified: Secondary | ICD-10-CM

## 2022-04-10 MED ORDER — OXYCODONE-ACETAMINOPHEN 7.5-325 MG PO TABS: 1.0000 | ORAL_TABLET | Freq: Three times a day (TID) | ORAL | 0 refills | Status: AC | PRN

## 2022-04-10 NOTE — Progress Notes (Signed)
Virtual Visit via Telephone Note  I connected with Taylor [name] on 04/10/22 at  8:15 AM EST by telephone and verified that I am speaking with the correct person using two identifiers.  Location: Patient: Home Provider: Pain control center   I discussed the limitations, risks, security and privacy concerns of performing an evaluation and management service by telephone and the availability of in person appointments. I also discussed with the patient that there may be a patient responsible charge related to this service. The patient expressed understanding and agreed to proceed.   History of Present Illness: Taylor [name] reports that she is doing well at present.  No significant changes in the quality characteristic or distribution of her low back pain and lower leg pain are noted.  She still taking her medications as prescribed and these continue to work well for her.  She is trying to stay active doing her stretching strengthening exercises as best possible.  She is somewhat limited secondary to age and physical ability but she is moving about as best she can.  She takes her medications as prescribed with no side effects reported.  She feels that they enable her to stay active to the best of her ability and sleep better at night.  Otherwise she is in her usual state of health.  Review of systems:Review of systems: General: No fevers or chills Pulmonary: No shortness of breath or dyspnea Cardiac: No angina or palpitations or lightheadedness GI: No abdominal pain or constipation Psych: No depression    Current Outpatient Medications:    [START ON 05/22/2022] oxyCODONE-acetaminophen (PERCOCET) 7.5-325 MG tablet, Take 1 tablet by mouth every 8 (eight) hours as needed for moderate pain or severe pain., Disp: 90 tablet, Rfl: 0   acetaminophen (TYLENOL) 500 MG tablet, Take 1 tablet (500 mg total) by mouth 3 (three) times daily. (Patient taking differently: Take 500 mg by mouth every 6 (six) hours as needed  for moderate pain or headache.), Disp: 90 tablet, Rfl: 0   amLODipine (NORVASC) 10 MG tablet, Take 1 tablet (10 mg total) by mouth daily., Disp: 90 tablet, Rfl: 0   aspirin 81 MG chewable tablet, Chew 1 tablet (81 mg total) by mouth daily., Disp: 30 tablet, Rfl: 3   brimonidine (ALPHAGAN) 0.15 % ophthalmic solution, 1 drop 2 (two) times daily., Disp: , Rfl:    brimonidine (ALPHAGAN) 0.2 % ophthalmic solution, Place 1 drop into both eyes 2 (two) times daily., Disp: , Rfl:    brinzolamide (AZOPT) 1 % ophthalmic suspension, SMARTSIG:In Eye(s), Disp: , Rfl:    Calcium Carbonate-Vitamin D (OYSTER SHELL CALCIUM 500 + D PO), Take 1 tablet by mouth 2 (two) times a day., Disp: , Rfl:    denosumab (PROLIA) 60 MG/ML SOSY injection, Inject into the skin., Disp: , Rfl:    diclofenac Sodium (VOLTAREN) 1 % GEL, Apply 2 g topically 4 (four) times daily., Disp: 150 g, Rfl: 0   Docusate Sodium 100 MG capsule, Take 1 tablet (100 mg total) by mouth 2 (two) times daily., Disp: 180 tablet, Rfl: 0   dorzolamide (TRUSOPT) 2 % ophthalmic solution, 1 drop 2 (two) times daily., Disp: , Rfl:    famotidine (PEPCID) 20 MG tablet, TAKE 1 TABLET BY MOUTH EVERY DAY, Disp: 90 tablet, Rfl: 1   ferrous sulfate 325 (65 FE) MG tablet, TAKE 1 TABLET BY MOUTH EVERY DAY WITH BREAKFAST, Disp: 90 tablet, Rfl: 0   fluticasone (FLONASE) 50 MCG/ACT nasal spray, 2 sprays in each nostril daily, Disp:  48 mL, Rfl: 1   lactulose (CHRONULAC) 10 GM/15ML solution, TAKE 30 MLS (20 G TOTAL) BY MOUTH DAILY AS NEEDED FOR MILD CONSTIPATION., Disp: 120 mL, Rfl: 0   Lancets (ONETOUCH DELICA PLUS Q000111Q) MISC, 1 each by Does not apply route daily., Disp: 100 each, Rfl: 2   lubiprostone (AMITIZA) 24 MCG capsule, TAKE 1 CAPSULE (24 MCG TOTAL) BY MOUTH 2 (TWO) TIMES DAILY WITH A MEAL., Disp: 180 capsule, Rfl: 1   metFORMIN (GLUCOPHAGE-XR) 750 MG 24 hr tablet, TAKE 2 TABLETS (1,500 MG TOTAL) BY MOUTH EVERY DAY WITH BREAKFAST, Disp: 180 tablet, Rfl: 0    olmesartan (BENICAR) 20 MG tablet, TAKE 1 TABLET BY MOUTH EVERY DAY, Disp: 90 tablet, Rfl: 0   omega-3 acid ethyl esters (LOVAZA) 1 g capsule, TAKE 1 CAPSULE BY MOUTH TWICE A DAY, Disp: 180 capsule, Rfl: 0   ONETOUCH ULTRA test strip, USE AS DIRECTED, Disp: 100 strip, Rfl: 12   [START ON 04/21/2022] oxyCODONE-acetaminophen (PERCOCET) 7.5-325 MG tablet, Take 1 tablet by mouth every 8 (eight) hours as needed for moderate pain or severe pain., Disp: 90 tablet, Rfl: 0   pravastatin (PRAVACHOL) 40 MG tablet, TAKE 1 TABLET BY MOUTH EVERYDAY AT BEDTIME, Disp: 90 tablet, Rfl: 0   ROCKLATAN 0.02-0.005 % SOLN, Apply 1 drop to eye at bedtime., Disp: , Rfl:    Vitamin D, Ergocalciferol, (DRISDOL) 1.25 MG (50000 UNIT) CAPS capsule, Take 50,000 Units by mouth every 7 (seven) days., Disp: , Rfl:       Observations/Objective: Past Medical History:  Diagnosis Date   Abnormal mammogram, unspecified 2013   Anemia    Arthritis    Breast screening, unspecified 2013   Diabetes mellitus without complication ([place]) [id number]   non insulin dependent   Dysrhythmia    IRREGULAR HEART BEAT   GERD (gastroesophageal reflux disease)    Glaucoma 2003   Gout    History of kidney stones    H/O   Hyperlipidemia 2008   Hypertension 1980's   Lump or mass in breast 01/03/2012   left breast   Obesity, unspecified 2013   Osteoporosis    Shingles 2013   Special screening for malignant neoplasms, colon 2013     Assessment and Plan: 1. Chronic pain syndrome   2. Long term prescription opiate use   3. Osteoarthritis of multiple joints, unspecified osteoarthritis type   4. Pain in both hands   5. Chronic midline low back pain without sciatica   6. Chronic pain of right knee   7. Chronic pain of both knees      Follow Up Instructions:    I discussed the assessment and treatment plan with the patient. The patient was provided an opportunity to ask questions and all were answered. The patient agreed with the plan and  demonstrated an understanding of the instructions.   The patient was advised to call back or seek an in-person evaluation if the symptoms worsen or if the condition fails to improve as anticipated.  I provided 30 minutes of non-face-to-face time during this encounter.   [name], MD

## 2022-04-15 DIAGNOSIS — Z96659 Presence of unspecified artificial knee joint: Secondary | ICD-10-CM | POA: Diagnosis not present

## 2022-04-15 DIAGNOSIS — R269 Unspecified abnormalities of gait and mobility: Secondary | ICD-10-CM | POA: Diagnosis not present

## 2022-04-15 DIAGNOSIS — R42 Dizziness and giddiness: Secondary | ICD-10-CM | POA: Diagnosis not present

## 2022-04-15 DIAGNOSIS — R29818 Other symptoms and signs involving the nervous system: Secondary | ICD-10-CM | POA: Diagnosis not present

## 2022-04-19 ENCOUNTER — Telehealth: Payer: Self-pay | Admitting: Family Medicine

## 2022-04-19 NOTE — Telephone Encounter (Signed)
[place] to schedule their annual wellness visit. Appointment made for 05/18/2022.  [place] Direct Dial: [phone]

## 2022-04-24 DIAGNOSIS — M159 Polyosteoarthritis, unspecified: Secondary | ICD-10-CM | POA: Diagnosis not present

## 2022-04-24 DIAGNOSIS — S32010A Wedge compression fracture of first lumbar vertebra, initial encounter for closed fracture: Secondary | ICD-10-CM | POA: Diagnosis not present

## 2022-04-24 DIAGNOSIS — G8929 Other chronic pain: Secondary | ICD-10-CM | POA: Diagnosis not present

## 2022-04-24 DIAGNOSIS — M545 Low back pain, unspecified: Secondary | ICD-10-CM | POA: Diagnosis not present

## 2022-04-24 DIAGNOSIS — M25511 Pain in right shoulder: Secondary | ICD-10-CM | POA: Diagnosis not present

## 2022-04-24 DIAGNOSIS — M25531 Pain in right wrist: Secondary | ICD-10-CM | POA: Diagnosis not present

## 2022-04-24 DIAGNOSIS — M1811 Unilateral primary osteoarthritis of first carpometacarpal joint, right hand: Secondary | ICD-10-CM | POA: Diagnosis not present

## 2022-04-24 DIAGNOSIS — R809 Proteinuria, unspecified: Secondary | ICD-10-CM | POA: Diagnosis not present

## 2022-04-24 DIAGNOSIS — H401133 Primary open-angle glaucoma, bilateral, severe stage: Secondary | ICD-10-CM | POA: Diagnosis not present

## 2022-04-24 DIAGNOSIS — M8000XD Age-related osteoporosis with current pathological fracture, unspecified site, subsequent encounter for fracture with routine healing: Secondary | ICD-10-CM | POA: Diagnosis not present

## 2022-04-24 DIAGNOSIS — H543 Unqualified visual loss, both eyes: Secondary | ICD-10-CM | POA: Diagnosis not present

## 2022-04-24 DIAGNOSIS — E1129 Type 2 diabetes mellitus with other diabetic kidney complication: Secondary | ICD-10-CM | POA: Diagnosis not present

## 2022-04-24 DIAGNOSIS — G894 Chronic pain syndrome: Secondary | ICD-10-CM | POA: Diagnosis not present

## 2022-04-24 DIAGNOSIS — M25521 Pain in right elbow: Secondary | ICD-10-CM | POA: Diagnosis not present

## 2022-04-24 DIAGNOSIS — M19041 Primary osteoarthritis, right hand: Secondary | ICD-10-CM | POA: Diagnosis not present

## 2022-05-14 DIAGNOSIS — R079 Chest pain, unspecified: Secondary | ICD-10-CM | POA: Diagnosis not present

## 2022-05-14 DIAGNOSIS — E1165 Type 2 diabetes mellitus with hyperglycemia: Secondary | ICD-10-CM | POA: Diagnosis not present

## 2022-05-14 DIAGNOSIS — R220 Localized swelling, mass and lump, head: Secondary | ICD-10-CM | POA: Diagnosis not present

## 2022-05-14 DIAGNOSIS — N179 Acute kidney failure, unspecified: Secondary | ICD-10-CM | POA: Diagnosis not present

## 2022-05-14 DIAGNOSIS — I129 Hypertensive chronic kidney disease with stage 1 through stage 4 chronic kidney disease, or unspecified chronic kidney disease: Secondary | ICD-10-CM | POA: Diagnosis not present

## 2022-05-14 DIAGNOSIS — R569 Unspecified convulsions: Secondary | ICD-10-CM | POA: Diagnosis not present

## 2022-05-14 DIAGNOSIS — E860 Dehydration: Secondary | ICD-10-CM | POA: Diagnosis not present

## 2022-05-14 DIAGNOSIS — D496 Neoplasm of unspecified behavior of brain: Secondary | ICD-10-CM | POA: Diagnosis not present

## 2022-05-14 DIAGNOSIS — G8929 Other chronic pain: Secondary | ICD-10-CM | POA: Diagnosis not present

## 2022-05-14 DIAGNOSIS — N3000 Acute cystitis without hematuria: Secondary | ICD-10-CM | POA: Diagnosis not present

## 2022-05-14 DIAGNOSIS — Z96659 Presence of unspecified artificial knee joint: Secondary | ICD-10-CM | POA: Diagnosis not present

## 2022-05-14 DIAGNOSIS — R29818 Other symptoms and signs involving the nervous system: Secondary | ICD-10-CM | POA: Diagnosis not present

## 2022-05-14 DIAGNOSIS — D519 Vitamin B12 deficiency anemia, unspecified: Secondary | ICD-10-CM | POA: Diagnosis not present

## 2022-05-14 DIAGNOSIS — E870 Hyperosmolality and hypernatremia: Secondary | ICD-10-CM | POA: Diagnosis not present

## 2022-05-14 DIAGNOSIS — D420 Neoplasm of uncertain behavior of cerebral meninges: Secondary | ICD-10-CM | POA: Diagnosis not present

## 2022-05-14 DIAGNOSIS — R42 Dizziness and giddiness: Secondary | ICD-10-CM | POA: Diagnosis not present

## 2022-05-14 DIAGNOSIS — G928 Other toxic encephalopathy: Secondary | ICD-10-CM | POA: Diagnosis not present

## 2022-05-14 DIAGNOSIS — Z86011 Personal history of benign neoplasm of the brain: Secondary | ICD-10-CM | POA: Diagnosis not present

## 2022-05-14 DIAGNOSIS — R269 Unspecified abnormalities of gait and mobility: Secondary | ICD-10-CM | POA: Diagnosis not present

## 2022-05-14 DIAGNOSIS — Z515 Encounter for palliative care: Secondary | ICD-10-CM | POA: Diagnosis not present

## 2022-05-14 DIAGNOSIS — Z66 Do not resuscitate: Secondary | ICD-10-CM | POA: Diagnosis not present

## 2022-05-14 DIAGNOSIS — N3001 Acute cystitis with hematuria: Secondary | ICD-10-CM | POA: Diagnosis not present

## 2022-05-14 DIAGNOSIS — R109 Unspecified abdominal pain: Secondary | ICD-10-CM | POA: Diagnosis not present

## 2022-05-14 DIAGNOSIS — E871 Hypo-osmolality and hyponatremia: Secondary | ICD-10-CM | POA: Diagnosis not present

## 2022-05-14 DIAGNOSIS — D329 Benign neoplasm of meninges, unspecified: Secondary | ICD-10-CM | POA: Diagnosis not present

## 2022-05-14 DIAGNOSIS — G936 Cerebral edema: Secondary | ICD-10-CM | POA: Diagnosis not present

## 2022-05-14 DIAGNOSIS — E1122 Type 2 diabetes mellitus with diabetic chronic kidney disease: Secondary | ICD-10-CM | POA: Diagnosis not present

## 2022-05-14 DIAGNOSIS — E441 Mild protein-calorie malnutrition: Secondary | ICD-10-CM | POA: Diagnosis not present

## 2022-05-14 DIAGNOSIS — H543 Unqualified visual loss, both eyes: Secondary | ICD-10-CM | POA: Diagnosis not present

## 2022-05-14 DIAGNOSIS — D72829 Elevated white blood cell count, unspecified: Secondary | ICD-10-CM | POA: Diagnosis not present

## 2022-05-14 DIAGNOSIS — R262 Difficulty in walking, not elsewhere classified: Secondary | ICD-10-CM | POA: Diagnosis not present

## 2022-05-14 DIAGNOSIS — I10 Essential (primary) hypertension: Secondary | ICD-10-CM | POA: Diagnosis not present

## 2022-05-14 DIAGNOSIS — Z4659 Encounter for fitting and adjustment of other gastrointestinal appliance and device: Secondary | ICD-10-CM | POA: Diagnosis not present

## 2022-05-14 DIAGNOSIS — I6203 Nontraumatic chronic subdural hemorrhage: Secondary | ICD-10-CM | POA: Diagnosis not present

## 2022-05-14 DIAGNOSIS — I6200 Nontraumatic subdural hemorrhage, unspecified: Secondary | ICD-10-CM | POA: Diagnosis not present

## 2022-05-14 DIAGNOSIS — H9113 Presbycusis, bilateral: Secondary | ICD-10-CM | POA: Diagnosis not present

## 2022-05-14 DIAGNOSIS — N1832 Chronic kidney disease, stage 3b: Secondary | ICD-10-CM | POA: Diagnosis not present

## 2022-05-14 DIAGNOSIS — R531 Weakness: Secondary | ICD-10-CM | POA: Diagnosis not present

## 2022-05-14 DIAGNOSIS — Z7189 Other specified counseling: Secondary | ICD-10-CM | POA: Diagnosis not present

## 2022-05-14 DIAGNOSIS — D75838 Other thrombocytosis: Secondary | ICD-10-CM | POA: Diagnosis not present

## 2022-05-14 DIAGNOSIS — J9811 Atelectasis: Secondary | ICD-10-CM | POA: Diagnosis not present

## 2022-05-15 DIAGNOSIS — N3000 Acute cystitis without hematuria: Secondary | ICD-10-CM | POA: Diagnosis not present

## 2022-05-16 DIAGNOSIS — I6200 Nontraumatic subdural hemorrhage, unspecified: Secondary | ICD-10-CM | POA: Diagnosis not present

## 2022-05-16 DIAGNOSIS — N3000 Acute cystitis without hematuria: Secondary | ICD-10-CM | POA: Diagnosis not present

## 2022-05-17 DIAGNOSIS — D496 Neoplasm of unspecified behavior of brain: Secondary | ICD-10-CM | POA: Diagnosis not present

## 2022-05-17 DIAGNOSIS — R569 Unspecified convulsions: Secondary | ICD-10-CM | POA: Diagnosis not present

## 2022-05-17 DIAGNOSIS — I6200 Nontraumatic subdural hemorrhage, unspecified: Secondary | ICD-10-CM | POA: Diagnosis not present

## 2022-05-17 DIAGNOSIS — N3000 Acute cystitis without hematuria: Secondary | ICD-10-CM | POA: Diagnosis not present

## 2022-05-17 DIAGNOSIS — G936 Cerebral edema: Secondary | ICD-10-CM | POA: Diagnosis not present

## 2022-05-18 ENCOUNTER — Ambulatory Visit: Payer: 59

## 2022-05-18 DIAGNOSIS — N3000 Acute cystitis without hematuria: Secondary | ICD-10-CM | POA: Diagnosis not present

## 2022-05-19 DIAGNOSIS — N3000 Acute cystitis without hematuria: Secondary | ICD-10-CM | POA: Diagnosis not present

## 2022-05-19 DIAGNOSIS — Z4659 Encounter for fitting and adjustment of other gastrointestinal appliance and device: Secondary | ICD-10-CM | POA: Diagnosis not present

## 2022-05-20 DIAGNOSIS — N3000 Acute cystitis without hematuria: Secondary | ICD-10-CM | POA: Diagnosis not present

## 2022-05-21 DIAGNOSIS — Z86011 Personal history of benign neoplasm of the brain: Secondary | ICD-10-CM | POA: Diagnosis not present

## 2022-05-21 DIAGNOSIS — E860 Dehydration: Secondary | ICD-10-CM | POA: Diagnosis not present

## 2022-05-21 DIAGNOSIS — R531 Weakness: Secondary | ICD-10-CM | POA: Diagnosis not present

## 2022-05-21 DIAGNOSIS — Z7189 Other specified counseling: Secondary | ICD-10-CM | POA: Diagnosis not present

## 2022-05-21 DIAGNOSIS — N3000 Acute cystitis without hematuria: Secondary | ICD-10-CM | POA: Diagnosis not present

## 2022-05-22 DIAGNOSIS — N3000 Acute cystitis without hematuria: Secondary | ICD-10-CM | POA: Diagnosis not present

## 2022-05-22 DIAGNOSIS — Z7189 Other specified counseling: Secondary | ICD-10-CM | POA: Diagnosis not present

## 2022-05-22 DIAGNOSIS — R531 Weakness: Secondary | ICD-10-CM | POA: Diagnosis not present

## 2022-05-22 DIAGNOSIS — R569 Unspecified convulsions: Secondary | ICD-10-CM | POA: Diagnosis not present

## 2022-05-22 DIAGNOSIS — Z86011 Personal history of benign neoplasm of the brain: Secondary | ICD-10-CM | POA: Diagnosis not present

## 2022-05-23 DIAGNOSIS — R531 Weakness: Secondary | ICD-10-CM | POA: Diagnosis not present

## 2022-05-23 DIAGNOSIS — N3000 Acute cystitis without hematuria: Secondary | ICD-10-CM | POA: Diagnosis not present

## 2022-05-23 DIAGNOSIS — Z86011 Personal history of benign neoplasm of the brain: Secondary | ICD-10-CM | POA: Diagnosis not present

## 2022-05-23 DIAGNOSIS — Z7189 Other specified counseling: Secondary | ICD-10-CM | POA: Diagnosis not present

## 2022-05-29 ENCOUNTER — Telehealth: Payer: Self-pay | Admitting: Family Medicine

## 2022-05-29 DIAGNOSIS — N3000 Acute cystitis without hematuria: Secondary | ICD-10-CM | POA: Diagnosis not present

## 2022-05-29 NOTE — Telephone Encounter (Signed)
Contacted Taylor [name] to schedule their annual wellness visit. Patient declined to schedule AWV at this time. Patient is in the hospital.   [facility] Guide [facility] AWV TEAM Direct Dial: [phone]

## 2022-06-14 DIAGNOSIS — R42 Dizziness and giddiness: Secondary | ICD-10-CM | POA: Diagnosis not present

## 2022-06-14 DIAGNOSIS — Z96659 Presence of unspecified artificial knee joint: Secondary | ICD-10-CM | POA: Diagnosis not present

## 2022-06-14 DIAGNOSIS — R269 Unspecified abnormalities of gait and mobility: Secondary | ICD-10-CM | POA: Diagnosis not present

## 2022-06-14 DIAGNOSIS — R29818 Other symptoms and signs involving the nervous system: Secondary | ICD-10-CM | POA: Diagnosis not present

## 2022-06-20 DEATH — deceased

## 2022-07-21 DEATH — deceased

## 2022-08-31 ENCOUNTER — Encounter (INDEPENDENT_AMBULATORY_CARE_PROVIDER_SITE_OTHER): Payer: Medicare Other | Admitting: Ophthalmology

## 2023-06-15 IMAGING — CT CT CERVICAL SPINE W/O CM
3 of 4 series · 12 of 33 positions shown, 14 images · non-contrast
Comparison: None.

CLINICAL DATA: Neck trauma, fall last night onto right side.

EXAM:
CT CERVICAL SPINE WITHOUT CONTRAST
TECHNIQUE: Multidetector CT imaging of the cervical spine was performed without
intravenous contrast. Multiplanar CT image reconstructions were also
generated.

[Series 4: sagittal bone · sagittal · 0.25mm/px · 5 of 61 slices shown, 6 images]
[im 21/61  bone]
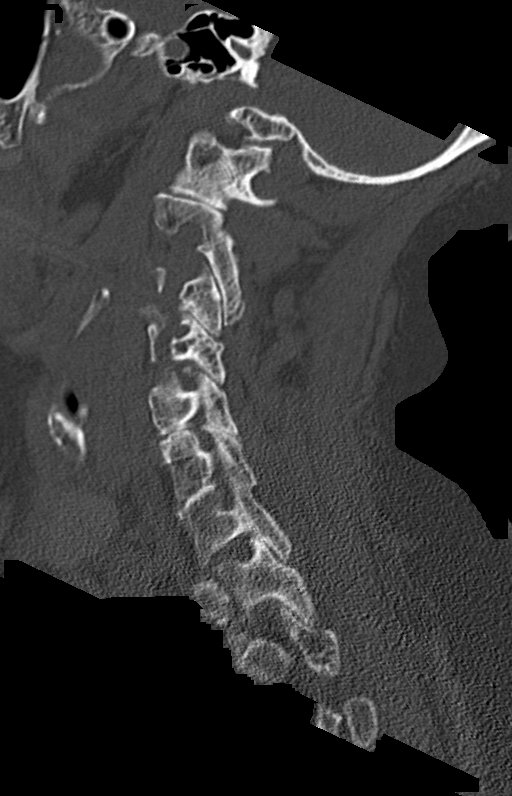
[im 26/61  bone]
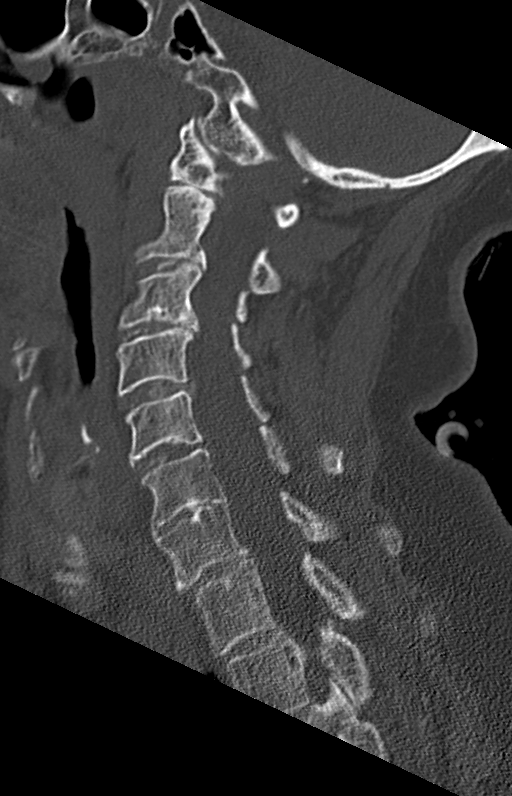
[im 31/61  soft-tissue]
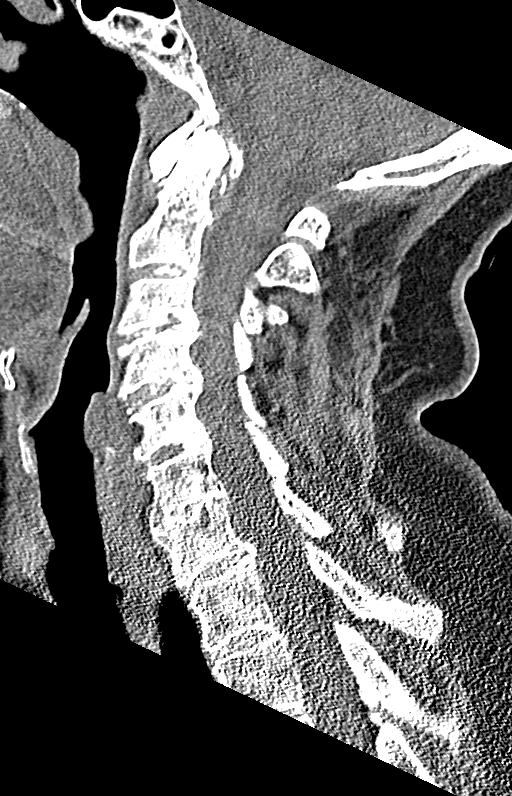
[im 31/61  bone]
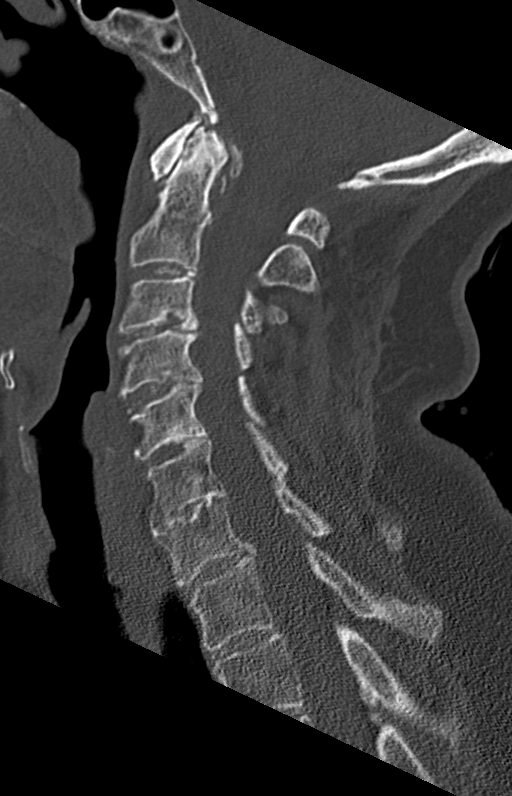
[im 36/61  bone]
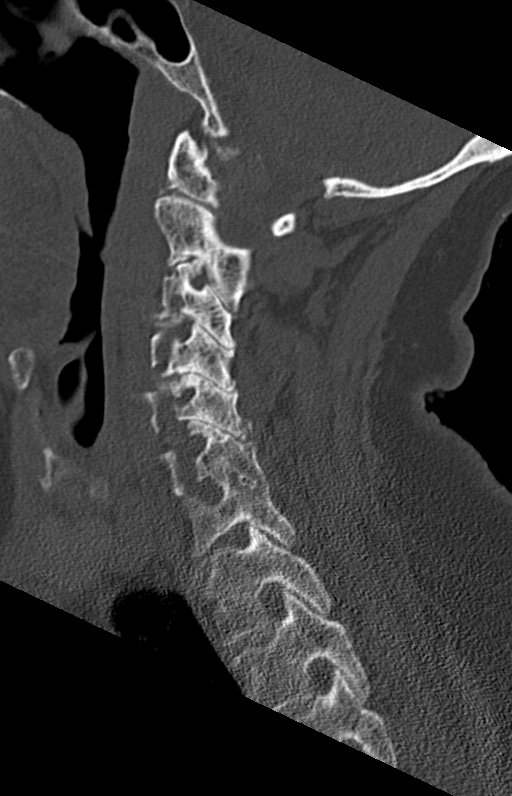
[im 41/61  bone]
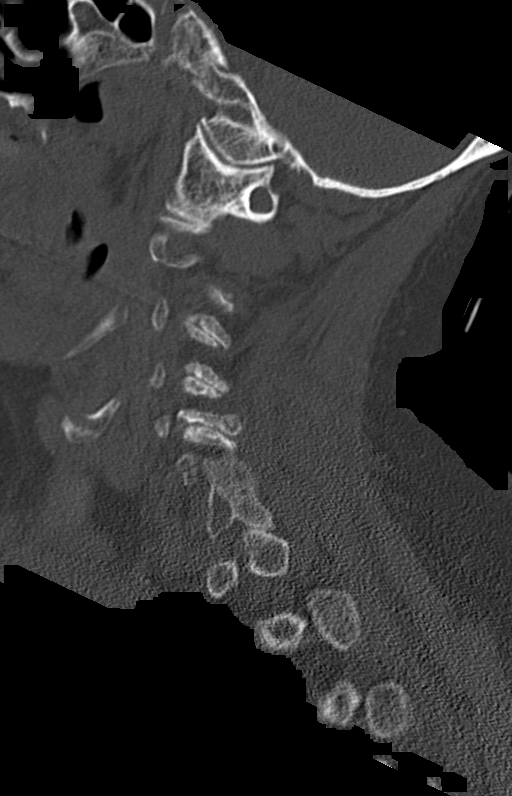

[Series 5: coronal bone · coronal · 0.27mm/px · 3 of 67 slices shown]
[im 19/67  bone]
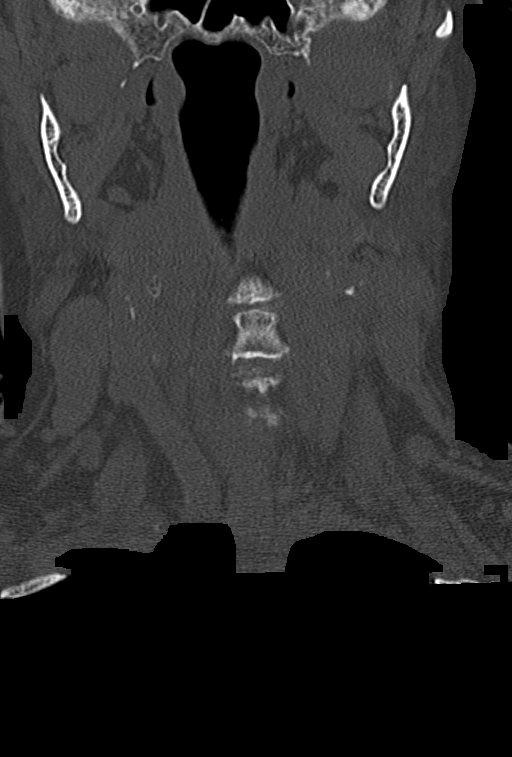
[im 29/67  bone]
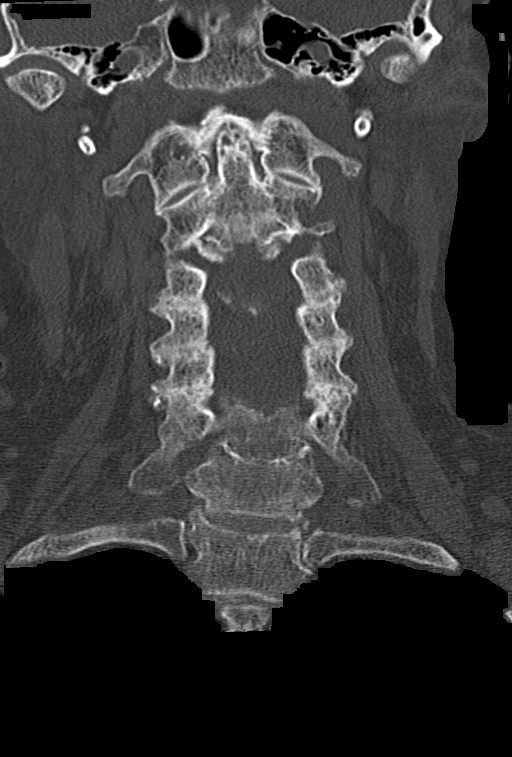
[im 39/67  bone]
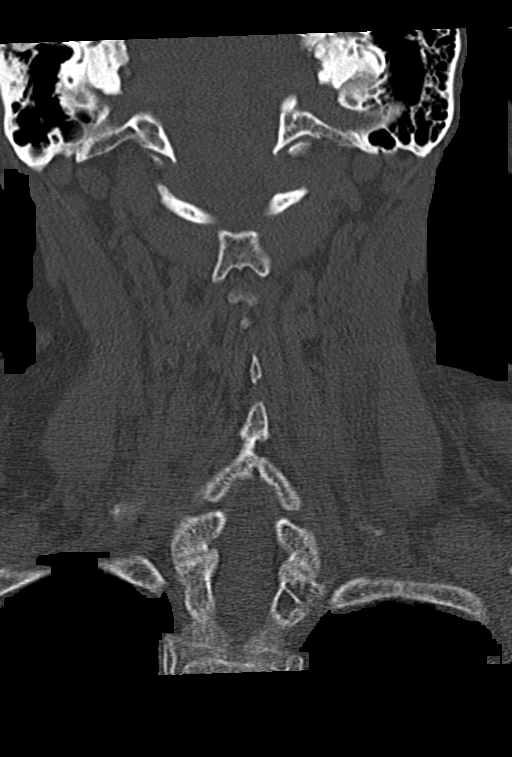

[Series 6: orthogonal bone · axial · 0.27mm/px · z∈[-325,-210]mm · 4 of 97 slices shown, 5 images]
[im 14/97  soft-tissue]
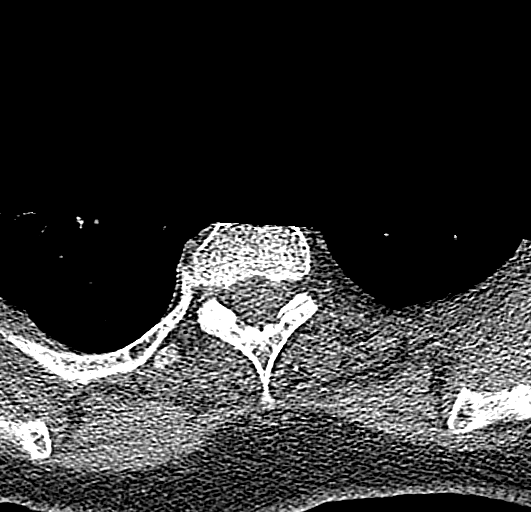
[im 14/97  bone]
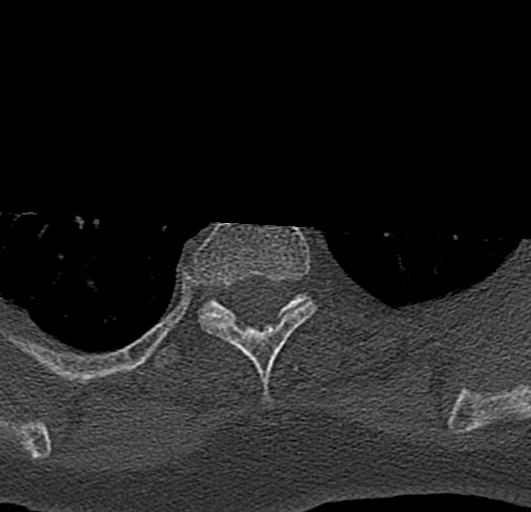
[im 42/97  bone]
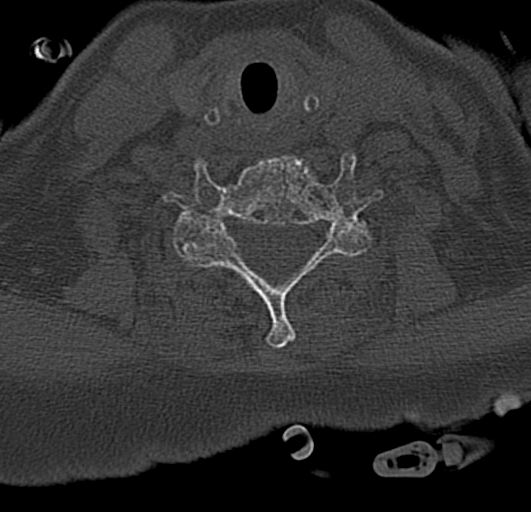
[im 55/97  bone]
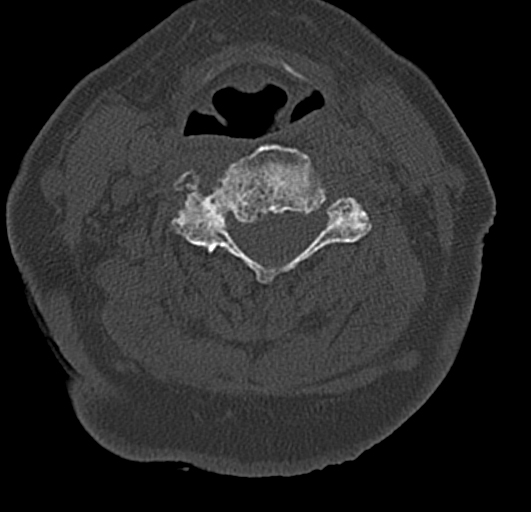
[im 83/97  bone]
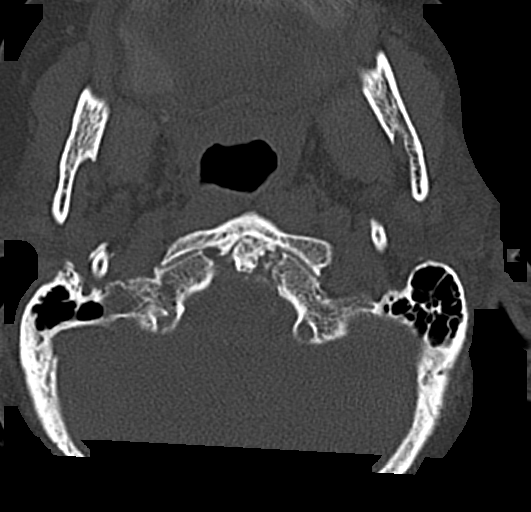

[12 of 33 positions shown; findings below may reference images not displayed]

FINDINGS: Alignment: No traumatic subluxation. There is minimal broad-based
levo scoliotic curvature.

Skull base and vertebrae: No acute fracture. Vertebral body heights
are maintained. The dens and skull base are intact. Moderate C1-C2
degenerative change with pannus formation. Non fusion of the
posterior elements of C6, variant anatomy.

Soft tissues and spinal canal: No prevertebral fluid or swelling. No
visible canal hematoma.

Disc levels: Diffuse degenerative disc disease, most prominently
affecting C3-C4 where there is mild mass effect on the spinal canal.
Multilevel facet hypertrophy. Multilevel neural foraminal stenosis.

Upper chest: Nonacute.

Other: Carotid calcifications.
IMPRESSION: 1. No acute fracture or subluxation of the cervical spine.
2. Multilevel degenerative disc disease and facet hypertrophy.

## 2023-06-15 IMAGING — CR DG HIP (WITH OR WITHOUT PELVIS) 2-3V*R*
3 series · 3 of 3 positions shown · non-contrast
Comparison: None.

CLINICAL DATA: Recent fall with right hip pain, initial encounter

EXAM:
DG HIP (WITH OR WITHOUT PELVIS) 3V RIGHT

[pelvis ap]
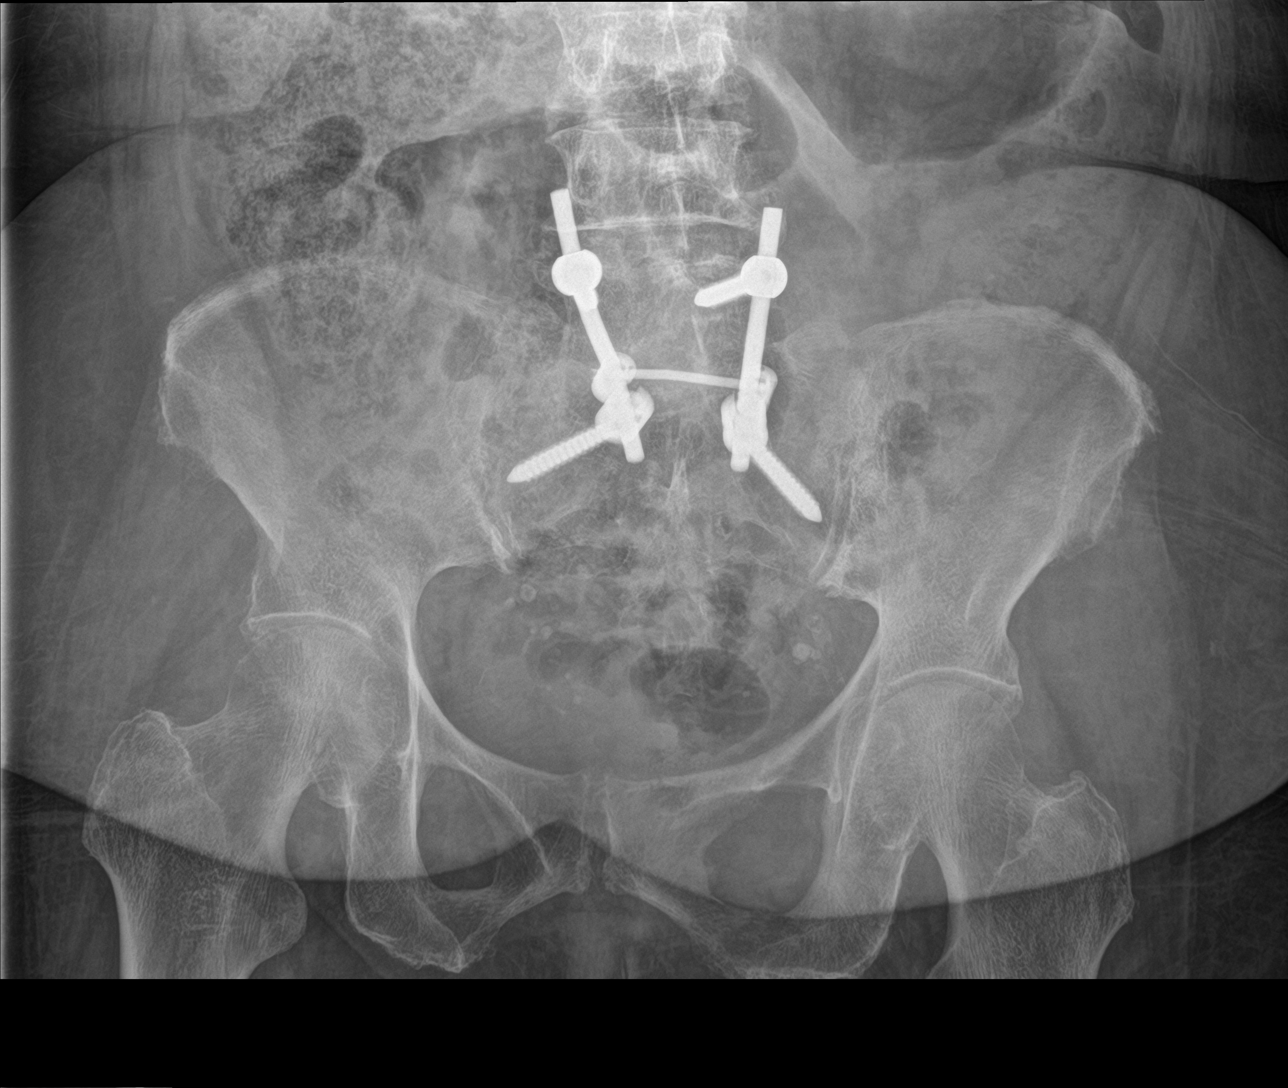

[hip ap]
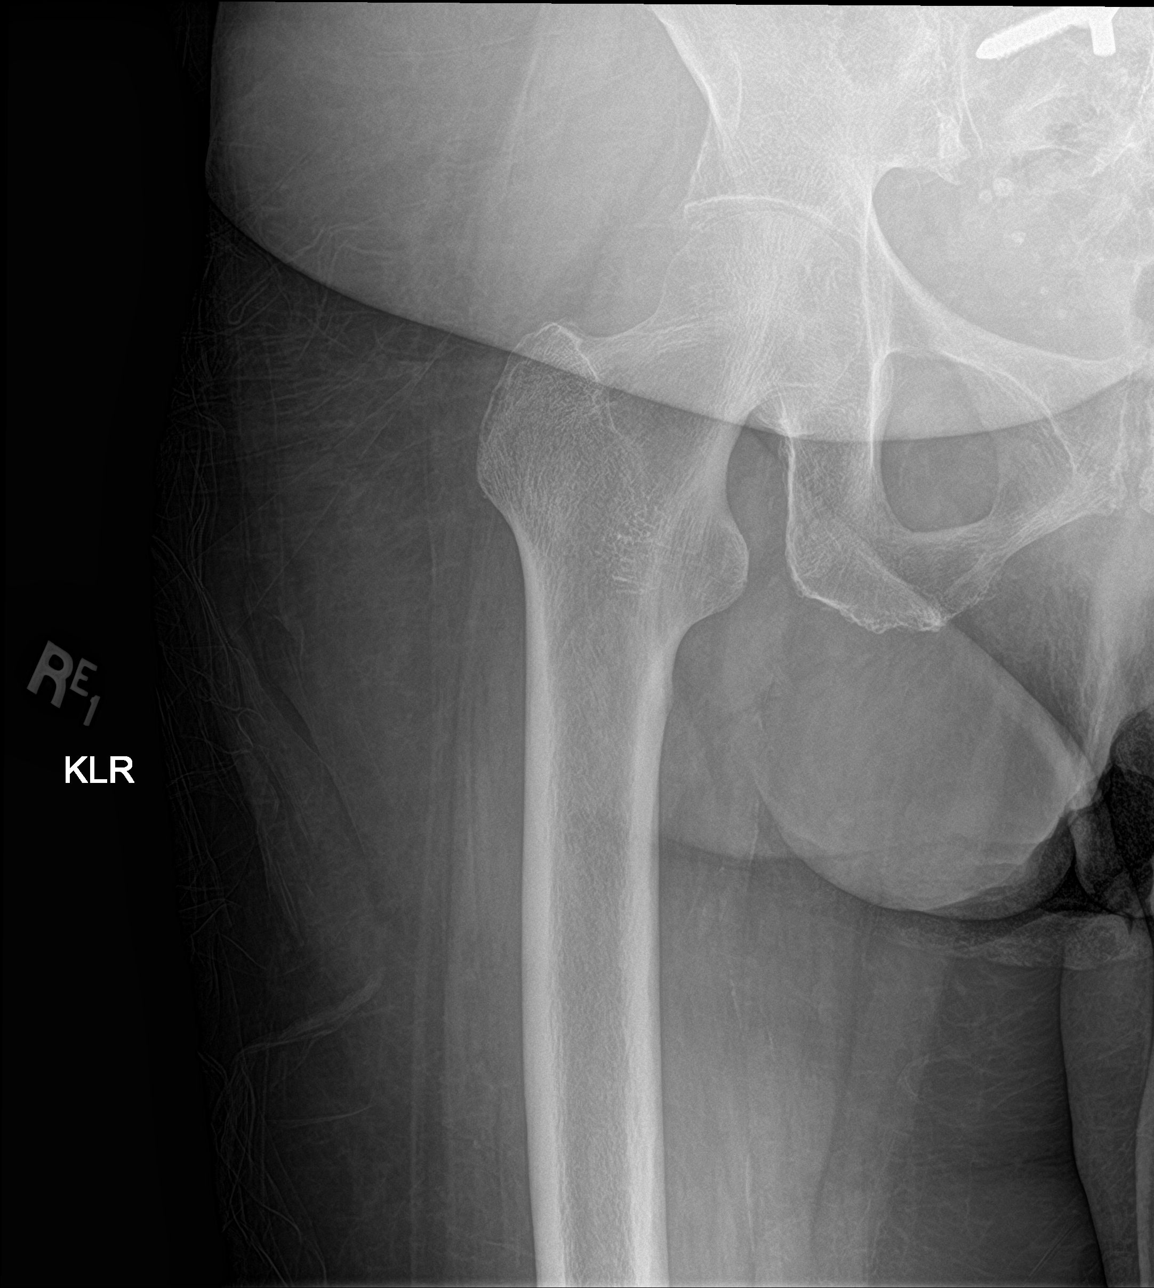

[hip lat]
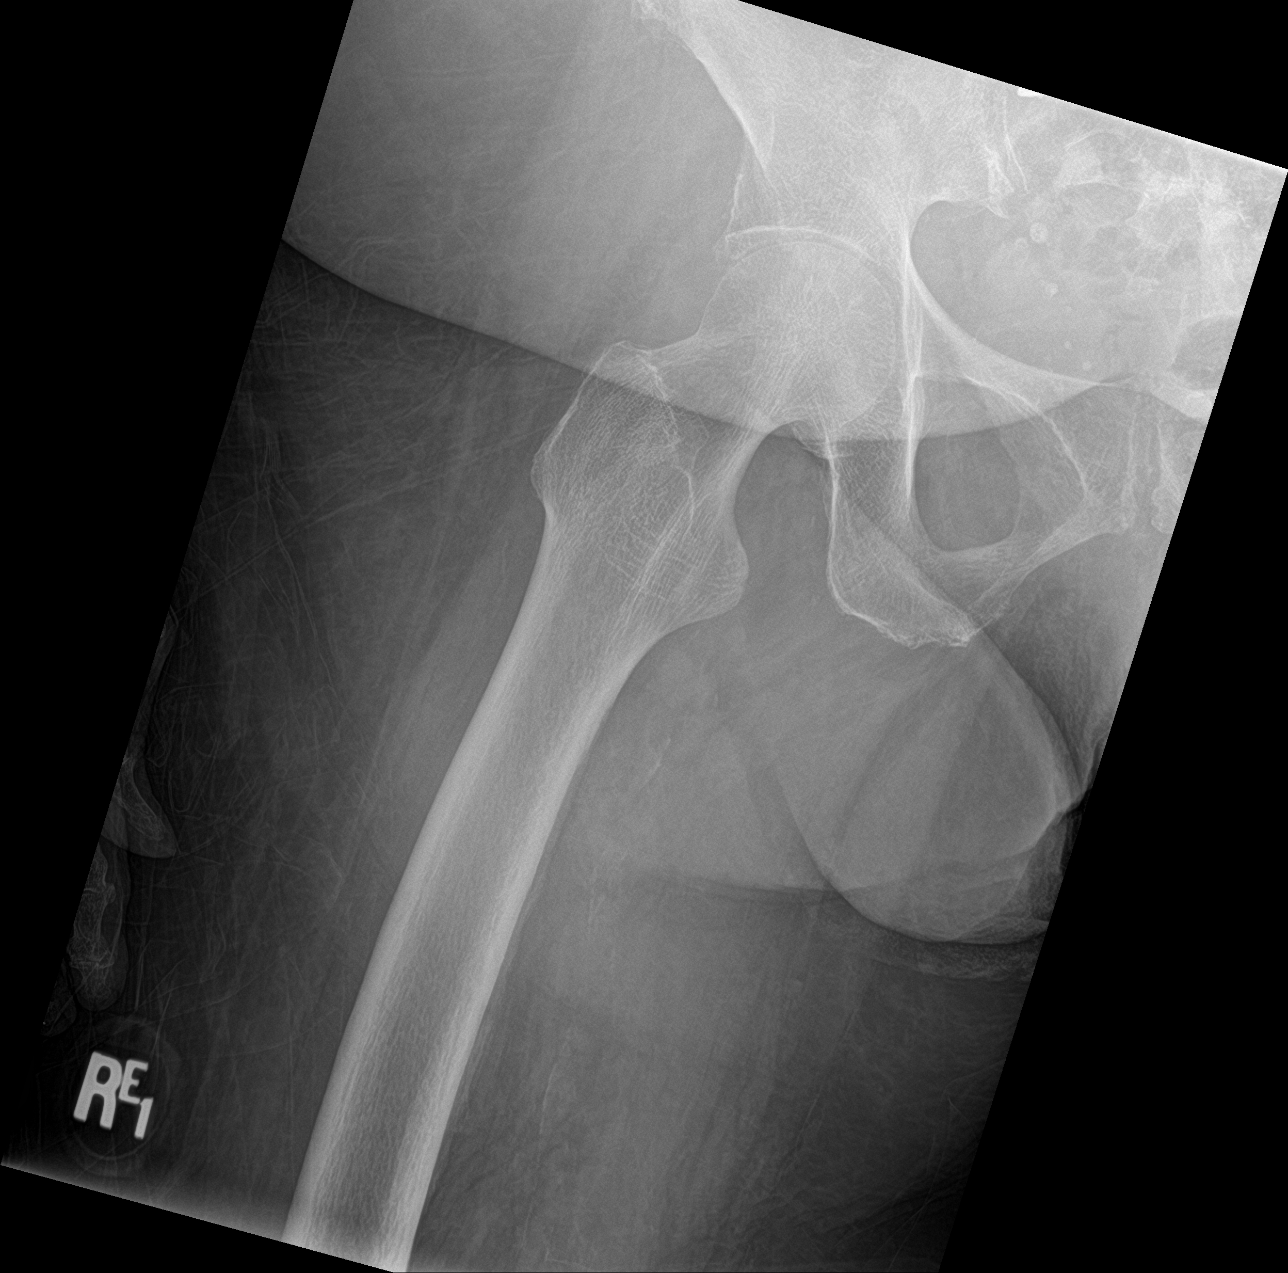

[3 of 3 positions shown; findings below may reference images not displayed]

FINDINGS: Postsurgical changes are noted at the lumbosacral junction. Pelvic
ring is intact. No acute fracture or dislocation is noted. No soft
tissue abnormality is seen.
IMPRESSION: No acute abnormality noted.
